# Patient Record
Sex: Male | Born: 1955 | Race: Black or African American | Hispanic: No | Marital: Single | Smoking: Former smoker
Health system: Southern US, Community
[De-identification: ages and names within clinical notes are randomized; demographics above are authoritative.]

## PROBLEM LIST (undated history)

## (undated) DIAGNOSIS — K5900 Constipation, unspecified: Secondary | ICD-10-CM

## (undated) DIAGNOSIS — N35912 Unspecified bulbous urethral stricture, male: Secondary | ICD-10-CM

## (undated) DIAGNOSIS — N189 Chronic kidney disease, unspecified: Secondary | ICD-10-CM

## (undated) DIAGNOSIS — I4891 Unspecified atrial fibrillation: Secondary | ICD-10-CM

## (undated) DIAGNOSIS — E119 Type 2 diabetes mellitus without complications: Secondary | ICD-10-CM

## (undated) DIAGNOSIS — K219 Gastro-esophageal reflux disease without esophagitis: Secondary | ICD-10-CM

## (undated) DIAGNOSIS — M545 Low back pain, unspecified: Secondary | ICD-10-CM

## (undated) DIAGNOSIS — R310 Gross hematuria: Secondary | ICD-10-CM

## (undated) DIAGNOSIS — I10 Essential (primary) hypertension: Secondary | ICD-10-CM

## (undated) DIAGNOSIS — I2510 Atherosclerotic heart disease of native coronary artery without angina pectoris: Secondary | ICD-10-CM

## (undated) DIAGNOSIS — R0600 Dyspnea, unspecified: Secondary | ICD-10-CM

## (undated) DIAGNOSIS — J449 Chronic obstructive pulmonary disease, unspecified: Secondary | ICD-10-CM

## (undated) DIAGNOSIS — D649 Anemia, unspecified: Secondary | ICD-10-CM

## (undated) DIAGNOSIS — R002 Palpitations: Secondary | ICD-10-CM

## (undated) DIAGNOSIS — E785 Hyperlipidemia, unspecified: Secondary | ICD-10-CM

## (undated) DIAGNOSIS — N401 Enlarged prostate with lower urinary tract symptoms: Secondary | ICD-10-CM

## (undated) DIAGNOSIS — F0150 Vascular dementia without behavioral disturbance: Secondary | ICD-10-CM

## (undated) DIAGNOSIS — R42 Dizziness and giddiness: Secondary | ICD-10-CM

## (undated) DIAGNOSIS — J45909 Unspecified asthma, uncomplicated: Secondary | ICD-10-CM

## (undated) DIAGNOSIS — F209 Schizophrenia, unspecified: Secondary | ICD-10-CM

## (undated) HISTORY — PX: OTHER SURGICAL HISTORY: SHX169

## (undated) HISTORY — PX: COLONOSCOPY: SHX174

---

## 2003-11-20 HISTORY — PX: CORONARY ANGIOPLASTY WITH STENT PLACEMENT: SHX49

## 2003-12-01 ENCOUNTER — Ambulatory Visit (HOSPITAL_COMMUNITY): Admission: AD | Admit: 2003-12-01 | Discharge: 2003-12-02 | Payer: Self-pay | Admitting: Cardiovascular Disease

## 2005-11-21 ENCOUNTER — Other Ambulatory Visit: Payer: Self-pay

## 2005-11-21 ENCOUNTER — Inpatient Hospital Stay: Payer: Self-pay | Admitting: Internal Medicine

## 2006-08-15 ENCOUNTER — Ambulatory Visit: Payer: Self-pay | Admitting: Cardiovascular Disease

## 2008-07-29 ENCOUNTER — Inpatient Hospital Stay: Payer: Self-pay | Admitting: Psychiatry

## 2009-04-01 ENCOUNTER — Inpatient Hospital Stay: Payer: Self-pay | Admitting: Psychiatry

## 2009-05-19 ENCOUNTER — Inpatient Hospital Stay: Payer: Self-pay | Admitting: Internal Medicine

## 2010-07-11 ENCOUNTER — Ambulatory Visit: Payer: Self-pay | Admitting: Emergency Medicine

## 2014-01-29 ENCOUNTER — Inpatient Hospital Stay: Payer: Self-pay | Admitting: Internal Medicine

## 2014-01-29 LAB — CBC WITH DIFFERENTIAL/PLATELET
Bands: 5 %
Eosinophil: 1 %
HCT: 18 % — ABNORMAL LOW (ref 40.0–52.0)
HGB: 6 g/dL — ABNORMAL LOW (ref 13.0–18.0)
Lymphocytes: 34 %
MCH: 33.7 pg (ref 26.0–34.0)
MCHC: 33.7 g/dL (ref 32.0–36.0)
MCV: 100 fL (ref 80–100)
Monocytes: 9 %
Platelet: 155 10*3/uL (ref 150–440)
RBC: 1.8 10*6/uL — ABNORMAL LOW (ref 4.40–5.90)
RDW: 16.4 % — ABNORMAL HIGH (ref 11.5–14.5)
Segmented Neutrophils: 51 %
WBC: 8.2 10*3/uL (ref 3.8–10.6)

## 2014-01-29 LAB — COMPREHENSIVE METABOLIC PANEL
Albumin: 2.7 g/dL — ABNORMAL LOW (ref 3.4–5.0)
Alkaline Phosphatase: 41 U/L — ABNORMAL LOW
Anion Gap: 4 — ABNORMAL LOW (ref 7–16)
BUN: 34 mg/dL — ABNORMAL HIGH (ref 7–18)
Bilirubin,Total: 0.3 mg/dL (ref 0.2–1.0)
Calcium, Total: 8.3 mg/dL — ABNORMAL LOW (ref 8.5–10.1)
Chloride: 106 mmol/L (ref 98–107)
Co2: 28 mmol/L (ref 21–32)
Creatinine: 1.51 mg/dL — ABNORMAL HIGH (ref 0.60–1.30)
EGFR (African American): 58 — ABNORMAL LOW
EGFR (Non-African Amer.): 50 — ABNORMAL LOW
Glucose: 156 mg/dL — ABNORMAL HIGH (ref 65–99)
Osmolality: 286 (ref 275–301)
Potassium: 4.9 mmol/L (ref 3.5–5.1)
SGOT(AST): 29 U/L (ref 15–37)
SGPT (ALT): 18 U/L (ref 12–78)
Sodium: 138 mmol/L (ref 136–145)
Total Protein: 5.7 g/dL — ABNORMAL LOW (ref 6.4–8.2)

## 2014-01-29 LAB — URINALYSIS, COMPLETE
Bacteria: NONE SEEN
Bilirubin,UR: NEGATIVE
Blood: NEGATIVE
Glucose,UR: NEGATIVE mg/dL (ref 0–75)
Ketone: NEGATIVE
Leukocyte Esterase: NEGATIVE
Nitrite: NEGATIVE
Ph: 7 (ref 4.5–8.0)
Protein: NEGATIVE
RBC,UR: NONE SEEN /HPF (ref 0–5)
Specific Gravity: 1.017 (ref 1.003–1.030)
Squamous Epithelial: <1
WBC UR: 1 /HPF (ref 0–5)

## 2014-01-29 LAB — APTT: Activated PTT: 63.7 secs — ABNORMAL HIGH (ref 23.6–35.9)

## 2014-01-29 LAB — PROTIME-INR
INR: 3.8
Prothrombin Time: 36.2 secs — ABNORMAL HIGH (ref 11.5–14.7)

## 2014-01-30 LAB — CBC WITH DIFFERENTIAL/PLATELET
Bands: 4 %
Eosinophil: 2 %
HCT: 22.5 % — ABNORMAL LOW (ref 40.0–52.0)
HGB: 7.6 g/dL — ABNORMAL LOW (ref 13.0–18.0)
Lymphocytes: 32 %
MCH: 32.5 pg (ref 26.0–34.0)
MCHC: 33.7 g/dL (ref 32.0–36.0)
MCV: 97 fL (ref 80–100)
Metamyelocyte: 1 %
Monocytes: 12 %
NRBC/100 WBC: 4 /
Platelet: 133 10*3/uL — ABNORMAL LOW (ref 150–440)
RBC: 2.33 10*6/uL — ABNORMAL LOW (ref 4.40–5.90)
RDW: 16.9 % — ABNORMAL HIGH (ref 11.5–14.5)
Segmented Neutrophils: 49 %
WBC: 7.3 10*3/uL (ref 3.8–10.6)

## 2014-01-30 LAB — BASIC METABOLIC PANEL
Anion Gap: 4 — ABNORMAL LOW (ref 7–16)
BUN: 22 mg/dL — ABNORMAL HIGH (ref 7–18)
Calcium, Total: 8.4 mg/dL — ABNORMAL LOW (ref 8.5–10.1)
Chloride: 105 mmol/L (ref 98–107)
Co2: 28 mmol/L (ref 21–32)
Creatinine: 1.09 mg/dL (ref 0.60–1.30)
EGFR (African American): >60
EGFR (Non-African Amer.): >60
Glucose: 94 mg/dL (ref 65–99)
Osmolality: 277 (ref 275–301)
Potassium: 4.7 mmol/L (ref 3.5–5.1)
Sodium: 137 mmol/L (ref 136–145)

## 2014-01-30 LAB — PROTIME-INR
INR: 1.4
Prothrombin Time: 17 secs — ABNORMAL HIGH (ref 11.5–14.7)

## 2014-01-31 LAB — CBC WITH DIFFERENTIAL/PLATELET
Bands: 1 %
Eosinophil: 2 %
HCT: 25.1 % — ABNORMAL LOW (ref 40.0–52.0)
HGB: 8.4 g/dL — ABNORMAL LOW (ref 13.0–18.0)
Lymphocytes: 43 %
MCH: 32.9 pg (ref 26.0–34.0)
MCHC: 33.4 g/dL (ref 32.0–36.0)
MCV: 98 fL (ref 80–100)
Monocytes: 10 %
NRBC/100 WBC: 2 /
Platelet: 160 10*3/uL (ref 150–440)
RBC: 2.56 10*6/uL — ABNORMAL LOW (ref 4.40–5.90)
RDW: 17.4 % — ABNORMAL HIGH (ref 11.5–14.5)
Segmented Neutrophils: 44 %
WBC: 7.6 10*3/uL (ref 3.8–10.6)

## 2014-01-31 LAB — COMPREHENSIVE METABOLIC PANEL
Albumin: 3 g/dL — ABNORMAL LOW (ref 3.4–5.0)
Alkaline Phosphatase: 39 U/L — ABNORMAL LOW
Anion Gap: 3 — ABNORMAL LOW (ref 7–16)
BUN: 11 mg/dL (ref 7–18)
Bilirubin,Total: 0.6 mg/dL (ref 0.2–1.0)
Calcium, Total: 8.6 mg/dL (ref 8.5–10.1)
Chloride: 107 mmol/L (ref 98–107)
Co2: 29 mmol/L (ref 21–32)
Creatinine: 1.09 mg/dL (ref 0.60–1.30)
EGFR (African American): >60
EGFR (Non-African Amer.): >60
Glucose: 88 mg/dL (ref 65–99)
Osmolality: 276 (ref 275–301)
Potassium: 4.8 mmol/L (ref 3.5–5.1)
SGOT(AST): 22 U/L (ref 15–37)
SGPT (ALT): 19 U/L (ref 12–78)
Sodium: 139 mmol/L (ref 136–145)
Total Protein: 6.3 g/dL — ABNORMAL LOW (ref 6.4–8.2)

## 2014-02-01 LAB — COMPREHENSIVE METABOLIC PANEL
Albumin: 2.8 g/dL — ABNORMAL LOW (ref 3.4–5.0)
Alkaline Phosphatase: 40 U/L — ABNORMAL LOW
Anion Gap: 6 — ABNORMAL LOW (ref 7–16)
BUN: 11 mg/dL (ref 7–18)
Bilirubin,Total: 0.5 mg/dL (ref 0.2–1.0)
Calcium, Total: 8.4 mg/dL — ABNORMAL LOW (ref 8.5–10.1)
Chloride: 107 mmol/L (ref 98–107)
Co2: 26 mmol/L (ref 21–32)
Creatinine: 1.32 mg/dL — ABNORMAL HIGH (ref 0.60–1.30)
EGFR (African American): >60
EGFR (Non-African Amer.): 59 — ABNORMAL LOW
Glucose: 126 mg/dL — ABNORMAL HIGH (ref 65–99)
Osmolality: 278 (ref 275–301)
Potassium: 3.9 mmol/L (ref 3.5–5.1)
SGOT(AST): 25 U/L (ref 15–37)
SGPT (ALT): 23 U/L (ref 12–78)
Sodium: 139 mmol/L (ref 136–145)
Total Protein: 6.1 g/dL — ABNORMAL LOW (ref 6.4–8.2)

## 2014-02-01 LAB — PROTIME-INR
INR: 1.1
Prothrombin Time: 14.1 secs (ref 11.5–14.7)

## 2014-02-01 LAB — CBC WITH DIFFERENTIAL/PLATELET
Basophil #: 0 10*3/uL (ref 0.0–0.1)
Basophil %: 0.4 %
Eosinophil #: 0.2 10*3/uL (ref 0.0–0.7)
Eosinophil %: 2.1 %
HCT: 30.3 % — ABNORMAL LOW (ref 40.0–52.0)
HGB: 10 g/dL — ABNORMAL LOW (ref 13.0–18.0)
Lymphocyte #: 2.3 10*3/uL (ref 1.0–3.6)
Lymphocyte %: 31.4 %
MCH: 32.7 pg (ref 26.0–34.0)
MCHC: 33.2 g/dL (ref 32.0–36.0)
MCV: 98 fL (ref 80–100)
Monocyte #: 0.9 x10 3/mm (ref 0.2–1.0)
Monocyte %: 12.4 %
Neutrophil #: 3.9 10*3/uL (ref 1.4–6.5)
Neutrophil %: 53.7 %
Platelet: 184 10*3/uL (ref 150–440)
RBC: 3.07 10*6/uL — ABNORMAL LOW (ref 4.40–5.90)
RDW: 17.4 % — ABNORMAL HIGH (ref 11.5–14.5)
WBC: 7.2 10*3/uL (ref 3.8–10.6)

## 2014-02-02 LAB — BASIC METABOLIC PANEL
Anion Gap: 4 — ABNORMAL LOW (ref 7–16)
BUN: 20 mg/dL — ABNORMAL HIGH (ref 7–18)
Calcium, Total: 8.3 mg/dL — ABNORMAL LOW (ref 8.5–10.1)
Chloride: 105 mmol/L (ref 98–107)
Co2: 28 mmol/L (ref 21–32)
Creatinine: 1.39 mg/dL — ABNORMAL HIGH (ref 0.60–1.30)
EGFR (African American): >60
EGFR (Non-African Amer.): 55 — ABNORMAL LOW
Glucose: 96 mg/dL (ref 65–99)
Osmolality: 276 (ref 275–301)
Potassium: 4.1 mmol/L (ref 3.5–5.1)
Sodium: 137 mmol/L (ref 136–145)

## 2014-02-02 LAB — CBC WITH DIFFERENTIAL/PLATELET
Basophil #: 0 10*3/uL (ref 0.0–0.1)
Basophil %: 0.3 %
Eosinophil #: 0.1 10*3/uL (ref 0.0–0.7)
Eosinophil %: 2 %
HCT: 28.1 % — ABNORMAL LOW (ref 40.0–52.0)
HGB: 9.3 g/dL — ABNORMAL LOW (ref 13.0–18.0)
Lymphocyte #: 2.2 10*3/uL (ref 1.0–3.6)
Lymphocyte %: 31.8 %
MCH: 32.8 pg (ref 26.0–34.0)
MCHC: 33.3 g/dL (ref 32.0–36.0)
MCV: 99 fL (ref 80–100)
Monocyte #: 0.8 x10 3/mm (ref 0.2–1.0)
Monocyte %: 11.8 %
Neutrophil #: 3.7 10*3/uL (ref 1.4–6.5)
Neutrophil %: 54.1 %
Platelet: 176 10*3/uL (ref 150–440)
RBC: 2.85 10*6/uL — ABNORMAL LOW (ref 4.40–5.90)
RDW: 18.1 % — ABNORMAL HIGH (ref 11.5–14.5)
WBC: 6.9 10*3/uL (ref 3.8–10.6)

## 2014-02-02 LAB — PROTIME-INR
INR: 1.1
Prothrombin Time: 14.2 secs (ref 11.5–14.7)

## 2014-02-03 LAB — CBC WITH DIFFERENTIAL/PLATELET
Basophil #: 0 10*3/uL (ref 0.0–0.1)
Basophil %: 0.4 %
Eosinophil #: 0.2 10*3/uL (ref 0.0–0.7)
Eosinophil %: 3.1 %
HCT: 25.7 % — ABNORMAL LOW (ref 40.0–52.0)
HGB: 8.5 g/dL — ABNORMAL LOW (ref 13.0–18.0)
Lymphocyte #: 2.6 10*3/uL (ref 1.0–3.6)
Lymphocyte %: 43.2 %
MCH: 32.4 pg (ref 26.0–34.0)
MCHC: 32.9 g/dL (ref 32.0–36.0)
MCV: 99 fL (ref 80–100)
Monocyte #: 0.8 x10 3/mm (ref 0.2–1.0)
Monocyte %: 13.2 %
Neutrophil #: 2.4 10*3/uL (ref 1.4–6.5)
Neutrophil %: 40.1 %
Platelet: 161 10*3/uL (ref 150–440)
RBC: 2.61 10*6/uL — ABNORMAL LOW (ref 4.40–5.90)
RDW: 18.1 % — ABNORMAL HIGH (ref 11.5–14.5)
WBC: 6 10*3/uL (ref 3.8–10.6)

## 2014-02-03 LAB — PROTIME-INR
INR: 1.3
Prothrombin Time: 16.4 secs — ABNORMAL HIGH (ref 11.5–14.7)

## 2014-02-04 LAB — CBC WITH DIFFERENTIAL/PLATELET
Basophil #: 0 10*3/uL (ref 0.0–0.1)
Basophil %: 0.6 %
Eosinophil #: 0.2 10*3/uL (ref 0.0–0.7)
Eosinophil %: 2.5 %
HCT: 31.6 % — ABNORMAL LOW (ref 40.0–52.0)
HGB: 10.4 g/dL — ABNORMAL LOW (ref 13.0–18.0)
Lymphocyte #: 3.8 10*3/uL — ABNORMAL HIGH (ref 1.0–3.6)
Lymphocyte %: 53.2 %
MCH: 32.4 pg (ref 26.0–34.0)
MCHC: 32.9 g/dL (ref 32.0–36.0)
MCV: 99 fL (ref 80–100)
Monocyte #: 0.8 x10 3/mm (ref 0.2–1.0)
Monocyte %: 10.7 %
Neutrophil #: 2.3 10*3/uL (ref 1.4–6.5)
Neutrophil %: 33 %
Platelet: 192 10*3/uL (ref 150–440)
RBC: 3.21 10*6/uL — ABNORMAL LOW (ref 4.40–5.90)
RDW: 18.2 % — ABNORMAL HIGH (ref 11.5–14.5)
WBC: 7 10*3/uL (ref 3.8–10.6)

## 2014-02-04 LAB — PROTIME-INR
INR: 1.5
Prothrombin Time: 17.4 secs — ABNORMAL HIGH (ref 11.5–14.7)

## 2014-02-05 LAB — CBC WITH DIFFERENTIAL/PLATELET
Basophil #: 0 10*3/uL (ref 0.0–0.1)
Basophil %: 0.5 %
Eosinophil #: 0.1 10*3/uL (ref 0.0–0.7)
Eosinophil %: 2.1 %
HCT: 32 % — ABNORMAL LOW (ref 40.0–52.0)
HGB: 10.5 g/dL — ABNORMAL LOW (ref 13.0–18.0)
Lymphocyte #: 3 10*3/uL (ref 1.0–3.6)
Lymphocyte %: 48.4 %
MCH: 32.6 pg (ref 26.0–34.0)
MCHC: 32.9 g/dL (ref 32.0–36.0)
MCV: 99 fL (ref 80–100)
Monocyte #: 0.7 x10 3/mm (ref 0.2–1.0)
Monocyte %: 11.1 %
Neutrophil #: 2.3 10*3/uL (ref 1.4–6.5)
Neutrophil %: 37.9 %
Platelet: 192 10*3/uL (ref 150–440)
RBC: 3.24 10*6/uL — ABNORMAL LOW (ref 4.40–5.90)
RDW: 18.2 % — ABNORMAL HIGH (ref 11.5–14.5)
WBC: 6.2 10*3/uL (ref 3.8–10.6)

## 2014-02-05 LAB — PROTIME-INR
INR: 1.4
Prothrombin Time: 17 secs — ABNORMAL HIGH (ref 11.5–14.7)

## 2014-02-08 LAB — PATHOLOGY REPORT

## 2015-02-14 ENCOUNTER — Ambulatory Visit: Payer: Self-pay | Admitting: Urology

## 2015-02-14 LAB — CBC WITH DIFFERENTIAL/PLATELET
Basophil #: 0 10*3/uL (ref 0.0–0.1)
Basophil %: 0.5 %
Eosinophil #: 0.1 10*3/uL (ref 0.0–0.7)
Eosinophil %: 2.5 %
HCT: 29.6 % — ABNORMAL LOW (ref 40.0–52.0)
HGB: 9.4 g/dL — ABNORMAL LOW (ref 13.0–18.0)
Lymphocyte #: 2.8 10*3/uL (ref 1.0–3.6)
Lymphocyte %: 48.2 %
MCH: 32.2 pg (ref 26.0–34.0)
MCHC: 31.8 g/dL — ABNORMAL LOW (ref 32.0–36.0)
MCV: 101 fL — ABNORMAL HIGH (ref 80–100)
Monocyte #: 0.5 x10 3/mm (ref 0.2–1.0)
Monocyte %: 9.3 %
Neutrophil #: 2.3 10*3/uL (ref 1.4–6.5)
Neutrophil %: 39.5 %
Platelet: 106 10*3/uL — ABNORMAL LOW (ref 150–440)
RBC: 2.92 10*6/uL — ABNORMAL LOW (ref 4.40–5.90)
RDW: 15.3 % — ABNORMAL HIGH (ref 11.5–14.5)
WBC: 5.9 10*3/uL (ref 3.8–10.6)

## 2015-02-14 LAB — BASIC METABOLIC PANEL
Anion Gap: 4 — ABNORMAL LOW (ref 7–16)
BUN: 33 mg/dL — ABNORMAL HIGH
Calcium, Total: 9 mg/dL
Chloride: 104 mmol/L
Co2: 30 mmol/L
Creatinine: 1.12 mg/dL
EGFR (African American): >60
EGFR (Non-African Amer.): >60
Glucose: 86 mg/dL
Potassium: 4.3 mmol/L
Sodium: 138 mmol/L

## 2015-02-23 ENCOUNTER — Ambulatory Visit
Admit: 2015-02-23 | Disposition: A | Discharge status: Home / Self Care | Payer: Self-pay | Attending: Urology | Admitting: Urology

## 2015-03-12 NOTE — Consult Note (Signed)
Pt seen and examined. Full consult to follow. Admitted with weakness/dizziness. Abd pain with melena and drop in hgb. Coagulapathic from coumadin. Questionable CVA but CT angiography neg. Pt given Vit K yest and INR down to 1.4. Continue to hold coumadin, give PPI daily, and moniter hgb. Since patient will eventually require reinstitution of coumadin, plan EGD on Monday AM. thanks.   Electronic Signatures: Lutricia Feilh, Paul (MD) (Signed on 14-Mar-15 09:53)  Authored   Last Updated: 14-Mar-15 09:56 by Lutricia Feilh, Paul (MD)

## 2015-03-12 NOTE — Discharge Summary (Signed)
PATIENT NAME:  Ricki MillerJONES, Jefferey H MR#:  [id number] DATE OF BIRTH:  Jul 26, 1956  DATE OF ADMISSION:  01/29/2014 DATE OF DISCHARGE:  02/05/2014  PROCEDURES:  1. Colonoscopy, Dr. Bluford Kaufmannh.  2. Upper gastrointestinal endoscopy, Dr. Bluford Kaufmannh.   CONSULTATIONS:  1. Gastroenterology, Dr. Lutricia FeilPaul Oh.  2. Cardiology, Dr. Adrian BlackwaterShaukat Khan.   IMAGING: CT scan of the abdomen, 01/29/2014.  Chest x-ray, 01/31/2014.   HISTORY: This pleasant gentleman was admitted through the Emergency Room where he presented with confusion and generalized weakness. He was found to have severe anemia due to acute blood loss, also complains of melena, abdominal pain. Please see history and physical for full details. His INR was slightly super therapeutic at 3.8. Was admitted to the medical floor and was transfused with 2 units of packed red cells, following which his hemoglobin rose appropriately and remained stable, subsequently. A gastroenterology consultation was placed with Dr. Bluford Kaufmannh, who performed upper and lower GI endoscopies, neither of which identified a source of bleeding. In the case of colonoscopy, the patient had a single polypectomy done. The patient has a history of chronic atrial fibrillation, which remained fairly well controlled during his stay here. Cardiology was consulted, namely Dr. Adrian BlackwaterShaukat Khan without any management changes made. The patient requires long-term anticoagulation for his atrial fibrillation, this was resumed following his colonoscopy, and he is discharged on Lovenox with a Coumadin bridge. The patient's stay was otherwise uncomplicated and is discharged to home in satisfactory condition.   DIET: Low-sodium, ADA diet.   ACTIVITY: As tolerated.   FOLLOWUP: One to 2 weeks with Dr. Ellsworth Lennoxejan-Sie.   DISCHARGE MEDICATIONS: Please refer to medical reconciliation that was completed and reviewed by me. Additional medications are Enoxaparin 110 mg subcutaneously every 12 hours. He is to resume his previous dose of warfarin.    DISCHARGE PROCESS TIME SPENT: 35 minutes.   ____________________________ Silas FloodSheikh A. Ellsworth Lennoxejan-Sie, MD sat:sg D: 02/23/2014 13:27:41 ET T: 02/23/2014 14:17:31 ET JOB#: [id number]  cc: Sheikh A. Ellsworth Lennoxejan-Sie, MD, <Dictator> Charlesetta GaribaldiSHEIKH A TEJAN-SIE MD ELECTRONICALLY SIGNED 03/03/2014 17:53

## 2015-03-12 NOTE — Consult Note (Signed)
PATIENT NAME:  Brian MillerJONES, Aaren H MR#:  [id number] DATE OF BIRTH:  02/27/1956  DATE OF CONSULTATION:  02/04/2014  CONSULTING PHYSICIAN:  Laurier NancyShaukat A. Khan, MD  INDICATION FOR CONSULTATION: Bradycardia.   This is a 59 year old African American male with a history of schizophrenia, coronary artery disease, diabetes, hypertension, who has been on Coumadin because of atrial fibrillation, who presented to the Emergency Room with confusion and weakness and was found to have gastrointestinal bleed. He is right now, denies any chest pain, shortness of breath, PND, orthopnea, or palpitations or fluttering. I was asked to evaluate the patient because the patient had sinus bradycardia.   PAST MEDICAL HISTORY:  1. History of atrial fibrillation.  2. Hypertension. 3. Schizophrenia.  4. History of coronary artery disease, status post PCI and stenting in 2003.  5. He also has a history of acid reflux.  6. Diabetes.   MEDICATIONS: He was on aspirin, amiodarone and Coumadin and metformin and ramipril.   ALLERGIES: None.   SOCIAL HISTORY: Unremarkable.   FAMILY HISTORY: Unremarkable except for diabetes.   PHYSICAL EXAMINATION: GENERAL: He is alert, oriented x 3, in no acute distress right now.  VITAL SIGNS: Stable.  NECK: No JVD.  LUNGS: Clear.  HEART: Regular rate and rhythm. Normal S1, S2. No audible murmur.  ABDOMEN: Soft, nontender, positive bowel sounds.   EXTREMITIES: No pedal edema.   His pulse right now is 73.   EKG shows normal sinus rhythm. No acute changes.   ASSESSMENT AND PLAN: The patient had sinus bradycardia that has resolved with decreasing Toprol or metoprolol from 50 to 25. Advise changing the amiodarone to 200 a day. also. Agree with changes made and may continue just aspirin instead of Coumadin in the future for prophylaxis of atrial fibrillation. We will follow with you. Thank you very much for the referral.   ____________________________ Laurier NancyShaukat A. Khan,  MD sak:sg D: 02/04/2014 09:40:01 ET T: 02/04/2014 10:01:37 ET JOB#: [id number]  cc: Laurier NancyShaukat A. Khan, MD, <Dictator> Laurier NancySHAUKAT A KHAN MD ELECTRONICALLY SIGNED 03/03/2014 13:56

## 2015-03-12 NOTE — Consult Note (Signed)
PATIENT NAME:  Brian Dodson, Brian Dodson MR#:  [id number] DATE OF BIRTH:  1955-12-23  DATE OF CONSULTATION:  01/29/2014  CONSULTING PHYSICIAN:  Ezzard StandingPaul Y. Oh, MD  REASON FOR REFERRAL: Weakness, significant anemia.   DESCRIPTION: The patient is a 59 year old male with a history of schizophrenia, coronary artery disease, diabetes, hypertension, who is on chronic Coumadin, he was brought in with some increasing lethargy and not feeling well and feeling dizzy. Apparently, they saw him with  possible facial droop so he was brought in for possible evaluation for stroke. Upon admission, his hemoglobin was found to be quite low with hemoglobin of only 6.0 and  his INR was elevated at 3.8. He had a CT angiography that ruled out of stroke. I was asked to see the patient because of his anemia.   The patient is a rather poor historian, it is difficult to elicit a good history. However, the patient did admit to feeling lightheaded and dizzy recently. He has been having some vague abdominal pain, although he pointed more to the lower abdomen than upper abdomen. He has noticed melena on and off for the past two weeks or so. The patient was given a blood transfusion overnight which made him feel much better.   REVIEW OF SYSTEMS: There are no fevers or chills, but there is weakness and fatigue. There are no visual or hearing changes. There is no cough or shortness of breath. There is no chest pain or palpitations. There is no nausea, vomiting, but there is abdominal pain. He was heme positive. There is no diarrhea or constipation.  The rest of the review of symptoms is negative.   PAST MEDICAL HISTORY: Notable for schizophrenia, hypertension, coronary artery disease. He had a stent placed in 2003. He has diabetes as well.   MEDICATIONS AT HOME: Coumadin every Tuesday, Thursday and Saturdays with 4 mg and 3 mg Monday, Wednesday, and Friday. He also takes risperidone, Risperdal, Ranitidine 150mg  bid, Ramipril 5 mg daily, metformin  500 mg 2 tablets twice a day, Depakote 4 times a day, hydrochlorothiazide, benztropine 1 mg daily, atorvastatin 10 mg daily, baby aspirin, amiodarone 200 mg daily.   ALLERGIES: He has no known drug allergies.   SOCIAL HISTORY: There is no tobacco or alcohol use.   FAMILY HISTORY: Notable for diabetes.   PAST SURGICAL HISTORY: Notable for cardiac stent.   PHYSICAL EXAMINATION: GENERAL: The patient is in no acute distress right now, he is alert.  HEAD AND NECK: With normal limits.  CARDIOVASCULAR: Somewhat irregular rate. No murmurs.  LUNGS: Clear bilaterally.  ABDOMEN: Shows slight tenderness in the epigastric region. He has active bowel sounds. There is no rebound or guarding. No hepatomegaly.  EXTREMITIES: No edema.  NEUROLOGIC: Examination is nonfocal.  SKIN: Examination is normal.   LABORATORY STUDIES: Again, hemoglobin 6.0, it was 7.6 this morning with blood transfusions. Liver enzymes were normal. Creatinine have been from 1.51 to 1.09 with IV hydration. Glucose is normal today. INR is down to 1.4 after the patient was given vitamin K. Urinalysis was negative.   IMPRESSION/RECOMMENDATIONS: This is a patient with coagulopathy, anemia and heme-positive stool. He has some vague abdominal pain. We have to be concerned about upper GI blood loss. The patient is relatively stable right now. There are no signs of stroke. I have given him some liquid diet today. We will plan on doing an upper endoscopy on Monday morning. Thank you for the referral.   ____________________________ Ezzard StandingPaul Y. Bluford Kaufmannh, MD pyo:sg D: 01/31/2014 08:38:55 ET  T: 01/31/2014 09:25:39 ET JOB#: [id number]  cc: Ezzard Standing. Bluford Kaufmann, MD, <Dictator> Wallace Cullens MD ELECTRONICALLY SIGNED 02/08/2014 8:37

## 2015-03-12 NOTE — H&P (Signed)
PATIENT NAME:  Brian Dodson, Brian Dodson MR#:  [id number] DATE OF BIRTH:  06-Apr-1956  DATE OF ADMISSION:  01/29/2014  PRIMARY CARE PHYSICIAN: Marland Mcalpine A. Tejan-Sie, MD  CHIEF COMPLAINT: Confusion, weakness, possible stroke.   This is a very pleasant 59 year old male with history of schizophrenia, coronary artery disease, diabetes, hypertension, on chronic anticoagulation with Coumadin, who present from [facility] with the above complaint. I actually called that the patient's sister, Rayland Hamed, for more information. Over the past few days, the patient has been feeling lethargic and going to work, but falls asleep and just not feeling well. Apparently, they found him today very weak and had some possible facial droop, so was brought here to rule out a stroke. In fact, the patient's hemoglobin is really low and upon conversing with the patient, he actually says that over the past week or so he has noticed dark-colored stools, the color of tar, and he also has abdominal pain at the midepigastric region.   REVIEW OF SYSTEMS:    CONSTITUTIONAL: No fever. Positive for weakness and fatigue.  EYES: No blurred or double vision, glaucoma or cataracts. EARS, NOSE, THROAT: No ear pain, hearing loss, seasonal allergies.  RESPIRATORY: No cough, wheezing, hemoptysis, COPD.  CARDIOVASCULAR: No chest pain, orthopnea, edema, arrhythmia, dyspnea on exertion, palpitations.  GASTROINTESTINAL: No nausea, vomiting. No diarrhea. Positive abdominal pain. Positive  melena. Positive guaiac-positive stool. GENITOURINARY: No dysuria or hematuria.  ENDOCRINE: No polyuria or polydipsia. HEMATOLOGIC AND LYMPHATIC: No anemia or easy bruising.  SKIN: No rashes or lesions.  MUSCULOSKELETAL: No limited activity.  NEUROLOGIC: No history of CVA, TIA, seizures.  PSYCHIATRIC: Positive schizophrenia.   PAST MEDICAL HISTORY:  1.  Hypertension. 2.  Schizophrenia.  3.  Coronary disease, status post stent in 2003. 4.  GERD.   5.  Diabetes.   MEDICATIONS: 1.  Coumadin 4 mg on Tuesday, Thursday, Saturday, Sunday; 3 mg on Monday, Wednesday, Friday.  2.  Tradjenta 5 mg daily.  3.  Risperidone 4 mg at bedtime.  4.  Risperdal 1 mg daily.  5.  Ranitidine 150 b.i.d.  6.  Ramipril 5 mg daily.  7.  Metformin 500, 2 tablets b.i.d.  8.  HCTZ 25 mg daily.  9.  Depakote 500 mg 4 tablets daily.  10.  Citalopram 20 mg daily.  11.  Benztropine 1 mg daily.  12.  Atorvastatin 10 mg daily.  13.  Aspirin 81 mg daily.  14.  Amiodarone 200 mg daily.   ALLERGIES: No known drug allergies.   SOCIAL HISTORY: No tobacco, alcohol or drug use.    FAMILY HISTORY: Positive for diabetes.    PAST SURGICAL HISTORY: Positive stent.   PHYSICAL EXAMINATION: Temperature 98. Pulse is 97, respirations 16, blood pressure 100/57, 100% on room air.  GENERAL: The patient is alert, oriented. Very pleasant-appearing male, just kind of smiling. HEENT: Head is atraumatic. Pupils are round and reactive. Sclerae anicteric. Mucous membranes are moist. Oropharynx is clear.  NECK: Supple without JVD, carotid bruit, or enlarged thyroid.  CARDIOVASCULAR: Regular rate and rhythm. No murmur, gallops, rubs. PMI nondisplaced. LUNGS: Clear to auscultation without crackles, rales, rhonchi, or wheezing. Normal percussion.  ABDOMEN: He has some very mild tenderness at the epigastric region without any rebound, guarding. Hard to appreciate organomegaly due to body habitus. Bowel sounds are positive in all quadrants.  EXTREMITIES: Very minimal bilateral pedal pitting edema.  NEUROLOGIC: Cranial nerves II through XII are grossly intact. No focal deficits. Cerebellar exam is normal. Finger-to-nose is  intact.  SKIN: Without any rashes or lesions.  MUSCULOSKELETAL: 5 out of 5 strength in all extremities.   LABORATORY, DIAGNOSTIC, AND RADIOLOGICAL DATA: White blood cells 8.2, hemoglobin 6, hematocrit 18; platelets are 155. INR is 3.8. Sodium 131, potassium 4.9,  chloride 106, bicarbonate 28, BUN 34, creatinine 1.51; glucose is 156, alkaline phosphatase 41, bilirubin 0.3, ALT 18. AST 29, total protein 5.7, albumin 2.7.   CT of the head showed no acute intracranial hemorrhage or CVA. Chest x-ray showed no acute cardiopulmonary disease.   EKG is normal sinus rhythm. No ST elevation or depression.   ASSESSMENT AND PLAN: This is a 59 year old male with schizophrenia, coronary artery disease, on Coumadin for unclear reason, diabetes, who presents initially from [facility] with fatigue and possible cerebrovascular accident, who is  actually found to have a hemoglobin of 6 and is complaining of melena.  1.  Gastrointestinal bleed: Likely upper source. The patient is complaining of melena for the past 2 weeks, also with abdominal pain. On physical exam, he is also tender at the midepigastric region. I suspect this is related to a duodenal ulcer or gastric ulcer. The patient is on Coumadin, so we are holding the Coumadin. Vitamin K has been given. GI has been consulted. We will Protonix 40 IV q.12 hours.  2.  Acute blood loss anemia: I have called the patient's sister, who is also power of attorney. Her name is Brian Dodson, phone number [phone]. She has consented for blood transfusion. I have reviewed the side effects, alternatives, benefits, and risks of blood transfusion, and she does want to have blood transfused if necessary. At this time it is necessary, as the patient's hemoglobin is 6 and he is having melenic stools. ER physician did guaiac his stools, which was strongly guaiac-positive.  3.  Supratherapeutic INR: His INR is 3.8. I am not sure why he is on Coumadin. I do not have any records, and the patient and the patient's family members do not know why he is on Coumadin, so vitamin K has been given. Will continue to follow the INR and obviously holding anticoagulation at this time.  4.  Hypertension: The patient's blood pressure is low, so we  will have to hold his blood pressure medications for now.  5.  History of schizophrenia: The patient is a very pleasant male. He is able to provide some history, but I do not think he is able to consent totally, so we shall his sister for any consents, Brian Coffinatsy Homer, but for now we will continue all of his outpatient medications.  6.  Diabetes: We will place the patient on sliding scale insulin.  7.  The patient is a full code status as per the patient's sister.   TIME SPENT: Approximately 40 minutes.    ____________________________ Janyth ContesSital P. Juliene PinaMody, MD spm:jcm D: 01/29/2014 17:32:03 ET T: 01/29/2014 19:42:09 ET JOB#: [id number]  cc: Sital P. Juliene PinaMody, MD, <Dictator> Silas FloodSheikh A. Ellsworth Lennoxejan-Sie, MD  Janyth ContesSITAL P MODY MD ELECTRONICALLY SIGNED 02/04/2014 21:31

## 2015-03-12 NOTE — Consult Note (Signed)
No bleeding with bowel prep. Colonoscopy showed small polyp in sigmoid colon. Polyp removed. No bleeding. Reg diet ordered. Can resume coumadin by tomorrow. Will sign off. If bleeding recurs on coumadin, then consider video capsule study as outpt. Will sign off. Thanks.  Electronic Signatures: Lutricia Feilh, Paul (MD)  (Signed on 20-Mar-15 10:44)  Authored  Last Updated: 20-Mar-15 10:44 by Lutricia Feilh, Paul (MD)

## 2015-03-12 NOTE — Consult Note (Signed)
Pt doing well. No complaints. No further bleeding. EGD was normal. Reg diet ordered. Can resume coumadin.If bleeding recurs, will consider colonoscopy later. Thanks.  Electronic Signatures: Lutricia Feilh, Paul (MD)  (Signed on 16-Mar-15 10:03)  Authored  Last Updated: 16-Mar-15 10:03 by Lutricia Feilh, Paul (MD)

## 2015-03-12 NOTE — Consult Note (Signed)
Chief Complaint:  Subjective/Chief Complaint Feeling better. Less abd pain.   VITAL SIGNS/ANCILLARY NOTES: **Vital Signs.:   15-Mar-15 08:03  Vital Signs Type Q 4hr  Temperature Temperature (F) 98.5  Celsius 36.9  Temperature Source oral  Pulse Pulse 87  Respirations Respirations 20  Systolic BP Systolic BP 109  Diastolic BP (mmHg) Diastolic BP (mmHg) 72  Mean BP 85  Pulse Ox % Pulse Ox % 97  Pulse Ox Activity Level  At rest  Oxygen Delivery Room Air/ 21 %   Brief Assessment:  GEN no acute distress   Cardiac Regular   Respiratory clear BS   Lab Results: Hepatic:  15-Mar-15 03:43   Bilirubin, Total 0.6  Alkaline Phosphatase  39 (45-117 NOTE: New Reference Range 10/09/13)  SGPT (ALT) 19  SGOT (AST) 22  Total Protein, Serum  6.3  Albumin, Serum  3.0  Routine Chem:  15-Mar-15 03:43   Result Comment NRBCS - SLIDE PREVIOUSLY REVIEWED BY PATHOLOGIST  Result(s) reported on 31 Jan 2014 at 07:25AM.  BUN 11  Creatinine (comp) 1.09  Sodium, Serum 139  Potassium, Serum 4.8  Chloride, Serum 107  CO2, Serum 29  Calcium (Total), Serum 8.6  Osmolality (calc) 276  eGFR (African American) >60  eGFR (Non-African American) >60 (eGFR values <26mL/min/1.73 m2 may be an indication of chronic kidney disease (CKD). Calculated eGFR is useful in patients with stable renal function. The eGFR calculation will not be reliable in acutely ill patients when serum creatinine is changing rapidly. It is not useful in  patients on dialysis. The eGFR calculation may not be applicable to patients at the low and high extremes of body sizes, pregnant women, and vegetarians.)  Anion Gap  3  Routine Hem:  15-Mar-15 03:43   WBC (CBC) 7.6  RBC (CBC)  2.56  Hemoglobin (CBC)  8.4  Hematocrit (CBC)  25.1  Platelet Count (CBC) 160  MCV 98  MCH 32.9  MCHC 33.4  RDW  17.4  Bands 1  Segmented Neutrophils 44  Lymphocytes 43  Monocytes 10  Eosinophil 2  NRBC 2  Diff Comment 1 ANISOCYTOSIS   Diff Comment 2 POLYCHROMASIA  Diff Comment 3 PLTS VARIED IN SIZE  Result(s) reported on 31 Jan 2014 at 07:25AM.   Assessment/Plan:  Assessment/Plan:  Assessment Abd pain. Melena. No active bleeding.   Plan EGD in AM.   Electronic Signatures: Verdie Shire (MD)  (Signed 15-Mar-15 09:26)  Authored: Chief Complaint, VITAL SIGNS/ANCILLARY NOTES, Brief Assessment, Lab Results, Assessment/Plan   Last Updated: 15-Mar-15 09:26 by Verdie Shire (MD)

## 2015-03-12 NOTE — Consult Note (Signed)
Chief Complaint:  Subjective/Chief Complaint Asked to schedule colonoscopy since hgb was drifting downwards when patient placed back on coumadin. No active bleeding. No abd pain.   VITAL SIGNS/ANCILLARY NOTES: **Vital Signs.:   19-Mar-15 09:46  Vital Signs Type Recheck  Pulse Pulse 79  Systolic BP Systolic BP 92  Diastolic BP (mmHg) Diastolic BP (mmHg) 55  Mean BP 67    10:22  Telemetry pattern Cardiac Rhythm Atrial fibrillation; pattern reported by Telemetry Clerk; HR 68   Brief Assessment:  GEN no acute distress   Cardiac Regular   Respiratory clear BS   Gastrointestinal Normal   Lab Results: Routine Coag:  18-Mar-15 05:02   Prothrombin  16.4  INR 1.3 (INR reference interval applies to patients on anticoagulant therapy. A single INR therapeutic range for coumarins is not optimal for all indications; however, the suggested range for most indications is 2.0 - 3.0. Exceptions to the INR Reference Range may include: Prosthetic heart valves, acute myocardial infarction, prevention of myocardial infarction, and combinations of aspirin and anticoagulant. The need for a higher or lower target INR must be assessed individually. Reference: The Pharmacology and Management of the Vitamin K  antagonists: the seventh ACCP Conference on Antithrombotic and Thrombolytic Therapy. Chest.2004 Sept:126 (3suppl): [id number]. A HCT value >55% may artifactually increase the PT.  In one study,  the increase was an average of 25%. Reference:  "Effect on Routine and Special Coagulation Testing Values of Citrate Anticoagulant Adjustment in Patients with High HCT Values." American Journal of Clinical Pathology 2006;126:400-405.)  19-Mar-15 04:21   Prothrombin  17.4  INR 1.5 (INR reference interval applies to patients on anticoagulant therapy. A single INR therapeutic range for coumarins is not optimal for all indications; however, the suggested range for most indications is 2.0 -  3.0. Exceptions to the INR Reference Range may include: Prosthetic heart valves, acute myocardial infarction, prevention of myocardial infarction, and combinations of aspirin and anticoagulant. The need for a higher or lower target INR must be assessed individually. Reference: The Pharmacology and Management of the Vitamin K  antagonists: the seventh ACCP Conference on Antithrombotic and Thrombolytic Therapy. Chest.2004 Sept:126 (3suppl): [id number]. A HCT value >55% may artifactually increase the PT.  In one study,  the increase was an average of 25%. Reference:  "Effect on Routine and Special Coagulation Testing Values of Citrate Anticoagulant Adjustment in Patients with High HCT Values." American Journal of Clinical Pathology 2006;126:400-405.)  Routine Hem:  18-Mar-15 05:02   WBC (CBC) 6.0  RBC (CBC)  2.61  Hemoglobin (CBC)  8.5  Hematocrit (CBC)  25.7  Platelet Count (CBC) 161  MCV 99  MCH 32.4  MCHC 32.9  RDW  18.1  Neutrophil % 40.1  Lymphocyte % 43.2  Monocyte % 13.2  Eosinophil % 3.1  Basophil % 0.4  Neutrophil # 2.4  Lymphocyte # 2.6  Monocyte # 0.8  Eosinophil # 0.2  Basophil # 0.0 (Result(s) reported on 03 Feb 2014 at [facility]06:09AM.)  19-Mar-15 04:21   WBC (CBC) 7.0  RBC (CBC)  3.21  Hemoglobin (CBC)  10.4  Hematocrit (CBC)  31.6  Platelet Count (CBC) 192  MCV 99  MCH 32.4  MCHC 32.9  RDW  18.2  Neutrophil % 33.0  Lymphocyte % 53.2  Monocyte % 10.7  Eosinophil % 2.5  Basophil % 0.6  Neutrophil # 2.3  Lymphocyte #  3.8  Monocyte # 0.8  Eosinophil # 0.2  Basophil # 0.0 (Result(s) reported on 04 Feb 2014 at 05:46AM.)   Assessment/Plan:  Assessment/Plan:  Assessment Anemia. Neg EGD.   Plan Agree with scheduling colonoscopy. Coumadin stopped. Hold lovenox in AM. Bowel prep later today. Thanks.   Electronic Signatures: Lutricia Feil (MD)  (Signed 19-Mar-15 10:39)  Authored: Chief Complaint, VITAL SIGNS/ANCILLARY NOTES, Brief Assessment, Lab Results,  Assessment/Plan   Last Updated: 19-Mar-15 10:39 by Lutricia Feil (MD)

## 2015-03-14 LAB — SURGICAL PATHOLOGY

## 2015-03-20 NOTE — Op Note (Signed)
PATIENT NAME:  Brian Dodson, Brian Dodson MR#:  [id number] DATE OF BIRTH:  01-Jan-1956  DATE OF PROCEDURE:  02/23/2015  PREOPERATIVE DIAGNOSIS: Urethral stricture, hematuria.   POSTOPERATIVE DIAGNOSIS: Urethral stricture, hematuria.   PROCEDURE PERFORMED: Direct visual internal urethrotomy, cystoscopy, bilateral retrograde pyelogram, urethral biopsy, placement of Foley catheter.   ATTENDING SURGEON: Claris Gladden, MD   ANESTHESIA: General anesthesia.   ESTIMATED BLOOD LOSS: Minimal.   DRAINS: None.   COMPLICATIONS: None.   SPECIMENS: Urethral biopsy.   INDICATION: This is a 59 year old male who presented for evaluation of gross hematuria and a thickened bladder wall noted on CT scan. He underwent further workup in the office, which revealed a fairly tight bulbar urethral stricture; therefore, he was counseled to undergo dilation of the stricture, cystoscopy, and bilateral retrograde to complete his hematuria workup. Risks and risks of the procedure were explained in detail. The patient agreed to proceed as planned.   PROCEDURE: The patient was correctly identified in the preoperative holding area and informed consent was obtained. He was brought to the operating suite and placed on the table in supine position. At this time universal timeout protocol was performed. All team members were identified. Venodyne boots were placed and he was administered 500 mg of IV Levaquin in the perioperative period. He was then placed under general anesthesia, repositioned lower on the bed in the dorsal lithotomy position, and prepped and draped in standard surgical fashion. At this point in time, a 57 French urethrotome rigid cystoscope using a cold knife and a 0 degrees lens was advanced per urethra until the proximal bulb at which time a fairly tight bulbar urethral stricture was encountered. The cold knife was used to incise at the 12 o'clock position opening this fairly wispy stricture.  I was able to easily pass  beyond this, and at this time, a second more narrow, more dense-appearing stricture was encountered.  This stricture was also incised using a similar technique using the cold knife. Unfortunately, the stricture had very different quality. It was much thicker, and had almost fuzzy, hazy surface. This was somewhat concerning for possible malignant stricture. I then advanced a Sensor wire through this area up into the bladder, confirmed under fluoroscopy. I was eventually able to pass the scope beyond this area of the stricture. Length itself was approximately 1 cm in length The prostatic urethra appeared relatively short and dilated. The scope was then exchanged for normal 22 French rigid cystoscope which was able to be passed beyond the stricture down to the bladder without difficulty. The bladder was then carefully surveilled using both the 30 degree and 70 degree lenses. There was some mild debris noted within the bladder, but no lesions on the mucosal surface.  The trigone was evaluated and bilateral ureteral orifices were able to be identified with clear efflux of urine from both. There was mild trabeculation throughout the bladder.  Attention was then turned to the left ureteral orifice, which was cannulated using a 5 [place] open-ended ureteral catheter. A retrograde pyelogram was then performed revealing a relatively delicate-appearing ureter and normal upper tract collecting system without evidence of filling defects or hydronephrosis. Attention was then turned to the contralateral side on the right. The UO was cannulated using a 5 [place] open-ended ureteral catheter again, just within the UO. Again, the ureter was delicate and there were no filling defects or hydronephrosis. The scope was then backed to the level of the urethral stricture and cold cup biopsy forceps were used to biopsy  2 very small areas of the stricture to ensure that this did not represent a malignant stricture. There was minimal bleeding  noted from these biopsy sites.   The scope was then removed. The specimens were passed off in formalin. An 1618 French [place]-tip catheter was advanced over the wire which had been previously placed into the bladder without difficulty. The wire was removed. The balloon was then filled with 10 mL of sterile saline. The bladder was able to drain through the catheter with no problems. The urine was relatively clear. The patient was then repositioned in supine position, reversed from anesthesia, and taken to the PACU in stable condition. There were no complications in this case.   ____________________________ Claris GladdenAshley J. Brandon, MD ajb:LT D: 02/23/2015 15:36:44 ET T: 02/23/2015 21:00:47 ET JOB#: [id number]  cc: Claris GladdenAshley J. Brandon, MD, <Dictator> Claris GladdenASHLEY J BRANDON MD ELECTRONICALLY SIGNED 03/01/2015 18:16

## 2015-05-09 IMAGING — CT CT ANGIOGRAPHY HEAD
3 of 9 series · 16 of 47 positions shown · IV contrast (agent unspecified)
Comparison: None available.

CLINICAL DATA: Sudden onset of facial droop.

EXAM:
CT ANGIOGRAPHY HEAD
TECHNIQUE: Multidetector CT imaging of the head was performed using the
standard protocol during bolus administration of intravenous
contrast. Multiplanar CT image reconstructions and MIPs were
obtained to evaluate the vascular anatomy.
CONTRAST:  Eighty mellitus view 370

[Series 7: cta head · axial · 0.44mm/px · z∈[-46,+82]mm · 10 of 157 slices shown]
[im 15/157  brain]
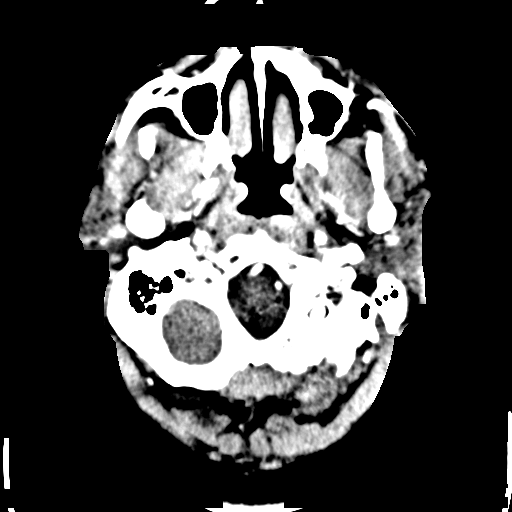
[im 29/157  bone]
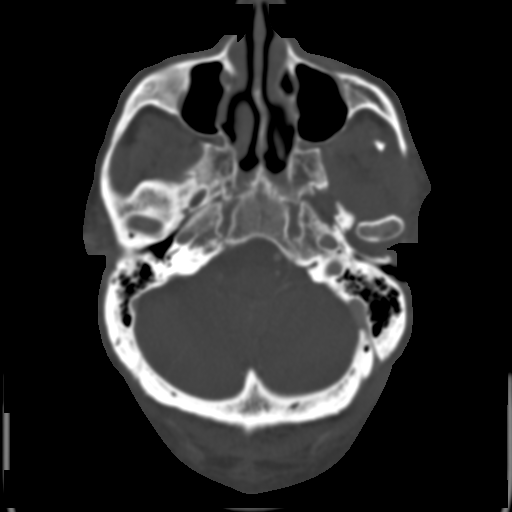
[im 43/157  brain]
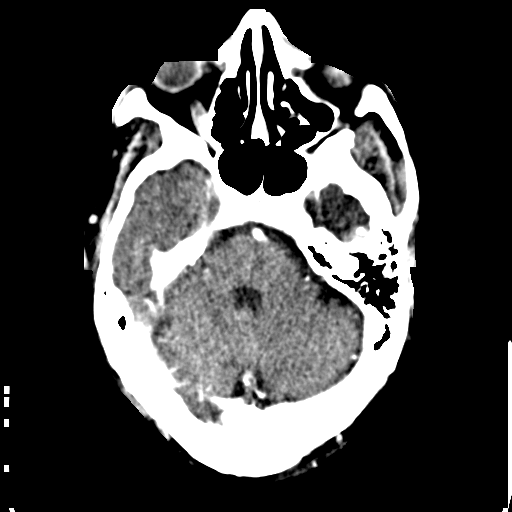
[im 57/157  bone]
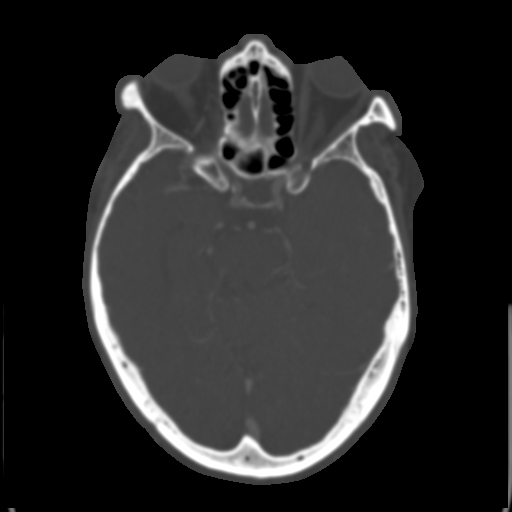
[im 71/157  brain]
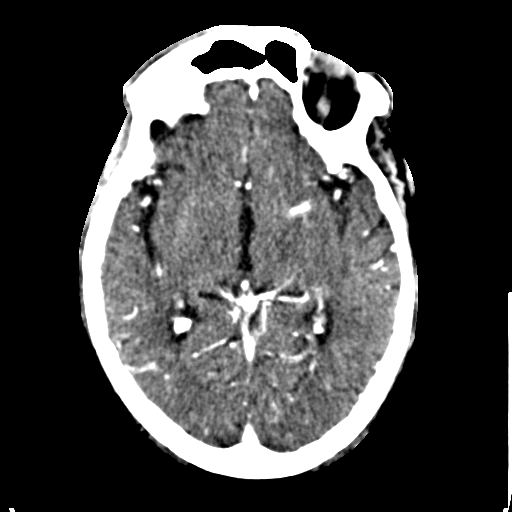
[im 86/157  bone]
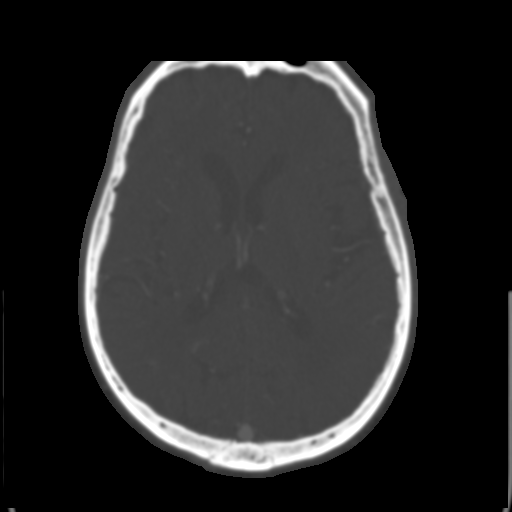
[im 100/157  brain]
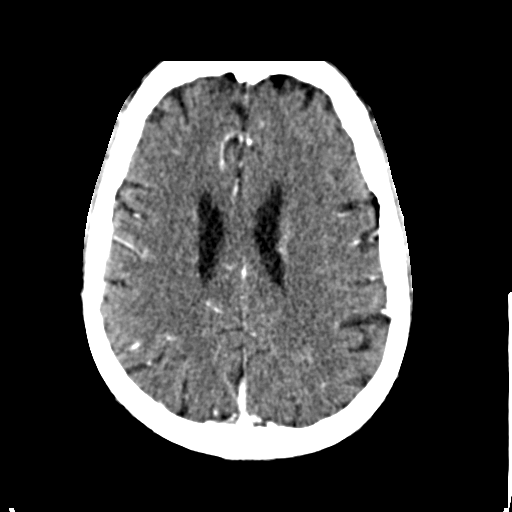
[im 114/157  bone]
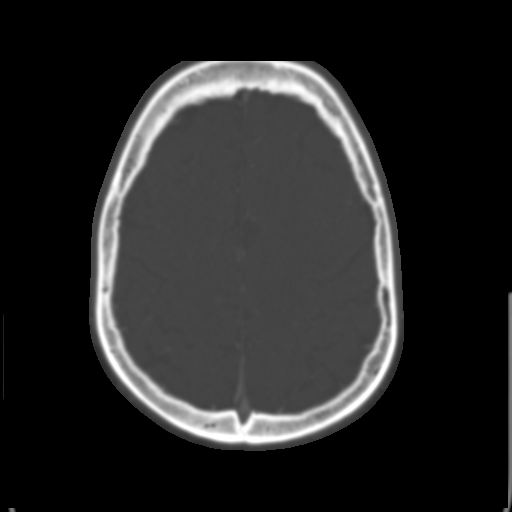
[im 128/157  brain]
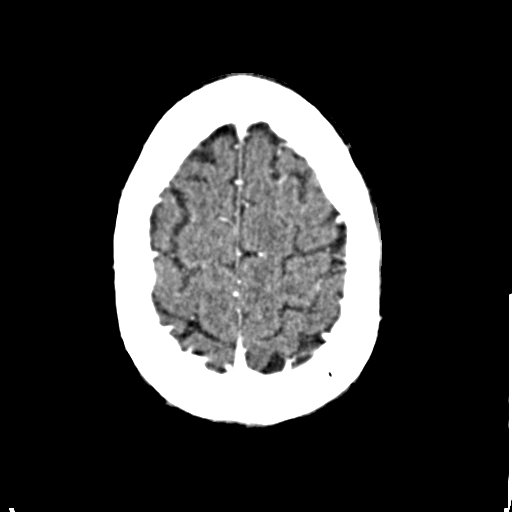
[im 142/157  bone]
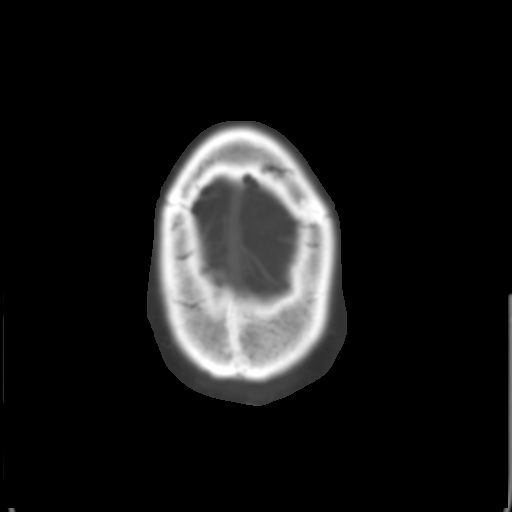

[Series 10: cor thin · coronal · 0.31mm/px · 3 of 219 slices shown]
[im 63/219  brain]
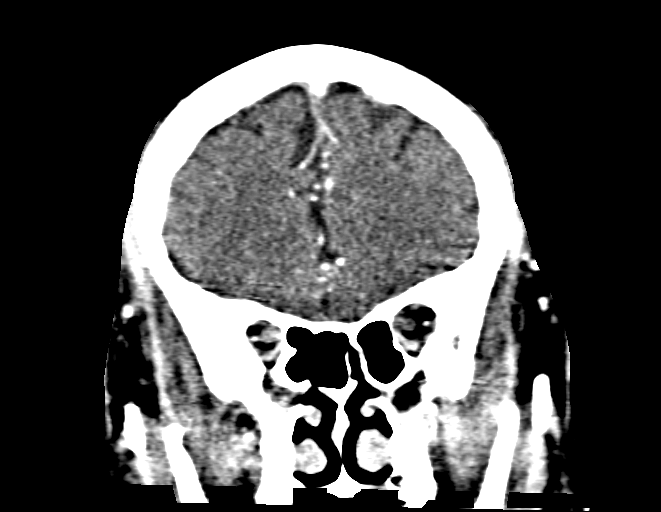
[im 94/219  brain]
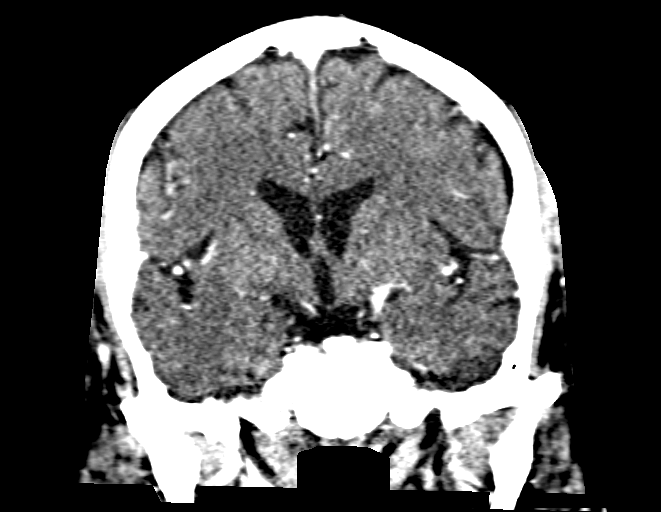
[im 125/219  brain]
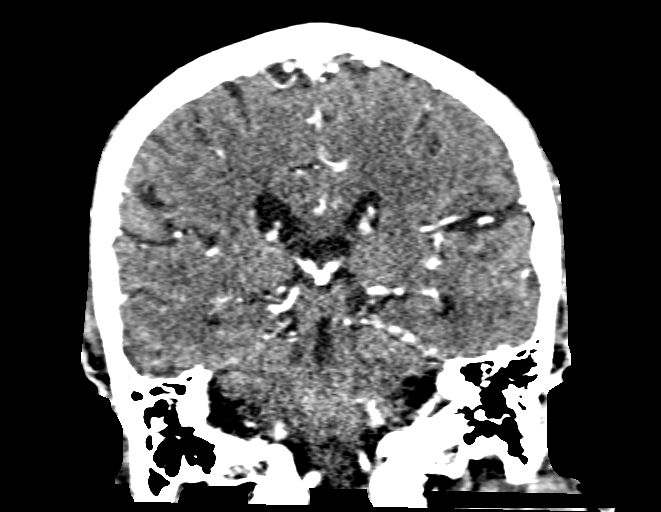

[Series 12: sag thin · sagittal · 0.31mm/px · 3 of 162 slices shown]
[im 33/162  brain]
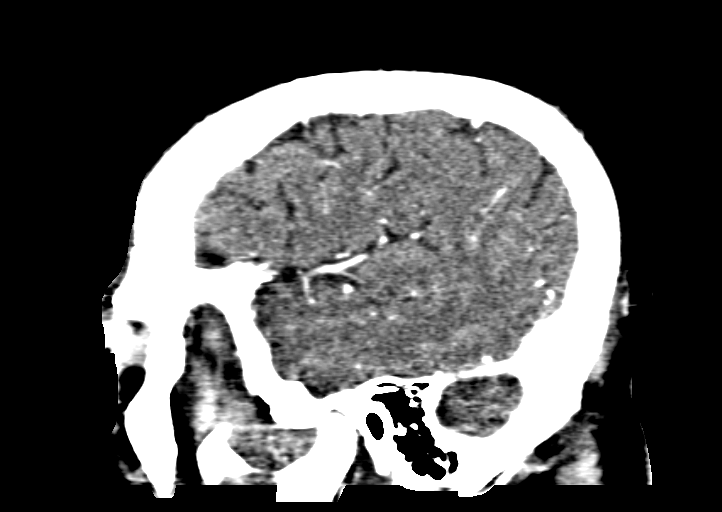
[im 65/162  brain]
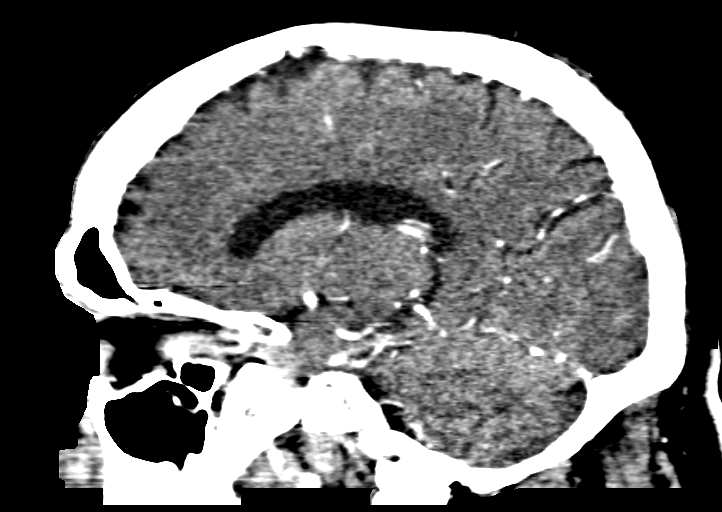
[im 97/162  brain]
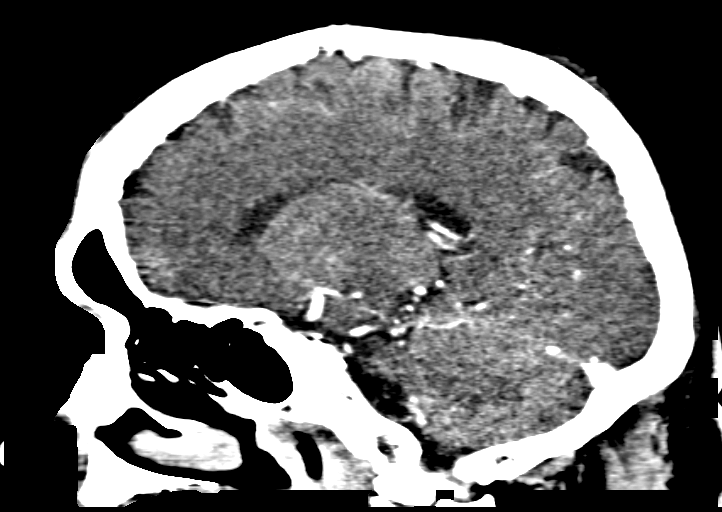

[16 of 47 positions shown; findings below may reference images not displayed]

FINDINGS: No acute cortical infarct, hemorrhage, or mass lesion is present.
The source images demonstrate no focal areas of gray-white
differentiation loss. The basal ganglia are intact.

Mild venous contamination is present. There is some atherosclerotic
disease within the cavernous carotid arteries bilaterally. Mild
smooth narrowing is evident in the supra clinoid segment on the
right, less than 50%. The A1 and M1 segments are normal. The
anterior communicating artery is patent.

The MCA bifurcations are intact. There is some attenuation of MCA
branch vessels bilaterally.

The right vertebral artery is the dominant vessel. Prominent AICA
vessels are evident bilaterally. The basilar artery is within normal
limits. Both posterior cerebral arteries originate from the basilar
tip.

No enhancing mass lesions are present. The dural sinuses are patent.
The left transverse sinus is dominant.

Review of the MIP images confirms the above findings.
IMPRESSION: 1. No evidence for acute or subacute infarction.
2. Mild atherosclerotic changes within the cavernous carotid
arteries bilaterally without significant stenosis.
3. Mild distal small vessel disease.

## 2015-05-23 ENCOUNTER — Encounter: Payer: Self-pay | Admitting: Emergency Medicine

## 2015-05-23 ENCOUNTER — Observation Stay
Admission: EM | Admit: 2015-05-23 | Discharge: 2015-05-24 | Disposition: A | Discharge status: Home / Self Care | Payer: Medicare Other | Attending: Internal Medicine | Admitting: Internal Medicine

## 2015-05-23 ENCOUNTER — Emergency Department: Payer: Medicare Other

## 2015-05-23 DIAGNOSIS — Z7982 Long term (current) use of aspirin: Secondary | ICD-10-CM | POA: Insufficient documentation

## 2015-05-23 DIAGNOSIS — R4182 Altered mental status, unspecified: Secondary | ICD-10-CM | POA: Diagnosis not present

## 2015-05-23 DIAGNOSIS — I4891 Unspecified atrial fibrillation: Principal | ICD-10-CM

## 2015-05-23 DIAGNOSIS — I959 Hypotension, unspecified: Secondary | ICD-10-CM | POA: Insufficient documentation

## 2015-05-23 DIAGNOSIS — F209 Schizophrenia, unspecified: Secondary | ICD-10-CM | POA: Diagnosis not present

## 2015-05-23 DIAGNOSIS — R531 Weakness: Secondary | ICD-10-CM | POA: Diagnosis present

## 2015-05-23 DIAGNOSIS — R55 Syncope and collapse: Secondary | ICD-10-CM | POA: Diagnosis present

## 2015-05-23 DIAGNOSIS — I10 Essential (primary) hypertension: Secondary | ICD-10-CM | POA: Insufficient documentation

## 2015-05-23 DIAGNOSIS — I2510 Atherosclerotic heart disease of native coronary artery without angina pectoris: Secondary | ICD-10-CM | POA: Diagnosis not present

## 2015-05-23 DIAGNOSIS — R42 Dizziness and giddiness: Secondary | ICD-10-CM | POA: Diagnosis not present

## 2015-05-23 DIAGNOSIS — Z87891 Personal history of nicotine dependence: Secondary | ICD-10-CM | POA: Diagnosis not present

## 2015-05-23 DIAGNOSIS — Z79899 Other long term (current) drug therapy: Secondary | ICD-10-CM | POA: Diagnosis not present

## 2015-05-23 DIAGNOSIS — Z794 Long term (current) use of insulin: Secondary | ICD-10-CM | POA: Diagnosis not present

## 2015-05-23 DIAGNOSIS — E119 Type 2 diabetes mellitus without complications: Secondary | ICD-10-CM | POA: Diagnosis not present

## 2015-05-23 DIAGNOSIS — K219 Gastro-esophageal reflux disease without esophagitis: Secondary | ICD-10-CM | POA: Diagnosis not present

## 2015-05-23 DIAGNOSIS — I4892 Unspecified atrial flutter: Secondary | ICD-10-CM | POA: Insufficient documentation

## 2015-05-23 DIAGNOSIS — N179 Acute kidney failure, unspecified: Secondary | ICD-10-CM | POA: Diagnosis not present

## 2015-05-23 DIAGNOSIS — Z955 Presence of coronary angioplasty implant and graft: Secondary | ICD-10-CM | POA: Insufficient documentation

## 2015-05-23 DIAGNOSIS — I480 Paroxysmal atrial fibrillation: Secondary | ICD-10-CM | POA: Diagnosis present

## 2015-05-23 HISTORY — DX: Atherosclerotic heart disease of native coronary artery without angina pectoris: I25.10

## 2015-05-23 HISTORY — DX: Schizophrenia, unspecified: F20.9

## 2015-05-23 HISTORY — DX: Essential (primary) hypertension: I10

## 2015-05-23 HISTORY — DX: Gastro-esophageal reflux disease without esophagitis: K21.9

## 2015-05-23 HISTORY — DX: Type 2 diabetes mellitus without complications: E11.9

## 2015-05-23 LAB — COMPREHENSIVE METABOLIC PANEL
ALT: 13 U/L — ABNORMAL LOW (ref 17–63)
AST: 25 U/L (ref 15–41)
Albumin: 3.2 g/dL — ABNORMAL LOW (ref 3.5–5.0)
Alkaline Phosphatase: 45 U/L (ref 38–126)
Anion gap: 8 (ref 5–15)
BUN: 25 mg/dL — ABNORMAL HIGH (ref 6–20)
CO2: 25 mmol/L (ref 22–32)
Calcium: 8.6 mg/dL — ABNORMAL LOW (ref 8.9–10.3)
Chloride: 107 mmol/L (ref 101–111)
Creatinine, Ser: 1.55 mg/dL — ABNORMAL HIGH (ref 0.61–1.24)
GFR calc Af Amer: 55 mL/min — ABNORMAL LOW (ref >60)
GFR calc non Af Amer: 47 mL/min — ABNORMAL LOW (ref >60)
Glucose, Bld: 164 mg/dL — ABNORMAL HIGH (ref 65–99)
Potassium: 4 mmol/L (ref 3.5–5.1)
Sodium: 140 mmol/L (ref 135–145)
Total Bilirubin: 0.5 mg/dL (ref 0.3–1.2)
Total Protein: 6.1 g/dL — ABNORMAL LOW (ref 6.5–8.1)

## 2015-05-23 LAB — CBC
HCT: 33 % — ABNORMAL LOW (ref 40.0–52.0)
Hemoglobin: 10.8 g/dL — ABNORMAL LOW (ref 13.0–18.0)
MCH: 32.5 pg (ref 26.0–34.0)
MCHC: 32.9 g/dL (ref 32.0–36.0)
MCV: 98.8 fL (ref 80.0–100.0)
Platelets: 129 10*3/uL — ABNORMAL LOW (ref 150–440)
RBC: 3.34 MIL/uL — ABNORMAL LOW (ref 4.40–5.90)
RDW: 14.7 % — ABNORMAL HIGH (ref 11.5–14.5)
WBC: 5 10*3/uL (ref 3.8–10.6)

## 2015-05-23 LAB — GLUCOSE, CAPILLARY: Glucose-Capillary: 139 mg/dL — ABNORMAL HIGH (ref 65–99)

## 2015-05-23 LAB — URINE DRUG SCREEN, QUALITATIVE (ARMC ONLY)
Amphetamines, Ur Screen: POSITIVE — AB
Barbiturates, Ur Screen: NOT DETECTED
Benzodiazepine, Ur Scrn: NOT DETECTED
Cannabinoid 50 Ng, Ur ~~LOC~~: NOT DETECTED
Cocaine Metabolite,Ur ~~LOC~~: NOT DETECTED
MDMA (Ecstasy)Ur Screen: NOT DETECTED
Methadone Scn, Ur: NOT DETECTED
Opiate, Ur Screen: NOT DETECTED
Phencyclidine (PCP) Ur S: NOT DETECTED
Tricyclic, Ur Screen: NOT DETECTED

## 2015-05-23 LAB — TROPONIN I: Troponin I: <0.03 ng/mL (ref <0.031)

## 2015-05-23 LAB — TSH: TSH: 0.933 u[IU]/mL (ref 0.350–4.500)

## 2015-05-23 MED ORDER — ASPIRIN EC 81 MG PO TBEC
81.0000 mg | DELAYED_RELEASE_TABLET | Freq: Every day | ORAL | Status: DC
Start: 2015-05-23 — End: 2015-05-24
  Administered 2015-05-23 – 2015-05-24 (×2): 81 mg via ORAL
  Filled 2015-05-23 (×2): qty 1

## 2015-05-23 MED ORDER — PANTOPRAZOLE SODIUM 40 MG PO TBEC
40.0000 mg | DELAYED_RELEASE_TABLET | Freq: Every day | ORAL | Status: DC
  Administered 2015-05-23 – 2015-05-24 (×2): 40 mg via ORAL
  Filled 2015-05-23 (×2): qty 1

## 2015-05-23 MED ORDER — RISPERIDONE 0.5 MG PO TABS
1.0000 mg | ORAL_TABLET | Freq: Every day | ORAL | Status: DC
  Administered 2015-05-23: 1 mg via ORAL
  Filled 2015-05-23: qty 2

## 2015-05-23 MED ORDER — APIXABAN 5 MG PO TABS
5.0000 mg | ORAL_TABLET | Freq: Two times a day (BID) | ORAL | Status: DC
  Administered 2015-05-23 – 2015-05-24 (×2): 5 mg via ORAL
  Filled 2015-05-23 (×2): qty 1

## 2015-05-23 MED ORDER — DIVALPROEX SODIUM ER 500 MG PO TB24
2000.0000 mg | ORAL_TABLET | Freq: Every day | ORAL | Status: DC
  Administered 2015-05-23: 2000 mg via ORAL
  Filled 2015-05-23 (×2): qty 4

## 2015-05-23 MED ORDER — BENZTROPINE MESYLATE 1 MG PO TABS
1.0000 mg | ORAL_TABLET | Freq: Every day | ORAL | Status: DC
  Administered 2015-05-23: 1 mg via ORAL
  Filled 2015-05-23 (×2): qty 1

## 2015-05-23 MED ORDER — CITALOPRAM HYDROBROMIDE 20 MG PO TABS
20.0000 mg | ORAL_TABLET | Freq: Every day | ORAL | Status: DC
  Administered 2015-05-23: 20 mg via ORAL
  Filled 2015-05-23: qty 1

## 2015-05-23 MED ORDER — ACETAMINOPHEN 650 MG RE SUPP: 650.0000 mg | Freq: Four times a day (QID) | RECTAL | Status: DC | PRN

## 2015-05-23 MED ORDER — AMIODARONE HCL 200 MG PO TABS
200.0000 mg | ORAL_TABLET | Freq: Every day | ORAL | Status: DC
  Administered 2015-05-23 – 2015-05-24 (×2): 200 mg via ORAL
  Filled 2015-05-23 (×2): qty 1

## 2015-05-23 MED ORDER — DILTIAZEM HCL 30 MG PO TABS
ORAL_TABLET | ORAL | Status: AC
  Administered 2015-05-23: 30 mg via ORAL
  Filled 2015-05-23: qty 1

## 2015-05-23 MED ORDER — SODIUM CHLORIDE 0.9 % IJ SOLN
3.0000 mL | Freq: Two times a day (BID) | INTRAMUSCULAR | Status: DC
  Administered 2015-05-23: 3 mL via INTRAVENOUS

## 2015-05-23 MED ORDER — ATORVASTATIN CALCIUM 10 MG PO TABS
10.0000 mg | ORAL_TABLET | Freq: Every day | ORAL | Status: DC
  Administered 2015-05-24: 10 mg via ORAL
  Filled 2015-05-23: qty 1

## 2015-05-23 MED ORDER — METOPROLOL SUCCINATE ER 50 MG PO TB24
50.0000 mg | ORAL_TABLET | Freq: Every day | ORAL | Status: DC
  Administered 2015-05-24: 50 mg via ORAL
  Filled 2015-05-23: qty 1

## 2015-05-23 MED ORDER — INSULIN ASPART 100 UNIT/ML ~~LOC~~ SOLN: 0.0000 [IU] | Freq: Every day | SUBCUTANEOUS | Status: DC

## 2015-05-23 MED ORDER — DILTIAZEM HCL 30 MG PO TABS
30.0000 mg | ORAL_TABLET | Freq: Once | ORAL | Status: AC
  Administered 2015-05-23: 30 mg via ORAL

## 2015-05-23 MED ORDER — ACETAMINOPHEN 325 MG PO TABS: 650.0000 mg | ORAL_TABLET | Freq: Four times a day (QID) | ORAL | Status: DC | PRN

## 2015-05-23 MED ORDER — SODIUM CHLORIDE 0.9 % IV SOLN
INTRAVENOUS | Status: DC
  Administered 2015-05-23: 22:00:00 via INTRAVENOUS

## 2015-05-23 MED ORDER — RISPERIDONE 1 MG PO TABS
4.0000 mg | ORAL_TABLET | Freq: Every day | ORAL | Status: DC
  Administered 2015-05-23: 4 mg via ORAL
  Filled 2015-05-23 (×2): qty 4

## 2015-05-23 MED ORDER — INSULIN ASPART 100 UNIT/ML ~~LOC~~ SOLN: 0.0000 [IU] | Freq: Three times a day (TID) | SUBCUTANEOUS | Status: DC

## 2015-05-23 NOTE — ED Notes (Signed)
Pt comes in via EMS for the original call of uncontrolled diabetes and a fall.  Patient was found on the floor upon arrival.  Patient states he did not fall but that he decided to lay on the floor. There was arguing occuring in the household between the patient and his sister.  Patient has h/o schizophrenia, diabetes, HTN.  CBG 156.  Patient had a-fib on his ekg ranging from 90's-140s.  Patients original BP 80/50.

## 2015-05-23 NOTE — Progress Notes (Signed)
Pt sates he has not eat since 1200 pm. Glucose of 60 at time of arrival to the unit. [place] sandwich and orange juice provided. Will continue to monitor.

## 2015-05-23 NOTE — ED Provider Notes (Signed)
[facility] Emergency Department Provider Note  ____________________________________________  Time seen: 1622  I have reviewed the triage vital signs and the nursing notes.   HISTORY  Chief Complaint Altered Mental Status  syncope, weakness    HPI Brian Dodson is a 59 y.o. male with a history of schizophrenia, diabetes, and hypertension. He has been brought to the emergency department because he has been feeling weak. There was a report that he had fallen. He denies having fallen. Instead he says he eats himself to the ground because he felt weak. Once on the ground he fell asleep. He reports he then woke up and thought he was waking up from a nightmare.  The patient is very pleasant and communicative, but with an onset and some plastic affect. He does have a history of schizophrenia. Some of the history may be questionable. I interpret what he has told me about using himself down to the floor and falling asleep as true syncope.   Apparently the patient was arguing with a family member when EMS arrived. EMS noted that the patient was in A. fib with a heart rate of between the 90s and 140s and a low blood pressure at 80/50. The patient denies being in A. fib previously, although not sure he completely understands that diagnosis.  The patient reports this weakness has been occurring for the past day or so. He denies any chest pain, shortness of breath, or nausea.    Past Medical History  Diagnosis Date  . Schizophrenia   . Diabetes mellitus without complication   . Hypertension     There are no active problems to display for this patient.   Past Surgical History  Procedure Laterality Date  . Colonoscopy      No current outpatient prescriptions on file.  Allergies Review of patient's allergies indicates no known allergies.  No family history on file.  Social History History  Substance Use Topics  . Smoking status: Former Games developer  . Smokeless  tobacco: Not on file  . Alcohol Use: No    Review of Systems  Constitutional: Negative for fever. Positive for general weakness. See history of present illness ENT: Negative for sore throat. Cardiovascular: Negative for chest pain. Positive for syncope. See history of present illness Respiratory: Negative for shortness of breath. Gastrointestinal: Negative for abdominal pain, vomiting and diarrhea. Genitourinary: Negative for dysuria. Musculoskeletal: No myalgias or injuries. Skin: Negative for rash. Neurological: Negative for headaches   10-point ROS otherwise negative.  ____________________________________________   PHYSICAL EXAM:  VITAL SIGNS: ED Triage Vitals  Enc Vitals Group     BP 05/23/15 1612 98/72 mmHg     Pulse Rate 05/23/15 1612 102     Resp 05/23/15 1612 14     Temp 05/23/15 1612 98.9 F (37.2 C)     Temp Source 05/23/15 1612 Oral     SpO2 05/23/15 1612 97 %     Weight 05/23/15 1612 217 lb (98.431 kg)     Height 05/23/15 1612  (1.854 m)     Head Cir --      Peak Flow --      Pain Score --      Pain Loc --      Pain Edu? --      Excl. in GC? --     Constitutional:  Alert and communicative in no acute distress. ENT   Head: Normocephalic and atraumatic.   Nose: No congestion/rhinnorhea. Cardiovascular: Heart rate between 90 and  120 on the monitor during my exam, irregular rhythm, no murmur noted on auscultation. Respiratory:  Normal respiratory effort, no tachypnea.    Breath sounds are clear and equal bilaterally.  Gastrointestinal: Soft and nontender. No distention.  Back: No muscle spasm, no tenderness, no CVA tenderness. Musculoskeletal: No deformity noted. Nontender with normal range of motion in all extremities.  No noted edema. Neurologic:  Normal speech and language. No gross focal neurologic deficits are appreciated.  Skin:  Skin is warm, dry. No rash noted. Psychiatric: Mood and affect are normal. Speech and behavior are normal.   ____________________________________________    LABS (pertinent positives/negatives)  CBC: Hemoglobin of 10.8, platelets 129, white blood cell count of 5000 Metabolic panel: Glucose 164, BUN 25 creatinine 1.5 Troponin pending TSH pending Urine drug screen: Positive for amphetamines  ____________________________________________   EKG  ED ECG REPORT I, KAMINSKI,DAVID W, the attending physician, personally viewed and interpreted this ECG.   Date: 05/23/2015  EKG Time: 1614  Rate: 1:15  Rhythm: Atrial fibrillation with rapid ventricular response  Axis: Normal  Intervals: QTC slightly prolonged at 492  ST&T Change: Nonspecific T wave abnormality  ____________________________________________    RADIOLOGY  Chest x-ray No active cardiopulmonary disease  ____________________________________________   ____________________________________________   INITIAL IMPRESSION / ASSESSMENT AND PLAN / ED COURSE  Pertinent labs & imaging results that were available during my care of the patient were reviewed by me and considered in my medical decision making (see chart for details).  Pleasant patient with a history of schizophrenia and some limitation in cognition. He likely has had syncopal episodes. I think this is due to new onset atrial fibrillation. An EKG from January 2016 showed normal sinus rhythm. There is no comment of him having A. fib before and the patient does not think he's had this either.  His blood pressure improved in the emergency department. To help with rate control I will treat him with diltiazem by mouth, 30 mg. I will speak with internal medicine to admit the patient for further evaluation of this new onset atrial fibrillation.  ----------------------------------------- 6:46 PM on 05/23/2015 -----------------------------------------  Troponin and TSH are still pending. I discussed the case with internal medicine (Dr. Anne HahnWillis) for admission to the hospital for  new onset atrial fibrillation.  ____________________________________________   FINAL CLINICAL IMPRESSION(S) / ED DIAGNOSES  Final diagnoses:  Atrial fibrillation, new onset  Syncope and collapse  Weakness      Darien Ramusavid W Kaminski, MD 05/23/15 [phone]

## 2015-05-23 NOTE — H&P (Signed)
[facility] [place] [facility]   PATIENT NAME: Brian Dodson    MR#:  [id number]  DATE OF BIRTH:  22-Aug-1956  DATE OF ADMISSION:  05/23/2015  PRIMARY CARE PHYSICIAN: Luna Fuse, MD   REQUESTING/REFERRING PHYSICIAN: Carollee Massed  CHIEF COMPLAINT:   Chief Complaint  Patient presents with  . Altered Mental Status    HISTORY OF PRESENT ILLNESS:  Brian Dodson  is a 59 y.o. male who presents with atrial fibrillation with RVR. History is difficult to obtain from patient, as he is unable to provide a number of details. Apparently though, he has been having some syncopal episodes, or some presyncopal episodes recently with fall reported by his sister. Patient also states that sometimes his heart feels funny. He denies any overt chest pain or shortness of breath. He denies any outright palpitations. In the ED he was found to be in atrial fibrillation with RVR. He was given a dose of by mouth Cardizem with good rate control. Hospitalists were called for evaluation for his atrial fibrillation, which is unclear if this is a new diagnosis or not, and his potentially associated syncopal or presyncopal episodes. Patient states that he has a cardiologist, Dr. Welton Flakes from [facility]. He cannot give much more history than this.  PAST MEDICAL HISTORY:   Past Medical History  Diagnosis Date  . Schizophrenia   . Diabetes mellitus without complication   . Hypertension   . GERD (gastroesophageal reflux disease)   . CAD (coronary artery disease)     s/p coronary stent 2003    PAST SURGICAL HISTORY:   Past Surgical History  Procedure Laterality Date  . Colonoscopy      SOCIAL HISTORY:   History  Substance Use Topics  . Smoking status: Former Games developer  . Smokeless tobacco: Not on file  . Alcohol Use: No    FAMILY HISTORY:   Family History  Problem Relation Age of Onset  . Diabetes      DRUG ALLERGIES:  No Known Allergies  MEDICATIONS AT HOME:    Prior to Admission medications   Medication Sig Start Date End Date Taking? Authorizing Provider  amiodarone (PACERONE) 200 MG tablet Take 200 mg by mouth daily. 05/18/15  Yes Historical Provider, MD  aspirin EC 81 MG tablet Take 81 mg by mouth daily.   Yes Historical Provider, MD  atorvastatin (LIPITOR) 10 MG tablet Take 10 mg by mouth daily. 05/18/15  Yes Historical Provider, MD  benztropine (COGENTIN) 1 MG tablet Take 1 mg by mouth at bedtime. 05/18/15  Yes Historical Provider, MD  citalopram (CELEXA) 20 MG tablet Take 20 mg by mouth at bedtime. 05/18/15  Yes Historical Provider, MD  divalproex (DEPAKOTE ER) 500 MG 24 hr tablet Take 2,000 mg by mouth at bedtime. 05/18/15  Yes Historical Provider, MD  ELIQUIS 5 MG TABS tablet Take 5 mg by mouth 2 (two) times daily. 05/18/15  Yes Historical Provider, MD  hydrochlorothiazide (HYDRODIURIL) 25 MG tablet Take 25 mg by mouth daily. 05/18/15  Yes Historical Provider, MD  isosorbide mononitrate (IMDUR) 30 MG 24 hr tablet Take 30 mg by mouth daily. 04/27/15  Yes Historical Provider, MD  metFORMIN (GLUCOPHAGE) 500 MG tablet Take 1,000 mg by mouth 2 (two) times daily. 05/18/15  Yes Historical Provider, MD  metoprolol succinate (TOPROL-XL) 50 MG 24 hr tablet Take 50 mg by mouth daily. 05/18/15  Yes Historical Provider, MD  ramipril (ALTACE) 5 MG capsule Take 5 mg by mouth every morning. 05/18/15  Yes Historical Provider, MD  ranitidine (ZANTAC) 150 MG tablet Take 150 mg by mouth 2 (two) times daily. 04/22/15  Yes Historical Provider, MD  risperiDONE (RISPERDAL) 1 MG tablet Take 1 mg by mouth at bedtime. 05/18/15  Yes Historical Provider, MD  risperidone (RISPERDAL) 4 MG tablet Take 4 mg by mouth at bedtime. 05/18/15  Yes Historical Provider, MD    REVIEW OF SYSTEMS:  Review of Systems  Constitutional: Negative for fever, chills, weight loss and malaise/fatigue.  HENT: Negative for ear pain, hearing loss and tinnitus.   Eyes: Negative for blurred vision, double  vision, pain and redness.  Respiratory: Negative for cough, hemoptysis and shortness of breath.   Cardiovascular: Negative for chest pain, palpitations, orthopnea and leg swelling.       Sometimes "heart feels funny"  Gastrointestinal: Negative for nausea, vomiting, abdominal pain, diarrhea and constipation.  Genitourinary: Negative for dysuria, frequency and hematuria.  Musculoskeletal: Positive for falls. Negative for back pain, joint pain and neck pain.  Skin:       No acne, rash, or lesions  Neurological: Negative for dizziness, tremors, focal weakness and weakness.       Syncope or presyncope with fall  Endo/Heme/Allergies: Negative for polydipsia. Does not bruise/bleed easily.  Psychiatric/Behavioral: Negative for depression. The patient is not nervous/anxious and does not have insomnia.      VITAL SIGNS:   Filed Vitals:   05/23/15 1730 05/23/15 1813 05/23/15 1831 05/23/15 1908  BP: 99/58 101/66 94/60 98/69   Pulse:  83 95 97  Temp:      TempSrc:      Resp: 20 22 18 18   Height:      Weight:      SpO2:  95% 92% 98%   Wt Readings from Last 3 Encounters:  05/23/15 98.431 kg (217 lb)    PHYSICAL EXAMINATION:  Physical Exam  Constitutional: He is oriented to person, place, and time. He appears well-developed and well-nourished. No distress.  HENT:  Head: Normocephalic and atraumatic.  Mouth/Throat: Oropharynx is clear and moist.  Eyes: Conjunctivae and EOM are normal. Pupils are equal, round, and reactive to light. No scleral icterus.  Neck: Normal range of motion. Neck supple. No JVD present. No thyromegaly present.  Cardiovascular: Normal rate and intact distal pulses.  Exam reveals no gallop and no friction rub.   No murmur heard. Initially tachycardic, then rate controlled, irregular rhythm  Respiratory: Effort normal and breath sounds normal. No respiratory distress. He has no wheezes. He has no rales.  GI: Soft. Bowel sounds are normal. He exhibits no distension.  There is no tenderness.  Musculoskeletal: Normal range of motion. He exhibits no edema.  No arthritis, no gout  Lymphadenopathy:    He has no cervical adenopathy.  Neurological: He is alert and oriented to person, place, and time. No cranial nerve deficit.  No dysarthria, no aphasia  Skin: Skin is warm and dry. No rash noted. No erythema.  Psychiatric: He has a normal mood and affect. His behavior is normal. Judgment and thought content normal.    LABORATORY PANEL:   CBC  Recent Labs Lab 05/23/15 1621  WBC 5.0  HGB 10.8*  HCT 33.0*  PLT 129*   ------------------------------------------------------------------------------------------------------------------  Chemistries   Recent Labs Lab 05/23/15 1621  NA 140  K 4.0  CL 107  CO2 25  GLUCOSE 164*  BUN 25*  CREATININE 1.55*  CALCIUM 8.6*  AST 25  ALT 13*  ALKPHOS 45  BILITOT 0.5   ------------------------------------------------------------------------------------------------------------------  Cardiac Enzymes  Recent Labs Lab 05/23/15 1621  TROPONINI <0.03   ------------------------------------------------------------------------------------------------------------------  RADIOLOGY:  Dg Chest 1 View  05/23/2015   CLINICAL DATA:  Dizziness and syncope. History of hypertension and diabetes.  EXAM: CHEST  1 VIEW  COMPARISON:  None.  FINDINGS: The heart size and mediastinal contours are within normal limits. Both lungs are clear. The visualized skeletal structures are unremarkable.  IMPRESSION: No active cardiopulmonary disease.   Electronically Signed   By: Sherian ReinWei-Chen  Lin M.D.   On: 05/23/2015 17:58    EKG:   Orders placed or performed during the hospital encounter of 05/23/15  . ED EKG  . ED EKG    IMPRESSION AND PLAN:  Principal Problem:   Atrial fibrillation with RVR - initially RVR, the rate controlled with by mouth Cardizem in the ED. Read as A. fib on the EKG, though potentially more likely in a  flutter. Unclear if this is truly new diagnosis, patient follows with cardiologist, and reportedly by his pharmacy is also on Eliquis and amiodarone and Toprol-XL. We will continue these medications while he is here, cautiously due to a lowish blood pressure. Cardiology consult Active Problems:   Syncope - or presyncope, patient is not able to give a detailed history on this incident. He did have a fall reported by his sister. Unclear if this is related to his arrhythmia. He has been stating however that sometimes his "heart feels funny", and that he sometimes gets a little lightheaded.   Schizophrenia - continue home medications   Diabetes mellitus, type II - heart healthy/carb modified diet, sliding scale insulin, hold home dose metformin as his creatinine is greater than 1.5.   HTN (hypertension) - borderline low blood pressure, we will hold antihypertensives at this time, give fluid resuscitation.   CAD (coronary artery disease) - continue appropriate home medications   GERD (gastroesophageal reflux disease) - PPI while here.   All the records are reviewed and case discussed with ED provider. Management plans discussed with the patient and/or family.  DVT PROPHYLAXIS: systemic anticoagulation  ADMISSION STATUS: Observation  CODE STATUS: Full  TOTAL TIME TAKING CARE OF THIS PATIENT: 45 minutes.    WILLIS, DAVID FIELDING 05/23/2015, 8:11 PM  [facility]  Office  [phone]  CC: Primary care physician; Luna FuseEJAN-SIE, SHEIKH AHMED, MD

## 2015-05-24 LAB — GLUCOSE, CAPILLARY
Glucose-Capillary: 60 mg/dL — ABNORMAL LOW (ref 65–99)
Glucose-Capillary: 73 mg/dL (ref 65–99)
Glucose-Capillary: 128 mg/dL — ABNORMAL HIGH (ref 65–99)

## 2015-05-24 LAB — BASIC METABOLIC PANEL
Anion gap: 3 — ABNORMAL LOW (ref 5–15)
BUN: 19 mg/dL (ref 6–20)
CO2: 28 mmol/L (ref 22–32)
Calcium: 8.7 mg/dL — ABNORMAL LOW (ref 8.9–10.3)
Chloride: 110 mmol/L (ref 101–111)
Creatinine, Ser: 1.06 mg/dL (ref 0.61–1.24)
GFR calc Af Amer: >60 mL/min (ref >60)
GFR calc non Af Amer: >60 mL/min (ref >60)
Glucose, Bld: 78 mg/dL (ref 65–99)
Potassium: 4 mmol/L (ref 3.5–5.1)
Sodium: 141 mmol/L (ref 135–145)

## 2015-05-24 LAB — CBC
HCT: 35 % — ABNORMAL LOW (ref 40.0–52.0)
Hemoglobin: 11.6 g/dL — ABNORMAL LOW (ref 13.0–18.0)
MCH: 32.9 pg (ref 26.0–34.0)
MCHC: 33.1 g/dL (ref 32.0–36.0)
MCV: 99.6 fL (ref 80.0–100.0)
Platelets: 125 10*3/uL — ABNORMAL LOW (ref 150–440)
RBC: 3.52 MIL/uL — ABNORMAL LOW (ref 4.40–5.90)
RDW: 14.9 % — ABNORMAL HIGH (ref 11.5–14.5)
WBC: 6.8 10*3/uL (ref 3.8–10.6)

## 2015-05-24 NOTE — Consult Note (Signed)
[facility] Cardiology  CARDIOLOGY CONSULT NOTE  Patient ID: Brian Dodson MRN: [id number] DOB/AGE: 1956/07/13 59 y.o.  Admit date: 05/23/2015 Referring Physician Oralia Manis MD Primary Physician [facility] Primary Cardiologist Welton Flakes Reason for Consultation atrial fibrillation/atrial flutter  HPI: The patient is a 59 year old gentleman has history of atrial fibrillation, currently on [facility] request for stroke prevention and amiodarone for rhythm control. Patient has a history of schizophrenia and is not a very good historian. Currently he was in his usual state of health of admission when he was found by his sister laying on the floor jerking movements. She was reluctant to come to the hospital. He was brought to [facility] emergency room he was noted to be in atrial fibrillation with a rapid ventricular rate of 115 bpm. Patient was given a dose of Cardizem. The patient denies chest pain, shortness of breath, palpitations presyncope or syncope. He was admitted to telemetry where his remained in atrial fibrillation/atrial flutter with a controlled ventricular rate. Patient currently is asymptomatic.  Review of systems complete and found to be negative unless listed above     Past Medical History  Diagnosis Date  . Schizophrenia   . Diabetes mellitus without complication   . Hypertension   . GERD (gastroesophageal reflux disease)   . CAD (coronary artery disease)     s/p coronary stent 2003    Past Surgical History  Procedure Laterality Date  . Colonoscopy      Prescriptions prior to admission  Medication Sig Dispense Refill Last Dose  . amiodarone (PACERONE) 200 MG tablet Take 200 mg by mouth daily.   unknown  . aspirin EC 81 MG tablet Take 81 mg by mouth daily.   unknown  . atorvastatin (LIPITOR) 10 MG tablet Take 10 mg by mouth daily.   unknown  . benztropine (COGENTIN) 1 MG tablet Take 1 mg by mouth at bedtime.   unknown  . citalopram (CELEXA) 20 MG tablet Take 20 mg by mouth at bedtime.    unknown  . divalproex (DEPAKOTE ER) 500 MG 24 hr tablet Take 2,000 mg by mouth at bedtime.   unknown  . ELIQUIS 5 MG TABS tablet Take 5 mg by mouth 2 (two) times daily.   unknown  . hydrochlorothiazide (HYDRODIURIL) 25 MG tablet Take 25 mg by mouth daily.   unknown  . isosorbide mononitrate (IMDUR) 30 MG 24 hr tablet Take 30 mg by mouth daily.   unknown  . metFORMIN (GLUCOPHAGE) 500 MG tablet Take 1,000 mg by mouth 2 (two) times daily.   unknown  . metoprolol succinate (TOPROL-XL) 50 MG 24 hr tablet Take 50 mg by mouth daily.   unknown  . ramipril (ALTACE) 5 MG capsule Take 5 mg by mouth every morning.   unknown  . ranitidine (ZANTAC) 150 MG tablet Take 150 mg by mouth 2 (two) times daily.   unknown  . risperiDONE (RISPERDAL) 1 MG tablet Take 1 mg by mouth at bedtime.   unknown  . risperidone (RISPERDAL) 4 MG tablet Take 4 mg by mouth at bedtime.   unknown   History   Social History  . Marital Status: Single    Spouse Name: N/A  . Number of Children: N/A  . Years of Education: N/A   Occupational History  . Not on file.   Social History Main Topics  . Smoking status: Former Games developer  . Smokeless tobacco: Not on file  . Alcohol Use: No  . Drug Use: No  . Sexual Activity: Not on file  Other Topics Concern  . Not on file   Social History Narrative  . No narrative on file    Family History  Problem Relation Age of Onset  . Diabetes        Review of systems complete and found to be negative unless listed above      PHYSICAL EXAM  General: Well developed, well nourished, in no acute distress HEENT:  Normocephalic and atramatic Neck:  No JVD.  Lungs: Clear bilaterally to auscultation and percussion. Heart: HRRR . Normal S1 and S2 without gallops or murmurs.  Abdomen: Bowel sounds are positive, abdomen soft and non-tender  Msk:  Back normal, normal gait. Normal strength and tone for age. Extremities: No clubbing, cyanosis or edema.   Neuro: Alert and oriented X  3. Psych:  Good affect, responds appropriately  Labs:   Lab Results  Component Value Date   WBC 6.8 05/24/2015   HGB 11.6* 05/24/2015   HCT 35.0* 05/24/2015   MCV 99.6 05/24/2015   PLT 125* 05/24/2015    Recent Labs Lab 05/23/15 1621 05/24/15 0440  NA 140 141  K 4.0 4.0  CL 107 110  CO2 25 28  BUN 25* 19  CREATININE 1.55* 1.06  CALCIUM 8.6* 8.7*  PROT 6.1*  --   BILITOT 0.5  --   ALKPHOS 45  --   ALT 13*  --   AST 25  --   GLUCOSE 164* 78   Lab Results  Component Value Date   TROPONINI <0.03 05/23/2015   No results found for: CHOL No results found for: HDL No results found for: LDLCALC No results found for: TRIG No results found for: CHOLHDL No results found for: LDLDIRECT    Radiology: Dg Chest 1 View  05/23/2015   CLINICAL DATA:  Dizziness and syncope. History of hypertension and diabetes.  EXAM: CHEST  1 VIEW  COMPARISON:  None.  FINDINGS: The heart size and mediastinal contours are within normal limits. Both lungs are clear. The visualized skeletal structures are unremarkable.  IMPRESSION: No active cardiopulmonary disease.   Electronically Signed   By: Sherian ReinWei-Chen  Lin M.D.   On: 05/23/2015 17:58    EKG: Atrial flutter  ASSESSMENT AND PLAN:   59 year old gentleman with history of paroxysmal atrial fibrillation/atrial flutter, chads Vasc of 3, currently on Eliquis for stroke prevention and amiodarone for rhythm control. The patient has ruled out for myocardial infarction by negative troponin. Patient currently asymptomatic.  Recommendations  1. Agree with current therapy 2. Continue Eliquis for stroke prevention 3. Continue amiodarone for rhythm control 4. Patient remains clinically stable today, consider discharge and follow-up with Dr. Welton FlakesKhan  Signed: Marcina MillardPARASCHOS,ALEXANDER MD,PhD, [facility] 05/24/2015, 8:28 AM

## 2015-05-24 NOTE — Progress Notes (Signed)
Removed telemetry and removed iv.  No new rx.  VSS.  Family at bedside.  No questions at this time.

## 2015-05-24 NOTE — Progress Notes (Signed)
[facility]         [place]   05/24/2015  Patient: Brian Dodson   Date of Birth:  10/03/1956  Date of admission:  05/23/2015  Date of Discharge  05/24/2015    To Whom it May Concern:   Brian Dodson  may return to work on 05/25/2015 at the [employer] [facility].  PHYSICAL ACTIVITY:  Full. No restrictions. Please be sure he drinks plenty of water.  If you have any questions or concerns, please don't hesitate to call.  Sincerely,   Elby ShowersWALSH, CATHERINE M.D Pager [phone]- [phone] Office : [phone]   .
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
## 2015-05-24 NOTE — Progress Notes (Addendum)
[name] was discharged from [facility] on 05/24/15.  He has no activity restrictions at this time and [facility].     Amy Renaldo FiddlerDiana Dalton, RN 05/24/2015 2:03 PM

## 2015-05-24 NOTE — Discharge Instructions (Signed)
Please note that 2 blood pressure medications hydrochlorothiazide and ramipril have been held. You can discuss these with her primary care provider at your next appointment.   DIET:  Cardiac diet, carbohydrate modified  DISCHARGE CONDITION:  Stable  ACTIVITY:  Activity as tolerated  OXYGEN:  Home Oxygen: No.   Oxygen Delivery: room air  DISCHARGE LOCATION:  home   If you experience worsening of your admission symptoms, develop shortness of breath, life threatening emergency, suicidal or homicidal thoughts you must seek medical attention immediately by calling 911 or calling your MD immediately  if symptoms less severe.  You Must read complete instructions/literature along with all the possible adverse reactions/side effects for all the Medicines you take and that have been prescribed to you. Take any new Medicines after you have completely understood and accpet all the possible adverse reactions/side effects.   Please note  You were cared for by a hospitalist during your hospital stay. If you have any questions about your discharge medications or the care you received while you were in the hospital after you are discharged, you can call the unit and asked to speak with the hospitalist on call if the hospitalist that took care of you is not available. Once you are discharged, your primary care physician will handle any further medical issues. Please note that NO REFILLS for any discharge medications will be authorized once you are discharged, as it is imperative that you return to your primary care physician (or establish a relationship with a primary care physician if you do not have one) for your aftercare needs so that they can reassess your need for medications and monitor your lab values.    [facility]-  FIRST VISIT WILL BE MADE 05/25/15  [phone]

## 2015-05-24 NOTE — Progress Notes (Signed)
Pt has been in A-flutter with HR in between 47-99 and pauses less than 3 seconds since he got to the unit. MD, Hower Aware.

## 2015-05-24 NOTE — Discharge Summary (Signed)
[facility] [place] [facility]  DISCHARGE SUMMARY   PATIENT NAME: Brian Dodson    MR#:  [id number]  DATE OF BIRTH:  [birth year]  DATE OF ADMISSION:  05/23/2015 ADMITTING PHYSICIAN: Oralia Manisavid Willis, MD  DATE OF DISCHARGE: 05/24/2015  PRIMARY CARE PHYSICIAN: Luna FuseEJAN-SIE, SHEIKH AHMED, MD    ADMISSION DIAGNOSIS:  Syncope and collapse [R55] Weakness [R53.1] Atrial fibrillation, new onset [I48.91]  DISCHARGE DIAGNOSIS:  Principal Problem:   Atrial fibrillation with RVR Active Problems:   Syncope   Schizophrenia   Diabetes mellitus, type II   HTN (hypertension)   GERD (gastroesophageal reflux disease)   CAD (coronary artery disease)   SECONDARY DIAGNOSIS:   Past Medical History  Diagnosis Date  . Schizophrenia   . Diabetes mellitus without complication   . Hypertension   . GERD (gastroesophageal reflux disease)   . CAD (coronary artery disease)     s/p coronary stent 2003    HOSPITAL COURSE:   1) Atrial fibrillation with RVR - presented in rapid ventricular rate with rates in the 120s. He received 1 dose of oral Cardizem in the emergency room and rates were much better controlled after this. Also dehydrated on admission which likely contributed to elevated heart rate. He was seen by Dr. Windell NorfolkParraschos while inpatient. He will continue on his home regimen of Eliquis, amiodarone and Toprol. He will continue to follow with his primary cardiologist Dr. Park BreedKahn as an outpatient. Prior to discharge his heart rate is in the 70s.  #2 presyncope: Patient reports palpitations and lightheadedness. His sister describes an episode of weakness and falling to the ground after standing up quickly. This is most likely due to dehydration. On presentation he was hypotensive, his BUN and creatinine were elevated as well as his heart rate. His sister reports that his primary care physician has encouraged him to drink more fluids in the past. I reinforced this again today, should  try to increase by mouth fluid intake. I have also held his hydrochlorothiazide and ramipril until he sees his primary care provider.  #3 acute renal failure: Due to volume depletion. This has improved with IV fluids. Holding hydrochlorothiazide and ramipril. This can be followed by his primary care provider at the time of discharge his renal function is normal area  #4 Schizophrenia - stable. continue home medications   #5 Diabetes mellitus, type II - resume metformin on discharge.  #6 HTN (hypertension) - blood pressures normal at the time of discharge. Holding hydrochlorothiazide and ramipril. This can be reevaluated by his primary care provider. An appointment has been made for this week.  #7 CAD (coronary artery disease) - ruled out for ACS. Followed by cardiology during admission. Heart rate now controlled. No chest pain or shortness of breath   DISCHARGE CONDITIONS:   fair  CONSULTS OBTAINED:  Treatment Team:  Marcina MillardAlexander Paraschos, MD  DRUG ALLERGIES:  No Known Allergies  DISCHARGE MEDICATIONS:   Current Discharge Medication List    CONTINUE these medications which have NOT CHANGED   Details  amiodarone (PACERONE) 200 MG tablet Take 200 mg by mouth daily.    aspirin EC 81 MG tablet Take 81 mg by mouth daily.    atorvastatin (LIPITOR) 10 MG tablet Take 10 mg by mouth daily.    benztropine (COGENTIN) 1 MG tablet Take 1 mg by mouth at bedtime.    citalopram (CELEXA) 20 MG tablet Take 20 mg by mouth at bedtime.    divalproex (DEPAKOTE ER) 500 MG 24 hr tablet  Take 2,000 mg by mouth at bedtime.    ELIQUIS 5 MG TABS tablet Take 5 mg by mouth 2 (two) times daily.    isosorbide mononitrate (IMDUR) 30 MG 24 hr tablet Take 30 mg by mouth daily.    metFORMIN (GLUCOPHAGE) 500 MG tablet Take 1,000 mg by mouth 2 (two) times daily.    metoprolol succinate (TOPROL-XL) 50 MG 24 hr tablet Take 50 mg by mouth daily.    ranitidine (ZANTAC) 150 MG tablet Take 150 mg by mouth 2  (two) times daily.    !! risperiDONE (RISPERDAL) 1 MG tablet Take 1 mg by mouth at bedtime.    !! risperidone (RISPERDAL) 4 MG tablet Take 4 mg by mouth at bedtime.     !! - Potential duplicate medications found. Please discuss with provider.    STOP taking these medications     hydrochlorothiazide (HYDRODIURIL) 25 MG tablet      ramipril (ALTACE) 5 MG capsule          DISCHARGE INSTRUCTIONS:   Please note that 2 blood pressure medications hydrochlorothiazide and ramipril have been held. You can discuss these with her primary care provider at your next appointment.   DIET:  Cardiac diet, carbohydrate modified  DISCHARGE CONDITION:  Stable  ACTIVITY:  Activity as tolerated  OXYGEN:  Home Oxygen: No.   Oxygen Delivery: room air  DISCHARGE LOCATION:  home   If you experience worsening of your admission symptoms, develop shortness of breath, life threatening emergency, suicidal or homicidal thoughts you must seek medical attention immediately by calling 911 or calling your MD immediately  if symptoms less severe.  You Must read complete instructions/literature along with all the possible adverse reactions/side effects for all the Medicines you take and that have been prescribed to you. Take any new Medicines after you have completely understood and accpet all the possible adverse reactions/side effects.   Please note  You were cared for by a hospitalist during your hospital stay. If you have any questions about your discharge medications or the care you received while you were in the hospital after you are discharged, you can call the unit and asked to speak with the hospitalist on call if the hospitalist that took care of you is not available. Once you are discharged, your primary care physician will handle any further medical issues. Please note that NO REFILLS for any discharge medications will be authorized once you are discharged, as it is imperative that you return to  your primary care physician (or establish a relationship with a primary care physician if you do not have one) for your aftercare needs so that they can reassess your need for medications and monitor your lab values.   Today   CHIEF COMPLAINT:   Chief Complaint  Patient presents with  . Altered Mental Status    HISTORY OF PRESENT ILLNESS:  Selden Noteboom is a 59 y.o. male who presents with atrial fibrillation with RVR. History is difficult to obtain from patient, as he is unable to provide a number of details. Apparently though, he has been having some syncopal episodes, or some presyncopal episodes recently with fall reported by his sister. Patient also states that sometimes his heart feels funny. He denies any overt chest pain or shortness of breath. He denies any outright palpitations. In the ED he was found to be in atrial fibrillation with RVR. He was given a dose of by mouth Cardizem with good rate control. Hospitalists were called for evaluation for  his atrial fibrillation, which is unclear if this is a new diagnosis or not, and his potentially associated syncopal or presyncopal episodes. Patient states that he has a cardiologist, Dr. Welton Flakes from [place] [facility]. He cannot give much more history than this.  VITAL SIGNS:  Blood pressure 110/66, pulse 72, temperature 98.2 F (36.8 C), temperature source Oral, resp. rate 18, height  (1.854 m), weight 93.759 kg (206 lb 11.2 oz), SpO2 100 %.  I/O:    Intake/Output Summary (Last 24 hours) at 05/24/15 1156 Last data filed at 05/24/15 1011  Gross per 24 hour  Intake 721.67 ml  Output    950 ml  Net -228.33 ml    PHYSICAL EXAMINATION:  GENERAL:  59 y.o.-year-old patient lying in the bed with no acute distress.  EYES: Pupils equal, round, reactive to light and accommodation. No scleral icterus. Extraocular muscles intact.  HEENT: Head atraumatic, normocephalic. Oropharynx and nasopharynx clear.  NECK:  Supple, no jugular venous  distention. No thyroid enlargement, no tenderness.  LUNGS: Normal breath sounds bilaterally, no wheezing, rales, rhonchi or crepitation. No use of accessory muscles of respiration.  CARDIOVASCULAR: S1, S2 normal. No murmurs, rubs, or gallops. Irregular ABDOMEN: Soft, non-tender, non-distended. Bowel sounds present. No organomegaly or mass.  EXTREMITIES: No pedal edema, cyanosis, or clubbing.  NEUROLOGIC: Cranial nerves II through XII are intact. Muscle strength 5/5 in all extremities. Sensation intact. Gait not checked.  PSYCHIATRIC: The patient is alert and oriented x 3.  SKIN: No obvious rash, lesion, or ulcer.   DATA REVIEW:   CBC  Recent Labs Lab 05/24/15 0440  WBC 6.8  HGB 11.6*  HCT 35.0*  PLT 125*    Chemistries   Recent Labs Lab 05/23/15 1621 05/24/15 0440  NA 140 141  K 4.0 4.0  CL 107 110  CO2 25 28  GLUCOSE 164* 78  BUN 25* 19  CREATININE 1.55* 1.06  CALCIUM 8.6* 8.7*  AST 25  --   ALT 13*  --   ALKPHOS 45  --   BILITOT 0.5  --     Cardiac Enzymes  Recent Labs Lab 05/23/15 1621  TROPONINI <0.03    Microbiology Results  No results found for this or any previous visit.  RADIOLOGY:  Dg Chest 1 View  05/23/2015   CLINICAL DATA:  Dizziness and syncope. History of hypertension and diabetes.  EXAM: CHEST  1 VIEW  COMPARISON:  None.  FINDINGS: The heart size and mediastinal contours are within normal limits. Both lungs are clear. The visualized skeletal structures are unremarkable.  IMPRESSION: No active cardiopulmonary disease.   Electronically Signed   By: Sherian Rein M.D.   On: 05/23/2015 17:58    EKG:   Orders placed or performed during the hospital encounter of 05/23/15  . ED EKG  . ED EKG  . EKG 12-Lead  . EKG 12-Lead      Management plans discussed with the patient, family and they are in agreement.  CODE STATUS:     Code Status Orders        Start     Ordered   05/23/15 2127  Full code   Continuous     05/23/15 2126       TOTAL TIME TAKING CARE OF THIS PATIENT: 45 minutes.    Elby Showers M.D on 05/24/2015 at 11:56 AM  Between 7am to 6pm - Pager - [phone]  After 6pm go to [email or web address] - password Forensic psychologist Hospitalists  Office  [phone]  CC: Primary care physician; Luna Fuse, MD
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
## 2015-06-03 ENCOUNTER — Emergency Department
Admission: EM | Admit: 2015-06-03 | Discharge: 2015-06-03 | Disposition: A | Discharge status: Home / Self Care | Payer: Medicare Other | Attending: Student | Admitting: Student

## 2015-06-03 ENCOUNTER — Other Ambulatory Visit: Payer: Self-pay

## 2015-06-03 ENCOUNTER — Ambulatory Visit: Payer: Self-pay | Admitting: Urology

## 2015-06-03 ENCOUNTER — Encounter: Payer: Self-pay | Admitting: Urology

## 2015-06-03 DIAGNOSIS — Z87891 Personal history of nicotine dependence: Secondary | ICD-10-CM | POA: Diagnosis not present

## 2015-06-03 DIAGNOSIS — Z7982 Long term (current) use of aspirin: Secondary | ICD-10-CM | POA: Diagnosis not present

## 2015-06-03 DIAGNOSIS — Z79899 Other long term (current) drug therapy: Secondary | ICD-10-CM | POA: Insufficient documentation

## 2015-06-03 DIAGNOSIS — R454 Irritability and anger: Secondary | ICD-10-CM | POA: Diagnosis not present

## 2015-06-03 DIAGNOSIS — Z008 Encounter for other general examination: Secondary | ICD-10-CM | POA: Diagnosis present

## 2015-06-03 DIAGNOSIS — Z7902 Long term (current) use of antithrombotics/antiplatelets: Secondary | ICD-10-CM | POA: Insufficient documentation

## 2015-06-03 DIAGNOSIS — I10 Essential (primary) hypertension: Secondary | ICD-10-CM | POA: Diagnosis not present

## 2015-06-03 DIAGNOSIS — E119 Type 2 diabetes mellitus without complications: Secondary | ICD-10-CM | POA: Insufficient documentation

## 2015-06-03 HISTORY — DX: Constipation, unspecified: K59.00

## 2015-06-03 HISTORY — DX: Dizziness and giddiness: R42

## 2015-06-03 HISTORY — DX: Unspecified bulbous urethral stricture, male: N35.912

## 2015-06-03 HISTORY — DX: Hyperlipidemia, unspecified: E78.5

## 2015-06-03 HISTORY — DX: Dyspnea, unspecified: R06.00

## 2015-06-03 HISTORY — DX: Palpitations: R00.2

## 2015-06-03 HISTORY — DX: Unspecified asthma, uncomplicated: J45.909

## 2015-06-03 HISTORY — DX: Low back pain, unspecified: M54.50

## 2015-06-03 HISTORY — DX: Chronic obstructive pulmonary disease, unspecified: J44.9

## 2015-06-03 HISTORY — DX: Benign prostatic hyperplasia with lower urinary tract symptoms: N40.1

## 2015-06-03 HISTORY — DX: Gross hematuria: R31.0

## 2015-06-03 HISTORY — DX: Unspecified atrial fibrillation: I48.91

## 2015-06-03 HISTORY — DX: Low back pain: M54.5

## 2015-06-03 LAB — URINALYSIS COMPLETE WITH MICROSCOPIC (ARMC ONLY)
Bacteria, UA: NONE SEEN
Bilirubin Urine: NEGATIVE
Glucose, UA: NEGATIVE mg/dL
Hgb urine dipstick: NEGATIVE
Ketones, ur: NEGATIVE mg/dL
Leukocytes, UA: NEGATIVE
Nitrite: NEGATIVE
Protein, ur: NEGATIVE mg/dL
Specific Gravity, Urine: 1.03 (ref 1.005–1.030)
pH: 5 (ref 5.0–8.0)

## 2015-06-03 LAB — URINE DRUG SCREEN, QUALITATIVE (ARMC ONLY)
Amphetamines, Ur Screen: NOT DETECTED
Barbiturates, Ur Screen: NOT DETECTED
Benzodiazepine, Ur Scrn: NOT DETECTED
Cannabinoid 50 Ng, Ur ~~LOC~~: NOT DETECTED
Cocaine Metabolite,Ur ~~LOC~~: NOT DETECTED
MDMA (Ecstasy)Ur Screen: NOT DETECTED
Methadone Scn, Ur: NOT DETECTED
Opiate, Ur Screen: NOT DETECTED
Phencyclidine (PCP) Ur S: NOT DETECTED
Tricyclic, Ur Screen: NOT DETECTED

## 2015-06-03 LAB — CBC
HCT: 35.5 % — ABNORMAL LOW (ref 40.0–52.0)
Hemoglobin: 11.8 g/dL — ABNORMAL LOW (ref 13.0–18.0)
MCH: 32.6 pg (ref 26.0–34.0)
MCHC: 33.2 g/dL (ref 32.0–36.0)
MCV: 98.4 fL (ref 80.0–100.0)
Platelets: 142 10*3/uL — ABNORMAL LOW (ref 150–440)
RBC: 3.61 MIL/uL — ABNORMAL LOW (ref 4.40–5.90)
RDW: 14.6 % — ABNORMAL HIGH (ref 11.5–14.5)
WBC: 5.6 10*3/uL (ref 3.8–10.6)

## 2015-06-03 LAB — COMPREHENSIVE METABOLIC PANEL
ALT: 17 U/L (ref 17–63)
AST: 26 U/L (ref 15–41)
Albumin: 4.2 g/dL (ref 3.5–5.0)
Alkaline Phosphatase: 57 U/L (ref 38–126)
Anion gap: 5 (ref 5–15)
BUN: 32 mg/dL — ABNORMAL HIGH (ref 6–20)
CO2: 28 mmol/L (ref 22–32)
Calcium: 9.2 mg/dL (ref 8.9–10.3)
Chloride: 107 mmol/L (ref 101–111)
Creatinine, Ser: 1.21 mg/dL (ref 0.61–1.24)
GFR calc Af Amer: >60 mL/min (ref >60)
GFR calc non Af Amer: >60 mL/min (ref >60)
Glucose, Bld: 165 mg/dL — ABNORMAL HIGH (ref 65–99)
Potassium: 4.6 mmol/L (ref 3.5–5.1)
Sodium: 140 mmol/L (ref 135–145)
Total Bilirubin: 0.6 mg/dL (ref 0.3–1.2)
Total Protein: 7.3 g/dL (ref 6.5–8.1)

## 2015-06-03 LAB — ETHANOL: Alcohol, Ethyl (B): <5 mg/dL (ref <5)

## 2015-06-03 LAB — SALICYLATE LEVEL: Salicylate Lvl: <4 mg/dL (ref 2.8–30.0)

## 2015-06-03 LAB — ACETAMINOPHEN LEVEL: Acetaminophen (Tylenol), Serum: <10 ug/mL — ABNORMAL LOW (ref 10–30)

## 2015-06-03 NOTE — ED Notes (Signed)
In addition to his two MD appointments for today, pt expected to go to work after the appointments.  He works at the [employer]; [employer], cleaning.

## 2015-06-03 NOTE — ED Notes (Signed)
ED BHU PLACEMENT JUSTIFICATION Is the patient under IVC or is there intent for IVC: No. Is the patient medically cleared: No. Is there vacancy in the ED BHU: Yes.   Is the population mix appropriate for patient: Yes.   Is the patient awaiting placement in inpatient or outpatient setting: No. Has the patient had a psychiatric consult: No. Survey of unit performed for contraband, proper placement and condition of furniture, tampering with fixtures in bathroom, shower, and each patient room: Yes.  ; Findings:  APPEARANCE/BEHAVIOR calm, cooperative and adequate rapport can be established NEURO ASSESSMENT Orientation: time, place and person Hallucinations: No.None noted (Hallucinations) Speech: Normal Gait: normal RESPIRATORY ASSESSMENT Normal expansion.  Clear to auscultation.  No rales, rhonchi, or wheezing. CARDIOVASCULAR ASSESSMENT regular rate and rhythm, S1, S2 normal, no murmur, click, rub or gallop GASTROINTESTINAL ASSESSMENT soft, nontender, BS WNL, no r/g EXTREMITIES normal strength, tone, and muscle mass PLAN OF CARE Provide calm/safe environment. Vital signs assessed twice daily. ED BHU Assessment once each 12-hour shift. Collaborate with intake RN daily or as condition indicates. Assure the ED provider has rounded once each shift. Provide and encourage hygiene. Provide redirection as needed. Assess for escalating behavior; address immediately and inform ED provider.  Assess family dynamic and appropriateness for visitation as needed: Yes.  ; If necessary, describe findings:  Educate the patient/family about BHU procedures/visitation: Yes.  ; If necessary, describe findings:

## 2015-06-03 NOTE — ED Notes (Signed)
Patient assigned to appropriate care area. Patient oriented to unit/care area: Informed that, for their safety, care areas are designed for safety and monitored by security cameras at all times; and visiting hours explained to patient. Patient verbalizes understanding, and verbal contract for safety obtained. 

## 2015-06-03 NOTE — ED Notes (Signed)
Pt accepts visit from his sister.

## 2015-06-03 NOTE — ED Notes (Signed)
No IV and no one-on-one sitter required for this pt, per Dr. Inocencio HomesGayle.

## 2015-06-03 NOTE — ED Notes (Signed)
Pt A@O  x 3.  Positive attitude. No complaints.  No pain.  Will be leaving with sister.  Consulted with counseling/social work and MD.

## 2015-06-03 NOTE — ED Notes (Signed)
BEHAVIORAL HEALTH ROUNDING Patient sleeping: No. Patient alert and oriented: yes Behavior appropriate: Yes.  ; If no, describe:  Nutrition and fluids offered: Yes  Toileting and hygiene offered: Yes  Sitter present: not applicable Law enforcement present: Yes  

## 2015-06-03 NOTE — ED Notes (Signed)
Pt speaks normally, but has only one tooth.  Pt does not have dentures.  Pt says "I gum my food."  When asked if pt required a special diet, he said he didn't.  Will put in order for regular diet and, if pt is unable to eat the food, will modify diet.

## 2015-06-03 NOTE — ED Notes (Signed)
Lunch served

## 2015-06-03 NOTE — ED Notes (Signed)
Patient denies pain and is resting comfortably.  

## 2015-06-03 NOTE — ED Notes (Signed)
Pt sitting at end of his bed.  Feet on the floor.  Pt positively interactive.  Laughs appropriately.  Requests Pepsi.  Cola provided.

## 2015-06-03 NOTE — ED Notes (Signed)
Pt continues to sit at the end of his bed; feet on the floor; very interactive and upbeat.  Speaks with passers-by.

## 2015-06-03 NOTE — Discharge Instructions (Signed)
Anger Management Anger is a normal human emotion. However, anger can range from mild irritation to rage. When your anger becomes harmful to yourself or others, it is unhealthy anger.  CAUSES  There are many reasons for unhealthy anger. Many people learn how to express anger from observing how their family expressed anger. In troubled, chaotic, or abusive families, anger can be expressed as rage or even violence. Children can grow up never learning how healthy anger can be expressed. Factors that contribute to unhealthy anger include:   Drug or alcohol abuse.  Post-traumatic stress disorder.  Traumatic brain injury. COMPLICATIONS  People with unhealthy anger tend to overreact and retaliate against a real or imagined threat. The need to retaliate can turn into violence or verbal abuse against another person. Chronic anger can lead to health problems, such as hypertension, high blood pressure, and depression. TREATMENT  Exercising, relaxing, meditating, or writing out your feelings all can be beneficial in managing moderate anger. For unhealthy anger, the following methods may be used:  Cognitive-behavioral counseling (learning skills to change the thoughts that influence your mood).  Relaxation training.  Interpersonal counseling.  Assertive communication skills.  Medication. Document Released: 09/02/2007 Document Revised: 01/28/2012 Document Reviewed: 01/11/2011 ExitCare Patient Information 2015 [employer]. This information is not intended to replace advice given to you by your health care provider. Make sure you discuss any questions you have with your health care provider.  

## 2015-06-03 NOTE — ED Notes (Signed)
Per EMS, pt was in his yard screaming "about everything." Since pt arrived ED 20, he has been calm.  A/O x 3.  When asked to recall this mornings events, pt said he guessed he was "just mean."  He says he has two doctors' appointments today (one at [facility] and the other he can't remember the name of the place).  He was trying to discuss these appointments with his sister and she got mad and then he got mad.  Per pt, this isn't the first time it has happened.  Per EMS, pt stayed overnight here 05/23/2015 and was [facility] at [facility].  Per EMS VS, 110/80; pulse 110; RR 18.  BS 175.  Family told EMS that pt has not been diagnosed with diabetes so no BS taken or medications.
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
## 2015-06-03 NOTE — ED Provider Notes (Signed)
[facility] Emergency Department Provider Note  ____________________________________________  Time seen: Approximately 9:49 AM  I have reviewed the triage vital signs and the nursing notes.   HISTORY  Chief Complaint Psychiatric Evaluation    Brian Dodson is a 59 y.o. male with history of schizophrenia, asthma, coronary artery disease, hypertension who presents for evaluation of an angry outburst which occurred suddenly this morning just prior to arrival. Patient was preparing to go to a doctor's appointment. His sister told him to turn off the light and he "didn't like the way she said it" so he became upset, began to curse, began to yell and walked outside. At no time did he threaten to harm himself or anyone else. He currently has no suicidal or homicidal ideation, no audiovisual hallucinations. There was no physical altercation. Current severity is mild. No other modifying factors. He is otherwise been in his usual state of health including no cough, sneezing, runny nose, congestion, vomiting, diarrhea, fevers, chills, chest pain or difficulty breathing.   Past Medical History  Diagnosis Date  . Schizophrenia   . Diabetes mellitus without complication   . Hypertension   . GERD (gastroesophageal reflux disease)   . CAD (coronary artery disease)     s/p coronary stent 2003  . Dyspnea   . Palpitation   . Asthma   . Atrial fibrillation   . Constipation   . Dizziness   . COPD (chronic obstructive pulmonary disease)   . Lumbago   . Hyperlipemia   . Bulbous urethral stricture   . Gross hematuria   . Benign prostatic hypertrophy with lower urinary tract symptoms (LUTS)     Patient Active Problem List   Diagnosis Date Noted  . Atrial fibrillation with RVR 05/23/2015  . Syncope 05/23/2015  . Schizophrenia 05/23/2015  . Diabetes mellitus, type II 05/23/2015  . HTN (hypertension) 05/23/2015  . GERD (gastroesophageal reflux disease) 05/23/2015  . CAD  (coronary artery disease) 05/23/2015    Past Surgical History  Procedure Laterality Date  . Colonoscopy    . Coronary angioplasty with stent placement  11/2003    Current Outpatient Rx  Name  Route  Sig  Dispense  Refill  . amiodarone (PACERONE) 200 MG tablet   Oral   Take 200 mg by mouth daily.         Marland Kitchen aspirin EC 81 MG tablet   Oral   Take 81 mg by mouth daily.         Marland Kitchen atorvastatin (LIPITOR) 10 MG tablet   Oral   Take 10 mg by mouth daily.         . benztropine (COGENTIN) 1 MG tablet   Oral   Take 1 mg by mouth at bedtime.         . citalopram (CELEXA) 20 MG tablet   Oral   Take 20 mg by mouth at bedtime.         . divalproex (DEPAKOTE ER) 500 MG 24 hr tablet   Oral   Take 2,000 mg by mouth at bedtime.         Marland Kitchen ELIQUIS 5 MG TABS tablet   Oral   Take 5 mg by mouth 2 (two) times daily.           Dispense as written.   . hydrochlorothiazide (HYDRODIURIL) 25 MG tablet               . isosorbide mononitrate (IMDUR) 30 MG 24 hr tablet  Oral   Take 30 mg by mouth daily.         . metFORMIN (GLUCOPHAGE) 500 MG tablet   Oral   Take 1,000 mg by mouth 2 (two) times daily.         . metoprolol succinate (TOPROL-XL) 50 MG 24 hr tablet   Oral   Take 50 mg by mouth daily.         . ramipril (ALTACE) 5 MG capsule               . ranitidine (ZANTAC) 150 MG tablet   Oral   Take 150 mg by mouth 2 (two) times daily.         . risperiDONE (RISPERDAL) 1 MG tablet   Oral   Take 1 mg by mouth at bedtime.         . risperidone (RISPERDAL) 4 MG tablet   Oral   Take 4 mg by mouth at bedtime.           Allergies Review of patient's allergies indicates no known allergies.  Family History  Problem Relation Age of Onset  . Diabetes    . Bladder Cancer Neg Hx   . Kidney cancer Neg Hx   . Prostate cancer Neg Hx     Social History History  Substance Use Topics  . Smoking status: Former Games developermoker  . Smokeless tobacco: Not on file   . Alcohol Use: No    Review of Systems Constitutional: No fever/chills Eyes: No visual changes. ENT: No sore throat. Cardiovascular: Denies chest pain. Respiratory: Denies shortness of breath. Gastrointestinal: No abdominal pain.  No nausea, no vomiting.  No diarrhea.  No constipation. Genitourinary: Negative for dysuria. Musculoskeletal: Negative for back pain. Skin: Negative for rash. Neurological: Negative for headaches, focal weakness or numbness.  10-point ROS otherwise negative.  ____________________________________________   PHYSICAL EXAM:  VITAL SIGNS: ED Triage Vitals  Enc Vitals Group     BP 06/03/15 0937 118/96 mmHg     Pulse Rate 06/03/15 0937 72     Resp 06/03/15 0937 18     Temp 06/03/15 0937 98.6 F (37 C)     Temp Source 06/03/15 0937 Oral     SpO2 06/03/15 0937 91 %     Weight 06/03/15 0937 225 lb (102.059 kg)     Height 06/03/15 0937 6\' 1"  (1.854 m)     Head Cir --      Peak Flow --      Pain Score 06/03/15 0939 0     Pain Loc --      Pain Edu? --      Excl. in GC? --     Constitutional: Alert and oriented. Well appearing and in no acute distress. Eyes: Conjunctivae are normal. PERRL. EOMI. Head: Atraumatic. Nose: No congestion/rhinnorhea. Mouth/Throat: Mucous membranes are moist.  Oropharynx non-erythematous. Neck: No stridor.  Cardiovascular: Normal rate, regular rhythm. Grossly normal heart sounds.  Good peripheral circulation. Respiratory: Normal respiratory effort.  No retractions. Lungs CTAB. Gastrointestinal: Soft and nontender. No distention. No abdominal bruits. No CVA tenderness. Genitourinary:  Musculoskeletal: No lower extremity tenderness nor edema.  No joint effusions. Neurologic:  Normal speech and language. No gross focal neurologic deficits are appreciated. No gait instability. Skin:  Skin is warm, dry and intact. No rash noted. Psychiatric: Mood and affect are normal. Speech and behavior are  normal.  ____________________________________________   LABS (all labs ordered are listed, but only abnormal results are displayed)  Labs Reviewed  COMPREHENSIVE METABOLIC PANEL - Abnormal; Notable for the following:    Glucose, Bld 165 (*)    BUN 32 (*)    All other components within normal limits  ACETAMINOPHEN LEVEL - Abnormal; Notable for the following:    Acetaminophen (Tylenol), Serum <10 (*)    All other components within normal limits  CBC - Abnormal; Notable for the following:    RBC 3.61 (*)    Hemoglobin 11.8 (*)    HCT 35.5 (*)    RDW 14.6 (*)    Platelets 142 (*)    All other components within normal limits  URINALYSIS COMPLETEWITH MICROSCOPIC ([facility] ONLY) - Abnormal; Notable for the following:    Color, Urine YELLOW (*)    APPearance CLEAR (*)    Squamous Epithelial / LPF 0-5 (*)    All other components within normal limits  ETHANOL  SALICYLATE LEVEL  URINE DRUG SCREEN, QUALITATIVE ([facility] ONLY)   ____________________________________________  EKG  none ____________________________________________  RADIOLOGY  None ____________________________________________   PROCEDURES  Procedure(s) performed: None  Critical Care performed: No  ____________________________________________   INITIAL IMPRESSION / ASSESSMENT AND PLAN / ED COURSE  Pertinent labs & imaging results that were available during my care of the patient were reviewed by me and considered in my medical decision making (see chart for details).  Brian Dodson is a 59 y.o. male with history of schizophrenia, asthma, coronary artery disease, hypertension who presents for evaluation of an angry outburst which occurred suddenly this morning just prior to arrival. On exam, he is very well-appearing and in no acute distress. He is alert and oriented 3. He denies any SI, HI, auto visual hallucinations. He has good insight. His exam is benign he has no acute medical complaints. He reports he was just  upset about the way his sister spoke to him this morning and sister confirms he did not threaten to harm her or harming anyone else. Both his story and his sister's story are the same. We'll observe in the Emergency Department.  Marland Kitchen----------------------------------------- 2:29 PM on 06/03/2015 -----------------------------------------  The patient has been observed in the emergency department for nearly 5 hours. He is lucid and pleasant. He is not responding to any internal stimuli. He continues to have no SI, HI, AVH. Vitals are stable. Labs reviewed and are generally unremarkable with the exception of mild anemia which is stable on chart review. I discussed the case again with his sister at this time (he shares a residence with her). We discussed that this sounds like they had a verbal disagreement and the patient is currently very calm, there is no indication to hold him in the emergency department. She reports that she is coming to pick him up; She does not feel he is a threat to her or himself.  ----------------------------------------- 2:49 PM on 06/03/2015 ----------------------------------------- The patient's sister is here to pick him up. We'll discharge with return precautions and PCP follow-up as needed.  ____________________________________________   FINAL CLINICAL IMPRESSION(S) / ED DIAGNOSES  Final diagnoses:  Outbursts of anger      Gayla Doss, MD 06/03/15 [phone]

## 2015-06-20 ENCOUNTER — Telehealth: Payer: Self-pay

## 2015-06-20 NOTE — Telephone Encounter (Signed)
Pt pharmacy sent a refill request for oxybutynin. Pt had DVIU, cysto, bilateral retrograde in April. Foley was removed 1 week later. Pt did not f/u in 40mo and no f/u is on file. Please advise.

## 2015-06-20 NOTE — Telephone Encounter (Signed)
No refill.  Medication was for bladder spasm related to the Foley.    Vanna Scotland, MD

## 2015-06-21 NOTE — Telephone Encounter (Signed)
Spoke with a family member of pt. Pt was not available. Try again tomorrow.

## 2015-06-22 NOTE — Telephone Encounter (Signed)
No answer

## 2015-06-23 NOTE — Telephone Encounter (Signed)
Spoke with pt sister directly with pt at her side. Pt stated denied need for medication.

## 2015-11-11 ENCOUNTER — Emergency Department
Admission: EM | Admit: 2015-11-11 | Discharge: 2015-11-12 | Disposition: A | Discharge status: Other Acute Inpatient Hospital | Payer: Medicare Other | Attending: Emergency Medicine | Admitting: Emergency Medicine

## 2015-11-11 ENCOUNTER — Encounter: Payer: Self-pay | Admitting: Emergency Medicine

## 2015-11-11 DIAGNOSIS — Z87891 Personal history of nicotine dependence: Secondary | ICD-10-CM | POA: Insufficient documentation

## 2015-11-11 DIAGNOSIS — Z7982 Long term (current) use of aspirin: Secondary | ICD-10-CM | POA: Insufficient documentation

## 2015-11-11 DIAGNOSIS — R5383 Other fatigue: Secondary | ICD-10-CM | POA: Diagnosis not present

## 2015-11-11 DIAGNOSIS — Z79899 Other long term (current) drug therapy: Secondary | ICD-10-CM | POA: Insufficient documentation

## 2015-11-11 DIAGNOSIS — I10 Essential (primary) hypertension: Secondary | ICD-10-CM | POA: Insufficient documentation

## 2015-11-11 DIAGNOSIS — R451 Restlessness and agitation: Secondary | ICD-10-CM

## 2015-11-11 DIAGNOSIS — F911 Conduct disorder, childhood-onset type: Secondary | ICD-10-CM | POA: Diagnosis present

## 2015-11-11 DIAGNOSIS — Z7984 Long term (current) use of oral hypoglycemic drugs: Secondary | ICD-10-CM | POA: Insufficient documentation

## 2015-11-11 DIAGNOSIS — E119 Type 2 diabetes mellitus without complications: Secondary | ICD-10-CM | POA: Insufficient documentation

## 2015-11-11 LAB — URINE DRUG SCREEN, QUALITATIVE (ARMC ONLY)
Amphetamines, Ur Screen: NOT DETECTED
Barbiturates, Ur Screen: NOT DETECTED
Benzodiazepine, Ur Scrn: NOT DETECTED
Cannabinoid 50 Ng, Ur ~~LOC~~: NOT DETECTED
Cocaine Metabolite,Ur ~~LOC~~: NOT DETECTED
MDMA (Ecstasy)Ur Screen: NOT DETECTED
Methadone Scn, Ur: NOT DETECTED
Opiate, Ur Screen: NOT DETECTED
Phencyclidine (PCP) Ur S: NOT DETECTED
Tricyclic, Ur Screen: NOT DETECTED

## 2015-11-11 LAB — COMPREHENSIVE METABOLIC PANEL
ALT: 17 U/L (ref 17–63)
AST: 28 U/L (ref 15–41)
Albumin: 3.7 g/dL (ref 3.5–5.0)
Alkaline Phosphatase: 50 U/L (ref 38–126)
Anion gap: 9 (ref 5–15)
BUN: 29 mg/dL — ABNORMAL HIGH (ref 6–20)
CO2: 27 mmol/L (ref 22–32)
Calcium: 8.9 mg/dL (ref 8.9–10.3)
Chloride: 103 mmol/L (ref 101–111)
Creatinine, Ser: 1.35 mg/dL — ABNORMAL HIGH (ref 0.61–1.24)
GFR calc Af Amer: >60 mL/min (ref >60)
GFR calc non Af Amer: 56 mL/min — ABNORMAL LOW (ref >60)
Glucose, Bld: 112 mg/dL — ABNORMAL HIGH (ref 65–99)
Potassium: 4 mmol/L (ref 3.5–5.1)
Sodium: 139 mmol/L (ref 135–145)
Total Bilirubin: 0.6 mg/dL (ref 0.3–1.2)
Total Protein: 6.8 g/dL (ref 6.5–8.1)

## 2015-11-11 LAB — CBC
HCT: 30.3 % — ABNORMAL LOW (ref 40.0–52.0)
Hemoglobin: 10.2 g/dL — ABNORMAL LOW (ref 13.0–18.0)
MCH: 33.1 pg (ref 26.0–34.0)
MCHC: 33.6 g/dL (ref 32.0–36.0)
MCV: 98.6 fL (ref 80.0–100.0)
Platelets: 139 10*3/uL — ABNORMAL LOW (ref 150–440)
RBC: 3.08 MIL/uL — ABNORMAL LOW (ref 4.40–5.90)
RDW: 14.2 % (ref 11.5–14.5)
WBC: 6.8 10*3/uL (ref 3.8–10.6)
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
## 2015-11-12 LAB — ETHANOL: Alcohol, Ethyl (B): <5 mg/dL (ref <5)

## 2015-11-12 LAB — ACETAMINOPHEN LEVEL: Acetaminophen (Tylenol), Serum: <10 ug/mL — ABNORMAL LOW (ref 10–30)

## 2015-11-12 LAB — SALICYLATE LEVEL: Salicylate Lvl: <4 mg/dL (ref 2.8–30.0)

## 2015-11-12 NOTE — ED Notes (Signed)
Pt is noted to be standing in his room in a military-like manner. Pt states that he is doing it to calm his nerves. Pt remains calm and cooperative at this time, pleasant with staff. NAD noted. Respirations even and unlabored. Pt appears to be staring at the TV at this time.

## 2015-11-12 NOTE — ED Notes (Signed)
Family contacted: [phone], Janice CoffinPatsy Bow, ID'd self as pt's guardian, states "I feel safe at home with him [patient]."

## 2015-11-12 NOTE — ED Notes (Signed)
PT visualized in NAD. Pt resting in bed at this time with blanket pulled over his head. Respirations noted to be even and unlabored at this time. Denies needs at this time.

## 2015-11-12 NOTE — ED Notes (Signed)
Pt noted to be sitting in chair beside the bed. NAD Noted at this time. Respirations even and unlabored at this time.

## 2015-11-12 NOTE — ED Provider Notes (Signed)
-----------------------------------------   1:40 PM on 11/12/2015 -----------------------------------------  I've spoken with TTS who has evaluated the patient. Then said the plan was that the patient would be discharged home without psychiatric consultation. He has remained calm. The family has agreed to pick him up. I think this is reasonable.  We will discharge him to continue his usual medications and follow up as an outpatient.  Darien Ramusavid W Kaminski, MD 11/12/15 [phone]

## 2015-11-12 NOTE — ED Notes (Signed)
Pt up to the bathroom at this time. NAD noted. Denies needs at this time. Spoke with patient's ACT team member Pam at [phone] who agreed to pick him up upon discharge.

## 2015-11-12 NOTE — ED Notes (Signed)

## 2015-11-12 NOTE — ED Notes (Signed)
Pt visualized in NAD. Pt requests to get a shower. Denies other needs at this time. Pt resting in bed watching TV. Respirations noted to be even and unlabored at this time.

## 2015-11-12 NOTE — ED Notes (Signed)
NAD noted at this time. Pt is in shower performing ADL's independently. Pt is calm and cooperative at this time. Will continue with 15 minute safety checks at this time.

## 2015-11-12 NOTE — Discharge Instructions (Signed)
Continue your current medications. Follow-up with her primary physician as well as any counselor that he may have. If you do not have a counselor or psychiatrist, [facility] can [facility]. Return to the emergency department if there are any other urgent concerns.

## 2015-11-12 NOTE — ED Notes (Signed)
NAD noted at time of D/C. Pt denies questions or concerns. Pt ambulatory to the lobby at this time.  
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
## 2015-11-12 NOTE — ED Provider Notes (Signed)
-----------------------------------------   7:58 AM on 11/12/2015 -----------------------------------------  BP 133/62 mmHg  Pulse 55  Temp(Src) 98.8 F (37.1 C)  Resp 20  Ht 6\' 1"  (1.854 m)  Wt 225 lb (102.059 kg)  BMI 29.69 kg/m2  SpO2 99%  Report from nurses tell me that the patient has been calm and cooperative through the night. They have had contact with the family who reports they're comfortable with the patient returning home. We are awaiting further evaluation by psychiatry.      Darien Ramusavid W Kaminski, MD 11/12/15 [phone]

## 2015-11-12 NOTE — ED Notes (Signed)
Patient resting quietly in room. No noted distress or abnormal behaviors noted. Will continue 15 minute checks and observation by security camera for safety. 

## 2016-03-14 ENCOUNTER — Ambulatory Visit (INDEPENDENT_AMBULATORY_CARE_PROVIDER_SITE_OTHER): Payer: Medicare Other | Admitting: Urology

## 2016-03-14 VITALS — BP 127/68 | HR 67 | Ht 73.0 in | Wt 217.7 lb

## 2016-03-14 DIAGNOSIS — N39498 Other specified urinary incontinence: Secondary | ICD-10-CM

## 2016-03-14 DIAGNOSIS — N358 Other urethral stricture: Secondary | ICD-10-CM | POA: Diagnosis not present

## 2016-03-14 LAB — URINALYSIS, COMPLETE
Bilirubin, UA: NEGATIVE
Glucose, UA: NEGATIVE
Ketones, UA: NEGATIVE
Nitrite, UA: NEGATIVE
Protein, UA: NEGATIVE
RBC, UA: NEGATIVE
Specific Gravity, UA: 1.025 (ref 1.005–1.030)
Urobilinogen, Ur: 1 mg/dL (ref 0.2–1.0)
pH, UA: 6 (ref 5.0–7.5)

## 2016-03-14 LAB — MICROSCOPIC EXAMINATION

## 2016-03-14 LAB — BLADDER SCAN AMB NON-IMAGING: Scan Result: 0

## 2016-03-14 NOTE — Progress Notes (Signed)
03/14/2016 9:43 AM   Brian Dodson 01/29/1956 [id number]  Referring provider: Sherron Monday, MD [address] [place], [place] [address]  Chief Complaint  Patient presents with  . Follow-up    urinary incontinence , Bulbous urethral stricture, surgery done x 1 yr ago     HPI: The patient is a 60-year-old gentleman presents for evaluation of urinary frequency and incontinence.  He has a history of a DVIU performed in April 2016 for a bulbar urethral stricture. It was approximately 1 cm in length.The patient returns today with a chief complaint of incontinence. He feels that he leaks urine especially after going to the bathroom. His PVR is 0 today. He also has some urgency. He feels that his symptoms are similar to what they were prior to his DVIU. He denies dysuria or urinary tract infections. This out approximately a few weeks. He denies any other changes to his urinary status.   PMH: Past Medical History  Diagnosis Date  . Schizophrenia (HCC)   . Diabetes mellitus without complication (HCC)   . Hypertension   . GERD (gastroesophageal reflux disease)   . CAD (coronary artery disease)     s/p coronary stent 2003  . Dyspnea   . Palpitation   . Asthma   . Atrial fibrillation (HCC)   . Constipation   . Dizziness   . COPD (chronic obstructive pulmonary disease) (HCC)   . Lumbago   . Hyperlipemia   . Bulbous urethral stricture   . Gross hematuria   . Benign prostatic hypertrophy with lower urinary tract symptoms (LUTS)     Surgical History: Past Surgical History  Procedure Laterality Date  . Colonoscopy    . Coronary angioplasty with stent placement  11/2003  . Kidney stent      Home Medications:    Medication List       This list is accurate as of: 03/14/16  9:43 AM.  Always use your most recent med list.               amiodarone 200 MG tablet  Commonly known as:  PACERONE  Take 200 mg by mouth daily.     aspirin EC 81 MG tablet  Take 81 mg by mouth  daily.     atorvastatin 10 MG tablet  Commonly known as:  LIPITOR  Take 10 mg by mouth daily.     benztropine 1 MG tablet  Commonly known as:  COGENTIN  Take 1 mg by mouth at bedtime.     citalopram 20 MG tablet  Commonly known as:  CELEXA  Take 20 mg by mouth at bedtime.     divalproex 500 MG 24 hr tablet  Commonly known as:  DEPAKOTE ER  Take 2,000 mg by mouth at bedtime.     ELIQUIS 5 MG Tabs tablet  Generic drug:  apixaban  Take 5 mg by mouth 2 (two) times daily.     hydrochlorothiazide 25 MG tablet  Commonly known as:  HYDRODIURIL     isosorbide mononitrate 30 MG 24 hr tablet  Commonly known as:  IMDUR  Take 30 mg by mouth daily.     metFORMIN 500 MG tablet  Commonly known as:  GLUCOPHAGE  Take 1,000 mg by mouth 2 (two) times daily.     metoprolol succinate 50 MG 24 hr tablet  Commonly known as:  TOPROL-XL  Take 50 mg by mouth daily.     metoprolol succinate 25 MG 24 hr tablet  Commonly  known as:  TOPROL-XL  Reported on 03/14/2016     ramipril 5 MG capsule  Commonly known as:  ALTACE     ranitidine 150 MG tablet  Commonly known as:  ZANTAC  Take 150 mg by mouth 2 (two) times daily.     risperiDONE 1 MG tablet  Commonly known as:  RISPERDAL  Take 1 mg by mouth at bedtime. Reported on 03/14/2016     risperidone 4 MG tablet  Commonly known as:  RISPERDAL  Take 4 mg by mouth at bedtime.        Allergies: No Known Allergies  Family History: Family History  Problem Relation Age of Onset  . Diabetes    . Bladder Cancer Neg Hx   . Kidney cancer Neg Hx   . Prostate cancer Neg Hx     Social History:  reports that he has quit smoking. He does not have any smokeless tobacco history on file. He reports that he does not drink alcohol or use illicit drugs.  ROS: UROLOGY Frequent Urination?: Yes Hard to postpone urination?: No Burning/pain with urination?: No Get up at night to urinate?: No Leakage of urine?: Yes Urine stream starts and stops?:  No Trouble starting stream?: Yes Do you have to strain to urinate?: Yes Blood in urine?: No Urinary tract infection?: No Sexually transmitted disease?: No Injury to kidneys or bladder?: No Painful intercourse?: No Weak stream?: Yes Erection problems?: No Penile pain?: No  Gastrointestinal Nausea?: No Vomiting?: No Indigestion/heartburn?: No Diarrhea?: No Constipation?: No  Constitutional Fever: No Night sweats?: No Weight loss?: Yes Fatigue?: No  Skin Skin rash/lesions?: Yes Itching?: Yes  Eyes Blurred vision?: Yes Double vision?: Yes  Ears/Nose/Throat Sore throat?: Yes Sinus problems?: Yes  Hematologic/Lymphatic Swollen glands?: No Easy bruising?: No  Cardiovascular Leg swelling?: Yes Chest pain?: No  Respiratory Cough?: Yes Shortness of breath?: Yes  Endocrine Excessive thirst?: No  Musculoskeletal Back pain?: Yes Joint pain?: Yes  Neurological Headaches?: Yes Dizziness?: Yes  Psychologic Depression?: Yes Anxiety?: Yes  Physical Exam: BP 127/68 mmHg  Pulse 67  Ht 6\' 1"  (1.854 m)  Wt 217 lb 11.2 oz (98.748 kg)  BMI 28.73 kg/m2  Constitutional:  Alert and oriented, No acute distress. HEENT: [place] AT, moist mucus membranes.  Trachea midline, no masses. Cardiovascular: No clubbing, cyanosis, or edema. Respiratory: Normal respiratory effort, no increased work of breathing. GI: Abdomen is soft, nontender, nondistended, no abdominal masses GU: No CVA tenderness.  Skin: No rashes, bruises or suspicious lesions. Lymph: No cervical or inguinal adenopathy. Neurologic: Grossly intact, no focal deficits, moving all 4 extremities. Psychiatric: Normal mood and affect.  Laboratory Data: Lab Results  Component Value Date   WBC 6.8 11/11/2015   HGB 10.2* 11/11/2015   HCT 30.3* 11/11/2015   MCV 98.6 11/11/2015   PLT 139* 11/11/2015    Lab Results  Component Value Date   CREATININE 1.35* 11/11/2015    No results found for: PSA  No results  found for: TESTOSTERONE  No results found for: HGBA1C  Urinalysis    Component Value Date/Time   COLORURINE YELLOW* 06/03/2015 1217   COLORURINE Yellow 01/29/2014 1545   APPEARANCEUR CLEAR* 06/03/2015 1217   APPEARANCEUR Clear 01/29/2014 1545   LABSPEC 1.030 06/03/2015 1217   LABSPEC 1.017 01/29/2014 1545   PHURINE 5.0 06/03/2015 1217   PHURINE 7.0 01/29/2014 1545   GLUCOSEU NEGATIVE 06/03/2015 1217   GLUCOSEU Negative 01/29/2014 1545   HGBUR NEGATIVE 06/03/2015 1217   HGBUR Negative 01/29/2014 1545  BILIRUBINUR NEGATIVE 06/03/2015 1217   BILIRUBINUR Negative 01/29/2014 1545   KETONESUR NEGATIVE 06/03/2015 1217   KETONESUR Negative 01/29/2014 1545   PROTEINUR NEGATIVE 06/03/2015 1217   PROTEINUR Negative 01/29/2014 1545   NITRITE NEGATIVE 06/03/2015 1217   NITRITE Negative 01/29/2014 1545   LEUKOCYTESUR NEGATIVE 06/03/2015 1217   LEUKOCYTESUR Negative 01/29/2014 1545    Assessment & Plan:    1. History of urethral stricture 2. Urinary incontinence I'm concerned that the patient's urethral strictures recur due to his new onset incontinence and symptoms similar to his symptoms prior to DVIU. The patient will leave a urine sample today for urinalysis to rule out infection. We'll bring him back in approximately 1 week for cystoscopy as I'm concerned he has recurrence of his urethral stricture.  Return in about 1 week (around 03/21/2016) for cystoscopy.  Hildred Laser, MD  [facility] [address], [address] [place], [place] [address] [phone]

## 2016-04-10 ENCOUNTER — Ambulatory Visit: Payer: Medicare Other | Admitting: Urology

## 2016-04-10 VITALS — BP 113/66 | HR 67 | Ht 74.0 in | Wt 225.0 lb

## 2016-04-10 DIAGNOSIS — N359 Urethral stricture, unspecified: Secondary | ICD-10-CM

## 2016-04-10 MED ORDER — LIDOCAINE HCL 2 % EX GEL
1.0000 "application " | Freq: Once | CUTANEOUS | Status: AC
  Administered 2016-04-10: 1 via URETHRAL

## 2016-04-10 MED ORDER — CIPROFLOXACIN HCL 500 MG PO TABS
500.0000 mg | ORAL_TABLET | Freq: Once | ORAL | Status: AC
  Administered 2016-04-10: 500 mg via ORAL

## 2016-04-10 NOTE — Procedures (Signed)
    Cystoscopy Procedure Note  The patient presented today for cystoscopy because of progressive voiding symptoms and intermittent urinary incontinence. He has a history of DVIU. Patient identification was confirmed, informed consent was obtained, and patient was prepped using Betadine solution.  Lidocaine jelly was administered per urethral meatus.    Preoperative abx where received prior to procedure.     Pre-Procedure: - Inspection reveals a normal caliber ureteral meatus.  Procedure: The flexible cystoscope was introduced without difficulty - There was a proximally 1 cm soft lobar urethral stricture which I was able to advance the scope through with very mild pressure. - Normal prostate   - Normal bladder neck - Bilateral ureteral orifices identified - Bladder mucosa  reveals no ulcers, tumors, or lesions - No bladder stones - No trabeculation  Retroflexion shows  normal bladder neck   Post-Procedure: - Patient tolerated the procedure well   discussion: The patient had a soft recurrent bulbar urethral stricture which I was able to dilate softly with the cystoscope. I suspect the stricture was the true cause of the patient's symptoms. Hopefully having dilated it gently with the scope he will get some resolution of his symptoms. We'll plan the patient return in 3 months for reevaluation.

## 2016-07-10 ENCOUNTER — Ambulatory Visit: Payer: Medicare Other | Admitting: Urology

## 2016-07-10 ENCOUNTER — Encounter: Payer: Self-pay | Admitting: Urology

## 2016-12-24 ENCOUNTER — Other Ambulatory Visit: Payer: Self-pay | Admitting: Internal Medicine

## 2016-12-24 ENCOUNTER — Other Ambulatory Visit: Payer: Self-pay | Admitting: Nephrology

## 2016-12-24 DIAGNOSIS — N183 Chronic kidney disease, stage 3 unspecified: Secondary | ICD-10-CM

## 2017-01-30 ENCOUNTER — Ambulatory Visit
Admission: RE | Admit: 2017-01-30 | Discharge: 2017-01-30 | Disposition: A | Discharge status: Home / Self Care | Payer: Medicare Other | Source: Ambulatory Visit | Attending: Nephrology | Admitting: Nephrology

## 2017-01-30 DIAGNOSIS — N183 Chronic kidney disease, stage 3 unspecified: Secondary | ICD-10-CM

## 2017-05-07 DIAGNOSIS — E538 Deficiency of other specified B group vitamins: Secondary | ICD-10-CM | POA: Insufficient documentation

## 2017-05-07 DIAGNOSIS — D649 Anemia, unspecified: Secondary | ICD-10-CM | POA: Insufficient documentation

## 2017-05-07 DIAGNOSIS — R413 Other amnesia: Secondary | ICD-10-CM | POA: Insufficient documentation

## 2017-05-20 ENCOUNTER — Inpatient Hospital Stay
Admission: EM | Admit: 2017-05-20 | Discharge: 2017-05-23 | DRG: 871 | Disposition: A | Discharge status: Nursing Home (Includes ICF) | Payer: Medicare Other | Attending: Specialist | Admitting: Specialist

## 2017-05-20 ENCOUNTER — Emergency Department: Payer: Medicare Other

## 2017-05-20 ENCOUNTER — Encounter: Payer: Self-pay | Admitting: Emergency Medicine

## 2017-05-20 DIAGNOSIS — Y95 Nosocomial condition: Secondary | ICD-10-CM | POA: Diagnosis present

## 2017-05-20 DIAGNOSIS — I2510 Atherosclerotic heart disease of native coronary artery without angina pectoris: Secondary | ICD-10-CM | POA: Diagnosis present

## 2017-05-20 DIAGNOSIS — A419 Sepsis, unspecified organism: Principal | ICD-10-CM | POA: Diagnosis present

## 2017-05-20 DIAGNOSIS — F209 Schizophrenia, unspecified: Secondary | ICD-10-CM | POA: Diagnosis present

## 2017-05-20 DIAGNOSIS — I4891 Unspecified atrial fibrillation: Secondary | ICD-10-CM

## 2017-05-20 DIAGNOSIS — Z79899 Other long term (current) drug therapy: Secondary | ICD-10-CM | POA: Diagnosis not present

## 2017-05-20 DIAGNOSIS — Z7982 Long term (current) use of aspirin: Secondary | ICD-10-CM

## 2017-05-20 DIAGNOSIS — E785 Hyperlipidemia, unspecified: Secondary | ICD-10-CM | POA: Diagnosis present

## 2017-05-20 DIAGNOSIS — Z7984 Long term (current) use of oral hypoglycemic drugs: Secondary | ICD-10-CM

## 2017-05-20 DIAGNOSIS — E872 Acidosis: Secondary | ICD-10-CM | POA: Diagnosis present

## 2017-05-20 DIAGNOSIS — J440 Chronic obstructive pulmonary disease with acute lower respiratory infection: Secondary | ICD-10-CM | POA: Diagnosis present

## 2017-05-20 DIAGNOSIS — D696 Thrombocytopenia, unspecified: Secondary | ICD-10-CM | POA: Diagnosis present

## 2017-05-20 DIAGNOSIS — Z87891 Personal history of nicotine dependence: Secondary | ICD-10-CM

## 2017-05-20 DIAGNOSIS — J189 Pneumonia, unspecified organism: Secondary | ICD-10-CM | POA: Diagnosis present

## 2017-05-20 DIAGNOSIS — Z955 Presence of coronary angioplasty implant and graft: Secondary | ICD-10-CM

## 2017-05-20 DIAGNOSIS — E119 Type 2 diabetes mellitus without complications: Secondary | ICD-10-CM | POA: Diagnosis present

## 2017-05-20 DIAGNOSIS — K219 Gastro-esophageal reflux disease without esophagitis: Secondary | ICD-10-CM | POA: Diagnosis present

## 2017-05-20 LAB — URINALYSIS, COMPLETE (UACMP) WITH MICROSCOPIC
Bacteria, UA: NONE SEEN
Bilirubin Urine: NEGATIVE
Glucose, UA: NEGATIVE mg/dL
Hgb urine dipstick: NEGATIVE
Ketones, ur: NEGATIVE mg/dL
Leukocytes, UA: NEGATIVE
Nitrite: NEGATIVE
Protein, ur: 30 mg/dL — AB
RBC / HPF: NONE SEEN RBC/hpf (ref 0–5)
Specific Gravity, Urine: 1.03 (ref 1.005–1.030)
pH: 5 (ref 5.0–8.0)

## 2017-05-20 LAB — LACTIC ACID, PLASMA
Lactic Acid, Venous: 2.8 mmol/L (ref 0.5–1.9)
Lactic Acid, Venous: 2 mmol/L (ref 0.5–1.9)
Lactic Acid, Venous: 1.7 mmol/L (ref 0.5–1.9)

## 2017-05-20 LAB — CBC WITH DIFFERENTIAL/PLATELET
Basophils Absolute: 0 10*3/uL (ref 0–0.1)
Basophils Relative: 0 %
Eosinophils Absolute: 0 10*3/uL (ref 0–0.7)
Eosinophils Relative: 0 %
HCT: 34.7 % — ABNORMAL LOW (ref 40.0–52.0)
Hemoglobin: 11.8 g/dL — ABNORMAL LOW (ref 13.0–18.0)
Lymphocytes Relative: 11 %
Lymphs Abs: 1 10*3/uL (ref 1.0–3.6)
MCH: 35.5 pg — ABNORMAL HIGH (ref 26.0–34.0)
MCHC: 33.9 g/dL (ref 32.0–36.0)
MCV: 104.6 fL — ABNORMAL HIGH (ref 80.0–100.0)
Monocytes Absolute: 2 10*3/uL — ABNORMAL HIGH (ref 0.2–1.0)
Monocytes Relative: 23 %
Neutro Abs: 5.9 10*3/uL (ref 1.4–6.5)
Neutrophils Relative %: 66 %
Platelets: 103 10*3/uL — ABNORMAL LOW (ref 150–440)
RBC: 3.31 MIL/uL — ABNORMAL LOW (ref 4.40–5.90)
RDW: 15.1 % — ABNORMAL HIGH (ref 11.5–14.5)
WBC: 8.9 10*3/uL (ref 3.8–10.6)

## 2017-05-20 LAB — COMPREHENSIVE METABOLIC PANEL
ALT: 13 U/L — ABNORMAL LOW (ref 17–63)
AST: 24 U/L (ref 15–41)
Albumin: 3.4 g/dL — ABNORMAL LOW (ref 3.5–5.0)
Alkaline Phosphatase: 39 U/L (ref 38–126)
Anion gap: 9 (ref 5–15)
BUN: 30 mg/dL — ABNORMAL HIGH (ref 6–20)
CO2: 27 mmol/L (ref 22–32)
Calcium: 9.5 mg/dL (ref 8.9–10.3)
Chloride: 103 mmol/L (ref 101–111)
Creatinine, Ser: 1.32 mg/dL — ABNORMAL HIGH (ref 0.61–1.24)
GFR calc Af Amer: >60 mL/min (ref >60)
GFR calc non Af Amer: 57 mL/min — ABNORMAL LOW (ref >60)
Glucose, Bld: 93 mg/dL (ref 65–99)
Potassium: 4.6 mmol/L (ref 3.5–5.1)
Sodium: 139 mmol/L (ref 135–145)
Total Bilirubin: 1.1 mg/dL (ref 0.3–1.2)
Total Protein: 7.3 g/dL (ref 6.5–8.1)

## 2017-05-20 LAB — PROTIME-INR
INR: 1.64
INR: 1.47
Prothrombin Time: 19.6 seconds — ABNORMAL HIGH (ref 11.4–15.2)
Prothrombin Time: 18 seconds — ABNORMAL HIGH (ref 11.4–15.2)

## 2017-05-20 LAB — MRSA PCR SCREENING: MRSA by PCR: NEGATIVE

## 2017-05-20 LAB — CREATININE, SERUM
Creatinine, Ser: 0.98 mg/dL (ref 0.61–1.24)
GFR calc Af Amer: >60 mL/min (ref >60)
GFR calc non Af Amer: >60 mL/min (ref >60)

## 2017-05-20 LAB — APTT: aPTT: 48 seconds — ABNORMAL HIGH (ref 24–36)

## 2017-05-20 LAB — PROCALCITONIN: Procalcitonin: 0.6 ng/mL

## 2017-05-20 MED ORDER — SODIUM CHLORIDE 0.9 % IV SOLN
INTRAVENOUS | Status: DC
  Administered 2017-05-20 – 2017-05-22 (×4): via INTRAVENOUS

## 2017-05-20 MED ORDER — SODIUM CHLORIDE 0.9 % IV BOLUS (SEPSIS)
1000.0000 mL | Freq: Once | INTRAVENOUS | Status: AC
  Administered 2017-05-20: 1000 mL via INTRAVENOUS

## 2017-05-20 MED ORDER — ATORVASTATIN CALCIUM 10 MG PO TABS
10.0000 mg | ORAL_TABLET | Freq: Every day | ORAL | Status: DC
  Administered 2017-05-21 – 2017-05-23 (×3): 10 mg via ORAL
  Filled 2017-05-20 (×3): qty 1

## 2017-05-20 MED ORDER — VANCOMYCIN HCL IN DEXTROSE 1-5 GM/200ML-% IV SOLN
1000.0000 mg | Freq: Once | INTRAVENOUS | Status: AC
  Administered 2017-05-20: 1000 mg via INTRAVENOUS
  Filled 2017-05-20: qty 200

## 2017-05-20 MED ORDER — KETOROLAC TROMETHAMINE 30 MG/ML IJ SOLN: 30.0000 mg | Freq: Four times a day (QID) | INTRAMUSCULAR | Status: DC | PRN

## 2017-05-20 MED ORDER — VANCOMYCIN HCL IN DEXTROSE 1-5 GM/200ML-% IV SOLN
1000.0000 mg | INTRAVENOUS | Status: AC
  Administered 2017-05-20: 1000 mg via INTRAVENOUS
  Filled 2017-05-20: qty 200

## 2017-05-20 MED ORDER — ACETAMINOPHEN 325 MG PO TABS: 650.0000 mg | ORAL_TABLET | Freq: Four times a day (QID) | ORAL | Status: DC | PRN

## 2017-05-20 MED ORDER — DEXTROSE 5 % IV SOLN
2.0000 g | Freq: Two times a day (BID) | INTRAVENOUS | Status: DC
  Administered 2017-05-20 – 2017-05-23 (×6): 2 g via INTRAVENOUS
  Filled 2017-05-20 (×7): qty 2

## 2017-05-20 MED ORDER — VANCOMYCIN HCL IN DEXTROSE 1-5 GM/200ML-% IV SOLN
1000.0000 mg | Freq: Two times a day (BID) | INTRAVENOUS | Status: DC
  Administered 2017-05-21 (×2): 1000 mg via INTRAVENOUS
  Filled 2017-05-20 (×3): qty 200

## 2017-05-20 MED ORDER — RISPERIDONE 1 MG PO TABS
4.0000 mg | ORAL_TABLET | Freq: Every day | ORAL | Status: DC
  Administered 2017-05-21 – 2017-05-22 (×2): 4 mg via ORAL
  Filled 2017-05-20 (×2): qty 4
  Filled 2017-05-20: qty 2
  Filled 2017-05-20: qty 4

## 2017-05-20 MED ORDER — ACETAMINOPHEN 650 MG RE SUPP: 650.0000 mg | Freq: Four times a day (QID) | RECTAL | Status: DC | PRN

## 2017-05-20 MED ORDER — AMIODARONE HCL 200 MG PO TABS
100.0000 mg | ORAL_TABLET | Freq: Every day | ORAL | Status: DC
  Administered 2017-05-20 – 2017-05-21 (×2): 100 mg via ORAL
  Filled 2017-05-20 (×2): qty 1

## 2017-05-20 MED ORDER — PIPERACILLIN-TAZOBACTAM 3.375 G IVPB: 3.3750 g | Freq: Three times a day (TID) | INTRAVENOUS | Status: DC

## 2017-05-20 MED ORDER — METOPROLOL TARTRATE 5 MG/5ML IV SOLN
5.0000 mg | INTRAVENOUS | Status: DC | PRN
  Filled 2017-05-20: qty 5

## 2017-05-20 MED ORDER — DONEPEZIL HCL 5 MG PO TABS
5.0000 mg | ORAL_TABLET | Freq: Every day | ORAL | Status: DC
  Administered 2017-05-21 – 2017-05-22 (×2): 5 mg via ORAL
  Filled 2017-05-20 (×4): qty 1

## 2017-05-20 MED ORDER — SODIUM CHLORIDE 0.9 % IV BOLUS (SEPSIS)
500.0000 mL | Freq: Once | INTRAVENOUS | Status: AC
  Administered 2017-05-20: 500 mL via INTRAVENOUS

## 2017-05-20 MED ORDER — PIPERACILLIN-TAZOBACTAM 3.375 G IVPB
3.3750 g | Freq: Once | INTRAVENOUS | Status: AC
  Administered 2017-05-20: 3.375 g via INTRAVENOUS
  Filled 2017-05-20: qty 50

## 2017-05-20 MED ORDER — SODIUM CHLORIDE 0.9 % IV BOLUS (SEPSIS)
1000.0000 mL | Freq: Once | INTRAVENOUS | Status: AC
Start: 2017-05-20 — End: 2017-05-20
  Administered 2017-05-20: 1000 mL via INTRAVENOUS

## 2017-05-20 MED ORDER — ALBUTEROL SULFATE (2.5 MG/3ML) 0.083% IN NEBU: 2.5000 mg | INHALATION_SOLUTION | RESPIRATORY_TRACT | Status: DC | PRN

## 2017-05-20 MED ORDER — SENNOSIDES-DOCUSATE SODIUM 8.6-50 MG PO TABS: 1.0000 | ORAL_TABLET | Freq: Every evening | ORAL | Status: DC | PRN

## 2017-05-20 MED ORDER — HYDROCODONE-ACETAMINOPHEN 5-325 MG PO TABS: 1.0000 | ORAL_TABLET | ORAL | Status: DC | PRN

## 2017-05-20 MED ORDER — ONDANSETRON HCL 4 MG PO TABS: 4.0000 mg | ORAL_TABLET | Freq: Four times a day (QID) | ORAL | Status: DC | PRN

## 2017-05-20 MED ORDER — METOPROLOL TARTRATE 5 MG/5ML IV SOLN
5.0000 mg | Freq: Once | INTRAVENOUS | Status: AC
  Administered 2017-05-20: 5 mg via INTRAVENOUS

## 2017-05-20 MED ORDER — APIXABAN 5 MG PO TABS
5.0000 mg | ORAL_TABLET | Freq: Two times a day (BID) | ORAL | Status: DC
  Administered 2017-05-21 – 2017-05-23 (×5): 5 mg via ORAL
  Filled 2017-05-20 (×5): qty 1

## 2017-05-20 MED ORDER — METOPROLOL TARTRATE 5 MG/5ML IV SOLN
INTRAVENOUS | Status: AC
  Filled 2017-05-20: qty 5

## 2017-05-20 MED ORDER — BISACODYL 5 MG PO TBEC: 5.0000 mg | DELAYED_RELEASE_TABLET | Freq: Every day | ORAL | Status: DC | PRN

## 2017-05-20 MED ORDER — ONDANSETRON HCL 4 MG/2ML IJ SOLN: 4.0000 mg | Freq: Four times a day (QID) | INTRAMUSCULAR | Status: DC | PRN

## 2017-05-20 MED ORDER — VANCOMYCIN HCL IN DEXTROSE 1-5 GM/200ML-% IV SOLN
1000.0000 mg | INTRAVENOUS | Status: DC
  Filled 2017-05-20: qty 200

## 2017-05-20 MED ORDER — AMIODARONE HCL 200 MG PO TABS: 100.0000 mg | ORAL_TABLET | Freq: Every day | ORAL | Status: DC

## 2017-05-20 NOTE — ED Provider Notes (Signed)
[facility] Emergency Department Provider Note   ____________________________________________    I have reviewed the triage vital signs and the nursing notes.   HISTORY  Chief Complaint Code Sepsis  Patient is unable to provide any significant history   HPI Brian Dodson is a 61 y.o. male with a history of atrial fibrillation, CAD, COPD, diabetes, schizophrenia who presents with altered mental status. Patient is from group home, group home reports that over the last 2 days he has seemed "sick". He has become more confused and acting unusually and then admitted that he had a fever today. No cough reported. They report he has been having incontinence. Patient is unable to provide any history   Past Medical History:  Diagnosis Date  . Asthma   . Atrial fibrillation (HCC)   . Benign prostatic hypertrophy with lower urinary tract symptoms (LUTS)   . Bulbous urethral stricture   . CAD (coronary artery disease)    s/p coronary stent 2003  . Constipation   . COPD (chronic obstructive pulmonary disease) (HCC)   . Diabetes mellitus without complication (HCC)   . Dizziness   . Dyspnea   . GERD (gastroesophageal reflux disease)   . Gross hematuria   . Hyperlipemia   . Hypertension   . Lumbago   . Palpitation   . Schizophrenia [facility])     Patient Active Problem List   Diagnosis Date Noted  . Atrial fibrillation with RVR (HCC) 05/23/2015  . Syncope 05/23/2015  . Schizophrenia (HCC) 05/23/2015  . Diabetes mellitus, type II (HCC) 05/23/2015  . HTN (hypertension) 05/23/2015  . GERD (gastroesophageal reflux disease) 05/23/2015  . CAD (coronary artery disease) 05/23/2015    Past Surgical History:  Procedure Laterality Date  . COLONOSCOPY    . CORONARY ANGIOPLASTY WITH STENT PLACEMENT  11/2003  . kidney stent      Prior to Admission medications   Medication Sig Start Date End Date Taking? Authorizing Provider  amiodarone (PACERONE) 200 MG tablet  Take 200 mg by mouth daily. 05/18/15   [provider]  aspirin EC 81 MG tablet Take 81 mg by mouth daily.    [provider]  atorvastatin (LIPITOR) 10 MG tablet Take 10 mg by mouth daily. 05/18/15   [provider]  benztropine (COGENTIN) 1 MG tablet Take 1 mg by mouth at bedtime. 05/18/15   [provider]  citalopram (CELEXA) 20 MG tablet Take 20 mg by mouth at bedtime. 05/18/15   [provider]  divalproex (DEPAKOTE ER) 500 MG 24 hr tablet Take 2,000 mg by mouth at bedtime. 05/18/15   [provider]  ELIQUIS 5 MG TABS tablet Take 5 mg by mouth 2 (two) times daily. 05/18/15   [provider]  hydrochlorothiazide (HYDRODIURIL) 25 MG tablet  05/18/15   [provider]  isosorbide mononitrate (IMDUR) 30 MG 24 hr tablet Take 30 mg by mouth daily. 04/27/15   [provider]  metFORMIN (GLUCOPHAGE) 500 MG tablet Take 1,000 mg by mouth 2 (two) times daily. 05/18/15   [provider]  metoprolol succinate (TOPROL-XL) 25 MG 24 hr tablet Reported on 03/14/2016 03/06/16   [provider]  metoprolol succinate (TOPROL-XL) 50 MG 24 hr tablet Take 50 mg by mouth daily. 05/18/15   [provider]  ramipril (ALTACE) 5 MG capsule  05/18/15   [provider]  ranitidine (ZANTAC) 150 MG tablet Take 150 mg by mouth 2 (two) times daily. 04/22/15   [provider]  risperiDONE (RISPERDAL) 1 MG tablet Take 1 mg by mouth at bedtime. Reported on 03/14/2016 05/18/15   [provider]  risperidone (RISPERDAL) 4 MG tablet Take 4 mg by mouth at bedtime. 05/18/15   [provider]     Allergies Patient has no known allergies.  Family History  Problem Relation Age of Onset  . Diabetes Unknown   . Bladder Cancer Neg Hx   . Kidney cancer Neg Hx   . Prostate cancer Neg Hx     Social History Social History  Substance Use Topics  . Smoking status: Former Games developermoker  . Smokeless tobacco: Never  Used  . Alcohol use No    Review of Systems  Constitutional: Fever at group home of 101.5 Eyes: No visual changes reported ENT: No reports of sore throat. Cardiovascular: No chest pain reported by staff  Respiratory: No reports of cough Gastrointestinal:  no vomiting.   Genitourinary: Episodes of incontinence over the last day Musculoskeletal: No reports of joint swelling Skin: Negative for rash. Neurological: Negative for headaches   ____________________________________________   PHYSICAL EXAM:  VITAL SIGNS: ED Triage Vitals [05/20/17 1011]  Enc Vitals Group     BP (!) 80/52     Pulse Rate (!) 113     Resp (!) 22     Temp 99.4 F (37.4 C)     Temp Source Oral     SpO2 92 %     Weight 102.1 kg (225 lb)     Height      Head Circumference      Peak Flow      Pain Score      Pain Loc      Pain Edu?      Excl. in GC?     Constitutional: Alert. Lethargic but arousable Eyes: Conjunctivae are normal.  Head: Atraumatic. Nose: No congestion/rhinnorhea. Mouth/Throat: Mucous membranes are dry Neck:  Painless ROM Cardiovascular: Tachycardia, irregularly irregular rhythm. Grossly normal heart sounds.  Good peripheral circulation. Respiratory: Normal respiratory effort.  No retractions. Lungs CTAB. Gastrointestinal: Soft and nontender. No distention.  No CVA tenderness. Genitourinary: deferred Musculoskeletal:   Warm and well perfused Neurologic:  Normal speech and language. No gross focal neurologic deficits are appreciated.  Skin:  Skin is warm, dry and intact. No rash noted. Psychiatric: Mood and affect are normal. Speech and behavior are normal.  ____________________________________________   LABS (all labs ordered are listed, but only abnormal results are displayed)  Labs Reviewed  COMPREHENSIVE METABOLIC PANEL - Abnormal; Notable for the following:       Result Value   BUN 30 (*)    Creatinine, Ser 1.32 (*)    Albumin 3.4 (*)    ALT 13 (*)    GFR calc non  Af Amer 57 (*)    All other components within normal limits  CBC WITH DIFFERENTIAL/PLATELET - Abnormal; Notable for the following:    RBC 3.31 (*)    Hemoglobin 11.8 (*)    HCT 34.7 (*)    MCV 104.6 (*)    MCH 35.5 (*)    RDW 15.1 (*)    Platelets 103 (*)    Monocytes Absolute 2.0 (*)    All other components within normal limits  PROTIME-INR - Abnormal; Notable for the following:    Prothrombin Time 19.6 (*)    All other components within normal limits  URINALYSIS, COMPLETE (UACMP) WITH MICROSCOPIC - Abnormal; Notable for the following:    Color, Urine AMBER (*)  APPearance CLEAR (*)    Protein, ur 30 (*)    Squamous Epithelial / LPF 0-5 (*)    All other components within normal limits  CULTURE, BLOOD (ROUTINE X 2)  CULTURE, BLOOD (ROUTINE X 2)  URINE CULTURE  LACTIC ACID, PLASMA  LACTIC ACID, PLASMA   ____________________________________________  EKG  ED ECG REPORT I, Jene Every, the attending physician, personally viewed and interpreted this ECG.  Date: 05/20/2017 EKG Time: 10:21 AM Rate: 148 Rhythm: Atrial fibrillation QRS Axis: normal Intervals: normal ST/T Wave abnormalities: normal Narrative Interpretation: A. fib with RVR  ____________________________________________  RADIOLOGY  Chest x-ray shows new left lower lobe opacity, aspiration versus pneumonia ____________________________________________   PROCEDURES  Procedure(s) performed: No    Critical Care performed: yes  CRITICAL CARE Performed by: Jene Every   Total critical care time: 30 minutes  Critical care time was exclusive of separately billable procedures and treating other patients.  Critical care was necessary to treat or prevent imminent or life-threatening deterioration.  Critical care was time spent personally by me on the following activities: development of treatment plan with patient and/or surrogate as well as nursing, discussions with consultants, evaluation of  patient's response to treatment, examination of patient, obtaining history from patient or surrogate, ordering and performing treatments and interventions, ordering and review of laboratory studies, ordering and review of radiographic studies, pulse oximetry and re-evaluation of patient's condition.  ____________________________________________   INITIAL IMPRESSION / ASSESSMENT AND PLAN / ED COURSE  Pertinent labs & imaging results that were available during my care of the patient were reviewed by me and considered in my medical decision making (see chart for details).  Presents with altered mental status, he is febrile tachycardic and hypotensive. Code sepsis called. We will give 30 MLS per kilogram and broad-spectrum antibiotics. Lab work test x-ray pending. Patient's heart rate is elevated, he has atrial fibrillation with RVR likely related to sepsis. Will monitor response to fluids carefully  ----------------------------------------- 11:14 AM on 05/20/2017 -----------------------------------------  Heart rate and blood pressure improving with fluids. Chest x-ray demonstrates possible pneumonia versus aspiration. Broad-spectrum antibiotics infusing. Urinalysis unremarkable. We'll discuss with hospitalist for admission    ____________________________________________   FINAL CLINICAL IMPRESSION(S) / ED DIAGNOSES  Final diagnoses:  Sepsis, due to unspecified organism [facility])  Atrial fibrillation with rapid ventricular response (HCC)      NEW MEDICATIONS STARTED DURING THIS VISIT:  New Prescriptions   No medications on file     Note:  This document was prepared using Dragon voice recognition software and may include unintentional dictation errors.    Jene Every, MD 05/20/17 1115

## 2017-05-20 NOTE — ED Notes (Signed)
Pt placed on 2L nasal cannula.  MD aware.

## 2017-05-20 NOTE — ED Notes (Signed)
Code  Sepsis  Called  To  Brian Dodson at  [email or web address]

## 2017-05-20 NOTE — ED Notes (Signed)
Collected CBC, CMP, Lactic Acid R AC, sent to lab.

## 2017-05-20 NOTE — Progress Notes (Signed)
CH. responded to RR. Pt. was stabilized by staff. CH. spoke with Pt. briefly as he is recovering from meds.

## 2017-05-20 NOTE — H&P (Signed)
[facility] [place] at [facility]   PATIENT NAME: Brian Dodson    MR#:  [id number]  DATE OF BIRTH:  09-10-1956  DATE OF ADMISSION:  05/20/2017  PRIMARY CARE PHYSICIAN: Sherron Monday, MD   REQUESTING/REFERRING PHYSICIAN: Jene Every, MD  CHIEF COMPLAINT:   Chief Complaint  Patient presents with  . Code Sepsis   Fever and confusion today. HISTORY OF PRESENT ILLNESS:  Carlous Olivares  is a 61 y.o. male with a known history of A. fib, CAD, COPD, diabetes and schizophrenia. The patient was sent from group home to the ED due to above chief complaints. The patient was reported sick for the past 2 days. He has become more confused and had a fever today. The patient is confused and unable to provide any information. He was found tachycardia and tachypnea. Chest x-ray show left side pneumonia, treated with vancomycin and Zosyn in the ED. PAST MEDICAL HISTORY:   Past Medical History:  Diagnosis Date  . Asthma   . Atrial fibrillation (HCC)   . Benign prostatic hypertrophy with lower urinary tract symptoms (LUTS)   . Bulbous urethral stricture   . CAD (coronary artery disease)    s/p coronary stent 2003  . Constipation   . COPD (chronic obstructive pulmonary disease) (HCC)   . Diabetes mellitus without complication (HCC)   . Dizziness   . Dyspnea   . GERD (gastroesophageal reflux disease)   . Gross hematuria   . Hyperlipemia   . Hypertension   . Lumbago   . Palpitation   . Schizophrenia (HCC)     PAST SURGICAL HISTORY:   Past Surgical History:  Procedure Laterality Date  . COLONOSCOPY    . CORONARY ANGIOPLASTY WITH STENT PLACEMENT  11/2003  . kidney stent      SOCIAL HISTORY:   Social History  Substance Use Topics  . Smoking status: Former Games developer  . Smokeless tobacco: Never Used  . Alcohol use No    FAMILY HISTORY:   Family History  Problem Relation Age of Onset  . Diabetes Unknown   . Bladder Cancer Neg Hx   . Kidney cancer Neg Hx   .  Prostate cancer Neg Hx     DRUG ALLERGIES:  No Known Allergies  REVIEW OF SYSTEMS:   Review of Systems  Unable to perform ROS: Mental status change    MEDICATIONS AT HOME:   Prior to Admission medications   Medication Sig Start Date End Date Taking? Authorizing Provider  amiodarone (PACERONE) 200 MG tablet Take 100 mg by mouth daily.  05/18/15  Yes [provider]  atorvastatin (LIPITOR) 10 MG tablet Take 10 mg by mouth daily. 05/18/15  Yes [provider]  donepezil (ARICEPT) 5 MG tablet Take 5 mg by mouth at bedtime.   Yes [provider]  ELIQUIS 5 MG TABS tablet Take 5 mg by mouth 2 (two) times daily. 05/18/15  Yes [provider]  risperidone (RISPERDAL) 4 MG tablet Take 4 mg by mouth at bedtime. 05/18/15  Yes [provider]      VITAL SIGNS:  Blood pressure 102/74, pulse (!) 113, temperature 99.4 F (37.4 C), temperature source Oral, resp. rate 20, weight 225 lb (102.1 kg), SpO2 99 %.  PHYSICAL EXAMINATION:  Physical Exam  GENERAL:  61 y.o.-year-old patient lying in the bed with no acute distress.  EYES: Pupils equal, round, reactive to light and accommodation. No scleral icterus. Extraocular muscles intact.  HEENT: Head atraumatic, normocephalic. Oropharynx  and nasopharynx clear.  NECK:  Supple, no jugular venous distention. No thyroid enlargement, no tenderness.  LUNGS: Normal breath sounds bilaterally, no wheezing, rales,rhonchi or crepitation. No use of accessory muscles of respiration.  CARDIOVASCULAR: S1, S2 normal. No murmurs, rubs, or gallops.  ABDOMEN: Soft, nontender, nondistended. Bowel sounds present. No organomegaly or mass.  EXTREMITIES: No pedal edema, cyanosis, or clubbing.  NEUROLOGIC: Cranial nerves II through XII are intact. Muscle strength 5/5 in all extremities. Sensation intact. Gait not checked.  PSYCHIATRIC: The patient is Confused and demented.  SKIN: No obvious rash, lesion, or ulcer.   LABORATORY  PANEL:   CBC  Recent Labs Lab 05/20/17 1027  WBC 8.9  HGB 11.8*  HCT 34.7*  PLT 103*   ------------------------------------------------------------------------------------------------------------------  Chemistries   Recent Labs Lab 05/20/17 1027  NA 139  K 4.6  CL 103  CO2 27  GLUCOSE 93  BUN 30*  CREATININE 1.32*  CALCIUM 9.5  AST 24  ALT 13*  ALKPHOS 39  BILITOT 1.1   ------------------------------------------------------------------------------------------------------------------  Cardiac Enzymes No results for input(s): TROPONINI in the last 168 hours. ------------------------------------------------------------------------------------------------------------------  RADIOLOGY:  Dg Chest Port 1 View  Result Date: 05/20/2017 CLINICAL DATA:  Increased fever with altered mental status and hypoxemia. Unresponsive. EXAM: PORTABLE CHEST 1 VIEW COMPARISON:  05/23/2015. FINDINGS: 1037 hour. Lordotic positioning and mild patient rotation to the left. The heart size and mediastinal contours are stable. There is poor definition of the left hemidiaphragm with patchy left basilar pulmonary opacity. The right lung appears clear. There is no pleural effusion or pneumothorax. IMPRESSION: New patchy left basilar opacity, partly obscuring the left hemidiaphragm. This could reflect atelectasis or aspiration. Electronically Signed   By: Carey BullocksWilliam  Veazey M.D.   On: 05/20/2017 10:52      IMPRESSION AND PLAN:   Sepsis with Pneumonia, HAP The patient will be admitted to medical floor. The patient is treated with vancomycin dosing in the ED. Continue vancomycin, cefepime pharmacy to dose. Follow-up CBC and cultures.  A. fib with RVR. Due to sepsis. Continue amiodarone and Eliquis.  Lactic acidosis. Treatment as above, follow-up lactic acid level.  All the records are reviewed and case discussed with ED provider. Management plans discussed with the patient, family and they are in  agreement.  CODE STATUS: Full code  TOTAL TIME TAKING CARE OF THIS PATIENT: 53 minutes.    Shaune Pollackhen, Qing M.D on 05/20/2017 at 11:59 AM  Between 7am to 6pm - Pager - [phone]  After 6pm go to [email or web address] - Social research officer, governmentpassword EPAS [email or web address]  [facility]  Office  [phone]  CC: Primary care physician; Sherron Mondayejan-Sie, S Ahmed, MD   Note: This dictation was prepared with Dragon dictation along with smaller phrase technology. Any transcriptional errors that result from this process are unintentional.

## 2017-05-20 NOTE — ED Triage Notes (Signed)
[facility] who reports pt has had increased fever, altered mental status, low oxygenation. and smells of urine.  Pt responsive to verbal stimuli,  Per care givers pt not oriented and with abnormal behavior.  Per caregiver, pt temp at 8 am was 101, caregiver gave tylenol and now temp 99.4 at triage.

## 2017-05-20 NOTE — Care Management Note (Signed)
Case Management Note  Patient Details  Name: Brian Dodson MRN: [id number] Date of Birth: 05/19/1956  Subjective/Objective:                   RNCM spoke with caregiver Venita SheffieldGladys [facility]. There are 10 residents at this facility and operated by [facility]. Patient's guardian is listed as Lazaro ArmsKay Flack [facility]. Per Venita SheffieldGladys they will need an FL2 at discharge- CSW consult placed.  Venita SheffieldGladys states that patient is independent with mobility. He has no DME in the home. She states that she "took him to church yesterday". She is not familiar with any home health agencies as she states they have never needed those services. Action/Plan: RNCM will continue to follow.   Expected Discharge Date:                  Expected Discharge Plan:     In-House Referral:     Discharge planning Services  CM Consult  Post Acute Care Choice:  [facility] Health [facility]  [facility]  DME Arranged:    DME Agency:     HH Arranged:    HH Agency:     Status of Service:  In process, will continue to follow  If discussed at Long Length of Stay Meetings, dates discussed:    Additional Comments:  Collie Siadngela Johnson, RN 05/20/2017, 2:02 PM

## 2017-05-20 NOTE — Progress Notes (Signed)
Patient's HR rose into the 170's and sustained for several minutes.  He was up to the commode and sitting at the side of the bed.  This is the second instance that his heart rat has climbed with activity

## 2017-05-20 NOTE — ED Notes (Signed)
This RN spoke with pt guardian, Lazaro ArmsKay Flack with DSS [phone].  She gives permission to treat at this time.  Fax number provided; states she will fax guardianship paperwork.  Registration notified.

## 2017-05-20 NOTE — ED Notes (Addendum)
Issue with pyxis, unable to pull antibiotics, pharmacy notified to send dose of Zosyn and Vanc.

## 2017-05-20 NOTE — Plan of Care (Signed)
Problem: Education: Goal: Knowledge of [facility] Education information/materials will improve Outcome: Progressing Needs further reinforcement on how the unit and hospital function.

## 2017-05-20 NOTE — ED Notes (Signed)
Pt found to be trying to get out of bed, pt repositioned back into bed and reoriented to situation and call bell.  Bed alarm placed on pt at this time.

## 2017-05-20 NOTE — Progress Notes (Signed)
Afib RVR with HR in the 180's consistently, with increases to the 190's.  He was up at the side of the bed eating dinner.  Put him back to bed supine.  HR remained in 180's - 190's.  Dr. Imogene Burnhen paged for direction.  Rapid response called for the sustained tachycardia. Metoprolol 5 mg. Slow IVP.  HR reduced to 120's.  BP stable at 102/

## 2017-05-20 NOTE — Progress Notes (Addendum)
Pharmacy Antibiotic Note  Brian Dodson is a 61 y.o. male admitted on 05/20/2017 with sepsis.  Pharmacy has been consulted for Vancomycin and Cefepime dosing.  Plan:  PK parameters (estimated): ke= 0.055; t1/2: 12.6 hours  VD: 58.5 L  Patient received Vancomycin 1 g IV x 1 in ED. Gave an additional 1 g to = 2000 mg (load of Vancomycin). Will start Vancomycin 1 g IV q12 hours starting @ 02:00 on 7/3.  Vancomycin level order to be drawn prior to the 5th dose of Vancomycin regimen.  Patient had previously order of Zosyn in ED. MD Imogene Burnhen  Wants to start Cefepime 2 g IV q8 hours.   Height: 5\' 10"  (177.8 cm) Weight: 184 lb 6.4 oz (83.6 kg) IBW/kg (Calculated) : 73  Temp (24hrs), Avg:99.4 F (37.4 C), Min:99.3 F (37.4 C), Max:99.4 F (37.4 C)   Recent Labs Lab 05/20/17 1013 05/20/17 1027 05/20/17 1443 05/20/17 1743  WBC  --  8.9  --   --   CREATININE  --  1.32* 0.98  --   LATICACIDVEN 2.8*  --   --  2.0*    Estimated Creatinine Clearance: 81.7 mL/min (by C-G formula based on SCr of 0.98 mg/dL).    No Known Allergies  Antimicrobials this admission: VAncomycin 7/2 >>  Zosyn  7/2 >>   Dose adjustments this admission:   Microbiology results:  BCx:   UCx:   Sputum:    MRSA PCR:   Thank you for allowing pharmacy to be a part of this patient's care.  James,Teldrin D 05/20/2017 3:51 PM

## 2017-05-21 LAB — CBC
HCT: 29.9 % — ABNORMAL LOW (ref 40.0–52.0)
Hemoglobin: 10.2 g/dL — ABNORMAL LOW (ref 13.0–18.0)
MCH: 35.3 pg — ABNORMAL HIGH (ref 26.0–34.0)
MCHC: 34 g/dL (ref 32.0–36.0)
MCV: 103.7 fL — ABNORMAL HIGH (ref 80.0–100.0)
Platelets: 93 10*3/uL — ABNORMAL LOW (ref 150–440)
RBC: 2.88 MIL/uL — ABNORMAL LOW (ref 4.40–5.90)
RDW: 15.3 % — ABNORMAL HIGH (ref 11.5–14.5)
WBC: 7.9 10*3/uL (ref 3.8–10.6)

## 2017-05-21 LAB — URINE CULTURE

## 2017-05-21 LAB — BASIC METABOLIC PANEL
Anion gap: 2 — ABNORMAL LOW (ref 5–15)
BUN: 25 mg/dL — ABNORMAL HIGH (ref 6–20)
CO2: 26 mmol/L (ref 22–32)
Calcium: 8 mg/dL — ABNORMAL LOW (ref 8.9–10.3)
Chloride: 108 mmol/L (ref 101–111)
Creatinine, Ser: 0.9 mg/dL (ref 0.61–1.24)
GFR calc Af Amer: >60 mL/min (ref >60)
GFR calc non Af Amer: >60 mL/min (ref >60)
Glucose, Bld: 79 mg/dL (ref 65–99)
Potassium: 4.1 mmol/L (ref 3.5–5.1)
Sodium: 136 mmol/L (ref 135–145)

## 2017-05-21 LAB — HIV ANTIBODY (ROUTINE TESTING W REFLEX): HIV Screen 4th Generation wRfx: NONREACTIVE

## 2017-05-21 MED ORDER — SODIUM CHLORIDE 0.9 % IV BOLUS (SEPSIS)
500.0000 mL | Freq: Once | INTRAVENOUS | Status: AC
  Administered 2017-05-21: 500 mL via INTRAVENOUS

## 2017-05-21 MED ORDER — AMIODARONE HCL 200 MG PO TABS
200.0000 mg | ORAL_TABLET | Freq: Every day | ORAL | Status: DC
  Administered 2017-05-22 – 2017-05-23 (×2): 200 mg via ORAL
  Filled 2017-05-21 (×2): qty 1

## 2017-05-21 NOTE — Progress Notes (Signed)
Spoke with dr. Cherlynn Kaisersainani to make aware bp 88/59 (64) hr 110-120. Patient resting in bed no distress noted. Per md given 500cc bolus will continue to monitor closely

## 2017-05-21 NOTE — Progress Notes (Signed)
Brian Dodson at [place] NAME: Brian Dodson    MR#:  [id number]  DATE OF BIRTH:  05-25-1956  SUBJECTIVE:   Patient here due to suspected sepsis from pneumonia. Patient is a poor historian. No fever today. No other acute events overnight.  REVIEW OF SYSTEMS:    Review of Systems  Unable to perform ROS: Mental acuity    Nutrition: Heart healthy Tolerating Diet: yes Tolerating PT: Await Eval.   DRUG ALLERGIES:  No Known Allergies  VITALS:  Blood pressure 109/71, pulse (!) 101, temperature 98.2 F (36.8 C), resp. rate 20, height '5\' 10"'$  (1.778 m), weight 83.6 kg (184 lb 6.4 oz), SpO2 97 %.  PHYSICAL EXAMINATION:   Physical Exam  GENERAL:  61 y.o.-year-old patient lying in bed in no acute distress.  EYES: Pupils equal, round, reactive to light and accommodation. No scleral icterus. Extraocular muscles intact.  HEENT: Head atraumatic, normocephalic. Oropharynx and nasopharynx clear.  NECK:  Supple, no jugular venous distention. No thyroid enlargement, no tenderness.  LUNGS: Poor Resp. effort, no wheezing, rales, rhonchi. No use of accessory muscles of respiration.  CARDIOVASCULAR: S1, S2 normal. No murmurs, rubs, or gallops.  ABDOMEN: Soft, nontender, nondistended. Bowel sounds present. No organomegaly or mass.  EXTREMITIES: No cyanosis, clubbing or edema b/l.    NEUROLOGIC: Cranial nerves II through XII are intact. No focal Motor or sensory deficits b/l.   PSYCHIATRIC: The patient is alert and oriented x 1.  SKIN: No obvious rash, lesion, or ulcer.    LABORATORY PANEL:   CBC  Recent Labs Lab 05/21/17 0548  WBC 7.9  HGB 10.2*  HCT 29.9*  PLT 93*   ------------------------------------------------------------------------------------------------------------------  Chemistries   Recent Labs Lab 05/20/17 1027  05/21/17 0548  NA 139  --  136  K 4.6  --  4.1  CL 103  --  108  CO2 27  --  26  GLUCOSE 93  --  79  BUN 30*  --   25*  CREATININE 1.32*  < > 0.90  CALCIUM 9.5  --  8.0*  AST 24  --   --   ALT 13*  --   --   ALKPHOS 39  --   --   BILITOT 1.1  --   --   < > = values in this interval not displayed. ------------------------------------------------------------------------------------------------------------------  Cardiac Enzymes No results for input(s): TROPONINI in the last 168 hours. ------------------------------------------------------------------------------------------------------------------  RADIOLOGY:  Dg Chest Port 1 View  Result Date: 05/20/2017 CLINICAL DATA:  Increased fever with altered mental status and hypoxemia. Unresponsive. EXAM: PORTABLE CHEST 1 VIEW COMPARISON:  05/23/2015. FINDINGS: 1037 hour. Lordotic positioning and mild patient rotation to the left. The heart size and mediastinal contours are stable. There is poor definition of the left hemidiaphragm with patchy left basilar pulmonary opacity. The right lung appears clear. There is no pleural effusion or pneumothorax. IMPRESSION: New patchy left basilar opacity, partly obscuring the left hemidiaphragm. This could reflect atelectasis or aspiration. Electronically Signed   By: Richardean Sale M.D.   On: 05/20/2017 10:52     ASSESSMENT AND PLAN:   61 year old male with past medical history of schizophrenia, diabetes, COPD, coronary artery disease, BPH, hypertension, hyperlipidemia, chronic atrial fibrillation who presented to the hospital due to fever, tachycardia and noted to have chest x-ray findings suggestive pneumonia.  1. Sepsis-patient met criteria admission given the patient's tachycardia, fever and chest x-ray findings suggestive of pneumonia. -Initially treated with IV vancomycin, Cefepime.  MRSA PCR is negative therefore continue Zosyn and DC vancomycin. -Currently afebrile and hemodynamically stable.  2. Pneumonia-suspected source of patient's sepsis. -Continue IV Cefepime, DC vancomycin as MRSA PCR is negative. Follow  cultures.  3. Atrial fibrillation with rapid ventricular response-secondary to the pneumonia and sepsis. -Continue amiodarone, rates are somewhat labile and continue as needed IV metoprolol. - consider digoxin as BP on low side. Await Cards input.  - cont. Eliquis.   4. Hyperlipidemia - cont. Atorvastatin  5. Hx of Schizophrenia/Dementia - cont. Risperdal/Aricept.  6. Thrombocytopenia - ?? Related to sepsis and will monitor.  - no acute bleeding.   All the records are reviewed and case discussed with Care Management/Social Worker. Management plans discussed with the patient, family and they are in agreement.  CODE STATUS: Full code  DVT Prophylaxis: Eliquis  TOTAL TIME TAKING CARE OF THIS PATIENT: 30 minutes.   POSSIBLE D/C IN 1-2 DAYS, DEPENDING ON CLINICAL CONDITION.   Henreitta Leber M.D on 05/21/2017 at 3:12 PM  Between 7am to 6pm - Pager - [phone]  After 6pm go to [email or web address] - Proofreader  [facility]  Office  [phone]  CC: Primary care physician; Jodi Marble, MD

## 2017-05-22 LAB — CBC
HCT: 30.1 % — ABNORMAL LOW (ref 40.0–52.0)
Hemoglobin: 10.1 g/dL — ABNORMAL LOW (ref 13.0–18.0)
MCH: 35.2 pg — ABNORMAL HIGH (ref 26.0–34.0)
MCHC: 33.7 g/dL (ref 32.0–36.0)
MCV: 104.3 fL — ABNORMAL HIGH (ref 80.0–100.0)
Platelets: 101 10*3/uL — ABNORMAL LOW (ref 150–440)
RBC: 2.88 MIL/uL — ABNORMAL LOW (ref 4.40–5.90)
RDW: 15.1 % — ABNORMAL HIGH (ref 11.5–14.5)
WBC: 8.2 10*3/uL (ref 3.8–10.6)

## 2017-05-22 MED ORDER — METOPROLOL TARTRATE 25 MG PO TABS
25.0000 mg | ORAL_TABLET | Freq: Two times a day (BID) | ORAL | Status: DC
  Administered 2017-05-22 – 2017-05-23 (×3): 25 mg via ORAL
  Filled 2017-05-22 (×3): qty 1

## 2017-05-22 MED ORDER — SODIUM CHLORIDE 0.9% FLUSH
3.0000 mL | Freq: Two times a day (BID) | INTRAVENOUS | Status: DC
  Administered 2017-05-22: 3 mL via INTRAVENOUS

## 2017-05-22 NOTE — Clinical Social Work Note (Signed)
CSW received consult that [facility] and he has a legal guardian through DSS, her name is Lazaro ArmsKay Flack [phone].  Patient plans to return back to ALF, formal assessment to follow and FL2 to be prepared day of discharge with updated medications.  Ervin KnackEric R. Anterhaus, MSW, Theresia MajorsLCSWA [phone]  05/22/2017 2:16 PM

## 2017-05-22 NOTE — Progress Notes (Signed)
[facility] [place] NAME: Brian Dodson    MR#:  [id number]  DATE OF BIRTH:  May 04, 1956  SUBJECTIVE:   Patient here due to suspected sepsis from pneumonia. HR a bit labile.  Asymptomatic.   REVIEW OF SYSTEMS:    Review of Systems  Unable to perform ROS: Mental acuity    Nutrition: Heart healthy Tolerating Diet: yes Tolerating PT: Await Eval.   DRUG ALLERGIES:  No Known Allergies  VITALS:  Blood pressure 109/77, pulse 89, temperature 98 F (36.7 C), resp. rate 18, height '5\' 10"'$  (1.778 m), weight 83.6 kg (184 lb 6.4 oz), SpO2 98 %.  PHYSICAL EXAMINATION:   Physical Exam  GENERAL:  61 y.o.-year-old patient lying in bed in no acute distress.  EYES: Pupils equal, round, reactive to light and accommodation. No scleral icterus. Extraocular muscles intact.  HEENT: Head atraumatic, normocephalic. Oropharynx and nasopharynx clear.  NECK:  Supple, no jugular venous distention. No thyroid enlargement, no tenderness.  LUNGS: Poor Resp. effort, no wheezing, rales, rhonchi. No use of accessory muscles of respiration.  CARDIOVASCULAR: S1, S2 normal. No murmurs, rubs, or gallops.  ABDOMEN: Soft, nontender, nondistended. Bowel sounds present. No organomegaly or mass.  EXTREMITIES: No cyanosis, clubbing or edema b/l.    NEUROLOGIC: Cranial nerves II through XII are intact. No focal Motor or sensory deficits b/l.   PSYCHIATRIC: The patient is alert and oriented x 1.  SKIN: No obvious rash, lesion, or ulcer.    LABORATORY PANEL:   CBC  Recent Labs Lab 05/22/17 0434  WBC 8.2  HGB 10.1*  HCT 30.1*  PLT 101*   ------------------------------------------------------------------------------------------------------------------  Chemistries   Recent Labs Lab 05/20/17 1027  05/21/17 0548  NA 139  --  136  K 4.6  --  4.1  CL 103  --  108  CO2 27  --  26  GLUCOSE 93  --  79  BUN 30*  --  25*  CREATININE 1.32*  < > 0.90  CALCIUM 9.5  --   8.0*  AST 24  --   --   ALT 13*  --   --   ALKPHOS 39  --   --   BILITOT 1.1  --   --   < > = values in this interval not displayed. ------------------------------------------------------------------------------------------------------------------  Cardiac Enzymes No results for input(s): TROPONINI in the last 168 hours. ------------------------------------------------------------------------------------------------------------------  RADIOLOGY:  No results found.   ASSESSMENT AND PLAN:   61 year old male with past medical history of schizophrenia, diabetes, COPD, coronary artery disease, BPH, hypertension, hyperlipidemia, chronic atrial fibrillation who presented to the hospital due to fever, tachycardia and noted to have chest x-ray findings suggestive pneumonia.  1. Sepsis-patient met criteria admission given the patient's tachycardia, fever and chest x-ray findings suggestive of pneumonia. -Initially treated with IV vancomycin, Cefepime. MRSA PCR is negative. Cont. IV Cefepime for now.  -Currently afebrile and hemodynamically stable. Cultures (-) so far.   2. Pneumonia-suspected source of patient's sepsis. -Continue IV Cefepime, DC vancomycin as MRSA PCR is negative.  - cultures (-) so far.   3. Atrial fibrillation with rapid ventricular response-secondary to the pneumonia and sepsis. -Patient's rates are somewhat labile stable. Continue amiodarone, metoprolol added by cardiology. -If not improving consider it lowering the patient with digoxin as blood pressure on the low side. Appreciate Cards input.  - cont. Eliquis.   4. Hyperlipidemia - cont. Atorvastatin  5. Hx of Schizophrenia/Dementia - cont. Risperdal/Aricept.  6. Thrombocytopenia - ??  Related to sepsis and improving and will monitor.   All the records are reviewed and case discussed with Care Management/Social Worker. Management plans discussed with the patient, family and they are in agreement.  CODE STATUS:  Full code  DVT Prophylaxis: Eliquis  TOTAL TIME TAKING CARE OF THIS PATIENT: 30 minutes.   POSSIBLE D/C IN 1-2 DAYS, DEPENDING ON CLINICAL CONDITION.   Henreitta Leber M.D on 05/22/2017 at 2:33 PM  Between 7am to 6pm - Pager - [phone]  After 6pm go to [email or web address] - Proofreader  [facility]  Office  [phone]  CC: Primary care physician; Jodi Marble, MD

## 2017-05-22 NOTE — Plan of Care (Signed)
Problem: Respiratory: Goal: Ability to maintain adequate ventilation will improve Outcome: Progressing Weaned down to 1L

## 2017-05-22 NOTE — Progress Notes (Signed)
   05/22/17 1417  Clinical Encounter Type  Visited With Patient  Visit Type Initial  Referral From Chaplain   CH rounding on unit. CH knocked on door and someone in the room asked that I come back later as patient was resting. CH will send follow up request to visit at a later time.

## 2017-05-22 NOTE — Clinical Social Work Note (Signed)
Clinical Social Work Assessment  Patient Details  Name: Brian Dodson MRN: [id number] Date of Birth: [birth year]  Date of referral:  05/22/17               Reason for consult:  Facility Placement                Permission sought to share information with:  Facility Medical sales representativeContact Representative, Family Supports Permission granted to share information::  Yes, Verbal Permission Granted  Name::     Lazaro ArmsFlack,Kay   [phone]  legal guardian or Verda CuminsMoore,Ronnie Other [phone] or Brynda RimJones,Patsy G Sister [phone]   Agency[facility]  [facility]  Relationship::     Contact Information:     Housing/Transportation Living arrangements for the past 2 months:  Assisted Living Facility Source of Information:  Patient Patient Interpreter Needed:  None Criminal Activity/Legal Involvement Pertinent to Current Situation/Hospitalization:  No - Comment as needed Significant Relationships:  Merchandiser, retailCommunity Support, Other Family Members Lives with:  Facility Resident Do you feel safe going back to the place where you live?  Yes Need for family participation in patient care:  No (Coment)  Care giving concerns:  Patient did not express any concerns about returning back to ALF   Social Worker assessment / plan:  Patient is a 61 year old male who is alert and oriented x3 and [facility].  Patient [place] a legal [place].  Patient's guardian is Lazaro ArmsKay Flack [phone].  Patient states he is happy where he is living, and he is pleased with the care that has been provided.  Patient states he was in a SNF for awhile to get some therapy, but [facility] now.  Patient expressed that he does not have any family living anymore, however he has roommate who he gets along with well in his ALF.  Patient expressed he has friends where he lives and he is looking forward to returning back to ALF once he is medically ready for discharge.  Patient states he does  not have any other questions or concerns.  Employment status:  Unemployed, Disabled (Comment on whether or not currently receiving Disability) Insurance information:  Medicare PT Recommendations:  Not assessed at this time Information / Referral to community resources:     Patient/Family's Response to care:  Patient is in agreement to returning back to ALF once he is medically ready for discharge.  Patient/Family's Understanding of and Emotional Response to Diagnosis, Current Treatment, and Prognosis:  Patient is looking forward to returning back home to his ALF, patient states he hopes he does not have to be in hospital much longer.  Emotional Assessment Appearance:  Appears stated age Attitude/Demeanor/Rapport:    Affect (typically observed):  Appropriate, Calm, Pleasant, Stable Orientation:  Oriented to Self, Oriented to Place, Oriented to Situation Alcohol / Substance use:  Not Applicable Psych involvement (Current and /or in the community):  No (Comment)  Discharge Needs  Concerns to be addressed:  No discharge needs identified Readmission within the last 30 days:  No Current discharge risk:  None Barriers to Discharge:  Continued Medical Work up   Darleene Cleavernterhaus, Eric R, LCSWA 05/22/2017, 2:06 PM

## 2017-05-22 NOTE — Progress Notes (Signed)
HR under better control this afternoon.  Still afib, but running around 95-110bpm with addition of metoprolol.  Continue to monitor.

## 2017-05-22 NOTE — Consult Note (Signed)
[facility] Cardiology  CARDIOLOGY CONSULT NOTE  Patient ID: Brian Dodson MRN: [id number] DOB/AGE: 1956-10-15 61 y.o.  Admit date: 05/20/2017 Referring Physician Cherlynn Kaiser Primary Physician Tejan-Sie Primary Cardiologist Reason for Consultation Atrial fibrillation  HPI: 61 year old gentleman referred for evaluation of atrial fibrillation with rapid ventricular rate. The patient has known history of atrial fibrillation, currently on amiodarone and Eliquis. The patient has known schizophrenia, resides at a group home, brought to [facility] emergency room with 2 day history of and confusion. The x-ray revealed left basilar opacity consistent with pneumonia. Patient noted to be tachycardiac, with atrial fibrillation with a rapid ventricular rate. The patient denies chest pain or shortness of breath.  Review of systems complete and found to be negative unless listed above     Past Medical History:  Diagnosis Date  . Asthma   . Atrial fibrillation (HCC)   . Benign prostatic hypertrophy with lower urinary tract symptoms (LUTS)   . Bulbous urethral stricture   . CAD (coronary artery disease)    s/p coronary stent 2003  . Constipation   . COPD (chronic obstructive pulmonary disease) (HCC)   . Diabetes mellitus without complication (HCC)   . Dizziness   . Dyspnea   . GERD (gastroesophageal reflux disease)   . Gross hematuria   . Hyperlipemia   . Hypertension   . Lumbago   . Palpitation   . Schizophrenia [facility])     Past Surgical History:  Procedure Laterality Date  . COLONOSCOPY    . CORONARY ANGIOPLASTY WITH STENT PLACEMENT  11/2003  . kidney stent      Prescriptions Prior to Admission  Medication Sig Dispense Refill Last Dose  . amiodarone (PACERONE) 200 MG tablet Take 100 mg by mouth daily.    05/20/2017 at 0800  . atorvastatin (LIPITOR) 10 MG tablet Take 10 mg by mouth daily.   05/20/2017 at 0800  . donepezil (ARICEPT) 5 MG tablet Take 5 mg by mouth at bedtime.   05/19/2017 at 2000  . ELIQUIS 5 MG  TABS tablet Take 5 mg by mouth 2 (two) times daily.   05/20/2017 at Unknown time  . risperidone (RISPERDAL) 4 MG tablet Take 4 mg by mouth at bedtime.   05/19/2017 at 0800   Social History   Social History  . Marital status: Single    Spouse name: N/A  . Number of children: N/A  . Years of education: N/A   Occupational History  . Not on file.   Social History Main Topics  . Smoking status: Former Games developer  . Smokeless tobacco: Never Used  . Alcohol use No  . Drug use: No  . Sexual activity: Not on file   Other Topics Concern  . Not on file   Social History Narrative  . No narrative on file    Family History  Problem Relation Age of Onset  . Diabetes Unknown   . Bladder Cancer Neg Hx   . Kidney cancer Neg Hx   . Prostate cancer Neg Hx       Review of systems complete and found to be negative unless listed above      PHYSICAL EXAM  General: Well developed, well nourished, in no acute distress HEENT:  Normocephalic and atramatic Neck:  No JVD.  Lungs: Clear bilaterally to auscultation and percussion. Heart: HRRR . Normal S1 and S2 without gallops or murmurs.  Abdomen: Bowel sounds are positive, abdomen soft and non-tender  Msk:  Back normal, normal gait. Normal strength and tone for  age. Extremities: No clubbing, cyanosis or edema.   Neuro: Alert and oriented X 3. Psych:  Good affect, responds appropriately  Labs:   Lab Results  Component Value Date   WBC 8.2 05/22/2017   HGB 10.1 (L) 05/22/2017   HCT 30.1 (L) 05/22/2017   MCV 104.3 (H) 05/22/2017   PLT 101 (L) 05/22/2017    Recent Labs Lab 05/20/17 1027  05/21/17 0548  NA 139  --  136  K 4.6  --  4.1  CL 103  --  108  CO2 27  --  26  BUN 30*  --  25*  CREATININE 1.32*  < > 0.90  CALCIUM 9.5  --  8.0*  PROT 7.3  --   --   BILITOT 1.1  --   --   ALKPHOS 39  --   --   ALT 13*  --   --   AST 24  --   --   GLUCOSE 93  --  79  < > = values in this interval not displayed. Lab Results  Component  Value Date   TROPONINI <0.03 05/23/2015   No results found for: CHOL No results found for: HDL No results found for: LDLCALC No results found for: TRIG No results found for: CHOLHDL No results found for: LDLDIRECT    Radiology: Dg Chest Port 1 View  Result Date: 05/20/2017 CLINICAL DATA:  Increased fever with altered mental status and hypoxemia. Unresponsive. EXAM: PORTABLE CHEST 1 VIEW COMPARISON:  05/23/2015. FINDINGS: 1037 hour. Lordotic positioning and mild patient rotation to the left. The heart size and mediastinal contours are stable. There is poor definition of the left hemidiaphragm with patchy left basilar pulmonary opacity. The right lung appears clear. There is no pleural effusion or pneumothorax. IMPRESSION: New patchy left basilar opacity, partly obscuring the left hemidiaphragm. This could reflect atelectasis or aspiration. Electronically Signed   By: Carey BullocksWilliam  Veazey M.D.   On: 05/20/2017 10:52    EKG: Atrial fibrillation with a rapid ventricular rate  ASSESSMENT AND PLAN:   1. Atrial fibrillation, with rapid ventricular rate, likely compensatory for pneumonia, appears asymptomatic 2. Pneumonia/sepsis, on wide spectrum antibiotics  Recommendations  1. Continue Eliquis for stroke prevention 2. Continue amiodarone 3. Add metoprolol titrate 25 mg twice a day for rate control  Signed: Marcina MillardAlexander Emmanuella Mirante MD,PhD, [facility] 05/22/2017, 10:33 AM

## 2017-05-22 NOTE — Progress Notes (Signed)
CH. stopped by to follow up with Pt. who had a rough day the day before. Pt. was resting.

## 2017-05-22 NOTE — Progress Notes (Signed)
HR up to 160-170 with ambulation or any exertion at all.  Returns back to 110s afib with rest.

## 2017-05-23 LAB — CBC
HCT: 28.2 % — ABNORMAL LOW (ref 40.0–52.0)
Hemoglobin: 9.6 g/dL — ABNORMAL LOW (ref 13.0–18.0)
MCH: 34.9 pg — ABNORMAL HIGH (ref 26.0–34.0)
MCHC: 34 g/dL (ref 32.0–36.0)
MCV: 102.5 fL — ABNORMAL HIGH (ref 80.0–100.0)
Platelets: 130 10*3/uL — ABNORMAL LOW (ref 150–440)
RBC: 2.75 MIL/uL — ABNORMAL LOW (ref 4.40–5.90)
RDW: 15.4 % — ABNORMAL HIGH (ref 11.5–14.5)
WBC: 8 10*3/uL (ref 3.8–10.6)

## 2017-05-23 MED ORDER — LEVOFLOXACIN 750 MG PO TABS: 750.0000 mg | ORAL_TABLET | Freq: Every day | ORAL | 0 refills | Status: DC

## 2017-05-23 MED ORDER — AMIODARONE HCL 200 MG PO TABS: 100.0000 mg | ORAL_TABLET | Freq: Every day | ORAL | Status: DC

## 2017-05-23 MED ORDER — METOPROLOL TARTRATE 25 MG PO TABS: 25.0000 mg | ORAL_TABLET | Freq: Two times a day (BID) | ORAL | 1 refills | Status: DC

## 2017-05-23 NOTE — Discharge Summary (Signed)
[place] [facility] [place] NAME: Brian Dodson    MR#:  [id number]  DATE OF BIRTH:  Apr 10, 1956  DATE OF ADMISSION:  05/20/2017 ADMITTING PHYSICIAN: Demetrios Loll, MD  DATE OF DISCHARGE: 05/23/2017  PRIMARY CARE PHYSICIAN: Jodi Marble, MD    ADMISSION DIAGNOSIS:  Atrial fibrillation with rapid ventricular response ([place]) [I48.91] Sepsis, due to unspecified organism ([place]) [A41.9]  DISCHARGE DIAGNOSIS:  Active Problems:   Sepsis ([place])   SECONDARY DIAGNOSIS:   Past Medical History:  Diagnosis Date  . Asthma   . Atrial fibrillation ([place])   . Benign prostatic hypertrophy with lower urinary tract symptoms (LUTS)   . Bulbous urethral stricture   . CAD (coronary artery disease)    s/p coronary stent 2003  . Constipation   . COPD (chronic obstructive pulmonary disease) ([place])   . Diabetes mellitus without complication ([place])   . Dizziness   . Dyspnea   . GERD (gastroesophageal reflux disease)   . Gross hematuria   . Hyperlipemia   . Hypertension   . Lumbago   . Palpitation   . Schizophrenia [facility])     HOSPITAL COURSE:   61 year old male with past medical history of schizophrenia, diabetes, COPD, coronary artery disease, BPH, hypertension, hyperlipidemia, chronic atrial fibrillation who presented to the hospital due to fever, tachycardia and noted to have chest x-ray findings suggestive pneumonia.  1. Sepsis-patient met criteria admission given the patient's tachycardia, fever and chest x-ray findings suggestive of pneumonia. -Patient was admitted to the hospital treated with broad-spectrum IV antibiotics with vancomycin, cefepime. Patient's MRSA PCR was negative and antibiotics were narrowed down to just IV cefepime. Patient's cultures remained negative and he has been afebrile and hemodynamically stable. -he is being empirically discharged on oral Levaquin for the pneumonia. -Initially treated with IV vancomycin, Cefepime. MRSA PCR is  negative. Cont. IV Cefepime for now.  -Currently afebrile and hemodynamically stable. Cultures (-) so far.   2. Pneumonia-suspected source of patient's sepsis. -Initially treated with IV vancomycin, cefepime. Vancomycin discontinued as MRSA PCR was negative. Maintained on IV cefepime and has improved. Denies any cough, productive sputum or shortness of breath. Not being discharged on oral Levaquin for additional 5 days.  3. Atrial fibrillation with rapid ventricular response-secondary to the pneumonia and sepsis. -Patient has a previous history of atrial fibrillation but his rates were rapid due to his sepsis and pneumonia. A cardiology consult was obtained. Patient's amiodarone dose was increased from 100 mg 200 mg. Oral metoprolol was also added for rate control. -His rates have improved now. He's being discharged on the higher dose of amiodarone and metoprolol. He will continue Eliquis for long-term anticoagulation.  4. Hyperlipidemia - he will cont. Atorvastatin  5. Hx of Schizophrenia/Dementia - he will cont. Risperdal/Aricept.  6. Thrombocytopenia - improved and stable now.  He did not require any transfusions while in the hospital.    DISCHARGE CONDITIONS:   Stable.   CONSULTS OBTAINED:  Treatment Team:  [name], MD  DRUG ALLERGIES:  No Known Allergies  DISCHARGE MEDICATIONS:   Allergies as of 05/23/2017   No Known Allergies     Medication List    TAKE these medications   amiodarone 200 MG tablet Commonly known as:  PACERONE Take 0.5 tablets (100 mg total) by mouth daily.   atorvastatin 10 MG tablet Commonly known as:  LIPITOR Take 10 mg by mouth daily.   donepezil 5 MG tablet Commonly known as:  ARICEPT Take 5 mg  by mouth at bedtime.   ELIQUIS 5 MG Tabs tablet Generic drug:  apixaban Take 5 mg by mouth 2 (two) times daily.   levofloxacin 750 MG tablet Commonly known as:  LEVAQUIN Take 1 tablet (750 mg total) by mouth daily.    metoprolol tartrate 25 MG tablet Commonly known as:  LOPRESSOR Take 1 tablet (25 mg total) by mouth 2 (two) times daily.   risperidone 4 MG tablet Commonly known as:  RISPERDAL Take 4 mg by mouth at bedtime.         DISCHARGE INSTRUCTIONS:   DIET:  Cardiac diet  DISCHARGE CONDITION:  Stable  ACTIVITY:  Activity as tolerated  OXYGEN:  Home Oxygen: No.   Oxygen Delivery: room air  DISCHARGE LOCATION:  group home   If you experience worsening of your admission symptoms, develop shortness of breath, life threatening emergency, suicidal or homicidal thoughts you must seek medical attention immediately by calling 911 or calling your MD immediately  if symptoms less severe.  You Must read complete instructions/literature along with all the possible adverse reactions/side effects for all the Medicines you take and that have been prescribed to you. Take any new Medicines after you have completely understood and accpet all the possible adverse reactions/side effects.   Please note  You were cared for by a hospitalist during your hospital stay. If you have any questions about your discharge medications or the care you received while you were in the hospital after you are discharged, you can call the unit and asked to speak with the hospitalist on call if the hospitalist that took care of you is not available. Once you are discharged, your primary care physician will handle any further medical issues. Please note that NO REFILLS for any discharge medications will be authorized once you are discharged, as it is imperative that you return to your primary care physician (or establish a relationship with a primary care physician if you do not have one) for your aftercare needs so that they can reassess your need for medications and monitor your lab values.     Today   No shortness of breath, cough, Chest pain or palpitations. Feels better. Will d/c back to group home. HR stable.    VITAL SIGNS:  Blood pressure 118/72, pulse 90, temperature 98.7 F (37.1 C), temperature source Oral, resp. rate 16, height 5' 10" (1.778 m), weight 83.6 kg (184 lb 6.4 oz), SpO2 91 %.  I/O:   Intake/Output Summary (Last 24 hours) at 05/23/17 1124 Last data filed at 05/23/17 0900  Gross per 24 hour  Intake              600 ml  Output              300 ml  Net              300 ml    PHYSICAL EXAMINATION:  GENERAL:  61 y.o.-year-old patient lying in the bed with no acute distress.  EYES: Pupils equal, round, reactive to light and accommodation. No scleral icterus. Extraocular muscles intact.  HEENT: Head atraumatic, normocephalic. Oropharynx and nasopharynx clear.  NECK:  Supple, no jugular venous distention. No thyroid enlargement, no tenderness.  LUNGS: Normal breath sounds bilaterally, no wheezing, minimal rales at bases, No rhonchi. No use of accessory muscles of respiration.  CARDIOVASCULAR: S1, S2 normal. No murmurs, rubs, or gallops.  ABDOMEN: Soft, non-tender, non-distended. Bowel sounds present. No organomegaly or mass.  EXTREMITIES: No pedal edema, cyanosis, or  clubbing.  NEUROLOGIC: Cranial nerves II through XII are intact. No focal motor or sensory defecits b/l.  PSYCHIATRIC: The patient is alert and oriented x 3.  SKIN: No obvious rash, lesion, or ulcer.   DATA REVIEW:   CBC  Recent Labs Lab 05/23/17 0509  WBC 8.0  HGB 9.6*  HCT 28.2*  PLT 130*    Chemistries   Recent Labs Lab 05/20/17 1027  05/21/17 0548  NA 139  --  136  K 4.6  --  4.1  CL 103  --  108  CO2 27  --  26  GLUCOSE 93  --  79  BUN 30*  --  25*  CREATININE 1.32*  < > 0.90  CALCIUM 9.5  --  8.0*  AST 24  --   --   ALT 13*  --   --   ALKPHOS 39  --   --   BILITOT 1.1  --   --   < > = values in this interval not displayed.  Cardiac Enzymes No results for input(s): TROPONINI in the last 168 hours.  Microbiology Results  Results for orders placed or performed during the hospital  encounter of 05/20/17  Urine culture     Status: Abnormal   Collection Time: 05/20/17 10:27 AM  Result Value Ref Range Status   Specimen Description URINE, RANDOM  Final   Special Requests NONE  Final   Culture MULTIPLE SPECIES PRESENT, SUGGEST RECOLLECTION (A)  Final   Report Status 05/21/2017 FINAL  Final  Culture, blood (Routine x 2)     Status: None (Preliminary result)   Collection Time: 05/20/17 10:28 AM  Result Value Ref Range Status   Specimen Description BLOOD RIGHT ANTECUBITAL  Final   Special Requests   Final    BOTTLES DRAWN AEROBIC AND ANAEROBIC Blood Culture results may not be optimal due to an inadequate volume of blood received in culture bottles   Culture NO GROWTH 3 DAYS  Final   Report Status PENDING  Incomplete  Culture, blood (Routine x 2)     Status: None (Preliminary result)   Collection Time: 05/20/17 10:28 AM  Result Value Ref Range Status   Specimen Description BLOOD LEFT ANTECUBITAL  Final   Special Requests   Final    BOTTLES DRAWN AEROBIC AND ANAEROBIC Blood Culture results may not be optimal due to an inadequate volume of blood received in culture bottles   Culture NO GROWTH 3 DAYS  Final   Report Status PENDING  Incomplete  MRSA PCR Screening     Status: None   Collection Time: 05/20/17  4:20 PM  Result Value Ref Range Status   MRSA by PCR NEGATIVE NEGATIVE Final    Comment:        The GeneXpert MRSA Assay (FDA approved for NASAL specimens only), is one component of a comprehensive MRSA colonization surveillance program. It is not intended to diagnose MRSA infection nor to guide or monitor treatment for MRSA infections.     RADIOLOGY:  No results found.    Management plans discussed with the patient, family and they are in agreement.  CODE STATUS:     Code Status Orders        Start     Ordered   05/20/17 1342  Full code  Continuous     05/20/17 1341    Code Status History    Date Active Date Inactive Code Status Order ID  Comments User Context   05/23/2015  9:26  PM 05/24/2015  5:23 PM Full Code [id number]  Lance Coon, MD Inpatient      TOTAL TIME TAKING CARE OF THIS PATIENT: 40 minutes.    Henreitta Leber M.D on 05/23/2017 at 11:24 AM  Between 7am to 6pm - Pager - [phone]  After 6pm go to [email or web address] - Proofreader  [facility]  Office  [phone]  CC: Primary care physician; Jodi Marble, MD

## 2017-05-23 NOTE — Care Management Important Message (Signed)
Important Message  Patient Details  Name: Brian Dodson MRN: [id number] Date of Birth: 12/17/1955   Medicare Important Message Given:  Yes    Eber HongGreene, Nann R, RN 05/23/2017, 11:51 AM

## 2017-05-23 NOTE — Clinical Social Work Note (Addendum)
CSW contacted patient's legal guardian Brian Dodson at [phone], and also supervisor at group home Brian Dodson [phone] and informed that patient is ready for discharge today.  Patient will be picked up around 2pm for transport back to his ALF by care provider.    [facility] to [facility].  Patient and family agreeable to plans will transport via facility transportation.  Windell MouldingEric Anterhaus, MSW, Theresia MajorsLCSWA [phone]

## 2017-05-23 NOTE — [place] FL2 (Signed)
[place] MEDICAID FL2 LEVEL OF CARE SCREENING TOOL     IDENTIFICATION  Patient Name: CECILIO Dodson Birthdate: 11-29-1955 Sex: male Admission Date (Current Location): 05/20/2017  [place] and [place] Number:  Chiropodist and Address:  [facility], [address] [place], [place] [address]      Provider Number: [id number]  Attending Physician Name and Address:  Houston Siren, MD  Relative Name and Phone Number:  Lazaro Arms   [phone] legal guardian, Verda Cumins [phone] or Acel, Natzke Sister [phone]     Current Level of Care: Hospital Recommended Level of Care: Assisted Living Facility Prior Approval Number:    Date Approved/Denied:   PASRR Number:    Discharge Plan: Other (Comment) [facility])    Current Diagnoses: Patient Active Problem List   Diagnosis Date Noted  . Sepsis (HCC) 05/20/2017  . Atrial fibrillation with RVR (HCC) 05/23/2015  . Syncope 05/23/2015  . Schizophrenia (HCC) 05/23/2015  . Diabetes mellitus, type II (HCC) 05/23/2015  . HTN (hypertension) 05/23/2015  . GERD (gastroesophageal reflux disease) 05/23/2015  . CAD (coronary artery disease) 05/23/2015    Orientation RESPIRATION BLADDER Height & Weight     Self, Situation, Place  Normal Continent Weight: 184 lb 6.4 oz (83.6 kg) Height:  5\' 10"  (177.8 cm)  BEHAVIORAL SYMPTOMS/MOOD NEUROLOGICAL BOWEL NUTRITION STATUS      Continent Diet (Cardiac diet)  AMBULATORY STATUS COMMUNICATION OF NEEDS Skin   Independent Verbally Normal                       Personal Care Assistance Level of Assistance  Feeding, Bathing, Dressing Bathing Assistance: Limited assistance Feeding assistance: Independent Dressing Assistance: Limited assistance     Functional Limitations Info  Sight, Hearing, Speech Sight Info: Adequate Hearing Info: Adequate Speech Info: Adequate    SPECIAL CARE FACTORS FREQUENCY                        Contractures Contractures Info: Not present    Additional Factors Info  Code Status, Allergies, Psychotropic Code Status Info: Full Code Allergies Info: NKA Psychotropic Info: risperiDONE (RISPERDAL) tablet 4 mg         Current Medications (05/23/2017):  This is the current hospital active medication list Current Facility-Administered Medications  Medication Dose Route Frequency Provider Last Rate Last Dose  . 0.9 %  sodium chloride infusion   Intravenous Continuous Shaune Pollack, MD 100 mL/hr at 05/22/17 0530    . acetaminophen (TYLENOL) tablet 650 mg  650 mg Oral Q6H PRN Shaune Pollack, MD       Or  . acetaminophen (TYLENOL) suppository 650 mg  650 mg Rectal Q6H PRN Shaune Pollack, MD      . albuterol (PROVENTIL) (2.5 MG/3ML) 0.083% nebulizer solution 2.5 mg  2.5 mg Nebulization Q2H PRN Shaune Pollack, MD      . amiodarone (PACERONE) tablet 200 mg  200 mg Oral Daily Houston Siren, MD   200 mg at 05/23/17 0947  . apixaban (ELIQUIS) tablet 5 mg  5 mg Oral BID Shaune Pollack, MD   5 mg at 05/23/17 0947  . atorvastatin (LIPITOR) tablet 10 mg  10 mg Oral Daily Shaune Pollack, MD   10 mg at 05/23/17 0947  . bisacodyl (DULCOLAX) EC tablet 5 mg  5 mg Oral Daily PRN Shaune Pollack, MD      . ceFEPIme (MAXIPIME) 2 g in dextrose 5 % 50 mL  IVPB  2 g Intravenous Q12H Demetrius CharityJames, Teldrin D, RPH 100 mL/hr at 05/23/17 0946 2 g at 05/23/17 0946  . donepezil (ARICEPT) tablet 5 mg  5 mg Oral QHS Shaune Pollackhen, Qing, MD   5 mg at 05/22/17 2101  . HYDROcodone-acetaminophen (NORCO/VICODIN) 5-325 MG per tablet 1-2 tablet  1-2 tablet Oral Q4H PRN Shaune Pollackhen, Qing, MD      . ketorolac (TORADOL) 30 MG/ML injection 30 mg  30 mg Intravenous Q6H PRN Shaune Pollackhen, Qing, MD      . metoprolol tartrate (LOPRESSOR) injection 5 mg  5 mg Intravenous Q4H PRN Shaune Pollackhen, Qing, MD      . metoprolol tartrate (LOPRESSOR) tablet 25 mg  25 mg Oral BID Marcina MillardParaschos, Alexander, MD   25 mg at 05/23/17 0947  . ondansetron (ZOFRAN) tablet 4 mg  4 mg Oral Q6H PRN Shaune Pollackhen, Qing, MD       Or   . ondansetron [place](ZOFRAN) injection 4 mg  4 mg Intravenous Q6H PRN Shaune Pollackhen, Qing, MD      . risperiDONE (RISPERDAL) tablet 4 mg  4 mg Oral QHS Shaune Pollackhen, Qing, MD   4 mg at 05/22/17 2101  . senna-docusate (Senokot-S) tablet 1 tablet  1 tablet Oral QHS PRN Shaune Pollackhen, Qing, MD      . sodium chloride flush (NS) 0.9 % injection 3 mL  3 mL Intravenous Q12H Houston SirenSainani, Vivek J, MD   3 mL at 05/22/17 2101     Discharge Medications: Please see discharge summary for a list of discharge medications.  Medication List    TAKE these medications   amiodarone 200 MG tablet Commonly known as:  PACERONE Take 0.5 tablets (100 mg total) by mouth daily.   atorvastatin 10 MG tablet Commonly known as:  LIPITOR Take 10 mg by mouth daily.   donepezil 5 MG tablet Commonly known as:  ARICEPT Take 5 mg by mouth at bedtime.   ELIQUIS 5 MG Tabs tablet Generic drug:  apixaban Take 5 mg by mouth 2 (two) times daily.   levofloxacin 750 MG tablet Commonly known as:  LEVAQUIN Take 1 tablet (750 mg total) by mouth daily.   metoprolol tartrate 25 MG tablet Commonly known as:  LOPRESSOR Take 1 tablet (25 mg total) by mouth 2 (two) times daily.   risperidone 4 MG tablet Commonly known as:  RISPERDAL Take 4 mg by mouth at bedtime.      Relevant Imaging Results:  Relevant Lab Results:   Additional Information SSN [id number]  Darleene Cleavernterhaus, Eric R, [place]WA

## 2017-05-23 NOTE — Progress Notes (Signed)
[facility]  SUBJECTIVE: History difficult to obtain due to decreased mental acuity. However, patient denies chest pain. Admits to some shortness of breath. He has ambulated this morning without difficulty.   Vitals:   05/22/17 1219 05/22/17 1235 05/22/17 2040 05/23/17 0546  BP:  109/77 115/72 (!) 151/119  Pulse: 88 89 95 76  Resp: 18  20 20   Temp: 98 F (36.7 C)  99.1 F (37.3 C) 98.2 F (36.8 C)  TempSrc:   Oral Oral  SpO2: 98%  99% 95%  Weight:      Height:         Intake/Output Summary (Last 24 hours) at 05/23/17 0802 Last data filed at 05/23/17 [id number]  Gross per 24 hour  Intake              480 ml  Output              300 ml  Net              180 ml      PHYSICAL EXAM  General: Well developed, well nourished, in no acute distress HEENT:  Normocephalic and atramatic Neck:  No JVD.  Lungs: Normal effort of breathing.  Heart: Irregularly irregular Abdomen: Bowel sounds are positive, abdomen soft Msk:  Back normal, sitting upright in bed. Muscle tone appears normal for age. Extremities: No clubbing, cyanosis or edema.   Neuro: Alert and oriented Psych: Slow to respond   LABS: Basic Metabolic Panel:  Recent Labs  69/62/9506/01/06 1027 05/20/17 1443 05/21/17 0548  NA 139  --  136  K 4.6  --  4.1  CL 103  --  108  CO2 27  --  26  GLUCOSE 93  --  79  BUN 30*  --  25*  CREATININE 1.32* 0.98 0.90  CALCIUM 9.5  --  8.0*   Liver Function Tests:  Recent Labs  05/20/17 1027  AST 24  ALT 13*  ALKPHOS 39  BILITOT 1.1  PROT 7.3  ALBUMIN 3.4*   No results for input(s): LIPASE, AMYLASE in the last 72 hours. CBC:  Recent Labs  05/20/17 1027  05/22/17 0434 05/23/17 0509  WBC 8.9  < > 8.2 8.0  NEUTROABS 5.9  --   --   --   HGB 11.8*  < > 10.1* 9.6*  HCT 34.7*  < > 30.1* 28.2*  MCV 104.6*  < > 104.3* 102.5*  PLT 103*  < > 101* 130*  < > = values in this interval not displayed. Cardiac Enzymes: No results for input(s): CKTOTAL, CKMB, CKMBINDEX, TROPONINI  in the last 72 hours. BNP: Invalid input(s): POCBNP D-Dimer: No results for input(s): DDIMER in the last 72 hours. Hemoglobin A1C: No results for input(s): HGBA1C in the last 72 hours. Fasting Lipid Panel: No results for input(s): CHOL, HDL, LDLCALC, TRIG, CHOLHDL, LDLDIRECT in the last 72 hours. Thyroid Function Tests: No results for input(s): TSH, T4TOTAL, T3FREE, THYROIDAB in the last 72 hours.  Invalid input(s): FREET3 Anemia Panel: No results for input(s): VITAMINB12, FOLATE, FERRITIN, TIBC, IRON, RETICCTPCT in the last 72 hours.  No results found.   TELEMETRY: Atrial fibrillation, rate 87-108 bpm  ASSESSMENT AND PLAN:  Active Problems:   Sepsis (HCC)    1. Atrial fibrillation with RVR, likely compensatory for pneumonia, appears asymptomatic, rate under better control with the addition of metoprolol. 2. Sepsis due to pneumonia, on antibiotics, clinically improving.  Recommendations:  1. Continue Eliquis for stroke prevention. 2. Continue metoprolol  and amiodarone 3. Recommend follow-up with Dr. Juliann Pares as outpatient in 1 week.   Leanora Ivanoff, PA-C 05/23/2017 8:02 AM

## 2017-05-23 NOTE — Progress Notes (Signed)
Pt to be discharged back to [facility] today. Iv and tele removed. Packet taken by caregiver. Transport by care giver.

## 2017-05-25 LAB — CULTURE, BLOOD (ROUTINE X 2)
Culture: NO GROWTH
Culture: NO GROWTH

## 2017-06-11 ENCOUNTER — Inpatient Hospital Stay: Payer: Medicare Other | Attending: Oncology | Admitting: Oncology

## 2017-06-11 ENCOUNTER — Telehealth: Payer: Self-pay | Admitting: Oncology

## 2017-06-11 ENCOUNTER — Encounter: Payer: Self-pay | Admitting: Oncology

## 2017-06-11 VITALS — BP 107/56 | HR 48 | Temp 98.6°F | Ht 72.0 in | Wt 198.6 lb

## 2017-06-11 DIAGNOSIS — Z8701 Personal history of pneumonia (recurrent): Secondary | ICD-10-CM | POA: Diagnosis not present

## 2017-06-11 DIAGNOSIS — E785 Hyperlipidemia, unspecified: Secondary | ICD-10-CM | POA: Diagnosis not present

## 2017-06-11 DIAGNOSIS — D72819 Decreased white blood cell count, unspecified: Secondary | ICD-10-CM | POA: Diagnosis not present

## 2017-06-11 DIAGNOSIS — R5383 Other fatigue: Secondary | ICD-10-CM | POA: Insufficient documentation

## 2017-06-11 DIAGNOSIS — D696 Thrombocytopenia, unspecified: Secondary | ICD-10-CM | POA: Diagnosis not present

## 2017-06-11 DIAGNOSIS — R600 Localized edema: Secondary | ICD-10-CM | POA: Insufficient documentation

## 2017-06-11 DIAGNOSIS — J449 Chronic obstructive pulmonary disease, unspecified: Secondary | ICD-10-CM | POA: Insufficient documentation

## 2017-06-11 DIAGNOSIS — Z95818 Presence of other cardiac implants and grafts: Secondary | ICD-10-CM | POA: Diagnosis not present

## 2017-06-11 DIAGNOSIS — D539 Nutritional anemia, unspecified: Secondary | ICD-10-CM | POA: Diagnosis not present

## 2017-06-11 DIAGNOSIS — Z79899 Other long term (current) drug therapy: Secondary | ICD-10-CM | POA: Insufficient documentation

## 2017-06-11 DIAGNOSIS — I129 Hypertensive chronic kidney disease with stage 1 through stage 4 chronic kidney disease, or unspecified chronic kidney disease: Secondary | ICD-10-CM

## 2017-06-11 DIAGNOSIS — N400 Enlarged prostate without lower urinary tract symptoms: Secondary | ICD-10-CM | POA: Diagnosis not present

## 2017-06-11 DIAGNOSIS — N189 Chronic kidney disease, unspecified: Secondary | ICD-10-CM

## 2017-06-11 DIAGNOSIS — K219 Gastro-esophageal reflux disease without esophagitis: Secondary | ICD-10-CM | POA: Diagnosis not present

## 2017-06-11 DIAGNOSIS — Z7982 Long term (current) use of aspirin: Secondary | ICD-10-CM

## 2017-06-11 DIAGNOSIS — I2510 Atherosclerotic heart disease of native coronary artery without angina pectoris: Secondary | ICD-10-CM | POA: Insufficient documentation

## 2017-06-11 DIAGNOSIS — F209 Schizophrenia, unspecified: Secondary | ICD-10-CM | POA: Insufficient documentation

## 2017-06-11 DIAGNOSIS — R5381 Other malaise: Secondary | ICD-10-CM

## 2017-06-11 DIAGNOSIS — Z7901 Long term (current) use of anticoagulants: Secondary | ICD-10-CM | POA: Diagnosis not present

## 2017-06-11 DIAGNOSIS — Z87891 Personal history of nicotine dependence: Secondary | ICD-10-CM

## 2017-06-11 DIAGNOSIS — E1122 Type 2 diabetes mellitus with diabetic chronic kidney disease: Secondary | ICD-10-CM | POA: Diagnosis not present

## 2017-06-11 NOTE — Telephone Encounter (Signed)
MD on 06/21/17 @ 11:45 a.m./15 mins, per Cordelia PenSherry. MF

## 2017-06-11 NOTE — Progress Notes (Signed)
Patient here for initial visit. Referred for abnormal lab values.

## 2017-06-12 ENCOUNTER — Inpatient Hospital Stay: Payer: Medicare Other

## 2017-06-12 DIAGNOSIS — D539 Nutritional anemia, unspecified: Secondary | ICD-10-CM

## 2017-06-12 LAB — CBC WITH DIFFERENTIAL/PLATELET
Basophils Absolute: 0 10*3/uL (ref 0–0.1)
Basophils Relative: 0 %
Eosinophils Absolute: 0.1 10*3/uL (ref 0–0.7)
Eosinophils Relative: 4 %
HCT: 29.6 % — ABNORMAL LOW (ref 40.0–52.0)
Hemoglobin: 10 g/dL — ABNORMAL LOW (ref 13.0–18.0)
Lymphocytes Relative: 59 %
Lymphs Abs: 1.9 10*3/uL (ref 1.0–3.6)
MCH: 35.5 pg — ABNORMAL HIGH (ref 26.0–34.0)
MCHC: 33.7 g/dL (ref 32.0–36.0)
MCV: 105.3 fL — ABNORMAL HIGH (ref 80.0–100.0)
Monocytes Absolute: 0.2 10*3/uL (ref 0.2–1.0)
Monocytes Relative: 8 %
Neutro Abs: 0.9 10*3/uL — ABNORMAL LOW (ref 1.4–6.5)
Neutrophils Relative %: 29 %
Platelets: 87 10*3/uL — ABNORMAL LOW (ref 150–440)
RBC: 2.82 MIL/uL — ABNORMAL LOW (ref 4.40–5.90)
RDW: 14.9 % — ABNORMAL HIGH (ref 11.5–14.5)
WBC: 3.1 10*3/uL — ABNORMAL LOW (ref 3.8–10.6)

## 2017-06-12 LAB — COMPREHENSIVE METABOLIC PANEL
ALT: 10 U/L — ABNORMAL LOW (ref 17–63)
AST: 20 U/L (ref 15–41)
Albumin: 2.8 g/dL — ABNORMAL LOW (ref 3.5–5.0)
Alkaline Phosphatase: 41 U/L (ref 38–126)
Anion gap: 3 — ABNORMAL LOW (ref 5–15)
BUN: 18 mg/dL (ref 6–20)
CO2: 29 mmol/L (ref 22–32)
Calcium: 8.5 mg/dL — ABNORMAL LOW (ref 8.9–10.3)
Chloride: 106 mmol/L (ref 101–111)
Creatinine, Ser: 1.09 mg/dL (ref 0.61–1.24)
GFR calc Af Amer: >60 mL/min (ref >60)
GFR calc non Af Amer: >60 mL/min (ref >60)
Glucose, Bld: 75 mg/dL (ref 65–99)
Potassium: 4.6 mmol/L (ref 3.5–5.1)
Sodium: 138 mmol/L (ref 135–145)
Total Bilirubin: 0.5 mg/dL (ref 0.3–1.2)
Total Protein: 5.7 g/dL — ABNORMAL LOW (ref 6.5–8.1)

## 2017-06-12 LAB — LACTATE DEHYDROGENASE: LDH: 154 U/L (ref 98–192)

## 2017-06-12 LAB — FERRITIN: Ferritin: 97 ng/mL (ref 24–336)

## 2017-06-12 LAB — PATHOLOGIST SMEAR REVIEW

## 2017-06-12 LAB — IRON AND TIBC
Iron: 88 ug/dL (ref 45–182)
Saturation Ratios: 32 % (ref 17.9–39.5)
TIBC: 272 ug/dL (ref 250–450)
UIBC: 184 ug/dL

## 2017-06-12 LAB — RETICULOCYTES
RBC.: 2.93 MIL/uL — ABNORMAL LOW (ref 4.40–5.90)
Retic Count, Absolute: 44 10*3/uL (ref 19.0–183.0)
Retic Ct Pct: 1.5 % (ref 0.4–3.1)

## 2017-06-13 ENCOUNTER — Ambulatory Visit: Payer: Medicare Other | Admitting: Oncology

## 2017-06-13 LAB — MULTIPLE MYELOMA PANEL, SERUM
Albumin SerPl Elph-Mcnc: 2.9 g/dL (ref 2.9–4.4)
Albumin/Glob SerPl: 1.1 (ref 0.7–1.7)
Alpha 1: 0.2 g/dL (ref 0.0–0.4)
Alpha2 Glob SerPl Elph-Mcnc: 0.4 g/dL (ref 0.4–1.0)
B-Globulin SerPl Elph-Mcnc: 0.8 g/dL (ref 0.7–1.3)
Gamma Glob SerPl Elph-Mcnc: 1.3 g/dL (ref 0.4–1.8)
Globulin, Total: 2.7 g/dL (ref 2.2–3.9)
IgA: 235 mg/dL (ref 61–437)
IgG (Immunoglobin G), Serum: 1255 mg/dL (ref 700–1600)
IgM, Serum: 65 mg/dL (ref 20–172)
Total Protein ELP: 5.6 g/dL — ABNORMAL LOW (ref 6.0–8.5)

## 2017-06-13 LAB — HAPTOGLOBIN: Haptoglobin: 23 mg/dL — ABNORMAL LOW (ref 34–200)

## 2017-06-17 ENCOUNTER — Encounter: Payer: Self-pay | Admitting: Oncology

## 2017-06-17 NOTE — Progress Notes (Signed)
Hematology/Oncology Consult note [facility] [phone] [phone]  Patient Care Team: [name], MD as PCP - General (Family Medicine) [name], MD as Consulting Physician (Cardiology)   Name of the patient: Brian Dodson  [id number]  02-08-1956    Reason for referral- anemia   Referring physician- Dr. Kary Kos  Date of visit: 06/17/17   History of presenting illness- Patient is a 61 year old African-American male with a prior history of schizophrenia who currently resides in a group home. His past medical history is significant for asthma, COPD, atrial fibrillation, coronary artery disease and diabetes, chronic kidney disease  among other medical problems. He was recently discharged from the hospital in July 2018 following an episode of sepsis and pneumonia along with uncontrolled A. fib. He is currently on amiodarone and eliquis for the same. He has been referred to [place] for anemia. Recent CBC from 05/21/2017 showed white count of 7.9, H&H of 10.2/29.9 with an MCV of 103.7 and a platelet count of 93. HIV testing was negative. BMP was within normal limits. In May and June 2018 patient was noted to have a white count between 3.4 x 3.6. His hemoglobin again at that time was between 9-10 and was macrocytic. Platelet count in June 2018 was 92. Iron studies and folate was within normal limits in May 2018. B12 level was normal at 590. CMP from May 2018 was normal. TSH was normal at 1.9 in May 2018.   Patient currently reports fatigue and bilateral lower extremity swelling. Overall he is a poor historian and unable to elaborate much on history. Patient was noted to be bradycardic on his recent visit to his PCP on 05/27/2017 and his metoprolol was held. He did undergo nuclear stress test which showed borderline abnormal myocardial perfusion with left ventricular enlargement and inferior defect with apical borderline ischemia and ejection  fraction of 53%. Medical therapy was recommended unless symptoms persist or worsen and consider cardiac cath at that point   ECOG PS- 2  Pain scale- 4   Review of systems- Review of Systems  Constitutional: Positive for malaise/fatigue. Negative for chills, fever and weight loss.  HENT: Negative for congestion, ear discharge and nosebleeds.   Eyes: Negative for blurred vision.  Respiratory: Negative for cough, hemoptysis, sputum production, shortness of breath and wheezing.   Cardiovascular: Positive for leg swelling. Negative for chest pain, palpitations, orthopnea and claudication.  Gastrointestinal: Negative for abdominal pain, blood in stool, constipation, diarrhea, heartburn, melena, nausea and vomiting.  Genitourinary: Negative for dysuria, flank pain, frequency, hematuria and urgency.  Musculoskeletal: Negative for back pain, joint pain and myalgias.  Skin: Negative for rash.  Neurological: Negative for dizziness, tingling, focal weakness, seizures, weakness and headaches.  Endo/Heme/Allergies: Does not bruise/bleed easily.  Psychiatric/Behavioral: Negative for depression and suicidal ideas. The patient does not have insomnia.     No Known Allergies  Patient Active Problem List   Diagnosis Date Noted  . Sepsis ([place]) 05/20/2017  . Atrial fibrillation with RVR ([place]) 05/23/2015  . Syncope 05/23/2015  . Schizophrenia ([place]) 05/23/2015  . Diabetes mellitus, type II ([place]) 05/23/2015  . HTN (hypertension) 05/23/2015  . GERD (gastroesophageal reflux disease) 05/23/2015  . CAD (coronary artery disease) 05/23/2015     Past Medical History:  Diagnosis Date  . Asthma   . Atrial fibrillation ([place])   . Benign prostatic hypertrophy with lower urinary tract symptoms (LUTS)   . Bulbous urethral stricture   . CAD (coronary artery disease)    s/p  coronary stent 2003  . Constipation   . COPD (chronic obstructive pulmonary disease) ([place])   . Diabetes mellitus without complication  ([place])   . Dizziness   . Dyspnea   . GERD (gastroesophageal reflux disease)   . Gross hematuria   . Hyperlipemia   . Hypertension   . Lumbago   . Palpitation   . Schizophrenia [facility])      Past Surgical History:  Procedure Laterality Date  . COLONOSCOPY    . CORONARY ANGIOPLASTY WITH STENT PLACEMENT  11/2003  . kidney stent      Social History   Social History  . Marital status: Single    Spouse name: N/A  . Number of children: N/A  . Years of education: N/A   Occupational History  . Not on file.   Social History Main Topics  . Smoking status: Former Research scientist (life sciences)  . Smokeless tobacco: Never Used  . Alcohol use No  . Drug use: No  . Sexual activity: Not on file   Other Topics Concern  . Not on file   Social History Narrative  . No narrative on file     Family History  Problem Relation Age of Onset  . Diabetes Unknown   . Bladder Cancer Neg Hx   . Kidney cancer Neg Hx   . Prostate cancer Neg Hx      Current Outpatient Prescriptions:  .  acetaminophen (TYLENOL) 500 MG tablet, Take 500 mg by mouth every 6 (six) hours as needed., Disp: , Rfl:  .  amiodarone (PACERONE) 200 MG tablet, Take 0.5 tablets (100 mg total) by mouth daily., Disp: , Rfl:  .  aspirin 81 MG chewable tablet, Chew by mouth daily., Disp: , Rfl:  .  atorvastatin (LIPITOR) 10 MG tablet, Take 10 mg by mouth daily., Disp: , Rfl:  .  citalopram (CELEXA) 20 MG tablet, Take 20 mg by mouth daily., Disp: , Rfl:  .  divalproex (DEPAKOTE ER) 500 MG 24 hr tablet, Take by mouth daily., Disp: , Rfl:  .  donepezil (ARICEPT) 5 MG tablet, Take 5 mg by mouth at bedtime., Disp: , Rfl:  .  ELIQUIS 5 MG TABS tablet, Take 5 mg by mouth 2 (two) times daily., Disp: , Rfl:  .  ranitidine (ZANTAC) 150 MG tablet, Take 150 mg by mouth 2 (two) times daily., Disp: , Rfl:  .  benztropine (COGENTIN) 1 MG tablet, Take by mouth., Disp: , Rfl:  .  risperidone (RISPERDAL) 4 MG tablet, Take 4 mg by mouth at bedtime., Disp: , Rfl:     Physical exam:  Vitals:   06/11/17 1434  BP: (!) 107/56  Pulse: (!) 48  Temp: 98.6 F (37 C)  TempSrc: Tympanic  Weight: 198 lb 9.6 oz (90.1 kg)  Height: 6' (1.829 m)   Physical Exam  Constitutional: He is oriented to person, place, and time and well-developed, well-nourished, and in no distress.  HENT:  Head: Normocephalic and atraumatic.  edentulous  Eyes: Pupils are equal, round, and reactive to light. EOM are normal.  Neck: Normal range of motion.  Cardiovascular: Regular rhythm and normal heart sounds.   bradycardic  Pulmonary/Chest: Effort normal and breath sounds normal.  Abdominal: Soft. Bowel sounds are normal.  No palpable splenomegaly  Musculoskeletal: He exhibits edema (b/l +2).  Neurological: He is alert and oriented to person, place, and time.  Skin: Skin is warm and dry.       CMP Latest Ref Rng & Units 06/12/2017  Glucose  65 - 99 mg/dL 75  BUN 6 - 20 mg/dL 18  Creatinine 0.61 - 1.24 mg/dL 1.09  Sodium 135 - 145 mmol/L 138  Potassium 3.5 - 5.1 mmol/L 4.6  Chloride 101 - 111 mmol/L 106  CO2 22 - 32 mmol/L 29  Calcium 8.9 - 10.3 mg/dL 8.5(L)  Total Protein 6.5 - 8.1 g/dL 5.7(L)  Total Bilirubin 0.3 - 1.2 mg/dL 0.5  Alkaline Phos 38 - 126 U/L 41  AST 15 - 41 U/L 20  ALT 17 - 63 U/L 10(L)   CBC Latest Ref Rng & Units 06/12/2017  WBC 3.8 - 10.6 K/uL 3.1(L)  Hemoglobin 13.0 - 18.0 g/dL 10.0(L)  Hematocrit 40.0 - 52.0 % 29.6(L)  Platelets 150 - 440 K/uL 87(L)    No images are attached to the encounter.  Dg Chest Port 1 View  Result Date: 05/20/2017 CLINICAL DATA:  Increased fever with altered mental status and hypoxemia. Unresponsive. EXAM: PORTABLE CHEST 1 VIEW COMPARISON:  05/23/2015. FINDINGS: 1037 hour. Lordotic positioning and mild patient rotation to the left. The heart size and mediastinal contours are stable. There is poor definition of the left hemidiaphragm with patchy left basilar pulmonary opacity. The right lung appears clear. There is  no pleural effusion or pneumothorax. IMPRESSION: New patchy left basilar opacity, partly obscuring the left hemidiaphragm. This could reflect atelectasis or aspiration. Electronically Signed   By: Richardean Sale M.D.   On: 05/20/2017 10:52    Assessment and plan- Patient is a 61 y.o. male referred for macrocytic anemia  Macrocytic anemia- no known chronic liver disease. Patient has moderate macrocytic anemia along with thrombocytopenia. Hi hb has been between 9-10 over last 3 years. MMCV was between 98-99 in 2015 and now gradually increasing. Among his medications- amiodarone, donezepil, depakote and risperidone can cause anemia and/or thrombocytopenia but none of them known to cause macrocytosis. I will start with peripheral blood work up including  CBC, CMP, pathology review of smear, reticulocytes, iron studies, ferritin, multiple myeloma panel, LDH and haptoglobin. I will see the patient back in 2 weeks' time to discuss the results of his blood work. Macrocytic anemia along with intermittent mild leukopenia and thrombocytopenia is concerning for underlying MDS and if the results of peripheral blood work are unremarkable, bone marrow biopsy may be warranted in his case.    Thank you for this kind referral and the opportunity to participate in the care of this patient   Visit Diagnosis 1. Macrocytic anemia     Dr. Randa Evens, MD, MPH [place] [facility] Pager- [phone] 06/17/2017

## 2017-06-21 ENCOUNTER — Inpatient Hospital Stay: Payer: Medicare Other | Attending: Oncology | Admitting: Oncology

## 2017-06-21 VITALS — BP 117/70 | HR 56 | Temp 95.0°F | Resp 18 | Wt 190.0 lb

## 2017-06-21 DIAGNOSIS — G8929 Other chronic pain: Secondary | ICD-10-CM | POA: Insufficient documentation

## 2017-06-21 DIAGNOSIS — E1122 Type 2 diabetes mellitus with diabetic chronic kidney disease: Secondary | ICD-10-CM | POA: Diagnosis not present

## 2017-06-21 DIAGNOSIS — Z79899 Other long term (current) drug therapy: Secondary | ICD-10-CM | POA: Diagnosis not present

## 2017-06-21 DIAGNOSIS — R5383 Other fatigue: Secondary | ICD-10-CM

## 2017-06-21 DIAGNOSIS — M549 Dorsalgia, unspecified: Secondary | ICD-10-CM | POA: Insufficient documentation

## 2017-06-21 DIAGNOSIS — Z8701 Personal history of pneumonia (recurrent): Secondary | ICD-10-CM | POA: Diagnosis not present

## 2017-06-21 DIAGNOSIS — Z7982 Long term (current) use of aspirin: Secondary | ICD-10-CM | POA: Diagnosis not present

## 2017-06-21 DIAGNOSIS — I2510 Atherosclerotic heart disease of native coronary artery without angina pectoris: Secondary | ICD-10-CM | POA: Diagnosis not present

## 2017-06-21 DIAGNOSIS — J449 Chronic obstructive pulmonary disease, unspecified: Secondary | ICD-10-CM | POA: Insufficient documentation

## 2017-06-21 DIAGNOSIS — D61818 Other pancytopenia: Secondary | ICD-10-CM | POA: Diagnosis present

## 2017-06-21 DIAGNOSIS — Z955 Presence of coronary angioplasty implant and graft: Secondary | ICD-10-CM | POA: Diagnosis not present

## 2017-06-21 DIAGNOSIS — D539 Nutritional anemia, unspecified: Secondary | ICD-10-CM

## 2017-06-21 DIAGNOSIS — N189 Chronic kidney disease, unspecified: Secondary | ICD-10-CM | POA: Diagnosis not present

## 2017-06-21 DIAGNOSIS — M79606 Pain in leg, unspecified: Secondary | ICD-10-CM | POA: Diagnosis not present

## 2017-06-21 DIAGNOSIS — Z7984 Long term (current) use of oral hypoglycemic drugs: Secondary | ICD-10-CM | POA: Diagnosis not present

## 2017-06-21 DIAGNOSIS — R5381 Other malaise: Secondary | ICD-10-CM | POA: Diagnosis not present

## 2017-06-21 DIAGNOSIS — R600 Localized edema: Secondary | ICD-10-CM | POA: Insufficient documentation

## 2017-06-21 DIAGNOSIS — Z87891 Personal history of nicotine dependence: Secondary | ICD-10-CM | POA: Insufficient documentation

## 2017-06-21 DIAGNOSIS — E785 Hyperlipidemia, unspecified: Secondary | ICD-10-CM | POA: Diagnosis not present

## 2017-06-21 DIAGNOSIS — I129 Hypertensive chronic kidney disease with stage 1 through stage 4 chronic kidney disease, or unspecified chronic kidney disease: Secondary | ICD-10-CM | POA: Diagnosis not present

## 2017-06-21 DIAGNOSIS — K219 Gastro-esophageal reflux disease without esophagitis: Secondary | ICD-10-CM | POA: Diagnosis not present

## 2017-06-21 DIAGNOSIS — Z7901 Long term (current) use of anticoagulants: Secondary | ICD-10-CM | POA: Diagnosis not present

## 2017-06-21 DIAGNOSIS — I4891 Unspecified atrial fibrillation: Secondary | ICD-10-CM | POA: Diagnosis not present

## 2017-06-21 DIAGNOSIS — F209 Schizophrenia, unspecified: Secondary | ICD-10-CM

## 2017-06-21 NOTE — Progress Notes (Signed)
Patient states he had a fall this past week while in the shower and hit his head.  States later he had problems seeing. Offers no complaints today.  Has been placed on metformin since last visit.

## 2017-06-21 NOTE — Progress Notes (Signed)
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 His past medical history is significant for asthma, COPD, atrial fibrillation, coronary artery disease and diabetes, chronic kidney disease  among other medical problems. He was recently discharged from the hospital in July 2018 following an episode of sepsis and pneumonia along with uncontrolled A. fib. He is currently on amiodarone and eliquis for the same. He has been referred to [place] for anemia. Recent CBC from 05/21/2017 showed white count of 7.9, H&H of 10.2/29.9 with an MCV of 103.7 and a platelet count of 93. HIV testing was negative. BMP was within normal limits. In May and June 2018 patient was noted to have a white count between 3.4 x 3.6. His hemoglobin again at that time was between 9-10 and was macrocytic. Platelet count in June 2018 was 92. Iron studies and folate was within normal limits in May 2018. B12 level was normal at 590. CMP from May 2018 was normal. TSH was normal at 1.9 in May 2018.   Patient currently reports fatigue and bilateral lower extremity swelling. Overall he is a poor historian and unable to elaborate much on history. Patient was noted to be bradycardic on his recent visit to his PCP on 05/27/2017 and his metoprolol was held. He did undergo nuclear stress test which showed borderline abnormal myocardial perfusion with left ventricular enlargement and inferior defect  with apical borderline ischemia and ejection fraction of 53%. Medical therapy was recommended unless symptoms persist or worsen and consider cardiac cath at that point   Results of bloodwork from 06/12/2017 were as follows: CBC showed white count of 3.1, H&H of 10/29.6 with an MCV of 105.3 and a platelet count of 87. CMP was within normal limits except for early oh albumin of 2.8. Iron studies were within normal limits. LDH was normal and haptoglobin was mildly low at 23. Multiple myeloma panel did not reveal any monoclonal protein. Reticulocyte count was low at 1.5% indicating of a hypoproliferative anemia. Peripheral blood smear review showed macrocytic anemia. Neutropenia without hypersegmented neutrophils and immature cells on cytologic atypia.   Interval history- no acute issues since last visit. He has chronic back pain and leg pain  ECOG PS- 2 Pain scale- 4   Review of systems- Review of Systems  Constitutional: Positive for malaise/fatigue. Negative for chills, fever and weight loss.  HENT: Negative for congestion, ear discharge and nosebleeds.   Eyes: Negative for blurred vision.  Respiratory: Negative for cough, hemoptysis, sputum production, shortness of breath and wheezing.   Cardiovascular: Negative for chest pain, palpitations, orthopnea and claudication.  Gastrointestinal: Negative for abdominal pain, blood in stool, constipation, diarrhea, heartburn, melena, nausea and vomiting.  Genitourinary: Negative for dysuria, flank pain, frequency, hematuria and urgency.  Musculoskeletal: Positive for back pain and joint pain. Negative for myalgias.  Skin: Negative for rash.  Neurological: Negative for dizziness, tingling, focal weakness, seizures, weakness and headaches.  Endo/Heme/Allergies: Does not bruise/bleed easily.  Psychiatric/Behavioral: Negative for depression and suicidal ideas. The patient does not have  insomnia.       No Known Allergies   Past Medical History:    Diagnosis Date  . Asthma   . Atrial fibrillation ([place])   . Benign prostatic hypertrophy with lower urinary tract symptoms (LUTS)   . Bulbous urethral stricture   . CAD (coronary artery disease)    s/p coronary stent 2003  . Constipation   . COPD (chronic obstructive pulmonary disease) ([place])   . Diabetes mellitus without complication ([place])   . Dizziness   . Dyspnea   . GERD (gastroesophageal reflux disease)   . Gross hematuria   . Hyperlipemia   . Hypertension   . Lumbago   . Palpitation   . Schizophrenia [facility])      Past Surgical History:  Procedure Laterality Date  . COLONOSCOPY    . CORONARY ANGIOPLASTY WITH STENT PLACEMENT  11/2003  . kidney stent      Social History   Social History  . Marital status: Single    Spouse name: N/A  . Number of children: N/A  . Years of education: N/A   Occupational History  . Not on file.   Social History Main Topics  . Smoking status: Former Research scientist (life sciences)  . Smokeless tobacco: Never Used  . Alcohol use No  . Drug use: No  . Sexual activity: Not on file   Other Topics Concern  . Not on file   Social History Narrative  . No narrative on file    Family History  Problem Relation Age of Onset  . Diabetes Unknown   . Bladder Cancer Neg Hx   . Kidney cancer Neg Hx   . Prostate cancer Neg Hx      Current Outpatient Prescriptions:  .  acetaminophen (TYLENOL) 500 MG tablet, Take 500 mg by mouth every 6 (six) hours as needed., Disp: , Rfl:  .  amiodarone (PACERONE) 200 MG tablet, Take 0.5 tablets (100 mg total) by mouth daily., Disp: , Rfl:  .  aspirin 81 MG chewable tablet, Chew by mouth daily., Disp: , Rfl:  .  atorvastatin (LIPITOR) 10 MG tablet, Take 10 mg by mouth daily., Disp: , Rfl:  .  benztropine (COGENTIN) 1 MG tablet, Take by mouth., Disp: , Rfl:  .  citalopram (CELEXA) 20 MG tablet, Take 20 mg by mouth daily., Disp: , Rfl:  .  divalproex (DEPAKOTE ER) 500 MG 24 hr tablet, Take by mouth daily., Disp: , Rfl:  .   donepezil (ARICEPT) 5 MG tablet, Take 5 mg by mouth at bedtime., Disp: , Rfl:  .  ELIQUIS 5 MG TABS tablet, Take 5 mg by mouth 2 (two) times daily., Disp: , Rfl:  .  metFORMIN (GLUCOPHAGE) 500 MG tablet, Take 500 mg by mouth 2 (two) times daily with a meal., Disp: , Rfl:  .  ranitidine (ZANTAC) 150 MG tablet, Take 150 mg by mouth 2 (two) times daily., Disp: , Rfl:  .  risperidone (RISPERDAL) 4 MG tablet, Take 4 mg by mouth at bedtime., Disp: , Rfl:   Physical exam:  Vitals:   06/21/17 1201  BP: 117/70  Pulse: (!) 56  Resp: 18  Temp: (!) 95 F (35 C)  TempSrc: Tympanic  Weight: 190 lb (86.2 kg)   Physical Exam  Constitutional: He is oriented to person, place, and time and well-developed, well-nourished, and in no distress.  HENT:  Head: Normocephalic and atraumatic.  Eyes: Pupils are equal, round, and reactive to light. EOM are normal.  Neck: Normal range of  motion.  Cardiovascular: Normal rate, regular rhythm and normal heart sounds.   Pulmonary/Chest: Effort normal and breath sounds normal.  Abdominal: Soft. Bowel sounds are normal.  Musculoskeletal: He exhibits edema.  Neurological: He is alert and oriented to person, place, and time.  Skin: Skin is warm and dry.     CMP Latest Ref Rng & Units 06/12/2017  Glucose 65 - 99 mg/dL 75  BUN 6 - 20 mg/dL 18  Creatinine 0.61 - 1.24 mg/dL 1.09  Sodium 135 - 145 mmol/L 138  Potassium 3.5 - 5.1 mmol/L 4.6  Chloride 101 - 111 mmol/L 106  CO2 22 - 32 mmol/L 29  Calcium 8.9 - 10.3 mg/dL 8.5(L)  Total Protein 6.5 - 8.1 g/dL 5.7(L)  Total Bilirubin 0.3 - 1.2 mg/dL 0.5  Alkaline Phos 38 - 126 U/L 41  AST 15 - 41 U/L 20  ALT 17 - 63 U/L 10(L)   CBC Latest Ref Rng & Units 06/12/2017  WBC 3.8 - 10.6 K/uL 3.1(L)  Hemoglobin 13.0 - 18.0 g/dL 10.0(L)  Hematocrit 40.0 - 52.0 % 29.6(L)  Platelets 150 - 440 K/uL 87(L)      Assessment and plan- Patient is a 61 y.o. male with pancytopenia and macrocytic anemia concerning for MDS  Patient  has had long-standing macrocytosis H has worsened since this year. His hemoglobin has been around 10 and since 2016. He is always had a normal white count in the past but recent CBC showed white count of 3.1 with mild neutropenia which is new. Results of peripheral blood workup revealed hypoproliferative anemia. This could be suggestive of MDS. Medications as the patient is on are not known to cause pancytopenia. Patient lives in a group home because of his symptoms for any and also has ongoing cardiovascular issues. At this time I will repeat a CBC in 6 weeks time and I will see him back 3 months time with a repeat CBC with differential. I will also check a repeat B12 folate and flow cytometry in 6 weeks. If he has worsening pancytopenia proceed with a bone marrow biopsy to a certain the etiology   Visit Diagnosis 1. Macrocytic anemia   2. Other pancytopenia ([place])      Dr. Randa Evens, MD, MPH [facility] Pager- [phone] 06/21/2017 12:46 PM

## 2017-08-02 ENCOUNTER — Inpatient Hospital Stay: Payer: Medicare Other

## 2017-08-05 ENCOUNTER — Inpatient Hospital Stay: Payer: Medicare Other

## 2017-08-06 ENCOUNTER — Inpatient Hospital Stay: Payer: Medicare Other | Attending: Oncology

## 2017-08-06 DIAGNOSIS — I129 Hypertensive chronic kidney disease with stage 1 through stage 4 chronic kidney disease, or unspecified chronic kidney disease: Secondary | ICD-10-CM | POA: Diagnosis not present

## 2017-08-06 DIAGNOSIS — E1122 Type 2 diabetes mellitus with diabetic chronic kidney disease: Secondary | ICD-10-CM | POA: Insufficient documentation

## 2017-08-06 DIAGNOSIS — N189 Chronic kidney disease, unspecified: Secondary | ICD-10-CM | POA: Diagnosis not present

## 2017-08-06 DIAGNOSIS — D61818 Other pancytopenia: Secondary | ICD-10-CM | POA: Insufficient documentation

## 2017-08-06 DIAGNOSIS — D539 Nutritional anemia, unspecified: Secondary | ICD-10-CM

## 2017-08-06 LAB — CBC WITH DIFFERENTIAL/PLATELET
Basophils Absolute: 0 10*3/uL (ref 0–0.1)
Basophils Relative: 0 %
Eosinophils Absolute: 0.1 10*3/uL (ref 0–0.7)
Eosinophils Relative: 2 %
HCT: 28.8 % — ABNORMAL LOW (ref 40.0–52.0)
Hemoglobin: 10 g/dL — ABNORMAL LOW (ref 13.0–18.0)
Lymphocytes Relative: 52 %
Lymphs Abs: 2.1 10*3/uL (ref 1.0–3.6)
MCH: 35.3 pg — ABNORMAL HIGH (ref 26.0–34.0)
MCHC: 34.6 g/dL (ref 32.0–36.0)
MCV: 101.8 fL — ABNORMAL HIGH (ref 80.0–100.0)
Monocytes Absolute: 0.3 10*3/uL (ref 0.2–1.0)
Monocytes Relative: 6 %
Neutro Abs: 1.7 10*3/uL (ref 1.4–6.5)
Neutrophils Relative %: 40 %
Platelets: 108 10*3/uL — ABNORMAL LOW (ref 150–440)
RBC: 2.83 MIL/uL — ABNORMAL LOW (ref 4.40–5.90)
RDW: 14 % (ref 11.5–14.5)
WBC: 4.2 10*3/uL (ref 3.8–10.6)

## 2017-08-06 LAB — FOLATE: Folate: 18.9 ng/mL (ref >5.9)

## 2017-08-06 LAB — VITAMIN B12: Vitamin B-12: 725 pg/mL (ref 180–914)

## 2017-08-08 LAB — COMP PANEL: LEUKEMIA/LYMPHOMA: CLINICAL INFO: 18

## 2017-09-16 NOTE — Progress Notes (Deleted)
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 His past medical history is significant for asthma, COPD, atrial fibrillation, coronary artery disease and diabetes,chronic kidney disease among other medical problems. He was recently discharged from the hospital in July 2018 following an episode of sepsis and pneumonia along with uncontrolled A. fib. He is currently on amiodarone and eliquis forthe same. He has been referred to [place] for anemia. Recent CBC from 05/21/2017 showed white count of 7.9, H&H of 10.2/29.9 with an MCV of 103.7 and a platelet count of 93. HIV testing was negative. BMP was within normal limits. In May and June 2018 patient was noted to have a white count between 3.4 x 3.6. His hemoglobin again at that time was between 9-10 and was macrocytic. Platelet count in June 2018 was 92. Iron studies and folate was within normal limits in May 2018. B12 level was normal at 590. CMP from May 2018 was normal. TSH was normal at 1.9 in May 2018.   Patient currently reports fatigue and bilateral lower extremity swelling. Overall he is a poor historian and unable to elaborate much on history. Patient was noted to be bradycardic on his recent visit to his PCP on 05/27/2017 and his metoprolol was held. He did undergo nuclear stress test which showed borderline abnormal myocardial perfusion with left ventricular enlargement and inferior defect  with apical borderline ischemia and ejection fraction of 53%. Medical therapy was recommended unless symptoms persist or worsen and consider cardiac cath at that point   Results of bloodwork from 06/12/2017 were as follows: CBC showed white count of 3.1, H&H of 10/29.6 with an MCV of 105.3 and a platelet count of 87. CMP was within normal limits except for early oh albumin of 2.8. Iron studies were within normal limits. LDH was normal and haptoglobin was mildly low at 23. Multiple myeloma panel did not reveal any monoclonal protein. Reticulocyte count was low at 1.5% indicating of a hypoproliferative anemia. Peripheral blood smear review showed macrocytic anemia. Neutropenia without hypersegmented neutrophils and immature cells on cytologic atypia.   Interval history- ***  ECOG PS- *** Pain scale- *** Opioid associated constipation- ***  Review of systems- Review of Systems  Constitutional: Negative for chills, fever, malaise/fatigue and weight loss.  HENT: Negative for congestion, ear discharge and nosebleeds.   Eyes: Negative for blurred vision.  Respiratory: Negative for cough, hemoptysis, sputum production, shortness of breath and wheezing.   Cardiovascular: Negative for chest pain, palpitations, orthopnea and claudication.  Gastrointestinal: Negative for abdominal pain, blood in stool, constipation, diarrhea, heartburn, melena, nausea and vomiting.  Genitourinary: Negative for dysuria, flank pain, frequency, hematuria and urgency.  Musculoskeletal: Negative for back pain, joint pain and myalgias.  Skin: Negative for rash.  Neurological: Negative for dizziness, tingling, focal weakness, seizures, weakness and headaches.  Endo/Heme/Allergies: Does not bruise/bleed easily.  Psychiatric/Behavioral: Negative for depression and suicidal ideas. The patient does not have insomnia.       No Known Allergies   Past Medical History:  Diagnosis  Date  . Asthma   . Atrial fibrillation ([place])    . Benign prostatic hypertrophy with lower urinary tract symptoms (LUTS)   . Bulbous urethral stricture   . CAD (coronary artery disease)    s/p coronary stent 2003  . Constipation   . COPD (chronic obstructive pulmonary disease) ([place])   . Diabetes mellitus without complication ([place])   . Dizziness   . Dyspnea   . GERD (gastroesophageal reflux disease)   . Gross hematuria   . Hyperlipemia   . Hypertension   . Lumbago   . Palpitation   . Schizophrenia [facility])      Past Surgical History:  Procedure Laterality Date  . COLONOSCOPY    . CORONARY ANGIOPLASTY WITH STENT PLACEMENT  11/2003  . kidney stent      Social History   Social History  . Marital status: Single    Spouse name: N/A  . Number of children: N/A  . Years of education: N/A   Occupational History  . Not on file.   Social History Main Topics  . Smoking status: Former Research scientist (life sciences)  . Smokeless tobacco: Never Used  . Alcohol use No  . Drug use: No  . Sexual activity: Not on file   Other Topics Concern  . Not on file   Social History Narrative  . No narrative on file    Family History  Problem Relation Age of Onset  . Diabetes Unknown   . Bladder Cancer Neg Hx   . Kidney cancer Neg Hx   . Prostate cancer Neg Hx      Current Outpatient Prescriptions:  .  acetaminophen (TYLENOL) 500 MG tablet, Take 500 mg by mouth every 6 (six) hours as needed., Disp: , Rfl:  .  amiodarone (PACERONE) 200 MG tablet, Take 0.5 tablets (100 mg total) by mouth daily., Disp: , Rfl:  .  aspirin 81 MG chewable tablet, Chew by mouth daily., Disp: , Rfl:  .  atorvastatin (LIPITOR) 10 MG tablet, Take 10 mg by mouth daily., Disp: , Rfl:  .  benztropine (COGENTIN) 1 MG tablet, Take by mouth., Disp: , Rfl:  .  citalopram (CELEXA) 20 MG tablet, Take 20 mg by mouth daily., Disp: , Rfl:  .  divalproex (DEPAKOTE ER) 500 MG 24 hr tablet, Take by mouth daily., Disp: , Rfl:  .  donepezil (ARICEPT) 5 MG tablet, Take 5 mg by mouth at bedtime.,  Disp: , Rfl:  .  ELIQUIS 5 MG TABS tablet, Take 5 mg by mouth 2 (two) times daily., Disp: , Rfl:  .  metFORMIN (GLUCOPHAGE) 500 MG tablet, Take 500 mg by mouth 2 (two) times daily with a meal., Disp: , Rfl:  .  ranitidine (ZANTAC) 150 MG tablet, Take 150 mg by mouth 2 (two) times daily., Disp: , Rfl:  .  risperidone (RISPERDAL) 4 MG tablet, Take 4 mg by mouth at bedtime., Disp: , Rfl:   Physical exam: There were no vitals filed for this visit. Physical Exam  Constitutional: He is oriented to person, place, and time and well-developed, well-nourished, and in no distress.  HENT:  Head: Normocephalic and atraumatic.  Eyes: Pupils are equal, round, and reactive to light. EOM are normal.  Neck: Normal range of motion.  Cardiovascular: Normal rate, regular rhythm and normal heart sounds.   Pulmonary/Chest: Effort normal and breath sounds normal.  Abdominal: Soft. Bowel sounds are normal.  Neurological: He is alert and oriented to person, place, and time.  Skin: Skin is warm  and dry.     CMP Latest Ref Rng & Units 06/12/2017  Glucose 65 - 99 mg/dL 75  BUN 6 - 20 mg/dL 18  Creatinine 0.61 - 1.24 mg/dL 1.09  Sodium 135 - 145 mmol/L 138  Potassium 3.5 - 5.1 mmol/L 4.6  Chloride 101 - 111 mmol/L 106  CO2 22 - 32 mmol/L 29  Calcium 8.9 - 10.3 mg/dL 8.5(L)  Total Protein 6.5 - 8.1 g/dL 5.7(L)  Total Bilirubin 0.3 - 1.2 mg/dL 0.5  Alkaline Phos 38 - 126 U/L 41  AST 15 - 41 U/L 20  ALT 17 - 63 U/L 10(L)   CBC Latest Ref Rng & Units 08/06/2017  WBC 3.8 - 10.6 K/uL 4.2  Hemoglobin 13.0 - 18.0 g/dL 10.0(L)  Hematocrit 40.0 - 52.0 % 28.8(L)  Platelets 150 - 440 K/uL 108(L)       Assessment and plan- Patient is a 61 y.o. male ***   Visit Diagnosis No diagnosis found.   Dr. Randa Evens, MD, MPH [place] [facility] Pager- [phone] 09/16/2017 2:39 PM

## 2017-09-17 ENCOUNTER — Inpatient Hospital Stay: Payer: Medicare Other | Admitting: Oncology

## 2017-09-17 ENCOUNTER — Inpatient Hospital Stay: Payer: Medicare Other

## 2017-09-26 ENCOUNTER — Ambulatory Visit
Admission: RE | Admit: 2017-09-26 | Discharge: 2017-09-26 | Disposition: A | Discharge status: Home / Self Care | Payer: Medicare Other | Source: Ambulatory Visit | Attending: Family Medicine | Admitting: Family Medicine

## 2017-09-26 ENCOUNTER — Other Ambulatory Visit: Payer: Self-pay | Admitting: Family Medicine

## 2017-09-26 DIAGNOSIS — M6289 Other specified disorders of muscle: Secondary | ICD-10-CM | POA: Insufficient documentation

## 2017-09-26 DIAGNOSIS — M79605 Pain in left leg: Secondary | ICD-10-CM

## 2017-09-26 DIAGNOSIS — R600 Localized edema: Secondary | ICD-10-CM

## 2017-09-26 DIAGNOSIS — R590 Localized enlarged lymph nodes: Secondary | ICD-10-CM | POA: Insufficient documentation

## 2017-10-01 ENCOUNTER — Other Ambulatory Visit: Payer: Self-pay | Admitting: Family Medicine

## 2017-10-01 DIAGNOSIS — R936 Abnormal findings on diagnostic imaging of limbs: Secondary | ICD-10-CM

## 2017-10-02 ENCOUNTER — Telehealth: Payer: Self-pay | Admitting: *Deleted

## 2017-10-02 NOTE — Telephone Encounter (Signed)
Brian Dodson sent message that pt changing appt

## 2017-10-02 NOTE — Telephone Encounter (Signed)
-----   Message from Riccardo DubinDiana H Scruggs sent at 10/02/2017 10:02 AM EST ----- Regarding: appt lab Pt R/S appt from 09-17-17 to 10-25-17 for lab/md.  Wife mentioned they just drew labs from Dr Burnett ShengHedrick in [place] for kidney functions from a mass around the knee and L leg.  Karl Itoheyre trying to get an MRI approved for this.

## 2017-10-04 ENCOUNTER — Telehealth (INDEPENDENT_AMBULATORY_CARE_PROVIDER_SITE_OTHER): Payer: Self-pay

## 2017-10-04 ENCOUNTER — Ambulatory Visit (INDEPENDENT_AMBULATORY_CARE_PROVIDER_SITE_OTHER): Payer: Medicare Other | Admitting: Vascular Surgery

## 2017-10-04 ENCOUNTER — Encounter (INDEPENDENT_AMBULATORY_CARE_PROVIDER_SITE_OTHER): Payer: Self-pay | Admitting: Vascular Surgery

## 2017-10-04 VITALS — BP 118/69 | HR 83 | Resp 16 | Wt 192.0 lb

## 2017-10-04 DIAGNOSIS — E119 Type 2 diabetes mellitus without complications: Secondary | ICD-10-CM | POA: Diagnosis not present

## 2017-10-04 DIAGNOSIS — M7989 Other specified soft tissue disorders: Secondary | ICD-10-CM | POA: Insufficient documentation

## 2017-10-04 DIAGNOSIS — I10 Essential (primary) hypertension: Secondary | ICD-10-CM

## 2017-10-04 NOTE — Progress Notes (Signed)
Patient ID: Brian Dodson, male   DOB: 1956/09/15, 61 y.o.   MRN: [id number]  Chief Complaint  Patient presents with  . New Patient (Initial Visit)    Left Leg darkness and discoloration    HPI Brian Dodson is a 61 y.o. male.  I am asked to see the patient by Dr. Kary Kos for evaluation of marked left  Leg swelling.  The patient reports no clear inciting events or causative factors that started the swelling, but he is quite a poor historian.  The right lower extremity has some mild swelling, but over the past several weeks his left lower extremity has had marked swelling.  This is quite painful.  The caretaker that accompanies him today says this is actually significantly better than it was a week or so ago.  They have been using warm compresses on his leg.  They have been trying to elevate it some.  He underwent a venous duplex which did not show DVT or superficial thrombophlebitis, but a very large, approximately 20 cm mass that appeared to be a Baker's cyst was present.  He is actually scheduled for an MRI in about 2 weeks.  He denies a previous history of DVT or superficial thrombophlebitis.   Past Medical History:  Diagnosis Date  . Asthma   . Atrial fibrillation ([place])   . Benign prostatic hypertrophy with lower urinary tract symptoms (LUTS)   . Bulbous urethral stricture   . CAD (coronary artery disease)    s/p coronary stent 2003  . Constipation   . COPD (chronic obstructive pulmonary disease) ([place])   . Diabetes mellitus without complication ([place])   . Dizziness   . Dyspnea   . GERD (gastroesophageal reflux disease)   . Gross hematuria   . Hyperlipemia   . Hypertension   . Lumbago   . Palpitation   . Schizophrenia [facility])     Past Surgical History:  Procedure Laterality Date  . COLONOSCOPY    . CORONARY ANGIOPLASTY WITH STENT PLACEMENT  11/2003  . kidney stent      Family History  Problem Relation Age of Onset  . Diabetes Unknown   . Bladder Cancer Neg Hx   .  Kidney cancer Neg Hx   . Prostate cancer Neg Hx   No bleeding disorders or clotting disorders  Social History Social History   Tobacco Use  . Smoking status: Former Research scientist (life sciences)  . Smokeless tobacco: Never Used  Substance Use Topics  . Alcohol use: No  . Drug use: No  lives in a group home  No Known Allergies  Current Outpatient Medications  Medication Sig Dispense Refill  . acetaminophen (TYLENOL) 500 MG tablet Take 500 mg by mouth every 6 (six) hours as needed.    Marland Kitchen amiodarone (PACERONE) 200 MG tablet Take 0.5 tablets (100 mg total) by mouth daily.    Marland Kitchen aspirin 81 MG chewable tablet Chew by mouth daily.    Marland Kitchen atorvastatin (LIPITOR) 10 MG tablet Take 10 mg by mouth daily.    . benztropine (COGENTIN) 1 MG tablet Take by mouth.    . citalopram (CELEXA) 20 MG tablet Take 20 mg by mouth daily.    . divalproex (DEPAKOTE ER) 500 MG 24 hr tablet Take by mouth daily.    Marland Kitchen donepezil (ARICEPT) 5 MG tablet Take 5 mg by mouth at bedtime.    Marland Kitchen ELIQUIS 5 MG TABS tablet Take 5 mg by mouth 2 (two) times daily.    . metFORMIN (  GLUCOPHAGE) 500 MG tablet Take 500 mg by mouth 2 (two) times daily with a meal.    . ranitidine (ZANTAC) 150 MG tablet Take 150 mg by mouth 2 (two) times daily.    . risperidone (RISPERDAL) 4 MG tablet Take 4 mg by mouth at bedtime.    . cephALEXin (KEFLEX) 500 MG capsule Take 500 mg 4 (four) times daily by mouth.    . doxycycline (VIBRAMYCIN) 100 MG capsule Take by mouth.    . isosorbide mononitrate (IMDUR) 30 MG 24 hr tablet Take by mouth.    . ramipril (ALTACE) 5 MG capsule Take by mouth.     No current facility-administered medications for this visit.       REVIEW OF SYSTEMS (Negative unless checked)  Constitutional: '[]'$ Weight loss  '[]'$ Fever  '[]'$ Chills Cardiac: '[]'$ Chest pain   '[]'$ Chest pressure   '[x]'$ Palpitations   '[]'$ Shortness of breath when laying flat   '[]'$ Shortness of breath at rest   '[]'$ Shortness of breath with exertion. Vascular:  '[x]'$ Pain in legs with walking   '[x]'$ Pain  in legs at rest   '[]'$ Pain in legs when laying flat   '[]'$ Claudication   '[]'$ Pain in feet when walking  '[]'$ Pain in feet at rest  '[]'$ Pain in feet when laying flat   '[]'$ History of DVT   '[]'$ Phlebitis   '[x]'$ Swelling in legs   '[]'$ Varicose veins   '[]'$ Non-healing ulcers Pulmonary:   '[]'$ Uses home oxygen   '[]'$ Productive cough   '[]'$ Hemoptysis   '[]'$ Wheeze  '[]'$ COPD   '[]'$ Asthma Neurologic:  '[x]'$ Dizziness  '[]'$ Blackouts   '[]'$ Seizures   '[]'$ History of stroke   '[]'$ History of TIA  '[]'$ Aphasia   '[]'$ Temporary blindness   '[]'$ Dysphagia   '[]'$ Weakness or numbness in arms   '[]'$ Weakness or numbness in legs Musculoskeletal:  '[]'$ Arthritis   '[]'$ Joint swelling   '[]'$ Joint pain   '[]'$ Low back pain Hematologic:  '[]'$ Easy bruising  '[]'$ Easy bleeding   '[]'$ Hypercoagulable state   '[]'$ Anemic  '[]'$ Hepatitis Gastrointestinal:  '[]'$ Blood in stool   '[]'$ Vomiting blood  '[x]'$ Gastroesophageal reflux/heartburn   '[]'$ Abdominal pain Genitourinary:  '[]'$ Chronic kidney disease   '[]'$ Difficult urination  '[]'$ Frequent urination  '[]'$ Burning with urination   '[]'$ Hematuria Skin:  '[]'$ Rashes   '[]'$ Ulcers   '[]'$ Wounds Psychological:  '[x]'$ History of anxiety   '[x]'$  History of major depression.    Physical Exam BP 118/69 (BP Location: Left Arm)   Pulse 83   Resp 16   Wt 192 lb (87.1 kg)   BMI 26.04 kg/m  Gen:  WD/WN, NAD Head: [place]/AT, No temporalis wasting. Poor dentition Ear/Nose/Throat: Hearing grossly intact, nares w/o erythema or drainage, oropharynx w/o Erythema/Exudate Eyes: Conjunctiva clear, sclera non-icteric  Neck: trachea midline.  No JVD.  Pulmonary:  Good air movement, respirations not labored, no use of accessory muscles Cardiac: irregular Vascular:  Vessel Right Left  Radial Palpable Palpable                      Popliteal  not palpable  not palpable  PT  trace palpable  not palpable  DP  1+ palpable  not palpable   Gastrointestinal: soft, non-tender/non-distended. Musculoskeletal: M/S 5/5 throughout.  Extremities without ischemic changes.  No deformity or atrophy.  1-2+ right lower  extremity edema, 3-4+ left lower extremity edema. Neurologic: Sensation grossly intact in extremities.  Symmetrical.  Speech is fluent. Motor exam as listed above. Psychiatric: Judgment and insight are poor, not a good historian Dermatologic: No rashes or ulcers noted.  No cellulitis or open wounds.    Radiology US Venous Img  Lower Unilateral Left  Result Date: 09/26/2017 CLINICAL DATA:  Left leg pain.  Localized edema. EXAM: LEFT LOWER EXTREMITY VENOUS DOPPLER ULTRASOUND TECHNIQUE: Gray-scale sonography with graded compression, as well as color Doppler and duplex ultrasound were performed to evaluate the lower extremity deep venous systems from the level of the common femoral vein and including the common femoral, femoral, profunda femoral, popliteal and calf veins including the posterior tibial, peroneal and gastrocnemius veins when visible. The superficial great saphenous vein was also interrogated. Spectral Doppler was utilized to evaluate flow at rest and with distal augmentation maneuvers in the common femoral, femoral and popliteal veins. COMPARISON:  None. FINDINGS: Contralateral Common Femoral Vein: Respiratory phasicity is normal and symmetric with the symptomatic side. No evidence of thrombus. Normal compressibility. Common Femoral Vein: No evidence of thrombus. Normal compressibility, respiratory phasicity and response to augmentation. Saphenofemoral Junction: No evidence of thrombus. Normal compressibility and flow on color Doppler imaging. Profunda Femoral Vein: No evidence of thrombus. Normal compressibility and flow on color Doppler imaging. Femoral Vein: No evidence of thrombus. Normal compressibility, respiratory phasicity and response to augmentation. Popliteal Vein: No evidence of thrombus. Normal compressibility, respiratory phasicity and response to augmentation. Calf Veins: Limited visibility due to soft tissue swelling. No evidence of thrombosis. Superficial Great Saphenous Vein: No  evidence of thrombus. Normal compressibility. Venous Reflux:  None. Other Findings: Large complex masslike collection within the popliteal fossa, measuring 20 cm craniocaudal extent by 4 cm thickness. Multiple prominent lymph nodes are seen within the left inguinal region, largest measuring 2.8 x 0.9 cm. IMPRESSION: 1. No evidence of deep venous thrombosis, left lower extremity. 2. Large complex masslike collection within the popliteal fossa, extending into the distal calf, measuring 20 cm craniocaudal extent and approximately 4 cm thickness. This may represent a large benign Baker's cyst with internal debris. Infectious collection or hematoma cannot be excluded if any supporting clinical symptoms or recent trauma. Neoplastic process is considered less likely but atypical sarcomatous mass cannot be completely excluded. Consider nonemergent MRI to ensure benignity. 3. Prominent lymph nodes within the left groin region, possibly reactive. If any enlargement is noted by physical exam, would then recommend tissue sampling. Electronically Signed   By: Franki Cabot M.D.   On: 09/26/2017 18:40    Labs Recent Results (from the past 2160 hour(s))  CBC with Differential     Status: Abnormal   Collection Time: 08/06/17  8:27 AM  Result Value Ref Range   WBC 4.2 3.8 - 10.6 K/uL   RBC 2.83 (L) 4.40 - 5.90 MIL/uL   Hemoglobin 10.0 (L) 13.0 - 18.0 g/dL   HCT 28.8 (L) 40.0 - 52.0 %   MCV 101.8 (H) 80.0 - 100.0 fL   MCH 35.3 (H) 26.0 - 34.0 pg   MCHC 34.6 32.0 - 36.0 g/dL   RDW 14.0 11.5 - 14.5 %   Platelets 108 (L) 150 - 440 K/uL   Neutrophils Relative % 40 %   Neutro Abs 1.7 1.4 - 6.5 K/uL   Lymphocytes Relative 52 %   Lymphs Abs 2.1 1.0 - 3.6 K/uL   Monocytes Relative 6 %   Monocytes Absolute 0.3 0.2 - 1.0 K/uL   Eosinophils Relative 2 %   Eosinophils Absolute 0.1 0 - 0.7 K/uL   Basophils Relative 0 %   Basophils Absolute 0.0 0 - 0.1 K/uL  Flow cytometry panel-leukemia/lymphoma work-up     Status: None    Collection Time: 08/06/17  8:27 AM  Result Value Ref Range  PATH INTERP XXX-IMP Comment     Comment: No significant immunophenotypic abnormality detected   CLINICAL INFO 18     Comment: Comment Accompanying CBC dated 23 18 shows: WBC 4.2, Neu 1.7, Lym 2.1, Mon 0.3    Misc Source Comment     Comment: Peripheral blood   ASSESSMENT OF LEUKOCYTES Comment     Comment: (NOTE) No monoclonal B cell population is detected. kappa:lambda ratio 1.3 There is no loss of, or aberrant expression of, the pan T cell antigens to suggest a neoplastic T cell process. CD4:CD8 ratio 4.3 No circulating blasts are detected. There is no immunophenotypic  evidence of abnormal myeloid maturation. Analysis of the leukocyte population shows: granulocytes 50%, monocytes 5%, lymphocytes 45%, blasts <0.5%, B cells 3%, T cells 34%, NK cells 8%.    % Viable Cells Comment     Comment: 91%   ANALYSIS AND GATING STRATEGY Comment     Comment: 8 color analysis with CD45/SSC   IMMUNOPHENOTYPING STUDY Comment     Comment: (NOTE) CD2       Normal         CD3       Normal CD4       Normal         CD5       Normal CD7       Normal         CD8       Normal CD10      Normal         CD11b     Normal CD13      Normal         CD14      Normal CD16      Normal         CD19      Normal CD20      Normal         CD33      Normal CD34      Normal         CD38      Normal CD45      Normal         CD56      Normal CD57      Normal         CD117     Normal HLA-DR    Normal         KAPPA     Normal LAMBDA    Normal         CD64      Normal    PATHOLOGIST NAME Comment     Comment: Gaston Islam, M.D.   COMMENT: Comment     Comment: (NOTE) Each antibody in this assay was utilized to assess for potential abnormalities of studied cell populations or to characterize identified abnormalities. This test was developed and its performance characteristics determined by [facility].  It has not been cleared or approved by the U.S.  Food and Drug Administration. The FDA has determined that such clearance or approval is not necessary. This test is used for clinical purposes.  It should not be regarded as investigational or for research. Performed At: -[facility] [address] [place], [place] [id number] Nechama Guard MD OV:[id number] Performed At: [facility] [address] [place], [place] [id number] Nechama Guard MD HM:[id number]   Vitamin B12     Status: None   Collection Time: 08/06/17  8:27 AM  Result Value Ref Range  Vitamin B-12 725 180 - 914 pg/mL    Comment: (NOTE) This assay is not validated for testing neonatal or myeloproliferative syndrome specimens for Vitamin B12 levels. Performed at [facility], [place] [address], [place], [place] [address]   Folate     Status: None   Collection Time: 08/06/17  8:27 AM  Result Value Ref Range   Folate 18.9 >5.9 ng/mL    Assessment/Plan:  HTN (hypertension) blood pressure control important in reducing the progression of atherosclerotic disease. On appropriate oral medications.   Diabetes mellitus, type II blood glucose control important in reducing the progression of atherosclerotic disease. Also, involved in wound healing. On appropriate medications.   Swelling of limb The patient has marked left lower extremity swelling which appears to be due to a very large Baker's cyst.  I agree with the MRI to confirm this is a benign finding and not a malignancy given its very large size.  I have recommended he use compression stockings daily and have prescribed these today.  I believe he likely has a component of lymphedema given the massive swelling and the bilateral nature of his swelling, and this is likely due to chronic scarring and lymphatic channels.  A lymphedema pump may be a good adjuvant option and we can reassess this after he returns following his MRI study.  I have recommended he elevate his legs as much as possible but  continue to walk.      Leotis Pain 10/04/2017, 2:55 PM   This note was created with Dragon medical transcription system.  Any errors from dictation are unintentional.

## 2017-10-04 NOTE — Assessment & Plan Note (Signed)
The patient has marked left lower extremity swelling which appears to be due to a very large Baker's cyst.  I agree with the MRI to confirm this is a benign finding and not a malignancy given its very large size.  I have recommended he use compression stockings daily and have prescribed these today.  I believe he likely has a component of lymphedema given the massive swelling and the bilateral nature of his swelling, and this is likely due to chronic scarring and lymphatic channels.  A lymphedema pump may be a good adjuvant option and we can reassess this after he returns following his MRI study.  I have recommended he elevate his legs as much as possible but continue to walk.

## 2017-10-04 NOTE — Assessment & Plan Note (Signed)
blood glucose control important in reducing the progression of atherosclerotic disease. Also, involved in wound healing. On appropriate medications.  

## 2017-10-04 NOTE — Telephone Encounter (Signed)
Called Amada JupiterDale back and gave her Dr. Driscilla Grammesew's recommendation. See notes below.

## 2017-10-04 NOTE — Telephone Encounter (Signed)
Brian Dodson called wanting you to know that the patient has not had an MRI, it is scheduled for 10/14/17 he has had a ultrasound she wants to know if with this information will anything change.

## 2017-10-04 NOTE — Assessment & Plan Note (Signed)
blood pressure control important in reducing the progression of atherosclerotic disease. On appropriate oral medications.  

## 2017-10-04 NOTE — Patient Instructions (Signed)

## 2017-10-08 ENCOUNTER — Other Ambulatory Visit: Payer: Self-pay | Admitting: Family Medicine

## 2017-10-08 DIAGNOSIS — R936 Abnormal findings on diagnostic imaging of limbs: Secondary | ICD-10-CM

## 2017-10-09 ENCOUNTER — Encounter (INDEPENDENT_AMBULATORY_CARE_PROVIDER_SITE_OTHER): Payer: Self-pay

## 2017-10-09 ENCOUNTER — Other Ambulatory Visit: Payer: Self-pay | Admitting: Family Medicine

## 2017-10-09 DIAGNOSIS — R936 Abnormal findings on diagnostic imaging of limbs: Secondary | ICD-10-CM

## 2017-10-11 ENCOUNTER — Ambulatory Visit: Admission: RE | Admit: 2017-10-11 | Payer: Medicare Other | Source: Ambulatory Visit

## 2017-10-14 ENCOUNTER — Ambulatory Visit
Admission: RE | Admit: 2017-10-14 | Discharge: 2017-10-14 | Disposition: A | Discharge status: Home / Self Care | Payer: Medicare Other | Source: Ambulatory Visit | Attending: Family Medicine | Admitting: Family Medicine

## 2017-10-14 DIAGNOSIS — S8012XA Contusion of left lower leg, initial encounter: Secondary | ICD-10-CM | POA: Diagnosis not present

## 2017-10-14 DIAGNOSIS — X58XXXA Exposure to other specified factors, initial encounter: Secondary | ICD-10-CM | POA: Diagnosis not present

## 2017-10-14 DIAGNOSIS — R936 Abnormal findings on diagnostic imaging of limbs: Secondary | ICD-10-CM | POA: Diagnosis present

## 2017-10-14 DIAGNOSIS — M7122 Synovial cyst of popliteal space [Baker], left knee: Secondary | ICD-10-CM | POA: Diagnosis not present

## 2017-10-14 MED ORDER — GADOBENATE DIMEGLUMINE 529 MG/ML IV SOLN
18.0000 mL | Freq: Once | INTRAVENOUS | Status: AC | PRN
  Administered 2017-10-14: 18 mL via INTRAVENOUS

## 2017-10-15 ENCOUNTER — Telehealth (INDEPENDENT_AMBULATORY_CARE_PROVIDER_SITE_OTHER): Payer: Self-pay | Admitting: Vascular Surgery

## 2017-10-15 NOTE — Telephone Encounter (Signed)
Called the patient back to let him know to elevate and use compression. Per Dr. Wyn Quakerew, he will not drain the hematoma because this causes ulceration that could lead to profuse bleeding. The patient is already scheduled for a follow up in December.

## 2017-10-15 NOTE — Telephone Encounter (Signed)
New Message  Brian Dodson verbalized pt had MRI yesterday and pcp discovered pt has hematoma and want to know if Dew,MD wants to drain it.

## 2017-10-25 ENCOUNTER — Inpatient Hospital Stay: Payer: Medicare Other | Attending: Oncology

## 2017-10-25 ENCOUNTER — Inpatient Hospital Stay: Payer: Medicare Other | Admitting: Oncology

## 2017-11-01 ENCOUNTER — Ambulatory Visit (INDEPENDENT_AMBULATORY_CARE_PROVIDER_SITE_OTHER): Payer: Medicare Other | Admitting: Vascular Surgery

## 2017-12-08 ENCOUNTER — Emergency Department: Payer: Medicare Other

## 2017-12-08 ENCOUNTER — Other Ambulatory Visit: Payer: Self-pay

## 2017-12-08 ENCOUNTER — Emergency Department
Admission: EM | Admit: 2017-12-08 | Discharge: 2017-12-08 | Disposition: A | Discharge status: Home / Self Care | Payer: Medicare Other | Attending: Emergency Medicine | Admitting: Emergency Medicine

## 2017-12-08 DIAGNOSIS — R0600 Dyspnea, unspecified: Secondary | ICD-10-CM | POA: Insufficient documentation

## 2017-12-08 DIAGNOSIS — I2510 Atherosclerotic heart disease of native coronary artery without angina pectoris: Secondary | ICD-10-CM | POA: Insufficient documentation

## 2017-12-08 DIAGNOSIS — Z79899 Other long term (current) drug therapy: Secondary | ICD-10-CM | POA: Insufficient documentation

## 2017-12-08 DIAGNOSIS — Z7982 Long term (current) use of aspirin: Secondary | ICD-10-CM | POA: Insufficient documentation

## 2017-12-08 DIAGNOSIS — Z87891 Personal history of nicotine dependence: Secondary | ICD-10-CM | POA: Insufficient documentation

## 2017-12-08 DIAGNOSIS — I10 Essential (primary) hypertension: Secondary | ICD-10-CM | POA: Diagnosis not present

## 2017-12-08 DIAGNOSIS — J449 Chronic obstructive pulmonary disease, unspecified: Secondary | ICD-10-CM | POA: Diagnosis not present

## 2017-12-08 DIAGNOSIS — Z955 Presence of coronary angioplasty implant and graft: Secondary | ICD-10-CM | POA: Insufficient documentation

## 2017-12-08 DIAGNOSIS — R55 Syncope and collapse: Secondary | ICD-10-CM

## 2017-12-08 DIAGNOSIS — E119 Type 2 diabetes mellitus without complications: Secondary | ICD-10-CM | POA: Diagnosis not present

## 2017-12-08 LAB — CBC WITH DIFFERENTIAL/PLATELET
Basophils Absolute: 0 10*3/uL (ref 0–0.1)
Basophils Relative: 0 %
Eosinophils Absolute: 0 10*3/uL (ref 0–0.7)
Eosinophils Relative: 0 %
HCT: 32.1 % — ABNORMAL LOW (ref 40.0–52.0)
Hemoglobin: 10.5 g/dL — ABNORMAL LOW (ref 13.0–18.0)
Lymphocytes Relative: 27 %
Lymphs Abs: 1.2 10*3/uL (ref 1.0–3.6)
MCH: 32.9 pg (ref 26.0–34.0)
MCHC: 32.7 g/dL (ref 32.0–36.0)
MCV: 100.6 fL — ABNORMAL HIGH (ref 80.0–100.0)
Monocytes Absolute: 0.3 10*3/uL (ref 0.2–1.0)
Monocytes Relative: 7 %
Neutro Abs: 2.9 10*3/uL (ref 1.4–6.5)
Neutrophils Relative %: 66 %
Platelets: 128 10*3/uL — ABNORMAL LOW (ref 150–440)
RBC: 3.19 MIL/uL — ABNORMAL LOW (ref 4.40–5.90)
RDW: 15.6 % — ABNORMAL HIGH (ref 11.5–14.5)
WBC: 4.5 10*3/uL (ref 3.8–10.6)

## 2017-12-08 LAB — URINALYSIS, COMPLETE (UACMP) WITH MICROSCOPIC
Bilirubin Urine: NEGATIVE
Glucose, UA: NEGATIVE mg/dL
Hgb urine dipstick: NEGATIVE
Ketones, ur: 5 mg/dL — AB
Leukocytes, UA: NEGATIVE
Nitrite: NEGATIVE
Protein, ur: 30 mg/dL — AB
Specific Gravity, Urine: 1.023 (ref 1.005–1.030)
pH: 7 (ref 5.0–8.0)

## 2017-12-08 LAB — COMPREHENSIVE METABOLIC PANEL
ALT: 14 U/L — ABNORMAL LOW (ref 17–63)
AST: 24 U/L (ref 15–41)
Albumin: 3.8 g/dL (ref 3.5–5.0)
Alkaline Phosphatase: 50 U/L (ref 38–126)
Anion gap: 8 (ref 5–15)
BUN: 20 mg/dL (ref 6–20)
CO2: 28 mmol/L (ref 22–32)
Calcium: 9 mg/dL (ref 8.9–10.3)
Chloride: 102 mmol/L (ref 101–111)
Creatinine, Ser: 0.98 mg/dL (ref 0.61–1.24)
GFR calc Af Amer: >60 mL/min (ref >60)
GFR calc non Af Amer: >60 mL/min (ref >60)
Glucose, Bld: 124 mg/dL — ABNORMAL HIGH (ref 65–99)
Potassium: 4.1 mmol/L (ref 3.5–5.1)
Sodium: 138 mmol/L (ref 135–145)
Total Bilirubin: 0.5 mg/dL (ref 0.3–1.2)
Total Protein: 7 g/dL (ref 6.5–8.1)

## 2017-12-08 LAB — TROPONIN I: Troponin I: <0.03 ng/mL (ref <0.03)

## 2017-12-08 LAB — BRAIN NATRIURETIC PEPTIDE: B Natriuretic Peptide: 108 pg/mL — ABNORMAL HIGH (ref 0.0–100.0)

## 2017-12-08 MED ORDER — SODIUM CHLORIDE 0.9 % IV BOLUS (SEPSIS)
500.0000 mL | Freq: Once | INTRAVENOUS | Status: AC
  Administered 2017-12-08: 500 mL via INTRAVENOUS

## 2017-12-08 NOTE — ED Notes (Signed)
Report given to April. Ns 500cc bolus started.

## 2017-12-08 NOTE — ED Notes (Signed)
Pt's sister states she will take pt back to [facility]. [place] with [facility] notified.

## 2017-12-08 NOTE — ED Notes (Signed)
Spoke with brittany at [facility], report given for discharge. [place] to call her administrator to arrange transport back to facility.

## 2017-12-08 NOTE — ED Notes (Signed)
ivf continue to infuse slowly via gravity, approx of ns left in bag. Pt is sleeping, resps unlabored, call bell at right side.

## 2017-12-08 NOTE — ED Provider Notes (Signed)
[facility] Emergency Department Provider Note ____________________________________________   First MD Initiated Contact with Patient 12/08/17 1750     (approximate)  I have reviewed the triage vital signs and the nursing notes.   HISTORY  Chief Complaint Dizziness    HPI Brian Dodson is a 62 y.o. male with past medical history as noted below who presents with dizziness, acute onset this afternoon after he took a shower, described as feeling "swimmy headed" and like he would pass out, not spinning or vertigo.  The patient states that earlier today he was feeling normal.  He reports intermittent palpitations, but denies chest pain, difficulty breathing, vomiting or diarrhea, fevers, or other acute symptoms.  No headache.   Past Medical History:  Diagnosis Date  . Asthma   . Atrial fibrillation (HCC)   . Benign prostatic hypertrophy with lower urinary tract symptoms (LUTS)   . Bulbous urethral stricture   . CAD (coronary artery disease)    s/p coronary stent 2003  . Constipation   . COPD (chronic obstructive pulmonary disease) (HCC)   . Diabetes mellitus without complication (HCC)   . Dizziness   . Dyspnea   . GERD (gastroesophageal reflux disease)   . Gross hematuria   . Hyperlipemia   . Hypertension   . Lumbago   . Palpitation   . Schizophrenia [facility])     Patient Active Problem List   Diagnosis Date Noted  . Swelling of limb 10/04/2017  . Sepsis (HCC) 05/20/2017  . Atrial fibrillation with RVR (HCC) 05/23/2015  . Syncope 05/23/2015  . Schizophrenia (HCC) 05/23/2015  . Diabetes mellitus, type II (HCC) 05/23/2015  . HTN (hypertension) 05/23/2015  . GERD (gastroesophageal reflux disease) 05/23/2015  . CAD (coronary artery disease) 05/23/2015    Past Surgical History:  Procedure Laterality Date  . COLONOSCOPY    . CORONARY ANGIOPLASTY WITH STENT PLACEMENT  11/2003  . kidney stent      Prior to Admission medications   Medication Sig  Start Date End Date Taking? Authorizing Provider  acetaminophen (TYLENOL) 500 MG tablet Take 1,000 mg by mouth every 6 (six) hours as needed.    Yes [provider]  amiodarone (PACERONE) 200 MG tablet Take 0.5 tablets (100 mg total) by mouth daily. 05/23/17  Yes Houston Siren, MD  aspirin 81 MG chewable tablet Chew by mouth daily.   Yes [provider]  atorvastatin (LIPITOR) 10 MG tablet Take 10 mg by mouth daily. 05/18/15  Yes [provider]  benztropine (COGENTIN) 1 MG tablet Take 1 mg by mouth at bedtime.    Yes [provider]  calcium gluconate 500 MG tablet Take 1 tablet by mouth daily.   Yes [provider]  citalopram (CELEXA) 20 MG tablet Take 20 mg by mouth daily.   Yes [provider]  divalproex (DEPAKOTE ER) 500 MG 24 hr tablet Take 2,000 mg by mouth every evening.    Yes [provider]  donepezil (ARICEPT) 10 MG tablet Take 10 mg by mouth at bedtime.    Yes [provider]  ELIQUIS 5 MG TABS tablet Take 5 mg by mouth 2 (two) times daily. 05/18/15  Yes [provider]  latanoprost (XALATAN) 0.005 % ophthalmic solution Place 1 drop into both eyes at bedtime.   Yes [provider]  metFORMIN (GLUCOPHAGE) 500 MG tablet Take 500 mg by mouth 2 (two) times daily with a meal.   Yes [provider]  ranitidine (ZANTAC) 150  MG tablet Take 150 mg by mouth 2 (two) times daily.   Yes [provider]  risperidone (RISPERDAL) 4 MG tablet Take 4 mg by mouth at bedtime. 05/18/15  Yes [provider]    Allergies Patient has no known allergies.  Family History  Problem Relation Age of Onset  . Diabetes Unknown   . Bladder Cancer Neg Hx   . Kidney cancer Neg Hx   . Prostate cancer Neg Hx     Social History Social History   Tobacco Use  . Smoking status: Former Games developermoker  . Smokeless tobacco: Never Used  Substance Use Topics  . Alcohol use: No  . Drug use: No    Review  of Systems  Constitutional: No fever.   Eyes: No visual changes. ENT: No sore throat. Cardiovascular: Denies chest pain.  Positive for palpitations. Respiratory: Denies shortness of breath. Gastrointestinal: No nausea, no vomiting.  No diarrhea.  Genitourinary: Negative for dysuria.  Musculoskeletal: Negative for back pain. Skin: Negative for rash. Neurological: Negative for headaches, focal weakness or numbness.   ____________________________________________   PHYSICAL EXAM:  VITAL SIGNS: ED Triage Vitals  Enc Vitals Group     BP 12/08/17 1747 (!) 132/97     Pulse Rate 12/08/17 1747 81     Resp 12/08/17 1747 18     Temp 12/08/17 1747 97.8 F (36.6 C)     Temp Source 12/08/17 1747 Oral     SpO2 12/08/17 1747 100 %     Weight 12/08/17 1755 197 lb 9 oz (89.6 kg)     Height 12/08/17 1755 5\' 10"  (1.778 m)     Head Circumference --      Peak Flow --      Pain Score --      Pain Loc --      Pain Edu? --      Excl. in GC? --     Constitutional: Alert and oriented.  Comfortable appearing and in no acute distress. Eyes: Conjunctivae are normal.  EOMI. Head: Atraumatic. Nose: No congestion/rhinnorhea. Mouth/Throat: Mucous membranes are slightly dry.   Neck: Normal range of motion.  Cardiovascular: Normal rate, irregular rhythm. Grossly normal heart sounds.  Good peripheral circulation. Respiratory: Normal respiratory effort.  No retractions. Lungs CTAB. Gastrointestinal: Soft and nontender. No distention.  Genitourinary: No flank tenderness. Musculoskeletal: 1+ bilateral lower extremity edema.  Extremities warm and well perfused.  Neurologic:  Normal speech and language. No gross focal neurologic deficits are appreciated.  Motor intact in all extremities.  Normal coordination, with no ataxia. Skin:  Skin is warm and dry. No rash noted. Psychiatric: Mood and affect are normal. Speech and behavior are normal.  ____________________________________________   LABS (all labs  ordered are listed, but only abnormal results are displayed)  Labs Reviewed  COMPREHENSIVE METABOLIC PANEL - Abnormal; Notable for the following components:      Result Value   Glucose, Bld 124 (*)    ALT 14 (*)    All other components within normal limits  CBC WITH DIFFERENTIAL/PLATELET - Abnormal; Notable for the following components:   RBC 3.19 (*)    Hemoglobin 10.5 (*)    HCT 32.1 (*)    MCV 100.6 (*)    RDW 15.6 (*)    Platelets 128 (*)    All other components within normal limits  BRAIN NATRIURETIC PEPTIDE - Abnormal; Notable for the following components:   B Natriuretic Peptide 108.0 (*)    All other components within normal limits  URINALYSIS, COMPLETE (UACMP) WITH MICROSCOPIC - Abnormal; Notable for the following components:   Color, Urine YELLOW (*)    APPearance CLOUDY (*)    Ketones, ur 5 (*)    Protein, ur 30 (*)    Bacteria, UA RARE (*)    Squamous Epithelial / LPF 0-5 (*)    All other components within normal limits  TROPONIN I   ____________________________________________  EKG  ED ECG REPORT I, Dionne Bucy, the attending physician, personally viewed and interpreted this ECG.  Date: 12/08/2017 EKG Time: 1752 Rate: 69 Rhythm: Atrial fibrillation QRS Axis: normal Intervals: normal ST/T Wave abnormalities: Nonspecific lateral T wave abnormality Narrative Interpretation: no evidence of acute ischemia; no significant change when compared to EKG of 05/25/2015  ____________________________________________  RADIOLOGY  CXR: No focal infiltrate, no pulmonary edema.  ____________________________________________   PROCEDURES  Procedure(s) performed: No    Critical Care performed: No ____________________________________________   INITIAL IMPRESSION / ASSESSMENT AND PLAN / ED COURSE  Pertinent labs & imaging results that were available during my care of the patient were reviewed by me and considered in my medical decision making (see chart for  details).  62 year old male with schizophrenia, diabetes, COPD, coronary artery disease, BPH, hypertension, hyperlipidemia, chronic atrial fibrillation and other past medical history as noted above presents with feeling dizzy and lightheaded over the last few hours after shower, with lower extremity edema but no other acute symptoms.  Past medical records reviewed in Epic; the patient was last admitted approximately 6 months ago for sepsis, but no other recent ED visits.  On exam, the patient is relatively well-appearing, the vital signs are normal, and the remainder the exam is as described above with slightly dry mucous membranes and mild bilateral lower extremity edema.  Differential is broad but includes dehydration, other metabolic cause, infection, mild CHF exacerbation, resolved rapid atrial fibrillation, or less likely vertigo.  Plan for lab workup, UA, CXR and reassess.     Clinical Course as of Dec 08 2206  Wynelle Link Dec 08, 2017  2206 MCHC: 32.7 [SS]    Clinical Course User Index [SS] Dionne Bucy, MD   ----------------------------------------- 10:07 PM on 12/08/2017 -----------------------------------------  Patient's workup is unremarkable.  There is no evidence of pulmonary edema, his CBC is stable, and electrolytes are within normal limits.  Cardiac enzymes are negative, and patient is feeling significantly better after several hours in the ED and 500 cc bolus of fluid.  His UA is also negative.  At this time patient feels comfortable to go back to his facility.  2 of his family members including his sister are here now, and I discussed with them and with the patient the results of the workup, and the plan for discharge, and they expressed agreement.  Return precautions given and the patient and family members expressed understanding.  ____________________________________________   FINAL CLINICAL IMPRESSION(S) / ED DIAGNOSES  Final diagnoses:  Near syncope       NEW MEDICATIONS STARTED DURING THIS VISIT:  New Prescriptions   No medications on file     Note:  This document was prepared using Dragon voice recognition software and may include unintentional dictation errors.    Dionne Bucy, MD 12/08/17 2208

## 2017-12-08 NOTE — ED Notes (Signed)
Report from susan, rn.  

## 2017-12-08 NOTE — Discharge Instructions (Signed)
[name] should return to the emergency department for new or worsening weakness or lightheadedness, feeling like he will pass out, difficulty breathing, chest pain, vomiting, or any other new or worsening symptoms that concern him or facility staff.

## 2017-12-08 NOTE — ED Triage Notes (Signed)
States dizzy today after getting out of the shower. bil leg edema. Denies pain. afib for ems

## 2017-12-08 NOTE — ED Notes (Signed)
Pt provided with warm blankets. Pt aware of need for urine sample, pt denies further needs at this time.

## 2018-04-01 ENCOUNTER — Ambulatory Visit (INDEPENDENT_AMBULATORY_CARE_PROVIDER_SITE_OTHER): Payer: Medicare Other | Admitting: Vascular Surgery

## 2018-04-01 ENCOUNTER — Encounter (INDEPENDENT_AMBULATORY_CARE_PROVIDER_SITE_OTHER): Payer: Self-pay | Admitting: Vascular Surgery

## 2018-04-01 VITALS — BP 110/64 | HR 77 | Resp 13 | Ht 73.0 in | Wt 206.0 lb

## 2018-04-01 DIAGNOSIS — I10 Essential (primary) hypertension: Secondary | ICD-10-CM | POA: Diagnosis not present

## 2018-04-01 DIAGNOSIS — M79605 Pain in left leg: Secondary | ICD-10-CM

## 2018-04-01 DIAGNOSIS — R600 Localized edema: Secondary | ICD-10-CM | POA: Diagnosis not present

## 2018-04-01 DIAGNOSIS — M79604 Pain in right leg: Secondary | ICD-10-CM

## 2018-04-01 DIAGNOSIS — E119 Type 2 diabetes mellitus without complications: Secondary | ICD-10-CM

## 2018-04-01 NOTE — Progress Notes (Signed)
Subjective:    Patient ID: Brian Dodson, male    DOB: March 09, 1956, 62 y.o.   MRN: [id number] Chief Complaint  Patient presents with  . Follow-up    Leg issue   Patient last seen in November 2018 for evaluation of bilateral lower extremity edema.  At the time, the patient was encouraged to wear medical grade 1 compression socks and elevate his legs on a daily basis.  The patient had a right calf hematoma which was identified on MRI a few days later.  This has since resolved.  Patient is now residing in a new group home and reestablishing care today.  The patient does note some intermittent claudication to the left thigh with activity.  The patient has recently started wearing medical grade 1 compression socks and elevating his legs.  He has been doing this for approximately 3 months.  The patient has noted an improvement in his bilateral lower extremity swelling.  The patient was seen with a group home aide.  The patient denies any surgery or trauma to the bilateral lower extremity.  The patient denies any rest pain or ulceration to the bilateral lower extremity.  The patient denies any fever, nausea or vomiting.  Review of Systems  Constitutional: Negative.   HENT: Negative.   Eyes: Negative.   Respiratory: Negative.   Cardiovascular: Positive for leg swelling.       Left thigh claudication  Gastrointestinal: Negative.   Endocrine: Negative.   Genitourinary: Negative.   Musculoskeletal: Negative.   Skin: Negative.   Allergic/Immunologic: Negative.   Neurological: Negative.   Hematological: Negative.   Psychiatric/Behavioral: Negative.       Objective:   Physical Exam  Constitutional: He is oriented to person, place, and time. He appears well-developed and well-nourished. No distress.  HENT:  Head: Normocephalic and atraumatic.  Right Ear: External ear normal.  Left Ear: External ear normal.  Eyes: Pupils are equal, round, and reactive to light. Conjunctivae and EOM are normal.    Neck: Normal range of motion.  Cardiovascular: Normal rate, regular rhythm, normal heart sounds and intact distal pulses.  Pulses:      Radial pulses are 2+ on the right side, and 2+ on the left side.       Dorsalis pedis pulses are 1+ on the right side, and 1+ on the left side.       Posterior tibial pulses are 1+ on the right side, and 1+ on the left side.  Pulmonary/Chest: Effort normal and breath sounds normal.  Musculoskeletal: Normal range of motion. He exhibits no edema.  Neurological: He is alert and oriented to person, place, and time.  Skin: Skin is warm and dry. He is not diaphoretic.  Psychiatric: He has a normal mood and affect. His behavior is normal. Judgment and thought content normal.  Vitals reviewed.  BP 110/64 (BP Location: Right Arm, Patient Position: Sitting)   Pulse 77   Resp 13   Ht  (1.854 m)   Wt 206 lb (93.4 kg)   BMI 27.18 kg/m   Past Medical History:  Diagnosis Date  . Asthma   . Atrial fibrillation (HCC)   . Benign prostatic hypertrophy with lower urinary tract symptoms (LUTS)   . Bulbous urethral stricture   . CAD (coronary artery disease)    s/p coronary stent 2003  . Constipation   . COPD (chronic obstructive pulmonary disease) (HCC)   . Diabetes mellitus without complication (HCC)   . Dizziness   .  Dyspnea   . GERD (gastroesophageal reflux disease)   . Gross hematuria   . Hyperlipemia   . Hypertension   . Lumbago   . Palpitation   . Schizophrenia [facility])    Social History   Socioeconomic History  . Marital status: Single    Spouse name: Not on file  . Number of children: Not on file  . Years of education: Not on file  . Highest education level: Not on file  Occupational History  . Not on file  Social Needs  . Financial resource strain: Not on file  . Food insecurity:    Worry: Not on file    Inability: Not on file  . Transportation needs:    Medical: Not on file    Non-medical: Not on file  Tobacco Use  . Smoking  status: Former Games developer  . Smokeless tobacco: Never Used  Substance and Sexual Activity  . Alcohol use: No  . Drug use: No  . Sexual activity: Not on file  Lifestyle  . Physical activity:    Days per week: Not on file    Minutes per session: Not on file  . Stress: Not on file  Relationships  . Social connections:    Talks on phone: Not on file    Gets together: Not on file    Attends religious service: Not on file    Active member of club or organization: Not on file    Attends meetings of clubs or organizations: Not on file    Relationship status: Not on file  . Intimate partner violence:    Fear of current or ex partner: Not on file    Emotionally abused: Not on file    Physically abused: Not on file    Forced sexual activity: Not on file  Other Topics Concern  . Not on file  Social History Narrative  . Not on file   Past Surgical History:  Procedure Laterality Date  . COLONOSCOPY    . CORONARY ANGIOPLASTY WITH STENT PLACEMENT  11/2003  . kidney stent     Family History  Problem Relation Age of Onset  . Diabetes Unknown   . Bladder Cancer Neg Hx   . Kidney cancer Neg Hx   . Prostate cancer Neg Hx    No Known Allergies     Assessment & Plan:  Patient last seen in November 2018 for evaluation of bilateral lower extremity edema.  At the time, the patient was encouraged to wear medical grade 1 compression socks and elevate his legs on a daily basis.  The patient had a right calf hematoma which was identified on MRI a few days later.  This has since resolved.  Patient is now residing in a new group home and reestablishing care today.  The patient does note some intermittent claudication to the left thigh with activity.  The patient has recently started wearing medical grade 1 compression socks and elevating his legs.  He has been doing this for approximately 3 months.  The patient has noted an improvement in his bilateral lower extremity swelling.  The patient was seen with a  group home aide.  The patient denies any surgery or trauma to the bilateral lower extremity.  The patient denies any rest pain or ulceration to the bilateral lower extremity.  The patient denies any fever, nausea or vomiting.  1. Bilateral lower extremity edema - Stable Patient to continue engaging in conservative therapy since he is seeing improvement in his lower  extremity edema we reviewed the appropriate course for conservative therapy The patient was encouraged to wear graduated compression stockings (20-30 mmHg) on a daily basis. The patient was instructed to begin wearing the stockings first thing in the morning and removing them in the evening. The patient was instructed specifically not to sleep in the stockings. Prescription given.  In addition, behavioral modification including elevation during the day will be initiated. Anti-inflammatories for pain. The patient will follow up in three months to asses conservative management.  Information on chronic venous insufficiency and compression stockings was given to the patient. The patient was instructed to call the office in the interim if any worsening edema or ulcerations to the legs, feet or toes occurs. The patient expresses their understanding.  - VAS [place] LOWER EXTREMITY VENOUS REFLUX; Future  2. Lower extremity pain, bilateral - New Patient with multiple risk factors for peripheral artery disease Faint pedal pulses on exam Intermittent left thigh claudication I will bring the patient back at his convenience to undergo a bilateral ABI to rule out any contributing peripheral artery disease I have discussed with the patient at length the risk factors for and pathogenesis of atherosclerotic disease and encouraged a healthy diet, regular exercise regimen and blood pressure / glucose control.  The patient was encouraged to call the office in the interim if he experiences any claudication like symptoms, rest pain or ulcers to his feet /  toes  - VAS [place] ABI WITH/WO TBI; Future  3. Essential hypertension - Stable Encouraged good control as its slows the progression of atherosclerotic disease  4. Type 2 diabetes mellitus without complication, without long-term current use of insulin (HCC) - Stable Encouraged good control as its slows the progression of atherosclerotic disease  Current Outpatient Medications on File Prior to Visit  Medication Sig Dispense Refill  . acetaminophen (TYLENOL) 500 MG tablet Take 1,000 mg by mouth every 6 (six) hours as needed.     Marland Kitchen amiodarone (PACERONE) 200 MG tablet Take 0.5 tablets (100 mg total) by mouth daily.    Marland Kitchen amLODipine-benazepril (LOTREL) 5-10 MG capsule Take 1 capsule by mouth daily.    Marland Kitchen aspirin 81 MG chewable tablet Chew by mouth daily.    Marland Kitchen atenolol (TENORMIN) 25 MG tablet Take by mouth daily.    Marland Kitchen atorvastatin (LIPITOR) 10 MG tablet Take 10 mg by mouth daily.    . benztropine (COGENTIN) 1 MG tablet Take 1 mg by mouth at bedtime.     . calcium gluconate 500 MG tablet Take 1 tablet by mouth daily.    . citalopram (CELEXA) 20 MG tablet Take 20 mg by mouth daily.    . divalproex (DEPAKOTE ER) 500 MG 24 hr tablet Take 2,000 mg by mouth every evening.     . donepezil (ARICEPT) 10 MG tablet Take 10 mg by mouth at bedtime.     Marland Kitchen ELIQUIS 5 MG TABS tablet Take 5 mg by mouth 2 (two) times daily.    Marland Kitchen latanoprost (XALATAN) 0.005 % ophthalmic solution Place 1 drop into both eyes at bedtime.    . metFORMIN (GLUCOPHAGE) 500 MG tablet Take 500 mg by mouth 2 (two) times daily with a meal.    . ranitidine (ZANTAC) 150 MG tablet Take 150 mg by mouth 2 (two) times daily.    . risperidone (RISPERDAL) 4 MG tablet Take 4 mg by mouth at bedtime.     No current facility-administered medications on file prior to visit.    There are no  Patient Instructions on file for this visit. No follow-ups on file.  KIMBERLY A STEGMAYER, PA-C

## 2018-05-06 ENCOUNTER — Encounter (INDEPENDENT_AMBULATORY_CARE_PROVIDER_SITE_OTHER): Payer: Medicare Other

## 2018-05-06 ENCOUNTER — Other Ambulatory Visit (INDEPENDENT_AMBULATORY_CARE_PROVIDER_SITE_OTHER): Payer: Self-pay

## 2018-05-06 ENCOUNTER — Ambulatory Visit (INDEPENDENT_AMBULATORY_CARE_PROVIDER_SITE_OTHER): Payer: Medicare Other | Admitting: Vascular Surgery

## 2018-05-06 DIAGNOSIS — M79604 Pain in right leg: Secondary | ICD-10-CM

## 2018-05-06 DIAGNOSIS — M79605 Pain in left leg: Secondary | ICD-10-CM

## 2018-05-07 ENCOUNTER — Other Ambulatory Visit: Payer: Self-pay

## 2018-05-07 ENCOUNTER — Emergency Department: Payer: Medicare Other

## 2018-05-07 ENCOUNTER — Inpatient Hospital Stay
Admission: EM | Admit: 2018-05-07 | Discharge: 2018-05-08 | DRG: 871 | Disposition: A | Discharge status: Home / Self Care | Payer: Medicare Other | Source: Skilled Nursing Facility | Attending: Specialist | Admitting: Specialist

## 2018-05-07 DIAGNOSIS — E785 Hyperlipidemia, unspecified: Secondary | ICD-10-CM | POA: Diagnosis not present

## 2018-05-07 DIAGNOSIS — Z87891 Personal history of nicotine dependence: Secondary | ICD-10-CM | POA: Diagnosis not present

## 2018-05-07 DIAGNOSIS — G9341 Metabolic encephalopathy: Secondary | ICD-10-CM | POA: Diagnosis present

## 2018-05-07 DIAGNOSIS — N400 Enlarged prostate without lower urinary tract symptoms: Secondary | ICD-10-CM | POA: Diagnosis not present

## 2018-05-07 DIAGNOSIS — N3000 Acute cystitis without hematuria: Secondary | ICD-10-CM | POA: Diagnosis not present

## 2018-05-07 DIAGNOSIS — Z7982 Long term (current) use of aspirin: Secondary | ICD-10-CM

## 2018-05-07 DIAGNOSIS — E119 Type 2 diabetes mellitus without complications: Secondary | ICD-10-CM | POA: Diagnosis not present

## 2018-05-07 DIAGNOSIS — Z955 Presence of coronary angioplasty implant and graft: Secondary | ICD-10-CM

## 2018-05-07 DIAGNOSIS — F209 Schizophrenia, unspecified: Secondary | ICD-10-CM | POA: Diagnosis present

## 2018-05-07 DIAGNOSIS — A419 Sepsis, unspecified organism: Principal | ICD-10-CM | POA: Diagnosis present

## 2018-05-07 DIAGNOSIS — I2510 Atherosclerotic heart disease of native coronary artery without angina pectoris: Secondary | ICD-10-CM | POA: Diagnosis present

## 2018-05-07 DIAGNOSIS — I482 Chronic atrial fibrillation: Secondary | ICD-10-CM | POA: Diagnosis present

## 2018-05-07 DIAGNOSIS — J449 Chronic obstructive pulmonary disease, unspecified: Secondary | ICD-10-CM | POA: Diagnosis not present

## 2018-05-07 DIAGNOSIS — I10 Essential (primary) hypertension: Secondary | ICD-10-CM | POA: Diagnosis present

## 2018-05-07 DIAGNOSIS — F0390 Unspecified dementia without behavioral disturbance: Secondary | ICD-10-CM | POA: Diagnosis not present

## 2018-05-07 DIAGNOSIS — H409 Unspecified glaucoma: Secondary | ICD-10-CM | POA: Diagnosis present

## 2018-05-07 DIAGNOSIS — Z7984 Long term (current) use of oral hypoglycemic drugs: Secondary | ICD-10-CM

## 2018-05-07 DIAGNOSIS — R4182 Altered mental status, unspecified: Secondary | ICD-10-CM

## 2018-05-07 DIAGNOSIS — Z7901 Long term (current) use of anticoagulants: Secondary | ICD-10-CM | POA: Diagnosis not present

## 2018-05-07 LAB — COMPREHENSIVE METABOLIC PANEL
ALT: 15 U/L — ABNORMAL LOW (ref 17–63)
AST: 24 U/L (ref 15–41)
Albumin: 3.9 g/dL (ref 3.5–5.0)
Alkaline Phosphatase: 60 U/L (ref 38–126)
Anion gap: 9 (ref 5–15)
BUN: 18 mg/dL (ref 6–20)
CO2: 26 mmol/L (ref 22–32)
Calcium: 9 mg/dL (ref 8.9–10.3)
Chloride: 103 mmol/L (ref 101–111)
Creatinine, Ser: 0.96 mg/dL (ref 0.61–1.24)
GFR calc Af Amer: >60 mL/min (ref >60)
GFR calc non Af Amer: >60 mL/min (ref >60)
Glucose, Bld: 149 mg/dL — ABNORMAL HIGH (ref 65–99)
Potassium: 4.3 mmol/L (ref 3.5–5.1)
Sodium: 138 mmol/L (ref 135–145)
Total Bilirubin: 0.9 mg/dL (ref 0.3–1.2)
Total Protein: 7.7 g/dL (ref 6.5–8.1)

## 2018-05-07 LAB — LACTIC ACID, PLASMA
Lactic Acid, Venous: 2 mmol/L (ref 0.5–1.9)
Lactic Acid, Venous: 1.2 mmol/L (ref 0.5–1.9)

## 2018-05-07 LAB — CBC WITH DIFFERENTIAL/PLATELET
Basophils Absolute: 0 10*3/uL (ref 0–0.1)
Basophils Relative: 0 %
Eosinophils Absolute: 0 10*3/uL (ref 0–0.7)
Eosinophils Relative: 0 %
HCT: 33 % — ABNORMAL LOW (ref 40.0–52.0)
Hemoglobin: 11 g/dL — ABNORMAL LOW (ref 13.0–18.0)
Lymphocytes Relative: 11 %
Lymphs Abs: 1.2 10*3/uL (ref 1.0–3.6)
MCH: 32.6 pg (ref 26.0–34.0)
MCHC: 33.2 g/dL (ref 32.0–36.0)
MCV: 98 fL (ref 80.0–100.0)
Monocytes Absolute: 1.6 10*3/uL — ABNORMAL HIGH (ref 0.2–1.0)
Monocytes Relative: 14 %
Neutro Abs: 8.4 10*3/uL — ABNORMAL HIGH (ref 1.4–6.5)
Neutrophils Relative %: 75 %
Platelets: 133 10*3/uL — ABNORMAL LOW (ref 150–440)
RBC: 3.37 MIL/uL — ABNORMAL LOW (ref 4.40–5.90)
RDW: 14.9 % — ABNORMAL HIGH (ref 11.5–14.5)
WBC: 11.2 10*3/uL — ABNORMAL HIGH (ref 3.8–10.6)

## 2018-05-07 LAB — PROTIME-INR
INR: 1.18
Prothrombin Time: 14.9 seconds (ref 11.4–15.2)

## 2018-05-07 LAB — URINALYSIS, COMPLETE (UACMP) WITH MICROSCOPIC
Bilirubin Urine: NEGATIVE
Glucose, UA: 50 mg/dL — AB
Hgb urine dipstick: NEGATIVE
Ketones, ur: NEGATIVE mg/dL
Nitrite: POSITIVE — AB
Protein, ur: 30 mg/dL — AB
Specific Gravity, Urine: 1.02 (ref 1.005–1.030)
pH: 8 (ref 5.0–8.0)

## 2018-05-07 LAB — GLUCOSE, CAPILLARY
Glucose-Capillary: 109 mg/dL — ABNORMAL HIGH (ref 65–99)
Glucose-Capillary: 235 mg/dL — ABNORMAL HIGH (ref 65–99)

## 2018-05-07 LAB — TROPONIN I: Troponin I: <0.03 ng/mL (ref <0.03)

## 2018-05-07 MED ORDER — ACETAMINOPHEN 650 MG RE SUPP: 650.0000 mg | Freq: Four times a day (QID) | RECTAL | Status: DC | PRN

## 2018-05-07 MED ORDER — AMIODARONE HCL 200 MG PO TABS
100.0000 mg | ORAL_TABLET | Freq: Every day | ORAL | Status: DC
  Administered 2018-05-08: 08:00:00 100 mg via ORAL
  Filled 2018-05-07: qty 1

## 2018-05-07 MED ORDER — ATORVASTATIN CALCIUM 20 MG PO TABS
10.0000 mg | ORAL_TABLET | Freq: Every day | ORAL | Status: DC
  Administered 2018-05-08: 08:00:00 10 mg via ORAL
  Filled 2018-05-07: qty 1

## 2018-05-07 MED ORDER — ACETAMINOPHEN 500 MG PO TABS
1000.0000 mg | ORAL_TABLET | Freq: Once | ORAL | Status: AC
  Administered 2018-05-07: 1000 mg via ORAL
  Filled 2018-05-07: qty 2

## 2018-05-07 MED ORDER — ATENOLOL 25 MG PO TABS
25.0000 mg | ORAL_TABLET | Freq: Every day | ORAL | Status: DC
  Administered 2018-05-08: 25 mg via ORAL
  Filled 2018-05-07 (×2): qty 1

## 2018-05-07 MED ORDER — ONDANSETRON HCL 4 MG PO TABS: 4.0000 mg | ORAL_TABLET | Freq: Four times a day (QID) | ORAL | Status: DC | PRN

## 2018-05-07 MED ORDER — SODIUM CHLORIDE 0.9 % IV BOLUS
1000.0000 mL | Freq: Once | INTRAVENOUS | Status: AC
  Administered 2018-05-07: 1000 mL via INTRAVENOUS

## 2018-05-07 MED ORDER — APIXABAN 5 MG PO TABS
5.0000 mg | ORAL_TABLET | Freq: Two times a day (BID) | ORAL | Status: DC
  Administered 2018-05-07 – 2018-05-08 (×2): 5 mg via ORAL
  Filled 2018-05-07 (×2): qty 1

## 2018-05-07 MED ORDER — SODIUM CHLORIDE 0.9 % IV SOLN
1.0000 g | Freq: Once | INTRAVENOUS | Status: AC
  Administered 2018-05-07: 1 g via INTRAVENOUS
  Filled 2018-05-07: qty 10

## 2018-05-07 MED ORDER — FAMOTIDINE 20 MG PO TABS
10.0000 mg | ORAL_TABLET | Freq: Every day | ORAL | Status: DC
  Administered 2018-05-08: 10 mg via ORAL
  Filled 2018-05-07: qty 1

## 2018-05-07 MED ORDER — CALCIUM CARBONATE 1250 (500 CA) MG PO TABS
500.0000 mg | ORAL_TABLET | Freq: Every day | ORAL | Status: DC
Start: 2018-05-07 — End: 2018-05-08
  Filled 2018-05-07 (×2): qty 1

## 2018-05-07 MED ORDER — BENZTROPINE MESYLATE 1 MG PO TABS
1.0000 mg | ORAL_TABLET | Freq: Every day | ORAL | Status: DC
  Administered 2018-05-07: 21:00:00 1 mg via ORAL
  Filled 2018-05-07 (×2): qty 1

## 2018-05-07 MED ORDER — METFORMIN HCL 500 MG PO TABS
500.0000 mg | ORAL_TABLET | Freq: Two times a day (BID) | ORAL | Status: DC
  Administered 2018-05-07 – 2018-05-08 (×3): 500 mg via ORAL
  Filled 2018-05-07 (×3): qty 1

## 2018-05-07 MED ORDER — POLYETHYLENE GLYCOL 3350 17 G PO PACK: 17.0000 g | PACK | Freq: Every day | ORAL | Status: DC | PRN

## 2018-05-07 MED ORDER — DIVALPROEX SODIUM ER 500 MG PO TB24
2000.0000 mg | ORAL_TABLET | Freq: Every day | ORAL | Status: DC
  Administered 2018-05-07: 21:00:00 2000 mg via ORAL
  Filled 2018-05-07 (×2): qty 4

## 2018-05-07 MED ORDER — INSULIN ASPART 100 UNIT/ML ~~LOC~~ SOLN: 0.0000 [IU] | Freq: Three times a day (TID) | SUBCUTANEOUS | Status: DC

## 2018-05-07 MED ORDER — SODIUM CHLORIDE 0.9 % IV SOLN
1.0000 g | INTRAVENOUS | Status: DC
  Administered 2018-05-08: 16:00:00 1 g via INTRAVENOUS
  Filled 2018-05-07: qty 10
  Filled 2018-05-07: qty 1

## 2018-05-07 MED ORDER — ACETAMINOPHEN 325 MG PO TABS
650.0000 mg | ORAL_TABLET | Freq: Four times a day (QID) | ORAL | Status: DC | PRN
  Administered 2018-05-07 – 2018-05-08 (×2): 650 mg via ORAL
  Filled 2018-05-07 (×2): qty 2

## 2018-05-07 MED ORDER — CITALOPRAM HYDROBROMIDE 20 MG PO TABS
20.0000 mg | ORAL_TABLET | Freq: Every day | ORAL | Status: DC
  Administered 2018-05-07: 21:00:00 20 mg via ORAL
  Filled 2018-05-07: qty 1

## 2018-05-07 MED ORDER — RISPERIDONE 3 MG PO TABS
4.0000 mg | ORAL_TABLET | Freq: Every day | ORAL | Status: DC
  Administered 2018-05-07: 21:00:00 4 mg via ORAL
  Filled 2018-05-07 (×2): qty 1

## 2018-05-07 MED ORDER — ONDANSETRON HCL 4 MG/2ML IJ SOLN: 4.0000 mg | Freq: Four times a day (QID) | INTRAMUSCULAR | Status: DC | PRN

## 2018-05-07 MED ORDER — ASPIRIN EC 81 MG PO TBEC
81.0000 mg | DELAYED_RELEASE_TABLET | Freq: Every day | ORAL | Status: DC
  Administered 2018-05-08: 08:00:00 81 mg via ORAL
  Filled 2018-05-07: qty 1

## 2018-05-07 MED ORDER — LATANOPROST 0.005 % OP SOLN
1.0000 [drp] | Freq: Every day | OPHTHALMIC | Status: DC
Start: 2018-05-07 — End: 2018-05-08
  Administered 2018-05-07: 1 [drp] via OPHTHALMIC
  Filled 2018-05-07: qty 2.5

## 2018-05-07 MED ORDER — SODIUM CHLORIDE 0.9 % IV SOLN
Freq: Once | INTRAVENOUS | Status: AC
  Administered 2018-05-07: 18:00:00 via INTRAVENOUS

## 2018-05-07 MED ORDER — DONEPEZIL HCL 5 MG PO TABS
10.0000 mg | ORAL_TABLET | Freq: Every day | ORAL | Status: DC
  Administered 2018-05-07: 21:00:00 10 mg via ORAL
  Filled 2018-05-07 (×2): qty 2

## 2018-05-07 NOTE — ED Provider Notes (Signed)
[facility] Emergency Department Provider Note  ____________________________________________  Time seen: Approximately 1:41 PM  I have reviewed the triage vital signs and the nursing notes.   HISTORY  Chief Complaint Fever and Altered Mental Status    HPI Brian Dodson is a 62 y.o. male a history of A. fib on aspirin, COPD, CAD, BPH, presenting for fever and altered mental status.  The patient is unable to give any history due to his altered mental status.  Per report, the patient was at his nursing facility when he acutely became confused.  The patient denies any pain.  He arrives to the emergency department febrile to 39.2 but otherwise hemodynamically stable; he smells of foul-smelling urine.  Past Medical History:  Diagnosis Date  . Asthma   . Atrial fibrillation (HCC)   . Benign prostatic hypertrophy with lower urinary tract symptoms (LUTS)   . Bulbous urethral stricture   . CAD (coronary artery disease)    s/p coronary stent 2003  . Constipation   . COPD (chronic obstructive pulmonary disease) (HCC)   . Diabetes mellitus without complication (HCC)   . Dizziness   . Dyspnea   . GERD (gastroesophageal reflux disease)   . Gross hematuria   . Hyperlipemia   . Hypertension   . Lumbago   . Palpitation   . Schizophrenia [facility])     Patient Active Problem List   Diagnosis Date Noted  . Bilateral lower extremity edema 04/01/2018  . Lower extremity pain, bilateral 04/01/2018  . Swelling of limb 10/04/2017  . Sepsis (HCC) 05/20/2017  . Atrial fibrillation with RVR (HCC) 05/23/2015  . Syncope 05/23/2015  . Schizophrenia (HCC) 05/23/2015  . Diabetes mellitus, type II (HCC) 05/23/2015  . HTN (hypertension) 05/23/2015  . GERD (gastroesophageal reflux disease) 05/23/2015  . CAD (coronary artery disease) 05/23/2015    Past Surgical History:  Procedure Laterality Date  . COLONOSCOPY    . CORONARY ANGIOPLASTY WITH STENT PLACEMENT  11/2003  . kidney  stent      Current Outpatient Rx  . Order #: [id number] Class: Historical Med  . Order #: [id number] Class: No Print  . Order #: [id number] Class: Historical Med  . Order #: [id number] Class: Historical Med  . Order #: [id number] Class: Historical Med  . Order #: [id number] Class: Historical Med  . Order #: [id number] Class: Historical Med  . Order #: [id number] Class: Historical Med  . Order #: [id number] Class: Historical Med  . Order #: [id number] Class: Historical Med  . Order #: [id number] Class: Historical Med  . Order #: [id number] Class: Historical Med  . Order #: [id number] Class: Historical Med  . Order #: [id number] Class: Historical Med  . Order #: [id number] Class: Historical Med  . Order #: [id number] Class: Historical Med    Allergies Patient has no known allergies.  Family History  Problem Relation Age of Onset  . Diabetes Unknown   . Bladder Cancer Neg Hx   . Kidney cancer Neg Hx   . Prostate cancer Neg Hx     Social History Social History   Tobacco Use  . Smoking status: Former Games developer  . Smokeless tobacco: Never Used  Substance Use Topics  . Alcohol use: No  . Drug use: No    Review of Systems Able to obtain due to patient altered mental status or dementia.  ____________________________________________   PHYSICAL EXAM:  VITAL SIGNS: ED Triage Vitals [05/07/18 1325]  Enc Vitals Group     BP 132/62     Pulse Rate 76     Resp 20  Temp (!) 102.6 F (39.2 C)     Temp Source Oral     SpO2 100 %     Weight 200 lb (90.7 kg)     Height 6\' 1"  (1.854 m)     Head Circumference      Peak Flow      Pain Score 0     Pain Loc      Pain Edu?      Excl. in GC?     Constitutional: The patient is alert to person only; he does not know the year or where he is he has no insight into why he is here.  Answers questions appropriately.  CS is 15. Eyes: Conjunctivae are normal.  EOMI. No scleral icterus.  No eye discharge. Head: Atraumatic. Nose: No  congestion/rhinnorhea. Mouth/Throat: Mucous membranes are dry.  Neck: No stridor.  Supple.  No JVD.  No meningismus. Cardiovascular: Normal rate, regular rhythm. No murmurs, rubs or gallops.  Respiratory: Normal respiratory effort.  No accessory muscle use or retractions. Lungs CTAB.  No wheezes, rales or ronchi. Gastrointestinal: Soft, nontender and nondistended.  No guarding or rebound.  No peritoneal signs. Genitourinary: Foul-smelling urine peer Musculoskeletal: Bilateral symmetric lower extremity edema that is pitting to the mid thigh. Neurologic:  A&Ox1.  Speech is clear.  Face and smile are symmetric.  EOMI.  Moves all extremities well. Skin:  Skin is warm, dry and intact. No rash noted. Psychiatric: Mood and affect are normal.   ____________________________________________   LABS (all labs ordered are listed, but only abnormal results are displayed)  Labs Reviewed  CULTURE, BLOOD (ROUTINE X 2)  CULTURE, BLOOD (ROUTINE X 2)  URINE CULTURE  COMPREHENSIVE METABOLIC PANEL  LACTIC ACID, PLASMA  LACTIC ACID, PLASMA  CBC WITH DIFFERENTIAL/PLATELET  PROTIME-INR  URINALYSIS, COMPLETE (UACMP) WITH MICROSCOPIC  BLOOD GAS, VENOUS  TROPONIN I   ____________________________________________  EKG  ED ECG REPORT I, Rockne MenghiniNorman, Anne-Caroline, the attending physician, personally viewed and interpreted this ECG.   Date: 05/07/2018  EKG Time: 1332  Rate: 72  Rhythm: normal sinus rhythm; PAC  Axis: normal  Intervals:none  ST&T Change: NO STEMI  ____________________________________________  RADIOLOGY  No results found.  ____________________________________________   PROCEDURES  Procedure(s) performed: None  Procedures  Critical Care performed: Yes, see critical care note(s) ____________________________________________   INITIAL IMPRESSION / ASSESSMENT AND PLAN / ED COURSE  Pertinent labs & imaging results that were available during my care of the patient were reviewed  by me and considered in my medical decision making (see chart for details).  62 y.o. male with a history of schizophrenia, BPH, and multiple other chronic comorbidities presenting with fever and altered mental status, smelling a foul urine.  Overall, I am concerned that the patient has sepsis from a UTI.  We will initiate intravenous fluids, and antipyretic, and immediate antibiotics for UTI.  We will also get a chest x-ray as the patient is a poor historian and rule out pneumonia.  Blood cultures have been ordered as well.  I do not suspect meningitis today.  The patient will be admitted for further evaluation and treatment.  ----------------------------------------- 3:52 PM on 05/07/2018 -----------------------------------------  The patient has a UTI and has been covered for typical urinary pathogens.  He has been admitted to the hospitalists.  CRITICAL CARE Performed by: Rockne MenghiniNorman, Anne-Caroline   Total critical care time: 35 minutes  Critical care time was exclusive of separately billable procedures and treating other patients.  Critical care was necessary to treat  or prevent imminent or life-threatening deterioration.  Critical care was time spent personally by me on the following activities: development of treatment plan with patient and/or surrogate as well as nursing, discussions with consultants, evaluation of patient's response to treatment, examination of patient, obtaining history from patient or surrogate, ordering and performing treatments and interventions, ordering and review of laboratory studies, ordering and review of radiographic studies, pulse oximetry and re-evaluation of patient's condition.   ____________________________________________  FINAL CLINICAL IMPRESSION(S) / ED DIAGNOSES  Final diagnoses:  None         NEW MEDICATIONS STARTED DURING THIS VISIT:  New Prescriptions   No medications on file      Rockne Menghini, MD 05/07/18 1553

## 2018-05-07 NOTE — ED Triage Notes (Signed)
Sent from [facility] for AMS while at lunch. Per staff pt is usually alert/oriented and able to walk at baseline. Alert, oriented x2. +fever

## 2018-05-07 NOTE — Progress Notes (Signed)
CODE SEPSIS - PHARMACY COMMUNICATION  **Broad Spectrum Antibiotics should be administered within 1 hour of Sepsis diagnosis**  Time Code Sepsis Called/Page Received: 1341  Antibiotics Ordered: CTX  Time of 1st antibiotic administration:  Antibiotics Given (last 72 hours)     Date/Time Action Medication Dose Rate   05/07/18 1410 New Bag/Given   cefTRIAXone (ROCEPHIN) 1 g in sodium chloride 0.9 % 100 mL IVPB 1 g 200 mL/hr        Additional action taken by pharmacy: None   If necessary, Name of Provider/Nurse Contacted: none   Luan PullingGarrett Coffee, PharmD, MBA, BCGP Clinical Pharmacist 05/07/2018  2:44 PM

## 2018-05-07 NOTE — H&P (Signed)
[facility] - [place] at [facility]   PATIENT NAME: Brian Dodson    MR#:  [id number]  DATE OF BIRTH:  [birth year]  DATE OF ADMISSION:  05/07/2018  PRIMARY CARE PHYSICIAN: Jerl Mina, MD   REQUESTING/REFERRING PHYSICIAN: Dr. Sharma Covert  CHIEF COMPLAINT:  fever and altered mental status  HISTORY OF PRESENT ILLNESS:  Brian Dodson  is a 62 y.o. male with a known history of atrial fibrillation on aspirin, COPD, CAD, BPH, history of schizophrenia is a resident at [facility] comes to the emergency room with fever and altered mental status. Patient is a poor historian. He arrived to the emergency room was febrile at 39.2C. Hemodynamically stable has a foul-smelling urine is being admitted with sepsis secondary to UTI Lactic acid was elevated at 2.0  PAST MEDICAL HISTORY:   Past Medical History:  Diagnosis Date  . Asthma   . Atrial fibrillation (HCC)   . Benign prostatic hypertrophy with lower urinary tract symptoms (LUTS)   . Bulbous urethral stricture   . CAD (coronary artery disease)    s/p coronary stent 2003  . Constipation   . COPD (chronic obstructive pulmonary disease) (HCC)   . Diabetes mellitus without complication (HCC)   . Dizziness   . Dyspnea   . GERD (gastroesophageal reflux disease)   . Gross hematuria   . Hyperlipemia   . Hypertension   . Lumbago   . Palpitation   . Schizophrenia (HCC)     PAST SURGICAL HISTOIRY:   Past Surgical History:  Procedure Laterality Date  . COLONOSCOPY    . CORONARY ANGIOPLASTY WITH STENT PLACEMENT  11/2003  . kidney stent      SOCIAL HISTORY:   Social History   Tobacco Use  . Smoking status: Former Games developer  . Smokeless tobacco: Never Used  Substance Use Topics  . Alcohol use: No    FAMILY HISTORY:   Family History  Problem Relation Age of Onset  . Diabetes Unknown   . Bladder Cancer Neg Hx   . Kidney cancer Neg Hx   . Prostate cancer Neg Hx     DRUG ALLERGIES:  No Known  Allergies  REVIEW OF SYSTEMS:  Review of Systems  Unable to perform ROS: Mental acuity     MEDICATIONS AT HOME:   Prior to Admission medications   Medication Sig Start Date End Date Taking? Authorizing Provider  acetaminophen (TYLENOL) 500 MG tablet Take 1,000 mg by mouth every 6 (six) hours as needed.    Yes [provider]  amiodarone (PACERONE) 200 MG tablet Take 0.5 tablets (100 mg total) by mouth daily. 05/23/17  Yes Sainani, Rolly Pancake, MD  amLODipine (NORVASC) 5 MG tablet Take 5 mg by mouth daily.   Yes [provider]  aspirin 81 MG tablet Take 81 mg by mouth daily.    Yes [provider]  atenolol (TENORMIN) 25 MG tablet Take 25 mg by mouth daily.    Yes [provider]  atorvastatin (LIPITOR) 10 MG tablet Take 10 mg by mouth daily. 05/18/15  Yes [provider]  benztropine (COGENTIN) 1 MG tablet Take 1 mg by mouth at bedtime.    Yes [provider]  citalopram (CELEXA) 20 MG tablet Take 20 mg by mouth at bedtime.    Yes [provider]  divalproex (DEPAKOTE ER) 500 MG 24 hr tablet Take 2,000 mg by mouth at bedtime.    Yes [provider]  donepezil (ARICEPT) 10 MG tablet  Take 10 mg by mouth at bedtime.    Yes [provider]  ELIQUIS 5 MG TABS tablet Take 5 mg by mouth 2 (two) times daily. 05/18/15  Yes [provider]  latanoprost (XALATAN) 0.005 % ophthalmic solution Place 1 drop into both eyes at bedtime.   Yes [provider]  metFORMIN (GLUCOPHAGE) 500 MG tablet Take 500 mg by mouth 2 (two) times daily with a meal.   Yes [provider]  Oyster Shell (OYSTER CALCIUM) 500 MG TABS tablet Take 500 mg of elemental calcium by mouth daily.   Yes [provider]  ranitidine (ZANTAC) 150 MG tablet Take 150 mg by mouth 2 (two) times daily.   Yes [provider]  risperidone (RISPERDAL) 4 MG tablet Take 4 mg by mouth at bedtime. 05/18/15  Yes [provider]       VITAL SIGNS:  Blood pressure (!) 144/86, pulse (!) 124, temperature (!) 102.8 F (39.3 C), temperature source Oral, resp. rate 16, height 6\' 1"  (1.854 m), weight 93.9 kg (207 lb), SpO2 95 %.  PHYSICAL EXAMINATION:  GENERAL:  62 y.o.-year-old patient lying in the bed with no acute distress.  EYES: Pupils equal, round, reactive to light and accommodation. No scleral icterus. Extraocular muscles intact.  HEENT: Head atraumatic, normocephalic. Oropharynx and nasopharynx clear.  NECK:  Supple, no jugular venous distention. No thyroid enlargement, no tenderness.  LUNGS: Normal breath sounds bilaterally, no wheezing, rales,rhonchi or crepitation. No use of accessory muscles of respiration.  CARDIOVASCULAR: S1, S2 normal. No murmurs, rubs, or gallops.  ABDOMEN: Soft, nontender, nondistended. Bowel sounds present. No organomegaly or mass.  EXTREMITIES: No pedal edema, cyanosis, or clubbing.  NEUROLOGIC: Cranial nerves II through XII are intact. Muscle strength 5/5 in all extremities. Sensation intact. Gait not checked.  PSYCHIATRIC: The patient is alert and awake.  SKIN: No obvious rash, lesion, or ulcer.   LABORATORY PANEL:   CBC Recent Labs  Lab 05/07/18 1333  WBC 11.2*  HGB 11.0*  HCT 33.0*  PLT 133*   ------------------------------------------------------------------------------------------------------------------  Chemistries  Recent Labs  Lab 05/07/18 1333  NA 138  K 4.3  CL 103  CO2 26  GLUCOSE 149*  BUN 18  CREATININE 0.96  CALCIUM 9.0  AST 24  ALT 15*  ALKPHOS 60  BILITOT 0.9   ------------------------------------------------------------------------------------------------------------------  Cardiac Enzymes Recent Labs  Lab 05/07/18 1333  TROPONINI <0.03   ------------------------------------------------------------------------------------------------------------------  RADIOLOGY:  Dg Chest Port 1 View  Result Date: 05/07/2018 CLINICAL DATA:   Altered mental status at lunch today his assisted living facility. History of COPD, atrial fibrillation, schizophrenia. EXAM: PORTABLE CHEST 1 VIEW COMPARISON:  Chest x-ray of December 08, 2017 FINDINGS: The lungs are adequately inflated. There is no focal infiltrate. There is no pleural effusion. The heart and pulmonary vascularity are normal. The mediastinum is normal in width. There is no acute bony abnormality. IMPRESSION: There is no active cardiopulmonary disease. Electronically Signed   By: David  SwazilandJordan M.D.   On: 05/07/2018 14:00    EKG:    IMPRESSION AND PLAN:   Brian Dodson  is a 62 y.o. male with a known history of atrial fibrillation on eliquis COPD, CAD, BPH, history of schizophrenia is a resident at [facility] comes to the emergency room with fever and altered mental status. Patient is a poor historian. He arrived to the emergency room was febrile at 39.2C. Hemodynamically stable has a foul-smelling urine is being admitted with sepsis secondary to UTI  1. sepsis secondary to UTI -patient presented with fever elevated lactic acid and abnormal UA -IV Rocephin -IV fluids -repeat lactic acid is down to 1.2 -change to oral antibiotics when urine cultures available  2. history of atrial fibrillation -continue rate blocking agents and eliquis  3. Diabetes - continue home meds and sliding scale  4. Hypertension continue home meds  5. Hyperlipidemia on statins  No family members present All the records are reviewed and case discussed with ED provider. Management plans discussed with the patient, family and they are in agreement.  CODE STATUS:full  TOTAL TIME TAKING CARE OF THIS PATIENT: *50* minutes.    Brian Dodson M.D on 05/07/2018 at 9:21 PM  Between 7am to 6pm - Pager - [phone]  After 6pm go to [email or web address] - password [email or web address] [facility]  [facility]  Office  [phone]  CC: Primary care physician; Jerl Mina, MD

## 2018-05-07 NOTE — ED Notes (Signed)
Report called to receiving RN and pt updated with plan of care. VSS. 

## 2018-05-07 NOTE — ED Notes (Signed)
Straight cath for urine performed and specimen sent to lab. Pt cleaned and fresh diaper/linens applied. Pt is alert and able to follow commands. Denies pain currently. VSS. Call bell within reach, will continue to monitor.

## 2018-05-08 DIAGNOSIS — A419 Sepsis, unspecified organism: Secondary | ICD-10-CM | POA: Diagnosis not present

## 2018-05-08 LAB — CBC
HCT: 34 % — ABNORMAL LOW (ref 40.0–52.0)
Hemoglobin: 11.2 g/dL — ABNORMAL LOW (ref 13.0–18.0)
MCH: 32.5 pg (ref 26.0–34.0)
MCHC: 33.1 g/dL (ref 32.0–36.0)
MCV: 98 fL (ref 80.0–100.0)
Platelets: 113 10*3/uL — ABNORMAL LOW (ref 150–440)
RBC: 3.46 MIL/uL — ABNORMAL LOW (ref 4.40–5.90)
RDW: 14.9 % — ABNORMAL HIGH (ref 11.5–14.5)
WBC: 12.4 10*3/uL — ABNORMAL HIGH (ref 3.8–10.6)

## 2018-05-08 LAB — GLUCOSE, CAPILLARY
Glucose-Capillary: 74 mg/dL (ref 65–99)
Glucose-Capillary: 76 mg/dL (ref 65–99)
Glucose-Capillary: 154 mg/dL — ABNORMAL HIGH (ref 65–99)

## 2018-05-08 MED ORDER — CALCIUM CARBONATE ANTACID 500 MG PO CHEW
400.0000 mg | CHEWABLE_TABLET | Freq: Every day | ORAL | Status: DC
  Administered 2018-05-08: 08:00:00 400 mg via ORAL
  Filled 2018-05-08: qty 2

## 2018-05-08 MED ORDER — CEFUROXIME AXETIL 250 MG PO TABS: 250.0000 mg | ORAL_TABLET | Freq: Two times a day (BID) | ORAL | 0 refills | Status: AC

## 2018-05-08 NOTE — Clinical Social Work Note (Signed)
Patient guardian, Myriam ForehandKailee Morrow Jennings arrived on unit today. CSW spoke with her about patient discharging back to [facility] today. Guardian is in agreement with discharge. CSW also [facility] with [facility]. Liborio NixonJanice states that patient can return today and she will pick up patient around 5:30 today.   Ruthe Mannanandace Garrison MSW, [address]CSWA [phone]

## 2018-05-08 NOTE — NC FL2 (Signed)
[place] MEDICAID FL2 LEVEL OF CARE SCREENING TOOL     IDENTIFICATION  Patient Name: Brian Dodson Birthdate: 04-02-1956 Sex: male Admission Date (Current Location): 05/07/2018  [facility] and [place] Number:  Chiropodist and Address:  [facility], [address], [place], [place] [address]      Provider Number: [id number]  Attending Physician Name and Address:  Houston Siren, MD  Relative Name and Phone Number:       Current Level of Care: Hospital Recommended Level of Care: Memory Care Prior Approval Number:    Date Approved/Denied:   PASRR Number:    Discharge Plan: Other (Comment)(Memory Care )    Current Diagnoses:  Primary Diagnosis: Vascular Dementia  Patient Active Problem List   Diagnosis Date Noted  . Bilateral lower extremity edema 04/01/2018  . Lower extremity pain, bilateral 04/01/2018  . Swelling of limb 10/04/2017  . Sepsis (HCC) 05/20/2017  . Atrial fibrillation with RVR (HCC) 05/23/2015  . Syncope 05/23/2015  . Schizophrenia (HCC) 05/23/2015  . Diabetes mellitus, type II (HCC) 05/23/2015  . HTN (hypertension) 05/23/2015  . GERD (gastroesophageal reflux disease) 05/23/2015  . CAD (coronary artery disease) 05/23/2015    Orientation RESPIRATION BLADDER Height & Weight     Self, Place  Normal Continent Weight: 207 lb (93.9 kg) Height:  6\' 1"  (185.4 cm)  BEHAVIORAL SYMPTOMS/MOOD NEUROLOGICAL BOWEL NUTRITION STATUS  (none) (none) Continent Diet(Carb Modified)  AMBULATORY STATUS COMMUNICATION OF NEEDS Skin   Limited Assist Verbally Normal                       Personal Care Assistance Level of Assistance  Bathing, Feeding, Dressing Bathing Assistance: Limited assistance Feeding assistance: Independent Dressing Assistance: Limited assistance     Functional Limitations Info  Sight, Hearing, Speech Sight Info: Adequate Hearing Info: Adequate Speech Info: Adequate    SPECIAL CARE FACTORS FREQUENCY                       Contractures Contractures Info: Not present    Additional Factors Info  Code Status, Allergies Code Status Info: Full Code  Allergies Info: NKA            Discharge Medications: Please see discharge summary for a list of discharge medications. Medication List    TAKE these medications   acetaminophen 500 MG tablet Commonly known as:  TYLENOL Take 1,000 mg by mouth every 6 (six) hours as needed.   amiodarone 200 MG tablet Commonly known as:  PACERONE Take 0.5 tablets (100 mg total) by mouth daily.   amLODipine 5 MG tablet Commonly known as:  NORVASC Take 5 mg by mouth daily.   aspirin 81 MG tablet Take 81 mg by mouth daily.   atenolol 25 MG tablet Commonly known as:  TENORMIN Take 25 mg by mouth daily.   atorvastatin 10 MG tablet Commonly known as:  LIPITOR Take 10 mg by mouth daily.   benztropine 1 MG tablet Commonly known as:  COGENTIN Take 1 mg by mouth at bedtime.   cefUROXime 250 MG tablet Commonly known as:  CEFTIN Take 1 tablet (250 mg total) by mouth 2 (two) times daily with a meal for 5 days.   citalopram 20 MG tablet Commonly known as:  CELEXA Take 20 mg by mouth at bedtime.   divalproex 500 MG 24 hr tablet Commonly known as:  DEPAKOTE ER Take 2,000 mg by mouth at bedtime.  donepezil 10 MG tablet Commonly known as:  ARICEPT Take 10 mg by mouth at bedtime.   ELIQUIS 5 MG Tabs tablet Generic drug:  apixaban Take 5 mg by mouth 2 (two) times daily.   latanoprost 0.005 % ophthalmic solution Commonly known as:  XALATAN Place 1 drop into both eyes at bedtime.   metFORMIN 500 MG tablet Commonly known as:  GLUCOPHAGE Take 500 mg by mouth 2 (two) times daily with a meal.   oyster calcium 500 MG Tabs tablet Take 500 mg of elemental calcium by mouth daily.   ranitidine 150 MG tablet Commonly known as:  ZANTAC Take 150 mg by mouth 2 (two) times daily.   risperidone 4 MG tablet Commonly known  as:  RISPERDAL Take 4 mg by mouth at bedtime.       Relevant Imaging Results:  Relevant Lab Results:   Additional Information    Candace  Rinaldo RatelGarrison, [address]CSWA

## 2018-05-08 NOTE — Clinical Social Work Note (Signed)
Clinical Social Work Assessment  Patient Details  Name: Brian Dodson MRN: [id number] Date of Birth: [birth year]  Date of referral:  05/08/18               Reason for consult:  Facility Placement                Permission sought to share information with:  Case Manager, Magazine features editoracility Contact Representative, Family Supports Permission granted to share information::  Yes, Verbal Permission Granted  Name::        Agency::     Relationship::     Contact Information:     Housing/Transportation Living arrangements for the past 2 months:  Assisted Living Facility Source of Information:  Facility Patient Interpreter Needed:  None Criminal Activity/Legal Involvement Pertinent to Current Situation/Hospitalization:  No - Comment as needed Significant Relationships:  None Lives with:  Facility Resident Do you feel safe going back to the place where you live?  Yes Need for family participation in patient care:  Yes (Comment)  Care giving concerns:  [facility]   Social Worker assessment / plan:  CSW consulted for facility placement. CSW attempted to meet with patient but he is confused and guarded. CSW attempted to contact patient's guardian Myriam ForehandKailee Morrow Jennings [phone] but have not been successful in multiple attempts to reach her. CSW contacted [employer] CorporationJanice Worth from [facility]. Liborio NixonJanice states [facility] [address] [facility] would [facility] to return at discharge. Per Liborio NixonJanice patient has no family. CSW will continue to try and reach guardian.   Employment status:  Disabled (Comment on whether or not currently receiving Disability) Insurance information:  Medicare PT Recommendations:  Not assessed at this time Information / Referral to community resources:     Patient/Family's Response to care:  Patient is confused   Patient/Family's Understanding of and Emotional Response to Diagnosis, Current Treatment, and Prognosis:   Patient is confused and guardian unable to reach   Emotional Assessment Appearance:  Appears stated age Attitude/Demeanor/Rapport:    Affect (typically observed):  Restless, Quiet Orientation:  Oriented to Self Alcohol / Substance use:  Not Applicable Psych involvement (Current and /or in the community):  No (Comment)  Discharge Needs  Concerns to be addressed:  Discharge Planning Concerns Readmission within the last 30 days:  No Current discharge risk:  None Barriers to Discharge:  No Barriers Identified   Essie ChristineCandace  Garrison, LCSWA 05/08/2018, 1:52 PM

## 2018-05-08 NOTE — Discharge Summary (Signed)
[facility] [place] [facility]   PATIENT NAME: Brian Dodson    MR#:  [id number]  DATE OF BIRTH:  10/25/1956  DATE OF ADMISSION:  05/07/2018 ADMITTING PHYSICIAN: Sona Patel, MD  DATE OF DISCHARGE: No discharge date for patient encounter.  PRIMARY CARE PHYSICIAN: Hedrick, James, MD    ADMISSION DIAGNOSIS:  Acute cystitis without hematuria [N30.00] Sepsis, due to unspecified organism (HCC) [A41.9] Altered mental status, unspecified altered mental status type [R41.82]  DISCHARGE DIAGNOSIS:  Active Problems:   Sepsis (HCC)   SECONDARY DIAGNOSIS:   Past Medical History:  Diagnosis Date  . Asthma   . Atrial fibrillation (HCC)   . Benign prostatic hypertrophy with lower urinary tract symptoms (LUTS)   . Bulbous urethral stricture   . CAD (coronary artery disease)    s/p coronary stent 2003  . Constipation   . COPD (chronic obstructive pulmonary disease) (HCC)   . Diabetes mellitus without complication (HCC)   . Dizziness   . Dyspnea   . GERD (gastroesophageal reflux disease)   . Gross hematuria   . Hyperlipemia   . Hypertension   . Lumbago   . Palpitation   . Schizophrenia (HCC)     HOSPITAL COURSE:   61-year-old male with past medical history of schizophrenia, atrial fibrillation, hypertension, hyperlipidemia, COPD, history of coronary artery disease, BPH who presented to the hospital due to altered mental status, fever and noted to have urinary tract infection.  1.  Sepsis-patient met criteria admission given his fever, elevated lactic acid and abnormal urinalysis. -Patient was treated with IV ceftriaxone for the UTI, lactic acid has improved, his mental status is improved, he is been afebrile and hemodynamically stable and not being discharged on oral Ceftin.  2.  Urinary tract infection-source of patient's sepsis- patient was treated with IV ceftriaxone now being discharged on oral Ceftin.  3.  Altered mental status-metabolic encephalopathy  secondary to UTI.  Much improved and resolved with IV antibiotics now.  4.  History of chronic A, fibrillation-rate controlled -Patient will continue his atenolol, Amiodarone. He will cont. Eliquis.  5.  History of glaucoma-patient will continue his latanoprost eyedrops.  6.  Dementia- patient will continue his Aricept.  7.  History of schizophrenia-patient will continue his Risperdal.  8.  History of diabetes-patient's blood sugars remain stable.  He will continue his metformin.  DISCHARGE CONDITIONS:   Stable  CONSULTS OBTAINED:    DRUG ALLERGIES:  No Known Allergies  DISCHARGE MEDICATIONS:   Allergies as of 05/08/2018   No Known Allergies     Medication List    TAKE these medications   acetaminophen 500 MG tablet Commonly known as:  TYLENOL Take 1,000 mg by mouth every 6 (six) hours as needed.   amiodarone 200 MG tablet Commonly known as:  PACERONE Take 0.5 tablets (100 mg total) by mouth daily.   amLODipine 5 MG tablet Commonly known as:  NORVASC Take 5 mg by mouth daily.   aspirin 81 MG tablet Take 81 mg by mouth daily.   atenolol 25 MG tablet Commonly known as:  TENORMIN Take 25 mg by mouth daily.   atorvastatin 10 MG tablet Commonly known as:  LIPITOR Take 10 mg by mouth daily.   benztropine 1 MG tablet Commonly known as:  COGENTIN Take 1 mg by mouth at bedtime.   cefUROXime 250 MG tablet Commonly known as:  CEFTIN Take 1 tablet (250 mg total) by mouth 2 (two) times daily with a meal for 5 days.     citalopram 20 MG tablet Commonly known as:  CELEXA Take 20 mg by mouth at bedtime.   divalproex 500 MG 24 hr tablet Commonly known as:  DEPAKOTE ER Take 2,000 mg by mouth at bedtime.   donepezil 10 MG tablet Commonly known as:  ARICEPT Take 10 mg by mouth at bedtime.   ELIQUIS 5 MG Tabs tablet Generic drug:  apixaban Take 5 mg by mouth 2 (two) times daily.   latanoprost 0.005 % ophthalmic solution Commonly known as:  XALATAN Place 1 drop  into both eyes at bedtime.   metFORMIN 500 MG tablet Commonly known as:  GLUCOPHAGE Take 500 mg by mouth 2 (two) times daily with a meal.   oyster calcium 500 MG Tabs tablet Take 500 mg of elemental calcium by mouth daily.   ranitidine 150 MG tablet Commonly known as:  ZANTAC Take 150 mg by mouth 2 (two) times daily.   risperidone 4 MG tablet Commonly known as:  RISPERDAL Take 4 mg by mouth at bedtime.         DISCHARGE INSTRUCTIONS:   DIET:  Cardiac diet and Diabetic diet  DISCHARGE CONDITION:  Stable  ACTIVITY:  Activity as tolerated  OXYGEN:  Home Oxygen: No.   Oxygen Delivery: room air  DISCHARGE LOCATION:  group home   If you experience worsening of your admission symptoms, develop shortness of breath, life threatening emergency, suicidal or homicidal thoughts you must seek medical attention immediately by calling 911 or calling your MD immediately  if symptoms less severe.  You Must read complete instructions/literature along with all the possible adverse reactions/side effects for all the Medicines you take and that have been prescribed to you. Take any new Medicines after you have completely understood and accpet all the possible adverse reactions/side effects.   Please note  You were cared for by a hospitalist during your hospital stay. If you have any questions about your discharge medications or the care you received while you were in the hospital after you are discharged, you can call the unit and asked to speak with the hospitalist on call if the hospitalist that took care of you is not available. Once you are discharged, your primary care physician will handle any further medical issues. Please note that NO REFILLS for any discharge medications will be authorized once you are discharged, as it is imperative that you return to your primary care physician (or establish a relationship with a primary care physician if you do not have one) for your aftercare  needs so that they can reassess your need for medications and monitor your lab values.     Today   Mental status back to baseline.  No complaints. No fever, No other complaints.    VITAL SIGNS:  Blood pressure 118/77, pulse 71, temperature 98.3 F (36.8 C), temperature source Oral, resp. rate 18, height 6' 1" (1.854 m), weight 93.9 kg (207 lb), SpO2 98 %.  I/O:    Intake/Output Summary (Last 24 hours) at 05/08/2018 1239 Last data filed at 05/08/2018 0322 Gross per 24 hour  Intake -  Output 300 ml  Net -300 ml    PHYSICAL EXAMINATION:   GENERAL:  61 y.o.-year-old patient lying in the bed with no acute distress.  EYES: Pupils equal, round, reactive to light and accommodation. No scleral icterus. Extraocular muscles intact.  HEENT: Head atraumatic, normocephalic. Oropharynx and nasopharynx clear.  NECK:  Supple, no jugular venous distention. No thyroid enlargement, no tenderness.  LUNGS: Normal breath sounds bilaterally,   no wheezing, rales,rhonchi. No use of accessory muscles of respiration.  CARDIOVASCULAR: S1, S2 normal. No murmurs, rubs, or gallops.  ABDOMEN: Soft, non-tender, non-distended. Bowel sounds present. No organomegaly or mass.  EXTREMITIES: No pedal edema, cyanosis, or clubbing.  NEUROLOGIC: Cranial nerves II through XII are intact. No focal motor or sensory defecits b/l.  PSYCHIATRIC: The patient is alert and oriented x 3. SKIN: No obvious rash, lesion, or ulcer.   DATA REVIEW:   CBC Recent Labs  Lab 05/08/18 1019  WBC 12.4*  HGB 11.2*  HCT 34.0*  PLT 113*    Chemistries  Recent Labs  Lab 05/07/18 1333  NA 138  K 4.3  CL 103  CO2 26  GLUCOSE 149*  BUN 18  CREATININE 0.96  CALCIUM 9.0  AST 24  ALT 15*  ALKPHOS 60  BILITOT 0.9    Cardiac Enzymes Recent Labs  Lab 05/07/18 1333  TROPONINI <0.03    Microbiology Results  Results for orders placed or performed during the hospital encounter of 05/07/18  Culture, blood (Routine x 2)      Status: None (Preliminary result)   Collection Time: 05/07/18  1:33 PM  Result Value Ref Range Status   Specimen Description BLOOD BLOOD RIGHT FOREARM  Final   Special Requests   Final    BOTTLES DRAWN AEROBIC AND ANAEROBIC Blood Culture adequate volume   Culture   Final    NO GROWTH < 24 HOURS Performed at [facility], [address] [place], [place] [address]    Report Status PENDING  Incomplete  Culture, blood (Routine x 2)     Status: None (Preliminary result)   Collection Time: 05/07/18  1:34 PM  Result Value Ref Range Status   Specimen Description BLOOD BLOOD LEFT HAND  Final   Special Requests   Final    BOTTLES DRAWN AEROBIC AND ANAEROBIC Blood Culture adequate volume   Culture   Final    NO GROWTH < 24 HOURS Performed at [facility], [address] [place], [place] [address]    Report Status PENDING  Incomplete    RADIOLOGY:  Dg Chest Port 1 View  Result Date: 05/07/2018 CLINICAL DATA:  Altered mental status at lunch today his assisted living facility. History of COPD, atrial fibrillation, schizophrenia. EXAM: PORTABLE CHEST 1 VIEW COMPARISON:  Chest x-ray of December 08, 2017 FINDINGS: The lungs are adequately inflated. There is no focal infiltrate. There is no pleural effusion. The heart and pulmonary vascularity are normal. The mediastinum is normal in width. There is no acute bony abnormality. IMPRESSION: There is no active cardiopulmonary disease. Electronically Signed   By: David  Jordan M.D.   On: 05/07/2018 14:00      Management plans discussed with the patient, family and they are in agreement.  CODE STATUS:     Code Status Orders  (From admission, onward)        Start     Ordered   05/07/18 1726  Full code  Continuous     05/07/18 1725     TOTAL TIME TAKING CARE OF THIS PATIENT: 40 minutes.    SAINANI,VIVEK J M.D on 05/08/2018 at 12:39 PM  Between 7am to 6pm - Pager - [phone]  After 6pm go to [email or web address] - password  [email or web address]  [facility]  Office  [phone]  CC: Primary care physician; Hedrick, James, MD     

## 2018-05-08 NOTE — Progress Notes (Signed)
Pt has been discharged to [facility]. Discharge papers given and explained to Brian LemmingsJanice Worth, Resident care coordinator, verbalized understanding. Meds and f/u appointment reviewed. RX given.

## 2018-05-09 LAB — URINE CULTURE: Culture: 40000 — AB

## 2018-05-12 ENCOUNTER — Other Ambulatory Visit: Payer: Self-pay

## 2018-05-12 LAB — CULTURE, BLOOD (ROUTINE X 2)
Culture: NO GROWTH
Culture: NO GROWTH
Special Requests: ADEQUATE
Special Requests: ADEQUATE

## 2018-05-13 ENCOUNTER — Encounter: Payer: Self-pay | Admitting: Oncology

## 2018-05-13 ENCOUNTER — Other Ambulatory Visit: Payer: Self-pay

## 2018-05-13 ENCOUNTER — Inpatient Hospital Stay: Payer: Medicare Other | Attending: Oncology | Admitting: Oncology

## 2018-05-13 ENCOUNTER — Inpatient Hospital Stay: Payer: Medicare Other

## 2018-05-13 VITALS — BP 101/67 | HR 83 | Temp 98.0°F | Resp 18 | Ht 73.0 in | Wt 208.9 lb

## 2018-05-13 DIAGNOSIS — M7989 Other specified soft tissue disorders: Secondary | ICD-10-CM | POA: Insufficient documentation

## 2018-05-13 DIAGNOSIS — D539 Nutritional anemia, unspecified: Secondary | ICD-10-CM | POA: Diagnosis not present

## 2018-05-13 DIAGNOSIS — Z87891 Personal history of nicotine dependence: Secondary | ICD-10-CM | POA: Diagnosis not present

## 2018-05-13 DIAGNOSIS — D61818 Other pancytopenia: Secondary | ICD-10-CM

## 2018-05-13 DIAGNOSIS — R5383 Other fatigue: Secondary | ICD-10-CM | POA: Diagnosis not present

## 2018-05-13 DIAGNOSIS — R5381 Other malaise: Secondary | ICD-10-CM

## 2018-05-13 LAB — BLOOD GAS, VENOUS
Patient temperature: 37
pCO2, Ven: 53 mmHg (ref 44.0–60.0)
pH, Ven: 7.35 (ref 7.250–7.430)

## 2018-05-13 LAB — CBC WITH DIFFERENTIAL/PLATELET
Basophils Absolute: 0 10*3/uL (ref 0–0.1)
Basophils Relative: 1 %
Eosinophils Absolute: 0.5 10*3/uL (ref 0–0.7)
Eosinophils Relative: 7 %
HCT: 30 % — ABNORMAL LOW (ref 40.0–52.0)
Hemoglobin: 10.1 g/dL — ABNORMAL LOW (ref 13.0–18.0)
Lymphocytes Relative: 39 %
Lymphs Abs: 2.6 10*3/uL (ref 1.0–3.6)
MCH: 32.5 pg (ref 26.0–34.0)
MCHC: 33.6 g/dL (ref 32.0–36.0)
MCV: 96.6 fL (ref 80.0–100.0)
Monocytes Absolute: 0.7 10*3/uL (ref 0.2–1.0)
Monocytes Relative: 11 %
Neutro Abs: 2.8 10*3/uL (ref 1.4–6.5)
Neutrophils Relative %: 42 %
Platelets: 201 10*3/uL (ref 150–440)
RBC: 3.11 MIL/uL — ABNORMAL LOW (ref 4.40–5.90)
RDW: 14.6 % — ABNORMAL HIGH (ref 11.5–14.5)
WBC: 6.7 10*3/uL (ref 3.8–10.6)

## 2018-05-13 NOTE — Progress Notes (Signed)
Hematology/Oncology Consult note [facility]  Telephone:([phone] [phone]  Patient Care Team: [name], MD as PCP - General (Family Medicine) [name], MD as Consulting Physician (Cardiology)   Name of the patient: Brian Dodson  [id number]  Jun 29, 1956   Date of visit: 05/13/18  Diagnosis- leukopenia and thrombocytopenia   Chief complaint/ Reason for visit- routine f/u of leukopenia and thrombocytopenia  Heme/Onc history: Patient is a 62 year old African-American male with a prior history of schizophrenia who currently resides in a group home. His past medical history is significant for asthma, COPD, atrial fibrillation, coronary artery disease and diabetes,chronic kidney disease among other medical problems. He was recently discharged from the hospital in July 2018 following an episode of sepsis and pneumonia along with uncontrolled A. fib. He is currently on amiodarone and eliquis forthe same. He has been referred to [place] for anemia. Recent CBC from 05/21/2017 showed white count of 7.9, H&H of 10.2/29.9 with an MCV of 103.7 and a platelet count of 93. HIV testing was negative. BMP was within normal limits. In May and June 2018 patient was noted to have a white count between 3.4 x 3.6. His hemoglobin again at that time was between 9-10 and was macrocytic. Platelet count in June 2018 was 92. Iron studies and folate was within normal limits in May 2018. B12 level was normal at 590. CMP from May 2018 was normal. TSH was normal at 1.9 in May 2018.   Patient currently reports fatigue and bilateral lower extremity swelling. Overall he is a poor historian and unable to elaborate much on history. Patient was noted to be bradycardic on his recent visit to his PCP on 05/27/2017 and his metoprolol was held. He did undergo nuclear stress test which showed borderline abnormal myocardial perfusion with left ventricular enlargement and inferior  defect with apical borderline ischemia and ejection fraction of 53%. Medical therapy was recommended unless symptoms persist or worsen and consider cardiac cath at that point   Results of bloodwork from 06/12/2017 were as follows: CBC showed white count of 3.1, H&H of 10/29.6 with an MCV of 105.3 and a platelet count of 87. CMP was within normal limits except for early oh albumin of 2.8. Iron studies were within normal limits. LDH was normal and haptoglobin was mildly low at 23. Multiple myeloma panel did not reveal any monoclonal protein. Reticulocyte count was low at 1.5% indicating of a hypoproliferative anemia. Peripheral blood smear review showed macrocytic anemia. Neutropenia without hypersegmented neutrophils and immature cells on cytologic atypia.     Interval history- he is doing well at his group home. He was recently admitted and treated for UTI and is doing better since then. Leg swelling is stable  ECOG PS- 1 Pain scale- 0   Review of systems- Review of Systems  Constitutional: Positive for malaise/fatigue. Negative for chills, fever and weight loss.  HENT: Negative for congestion, ear discharge and nosebleeds.   Eyes: Negative for blurred vision.  Respiratory: Negative for cough, hemoptysis, sputum production, shortness of breath and wheezing.   Cardiovascular: Positive for leg swelling. Negative for chest pain, palpitations, orthopnea and claudication.  Gastrointestinal: Negative for abdominal pain, blood in stool, constipation, diarrhea, heartburn, melena, nausea and vomiting.  Genitourinary: Negative for dysuria, flank pain, frequency, hematuria and urgency.  Musculoskeletal: Negative for back pain, joint pain and myalgias.  Skin: Negative for rash.  Neurological: Negative for dizziness, tingling, focal weakness, seizures, weakness and headaches.  Endo/Heme/Allergies: Does not bruise/bleed easily.  Psychiatric/Behavioral: Negative for depression and suicidal ideas. The  patient does not have insomnia.       Not on File   Past Medical History:  Diagnosis Date  . Asthma   . Atrial fibrillation ([place])   . Benign prostatic hypertrophy with lower urinary tract symptoms (LUTS)   . Bulbous urethral stricture   . CAD (coronary artery disease)    s/p coronary stent 2003  . Constipation   . COPD (chronic obstructive pulmonary disease) ([place])   . Diabetes mellitus without complication ([place])   . Dizziness   . Dyspnea   . GERD (gastroesophageal reflux disease)   . Gross hematuria   . Hyperlipemia   . Hypertension   . Lumbago   . Palpitation   . Schizophrenia [facility])      Past Surgical History:  Procedure Laterality Date  . COLONOSCOPY    . CORONARY ANGIOPLASTY WITH STENT PLACEMENT  11/2003  . kidney stent      Social History   Socioeconomic History  . Marital status: Single    Spouse name: Not on file  . Number of children: Not on file  . Years of education: Not on file  . Highest education level: Not on file  Occupational History  . Not on file  Social Needs  . Financial resource strain: Not on file  . Food insecurity:    Worry: Not on file    Inability: Not on file  . Transportation needs:    Medical: Not on file    Non-medical: Not on file  Tobacco Use  . Smoking status: Former Research scientist (life sciences)  . Smokeless tobacco: Never Used  Substance and Sexual Activity  . Alcohol use: No  . Drug use: No  . Sexual activity: Not on file  Lifestyle  . Physical activity:    Days per week: Not on file    Minutes per session: Not on file  . Stress: Not on file  Relationships  . Social connections:    Talks on phone: Not on file    Gets together: Not on file    Attends religious service: Not on file    Active member of club or organization: Not on file    Attends meetings of clubs or organizations: Not on file    Relationship status: Not on file  . Intimate partner violence:    Fear of current or ex partner: Not on file    Emotionally abused: Not on  file    Physically abused: Not on file    Forced sexual activity: Not on file  Other Topics Concern  . Not on file  Social History Narrative  . Not on file    Family History  Problem Relation Age of Onset  . Diabetes Unknown   . Bladder Cancer Neg Hx   . Kidney cancer Neg Hx   . Prostate cancer Neg Hx      Current Outpatient Medications:  .  amiodarone (PACERONE) 200 MG tablet, Take 0.5 tablets (100 mg total) by mouth daily., Disp: , Rfl:  .  amLODipine (NORVASC) 5 MG tablet, Take 5 mg by mouth daily., Disp: , Rfl:  .  aspirin 81 MG tablet, Take 81 mg by mouth daily. , Disp: , Rfl:  .  atenolol (TENORMIN) 25 MG tablet, Take 25 mg by mouth daily. , Disp: , Rfl:  .  atorvastatin (LIPITOR) 10 MG tablet, Take 10 mg by mouth daily., Disp: , Rfl:  .  benztropine (COGENTIN) 1 MG tablet, Take  1 mg by mouth at bedtime. , Disp: , Rfl:  .  cefUROXime (CEFTIN) 250 MG tablet, Take 1 tablet (250 mg total) by mouth 2 (two) times daily with a meal for 5 days., Disp: 10 tablet, Rfl: 0 .  citalopram (CELEXA) 20 MG tablet, Take 20 mg by mouth at bedtime. , Disp: , Rfl:  .  divalproex (DEPAKOTE ER) 500 MG 24 hr tablet, Take 2,000 mg by mouth at bedtime. , Disp: , Rfl:  .  donepezil (ARICEPT) 10 MG tablet, Take 10 mg by mouth at bedtime. , Disp: , Rfl:  .  ELIQUIS 5 MG TABS tablet, Take 5 mg by mouth 2 (two) times daily., Disp: , Rfl:  .  latanoprost (XALATAN) 0.005 % ophthalmic solution, Place 1 drop into both eyes at bedtime., Disp: , Rfl:  .  metFORMIN (GLUCOPHAGE) 500 MG tablet, Take 500 mg by mouth 2 (two) times daily with a meal., Disp: , Rfl:  .  Oyster Shell (OYSTER CALCIUM) 500 MG TABS tablet, Take 500 mg of elemental calcium by mouth daily., Disp: , Rfl:  .  ranitidine (ZANTAC) 150 MG tablet, Take 150 mg by mouth 2 (two) times daily., Disp: , Rfl:  .  risperidone (RISPERDAL) 4 MG tablet, Take 4 mg by mouth at bedtime., Disp: , Rfl:  .  acetaminophen (TYLENOL) 500 MG tablet, Take 1,000 mg by  mouth every 6 (six) hours as needed. , Disp: , Rfl:   Physical exam:  Vitals:   05/13/18 1031  BP: 101/67  Pulse: 83  Resp: 18  Temp: 98 F (36.7 C)  TempSrc: Tympanic  SpO2: 98%  Weight: 208 lb 14.4 oz (94.8 kg)  Height: '6\' 1"'$  (1.854 m)   Physical Exam  Constitutional: He is oriented to person, place, and time. He appears well-developed and well-nourished.  HENT:  Head: Normocephalic and atraumatic.  Eyes: Pupils are equal, round, and reactive to light. EOM are normal.  Neck: Normal range of motion.  Cardiovascular: Normal rate, regular rhythm and normal heart sounds.  Pulmonary/Chest: Effort normal and breath sounds normal.  Abdominal: Soft. Bowel sounds are normal.  Musculoskeletal: He exhibits edema (trace b/l).  Neurological: He is alert and oriented to person, place, and time.  Skin: Skin is warm and dry.     CMP Latest Ref Rng & Units 05/07/2018  Glucose 65 - 99 mg/dL 149(H)  BUN 6 - 20 mg/dL 18  Creatinine 0.61 - 1.24 mg/dL 0.96  Sodium 135 - 145 mmol/L 138  Potassium 3.5 - 5.1 mmol/L 4.3  Chloride 101 - 111 mmol/L 103  CO2 22 - 32 mmol/L 26  Calcium 8.9 - 10.3 mg/dL 9.0  Total Protein 6.5 - 8.1 g/dL 7.7  Total Bilirubin 0.3 - 1.2 mg/dL 0.9  Alkaline Phos 38 - 126 U/L 60  AST 15 - 41 U/L 24  ALT 17 - 63 U/L 15(L)   CBC Latest Ref Rng & Units 05/13/2018  WBC 3.8 - 10.6 K/uL 6.7  Hemoglobin 13.0 - 18.0 g/dL 10.1(L)  Hematocrit 40.0 - 52.0 % 30.0(L)  Platelets 150 - 440 K/uL 201    No images are attached to the encounter.  Dg Chest Port 1 View  Result Date: 05/07/2018 CLINICAL DATA:  Altered mental status at lunch today his assisted living facility. History of COPD, atrial fibrillation, schizophrenia. EXAM: PORTABLE CHEST 1 VIEW COMPARISON:  Chest x-ray of December 08, 2017 FINDINGS: The lungs are adequately inflated. There is no focal infiltrate. There is no pleural effusion. The  heart and pulmonary vascularity are normal. The mediastinum is normal in width.  There is no acute bony abnormality. IMPRESSION: There is no active cardiopulmonary disease. Electronically Signed   By: David  [place]ique M.D.   On: 05/07/2018 14:00     Assessment and plan- Patient is a 62 y.o. male with following issues:  1. Chronic anemia- hb has remained stable around 10 since last 3 years. He had macrocytosis in the past which has resolved. Continue to monitor  2. Leukopenia- resolved. Continue to monitor. Flow cytometry done in the past was negative  3. Chronic thrombocytopenia- platelet counts are typically in the 100's-110's. Normal today  Repeat cbc with diff in 6 months and 1 year and I will see him back in 1 years time. He will have 6 months labs drwan at his group home and faxed to [place]   Visit Diagnosis 1. Macrocytic anemia   2. Other pancytopenia ([place])      Dr. Randa Evens, MD, MPH [facility] [phone] 05/13/2018 1:28 PM

## 2018-05-13 NOTE — Progress Notes (Signed)
No new changes noted today 

## 2018-05-14 ENCOUNTER — Ambulatory Visit: Payer: Medicare Other

## 2018-05-14 ENCOUNTER — Ambulatory Visit
Admission: RE | Admit: 2018-05-14 | Discharge: 2018-05-14 | Disposition: A | Discharge status: Home / Self Care | Payer: Medicare Other | Source: Ambulatory Visit | Attending: Vascular Surgery | Admitting: Vascular Surgery

## 2018-05-14 DIAGNOSIS — M79604 Pain in right leg: Secondary | ICD-10-CM | POA: Diagnosis not present

## 2018-05-14 DIAGNOSIS — M7989 Other specified soft tissue disorders: Secondary | ICD-10-CM | POA: Insufficient documentation

## 2018-05-14 DIAGNOSIS — M79605 Pain in left leg: Secondary | ICD-10-CM | POA: Diagnosis not present

## 2018-05-19 ENCOUNTER — Ambulatory Visit (INDEPENDENT_AMBULATORY_CARE_PROVIDER_SITE_OTHER): Payer: Medicare Other | Admitting: Vascular Surgery

## 2018-09-01 ENCOUNTER — Encounter (INDEPENDENT_AMBULATORY_CARE_PROVIDER_SITE_OTHER): Payer: Medicare Other

## 2018-09-01 ENCOUNTER — Ambulatory Visit (INDEPENDENT_AMBULATORY_CARE_PROVIDER_SITE_OTHER): Payer: Medicare Other | Admitting: Nurse Practitioner

## 2018-10-03 ENCOUNTER — Encounter (INDEPENDENT_AMBULATORY_CARE_PROVIDER_SITE_OTHER): Payer: Self-pay | Admitting: Vascular Surgery

## 2018-10-03 ENCOUNTER — Encounter (INDEPENDENT_AMBULATORY_CARE_PROVIDER_SITE_OTHER): Payer: Self-pay

## 2018-10-03 ENCOUNTER — Ambulatory Visit (INDEPENDENT_AMBULATORY_CARE_PROVIDER_SITE_OTHER): Payer: Medicare Other | Admitting: Vascular Surgery

## 2018-10-03 ENCOUNTER — Ambulatory Visit (INDEPENDENT_AMBULATORY_CARE_PROVIDER_SITE_OTHER): Payer: Medicare Other

## 2018-10-03 VITALS — BP 119/69 | HR 68 | Resp 17 | Ht 72.0 in | Wt 214.0 lb

## 2018-10-03 DIAGNOSIS — R600 Localized edema: Secondary | ICD-10-CM | POA: Diagnosis not present

## 2018-10-03 DIAGNOSIS — E119 Type 2 diabetes mellitus without complications: Secondary | ICD-10-CM | POA: Diagnosis not present

## 2018-10-03 DIAGNOSIS — I10 Essential (primary) hypertension: Secondary | ICD-10-CM | POA: Diagnosis not present

## 2018-10-03 DIAGNOSIS — M7989 Other specified soft tissue disorders: Secondary | ICD-10-CM

## 2018-10-03 NOTE — Patient Instructions (Signed)
Edema Edema is when you have too much fluid in your body or under your skin. Edema may make your legs, feet, and ankles swell up. Swelling is also common in looser tissues, like around your eyes. This is a common condition. It gets more common as you get older. There are many possible causes of edema. Eating too much salt (sodium) and being on your feet or sitting for a long time can cause edema in your legs, feet, and ankles. Hot weather may make edema worse. Edema is usually painless. Your skin may look swollen or shiny. Follow these instructions at home:  Keep the swollen body part raised (elevated) above the level of your heart when you are sitting or lying down.  Do not sit still or stand for a long time.  Do not wear tight clothes. Do not wear garters on your upper legs.  Exercise your legs. This can help the swelling go down.  Wear elastic bandages or support stockings as told by your doctor.  Eat a low-salt (low-sodium) diet to reduce fluid as told by your doctor.  Depending on the cause of your swelling, you may need to limit how much fluid you drink (fluid restriction).  Take over-the-counter and prescription medicines only as told by your doctor. Contact a doctor if:  Treatment is not working.  You have heart, liver, or kidney disease and have symptoms of edema.  You have sudden and unexplained weight gain. Get help right away if:  You have shortness of breath or chest pain.  You cannot breathe when you lie down.  You have pain, redness, or warmth in the swollen areas.  You have heart, liver, or kidney disease and get edema all of a sudden.  You have a fever and your symptoms get worse all of a sudden. Summary  Edema is when you have too much fluid in your body or under your skin.  Edema may make your legs, feet, and ankles swell up. Swelling is also common in looser tissues, like around your eyes.  Raise (elevate) the swollen body part above the level of your  heart when you are sitting or lying down.  Follow your doctor's instructions about diet and how much fluid you can drink (fluid restriction). This information is not intended to replace advice given to you by your health care provider. Make sure you discuss any questions you have with your health care provider. Document Released: 04/23/2008 Document Revised: 11/23/2016 Document Reviewed: 11/23/2016 Elsevier Interactive Patient Education  2017 [employer]  

## 2018-10-03 NOTE — Progress Notes (Signed)
MRN : [id number]  Brian Dodson is a 62 y.o. (1956-11-03) male who presents with chief complaint of  Chief Complaint  Patient presents with  . Follow-up    ABI and Reflux follow up  .  History of Present Illness: Patient returns today in follow up of leg swelling.  He has been wearing compression stockings which has done a good job of controlling his leg swelling.  He seems to be doing reasonably well otherwise.  He is in the assisted living facility now. The patient's venous reflux study showed no evidence of DVT or superficial thrombophlebitis.  Reflux was seen in the deep venous system but no reflux was seen in the superficial/saphenous vein systems.  Triphasic waveforms were seen in the arterial vessels in the lower leg as well.  Current Outpatient Medications  Medication Sig Dispense Refill  . acetaminophen (TYLENOL) 500 MG tablet Take 1,000 mg by mouth every 6 (six) hours as needed.     Marland Kitchen amiodarone (PACERONE) 200 MG tablet Take 0.5 tablets (100 mg total) by mouth daily. (Patient taking differently: Take 100 mg by mouth daily. )    . amLODipine (NORVASC) 5 MG tablet Take 5 mg by mouth daily.    Marland Kitchen aspirin 81 MG tablet Take 81 mg by mouth daily.     Marland Kitchen atenolol (TENORMIN) 25 MG tablet Take 25 mg by mouth daily.     Marland Kitchen atorvastatin (LIPITOR) 10 MG tablet Take 10 mg by mouth daily.    . benztropine (COGENTIN) 1 MG tablet Take 1 mg by mouth at bedtime.     . citalopram (CELEXA) 20 MG tablet Take 20 mg by mouth at bedtime.     . divalproex (DEPAKOTE ER) 500 MG 24 hr tablet Take 2,000 mg by mouth at bedtime.     . donepezil (ARICEPT) 10 MG tablet Take 10 mg by mouth at bedtime.     Marland Kitchen ELIQUIS 5 MG TABS tablet Take 5 mg by mouth 2 (two) times daily.    Marland Kitchen latanoprost (XALATAN) 0.005 % ophthalmic solution Place 1 drop into both eyes at bedtime.    . metFORMIN (GLUCOPHAGE) 500 MG tablet Take 500 mg by mouth 2 (two) times daily with a meal.    . Oyster Shell (OYSTER CALCIUM) 500 MG TABS tablet  Take 500 mg of elemental calcium by mouth daily.    . ranitidine (ZANTAC) 150 MG tablet Take 150 mg by mouth 2 (two) times daily.    . risperidone (RISPERDAL) 4 MG tablet Take 4 mg by mouth at bedtime.     No current facility-administered medications for this visit.     Past Medical History:  Diagnosis Date  . Asthma   . Atrial fibrillation (HCC)   . Benign prostatic hypertrophy with lower urinary tract symptoms (LUTS)   . Bulbous urethral stricture   . CAD (coronary artery disease)    s/p coronary stent 2003  . Constipation   . COPD (chronic obstructive pulmonary disease) (HCC)   . Diabetes mellitus without complication (HCC)   . Dizziness   . Dyspnea   . GERD (gastroesophageal reflux disease)   . Gross hematuria   . Hyperlipemia   . Hypertension   . Lumbago   . Palpitation   . Schizophrenia [facility])     Past Surgical History:  Procedure Laterality Date  . COLONOSCOPY    . CORONARY ANGIOPLASTY WITH STENT PLACEMENT  11/2003  . kidney stent      Social History Social History  Tobacco Use  . Smoking status: Former Games developer  . Smokeless tobacco: Never Used  Substance Use Topics  . Alcohol use: No  . Drug use: No    Family History Family History  Problem Relation Age of Onset  . Diabetes Unknown   . Bladder Cancer Neg Hx   . Kidney cancer Neg Hx   . Prostate cancer Neg Hx   no bleeding or clotting disorders  No Known Allergies  REVIEW OF SYSTEMS (Negative unless checked)  Constitutional: [] Weight loss  [] Fever  [] Chills Cardiac: [] Chest pain   [] Chest pressure   [x] Palpitations   [] Shortness of breath when laying flat   [] Shortness of breath at rest   [] Shortness of breath with exertion. Vascular:  [x] Pain in legs with walking   [x] Pain in legs at rest   [] Pain in legs when laying flat   [] Claudication   [] Pain in feet when walking  [] Pain in feet at rest  [] Pain in feet when laying flat   [] History of DVT   [] Phlebitis   [x] Swelling in legs   [] Varicose veins    [] Non-healing ulcers Pulmonary:   [] Uses home oxygen   [] Productive cough   [] Hemoptysis   [] Wheeze  [] COPD   [] Asthma Neurologic:  [x] Dizziness  [] Blackouts   [] Seizures   [] History of stroke   [] History of TIA  [] Aphasia   [] Temporary blindness   [] Dysphagia   [] Weakness or numbness in arms   [] Weakness or numbness in legs Musculoskeletal:  [] Arthritis   [] Joint swelling   [] Joint pain   [] Low back pain Hematologic:  [] Easy bruising  [] Easy bleeding   [] Hypercoagulable state   [] Anemic  [] Hepatitis Gastrointestinal:  [] Blood in stool   [] Vomiting blood  [x] Gastroesophageal reflux/heartburn   [] Abdominal pain Genitourinary:  [] Chronic kidney disease   [] Difficult urination  [] Frequent urination  [] Burning with urination   [] Hematuria Skin:  [] Rashes   [] Ulcers   [] Wounds Psychological:  [x] History of anxiety   [x]  History of major depression.   Physical Examination  BP 119/69 (BP Location: Right Arm, Patient Position: Sitting)   Pulse 68   Resp 17   Ht 6' (1.829 m)   Wt 214 lb (97.1 kg)   BMI 29.02 kg/m  Gen:  WD/WN, NAD Head: [place]/AT, No temporalis wasting. Ear/Nose/Throat: Hearing grossly intact, nares w/o erythema or drainage Eyes: Conjunctiva clear. Sclera non-icteric Neck: Supple.  Trachea midline Pulmonary:  Good air movement, no use of accessory muscles.  Cardiac: RRR, no JVD Vascular:  Vessel Right Left  Radial Palpable Palpable                          PT Palpable 1+ Palpable  DP Palpable 1+ Palpable   Gastrointestinal: soft, non-tender/non-distended. No guarding/reflex.  Musculoskeletal: M/S 5/5 throughout.  No deformity or atrophy. No right leg edema, 1+ Left leg edema. Neurologic: Sensation grossly intact in extremities.  Symmetrical.  Speech is fluent.  Psychiatric: Judgment and insight are poor Dermatologic: No rashes or ulcers noted.  No cellulitis or open wounds.       Labs No results found for this or any previous visit (from the past 2160  hour(s)).  Radiology No results found.  Assessment/Plan HTN (hypertension) blood pressure control important in reducing the progression of atherosclerotic disease. On appropriate oral medications.   Diabetes mellitus, type II blood glucose control important in reducing the progression of atherosclerotic disease. Also, involved in wound healing. On appropriate medications.  Swelling of limb The  patient's venous reflux study showed no evidence of DVT or superficial thrombophlebitis.  Reflux was seen in the deep venous system but no reflux was seen in the superficial/saphenous vein systems.  Triphasic waveforms were seen in the arterial vessels in the lower leg as well.  No role for intervention.  Compression stockings are doing a good job of keeping the swelling under control.  He should wear these most every day.  At this point, we will see him back on an as-needed basis.    Festus BarrenJason Dew, MD  10/03/2018 10:38 AM    This note was created with Dragon medical transcription system.  Any errors from dictation are purely unintentional

## 2018-10-03 NOTE — Assessment & Plan Note (Signed)
The patient's venous reflux study showed no evidence of DVT or superficial thrombophlebitis.  Reflux was seen in the deep venous system but no reflux was seen in the superficial/saphenous vein systems.  Triphasic waveforms were seen in the arterial vessels in the lower leg as well.  No role for intervention.  Compression stockings are doing a good job of keeping the swelling under control.  He should wear these most every day.  At this point, we will see him back on an as-needed basis.

## 2018-10-08 ENCOUNTER — Encounter (INDEPENDENT_AMBULATORY_CARE_PROVIDER_SITE_OTHER): Payer: Medicare Other

## 2018-10-08 ENCOUNTER — Ambulatory Visit (INDEPENDENT_AMBULATORY_CARE_PROVIDER_SITE_OTHER): Payer: Medicare Other | Admitting: Vascular Surgery

## 2018-10-08 ENCOUNTER — Encounter

## 2018-11-18 ENCOUNTER — Emergency Department: Payer: Medicare Other

## 2018-11-18 ENCOUNTER — Emergency Department
Admission: EM | Admit: 2018-11-18 | Discharge: 2018-11-18 | Disposition: A | Discharge status: Home / Self Care | Payer: Medicare Other | Attending: Emergency Medicine | Admitting: Emergency Medicine

## 2018-11-18 ENCOUNTER — Other Ambulatory Visit: Payer: Self-pay

## 2018-11-18 ENCOUNTER — Encounter: Payer: Self-pay | Admitting: Emergency Medicine

## 2018-11-18 DIAGNOSIS — E119 Type 2 diabetes mellitus without complications: Secondary | ICD-10-CM | POA: Diagnosis not present

## 2018-11-18 DIAGNOSIS — I10 Essential (primary) hypertension: Secondary | ICD-10-CM | POA: Insufficient documentation

## 2018-11-18 DIAGNOSIS — N50819 Testicular pain, unspecified: Secondary | ICD-10-CM | POA: Diagnosis present

## 2018-11-18 DIAGNOSIS — N433 Hydrocele, unspecified: Secondary | ICD-10-CM

## 2018-11-18 DIAGNOSIS — R001 Bradycardia, unspecified: Secondary | ICD-10-CM | POA: Diagnosis not present

## 2018-11-18 DIAGNOSIS — Z7901 Long term (current) use of anticoagulants: Secondary | ICD-10-CM | POA: Diagnosis not present

## 2018-11-18 DIAGNOSIS — N5089 Other specified disorders of the male genital organs: Secondary | ICD-10-CM | POA: Diagnosis not present

## 2018-11-18 DIAGNOSIS — Z87891 Personal history of nicotine dependence: Secondary | ICD-10-CM | POA: Insufficient documentation

## 2018-11-18 DIAGNOSIS — J449 Chronic obstructive pulmonary disease, unspecified: Secondary | ICD-10-CM | POA: Insufficient documentation

## 2018-11-18 DIAGNOSIS — I2510 Atherosclerotic heart disease of native coronary artery without angina pectoris: Secondary | ICD-10-CM | POA: Insufficient documentation

## 2018-11-18 DIAGNOSIS — Z79899 Other long term (current) drug therapy: Secondary | ICD-10-CM | POA: Diagnosis not present

## 2018-11-18 DIAGNOSIS — R609 Edema, unspecified: Secondary | ICD-10-CM | POA: Diagnosis not present

## 2018-11-18 LAB — URINALYSIS, COMPLETE (UACMP) WITH MICROSCOPIC
Bilirubin Urine: NEGATIVE
Glucose, UA: NEGATIVE mg/dL
Hgb urine dipstick: NEGATIVE
Ketones, ur: NEGATIVE mg/dL
Leukocytes, UA: NEGATIVE
Nitrite: POSITIVE — AB
Protein, ur: NEGATIVE mg/dL
Specific Gravity, Urine: 1.019 (ref 1.005–1.030)
pH: 7 (ref 5.0–8.0)

## 2018-11-18 LAB — COMPREHENSIVE METABOLIC PANEL
ALT: 11 U/L (ref 0–44)
AST: 21 U/L (ref 15–41)
Albumin: 3.1 g/dL — ABNORMAL LOW (ref 3.5–5.0)
Alkaline Phosphatase: 46 U/L (ref 38–126)
Anion gap: 5 (ref 5–15)
BUN: 21 mg/dL (ref 8–23)
CO2: 27 mmol/L (ref 22–32)
Calcium: 8.6 mg/dL — ABNORMAL LOW (ref 8.9–10.3)
Chloride: 107 mmol/L (ref 98–111)
Creatinine, Ser: 1.07 mg/dL (ref 0.61–1.24)
GFR calc Af Amer: >60 mL/min (ref >60)
GFR calc non Af Amer: >60 mL/min (ref >60)
Glucose, Bld: 66 mg/dL — ABNORMAL LOW (ref 70–99)
Potassium: 4.4 mmol/L (ref 3.5–5.1)
Sodium: 139 mmol/L (ref 135–145)
Total Bilirubin: 0.4 mg/dL (ref 0.3–1.2)
Total Protein: 6.4 g/dL — ABNORMAL LOW (ref 6.5–8.1)

## 2018-11-18 LAB — CBC WITH DIFFERENTIAL/PLATELET
Abs Immature Granulocytes: 0.09 10*3/uL — ABNORMAL HIGH (ref 0.00–0.07)
Basophils Absolute: 0 10*3/uL (ref 0.0–0.1)
Basophils Relative: 0 %
Eosinophils Absolute: 0.2 10*3/uL (ref 0.0–0.5)
Eosinophils Relative: 3 %
HCT: 30.4 % — ABNORMAL LOW (ref 39.0–52.0)
Hemoglobin: 9.5 g/dL — ABNORMAL LOW (ref 13.0–17.0)
Immature Granulocytes: 2 %
Lymphocytes Relative: 41 %
Lymphs Abs: 2.4 10*3/uL (ref 0.7–4.0)
MCH: 31.4 pg (ref 26.0–34.0)
MCHC: 31.3 g/dL (ref 30.0–36.0)
MCV: 100.3 fL — ABNORMAL HIGH (ref 80.0–100.0)
Monocytes Absolute: 0.7 10*3/uL (ref 0.1–1.0)
Monocytes Relative: 11 %
Neutro Abs: 2.6 10*3/uL (ref 1.7–7.7)
Neutrophils Relative %: 43 %
Platelets: 165 10*3/uL (ref 150–400)
RBC: 3.03 MIL/uL — ABNORMAL LOW (ref 4.22–5.81)
RDW: 15.3 % (ref 11.5–15.5)
WBC: 5.9 10*3/uL (ref 4.0–10.5)
nRBC: 0 % (ref 0.0–0.2)

## 2018-11-18 LAB — TROPONIN I: Troponin I: <0.03 ng/mL (ref <0.03)

## 2018-11-18 MED ORDER — LOSARTAN POTASSIUM 25 MG PO TABS: 25.0000 mg | ORAL_TABLET | Freq: Every day | ORAL | 11 refills | Status: DC

## 2018-11-18 NOTE — ED Provider Notes (Addendum)
[facility] Emergency Department Provider Note   ____________________________________________   First MD Initiated Contact with Patient 11/18/18 1123     (approximate)  I have reviewed the triage vital signs and the nursing notes.   HISTORY  Chief Complaint Testicle Pain    HPI Brian Dodson is a 62 y.o. male who complains of intermittent testicle pain for the last 3 weeks.  It comes and goes.  Came on today while he was doing jumping jacks and has now gone.  He is not had this before.  He has no other complaints of pain dysuria or anything else.  Pain is in his right testicle.  Past Medical History:  Diagnosis Date  . Asthma   . Atrial fibrillation (HCC)   . Benign prostatic hypertrophy with lower urinary tract symptoms (LUTS)   . Bulbous urethral stricture   . CAD (coronary artery disease)    s/p coronary stent 2003  . Constipation   . COPD (chronic obstructive pulmonary disease) (HCC)   . Diabetes mellitus without complication (HCC)   . Dizziness   . Dyspnea   . GERD (gastroesophageal reflux disease)   . Gross hematuria   . Hyperlipemia   . Hypertension   . Lumbago   . Palpitation   . Schizophrenia [facility](HCC)     Patient Active Problem List   Diagnosis Date Noted  . Bilateral lower extremity edema 04/01/2018  . Lower extremity pain, bilateral 04/01/2018  . Swelling of limb 10/04/2017  . Sepsis (HCC) 05/20/2017  . Atrial fibrillation with RVR (HCC) 05/23/2015  . Syncope 05/23/2015  . Schizophrenia (HCC) 05/23/2015  . Diabetes mellitus, type II (HCC) 05/23/2015  . HTN (hypertension) 05/23/2015  . GERD (gastroesophageal reflux disease) 05/23/2015  . CAD (coronary artery disease) 05/23/2015    Past Surgical History:  Procedure Laterality Date  . COLONOSCOPY    . CORONARY ANGIOPLASTY WITH STENT PLACEMENT  11/2003  . kidney stent      Prior to Admission medications   Medication Sig Start Date End Date Taking? Authorizing Provider    acetaminophen (TYLENOL) 500 MG tablet Take 1,000 mg by mouth every 6 (six) hours as needed.     [provider]  amiodarone (PACERONE) 200 MG tablet Take 0.5 tablets (100 mg total) by mouth daily. Patient taking differently: Take 100 mg by mouth daily.  05/23/17   Houston SirenSainani, Vivek J, MD  amLODipine (NORVASC) 5 MG tablet Take 5 mg by mouth daily.    [provider]  aspirin 81 MG tablet Take 81 mg by mouth daily.     [provider]  atenolol (TENORMIN) 25 MG tablet Take 25 mg by mouth daily.     [provider]  atorvastatin (LIPITOR) 10 MG tablet Take 10 mg by mouth daily. 05/18/15   [provider]  benztropine (COGENTIN) 1 MG tablet Take 1 mg by mouth at bedtime.     [provider]  citalopram (CELEXA) 20 MG tablet Take 20 mg by mouth at bedtime.     [provider]  divalproex (DEPAKOTE ER) 500 MG 24 hr tablet Take 2,000 mg by mouth at bedtime.     [provider]  donepezil (ARICEPT) 10 MG tablet Take 10 mg by mouth at bedtime.     [provider]  ELIQUIS 5 MG TABS tablet Take 5 mg by mouth 2 (two) times daily. 05/18/15   [provider]  latanoprost (XALATAN) 0.005 % ophthalmic solution Place 1 drop into  both eyes at bedtime.    [provider]  metFORMIN (GLUCOPHAGE) 500 MG tablet Take 500 mg by mouth 2 (two) times daily with a meal.    [provider]  [name] (OYSTER CALCIUM) 500 MG TABS tablet Take 500 mg of elemental calcium by mouth daily.    [provider]  ranitidine (ZANTAC) 150 MG tablet Take 150 mg by mouth 2 (two) times daily.    [provider]  risperidone (RISPERDAL) 4 MG tablet Take 4 mg by mouth at bedtime. 05/18/15   [provider]    Allergies Patient has no known allergies.  Family History  Problem Relation Age of Onset  . Diabetes Other   . Bladder Cancer Neg Hx   . Kidney cancer Neg Hx   . Prostate cancer Neg Hx     Social  History Social History   Tobacco Use  . Smoking status: Former Games developer  . Smokeless tobacco: Never Used  Substance Use Topics  . Alcohol use: No  . Drug use: No    Review of Systems  Constitutional: No fever/chills Eyes: No visual changes. ENT: No sore throat. Cardiovascular: Denies chest pain. Respiratory: Denies shortness of breath. Gastrointestinal: No abdominal pain.  No nausea, no vomiting.  No diarrhea.  No constipation. Genitourinary: Negative for dysuria. Musculoskeletal: Negative for back pain. Skin: Negative for rash. Neurological: Negative for headaches, focal weakness  ____________________________________________   PHYSICAL EXAM:  VITAL SIGNS: ED Triage Vitals  Enc Vitals Group     BP 11/18/18 1112 (!) 116/56     Pulse Rate 11/18/18 1112 (!) 45     Resp 11/18/18 1112 18     Temp 11/18/18 1112 98.7 F (37.1 C)     Temp Source 11/18/18 1112 Oral     SpO2 11/18/18 1112 95 %     Weight 11/18/18 1103 210 lb (95.3 kg)     Height 11/18/18 1103 6\' 3"  (1.905 m)     Head Circumference --      Peak Flow --      Pain Score 11/18/18 1102 0     Pain Loc --      Pain Edu? --      Excl. in GC? --     Constitutional: Alert and oriented. Well appearing and in no acute distress. Eyes: Conjunctivae are normal. Head: Atraumatic. Nose: No congestion/rhinnorhea. Mouth/Throat: Mucous membranes are moist.  Oropharynx non-erythematous. Neck: No stridor.  Cardiovascular: Normal rate, regular rhythm. Grossly normal heart sounds.  Good peripheral circulation. Respiratory: Normal respiratory effort.  No retractions. Lungs CTAB. Gastrointestinal: Soft and nontender. No distention. No abdominal bruits. No CVA tenderness. Genitourinary: Normal male penis normal-sized left testicle right testicle is firm and enlarged larger than a golf ball not tender currently Musculoskeletal: No lower extremity tenderness lateral 1-2+ edema.  Neurologic:  Normal speech and language. Skin:  Skin  is warm, dry and intact. No rash noted. Psychiatric: Mood and affect are normal. Speech and behavior are normal.  ____________________________________________   LABS (all labs ordered are listed, but only abnormal results are displayed)  Labs Reviewed  COMPREHENSIVE METABOLIC PANEL - Abnormal; Notable for the following components:      Result Value   Glucose, Bld 66 (*)    Calcium 8.6 (*)    Total Protein 6.4 (*)    Albumin 3.1 (*)    All other components within normal limits  URINALYSIS, COMPLETE (UACMP) WITH MICROSCOPIC - Abnormal; Notable for the following components:   Color,  Urine YELLOW (*)    APPearance CLEAR (*)    Nitrite POSITIVE (*)    Bacteria, UA FEW (*)    All other components within normal limits  CBC WITH DIFFERENTIAL/PLATELET - Abnormal; Notable for the following components:   RBC 3.03 (*)    Hemoglobin 9.5 (*)    HCT 30.4 (*)    MCV 100.3 (*)    Abs Immature Granulocytes 0.09 (*)    All other components within normal limits  TROPONIN I   ____________________________________________  EKG   ____________________________________________  RADIOLOGY  ED MD interpretation: X-ray shows no acute disease per radiology I reviewed the film  Official radiology report(s): Dg Chest 2 View  Result Date: 11/18/2018 CLINICAL DATA:  Right testicular pain, swelling EXAM: CHEST - 2 VIEW COMPARISON:  05/07/2018 FINDINGS: Heart and mediastinal contours are within normal limits. No focal opacities or effusions. No acute bony abnormality. IMPRESSION: No active cardiopulmonary disease. Electronically Signed   By: Charlett NoseKevin  Dover M.D.   On: 11/18/2018 12:26   [place] Scrotum W/doppler  Result Date: 11/18/2018 CLINICAL DATA:  Right testicular pain after increasing exercise last week. EXAM: SCROTAL ULTRASOUND DOPPLER ULTRASOUND OF THE TESTICLES TECHNIQUE: Complete ultrasound examination of the testicles, epididymis, and other scrotal structures was performed. Color and spectral  Doppler ultrasound were also utilized to evaluate blood flow to the testicles. COMPARISON:  None. FINDINGS: Right testicle Measurements: 45 x 29 x 21 mm. Striated appearance that is symmetric to the left. Given symmetry in the setting of unilateral symptoms, and normal vascularity, this is likely from interstitial fibrosis rather than edema. Left testicle Measurements: 45 x 23 x 26 mm. Striated appearance as seen on the right. No mass or abnormal vascularity. Right epididymis:  Normal in size and appearance. Left epididymis:  Normal in size and appearance. Hydrocele: Moderate hydrocele seen on both sides with simple appearance Varicocele:  None visualized. Pulsed Doppler interrogation of both testes demonstrates normal low resistance arterial and venous waveforms bilaterally. IMPRESSION: Bilateral moderate hydrocele. Electronically Signed   By: Marnee SpringJonathon  Watts M.D.   On: 11/18/2018 13:06    ____________________________________________   PROCEDURES  Procedure(s) performed:   Procedures  Critical Care performed:   ____________________________________________   INITIAL IMPRESSION / ASSESSMENT AND PLAN / ED COURSE Discussed patient's bradycardia and edema with Dr. Juliann Paresallwood.  He wants me to skip the atenolol for day then decrease it to 12-1/2 a day given Lasix 20 mg a day for 3 days switch him to losartan 50 mg a day and DC amlodipine patient's blood pressure still fairly low we will start him on losartan 25 and then increase it after several days..  I will also have him follow-up with urologist.  Patient has seen other cardiologist was Dr. Juliann Paresallwood was the last one that I can find to saw him in the chart.          ____________________________________________   FINAL CLINICAL IMPRESSION(S) / ED DIAGNOSES  Final diagnoses:  Testicular swelling, right  Hydrocele in adult  Bradycardia  Edema, unspecified type     ED Discharge Orders    None       Note:  This document was  prepared using Dragon voice recognition software and may include unintentional dictation errors.    Arnaldo NatalMalinda, Paul F, MD 11/18/18 1344    Arnaldo NatalMalinda, Paul F, MD 11/18/18 1346

## 2018-11-18 NOTE — ED Notes (Signed)
Patient back from U/S family at bedside. Awaiting results and disposition.

## 2018-11-18 NOTE — ED Triage Notes (Signed)
Pt presents to ED via [facility] with c/o R testicle pain x 3 weeks after doing jumping jacks. Pt from [facility]   40HR  96% RA 119/68 CBG 72

## 2018-11-18 NOTE — ED Notes (Signed)
Patient off unit to xray, will go to u/s afterwards.

## 2018-11-18 NOTE — Discharge Instructions (Signed)
The ultrasound of your testes looks normal.  There is no sign of any twisting of the testicle or infection or tumor.  One is larger than the other however and there is some fluid around both of them which can be normal.  I want you to follow-up with the urologist.  Please give Dr. Delana MeyerBrandon's office a call today or tomorrow she should be out of see you within a week or so.  Please return here if your testicles begin hurting a lot and do not get better within about 15 minutes.  Your heart rate is low and you have some swelling.  I discussed your medications with Dr.Callwood, 1 of your cardiologist.s  He wants you to skip the atenolol for day and then decrease it to 12-1/2 mg a day.  He wants to give you Lasix 20 mg a day for 3 days and then stop the Lasix he wants you to stop the amlodipine and switch to losartan 25 milligrams a day.  Increase to 50 mg a day if your blood pressure goes above 140/90 .  He wants you to call his office and schedule follow-up later on this week or early next week

## 2018-11-18 NOTE — ED Notes (Addendum)
Dr. Darnelle CatalanMalinda to speak with family member here to pick patient up. Patient discharged to home in the care of his niece.

## 2018-11-18 NOTE — ED Notes (Signed)
Patient is still off unit will assess upon return.

## 2018-11-18 NOTE — ED Notes (Signed)
Patient reports increased exercise last week and began to have right testicular pain then. Reports hardening and pain 7/10. Md to eval. ekg completed. Labs drawn and sent. Awaiting u/s.

## 2018-11-21 ENCOUNTER — Ambulatory Visit (INDEPENDENT_AMBULATORY_CARE_PROVIDER_SITE_OTHER): Payer: Medicare Other | Admitting: Urology

## 2018-11-21 VITALS — BP 127/77 | HR 98 | Wt 234.0 lb

## 2018-11-21 DIAGNOSIS — N432 Other hydrocele: Secondary | ICD-10-CM | POA: Diagnosis not present

## 2018-11-21 NOTE — Progress Notes (Signed)
   11/21/2018 3:04 PM   Brian Dodson 1956/05/29 [id number]  Reason for visit: Hydrocele  HPI: I saw Brian Dodson in clinic for discussion of the bilateral hydrocele and scrotal pain.  He is a 63 year old African-American male that lives in a group home with extensive psychiatric history.  He was previously followed by Dr. Apolinar Junes and Dr. Marlou Porch in our clinic for urethral stricture status post DVIU.  He denies any voiding complaints.  He is here today to discuss a 1 day history of scrotal pain that occurred on 11/18/2018 while doing jumping jacks and exercising.  He had an ultrasound performed at that time that showed bilateral hydroceles but normal testicles.  His pain is since completely resolved and he denies any complaints today.  He denies any urinary leakage or hematuria.  He denies fevers or chills.  There are no aggravating or alleviating factors.  He is also on Eliquis for cardiac history.  ROS: Please see flowsheet from today's date for complete review of systems.  Physical Exam: BP 127/77   Pulse 98   Wt 234 lb (106.1 kg)   BMI 29.25 kg/m    Constitutional:  Alert and oriented, No acute distress. Respiratory: Normal respiratory effort, no increased work of breathing. GI: Abdomen is soft, nontender, nondistended, no abdominal masses GU: No CVA tenderness, phallus without lesions, widely patent meatus, right > left hydrocele, non-tender, no erythema Skin: No rashes, bruises or suspicious lesions. Neurologic: Grossly intact, no focal deficits, moving all 4 extremities. Psychiatric: Delayed affect  Laboratory Data: Reviewed  Pertinent Imaging: I personally reviewed the scrotal ultrasound, bilateral hydroceles right larger than left  Assessment & Plan:   In summary, Brian Dodson is a 63 year old male with extensive psych history who lives in a group home who had an acute episode of scrotal pain secondary to exercise that has since completely resolved.  Ultrasound at that time  showed benign-appearing bilateral hydroceles, which is confirmed with his physical exam today.  We discussed strategies if his pain returns including snug fitting underwear, icing as needed, and anti-inflammatories.  Follow-up as needed  Sondra Come, MD  [facility] [address] [place], [place] [address] [phone]

## 2018-12-17 IMAGING — DX DG CHEST 1V PORT
1 series · 1 of 1 positions shown · non-contrast
Comparison: 05/23/2015.

CLINICAL DATA: Increased fever with altered mental status and
hypoxemia. Unresponsive.

EXAM:
PORTABLE CHEST 1 VIEW

[chest ap]
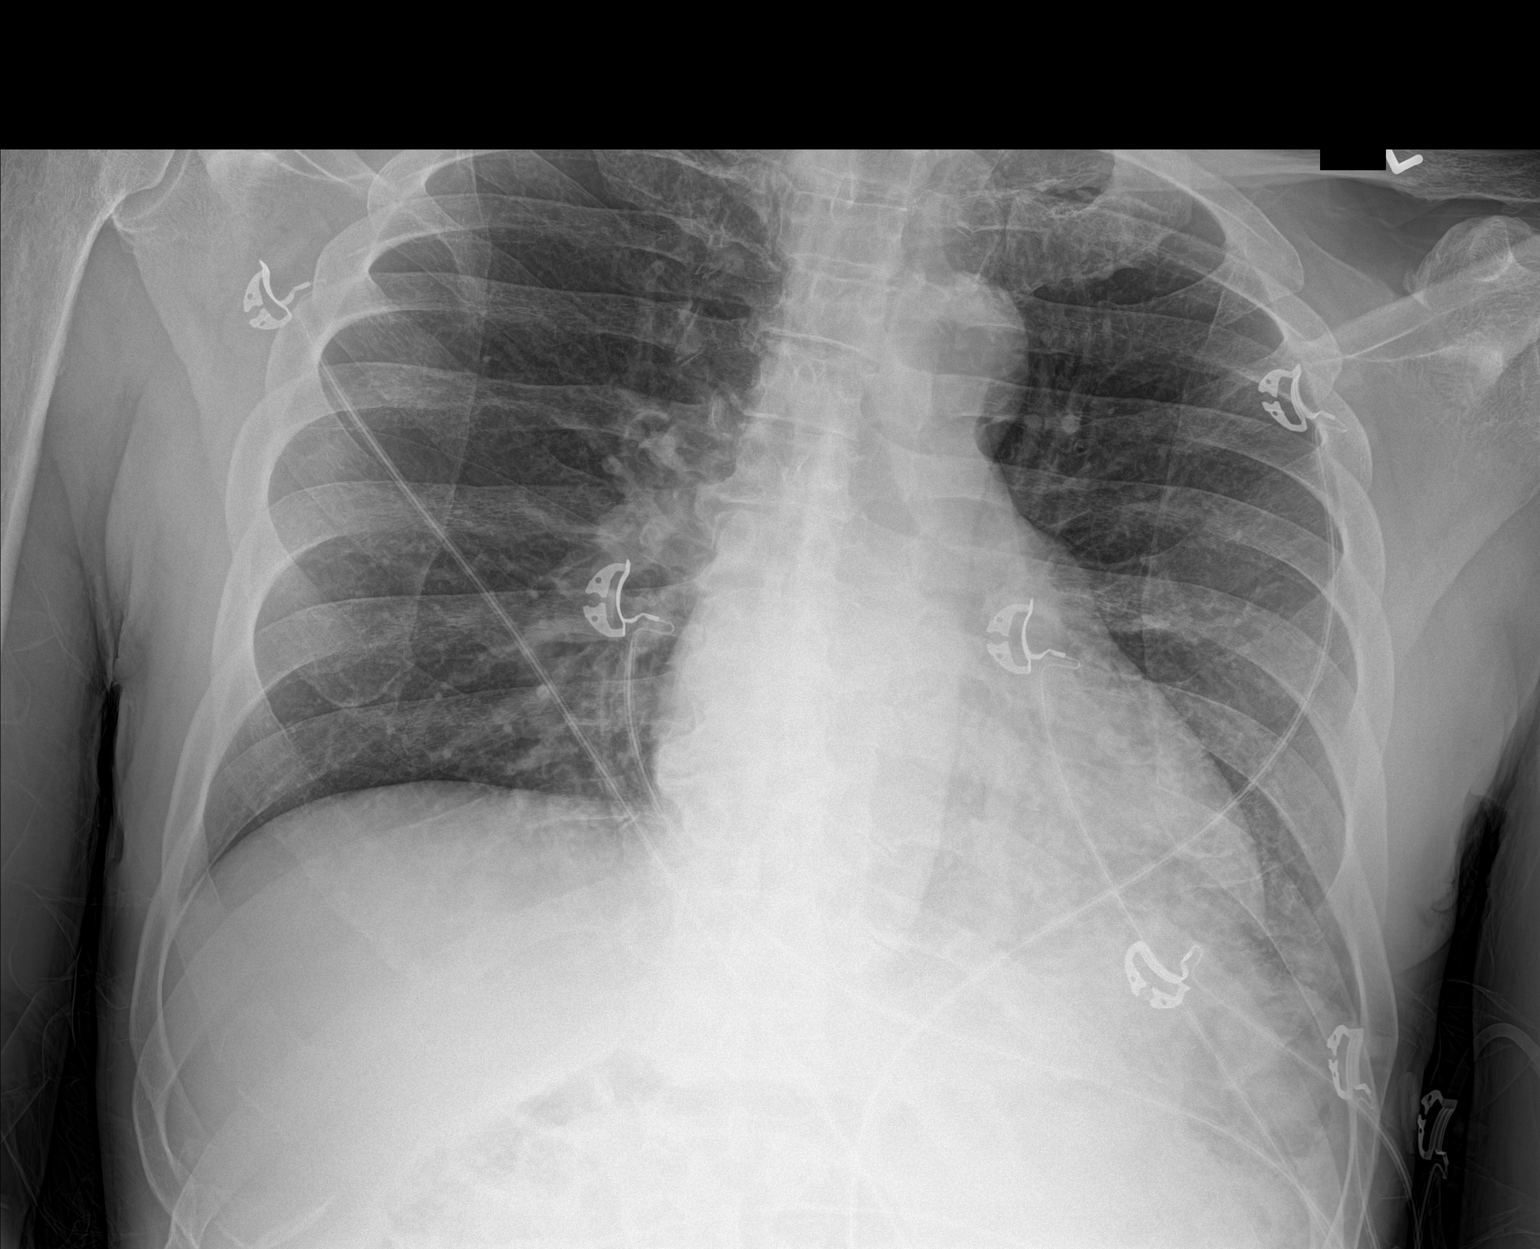

[1 of 1 positions shown; findings below may reference images not displayed]

FINDINGS: 0268 hour. Lordotic positioning and mild patient rotation to the
left. The heart size and mediastinal contours are stable. There is
poor definition of the left hemidiaphragm with patchy left basilar
pulmonary opacity. The right lung appears clear. There is no pleural
effusion or pneumothorax.
IMPRESSION: New patchy left basilar opacity, partly obscuring the left
hemidiaphragm. This could reflect atelectasis or aspiration.

## 2019-05-12 ENCOUNTER — Inpatient Hospital Stay: Payer: Medicare Other

## 2019-05-12 ENCOUNTER — Inpatient Hospital Stay: Payer: Medicare Other | Attending: Oncology | Admitting: Oncology

## 2019-07-15 ENCOUNTER — Telehealth: Payer: Self-pay | Admitting: Oncology

## 2019-07-15 NOTE — Telephone Encounter (Signed)
Pt's assisted living said they are not taking anyone out of the facility and they would call after COVID to R/S

## 2021-03-02 ENCOUNTER — Emergency Department (HOSPITAL_COMMUNITY): Payer: Medicare Other

## 2021-03-02 ENCOUNTER — Other Ambulatory Visit: Payer: Self-pay

## 2021-03-02 ENCOUNTER — Inpatient Hospital Stay (HOSPITAL_COMMUNITY)
Admission: EM | Admit: 2021-03-02 | Discharge: 2021-03-05 | DRG: 871 | Disposition: A | Discharge status: Nursing Home (Includes ICF) | Payer: Medicare Other | Source: Skilled Nursing Facility | Attending: Internal Medicine | Admitting: Internal Medicine

## 2021-03-02 ENCOUNTER — Encounter (HOSPITAL_COMMUNITY): Payer: Self-pay

## 2021-03-02 ENCOUNTER — Observation Stay (HOSPITAL_COMMUNITY): Payer: Medicare Other

## 2021-03-02 DIAGNOSIS — R4781 Slurred speech: Secondary | ICD-10-CM | POA: Diagnosis not present

## 2021-03-02 DIAGNOSIS — K219 Gastro-esophageal reflux disease without esophagitis: Secondary | ICD-10-CM | POA: Diagnosis present

## 2021-03-02 DIAGNOSIS — A419 Sepsis, unspecified organism: Secondary | ICD-10-CM | POA: Diagnosis not present

## 2021-03-02 DIAGNOSIS — R531 Weakness: Secondary | ICD-10-CM

## 2021-03-02 DIAGNOSIS — Z20822 Contact with and (suspected) exposure to covid-19: Secondary | ICD-10-CM | POA: Diagnosis present

## 2021-03-02 DIAGNOSIS — I10 Essential (primary) hypertension: Secondary | ICD-10-CM | POA: Diagnosis present

## 2021-03-02 DIAGNOSIS — Z7982 Long term (current) use of aspirin: Secondary | ICD-10-CM

## 2021-03-02 DIAGNOSIS — Z79899 Other long term (current) drug therapy: Secondary | ICD-10-CM

## 2021-03-02 DIAGNOSIS — F0390 Unspecified dementia without behavioral disturbance: Secondary | ICD-10-CM | POA: Diagnosis present

## 2021-03-02 DIAGNOSIS — R29703 NIHSS score 3: Secondary | ICD-10-CM | POA: Diagnosis present

## 2021-03-02 DIAGNOSIS — G9341 Metabolic encephalopathy: Secondary | ICD-10-CM | POA: Diagnosis present

## 2021-03-02 DIAGNOSIS — D638 Anemia in other chronic diseases classified elsewhere: Secondary | ICD-10-CM | POA: Diagnosis present

## 2021-03-02 DIAGNOSIS — E669 Obesity, unspecified: Secondary | ICD-10-CM | POA: Diagnosis present

## 2021-03-02 DIAGNOSIS — I4891 Unspecified atrial fibrillation: Secondary | ICD-10-CM | POA: Diagnosis present

## 2021-03-02 DIAGNOSIS — Z7984 Long term (current) use of oral hypoglycemic drugs: Secondary | ICD-10-CM

## 2021-03-02 DIAGNOSIS — E119 Type 2 diabetes mellitus without complications: Secondary | ICD-10-CM

## 2021-03-02 DIAGNOSIS — E1151 Type 2 diabetes mellitus with diabetic peripheral angiopathy without gangrene: Secondary | ICD-10-CM | POA: Diagnosis present

## 2021-03-02 DIAGNOSIS — N401 Enlarged prostate with lower urinary tract symptoms: Secondary | ICD-10-CM | POA: Diagnosis present

## 2021-03-02 DIAGNOSIS — N390 Urinary tract infection, site not specified: Secondary | ICD-10-CM | POA: Diagnosis not present

## 2021-03-02 DIAGNOSIS — Z6829 Body mass index (BMI) 29.0-29.9, adult: Secondary | ICD-10-CM

## 2021-03-02 DIAGNOSIS — E785 Hyperlipidemia, unspecified: Secondary | ICD-10-CM | POA: Diagnosis present

## 2021-03-02 DIAGNOSIS — Z7901 Long term (current) use of anticoagulants: Secondary | ICD-10-CM

## 2021-03-02 DIAGNOSIS — I4892 Unspecified atrial flutter: Secondary | ICD-10-CM | POA: Diagnosis present

## 2021-03-02 DIAGNOSIS — I2510 Atherosclerotic heart disease of native coronary artery without angina pectoris: Secondary | ICD-10-CM | POA: Diagnosis present

## 2021-03-02 DIAGNOSIS — G9340 Encephalopathy, unspecified: Secondary | ICD-10-CM | POA: Diagnosis not present

## 2021-03-02 DIAGNOSIS — R4701 Aphasia: Secondary | ICD-10-CM | POA: Diagnosis present

## 2021-03-02 DIAGNOSIS — R509 Fever, unspecified: Secondary | ICD-10-CM | POA: Diagnosis not present

## 2021-03-02 DIAGNOSIS — R413 Other amnesia: Secondary | ICD-10-CM | POA: Diagnosis present

## 2021-03-02 DIAGNOSIS — F209 Schizophrenia, unspecified: Secondary | ICD-10-CM | POA: Diagnosis present

## 2021-03-02 DIAGNOSIS — I480 Paroxysmal atrial fibrillation: Secondary | ICD-10-CM | POA: Diagnosis present

## 2021-03-02 DIAGNOSIS — Z1389 Encounter for screening for other disorder: Secondary | ICD-10-CM

## 2021-03-02 DIAGNOSIS — Z955 Presence of coronary angioplasty implant and graft: Secondary | ICD-10-CM

## 2021-03-02 DIAGNOSIS — J449 Chronic obstructive pulmonary disease, unspecified: Secondary | ICD-10-CM | POA: Diagnosis present

## 2021-03-02 DIAGNOSIS — D649 Anemia, unspecified: Secondary | ICD-10-CM | POA: Diagnosis present

## 2021-03-02 DIAGNOSIS — Z8673 Personal history of transient ischemic attack (TIA), and cerebral infarction without residual deficits: Secondary | ICD-10-CM

## 2021-03-02 DIAGNOSIS — Z87891 Personal history of nicotine dependence: Secondary | ICD-10-CM

## 2021-03-02 LAB — COMPREHENSIVE METABOLIC PANEL
ALT: 12 U/L (ref 0–44)
AST: 22 U/L (ref 15–41)
Albumin: 3.6 g/dL (ref 3.5–5.0)
Alkaline Phosphatase: 53 U/L (ref 38–126)
Anion gap: 9 (ref 5–15)
BUN: 17 mg/dL (ref 8–23)
CO2: 25 mmol/L (ref 22–32)
Calcium: 9.1 mg/dL (ref 8.9–10.3)
Chloride: 101 mmol/L (ref 98–111)
Creatinine, Ser: 1.17 mg/dL (ref 0.61–1.24)
GFR, Estimated: >60 mL/min (ref >60)
Glucose, Bld: 105 mg/dL — ABNORMAL HIGH (ref 70–99)
Potassium: 4.7 mmol/L (ref 3.5–5.1)
Sodium: 135 mmol/L (ref 135–145)
Total Bilirubin: 0.3 mg/dL (ref 0.3–1.2)
Total Protein: 6.7 g/dL (ref 6.5–8.1)

## 2021-03-02 LAB — LIPID PANEL
Cholesterol: 150 mg/dL (ref 0–200)
HDL: 64 mg/dL (ref >40)
LDL Cholesterol: 80 mg/dL (ref 0–99)
Total CHOL/HDL Ratio: 2.3 RATIO
Triglycerides: 28 mg/dL (ref <150)
VLDL: 6 mg/dL (ref 0–40)

## 2021-03-02 LAB — URINALYSIS, ROUTINE W REFLEX MICROSCOPIC
Bilirubin Urine: NEGATIVE
Glucose, UA: NEGATIVE mg/dL
Hgb urine dipstick: NEGATIVE
Ketones, ur: NEGATIVE mg/dL
Nitrite: NEGATIVE
Protein, ur: NEGATIVE mg/dL
Specific Gravity, Urine: 1.016 (ref 1.005–1.030)
pH: 9 — ABNORMAL HIGH (ref 5.0–8.0)

## 2021-03-02 LAB — RESP PANEL BY RT-PCR (FLU A&B, COVID) ARPGX2
Influenza A by PCR: NEGATIVE
Influenza B by PCR: NEGATIVE
SARS Coronavirus 2 by RT PCR: NEGATIVE

## 2021-03-02 LAB — CBC
HCT: 37.2 % — ABNORMAL LOW (ref 39.0–52.0)
Hemoglobin: 12.3 g/dL — ABNORMAL LOW (ref 13.0–17.0)
MCH: 32.5 pg (ref 26.0–34.0)
MCHC: 33.1 g/dL (ref 30.0–36.0)
MCV: 98.4 fL (ref 80.0–100.0)
Platelets: 163 10*3/uL (ref 150–400)
RBC: 3.78 MIL/uL — ABNORMAL LOW (ref 4.22–5.81)
RDW: 13.4 % (ref 11.5–15.5)
WBC: 13 10*3/uL — ABNORMAL HIGH (ref 4.0–10.5)
nRBC: 0 % (ref 0.0–0.2)

## 2021-03-02 LAB — DIFFERENTIAL
Abs Immature Granulocytes: 0.07 10*3/uL (ref 0.00–0.07)
Basophils Absolute: 0 10*3/uL (ref 0.0–0.1)
Basophils Relative: 0 %
Eosinophils Absolute: 0 10*3/uL (ref 0.0–0.5)
Eosinophils Relative: 0 %
Immature Granulocytes: 1 %
Lymphocytes Relative: 7 %
Lymphs Abs: 1 10*3/uL (ref 0.7–4.0)
Monocytes Absolute: 1.4 10*3/uL — ABNORMAL HIGH (ref 0.1–1.0)
Monocytes Relative: 11 %
Neutro Abs: 10.6 10*3/uL — ABNORMAL HIGH (ref 1.7–7.7)
Neutrophils Relative %: 81 %

## 2021-03-02 LAB — I-STAT CHEM 8, ED
BUN: 20 mg/dL (ref 8–23)
Calcium, Ion: 1.17 mmol/L (ref 1.15–1.40)
Chloride: 102 mmol/L (ref 98–111)
Creatinine, Ser: 1.1 mg/dL (ref 0.61–1.24)
Glucose, Bld: 103 mg/dL — ABNORMAL HIGH (ref 70–99)
HCT: 37 % — ABNORMAL LOW (ref 39.0–52.0)
Hemoglobin: 12.6 g/dL — ABNORMAL LOW (ref 13.0–17.0)
Potassium: 4.6 mmol/L (ref 3.5–5.1)
Sodium: 138 mmol/L (ref 135–145)
TCO2: 26 mmol/L (ref 22–32)

## 2021-03-02 LAB — PROTIME-INR
INR: 1.1 (ref 0.8–1.2)
Prothrombin Time: 13.7 seconds (ref 11.4–15.2)

## 2021-03-02 LAB — APTT: aPTT: 30 seconds (ref 24–36)

## 2021-03-02 LAB — HEMOGLOBIN A1C
Hgb A1c MFr Bld: 6.2 % — ABNORMAL HIGH (ref 4.8–5.6)
Mean Plasma Glucose: 131.24 mg/dL

## 2021-03-02 LAB — LACTIC ACID, PLASMA: Lactic Acid, Venous: 1.5 mmol/L (ref 0.5–1.9)

## 2021-03-02 LAB — CBG MONITORING, ED: Glucose-Capillary: 107 mg/dL — ABNORMAL HIGH (ref 70–99)

## 2021-03-02 MED ORDER — ACETAMINOPHEN 325 MG PO TABS: 650.0000 mg | ORAL_TABLET | ORAL | Status: DC | PRN

## 2021-03-02 MED ORDER — ACETAMINOPHEN 650 MG RE SUPP: 650.0000 mg | RECTAL | Status: DC | PRN

## 2021-03-02 MED ORDER — SODIUM CHLORIDE 0.9 % IV SOLN
1.0000 g | INTRAVENOUS | Status: DC
  Administered 2021-03-02 – 2021-03-04 (×3): 1 g via INTRAVENOUS
  Filled 2021-03-02: qty 10
  Filled 2021-03-02 (×2): qty 1
  Filled 2021-03-02: qty 10

## 2021-03-02 MED ORDER — ACETAMINOPHEN 160 MG/5ML PO SOLN: 650.0000 mg | ORAL | Status: DC | PRN

## 2021-03-02 MED ORDER — STROKE: EARLY STAGES OF RECOVERY BOOK
Freq: Once | Status: AC
  Filled 2021-03-02: qty 1

## 2021-03-02 MED ORDER — INSULIN ASPART 100 UNIT/ML ~~LOC~~ SOLN
0.0000 [IU] | Freq: Three times a day (TID) | SUBCUTANEOUS | Status: DC
  Administered 2021-03-03: 1 [IU] via SUBCUTANEOUS
  Administered 2021-03-03: 2 [IU] via SUBCUTANEOUS
  Administered 2021-03-04: 1 [IU] via SUBCUTANEOUS

## 2021-03-02 MED ORDER — SODIUM CHLORIDE 0.9 % IV SOLN: 1.0000 g | Freq: Once | INTRAVENOUS | Status: DC

## 2021-03-02 MED ORDER — SODIUM CHLORIDE 0.9% FLUSH
3.0000 mL | Freq: Once | INTRAVENOUS | Status: AC
Start: 2021-03-02 — End: 2021-03-02
  Administered 2021-03-02: 3 mL via INTRAVENOUS

## 2021-03-02 MED ORDER — ACETAMINOPHEN 500 MG PO TABS
1000.0000 mg | ORAL_TABLET | Freq: Once | ORAL | Status: AC
  Administered 2021-03-02: 1000 mg via ORAL
  Filled 2021-03-02: qty 2

## 2021-03-02 MED ORDER — SODIUM CHLORIDE 0.9 % IV SOLN
INTRAVENOUS | Status: AC
  Administered 2021-03-03: 1000 mL via INTRAVENOUS

## 2021-03-02 NOTE — ED Provider Notes (Addendum)
[facility] EMERGENCY DEPARTMENT Provider Note   CSN: [id number] Arrival date & time: 03/02/21  1440  An emergency department physician performed an initial assessment on this suspected stroke patient at 1441.  History Chief Complaint  Patient presents with  . Fever    Brian Dodson is a 65 y.o. male.  Patient presents as 'code stroke' noted with general weakness and altered speech today. Patient also noted w fever. Symptoms acute onset, moderate, constant, persistent. Pt unsure when fever started. No sore throat or runny nose. No cough. No headache. No neck or back pain. No chest pain or sob. No abd pain or nvd. No dysuria or gu c/o. No rash or skin lesions/sores. No extremity pain or injury. No known ill contacts. Pt denies focal or unilateral numbness/weakness.   The history is provided by the patient and the EMS personnel.       Past Medical History:  Diagnosis Date  . Asthma   . Atrial fibrillation (HCC)   . Benign prostatic hypertrophy with lower urinary tract symptoms (LUTS)   . Bulbous urethral stricture   . CAD (coronary artery disease)    s/p coronary stent 2003  . Constipation   . COPD (chronic obstructive pulmonary disease) (HCC)   . Diabetes mellitus without complication (HCC)   . Dizziness   . Dyspnea   . GERD (gastroesophageal reflux disease)   . Gross hematuria   . Hyperlipemia   . Hypertension   . Lumbago   . Palpitation   . Schizophrenia [facility])     Patient Active Problem List   Diagnosis Date Noted  . Bilateral lower extremity edema 04/01/2018  . Lower extremity pain, bilateral 04/01/2018  . Swelling of limb 10/04/2017  . Sepsis (HCC) 05/20/2017  . Anemia 05/07/2017  . B12 deficiency 05/07/2017  . Loss of memory 05/07/2017  . Atrial fibrillation with RVR (HCC) 05/23/2015  . Syncope 05/23/2015  . Schizophrenia (HCC) 05/23/2015  . Diabetes mellitus, type II (HCC) 05/23/2015  . HTN (hypertension) 05/23/2015  . GERD  (gastroesophageal reflux disease) 05/23/2015  . CAD (coronary artery disease) 05/23/2015    Past Surgical History:  Procedure Laterality Date  . COLONOSCOPY    . CORONARY ANGIOPLASTY WITH STENT PLACEMENT  11/2003  . kidney stent         Family History  Problem Relation Age of Onset  . Diabetes Other   . Bladder Cancer Neg Hx   . Kidney cancer Neg Hx   . Prostate cancer Neg Hx     Social History   Tobacco Use  . Smoking status: Former Games developer  . Smokeless tobacco: Never Used  Substance Use Topics  . Alcohol use: No  . Drug use: No    Home Medications Prior to Admission medications   Medication Sig Start Date End Date Taking? Authorizing Provider  acetaminophen (TYLENOL) 500 MG tablet Take 1,000 mg by mouth every 6 (six) hours as needed.     [provider]  amiodarone (PACERONE) 200 MG tablet Take 0.5 tablets (100 mg total) by mouth daily. Patient taking differently: Take 100 mg by mouth daily.  05/23/17   Houston Siren, MD  amLODipine (NORVASC) 5 MG tablet Take 5 mg by mouth daily.    [provider]  aspirin 81 MG tablet Take 81 mg by mouth daily.     [provider]  atenolol (TENORMIN) 25 MG tablet Take 25 mg by mouth daily.     [provider]  atorvastatin (  LIPITOR) 10 MG tablet Take 10 mg by mouth daily. 05/18/15   [provider]  benztropine (COGENTIN) 1 MG tablet Take 1 mg by mouth at bedtime.     [provider]  citalopram (CELEXA) 20 MG tablet Take 20 mg by mouth at bedtime.     [provider]  divalproex (DEPAKOTE ER) 500 MG 24 hr tablet Take 2,000 mg by mouth at bedtime.     [provider]  donepezil (ARICEPT) 10 MG tablet Take 10 mg by mouth at bedtime.     [provider]  ELIQUIS 5 MG TABS tablet Take 5 mg by mouth 2 (two) times daily. 05/18/15   [provider]  latanoprost (XALATAN) 0.005 % ophthalmic solution Place 1 drop into both eyes at bedtime.    [provider]  losartan (COZAAR) 25 MG tablet Take 1 tablet (25 mg total) by mouth daily. May increase to 2 tablets by mouth daily if blood pressure goes above 140/90 for more than a day  or if the blood pressure goes above 150/100 at all. 11/18/18 11/18/19  Arnaldo Natal, MD  metFORMIN (GLUCOPHAGE) 500 MG tablet Take 500 mg by mouth 2 (two) times daily with a meal.    [provider]  [name] (OYSTER CALCIUM) 500 MG TABS tablet Take 500 mg of elemental calcium by mouth daily.    [provider]  ranitidine (ZANTAC) 150 MG tablet Take 150 mg by mouth 2 (two) times daily.    [provider]  risperidone (RISPERDAL) 4 MG tablet Take 4 mg by mouth at bedtime. 05/18/15   [provider]    Allergies    Patient has no known allergies.  Review of Systems   Review of Systems  Constitutional: Positive for fever.  HENT: Negative for sinus pain and sore throat.   Eyes: Negative for pain, redness and visual disturbance.  Respiratory: Negative for cough and shortness of breath.   Cardiovascular: Negative for chest pain and leg swelling.  Gastrointestinal: Negative for abdominal pain, diarrhea and vomiting.  Genitourinary: Negative for dysuria and flank pain.  Musculoskeletal: Negative for back pain and neck pain.  Skin: Negative for rash.  Neurological: Negative for headaches.  Hematological: Does not bruise/bleed easily.  Psychiatric/Behavioral: Negative for agitation.    Physical Exam Updated Vital Signs BP 115/76   Pulse 91   Temp (!) 102.6 F (39.2 C) (Oral)   Resp (!) 21   Ht 1.829 m (6') Comment: From 2020  Wt 102.8 kg   SpO2 100%   BMI 30.74 kg/m   Physical Exam Vitals and nursing note reviewed.  Constitutional:      Appearance: Normal appearance. He is well-developed.  HENT:     Head: Atraumatic.     Nose: Nose normal.     Mouth/Throat:     Mouth: Mucous membranes are moist.     Pharynx: Oropharynx is clear.  Eyes:     General:  No scleral icterus.    Conjunctiva/sclera: Conjunctivae normal.     Pupils: Pupils are equal, round, and reactive to light.  Neck:     Trachea: No tracheal deviation.     Comments: No stiffness or rigidity.  Cardiovascular:     Rate and Rhythm: Normal rate and regular rhythm.     Pulses: Normal pulses.     Heart sounds: Normal heart sounds. No murmur heard. No friction rub. No gallop.   Pulmonary:     Effort: Pulmonary effort is  normal. No accessory muscle usage or respiratory distress.     Breath sounds: Normal breath sounds.  Abdominal:     General: Bowel sounds are normal. There is no distension.     Palpations: Abdomen is soft.     Tenderness: There is no abdominal tenderness. There is no guarding.  Genitourinary:    Comments: No cva tenderness. Musculoskeletal:        General: No swelling or tenderness.     Cervical back: Normal range of motion and neck supple. No rigidity.  Skin:    General: Skin is warm and dry.     Findings: No rash.  Neurological:     Mental Status: He is alert.     Comments: Alert, speech clear. Motor intact bil, stre 5/5. Sens grossly intact bil.   Psychiatric:        Mood and Affect: Mood normal.     ED Results / Procedures / Treatments   Labs (all labs ordered are listed, but only abnormal results are displayed) Results for orders placed or performed during the hospital encounter of 03/02/21  Protime-INR  Result Value Ref Range   Prothrombin Time 13.7 11.4 - 15.2 seconds   INR 1.1 0.8 - 1.2  APTT  Result Value Ref Range   aPTT 30 24 - 36 seconds  CBC  Result Value Ref Range   WBC 13.0 (H) 4.0 - 10.5 K/uL   RBC 3.78 (L) 4.22 - 5.81 MIL/uL   Hemoglobin 12.3 (L) 13.0 - 17.0 g/dL   HCT 69.6 (L) 78.9 - 38.1 %   MCV 98.4 80.0 - 100.0 fL   MCH 32.5 26.0 - 34.0 pg   MCHC 33.1 30.0 - 36.0 g/dL   RDW 01.7 51.0 - 25.8 %   Platelets 163 150 - 400 K/uL   nRBC 0.0 0.0 - 0.2 %  Differential  Result Value Ref Range   Neutrophils Relative % 81 %    Neutro Abs 10.6 (H) 1.7 - 7.7 K/uL   Lymphocytes Relative 7 %   Lymphs Abs 1.0 0.7 - 4.0 K/uL   Monocytes Relative 11 %   Monocytes Absolute 1.4 (H) 0.1 - 1.0 K/uL   Eosinophils Relative 0 %   Eosinophils Absolute 0.0 0.0 - 0.5 K/uL   Basophils Relative 0 %   Basophils Absolute 0.0 0.0 - 0.1 K/uL   Immature Granulocytes 1 %   Abs Immature Granulocytes 0.07 0.00 - 0.07 K/uL  Comprehensive metabolic panel  Result Value Ref Range   Sodium 135 135 - 145 mmol/L   Potassium 4.7 3.5 - 5.1 mmol/L   Chloride 101 98 - 111 mmol/L   CO2 25 22 - 32 mmol/L   Glucose, Bld 105 (H) 70 - 99 mg/dL   BUN 17 8 - 23 mg/dL   Creatinine, Ser 5.27 0.61 - 1.24 mg/dL   Calcium 9.1 8.9 - 78.2 mg/dL   Total Protein 6.7 6.5 - 8.1 g/dL   Albumin 3.6 3.5 - 5.0 g/dL   AST 22 15 - 41 U/L   ALT 12 0 - 44 U/L   Alkaline Phosphatase 53 38 - 126 U/L   Total Bilirubin 0.3 0.3 - 1.2 mg/dL   GFR, Estimated >42 >35 mL/min   Anion gap 9 5 - 15  Hemoglobin A1c  Result Value Ref Range   Hgb A1c MFr Bld 6.2 (H) 4.8 - 5.6 %   Mean Plasma Glucose 131.24 mg/dL  Lactic acid, plasma  Result Value Ref Range   Lactic  Acid, Venous 1.5 0.5 - 1.9 mmol/L  Urinalysis, Routine w reflex microscopic Urine, Clean Catch  Result Value Ref Range   Color, Urine YELLOW YELLOW   APPearance CLEAR CLEAR   Specific Gravity, Urine 1.016 1.005 - 1.030   pH 9.0 (H) 5.0 - 8.0   Glucose, UA NEGATIVE NEGATIVE mg/dL   Hgb urine dipstick NEGATIVE NEGATIVE   Bilirubin Urine NEGATIVE NEGATIVE   Ketones, ur NEGATIVE NEGATIVE mg/dL   Protein, ur NEGATIVE NEGATIVE mg/dL   Nitrite NEGATIVE NEGATIVE   Leukocytes,Ua TRACE (A) NEGATIVE   RBC / HPF 0-5 0 - 5 RBC/hpf   WBC, UA 11-20 0 - 5 WBC/hpf   Bacteria, UA FEW (A) NONE SEEN   Squamous Epithelial / LPF 0-5 0 - 5   Mucus PRESENT   I-stat chem 8, ED  Result Value Ref Range   Sodium 138 135 - 145 mmol/L   Potassium 4.6 3.5 - 5.1 mmol/L   Chloride 102 98 - 111 mmol/L   BUN 20 8 - 23 mg/dL    Creatinine, Ser 1.611.10 0.61 - 1.24 mg/dL   Glucose, Bld 096103 (H) 70 - 99 mg/dL   Calcium, Ion 0.451.17 4.091.15 - 1.40 mmol/L   TCO2 26 22 - 32 mmol/L   Hemoglobin 12.6 (L) 13.0 - 17.0 g/dL   HCT 81.137.0 (L) 91.439.0 - 78.252.0 %  CBG monitoring, ED  Result Value Ref Range   Glucose-Capillary 107 (H) 70 - 99 mg/dL   DG Chest Port 1 View  Result Date: 03/02/2021 CLINICAL DATA:  Fever. Code stroke. EXAM: PORTABLE CHEST 1 VIEW COMPARISON:  November 18, 2018 FINDINGS: Tortuosity of the thoracic aorta. Prominence of the aortic knob, which may be exaggerated by AP technique. Cardiomediastinal silhouette is normal. Mediastinal contours appear intact. There is no evidence of focal airspace consolidation, pleural effusion or pneumothorax. Osseous structures are without acute abnormality. Soft tissues are grossly normal. IMPRESSION: 1. Tortuosity of the thoracic aorta. 2. Prominence of the aortic knob, which may be exaggerated by AP technique. If any clinical concern for acute aortic syndrome, evaluation with CT of the chest should be considered. Electronically Signed   By: Ted Mcalpineobrinka  Dimitrova M.D.   On: 03/02/2021 15:58   CT HEAD CODE STROKE WO CONTRAST  Result Date: 03/02/2021 CLINICAL DATA:  Code stroke.  Acute neuro deficit.  Abnormal speech EXAM: CT HEAD WITHOUT CONTRAST TECHNIQUE: Contiguous axial images were obtained from the base of the skull through the vertex without intravenous contrast. COMPARISON:  CT head 01/29/2014 FINDINGS: Brain: Mild atrophy. Mild to moderate white matter changes with patchy white matter hypodensity bilaterally. Progression since 2015 Negative for acute infarct, hemorrhage, mass. Vascular: Negative for hyperdense vessel Skull: Negative Sinuses/Orbits: Minimal mucosal edema paranasal sinuses. Negative orbit Other: None ASPECTS (Alberta Stroke Program Early CT Score) - Ganglionic level infarction (caudate, lentiform nuclei, internal capsule, insula, M1-M3 cortex): 7 - Supraganglionic infarction  (M4-M6 cortex): 3 Total score (0-10 with 10 being normal): 10 IMPRESSION: 1. No acute abnormality 2. Mild atrophy and mild to moderate white matter changes most consistent with chronic microvascular ischemia with progression since 2015. 3. ASPECTS is 10 4. Code stroke imaging results were communicated on 03/02/2021 at 2:57 pm to provider Amada JupiterKirkpatrick via text page Electronically Signed   By: Marlan Palauharles  Clark M.D.   On: 03/02/2021 14:58    ED ECG REPORT   Date: 03/02/2021  Rate: 104  Rhythm: atrial fibrillation  QRS Axis: normal  Intervals: normal  ST/T Wave abnormalities: nonspecific ST/T changes  Conduction Disutrbances:none  Narrative Interpretation:   Old EKG Reviewed: changes noted  I have personally reviewed the EKG tracing    Radiology DG Chest Port 1 View  Result Date: 03/02/2021 CLINICAL DATA:  Fever. Code stroke. EXAM: PORTABLE CHEST 1 VIEW COMPARISON:  November 18, 2018 FINDINGS: Tortuosity of the thoracic aorta. Prominence of the aortic knob, which may be exaggerated by AP technique. Cardiomediastinal silhouette is normal. Mediastinal contours appear intact. There is no evidence of focal airspace consolidation, pleural effusion or pneumothorax. Osseous structures are without acute abnormality. Soft tissues are grossly normal. IMPRESSION: 1. Tortuosity of the thoracic aorta. 2. Prominence of the aortic knob, which may be exaggerated by AP technique. If any clinical concern for acute aortic syndrome, evaluation with CT of the chest should be considered. Electronically Signed   By: Ted Mcalpine M.D.   On: 03/02/2021 15:58   CT HEAD CODE STROKE WO CONTRAST  Result Date: 03/02/2021 CLINICAL DATA:  Code stroke.  Acute neuro deficit.  Abnormal speech EXAM: CT HEAD WITHOUT CONTRAST TECHNIQUE: Contiguous axial images were obtained from the base of the skull through the vertex without intravenous contrast. COMPARISON:  CT head 01/29/2014 FINDINGS: Brain: Mild atrophy. Mild to moderate  white matter changes with patchy white matter hypodensity bilaterally. Progression since 2015 Negative for acute infarct, hemorrhage, mass. Vascular: Negative for hyperdense vessel Skull: Negative Sinuses/Orbits: Minimal mucosal edema paranasal sinuses. Negative orbit Other: None ASPECTS (Alberta Stroke Program Early CT Score) - Ganglionic level infarction (caudate, lentiform nuclei, internal capsule, insula, M1-M3 cortex): 7 - Supraganglionic infarction (M4-M6 cortex): 3 Total score (0-10 with 10 being normal): 10 IMPRESSION: 1. No acute abnormality 2. Mild atrophy and mild to moderate white matter changes most consistent with chronic microvascular ischemia with progression since 2015. 3. ASPECTS is 10 4. Code stroke imaging results were communicated on 03/02/2021 at 2:57 pm to provider Amada Jupiter via text page Electronically Signed   By: Marlan Palau M.D.   On: 03/02/2021 14:58    Procedures Procedures   Medications Ordered in ED Medications  cefTRIAXone (ROCEPHIN) 1 g in sodium chloride 0.9 % 100 mL IVPB (has no administration in time range)  sodium chloride flush (NS) 0.9 % injection 3 mL (3 mLs Intravenous Given 03/02/21 1623)    ED Course  I have reviewed the triage vital signs and the nursing notes.  Pertinent labs & imaging results that were available during my care of the patient were reviewed by me and considered in my medical decision making (see chart for details).    MDM Rules/Calculators/A&P                         Iv ns. Continuous pulse ox and cardiac monitoring. Stat labs and imaging. Cultures sent.  Reviewed nursing notes and prior charts for additional history.   Neurology had evaluated as code stroke activation prior to arrival - they cancelled code stroke activation - they are recommending admission for further workup including mri, and possible cta, and recommend additional workup for febrile illness.   CT reviewed/interpreted by me - no hem.  Labs  reviewed/interpreted by me - wbc mildly elevated.  CXR reviewed/interpreted by me - no pna.   Additional labs reviewed/interpreted by me - UA w 11-20 wbc and bacteria ? Uti, will culture and rx. Rocephin iv.   Medicine consulted for admission. MRI remains pending.   Discussed pt with Dr Toniann Fail - will admit.    Final Clinical Impression(s) / ED  Diagnoses Final diagnoses:  None    Rx / DC Orders ED Discharge Orders    None          Cathren Laine, MD 03/02/21 1911

## 2021-03-02 NOTE — ED Triage Notes (Signed)
Pt facility called for new slurred speech

## 2021-03-02 NOTE — Code Documentation (Signed)
Stroke Response Nurse Documentation Code Documentation  Brian Dodson is a 65 y.o. male arriving to [facility] ED via [facility] on 03/02/2021 with past medical hx of Dementia, Diabetes, CAD, Afib on Eliquis, HLP. Code stroke was activated by EMS. Patient from [facility] where he was LKW at 1300 and now complaining of change in speech.   Stroke team at the bedside on patient arrival. Labs drawn and patient cleared for CT by EDP. Patient to CT with team. NIHSS 3, see documentation for details and code stroke times. Patient with disoriented and Expressive aphasia  on exam. The following imaging was completed:  CT. Patient is not a candidate for tPA due to being on Eliquis. Care/Plan: q2 mNIHSS/VS and MRI. Bedside handoff with ED RN Koleen Nimrod.    Lucila Maine  Stroke Response RN

## 2021-03-02 NOTE — ED Notes (Signed)
2nd Attempt at report.

## 2021-03-02 NOTE — ED Notes (Signed)
Attempted report x1. 

## 2021-03-02 NOTE — ED Notes (Signed)
Re-collect for labs drawn and sent

## 2021-03-02 NOTE — Consult Note (Signed)
Neurology Consultation  Reason for Consult: Garbled speech, difficulty with ambulating at assisted living facility Referring Physician: Dr. Denton LankSteinl  CC: Speech difficulties  History is obtained from: Chart review, EMS  HPI: Brian Dodson is a 65 y.o. male with a medical history significant for atrial fibrillation on Eliquis, peripheral vascular disease with intermittent claudication, dementia,  benign prostatic hypertrophy, coronary artery disease with stent placement in 2003, chronic obstructive pulmonary disease, type 2 diabetes mellitus, GERD, hypertension, hyperlipidemia, and schizophrenia who presented today via EMS from assisted living facility with concerns for garbled speech and trouble with ambulation. EMS states that Brian Dodson was having difficulty standing and pivoting to the stretcher and caregivers at the facility stated that this is abnormal for Brian Dodson who usually ambulates independently and even assists the facility with cleaning. EMS noted that Brian Dodson was slightly tachycardic en route with a temperature of 103.1 degrees.   LKW: 13:00 tpa given?: no, on therapeutic anticoagulation- Eliquis IR Thrombectomy? No, presentation with low suspicion for LVO ( VAN negative) Modified Rankin Scale: 3-Moderate disability-requires help but walks WITHOUT assistance  ROS: Unable to obtain due to altered mental status.   Past Medical History:  Diagnosis Date  . Asthma   . Atrial fibrillation (HCC)   . Benign prostatic hypertrophy with lower urinary tract symptoms (LUTS)   . Bulbous urethral stricture   . CAD (coronary artery disease)    s/p coronary stent 2003  . Constipation   . COPD (chronic obstructive pulmonary disease) (HCC)   . Diabetes mellitus without complication (HCC)   . Dizziness   . Dyspnea   . GERD (gastroesophageal reflux disease)   . Gross hematuria   . Hyperlipemia   . Hypertension   . Lumbago   . Palpitation   . Schizophrenia [facility](HCC)    Past Surgical  History:  Procedure Laterality Date  . COLONOSCOPY    . CORONARY ANGIOPLASTY WITH STENT PLACEMENT  11/2003  . kidney stent     Family History  Problem Relation Age of Onset  . Diabetes Other   . Bladder Cancer Neg Hx   . Kidney cancer Neg Hx   . Prostate cancer Neg Hx    Social History:   reports that he has quit smoking. He has never used smokeless tobacco. He reports that he does not drink alcohol and does not use drugs.  Medications  Current Facility-Administered Medications:  .  sodium chloride flush (NS) 0.9 % injection 3 mL, 3 mL, Intravenous, Once, Cathren LaineSteinl, Kevin, MD  Current Outpatient Medications:  .  acetaminophen (TYLENOL) 500 MG tablet, Take 1,000 mg by mouth every 6 (six) hours as needed. , Disp: , Rfl:  .  amiodarone (PACERONE) 200 MG tablet, Take 0.5 tablets (100 mg total) by mouth daily. (Patient taking differently: Take 100 mg by mouth daily. ), Disp: , Rfl:  .  amLODipine (NORVASC) 5 MG tablet, Take 5 mg by mouth daily., Disp: , Rfl:  .  aspirin 81 MG tablet, Take 81 mg by mouth daily. , Disp: , Rfl:  .  atenolol (TENORMIN) 25 MG tablet, Take 25 mg by mouth daily. , Disp: , Rfl:  .  atorvastatin (LIPITOR) 10 MG tablet, Take 10 mg by mouth daily., Disp: , Rfl:  .  benztropine (COGENTIN) 1 MG tablet, Take 1 mg by mouth at bedtime. , Disp: , Rfl:  .  citalopram (CELEXA) 20 MG tablet, Take 20 mg by mouth at bedtime. , Disp: , Rfl:  .  divalproex (DEPAKOTE  ER) 500 MG 24 hr tablet, Take 2,000 mg by mouth at bedtime. , Disp: , Rfl:  .  donepezil (ARICEPT) 10 MG tablet, Take 10 mg by mouth at bedtime. , Disp: , Rfl:  .  ELIQUIS 5 MG TABS tablet, Take 5 mg by mouth 2 (two) times daily., Disp: , Rfl:  .  latanoprost (XALATAN) 0.005 % ophthalmic solution, Place 1 drop into both eyes at bedtime., Disp: , Rfl:  .  losartan (COZAAR) 25 MG tablet, Take 1 tablet (25 mg total) by mouth daily. May increase to 2 tablets by mouth daily if blood pressure goes above 140/90 for more than a  day  or if the blood pressure goes above 150/100 at all., Disp: 30 tablet, Rfl: 11 .  metFORMIN (GLUCOPHAGE) 500 MG tablet, Take 500 mg by mouth 2 (two) times daily with a meal., Disp: , Rfl:  .  Oyster Shell (OYSTER CALCIUM) 500 MG TABS tablet, Take 500 mg of elemental calcium by mouth daily., Disp: , Rfl:  .  ranitidine (ZANTAC) 150 MG tablet, Take 150 mg by mouth 2 (two) times daily., Disp: , Rfl:  .  risperidone (RISPERDAL) 4 MG tablet, Take 4 mg by mouth at bedtime., Disp: , Rfl:   Exam: Current vital signs: BP 121/67   Ht 6' (1.829 m) Comment: From 2020  Wt 102.8 kg   BMI 30.74 kg/m  Vital signs in last 24 hours: BP: (121)/(67) 121/67 (04/14 1500) Weight:  [102.8 kg] 102.8 kg (04/14 1400)  GENERAL: Awake, alert, in no acute distress Psych: Pleasantly confused, cooperative with examination Head: Normocephalic and atraumatic, dry mm EENT: No OP obstruction, normal conjunctivae LUNGS - Normal respiratory effort. Non-labored breathing CV: Tachycardic on cardiac monitor, extremities warm, without edema ABDOMEN: Soft, nontender Ext: warm, well perfused  NEURO:  Mental Status: Awake and alert to self. States that he is 65 years old. Does not answer month with repeat questioning. Has baseline dementia. He is unable to give a clear and coherent history of present illness. His speech is without dysarthria and his language is mildly aphasic with naming 2/3 objects correct. He also answers "yes" consistently to examiner questions but does not answer "no" appropriately.  No neglect noted on examination.  Cranial Nerves:  II: PERRL 3 mm/brisk. Visual fields full, will attend to visual stimulation in all visual fields III, IV, VI: EOMI. Lid elevation symmetric and full.  V: Sensation is intact to light touch to face, patient unreliably states "yes" to symmetrical sensation.  VII: Face is symmetric resting and smiling.  VIII: Hearing is intact to voice IX, X: Phonation normal.  XI: Head  appears midline XII: Tongue protrudes midline without fasciculations.   Motor: Moves all extremities antigravity and without vertical drift. 5/5 strength noted throughout. Tone is normal. Bulk is normal.  Sensation: Intact to light touch in all four extremities, patient unreliably states "yes" to symmetrical sensation.  Coordination: FTN intact bilaterally. HKS assessment limited to patient understanding and trouble following complex commands. No pronator drift. DTRs: 2+ and symmetric patellae and biceps Gait- deferred  NIHSS: 1a Level of Conscious.: 0 1b LOC Questions: 2 1c LOC Commands: 0 2 Best Gaze: 0 3 Visual: 0 4 Facial Palsy: 0 5a Motor Arm - left: 0 5b Motor Arm - Right: 0 6a Motor Leg - Left: 0 6b Motor Leg - Right: 0 7 Limb Ataxia: 0 8 Sensory: 0 9 Best Language: 1 10 Dysarthria: 0 11 Extinct. and Inatten.: 0 TOTAL: 3  Labs I  have reviewed labs in epic and the results pertinent to this consultation are: CBC    Component Value Date/Time   WBC 13.0 (H) 03/02/2021 1444   RBC 3.78 (L) 03/02/2021 1444   HGB 12.6 (L) 03/02/2021 1450   HGB 9.4 (L) 02/14/2015 1427   HCT 37.0 (L) 03/02/2021 1450   HCT 29.6 (L) 02/14/2015 1427   PLT 163 03/02/2021 1444   PLT 106 (L) 02/14/2015 1427   MCV 98.4 03/02/2021 1444   MCV 101 (H) 02/14/2015 1427   MCH 32.5 03/02/2021 1444   MCHC 33.1 03/02/2021 1444   RDW 13.4 03/02/2021 1444   RDW 15.3 (H) 02/14/2015 1427   LYMPHSABS 1.0 03/02/2021 1444   LYMPHSABS 2.8 02/14/2015 1427   MONOABS 1.4 (H) 03/02/2021 1444   MONOABS 0.5 02/14/2015 1427   EOSABS 0.0 03/02/2021 1444   EOSABS 0.1 02/14/2015 1427   BASOSABS 0.0 03/02/2021 1444   BASOSABS 0.0 02/14/2015 1427  CMP     Component Value Date/Time   NA 138 03/02/2021 1450   NA 138 02/14/2015 1427   K 4.6 03/02/2021 1450   K 4.3 02/14/2015 1427   CL 102 03/02/2021 1450   CL 104 02/14/2015 1427   CO2 27 11/18/2018 1154   CO2 30 02/14/2015 1427   GLUCOSE 103 (H) 03/02/2021  1450   GLUCOSE 86 02/14/2015 1427   BUN 20 03/02/2021 1450   BUN 33 (H) 02/14/2015 1427   CREATININE 1.10 03/02/2021 1450   CREATININE 1.12 02/14/2015 1427   CALCIUM 8.6 (L) 11/18/2018 1154   CALCIUM 9.0 02/14/2015 1427   PROT 6.4 (L) 11/18/2018 1154   PROT 6.1 (L) 02/01/2014 0500   ALBUMIN 3.1 (L) 11/18/2018 1154   ALBUMIN 2.8 (L) 02/01/2014 0500   AST 21 11/18/2018 1154   AST 25 02/01/2014 0500   ALT 11 11/18/2018 1154   ALT 23 02/01/2014 0500   ALKPHOS 46 11/18/2018 1154   ALKPHOS 40 (L) 02/01/2014 0500   BILITOT 0.4 11/18/2018 1154   BILITOT 0.5 02/01/2014 0500   GFRNONAA >60 11/18/2018 1154   GFRNONAA >60 02/14/2015 1427   GFRAA >60 11/18/2018 1154   GFRAA >60 02/14/2015 1427   Lipid Panel  No results found for: CHOL, TRIG, HDL, CHOLHDL, VLDL, LDLCALC, LDLDIRECT No results found for: HGBA1C  Imaging I have reviewed the images obtained: CT-scan of the brain: IMPRESSION: 1. No acute abnormality 2. Mild atrophy and mild to moderate white matter changes most consistent with chronic microvascular ischemia with progression since 2015. 3. ASPECTS is 10  Assessment: 65 year old male with history as above who presents as a Code Stroke from assisted living facility for speech difficulties and difficulty ambulating.  - Patient with multiple stroke risk factors: hypertension, hyperlipidemia, age, atrial fibrillation, PVD, diabetes, CAD. Examination reveals patient pleasantly confused (dementia at baseline) with mild aphasia with trouble naming objects and answering some orientation questions (able to name 2/3). NIHSS on arrival of 3.  - CT head with mild atrophy and mild to moderate white matter changes c/w chronic microvascular ischemia with progression since 2015. - Patient febrile with temperature of 103.1 for EMS en route and leukocytosis with a WBC of 13K with a temperature of 102.6 degrees in the ED. DDx includes toxic metabolic encephalopathy due to unidentified infectious  process versus acute ischemia / infarct. Infectious work up and stroke / TIA work up in process.  Recommendations: - MRI brain  Brian Dodson, Brian Dodson [facility] Pager: [phone]  I have seen the patient reviewed  the above note.  He is a 65 year old male with dementia who is presenting with confusion in the setting of high fever.  He has no truly focal findings, just encephalopathy which in a demented patient with fever is not at all unexpected.  I would not pursue any further stroke work-up unless MRI is positive.  He is not an IV TPA candidate due to anticoagulation, and not an IR candidate due to high modified Rankin score and mild symptoms.  No evidence of nuchal rigidity or headache and I have low suspicion for any type of CNS infection.  No need for LP unless no other cause of this elevated temperature was found.  If MRI is negative, neurology will sign off and will be available on an as-needed basis.  Brian Slot, MD [facility] [phone]  If 7pm- 7am, please page neurology on call as listed in AMION.

## 2021-03-02 NOTE — ED Notes (Signed)
Report given to [place].

## 2021-03-02 NOTE — H&P (Signed)
History and Physical    Brian Dodson ZOX:[id number] DOB: 1956-07-31 DOA: 03/02/2021  PCP: Galvin ProfferHague, Imran P, MD  Patient coming from: Assisted living facility.  Chief Complaint: Trouble speaking and confusion.  HPI: Brian Dodson is a 65 y.o. male with known history of dementia, paroxysmal atrial fibrillation, diabetes mellitus, CAD status post stenting and hypertension was found to be increasingly confused with garbled speech and difficulty ambulating and was brought to the ER.  Most of the history was obtained from ER physician as patient appears confused.  Family not at bedside.  ED Course: In the ER patient had a fever 103 F.  UA is concerning for UTI.  Covid test was negative.  CT of the head was unremarkable.  On-call neurology was consulted since there was some concern for possible stroke given the garbled speech.  Neurology recommended MRI brain and if positive for stroke then further stroke work-up.  Patient had blood cultures drawn and started on empiric antibiotics.  Chest x-ray was unremarkable Covid test was negative.  EKG shows atrial flutter rate controlled.  Review of Systems: As per HPI, rest all negative.   Past Medical History:  Diagnosis Date  . Asthma   . Atrial fibrillation (HCC)   . Benign prostatic hypertrophy with lower urinary tract symptoms (LUTS)   . Bulbous urethral stricture   . CAD (coronary artery disease)    s/p coronary stent 2003  . Constipation   . COPD (chronic obstructive pulmonary disease) (HCC)   . Diabetes mellitus without complication (HCC)   . Dizziness   . Dyspnea   . GERD (gastroesophageal reflux disease)   . Gross hematuria   . Hyperlipemia   . Hypertension   . Lumbago   . Palpitation   . Schizophrenia [facility](HCC)     Past Surgical History:  Procedure Laterality Date  . COLONOSCOPY    . CORONARY ANGIOPLASTY WITH STENT PLACEMENT  11/2003  . kidney stent       reports that he has quit smoking. He has never used smokeless tobacco. He  reports that he does not drink alcohol and does not use drugs.  No Known Allergies  Family History  Problem Relation Age of Onset  . Diabetes Other   . Bladder Cancer Neg Hx   . Kidney cancer Neg Hx   . Prostate cancer Neg Hx     Prior to Admission medications   Medication Sig Start Date End Date Taking? Authorizing Provider  acetaminophen (TYLENOL) 500 MG tablet Take 1,000 mg by mouth every 6 (six) hours as needed.     [provider]  amiodarone (PACERONE) 200 MG tablet Take 0.5 tablets (100 mg total) by mouth daily. Patient taking differently: Take 100 mg by mouth daily.  05/23/17   Houston SirenSainani, Vivek J, MD  amLODipine (NORVASC) 5 MG tablet Take 5 mg by mouth daily.    [provider]  aspirin 81 MG tablet Take 81 mg by mouth daily.     [provider]  atenolol (TENORMIN) 25 MG tablet Take 25 mg by mouth daily.     [provider]  atorvastatin (LIPITOR) 10 MG tablet Take 10 mg by mouth daily. 05/18/15   [provider]  benztropine (COGENTIN) 1 MG tablet Take 1 mg by mouth at bedtime.     [provider]  citalopram (CELEXA) 20 MG tablet Take 20 mg by mouth at bedtime.     [provider]  divalproex (DEPAKOTE ER) 500 MG 24  hr tablet Take 2,000 mg by mouth at bedtime.     [provider]  donepezil (ARICEPT) 10 MG tablet Take 10 mg by mouth at bedtime.     [provider]  ELIQUIS 5 MG TABS tablet Take 5 mg by mouth 2 (two) times daily. 05/18/15   [provider]  latanoprost (XALATAN) 0.005 % ophthalmic solution Place 1 drop into both eyes at bedtime.    [provider]  losartan (COZAAR) 25 MG tablet Take 1 tablet (25 mg total) by mouth daily. May increase to 2 tablets by mouth daily if blood pressure goes above 140/90 for more than a day  or if the blood pressure goes above 150/100 at all. 11/18/18 11/18/19  Arnaldo Natal, MD  metFORMIN (GLUCOPHAGE) 500 MG tablet Take 500 mg by mouth 2  (two) times daily with a meal.    [provider]  [name] (OYSTER CALCIUM) 500 MG TABS tablet Take 500 mg of elemental calcium by mouth daily.    [provider]  ranitidine (ZANTAC) 150 MG tablet Take 150 mg by mouth 2 (two) times daily.    [provider]  risperidone (RISPERDAL) 4 MG tablet Take 4 mg by mouth at bedtime. 05/18/15   [provider]    Physical Exam: Constitutional: Moderately built and nourished. Vitals:   03/02/21 1730 03/02/21 1745 03/02/21 1800 03/02/21 1815  BP: 114/75 107/70 105/73 118/78  Pulse: 87 91 96 93  Resp: (!) 22 (!) 23 (!) 23 14  Temp:      TempSrc:      SpO2: 100% 100% 100% 99%  Weight:      Height:       Eyes: Anicteric no pallor. ENMT: No discharge from the ears eyes nose or mouth. Neck: No mass felt.  No neck rigidity. Respiratory: No rhonchi or crepitations. Cardiovascular: S1-S2 heard. Abdomen: Soft nontender bowel sound present. Musculoskeletal: No edema. Skin: No rash. Neurologic: Alert awake oriented with name.  Moving all extremities. Psychiatric: Oriented to his name.   Labs on Admission: I have personally reviewed following labs and imaging studies  CBC: Recent Labs  Lab 03/02/21 1444 03/02/21 1450  WBC 13.0*  --   NEUTROABS 10.6*  --   HGB 12.3* 12.6*  HCT 37.2* 37.0*  MCV 98.4  --   PLT 163  --    Basic Metabolic Panel: Recent Labs  Lab 03/02/21 1444 03/02/21 1450  NA 135 138  K 4.7 4.6  CL 101 102  CO2 25  --   GLUCOSE 105* 103*  BUN 17 20  CREATININE 1.17 1.10  CALCIUM 9.1  --    GFR: Estimated Creatinine Clearance: 83 mL/min (by C-G formula based on SCr of 1.1 mg/dL). Liver Function Tests: Recent Labs  Lab 03/02/21 1444  AST 22  ALT 12  ALKPHOS 53  BILITOT 0.3  PROT 6.7  ALBUMIN 3.6   No results for input(s): LIPASE, AMYLASE in the last 168 hours. No results for input(s): AMMONIA in the last 168 hours. Coagulation Profile: Recent Labs  Lab  03/02/21 1444  INR 1.1   Cardiac Enzymes: No results for input(s): CKTOTAL, CKMB, CKMBINDEX, TROPONINI in the last 168 hours. BNP (last 3 results) No results for input(s): PROBNP in the last 8760 hours. HbA1C: Recent Labs    03/02/21 1533  HGBA1C 6.2*   CBG: Recent Labs  Lab 03/02/21 1443  GLUCAP 107*   Lipid Profile: Recent Labs    03/02/21 1556  CHOL 150  HDL 64  LDLCALC 80  TRIG 28  CHOLHDL 2.3   Thyroid Function Tests: No results for input(s): TSH, T4TOTAL, FREET4, T3FREE, THYROIDAB in the last 72 hours. Anemia Panel: No results for input(s): VITAMINB12, FOLATE, FERRITIN, TIBC, IRON, RETICCTPCT in the last 72 hours. Urine analysis:    Component Value Date/Time   COLORURINE YELLOW 03/02/2021 1537   APPEARANCEUR CLEAR 03/02/2021 1537   APPEARANCEUR Clear 03/14/2016 1027   LABSPEC 1.016 03/02/2021 1537   LABSPEC 1.017 01/29/2014 1545   PHURINE 9.0 (H) 03/02/2021 1537   GLUCOSEU NEGATIVE 03/02/2021 1537   GLUCOSEU Negative 01/29/2014 1545   HGBUR NEGATIVE 03/02/2021 1537   BILIRUBINUR NEGATIVE 03/02/2021 1537   BILIRUBINUR Negative 03/14/2016 1027   BILIRUBINUR Negative 01/29/2014 1545   KETONESUR NEGATIVE 03/02/2021 1537   PROTEINUR NEGATIVE 03/02/2021 1537   NITRITE NEGATIVE 03/02/2021 1537   LEUKOCYTESUR TRACE (A) 03/02/2021 1537   LEUKOCYTESUR Negative 01/29/2014 1545   Sepsis Labs: @LABRCNTIP (procalcitonin:4,lacticidven:4) ) Recent Results (from the past 240 hour(s))  Resp Panel by RT-PCR (Flu A&B, Covid) Urine, Clean Catch     Status: None   Collection Time: 03/02/21  3:38 PM   Specimen: Urine, Clean Catch; Nasopharyngeal(NP) swabs in vial transport medium  Result Value Ref Range Status   SARS Coronavirus 2 by RT PCR NEGATIVE NEGATIVE Final    Comment: (NOTE) SARS-CoV-2 target nucleic acids are NOT DETECTED.  The SARS-CoV-2 RNA is generally detectable in upper respiratory specimens during the acute phase of infection. The lowest concentration  of SARS-CoV-2 viral copies this assay can detect is 138 copies/mL. A negative result does not preclude SARS-Cov-2 infection and should not be used as the sole basis for treatment or other patient management decisions. A negative result may occur with  improper specimen collection/handling, submission of specimen other than nasopharyngeal swab, presence of viral mutation(s) within the areas targeted by this assay, and inadequate number of viral copies(<138 copies/mL). A negative result must be combined with clinical observations, patient history, and epidemiological information. The expected result is Negative.  Fact Sheet for Patients:  03/04/21  Fact Sheet for Healthcare Providers:  BloggerCourse.com  This test is no t yet approved or cleared by the SeriousBroker.it FDA and  has been authorized for detection and/or diagnosis of SARS-CoV-2 by FDA under an Emergency Use Authorization (EUA). This EUA will remain  in effect (meaning this test can be used) for the duration of the COVID-19 declaration under Section 564(b)(1) of the Act, 21 U.S.C.section 360bbb-3(b)(1), unless the authorization is terminated  or revoked sooner.       Influenza A by PCR NEGATIVE NEGATIVE Final   Influenza B by PCR NEGATIVE NEGATIVE Final    Comment: (NOTE) The Xpert Xpress SARS-CoV-2/FLU/RSV plus assay is intended as an aid in the diagnosis of influenza from Nasopharyngeal swab specimens and should not be used as a sole basis for treatment. Nasal washings and aspirates are unacceptable for Xpert Xpress SARS-CoV-2/FLU/RSV testing.  Fact Sheet for Patients: [place]  Fact Sheet for Healthcare Providers: BloggerCourse.com  This test is not yet approved or cleared by the SeriousBroker.it FDA and has been authorized for detection and/or diagnosis of SARS-CoV-2 by FDA under an Emergency Use  Authorization (EUA). This EUA will remain in effect (meaning this test can be used) for the duration of the COVID-19 declaration under Section 564(b)(1) of the Act, 21 U.S.C. section 360bbb-3(b)(1), unless the authorization is terminated or revoked.  Performed at [facility], [address],  [place], [place] [address]      Radiological Exams on Admission: DG Chest Port 1 View  Result Date: 03/02/2021 CLINICAL DATA:  Fever. Code stroke. EXAM: PORTABLE CHEST 1 VIEW COMPARISON:  November 18, 2018 FINDINGS: Tortuosity of the thoracic aorta. Prominence of the aortic knob, which may be exaggerated by AP technique. Cardiomediastinal silhouette is normal. Mediastinal contours appear intact. There is no evidence of focal airspace consolidation, pleural effusion or pneumothorax. Osseous structures are without acute abnormality. Soft tissues are grossly normal. IMPRESSION: 1. Tortuosity of the thoracic aorta. 2. Prominence of the aortic knob, which may be exaggerated by AP technique. If any clinical concern for acute aortic syndrome, evaluation with CT of the chest should be considered. Electronically Signed   By: Ted Mcalpine M.D.   On: 03/02/2021 15:58   CT HEAD CODE STROKE WO CONTRAST  Result Date: 03/02/2021 CLINICAL DATA:  Code stroke.  Acute neuro deficit.  Abnormal speech EXAM: CT HEAD WITHOUT CONTRAST TECHNIQUE: Contiguous axial images were obtained from the base of the skull through the vertex without intravenous contrast. COMPARISON:  CT head 01/29/2014 FINDINGS: Brain: Mild atrophy. Mild to moderate white matter changes with patchy white matter hypodensity bilaterally. Progression since 2015 Negative for acute infarct, hemorrhage, mass. Vascular: Negative for hyperdense vessel Skull: Negative Sinuses/Orbits: Minimal mucosal edema paranasal sinuses. Negative orbit Other: None ASPECTS ([place]berta Stroke Program Early CT Score) - Ganglionic level infarction (caudate, lentiform nuclei,  internal capsule, insula, M1-M3 cortex): 7 - Supraganglionic infarction (M4-M6 cortex): 3 Total score (0-10 with 10 being normal): 10 IMPRESSION: 1. No acute abnormality 2. Mild atrophy and mild to moderate white matter changes most consistent with chronic microvascular ischemia with progression since 2015. 3. ASPECTS is 10 4. Code stroke imaging results were communicated on 03/02/2021 at 2:57 pm to provider Amada Jupiter via text page Electronically Signed   By: Marlan Palau M.D.   On: 03/02/2021 14:58    EKG: Independently reviewed.  Atrial flutter rate controlled.  Assessment/Plan Principal Problem:   Acute encephalopathy Active Problems:   Diabetes mellitus, type II (HCC)   HTN (hypertension)   CAD (coronary artery disease)   Anemia   Acute lower UTI   Unspecified atrial fibrillation (HCC)    1. Acute encephalopathy with fever likely source could be UTI.  On empiric antibiotics for now.  Gently hydrate.  Appreciate neurology consult.  If patient's MRI is positive for stroke will need further stroke work-up.  Patient passed stroke swallow. 2. History of atrial fibrillation flutter presently further rate control.  Eliquis to be restarted if MRI brain is negative for stroke.  On amiodarone for rate control. 3. Hypertension on amlodipine and Cozaar. 4. History of CAD denies any chest pain. 5. Diabetes mellitus type 2 we will keep patient on sliding scale coverage. 6. Chronic anemia follow CBC.   DVT prophylaxis: Eliquis. Code Status: Full code. Family Communication: We will need to discuss with family. Disposition Plan: Back to facility when stable. Consults called: Neurology. Admission status: Observation.   Eduard Clos MD [facility] Pager [phone].  If 7PM-7AM, please contact night-coverage [email or web address] Password [facility]  03/02/2021, 7:32 PM

## 2021-03-02 NOTE — ED Notes (Signed)
3rd attempt at report. 

## 2021-03-02 NOTE — ED Notes (Signed)
Patient transported to CT back from CT

## 2021-03-03 ENCOUNTER — Observation Stay (HOSPITAL_COMMUNITY): Payer: Medicare Other

## 2021-03-03 DIAGNOSIS — Z7984 Long term (current) use of oral hypoglycemic drugs: Secondary | ICD-10-CM | POA: Diagnosis not present

## 2021-03-03 DIAGNOSIS — K219 Gastro-esophageal reflux disease without esophagitis: Secondary | ICD-10-CM | POA: Diagnosis present

## 2021-03-03 DIAGNOSIS — Z955 Presence of coronary angioplasty implant and graft: Secondary | ICD-10-CM | POA: Diagnosis not present

## 2021-03-03 DIAGNOSIS — R4701 Aphasia: Secondary | ICD-10-CM | POA: Diagnosis present

## 2021-03-03 DIAGNOSIS — J449 Chronic obstructive pulmonary disease, unspecified: Secondary | ICD-10-CM | POA: Diagnosis present

## 2021-03-03 DIAGNOSIS — I2510 Atherosclerotic heart disease of native coronary artery without angina pectoris: Secondary | ICD-10-CM

## 2021-03-03 DIAGNOSIS — N401 Enlarged prostate with lower urinary tract symptoms: Secondary | ICD-10-CM | POA: Diagnosis present

## 2021-03-03 DIAGNOSIS — Z7901 Long term (current) use of anticoagulants: Secondary | ICD-10-CM | POA: Diagnosis not present

## 2021-03-03 DIAGNOSIS — D638 Anemia in other chronic diseases classified elsewhere: Secondary | ICD-10-CM | POA: Diagnosis not present

## 2021-03-03 DIAGNOSIS — R29703 NIHSS score 3: Secondary | ICD-10-CM | POA: Diagnosis present

## 2021-03-03 DIAGNOSIS — A419 Sepsis, unspecified organism: Secondary | ICD-10-CM | POA: Diagnosis present

## 2021-03-03 DIAGNOSIS — I4892 Unspecified atrial flutter: Secondary | ICD-10-CM | POA: Diagnosis present

## 2021-03-03 DIAGNOSIS — D649 Anemia, unspecified: Secondary | ICD-10-CM

## 2021-03-03 DIAGNOSIS — F203 Undifferentiated schizophrenia: Secondary | ICD-10-CM

## 2021-03-03 DIAGNOSIS — E1151 Type 2 diabetes mellitus with diabetic peripheral angiopathy without gangrene: Secondary | ICD-10-CM | POA: Diagnosis present

## 2021-03-03 DIAGNOSIS — G9341 Metabolic encephalopathy: Secondary | ICD-10-CM | POA: Diagnosis present

## 2021-03-03 DIAGNOSIS — N390 Urinary tract infection, site not specified: Secondary | ICD-10-CM | POA: Diagnosis present

## 2021-03-03 DIAGNOSIS — Z87891 Personal history of nicotine dependence: Secondary | ICD-10-CM | POA: Diagnosis not present

## 2021-03-03 DIAGNOSIS — F0390 Unspecified dementia without behavioral disturbance: Secondary | ICD-10-CM | POA: Diagnosis present

## 2021-03-03 DIAGNOSIS — I10 Essential (primary) hypertension: Secondary | ICD-10-CM | POA: Diagnosis present

## 2021-03-03 DIAGNOSIS — E669 Obesity, unspecified: Secondary | ICD-10-CM | POA: Diagnosis present

## 2021-03-03 DIAGNOSIS — Z79899 Other long term (current) drug therapy: Secondary | ICD-10-CM | POA: Diagnosis not present

## 2021-03-03 DIAGNOSIS — Z6829 Body mass index (BMI) 29.0-29.9, adult: Secondary | ICD-10-CM | POA: Diagnosis not present

## 2021-03-03 DIAGNOSIS — I480 Paroxysmal atrial fibrillation: Secondary | ICD-10-CM

## 2021-03-03 DIAGNOSIS — Z7982 Long term (current) use of aspirin: Secondary | ICD-10-CM | POA: Diagnosis not present

## 2021-03-03 DIAGNOSIS — Z20822 Contact with and (suspected) exposure to covid-19: Secondary | ICD-10-CM | POA: Diagnosis present

## 2021-03-03 DIAGNOSIS — R4781 Slurred speech: Secondary | ICD-10-CM | POA: Diagnosis present

## 2021-03-03 DIAGNOSIS — Z8673 Personal history of transient ischemic attack (TIA), and cerebral infarction without residual deficits: Secondary | ICD-10-CM | POA: Diagnosis not present

## 2021-03-03 DIAGNOSIS — E119 Type 2 diabetes mellitus without complications: Secondary | ICD-10-CM | POA: Diagnosis not present

## 2021-03-03 DIAGNOSIS — R509 Fever, unspecified: Secondary | ICD-10-CM | POA: Diagnosis not present

## 2021-03-03 DIAGNOSIS — E785 Hyperlipidemia, unspecified: Secondary | ICD-10-CM | POA: Diagnosis present

## 2021-03-03 LAB — GLUCOSE, CAPILLARY
Glucose-Capillary: 81 mg/dL (ref 70–99)
Glucose-Capillary: 159 mg/dL — ABNORMAL HIGH (ref 70–99)
Glucose-Capillary: 130 mg/dL — ABNORMAL HIGH (ref 70–99)
Glucose-Capillary: 156 mg/dL — ABNORMAL HIGH (ref 70–99)

## 2021-03-03 LAB — CBC WITH DIFFERENTIAL/PLATELET
Abs Immature Granulocytes: 0.1 10*3/uL — ABNORMAL HIGH (ref 0.00–0.07)
Basophils Absolute: 0 10*3/uL (ref 0.0–0.1)
Basophils Relative: 0 %
Eosinophils Absolute: 0.1 10*3/uL (ref 0.0–0.5)
Eosinophils Relative: 0 %
HCT: 35 % — ABNORMAL LOW (ref 39.0–52.0)
Hemoglobin: 11.5 g/dL — ABNORMAL LOW (ref 13.0–17.0)
Immature Granulocytes: 1 %
Lymphocytes Relative: 16 %
Lymphs Abs: 2.6 10*3/uL (ref 0.7–4.0)
MCH: 32.4 pg (ref 26.0–34.0)
MCHC: 32.9 g/dL (ref 30.0–36.0)
MCV: 98.6 fL (ref 80.0–100.0)
Monocytes Absolute: 1.1 10*3/uL — ABNORMAL HIGH (ref 0.1–1.0)
Monocytes Relative: 7 %
Neutro Abs: 11.9 10*3/uL — ABNORMAL HIGH (ref 1.7–7.7)
Neutrophils Relative %: 76 %
Platelets: 144 10*3/uL — ABNORMAL LOW (ref 150–400)
RBC: 3.55 MIL/uL — ABNORMAL LOW (ref 4.22–5.81)
RDW: 13.5 % (ref 11.5–15.5)
WBC: 15.8 10*3/uL — ABNORMAL HIGH (ref 4.0–10.5)
nRBC: 0 % (ref 0.0–0.2)

## 2021-03-03 LAB — ECHOCARDIOGRAM COMPLETE
AR max vel: 3.82 cm2
AV Area VTI: 3.7 cm2
AV Area mean vel: 3.66 cm2
AV Mean grad: 2.7 mmHg
AV Peak grad: 4.8 mmHg
Ao pk vel: 1.09 m/s
Height: 72 in
S' Lateral: 3.6 cm
Weight: 3477.98 oz

## 2021-03-03 LAB — COMPREHENSIVE METABOLIC PANEL
ALT: 12 U/L (ref 0–44)
AST: 21 U/L (ref 15–41)
Albumin: 2.8 g/dL — ABNORMAL LOW (ref 3.5–5.0)
Alkaline Phosphatase: 48 U/L (ref 38–126)
Anion gap: 6 (ref 5–15)
BUN: 16 mg/dL (ref 8–23)
CO2: 25 mmol/L (ref 22–32)
Calcium: 8.7 mg/dL — ABNORMAL LOW (ref 8.9–10.3)
Chloride: 104 mmol/L (ref 98–111)
Creatinine, Ser: 1.04 mg/dL (ref 0.61–1.24)
GFR, Estimated: >60 mL/min (ref >60)
Glucose, Bld: 124 mg/dL — ABNORMAL HIGH (ref 70–99)
Potassium: 4 mmol/L (ref 3.5–5.1)
Sodium: 135 mmol/L (ref 135–145)
Total Bilirubin: 0.5 mg/dL (ref 0.3–1.2)
Total Protein: 6 g/dL — ABNORMAL LOW (ref 6.5–8.1)

## 2021-03-03 LAB — LACTIC ACID, PLASMA
Lactic Acid, Venous: 1.4 mmol/L (ref 0.5–1.9)
Lactic Acid, Venous: 1.4 mmol/L (ref 0.5–1.9)

## 2021-03-03 LAB — HIV ANTIBODY (ROUTINE TESTING W REFLEX): HIV Screen 4th Generation wRfx: NONREACTIVE

## 2021-03-03 LAB — PROCALCITONIN: Procalcitonin: 0.3 ng/mL

## 2021-03-03 LAB — PHOSPHORUS: Phosphorus: 2.4 mg/dL — ABNORMAL LOW (ref 2.5–4.6)

## 2021-03-03 LAB — MAGNESIUM: Magnesium: 1.9 mg/dL (ref 1.7–2.4)

## 2021-03-03 MED ORDER — CITALOPRAM HYDROBROMIDE 20 MG PO TABS
20.0000 mg | ORAL_TABLET | Freq: Every day | ORAL | Status: DC
  Administered 2021-03-03 – 2021-03-04 (×2): 20 mg via ORAL
  Filled 2021-03-03 (×2): qty 1

## 2021-03-03 MED ORDER — CALCIUM CARBONATE 1250 (500 CA) MG PO TABS
500.0000 mg | ORAL_TABLET | Freq: Two times a day (BID) | ORAL | Status: DC
  Administered 2021-03-03 – 2021-03-05 (×5): 500 mg via ORAL
  Filled 2021-03-03 (×5): qty 1

## 2021-03-03 MED ORDER — DIVALPROEX SODIUM ER 500 MG PO TB24
2000.0000 mg | ORAL_TABLET | Freq: Every day | ORAL | Status: DC
  Administered 2021-03-03 – 2021-03-04 (×2): 2000 mg via ORAL
  Filled 2021-03-03 (×3): qty 4

## 2021-03-03 MED ORDER — MELATONIN 5 MG PO TABS
5.0000 mg | ORAL_TABLET | Freq: Every day | ORAL | Status: DC
  Administered 2021-03-03 – 2021-03-04 (×2): 5 mg via ORAL
  Filled 2021-03-03 (×2): qty 1

## 2021-03-03 MED ORDER — AMIODARONE HCL 100 MG PO TABS
100.0000 mg | ORAL_TABLET | Freq: Every day | ORAL | Status: DC
  Administered 2021-03-03 – 2021-03-05 (×3): 100 mg via ORAL
  Filled 2021-03-03 (×3): qty 1

## 2021-03-03 MED ORDER — BENZTROPINE MESYLATE 1 MG PO TABS
1.0000 mg | ORAL_TABLET | Freq: Every day | ORAL | Status: DC
  Administered 2021-03-03 – 2021-03-04 (×2): 1 mg via ORAL
  Filled 2021-03-03 (×4): qty 1

## 2021-03-03 MED ORDER — BRIMONIDINE TARTRATE-TIMOLOL 0.2-0.5 % OP SOLN: 1.0000 [drp] | Freq: Two times a day (BID) | OPHTHALMIC | Status: DC

## 2021-03-03 MED ORDER — APIXABAN 5 MG PO TABS
5.0000 mg | ORAL_TABLET | Freq: Two times a day (BID) | ORAL | Status: DC
  Administered 2021-03-03 – 2021-03-05 (×5): 5 mg via ORAL
  Filled 2021-03-03: qty 1
  Filled 2021-03-03: qty 2
  Filled 2021-03-03 (×3): qty 1

## 2021-03-03 MED ORDER — DONEPEZIL HCL 10 MG PO TABS
10.0000 mg | ORAL_TABLET | Freq: Every day | ORAL | Status: DC
  Administered 2021-03-03 – 2021-03-04 (×2): 10 mg via ORAL
  Filled 2021-03-03 (×2): qty 1

## 2021-03-03 MED ORDER — FERROUS SULFATE 325 (65 FE) MG PO TABS
325.0000 mg | ORAL_TABLET | Freq: Every day | ORAL | Status: DC
  Administered 2021-03-03 – 2021-03-05 (×3): 325 mg via ORAL
  Filled 2021-03-03 (×3): qty 1

## 2021-03-03 MED ORDER — RISPERIDONE 2 MG PO TABS
4.0000 mg | ORAL_TABLET | Freq: Every day | ORAL | Status: DC
  Administered 2021-03-03 – 2021-03-04 (×2): 4 mg via ORAL
  Filled 2021-03-03 (×4): qty 2

## 2021-03-03 MED ORDER — TIMOLOL MALEATE 0.5 % OP SOLN
1.0000 [drp] | Freq: Two times a day (BID) | OPHTHALMIC | Status: DC
  Administered 2021-03-03 – 2021-03-05 (×5): 1 [drp] via OPHTHALMIC
  Filled 2021-03-03: qty 5

## 2021-03-03 MED ORDER — VITAMIN D 25 MCG (1000 UNIT) PO TABS
50.0000 ug | ORAL_TABLET | Freq: Every day | ORAL | Status: DC
  Administered 2021-03-03 – 2021-03-05 (×3): 50 ug via ORAL
  Filled 2021-03-03: qty 2
  Filled 2021-03-03 (×2): qty 1

## 2021-03-03 MED ORDER — LOSARTAN POTASSIUM 25 MG PO TABS
25.0000 mg | ORAL_TABLET | Freq: Every day | ORAL | Status: DC
  Administered 2021-03-03 – 2021-03-05 (×3): 25 mg via ORAL
  Filled 2021-03-03 (×3): qty 1

## 2021-03-03 MED ORDER — LATANOPROST 0.005 % OP SOLN
1.0000 [drp] | Freq: Every day | OPHTHALMIC | Status: DC
  Administered 2021-03-03 – 2021-03-04 (×2): 1 [drp] via OPHTHALMIC
  Filled 2021-03-03: qty 2.5

## 2021-03-03 MED ORDER — ATORVASTATIN CALCIUM 10 MG PO TABS
10.0000 mg | ORAL_TABLET | Freq: Every day | ORAL | Status: DC
  Administered 2021-03-03 – 2021-03-05 (×3): 10 mg via ORAL
  Filled 2021-03-03 (×3): qty 1

## 2021-03-03 MED ORDER — AMLODIPINE BESYLATE 5 MG PO TABS
5.0000 mg | ORAL_TABLET | Freq: Every day | ORAL | Status: DC
  Administered 2021-03-03 – 2021-03-05 (×3): 5 mg via ORAL
  Filled 2021-03-03 (×3): qty 1

## 2021-03-03 MED ORDER — PANTOPRAZOLE SODIUM 40 MG PO TBEC
40.0000 mg | DELAYED_RELEASE_TABLET | Freq: Every day | ORAL | Status: DC
  Administered 2021-03-03 – 2021-03-05 (×3): 40 mg via ORAL
  Filled 2021-03-03 (×3): qty 1

## 2021-03-03 MED ORDER — BRIMONIDINE TARTRATE 0.2 % OP SOLN
1.0000 [drp] | Freq: Two times a day (BID) | OPHTHALMIC | Status: DC
  Administered 2021-03-03 – 2021-03-05 (×5): 1 [drp] via OPHTHALMIC
  Filled 2021-03-03: qty 5

## 2021-03-03 NOTE — Progress Notes (Signed)
  Echocardiogram 2D Echocardiogram has been performed.  Gerda Diss 03/03/2021, 4:05 PM

## 2021-03-03 NOTE — Evaluation (Addendum)
Occupational Therapy Evaluation Patient Details Name: Brian Dodson MRN: [id number] DOB: November 02, 1956 Today's Date: 03/03/2021    History of Present Illness Pt is a 65 year old man admitted from [facility] with garbled speech and difficulty ambulating. 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 PMH: dementia, PAF, DM, CAD s/p stenting, HTN, COPD, asthma, schizophrenia, BPH, chronic anemia.   Clinical Impression   Pt reports walking independently and being able to stand up from the ground (he likes to look for 4 leaf clover). He is independent in self care, sits to shower. Pt presents with impaired cognition, likely baseline, and is very pleasant and cooperative. He is overall functioning at a set up to min guard level, ambulating pushing his IV pole. Will follow acutely. Recommend return to his ALF.     Follow Up Recommendations  No OT follow up;Supervision/Assistance - 24 hour (return to ALF)    Equipment Recommendations  None recommended by OT    Recommendations for Other Services       Precautions / Restrictions Precautions Precautions: Fall      Mobility Bed Mobility Overal bed mobility: Modified Independent             General bed mobility comments: slow    Transfers Overall transfer level: Needs assistance Equipment used: None Transfers: Sit to/from Stand Sit to Stand: Supervision         General transfer comment: slow to rise, no LOB    Balance                                           ADL either performed or assessed with clinical judgement   ADL Overall ADL's : Needs assistance/impaired Eating/Feeding: Independent   Grooming: Wash/dry hands;Standing;Supervision/safety   Upper Body Bathing: Set up;Sitting   Lower Body Bathing: Supervison/ safety;Sit to/from stand   Upper Body Dressing : Set up;Sitting   Lower Body Dressing: Supervision/safety;Sit to/from stand   Toilet Transfer:  Supervision/safety;Ambulation           Functional mobility during ADLs: Supervision/safety (pushed IV pole)       Vision Baseline Vision/History: No visual deficits Patient Visual Report: No change from baseline       Perception     Praxis      Pertinent Vitals/Pain Pain Assessment: No/denies pain     Hand Dominance Right   Extremity/Trunk Assessment Upper Extremity Assessment Upper Extremity Assessment: Overall WFL for tasks assessed   Lower Extremity Assessment Lower Extremity Assessment: Defer to PT evaluation   Cervical / Trunk Assessment Cervical / Trunk Assessment: Normal   Communication Communication Communication: No difficulties   Cognition Arousal/Alertness: Awake/alert Behavior During Therapy: WFL for tasks assessed/performed Overall Cognitive Status: History of cognitive impairments - at baseline                                     General Comments       Exercises     Shoulder Instructions      Home Living Family/patient expects to be discharged to:: Assisted living                                        Prior  Functioning/Environment Level of Independence: Needs assistance  Gait / Transfers Assistance Needed: walks without a device ADL's / Homemaking Assistance Needed: showers in sitting, dresses and feeds himself, facility manages IADL            OT Problem List: Impaired balance (sitting and/or standing)      OT Treatment/Interventions: Self-care/ADL training    OT Goals(Current goals can be found in the care plan section) Acute Rehab OT Goals Patient Stated Goal: go to heaven to see his mother OT Goal Formulation: With patient Time For Goal Achievement: 03/17/21 Potential to Achieve Goals: Good ADL Goals Pt Will Perform Grooming: standing;Independently Pt Will Perform Lower Body Bathing: sit to/from stand;Independently Pt Will Perform Lower Body Dressing: sit to/from stand;Independently Pt  Will Transfer to Toilet: ambulating;Independently Pt Will Perform Toileting - Clothing Manipulation and hygiene: Independently;sit to/from stand Pt Will Perform Tub/Shower Transfer: Shower transfer;with supervision;ambulating;shower seat Additional ADL Goal #1: Pt will gather items for ADL around his room independently.  OT Frequency: Min 2X/week   Barriers to D/C:            Co-evaluation              AM-PAC OT "6 Clicks" Daily Activity     Outcome Measure Help from another person eating meals?: None Help from another person taking care of personal grooming?: A Little Help from another person toileting, which includes using toliet, bedpan, or urinal?: A Little Help from another person bathing (including washing, rinsing, drying)?: A Little Help from another person to put on and taking off regular upper body clothing?: None Help from another person to put on and taking off regular lower body clothing?: A Little 6 Click Score: 20   End of Session Nurse Communication: Mobility status  Activity Tolerance: Patient tolerated treatment well Patient left: in chair;with call bell/phone within reach;with chair alarm set  OT Visit Diagnosis: Unsteadiness on feet (R26.81)                Time: 0981-1914 OT Time Calculation (min): 33 min Charges:  OT General Charges $OT Visit: 1 Visit OT Evaluation $OT Eval Low Complexity: 1 Low OT Treatments $Self Care/Home Management : 8-22 mins  Martie Round, OTR/L Acute Rehabilitation Services Pager: [phone] Office: [phone]  Evern Bio 03/03/2021, 10:36 AM

## 2021-03-03 NOTE — Plan of Care (Signed)

## 2021-03-03 NOTE — Evaluation (Signed)
Physical Therapy Evaluation Patient Details Name: Brian Dodson MRN: [id number] DOB: 16-Feb-1956 Today's Date: 03/03/2021   History of Present Illness  Pt is a 65 year old man admitted from [facility] with garbled speech and difficulty ambulating. 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 PMH: dementia, PAF, DM, CAD s/p stenting, HTN, COPD, asthma, schizophrenia, BPH, chronic anemia.    Clinical Impression  Pt admitted with above diagnosis and subsequent problems. Pleasantly confused and able to follow commands with increased time. Generally supervision to min guard for mobility. Steady without an AD, but shuffle-like gait pattern with narrow BOS. Cueing to take larger steps. Pt would benefit from PT to address gait and higher level balance activities, decrease risk for falls, and increase independency. Recommend return to ALF due to cognition. Will continue to follow acutely.    Follow Up Recommendations Other (comment);Supervision for mobility/OOB;Home health PT (Return to ALF with available therapies)    Equipment Recommendations  None recommended by PT    Recommendations for Other Services       Precautions / Restrictions Precautions Precautions: Fall Restrictions Weight Bearing Restrictions: No      Mobility  Bed Mobility Overal bed mobility: Modified Independent             General bed mobility comments: Received sitting in recliner    Transfers Overall transfer level: Needs assistance Equipment used: None Transfers: Sit to/from Stand Sit to Stand: Supervision         General transfer comment: slow to rise, no LOB  Ambulation/Gait Ambulation/Gait assistance: Supervision Gait Distance (Feet): 110 Feet Assistive device: None;IV Pole Gait Pattern/deviations: Step-through pattern;Shuffle;Decreased step length - left;Decreased stance time - right;Narrow base of support Gait velocity: decreased   General Gait  Details: Supervision for safety. No overt LOB.  Stairs            Wheelchair Mobility    Modified Rankin (Stroke Patients Only)       Balance Overall balance assessment: Needs assistance   Sitting balance-Leahy Scale: Good       Standing balance-Leahy Scale: Good                               Pertinent Vitals/Pain Pain Assessment: No/denies pain    Home Living Family/patient expects to be discharged to:: Assisted living                      Prior Function Level of Independence: Needs assistance   Gait / Transfers Assistance Needed: walks without a device  ADL's / Homemaking Assistance Needed: showers in sitting, dresses and feeds himself, facility manages IADL        Hand Dominance   Dominant Hand: Right    Extremity/Trunk Assessment   Upper Extremity Assessment Upper Extremity Assessment: Defer to OT evaluation    Lower Extremity Assessment Lower Extremity Assessment: Overall WFL for tasks assessed    Cervical / Trunk Assessment Cervical / Trunk Assessment: Normal  Communication   Communication: No difficulties  Cognition Arousal/Alertness: Awake/alert Behavior During Therapy: WFL for tasks assessed/performed Overall Cognitive Status: History of cognitive impairments - at baseline                                 General Comments: Pleasantly confused. A&Ox1. Disoriented to place (stated [place] or [place]), situation, and time (2002). Stated  his birthday [birth year], but it is [birth year]. Unable to remember or clearly state where he was living prior to admission.      General Comments      Exercises     Assessment/Plan    PT Assessment Patient needs continued PT services  PT Problem List Decreased strength;Decreased activity tolerance;Decreased safety awareness;Decreased balance;Decreased mobility       PT Treatment Interventions DME instruction;Balance training;Gait training;Neuromuscular  re-education;Functional mobility training;Patient/family education;Therapeutic activities;Therapeutic exercise;Stair training    PT Goals (Current goals can be found in the Care Plan section)  Acute Rehab PT Goals Patient Stated Goal: go to heaven to see his mother PT Goal Formulation: With patient    Frequency Min 3X/week   Barriers to discharge        Co-evaluation               AM-PAC PT "6 Clicks" Mobility  Outcome Measure Help needed turning from your back to your side while in a flat bed without using bedrails?: None Help needed moving from lying on your back to sitting on the side of a flat bed without using bedrails?: None Help needed moving to and from a bed to a chair (including a wheelchair)?: A Little Help needed standing up from a chair using your arms (e.g., wheelchair or bedside chair)?: A Little Help needed to walk in hospital room?: A Little Help needed climbing 3-5 steps with a railing? : A Little 6 Click Score: 20    End of Session Equipment Utilized During Treatment: Gait belt Activity Tolerance: Patient tolerated treatment well Patient left: in chair;with call bell/phone within reach;with chair alarm set Nurse Communication: Mobility status PT Visit Diagnosis: Unsteadiness on feet (R26.81)    Time:  -      Charges:             Conley Rolls, SPT

## 2021-03-03 NOTE — Progress Notes (Signed)
PROGRESS NOTE    Brian Dodson  OQH:[id number] DOB: [birth year] DOA: 03/02/2021 PCP: [name], MD     Brief Narrative:  Brian Dodson is a 65 y.o. BM PMHx Dementia, schizophrenia, loss of memory, paroxysmal atrial fibrillation, CAD s/p stent, essential HTN DM type II  Found to be increasingly confused with garbled speech and difficulty ambulating and was brought to the ER.  Most of the history was obtained from ER physician as patient appears confused.  Family not at bedside.  ED Course: In the ER patient had a fever 103 F.  UA is concerning for UTI.  Covid test was negative.  CT of the head was unremarkable.  On-call neurology was consulted since there was some concern for possible stroke given the garbled speech.  Neurology recommended MRI brain and if positive for stroke then further stroke work-up.  Patient had blood cultures drawn and started on empiric antibiotics.  Chest x-ray was unremarkable Covid test was negative.  EKG shows atrial flutter rate controlled.   Subjective: T-max overnight 39.2 C, A/O x2 (does not know where, why) patient does know that he is in [place].  Very pleasant follows all commands.  Unsure of patient's baseline but he appears to have some baseline cognitive deficit.   Assessment & Plan: Covid vaccination; patient unsure   Principal Problem:   Acute encephalopathy Active Problems:   Schizophrenia ([place])   Diabetes mellitus, type II ([place])   HTN (hypertension)   CAD (coronary artery disease)   Anemia   Loss of memory   Acute lower UTI   Unspecified atrial fibrillation ([place])    Sepsis - On admission patient met criteria for sepsis WBC> 12, temp> 38 C, HR> 90, RR> 20.  Unknown source.  UTI? -Will trend lactic acid and procalcitonin - Normal saline 30ml/hr  UTI? - Empirically treating for UTI - Urine culture pending  Acute encephalopathy vs baseline dementia/schizophrenia/memory loss. - Patient currently follows all commands,  pleasant but appears to have an underlying cognitive deficit. - We will need to contact family members to determine patient's baseline. -Brain MRI negative for acute infarct see results below - Benztropine 1 mg daily - Celexa 20 mg daily - Depakote 2000 mg daily - Donepezil 10 mg daily - Risperidone 4 mg daily   Hx CVA - See MRI results below  Atrial fibrillation/flutter - MRI negative for acute infarct restart Eliquis 5 mg BID - Currently NSR - Echocardiogram pending - Amiodarone 100 mg daily - Amlodipine 5 mg daily - Losartan 25 mg daily  Essential HTN - See A. Fib  CAD - See A. fib - Lipid panel pending  DM type II controlled without complication - 5/46 hemoglobin A1c= 6.2 - Sensitive SSI  Chronic anemia -Anemia panel pending - Occult blood pending - Currently no obvious sites of blood loss   Obese (BMI 29.48 kg/m)     DVT prophylaxis: Apixaban Code Status: Full Family Communication:  Status is: Inpatient    Dispo: The patient is from: Home              Anticipated d/c is to: Home              Anticipated d/c date is: 4/18              Patient currently unstable      Consultants:    Procedures/Significant Events:  4/15 MRI brain W0 contrast:No acute intracranial abnormality. -Old left basal ganglia small vessel infarct and findings of  chronic ischemic microangiopathy.  I have personally reviewed and interpreted all radiology studies and my findings are as above.  VENTILATOR SETTINGS: Room air 4/15 SPO2 96%   Cultures 4/14 SARS coronavirus negative 4/14 influenza A/B negative 4/14 blood pending 4/14 urine pending   Antimicrobials: Anti-infectives (From admission, onward)   Start     Ordered Stop   03/02/21 2000  cefTRIAXone (ROCEPHIN) 1 g in sodium chloride 0.9 % 100 mL IVPB        03/02/21 1932     03/02/21 1800  cefTRIAXone (ROCEPHIN) 1 g in sodium chloride 0.9 % 100 mL IVPB  Status:  Discontinued        03/02/21 1745 03/02/21  1941       Devices    LINES / TUBES:      Continuous Infusions: . sodium chloride 1,000 mL (03/03/21 1207)  . cefTRIAXone (ROCEPHIN)  IV Stopped (03/02/21 2059)     Objective: Vitals:   03/03/21 0352 03/03/21 0553 03/03/21 0805 03/03/21 1236  BP: 108/80 126/82 104/76 102/90  Pulse: 63 64 71 98  Resp: $Remo'14 18 16 16  'ZfIJz$ Temp: 98.5 F (36.9 C) 98.6 F (37 C) 98.6 F (37 C) 98.1 F (36.7 C)  TempSrc: Oral Oral Oral Oral  SpO2: 100% 100% 96% 98%  Weight:      Height:        Intake/Output Summary (Last 24 hours) at 03/03/2021 1712 Last data filed at 03/03/2021 1511 Gross per 24 hour  Intake 1281.46 ml  Output --  Net 1281.46 ml   Filed Weights   03/02/21 1400 03/02/21 2208  Weight: 102.8 kg 98.6 kg    Examination:  General: A/O x2 (does not know where, why) does know that he is in [place].  Follows all commands., No acute respiratory distress Eyes: negative scleral hemorrhage, negative anisocoria, negative icterus ENT: Negative Runny nose, negative gingival bleeding, Neck:  Negative scars, masses, torticollis, lymphadenopathy, JVD Lungs: Clear to auscultation bilaterally without wheezes or crackles Cardiovascular: Regular rate and rhythm without murmur gallop or rub normal S1 and S2 Abdomen: negative abdominal pain, nondistended, positive soft, bowel sounds, no rebound, no ascites, no appreciable mass Extremities: No significant cyanosis, clubbing, or edema bilateral lower extremities Skin: Negative rashes, lesions, ulcers Psychiatric:  Negative depression, negative anxiety, negative fatigue, negative mania  Central nervous system:  Cranial nerves II through XII intact, tongue/uvula midline, all extremities muscle strength 5/5, sensation intact throughout, negative dysarthria, negative expressive aphasia, negative receptive aphasia.  .     Data Reviewed: Care during the described time interval was provided by me .  I have reviewed this patient's available data,  including medical history, events of note, physical examination, and all test results as part of my evaluation.  CBC: Recent Labs  Lab 03/02/21 1444 03/02/21 1450 03/03/21 0904  WBC 13.0*  --  15.8*  NEUTROABS 10.6*  --  11.9*  HGB 12.3* 12.6* 11.5*  HCT 37.2* 37.0* 35.0*  MCV 98.4  --  98.6  PLT 163  --  415*   Basic Metabolic Panel: Recent Labs  Lab 03/02/21 1444 03/02/21 1450 03/03/21 0904  NA 135 138 135  K 4.7 4.6 4.0  CL 101 102 104  CO2 25  --  25  GLUCOSE 105* 103* 124*  BUN $Re'17 20 16  'iPP$ CREATININE 1.17 1.10 1.04  CALCIUM 9.1  --  8.7*  MG  --   --  1.9  PHOS  --   --  2.4*   GFR:  Estimated Creatinine Clearance: 86.1 mL/min (by C-G formula based on SCr of 1.04 mg/dL). Liver Function Tests: Recent Labs  Lab 03/02/21 1444 03/03/21 0904  AST 22 21  ALT 12 12  ALKPHOS 53 48  BILITOT 0.3 0.5  PROT 6.7 6.0*  ALBUMIN 3.6 2.8*   No results for input(s): LIPASE, AMYLASE in the last 168 hours. No results for input(s): AMMONIA in the last 168 hours. Coagulation Profile: Recent Labs  Lab 03/02/21 1444  INR 1.1   Cardiac Enzymes: No results for input(s): CKTOTAL, CKMB, CKMBINDEX, TROPONINI in the last 168 hours. BNP (last 3 results) No results for input(s): PROBNP in the last 8760 hours. HbA1C: Recent Labs    03/02/21 1533  HGBA1C 6.2*   CBG: Recent Labs  Lab 03/02/21 1443 03/03/21 0559 03/03/21 1156 03/03/21 1613  GLUCAP 107* 81 159* 130*   Lipid Profile: Recent Labs    03/02/21 1556  CHOL 150  HDL 64  LDLCALC 80  TRIG 28  CHOLHDL 2.3   Thyroid Function Tests: No results for input(s): TSH, T4TOTAL, FREET4, T3FREE, THYROIDAB in the last 72 hours. Anemia Panel: No results for input(s): VITAMINB12, FOLATE, FERRITIN, TIBC, IRON, RETICCTPCT in the last 72 hours. Sepsis Labs: Recent Labs  Lab 03/02/21 1537 03/03/21 0904 03/03/21 1125  PROCALCITON  --  0.30  --   LATICACIDVEN 1.5 1.4 1.4    Recent Results (from the past 240 hour(s))   Resp Panel by RT-PCR (Flu A&B, Covid) Urine, Clean Catch     Status: None   Collection Time: 03/02/21  3:38 PM   Specimen: Urine, Clean Catch; Nasopharyngeal(NP) swabs in vial transport medium  Result Value Ref Range Status   SARS Coronavirus 2 by RT PCR NEGATIVE NEGATIVE Final    Comment: (NOTE) SARS-CoV-2 target nucleic acids are NOT DETECTED.  The SARS-CoV-2 RNA is generally detectable in upper respiratory specimens during the acute phase of infection. The lowest concentration of SARS-CoV-2 viral copies this assay can detect is 138 copies/mL. A negative result does not preclude SARS-Cov-2 infection and should not be used as the sole basis for treatment or other patient management decisions. A negative result may occur with  improper specimen collection/handling, submission of specimen other than nasopharyngeal swab, presence of viral mutation(s) within the areas targeted by this assay, and inadequate number of viral copies(<138 copies/mL). A negative result must be combined with clinical observations, patient history, and epidemiological information. The expected result is Negative.  Fact Sheet for Patients:  EntrepreneurPulse.com.au  Fact Sheet for Healthcare Providers:  IncredibleEmployment.be  This test is no t yet approved or cleared by the [place] FDA and  has been authorized for detection and/or diagnosis of SARS-CoV-2 by FDA under an Emergency Use Authorization (EUA). This EUA will remain  in effect (meaning this test can be used) for the duration of the COVID-19 declaration under Section 564(b)(1) of the Act, 21 U.S.C.section 360bbb-3(b)(1), unless the authorization is terminated  or revoked sooner.       Influenza A by PCR NEGATIVE NEGATIVE Final   Influenza B by PCR NEGATIVE NEGATIVE Final    Comment: (NOTE) The Xpert Xpress SARS-CoV-2/FLU/RSV plus assay is intended as an aid in the diagnosis of influenza from  Nasopharyngeal swab specimens and should not be used as a sole basis for treatment. Nasal washings and aspirates are unacceptable for Xpert Xpress SARS-CoV-2/FLU/RSV testing.  Fact Sheet for Patients: EntrepreneurPulse.com.au  Fact Sheet for Healthcare Providers: IncredibleEmployment.be  This test is not yet approved or cleared by  the [employer] and has been authorized for detection and/or diagnosis of SARS-CoV-2 by FDA under an Emergency Use Authorization (EUA). This EUA will remain in effect (meaning this test can be used) for the duration of the COVID-19 declaration under Section 564(b)(1) of the Act, 21 U.S.C. section 360bbb-3(b)(1), unless the authorization is terminated or revoked.  Performed at [facility], [place] [address], [place], [place] [address]   Blood culture (routine x 2)     Status: None (Preliminary result)   Collection Time: 03/02/21  3:55 PM   Specimen: BLOOD  Result Value Ref Range Status   Specimen Description BLOOD SITE NOT SPECIFIED  Final   Special Requests   Final    BOTTLES DRAWN AEROBIC AND ANAEROBIC Blood Culture results may not be optimal due to an inadequate volume of blood received in culture bottles   Culture   Final    NO GROWTH < 24 HOURS Performed at [facility], [place] [address] [place], [place] [address]    Report Status PENDING  Incomplete  Blood culture (routine x 2)     Status: None (Preliminary result)   Collection Time: 03/02/21  3:56 PM   Specimen: BLOOD  Result Value Ref Range Status   Specimen Description BLOOD SITE NOT SPECIFIED  Final   Special Requests   Final    BOTTLES DRAWN AEROBIC AND ANAEROBIC Blood Culture adequate volume   Culture   Final    NO GROWTH < 24 HOURS Performed at [facility], [place] [address] [place], [place] [address]    Report Status PENDING  Incomplete         Radiology Studies: DG Abd 1 View  Result Date: 03/02/2021 CLINICAL DATA:   KUB to rule out foreign body, code stroke EXAM: ABDOMEN - 1 VIEW COMPARISON:  CT renal colic 62/01/5596 FINDINGS: No radiodense metallic foreign body seen within the imaged field of the abdomen. Diffusely air-filled though nonobstructive appearing loops of bowel throughout the abdomen are nonspecific. No suspicious abdominal calcifications. No other acute abdominal process. Degenerative changes in the spine, hips and pelvis is imaged. IMPRESSION: No radiodense metallic foreign body seen. Electronically Signed   By: Lovena Le M.D.   On: 03/02/2021 19:42   MR BRAIN WO CONTRAST  Result Date: 03/03/2021 CLINICAL DATA:  Generalized weakness with altered speech EXAM: MRI HEAD WITHOUT CONTRAST TECHNIQUE: Multiplanar, multiecho pulse sequences of the brain and surrounding structures were obtained without intravenous contrast. COMPARISON:  None. FINDINGS: Brain: No acute infarct, mass effect or extra-axial collection. Remote hemorrhage in the right cerebellum. There is multifocal hyperintense T2-weighted signal within the white matter. Parenchymal volume and CSF spaces are normal. Old left basal ganglia small vessel infarct. The midline structures are normal. Vascular: Major flow voids are preserved. Skull and upper cervical spine: Normal calvarium and skull base. Visualized upper cervical spine and soft tissues are normal. Sinuses/Orbits:No paranasal sinus fluid levels or advanced mucosal thickening. No mastoid or middle ear effusion. Normal orbits. IMPRESSION: 1. No acute intracranial abnormality. 2. Old left basal ganglia small vessel infarct and findings of chronic ischemic microangiopathy. Electronically Signed   By: Ulyses Jarred M.D.   On: 03/03/2021 03:27   DG Pelvis Portable  Result Date: 03/03/2021 CLINICAL DATA:  Pelvic pain EXAM: PORTABLE PELVIS 1-2 VIEWS COMPARISON:  None. FINDINGS: Normal alignment. Transitional lumbar anatomy with partial sacralization of L5. No acute fracture or dislocation.  Sacroiliac and hip joint spaces are preserved. Vascular calcifications are seen  within the pelvis and medial right thigh. IMPRESSION: No acute fracture or dislocation. Electronically Signed   By: Fidela Salisbury MD   On: 03/03/2021 01:34   DG Chest Port 1 View  Result Date: 03/02/2021 CLINICAL DATA:  Fever. Code stroke. EXAM: PORTABLE CHEST 1 VIEW COMPARISON:  November 18, 2018 FINDINGS: Tortuosity of the thoracic aorta. Prominence of the aortic knob, which may be exaggerated by AP technique. Cardiomediastinal silhouette is normal. Mediastinal contours appear intact. There is no evidence of focal airspace consolidation, pleural effusion or pneumothorax. Osseous structures are without acute abnormality. Soft tissues are grossly normal. IMPRESSION: 1. Tortuosity of the thoracic aorta. 2. Prominence of the aortic knob, which may be exaggerated by AP technique. If any clinical concern for acute aortic syndrome, evaluation with CT of the chest should be considered. Electronically Signed   By: Fidela Salisbury M.D.   On: 03/02/2021 15:58   CT HEAD CODE STROKE WO CONTRAST  Result Date: 03/02/2021 CLINICAL DATA:  Code stroke.  Acute neuro deficit.  Abnormal speech EXAM: CT HEAD WITHOUT CONTRAST TECHNIQUE: Contiguous axial images were obtained from the base of the skull through the vertex without intravenous contrast. COMPARISON:  CT head 01/29/2014 FINDINGS: Brain: Mild atrophy. Mild to moderate white matter changes with patchy white matter hypodensity bilaterally. Progression since 2015 Negative for acute infarct, hemorrhage, mass. Vascular: Negative for hyperdense vessel Skull: Negative Sinuses/Orbits: Minimal mucosal edema paranasal sinuses. Negative orbit Other: None ASPECTS ([place] Stroke Program Early CT Score) - Ganglionic level infarction (caudate, lentiform nuclei, internal capsule, insula, M1-M3 cortex): 7 - Supraganglionic infarction (M4-M6 cortex): 3 Total score (0-10 with 10 being normal): 10  IMPRESSION: 1. No acute abnormality 2. Mild atrophy and mild to moderate white matter changes most consistent with chronic microvascular ischemia with progression since 2015. 3. ASPECTS is 10 4. Code stroke imaging results were communicated on 03/02/2021 at 2:57 pm to provider Leonel Ramsay via text page Electronically Signed   By: Franchot Gallo M.D.   On: 03/02/2021 14:58        Scheduled Meds: . amiodarone  100 mg Oral Daily  . amLODipine  5 mg Oral Daily  . apixaban  5 mg Oral BID  . atorvastatin  10 mg Oral Daily  . benztropine  1 mg Oral QHS  . brimonidine  1 drop Both Eyes BID   And  . timolol  1 drop Both Eyes BID  . calcium carbonate  500 mg of elemental calcium Oral BID WC  . cholecalciferol  50 mcg Oral Daily  . citalopram  20 mg Oral QHS  . divalproex  2,000 mg Oral QHS  . donepezil  10 mg Oral QHS  . ferrous sulfate  325 mg Oral Q breakfast  . insulin aspart  0-9 Units Subcutaneous TID WC  . latanoprost  1 drop Both Eyes QHS  . losartan  25 mg Oral Daily  . melatonin  5 mg Oral QHS  . pantoprazole  40 mg Oral Daily  . risperidone  4 mg Oral QHS   Continuous Infusions: . sodium chloride 1,000 mL (03/03/21 1207)  . cefTRIAXone (ROCEPHIN)  IV Stopped (03/02/21 2059)     LOS: 0 days    Time spent:40 min    Antone Summons, Geraldo Docker, MD [facility]   If 7PM-7AM, please contact night-coverage 03/03/2021, 5:12 PM

## 2021-03-04 DIAGNOSIS — D638 Anemia in other chronic diseases classified elsewhere: Secondary | ICD-10-CM

## 2021-03-04 DIAGNOSIS — R531 Weakness: Secondary | ICD-10-CM

## 2021-03-04 LAB — RETICULOCYTES
Immature Retic Fract: 16.1 % — ABNORMAL HIGH (ref 2.3–15.9)
RBC.: 3.18 MIL/uL — ABNORMAL LOW (ref 4.22–5.81)
Retic Count, Absolute: 41 10*3/uL (ref 19.0–186.0)
Retic Ct Pct: 1.3 % (ref 0.4–3.1)

## 2021-03-04 LAB — LIPID PANEL
Cholesterol: 129 mg/dL (ref 0–200)
HDL: 59 mg/dL (ref >40)
LDL Cholesterol: 64 mg/dL (ref 0–99)
Total CHOL/HDL Ratio: 2.2 RATIO
Triglycerides: 31 mg/dL (ref <150)
VLDL: 6 mg/dL (ref 0–40)

## 2021-03-04 LAB — CBC WITH DIFFERENTIAL/PLATELET
Abs Immature Granulocytes: 0.08 10*3/uL — ABNORMAL HIGH (ref 0.00–0.07)
Basophils Absolute: 0 10*3/uL (ref 0.0–0.1)
Basophils Relative: 0 %
Eosinophils Absolute: 0.2 10*3/uL (ref 0.0–0.5)
Eosinophils Relative: 1 %
HCT: 32.5 % — ABNORMAL LOW (ref 39.0–52.0)
Hemoglobin: 10.7 g/dL — ABNORMAL LOW (ref 13.0–17.0)
Immature Granulocytes: 1 %
Lymphocytes Relative: 35 %
Lymphs Abs: 4.4 10*3/uL — ABNORMAL HIGH (ref 0.7–4.0)
MCH: 32.3 pg (ref 26.0–34.0)
MCHC: 32.9 g/dL (ref 30.0–36.0)
MCV: 98.2 fL (ref 80.0–100.0)
Monocytes Absolute: 1 10*3/uL (ref 0.1–1.0)
Monocytes Relative: 7 %
Neutro Abs: 7.2 10*3/uL (ref 1.7–7.7)
Neutrophils Relative %: 56 %
Platelets: 138 10*3/uL — ABNORMAL LOW (ref 150–400)
RBC: 3.31 MIL/uL — ABNORMAL LOW (ref 4.22–5.81)
RDW: 13.6 % (ref 11.5–15.5)
WBC: 12.8 10*3/uL — ABNORMAL HIGH (ref 4.0–10.5)
nRBC: 0 % (ref 0.0–0.2)

## 2021-03-04 LAB — GLUCOSE, CAPILLARY
Glucose-Capillary: 101 mg/dL — ABNORMAL HIGH (ref 70–99)
Glucose-Capillary: 112 mg/dL — ABNORMAL HIGH (ref 70–99)
Glucose-Capillary: 134 mg/dL — ABNORMAL HIGH (ref 70–99)
Glucose-Capillary: 148 mg/dL — ABNORMAL HIGH (ref 70–99)

## 2021-03-04 LAB — COMPREHENSIVE METABOLIC PANEL
ALT: 12 U/L (ref 0–44)
AST: 19 U/L (ref 15–41)
Albumin: 2.7 g/dL — ABNORMAL LOW (ref 3.5–5.0)
Alkaline Phosphatase: 55 U/L (ref 38–126)
Anion gap: 7 (ref 5–15)
BUN: 17 mg/dL (ref 8–23)
CO2: 26 mmol/L (ref 22–32)
Calcium: 8.8 mg/dL — ABNORMAL LOW (ref 8.9–10.3)
Chloride: 103 mmol/L (ref 98–111)
Creatinine, Ser: 1.08 mg/dL (ref 0.61–1.24)
GFR, Estimated: >60 mL/min (ref >60)
Glucose, Bld: 167 mg/dL — ABNORMAL HIGH (ref 70–99)
Potassium: 4.4 mmol/L (ref 3.5–5.1)
Sodium: 136 mmol/L (ref 135–145)
Total Bilirubin: <0.1 mg/dL — ABNORMAL LOW (ref 0.3–1.2)
Total Protein: 5.5 g/dL — ABNORMAL LOW (ref 6.5–8.1)

## 2021-03-04 LAB — IRON AND TIBC
Iron: 37 ug/dL — ABNORMAL LOW (ref 45–182)
Saturation Ratios: 13 % — ABNORMAL LOW (ref 17.9–39.5)
TIBC: 291 ug/dL (ref 250–450)
UIBC: 254 ug/dL

## 2021-03-04 LAB — PHOSPHORUS: Phosphorus: 2.8 mg/dL (ref 2.5–4.6)

## 2021-03-04 LAB — VITAMIN B12: Vitamin B-12: 372 pg/mL (ref 180–914)

## 2021-03-04 LAB — PROCALCITONIN: Procalcitonin: 0.23 ng/mL

## 2021-03-04 LAB — MAGNESIUM: Magnesium: 2 mg/dL (ref 1.7–2.4)

## 2021-03-04 LAB — FOLATE: Folate: 20 ng/mL (ref >5.9)

## 2021-03-04 LAB — FERRITIN: Ferritin: 85 ng/mL (ref 24–336)

## 2021-03-04 MED ORDER — SODIUM CHLORIDE 0.9 % IV SOLN: INTRAVENOUS | Status: DC

## 2021-03-04 NOTE — Discharge Instructions (Signed)

## 2021-03-04 NOTE — TOC Initial Note (Signed)
Transition of Care [facility]) - Initial/Assessment Note    Patient Details  Name: Brian Dodson MRN: [id number] Date of Birth: 1955/11/25  [facility] CM/SW Contact:    Annalee Genta, LCSW Phone Number: 03/04/2021, 4:40 PM  Clinical Narrative:  CSW contacted by MD patient may be medically ready to return to his group home. CSW reviewed and noted patient has been a long-term resident at [facility]. CSW reached the RN there at [phone] and confirmed patient would be able to return tomorrow when cleared and would just need to be notified when he was ready to be transport. CSW will continue to follow.                 Expected Discharge Plan: Assisted Living Barriers to Discharge: Continued Medical Work up   Patient Goals and CMS Choice        Expected Discharge Plan and Services Expected Discharge Plan: Assisted Living                                              Prior Living Arrangements/Services                       Activities of Daily Living Home Assistive Devices/Equipment: Dentures (specify type) ADL Screening (condition at time of admission) Patient's cognitive ability adequate to safely complete daily activities?: No Is the patient deaf or have difficulty hearing?: No Does the patient have difficulty seeing, even when wearing glasses/contacts?: No Does the patient have difficulty concentrating, remembering, or making decisions?: Yes Patient able to express need for assistance with ADLs?: Yes Does the patient have difficulty dressing or bathing?: Yes Independently performs ADLs?: Yes (appropriate for developmental age) Does the patient have difficulty walking or climbing stairs?: Yes Weakness of Legs: Both Weakness of Arms/Hands: None  Permission Sought/Granted                  Emotional Assessment              Admission diagnosis:  Slurred speech [R47.81] Acute febrile illness [R50.9] Acute  encephalopathy [G93.40] Generalized weakness [R53.1] Patient Active Problem List   Diagnosis Date Noted  . Acute encephalopathy 03/02/2021  . Acute lower UTI 03/02/2021  . Unspecified atrial fibrillation (HCC) 03/02/2021  . Acute febrile illness   . Bilateral lower extremity edema 04/01/2018  . Lower extremity pain, bilateral 04/01/2018  . Swelling of limb 10/04/2017  . Sepsis (HCC) 05/20/2017  . Anemia 05/07/2017  . B12 deficiency 05/07/2017  . Loss of memory 05/07/2017  . Atrial fibrillation with RVR (HCC) 05/23/2015  . Syncope 05/23/2015  . Schizophrenia (HCC) 05/23/2015  . Diabetes mellitus, type II (HCC) 05/23/2015  . HTN (hypertension) 05/23/2015  . GERD (gastroesophageal reflux disease) 05/23/2015  . CAD (coronary artery disease) 05/23/2015   PCP:  Galvin Proffer, MD Pharmacy:   [facility] [address] [place], [place] - [address] [place] [address] Phone: [phone] Fax: [phone]  [facility] - [place], [place] - [address] [place] [address] Phone: [phone] Fax: [phone]     Social Determinants of Health (SDOH) Interventions    Readmission Risk Interventions No flowsheet data found.

## 2021-03-04 NOTE — Progress Notes (Signed)
PROGRESS NOTE    Brian Dodson  SWN:[id number] DOB: 1956/06/22 DOA: 03/02/2021 PCP: [name], MD     Brief Narrative:  Brian Dodson is a 65 y.o. BM PMHx Dementia, schizophrenia, loss of memory, paroxysmal atrial fibrillation, CAD s/p stent, essential HTN DM type II  Found to be increasingly confused with garbled speech and difficulty ambulating and was brought to the ER.  Most of the history was obtained from ER physician as patient appears confused.  Family not at bedside.  ED Course: In the ER patient had a fever 103 F.  UA is concerning for UTI.  Covid test was negative.  CT of the head was unremarkable.  On-call neurology was consulted since there was some concern for possible stroke given the garbled speech.  Neurology recommended MRI brain and if positive for stroke then further stroke work-up.  Patient had blood cultures drawn and started on empiric antibiotics.  Chest x-ray was unremarkable Covid test was negative.  EKG shows atrial flutter rate controlled.   Subjective: Afebrile overnight A/O x2 (does not know where, why).  Patient does know that he is in [place].  Very pleasant follows all commands.  Per his niece he is at baseline.   Assessment & Plan: Covid vaccination; patient unsure   Principal Problem:   Acute encephalopathy Active Problems:   Schizophrenia ([place])   Diabetes mellitus, type II ([place])   HTN (hypertension)   CAD (coronary artery disease)   Anemia   Loss of memory   Acute lower UTI   Unspecified atrial fibrillation (HCC)   Anemia of chronic disease    Sepsis - On admission patient met criteria for sepsis WBC> 12, temp> 38 C, HR> 90, RR> 20.  Unknown source.  UTI? -Will trend lactic acid and procalcitonin Results for SIE, FORMISANO (MRN [id number]) as of 03/04/2021 16:02  Ref. Range 03/03/2021 09:04 03/04/2021 01:26  Procalcitonin Latest Units: ng/mL 0.30 0.23  - Normal saline 39ml/hr  UTI? - Empirically treating for UTI - Urine  culture pending - WBC trending down.  If normalized in the a.m. will DC on PO antibiotics, to finish 5-day course  Acute encephalopathy vs baseline dementia/schizophrenia/memory loss. - Patient currently follows all commands, pleasant but appears to have an underlying cognitive deficit. - We will need to contact family members to determine patient's baseline. -Brain MRI negative for acute infarct see results below - Benztropine 1 mg daily - Celexa 20 mg daily - Depakote 2000 mg daily - Donepezil 10 mg daily - Risperidone 4 mg daily   Hx CVA - See MRI results below  Atrial fibrillation/flutter - MRI negative for acute infarct restart Eliquis 5 mg BID - Currently NSR - Echocardiogram pending - Amiodarone 100 mg daily - Amlodipine 5 mg daily - Losartan 25 mg daily  Essential HTN - See A. Fib  CAD - See A. fib - Lipid panel pending  DM type II controlled without complication - 7/16 hemoglobin A1c= 6.2 - Sensitive SSI  Anemia of chronic disease -Anemia panel most consistent with anemia of chronic disease - Occult blood pending - Currently no obvious sites of blood loss   Obese (BMI 29.48 kg/m)     DVT prophylaxis: Apixaban Code Status: Full Family Communication: 4/16 niece at bedside discussed plan of care answered all questions. . Status is: Inpatient    Dispo: The patient is from: Group home              Anticipated d/c is to: Group  home              Anticipated d/c date is: 4/18              Patient currently unstable      Consultants:    Procedures/Significant Events:  4/15 MRI brain W0 contrast:No acute intracranial abnormality. -Old left basal ganglia small vessel infarct and findings of chronic ischemic microangiopathy. 4/15 echocardiogram:Left Ventricle: LVEF=50 to 55%.  -Left ventricular diastolic function could not be evaluated due to atrial fibrillation.  Left Atrium:  moderately dilated.     I have personally reviewed and interpreted  all radiology studies and my findings are as above.  VENTILATOR SETTINGS: Room air 4/16 SPO2 99%   Cultures 4/14 SARS coronavirus negative 4/14 influenza A/B negative 4/14 blood pending 4/14 urine pending   Antimicrobials: Anti-infectives (From admission, onward)   Start     Ordered Stop   03/02/21 2000  cefTRIAXone (ROCEPHIN) 1 g in sodium chloride 0.9 % 100 mL IVPB        03/02/21 1932     03/02/21 1800  cefTRIAXone (ROCEPHIN) 1 g in sodium chloride 0.9 % 100 mL IVPB  Status:  Discontinued        03/02/21 1745 03/02/21 1941       Devices    LINES / TUBES:      Continuous Infusions: . sodium chloride    . cefTRIAXone (ROCEPHIN)  IV 1 g (03/03/21 2030)     Objective: Vitals:   03/04/21 0433 03/04/21 0959 03/04/21 1217 03/04/21 1825  BP: 109/71 111/70 94/65 109/65  Pulse: 63 65 89 65  Resp: $Remo'18  16 16  'tOAyy$ Temp: 97.6 F (36.4 C)  97.8 F (36.6 C) 97.6 F (36.4 C)  TempSrc: Oral  Oral Oral  SpO2: 97%  100% 99%  Weight:      Height:        Intake/Output Summary (Last 24 hours) at 03/04/2021 1930 Last data filed at 03/04/2021 1500 Gross per 24 hour  Intake 1300 ml  Output 1320 ml  Net -20 ml   Filed Weights   03/02/21 1400 03/02/21 2208  Weight: 102.8 kg 98.6 kg    Examination:  General: A/O x2 (does not know where, why) does know that he is in [place].  Follows all commands., No acute respiratory distress Eyes: negative scleral hemorrhage, negative anisocoria, negative icterus ENT: Negative Runny nose, negative gingival bleeding, Neck:  Negative scars, masses, torticollis, lymphadenopathy, JVD Lungs: Clear to auscultation bilaterally without wheezes or crackles Cardiovascular: Regular rate and rhythm without murmur gallop or rub normal S1 and S2 Abdomen: negative abdominal pain, nondistended, positive soft, bowel sounds, no rebound, no ascites, no appreciable mass Extremities: No significant cyanosis, clubbing, or edema bilateral lower  extremities Skin: Negative rashes, lesions, ulcers Psychiatric:  Negative depression, negative anxiety, negative fatigue, negative mania  Central nervous system:  Cranial nerves II through XII intact, tongue/uvula midline, all extremities muscle strength 5/5, sensation intact throughout, negative dysarthria, negative expressive aphasia, negative receptive aphasia.  .     Data Reviewed: Care during the described time interval was provided by me .  I have reviewed this patient's available data, including medical history, events of note, physical examination, and all test results as part of my evaluation.  CBC: Recent Labs  Lab 03/02/21 1444 03/02/21 1450 03/03/21 0904 03/04/21 0126  WBC 13.0*  --  15.8* 12.8*  NEUTROABS 10.6*  --  11.9* 7.2  HGB 12.3* 12.6* 11.5* 10.7*  HCT 37.2* 37.0*  35.0* 32.5*  MCV 98.4  --  98.6 98.2  PLT 163  --  144* 694*   Basic Metabolic Panel: Recent Labs  Lab 03/02/21 1444 03/02/21 1450 03/03/21 0904 03/04/21 0126  NA 135 138 135 136  K 4.7 4.6 4.0 4.4  CL 101 102 104 103  CO2 25  --  25 26  GLUCOSE 105* 103* 124* 167*  BUN $Re'17 20 16 17  'iGo$ CREATININE 1.17 1.10 1.04 1.08  CALCIUM 9.1  --  8.7* 8.8*  MG  --   --  1.9 2.0  PHOS  --   --  2.4* 2.8   GFR: Estimated Creatinine Clearance: 82.9 mL/min (by C-G formula based on SCr of 1.08 mg/dL). Liver Function Tests: Recent Labs  Lab 03/02/21 1444 03/03/21 0904 03/04/21 0126  AST $Re'22 21 19  'FZy$ ALT $R'12 12 12  'NR$ ALKPHOS 53 48 55  BILITOT 0.3 0.5 <0.1*  PROT 6.7 6.0* 5.5*  ALBUMIN 3.6 2.8* 2.7*   No results for input(s): LIPASE, AMYLASE in the last 168 hours. No results for input(s): AMMONIA in the last 168 hours. Coagulation Profile: Recent Labs  Lab 03/02/21 1444  INR 1.1   Cardiac Enzymes: No results for input(s): CKTOTAL, CKMB, CKMBINDEX, TROPONINI in the last 168 hours. BNP (last 3 results) No results for input(s): PROBNP in the last 8760 hours. HbA1C: Recent Labs    03/02/21 1533   HGBA1C 6.2*   CBG: Recent Labs  Lab 03/03/21 1613 03/03/21 2041 03/04/21 0612 03/04/21 1116 03/04/21 1616  GLUCAP 130* 156* 101* 112* 134*   Lipid Profile: Recent Labs    03/02/21 1556 03/04/21 0126  CHOL 150 129  HDL 64 59  LDLCALC 80 64  TRIG 28 31  CHOLHDL 2.3 2.2   Thyroid Function Tests: No results for input(s): TSH, T4TOTAL, FREET4, T3FREE, THYROIDAB in the last 72 hours. Anemia Panel: Recent Labs    03/04/21 0126  VITAMINB12 372  FOLATE 20.0  FERRITIN 85  TIBC 291  IRON 37*  RETICCTPCT 1.3   Sepsis Labs: Recent Labs  Lab 03/02/21 1537 03/03/21 0904 03/03/21 1125 03/04/21 0126  PROCALCITON  --  0.30  --  0.23  LATICACIDVEN 1.5 1.4 1.4  --     Recent Results (from the past 240 hour(s))  Resp Panel by RT-PCR (Flu A&B, Covid) Urine, Clean Catch     Status: None   Collection Time: 03/02/21  3:38 PM   Specimen: Urine, Clean Catch; Nasopharyngeal(NP) swabs in vial transport medium  Result Value Ref Range Status   SARS Coronavirus 2 by RT PCR NEGATIVE NEGATIVE Final    Comment: (NOTE) SARS-CoV-2 target nucleic acids are NOT DETECTED.  The SARS-CoV-2 RNA is generally detectable in upper respiratory specimens during the acute phase of infection. The lowest concentration of SARS-CoV-2 viral copies this assay can detect is 138 copies/mL. A negative result does not preclude SARS-Cov-2 infection and should not be used as the sole basis for treatment or other patient management decisions. A negative result may occur with  improper specimen collection/handling, submission of specimen other than nasopharyngeal swab, presence of viral mutation(s) within the areas targeted by this assay, and inadequate number of viral copies(<138 copies/mL). A negative result must be combined with clinical observations, patient history, and epidemiological information. The expected result is Negative.  Fact Sheet for Patients:   EntrepreneurPulse.com.au  Fact Sheet for Healthcare Providers:  IncredibleEmployment.be  This test is no t yet approved or cleared by the [place] FDA and  has  been authorized for detection and/or diagnosis of SARS-CoV-2 by FDA under an Emergency Use Authorization (EUA). This EUA will remain  in effect (meaning this test can be used) for the duration of the COVID-19 declaration under Section 564(b)(1) of the Act, 21 U.S.C.section 360bbb-3(b)(1), unless the authorization is terminated  or revoked sooner.       Influenza A by PCR NEGATIVE NEGATIVE Final   Influenza B by PCR NEGATIVE NEGATIVE Final    Comment: (NOTE) The Xpert Xpress SARS-CoV-2/FLU/RSV plus assay is intended as an aid in the diagnosis of influenza from Nasopharyngeal swab specimens and should not be used as a sole basis for treatment. Nasal washings and aspirates are unacceptable for Xpert Xpress SARS-CoV-2/FLU/RSV testing.  Fact Sheet for Patients: EntrepreneurPulse.com.au  Fact Sheet for Healthcare Providers: IncredibleEmployment.be  This test is not yet approved or cleared by the [place] FDA and has been authorized for detection and/or diagnosis of SARS-CoV-2 by FDA under an Emergency Use Authorization (EUA). This EUA will remain in effect (meaning this test can be used) for the duration of the COVID-19 declaration under Section 564(b)(1) of the Act, 21 U.S.C. section 360bbb-3(b)(1), unless the authorization is terminated or revoked.  Performed at [facility], [place] [address], [place], [place] [address]   Blood culture (routine x 2)     Status: None (Preliminary result)   Collection Time: 03/02/21  3:55 PM   Specimen: BLOOD  Result Value Ref Range Status   Specimen Description BLOOD SITE NOT SPECIFIED  Final   Special Requests   Final    BOTTLES DRAWN AEROBIC AND ANAEROBIC Blood Culture results may not be optimal  due to an inadequate volume of blood received in culture bottles   Culture   Final    NO GROWTH 2 DAYS Performed at [facility], [place] [address], [place], [place] [address]    Report Status PENDING  Incomplete  Blood culture (routine x 2)     Status: None (Preliminary result)   Collection Time: 03/02/21  3:56 PM   Specimen: BLOOD  Result Value Ref Range Status   Specimen Description BLOOD SITE NOT SPECIFIED  Final   Special Requests   Final    BOTTLES DRAWN AEROBIC AND ANAEROBIC Blood Culture adequate volume   Culture   Final    NO GROWTH 2 DAYS Performed at [facility], [address], [place], [place] [address]    Report Status PENDING  Incomplete         Radiology Studies: DG Abd 1 View  Result Date: 03/02/2021 CLINICAL DATA:  KUB to rule out foreign body, code stroke EXAM: ABDOMEN - 1 VIEW COMPARISON:  CT renal colic 15/40/0867 FINDINGS: No radiodense metallic foreign body seen within the imaged field of the abdomen. Diffusely air-filled though nonobstructive appearing loops of bowel throughout the abdomen are nonspecific. No suspicious abdominal calcifications. No other acute abdominal process. Degenerative changes in the spine, hips and pelvis is imaged. IMPRESSION: No radiodense metallic foreign body seen. Electronically Signed   By: Lovena Le M.D.   On: 03/02/2021 19:42   MR BRAIN WO CONTRAST  Result Date: 03/03/2021 CLINICAL DATA:  Generalized weakness with altered speech EXAM: MRI HEAD WITHOUT CONTRAST TECHNIQUE: Multiplanar, multiecho pulse sequences of the brain and surrounding structures were obtained without intravenous contrast. COMPARISON:  None. FINDINGS: Brain: No acute infarct, mass effect or extra-axial collection. Remote hemorrhage in the right cerebellum. There is multifocal hyperintense T2-weighted signal within the white matter. Parenchymal  volume and CSF spaces are normal. Old left basal ganglia small vessel infarct. The midline structures are  normal. Vascular: Major flow voids are preserved. Skull and upper cervical spine: Normal calvarium and skull base. Visualized upper cervical spine and soft tissues are normal. Sinuses/Orbits:No paranasal sinus fluid levels or advanced mucosal thickening. No mastoid or middle ear effusion. Normal orbits. IMPRESSION: 1. No acute intracranial abnormality. 2. Old left basal ganglia small vessel infarct and findings of chronic ischemic microangiopathy. Electronically Signed   By: Ulyses Jarred M.D.   On: 03/03/2021 03:27   DG Pelvis Portable  Result Date: 03/03/2021 CLINICAL DATA:  Pelvic pain EXAM: PORTABLE PELVIS 1-2 VIEWS COMPARISON:  None. FINDINGS: Normal alignment. Transitional lumbar anatomy with partial sacralization of L5. No acute fracture or dislocation. Sacroiliac and hip joint spaces are preserved. Vascular calcifications are seen within the pelvis and medial right thigh. IMPRESSION: No acute fracture or dislocation. Electronically Signed   By: Fidela Salisbury MD   On: 03/03/2021 01:34   ECHOCARDIOGRAM COMPLETE  Result Date: 03/03/2021    ECHOCARDIOGRAM REPORT   Patient Name:   CANDY ZIEGLER Date of Exam: 03/03/2021 Medical Rec #:  [id number]     Height:       72.0 in Accession #:    [id number]    Weight:       217.4 lb Date of Birth:  05/29/1956    BSA:          2.207 m Patient Age:    24 years      BP:           102/90 mmHg Patient Gender: M             HR:           98 bpm. Exam Location:  Inpatient Procedure: 2D Echo, Cardiac Doppler and Color Doppler Indications:    Stroke  History:        Patient has no prior history of Echocardiogram examinations.                 CAD, Arrythmias:Atrial Fibrillation; Risk Factors:Diabetes and                 Hypertension. S/P PCI.  Sonographer:    Clayton Lefort RDCS (AE) Referring Phys: [place]  1. Left ventricular ejection fraction, by estimation, is 50 to 55%. The left ventricle has low normal function. The left ventricle has no  regional wall motion abnormalities. There is mild concentric left ventricular hypertrophy. Left ventricular diastolic function could not be evaluated.  2. Right ventricular systolic function is normal. The right ventricular size is normal. There is normal pulmonary artery systolic pressure.  3. Left atrial size was moderately dilated.  4. Right atrial size was mildly dilated.  5. The mitral valve is grossly normal. Trivial mitral valve regurgitation. No evidence of mitral stenosis.  6. The aortic valve is grossly normal. Aortic valve regurgitation is not visualized. No aortic stenosis is present.  7. The inferior vena cava is normal in size with greater than 50% respiratory variability, suggesting right atrial pressure of 3 mmHg. Comparison(s): No prior Echocardiogram. Conclusion(s)/Recommendation(s): Otherwise normal echocardiogram, with minor abnormalities described in the report. No intracardiac source of embolism detected on this transthoracic study. A transesophageal echocardiogram is recommended to exclude cardiac source of embolism if clinically indicated. FINDINGS  Left Ventricle: Left ventricular ejection fraction, by estimation, is 50 to 55%. The left ventricle has low normal function. The left ventricle  has no regional wall motion abnormalities. The left ventricular internal cavity size was normal in size. There is mild concentric left ventricular hypertrophy. Left ventricular diastolic function could not be evaluated due to atrial fibrillation. Left ventricular diastolic function could not be evaluated. Right Ventricle: The right ventricular size is normal. No increase in right ventricular wall thickness. Right ventricular systolic function is normal. There is normal pulmonary artery systolic pressure. The tricuspid regurgitant velocity is 1.70 m/s, and  with an assumed right atrial pressure of 3 mmHg, the estimated right ventricular systolic pressure is 95.1 mmHg. Left Atrium: Left atrial size was  moderately dilated. Right Atrium: Right atrial size was mildly dilated. Pericardium: There is no evidence of pericardial effusion. Mitral Valve: The mitral valve is grossly normal. There is mild thickening of the mitral valve leaflet(s). Trivial mitral valve regurgitation. No evidence of mitral valve stenosis. Tricuspid Valve: The tricuspid valve is normal in structure. Tricuspid valve regurgitation is trivial. Aortic Valve: The aortic valve is grossly normal. Aortic valve regurgitation is not visualized. No aortic stenosis is present. Aortic valve mean gradient measures 2.7 mmHg. Aortic valve peak gradient measures 4.8 mmHg. Aortic valve area, by VTI measures 3.70 cm. Pulmonic Valve: The pulmonic valve was not well visualized. Pulmonic valve regurgitation is not visualized. Aorta: The aortic root and ascending aorta are structurally normal, with no evidence of dilitation. Venous: The inferior vena cava is normal in size with greater than 50% respiratory variability, suggesting right atrial pressure of 3 mmHg. IAS/Shunts: The atrial septum is grossly normal.  LEFT VENTRICLE PLAX 2D LVIDd:         4.90 cm LVIDs:         3.60 cm LV PW:         1.30 cm LV IVS:        1.20 cm LVOT diam:     2.70 cm LV SV:         76 LV SV Index:   34 LVOT Area:     5.73 cm  RIGHT VENTRICLE             IVC RV Basal diam:  3.30 cm     IVC diam: 1.80 cm RV S prime:     14.20 cm/s TAPSE (M-mode): 2.5 cm LEFT ATRIUM             Index       RIGHT ATRIUM           Index LA diam:        3.50 cm 1.59 cm/m  RA Area:     16.30 cm LA Vol (A2C):   73.1 ml 33.12 ml/m RA Volume:   37.10 ml  16.81 ml/m LA Vol (A4C):   62.1 ml 28.13 ml/m LA Biplane Vol: 68.0 ml 30.81 ml/m  AORTIC VALVE AV Area (Vmax):    3.82 cm AV Area (Vmean):   3.66 cm AV Area (VTI):     3.70 cm AV Vmax:           109.33 cm/s AV Vmean:          75.233 cm/s AV VTI:            0.205 m AV Peak Grad:      4.8 mmHg AV Mean Grad:      2.7 mmHg LVOT Vmax:         72.87 cm/s LVOT  Vmean:        48.033 cm/s LVOT VTI:  0.133 m LVOT/AV VTI ratio: 0.65  AORTA Ao Root diam: 3.60 cm Ao Asc diam:  3.50 cm TRICUSPID VALVE TR Peak grad:   11.6 mmHg TR Vmax:        170.00 cm/s  SHUNTS Systemic VTI:  0.13 m Systemic Diam: 2.70 cm Buford Dresser MD Electronically signed by Buford Dresser MD Signature Date/Time: 03/03/2021/7:09:56 PM    Final         Scheduled Meds: . amiodarone  100 mg Oral Daily  . amLODipine  5 mg Oral Daily  . apixaban  5 mg Oral BID  . atorvastatin  10 mg Oral Daily  . benztropine  1 mg Oral QHS  . brimonidine  1 drop Both Eyes BID   And  . timolol  1 drop Both Eyes BID  . calcium carbonate  500 mg of elemental calcium Oral BID WC  . cholecalciferol  50 mcg Oral Daily  . citalopram  20 mg Oral QHS  . divalproex  2,000 mg Oral QHS  . donepezil  10 mg Oral QHS  . ferrous sulfate  325 mg Oral Q breakfast  . insulin aspart  0-9 Units Subcutaneous TID WC  . latanoprost  1 drop Both Eyes QHS  . losartan  25 mg Oral Daily  . melatonin  5 mg Oral QHS  . pantoprazole  40 mg Oral Daily  . risperidone  4 mg Oral QHS   Continuous Infusions: . sodium chloride    . cefTRIAXone (ROCEPHIN)  IV 1 g (03/03/21 2030)     LOS: 1 day    Time spent:40 min    Nimah Uphoff, Geraldo Docker, MD [facility]   If 7PM-7AM, please contact night-coverage 03/04/2021, 7:30 PM

## 2021-03-05 DIAGNOSIS — G9341 Metabolic encephalopathy: Secondary | ICD-10-CM | POA: Diagnosis present

## 2021-03-05 DIAGNOSIS — N390 Urinary tract infection, site not specified: Secondary | ICD-10-CM | POA: Diagnosis present

## 2021-03-05 DIAGNOSIS — A419 Sepsis, unspecified organism: Secondary | ICD-10-CM | POA: Diagnosis present

## 2021-03-05 DIAGNOSIS — I483 Typical atrial flutter: Secondary | ICD-10-CM

## 2021-03-05 DIAGNOSIS — I4892 Unspecified atrial flutter: Secondary | ICD-10-CM | POA: Diagnosis present

## 2021-03-05 DIAGNOSIS — E119 Type 2 diabetes mellitus without complications: Secondary | ICD-10-CM

## 2021-03-05 DIAGNOSIS — I480 Paroxysmal atrial fibrillation: Secondary | ICD-10-CM | POA: Diagnosis present

## 2021-03-05 DIAGNOSIS — F0390 Unspecified dementia without behavioral disturbance: Secondary | ICD-10-CM | POA: Diagnosis present

## 2021-03-05 DIAGNOSIS — E669 Obesity, unspecified: Secondary | ICD-10-CM | POA: Diagnosis present

## 2021-03-05 DIAGNOSIS — R413 Other amnesia: Secondary | ICD-10-CM | POA: Diagnosis present

## 2021-03-05 DIAGNOSIS — F0150 Vascular dementia without behavioral disturbance: Secondary | ICD-10-CM

## 2021-03-05 LAB — GLUCOSE, CAPILLARY
Glucose-Capillary: 82 mg/dL (ref 70–99)
Glucose-Capillary: 120 mg/dL — ABNORMAL HIGH (ref 70–99)

## 2021-03-05 LAB — COMPREHENSIVE METABOLIC PANEL
ALT: 13 U/L (ref 0–44)
AST: 17 U/L (ref 15–41)
Albumin: 2.6 g/dL — ABNORMAL LOW (ref 3.5–5.0)
Alkaline Phosphatase: 42 U/L (ref 38–126)
Anion gap: 5 (ref 5–15)
BUN: 15 mg/dL (ref 8–23)
CO2: 27 mmol/L (ref 22–32)
Calcium: 8.7 mg/dL — ABNORMAL LOW (ref 8.9–10.3)
Chloride: 108 mmol/L (ref 98–111)
Creatinine, Ser: 1 mg/dL (ref 0.61–1.24)
GFR, Estimated: >60 mL/min (ref >60)
Glucose, Bld: 83 mg/dL (ref 70–99)
Potassium: 4.8 mmol/L (ref 3.5–5.1)
Sodium: 140 mmol/L (ref 135–145)
Total Bilirubin: 0.4 mg/dL (ref 0.3–1.2)
Total Protein: 5.5 g/dL — ABNORMAL LOW (ref 6.5–8.1)

## 2021-03-05 LAB — CBC WITH DIFFERENTIAL/PLATELET
Abs Immature Granulocytes: 0.08 10*3/uL — ABNORMAL HIGH (ref 0.00–0.07)
Basophils Absolute: 0 10*3/uL (ref 0.0–0.1)
Basophils Relative: 0 %
Eosinophils Absolute: 0.2 10*3/uL (ref 0.0–0.5)
Eosinophils Relative: 3 %
HCT: 32.2 % — ABNORMAL LOW (ref 39.0–52.0)
Hemoglobin: 10.8 g/dL — ABNORMAL LOW (ref 13.0–17.0)
Immature Granulocytes: 1 %
Lymphocytes Relative: 45 %
Lymphs Abs: 3.9 10*3/uL (ref 0.7–4.0)
MCH: 33.2 pg (ref 26.0–34.0)
MCHC: 33.5 g/dL (ref 30.0–36.0)
MCV: 99.1 fL (ref 80.0–100.0)
Monocytes Absolute: 0.8 10*3/uL (ref 0.1–1.0)
Monocytes Relative: 9 %
Neutro Abs: 3.7 10*3/uL (ref 1.7–7.7)
Neutrophils Relative %: 42 %
Platelets: 142 10*3/uL — ABNORMAL LOW (ref 150–400)
RBC: 3.25 MIL/uL — ABNORMAL LOW (ref 4.22–5.81)
RDW: 13.4 % (ref 11.5–15.5)
WBC: 8.7 10*3/uL (ref 4.0–10.5)
nRBC: 0 % (ref 0.0–0.2)

## 2021-03-05 LAB — URINE CULTURE: Culture: >=100000 — AB

## 2021-03-05 LAB — PHOSPHORUS: Phosphorus: 3.2 mg/dL (ref 2.5–4.6)

## 2021-03-05 LAB — MAGNESIUM: Magnesium: 1.9 mg/dL (ref 1.7–2.4)

## 2021-03-05 LAB — PROCALCITONIN: Procalcitonin: 0.14 ng/mL

## 2021-03-05 MED ORDER — POLYETHYLENE GLYCOL 3350 17 G PO PACK: 17.0000 g | PACK | Freq: Every day | ORAL | Status: DC | PRN

## 2021-03-05 MED ORDER — SULFAMETHOXAZOLE-TRIMETHOPRIM 800-160 MG PO TABS: 1.0000 | ORAL_TABLET | Freq: Two times a day (BID) | ORAL | 0 refills | Status: DC

## 2021-03-05 MED ORDER — METFORMIN HCL 500 MG PO TABS: 500.0000 mg | ORAL_TABLET | Freq: Two times a day (BID) | ORAL | Status: DC

## 2021-03-05 MED ORDER — VITAMIN C 250 MG PO TABS: 250.0000 mg | ORAL_TABLET | Freq: Every day | ORAL | 0 refills | Status: DC

## 2021-03-05 MED ORDER — SULFAMETHOXAZOLE-TRIMETHOPRIM 800-160 MG PO TABS
1.0000 | ORAL_TABLET | Freq: Two times a day (BID) | ORAL | Status: DC
  Administered 2021-03-05: 1 via ORAL
  Filled 2021-03-05: qty 1

## 2021-03-05 NOTE — Discharge Summary (Signed)
Physician Discharge Summary  Brian Dodson RFF:[id number] DOB: Apr 13, 1956 DOA: 03/02/2021  PCP: Brian Nasuti, MD  Admit date: 03/02/2021 Discharge date: 03/05/2021  Time spent: 35 minutes  Recommendations for Outpatient Follow-up:   Sepsis - On admission patient met criteria for sepsis WBC> 12, temp> 38 C, HR> 90, RR> 20.  Source.  UTI? -Will trend lactic acid and procalcitonin Results for Brian Dodson (MRN [id number]) as of 03/05/2021 10:20  Ref. Range 03/03/2021 09:04 03/04/2021 01:26 03/05/2021 04:10  Procalcitonin Latest Units: ng/mL 0.30 0.23 0.14  -Sepsis physiology has resolved  UTI positive GNR - Empirically treating for UTI - WBC WNL, negative left shift, negative bands negative fever.  - Patient will complete 5-day course antibiotics p.o. at home  Acute encephalopathy vs baseline dementia/schizophrenia/memory loss. - Patient currently follows all commands, pleasant but has an underlying cognitive deficit. -Per [facility] at baseline -Brain MRI negative for acute infarct see results below - Benztropine 1 mg daily - Celexa 20 mg daily - Depakote 2000 mg daily - Donepezil 10 mg daily - Risperidone 4 mg daily   Hx CVA - See MRI results below  Atrial fibrillation/flutter - MRI negative for acute infarct restart Eliquis 5 mg BID - Currently NSR - Echocardiogram; EF mildly decreased see results below - Amiodarone 100 mg daily - Amlodipine 5 mg daily - Losartan 25 mg daily  Essential HTN - See A. Fib  CAD - See A. fib -  4/16 LDL = 64: At goal  DM type II controlled without complication - 9/03 hemoglobin A1c= 6.2 - Metformin 500 mg BID  Anemia of chronic disease -Anemia panel most consistent with anemia of chronic disease - Currently no obvious sites of blood loss Lab Results  Component Value Date   HGB 10.8 (L) 03/05/2021   HGB 10.7 (L) 03/04/2021   HGB 11.5 (L) 03/03/2021   HGB 12.6 (L) 03/02/2021   HGB 12.3 (L) 03/02/2021  -Stable  Obese  (BMI 29.48 kg/m)    Discharge Diagnoses:  Principal Problem:   Acute encephalopathy Active Problems:   Schizophrenia ([place])   Diabetes mellitus, type II (HCC)   HTN (hypertension)   CAD (coronary artery disease)   Anemia   Loss of memory   Acute lower UTI   Unspecified atrial fibrillation (HCC)   Anemia of chronic disease   Sepsis secondary to UTI ([place])   Acute metabolic encephalopathy   Dementia ([place])   Memory loss due to medical condition   AF (paroxysmal atrial fibrillation) ([place])   Atrial flutter ([place])   Diabetes mellitus type 2, controlled, without complications ([place])   Obese   Discharge Condition: Stable for discharge  Diet recommendation: Heart healthy/carb modified  Filed Weights   03/02/21 1400 03/02/21 2208  Weight: 102.8 kg 98.6 kg    History of present illness:  Brian Mckinnon Jonesis a 65 y.o.BM PMHx Dementia, schizophrenia, loss of memory, paroxysmal atrial fibrillation, CAD s/p stent, essential HTN DM type II  Found to be increasingly confused with garbled speech and difficulty ambulating and was brought to the ER. Most of the history was obtained from ER physician as patient appears confused. Family not at bedside.  ED Course:In the ER patient had a fever 103 F. UA is concerning for UTI. Covid test was negative. CT of the head was unremarkable. On-call neurology was consulted since there was some concern for possible stroke given the garbled speech. Neurology recommended MRI brain and if positive for stroke then further stroke work-up. Patient had blood  cultures drawn and started on empiric antibiotics. Chest x-ray was unremarkable Covid test was negative. EKG shows atrial flutter rate controlled.   Hospital Course:  See above  Procedures: 4/15 MRI brain W0 contrast:No acute intracranial abnormality. -Old left basal ganglia small vessel infarct and findings of chronic ischemic microangiopathy. 4/15 echocardiogram:Left Ventricle: LVEF=50 to  55%.  -Left ventricular diastolic function could not be evaluated due to atrial fibrillation.  Left Atrium:  moderately dilated.   Consultations: Neurology   Cultures   4/14 blood x 2 NGTD   4/14 urine positive GNR  Antibiotics Anti-infectives (From admission, onward)   Start     Ordered Stop   03/05/21 1115  sulfamethoxazole-trimethoprim (BACTRIM DS) 800-160 MG per tablet 1 tablet        03/05/21 1024 03/08/21 0959   03/02/21 2000  cefTRIAXone (ROCEPHIN) 1 g in sodium chloride 0.9 % 100 mL IVPB  Status:  Discontinued        03/02/21 1932 03/05/21 1024   03/02/21 1800  cefTRIAXone (ROCEPHIN) 1 g in sodium chloride 0.9 % 100 mL IVPB  Status:  Discontinued        03/02/21 1745 03/02/21 1941       Discharge Exam: Vitals:   03/04/21 1825 03/04/21 2007 03/04/21 2345 03/05/21 0419  BP: 109/65 121/81 119/77 124/76  Pulse: 65 66 64 65  Resp: _0 Temp: 97.6 F (36.4 C) 98.8 F (37.1 C) 98 F (36.7 C) 97.9 F (36.6 C)  TempSrc: Oral Oral Oral Oral  SpO2: 99% 95% 98% 99%  Weight:      Height:       General: A/O x2 (does not know where, why) does know that he is in [place].  Follows all commands., No acute respiratory distress Eyes: negative scleral hemorrhage, negative anisocoria, negative icterus ENT: Negative Runny nose, negative gingival bleeding, Neck:  Negative scars, masses, torticollis, lymphadenopathy, JVD Lungs: Clear to auscultation bilaterally without wheezes or crackles Cardiovascular: Regular rate and rhythm without murmur gallop or rub normal S1 and S2   Discharge Instructions   Allergies as of 03/05/2021   No Known Allergies     Medication List    TAKE these medications   acetaminophen 500 MG tablet Commonly known as: TYLENOL Take 1,000 mg by mouth every 6 (six) hours as needed for mild pain.   amiodarone 200 MG tablet Commonly known as: PACERONE Take 0.5 tablets (100 mg total) by mouth daily.   amLODipine 5 MG tablet Commonly known  as: NORVASC Take 5 mg by mouth daily.   aspirin 81 MG tablet Take 81 mg by mouth daily.   atorvastatin 10 MG tablet Commonly known as: LIPITOR Take 10 mg by mouth daily.   benztropine 1 MG tablet Commonly known as: COGENTIN Take 1 mg by mouth at bedtime.   citalopram 20 MG tablet Commonly known as: CELEXA Take 20 mg by mouth at bedtime.   Combigan 0.2-0.5 % ophthalmic solution Generic drug: brimonidine-timolol Place 1 drop into both eyes every 12 (twelve) hours.   diclofenac Sodium 1 % Gel Commonly known as: VOLTAREN Apply 2 g topically 4 (four) times daily. Apply to neck   divalproex 500 MG 24 hr tablet Commonly known as: DEPAKOTE ER Take 2,000 mg by mouth at bedtime.   donepezil 10 MG tablet Commonly known as: ARICEPT Take 10 mg by mouth at bedtime.   Eliquis 5 MG Tabs tablet Generic drug: apixaban Take 5 mg by mouth 2 (two) times daily.  EQL Vitamin D3 50 MCG (2000 UT) Caps Generic drug: Cholecalciferol Take 1 capsule by mouth daily.   ferrous sulfate 325 (65 FE) MG tablet Take 325 mg by mouth daily with breakfast.   latanoprost 0.005 % ophthalmic solution Commonly known as: XALATAN Place 1 drop into both eyes at bedtime.   losartan 25 MG tablet Commonly known as: Cozaar Take 1 tablet (25 mg total) by mouth daily. May increase to 2 tablets by mouth daily if blood pressure goes above 140/90 for more than a day  or if the blood pressure goes above 150/100 at all.   melatonin 5 MG Tabs Take 5 mg by mouth at bedtime.   metFORMIN 500 MG tablet Commonly known as: GLUCOPHAGE Take 500 mg by mouth 2 (two) times daily with a meal.   omeprazole 20 MG capsule Commonly known as: PRILOSEC Take 20 mg by mouth 2 (two) times daily before a meal.   oyster calcium 500 MG Tabs tablet Take 500 mg of elemental calcium by mouth 2 (two) times daily.   risperidone 4 MG tablet Commonly known as: RISPERDAL Take 4 mg by mouth at bedtime.   sulfamethoxazole-trimethoprim  800-160 MG tablet Commonly known as: BACTRIM DS Take 1 tablet by mouth every 12 (twelve) hours.   vitamin C 250 MG tablet Commonly known as: ASCORBIC ACID Take 1 tablet (250 mg total) by mouth daily.      No Known Allergies    The results of significant diagnostics from this hospitalization (including imaging, microbiology, ancillary and laboratory) are listed below for reference.    Significant Diagnostic Studies: DG Abd 1 View  Result Date: 03/02/2021 CLINICAL DATA:  KUB to rule out foreign body, code stroke EXAM: ABDOMEN - 1 VIEW COMPARISON:  CT renal colic [id number] FINDINGS: No radiodense metallic foreign body seen within the imaged field of the abdomen. Diffusely air-filled though nonobstructive appearing loops of bowel throughout the abdomen are nonspecific. No suspicious abdominal calcifications. No other acute abdominal process. Degenerative changes in the spine, hips and pelvis is imaged. IMPRESSION: No radiodense metallic foreign body seen. Electronically Signed   By: Lovena Le M.D.   On: 03/02/2021 19:42   MR BRAIN WO CONTRAST  Result Date: 03/03/2021 CLINICAL DATA:  Generalized weakness with altered speech EXAM: MRI HEAD WITHOUT CONTRAST TECHNIQUE: Multiplanar, multiecho pulse sequences of the brain and surrounding structures were obtained without intravenous contrast. COMPARISON:  None. FINDINGS: Brain: No acute infarct, mass effect or extra-axial collection. Remote hemorrhage in the right cerebellum. There is multifocal hyperintense T2-weighted signal within the white matter. Parenchymal volume and CSF spaces are normal. Old left basal ganglia small vessel infarct. The midline structures are normal. Vascular: Major flow voids are preserved. Skull and upper cervical spine: Normal calvarium and skull base. Visualized upper cervical spine and soft tissues are normal. Sinuses/Orbits:No paranasal sinus fluid levels or advanced mucosal thickening. No mastoid or middle ear  effusion. Normal orbits. IMPRESSION: 1. No acute intracranial abnormality. 2. Old left basal ganglia small vessel infarct and findings of chronic ischemic microangiopathy. Electronically Signed   By: Ulyses Jarred M.D.   On: 03/03/2021 03:27   DG Pelvis Portable  Result Date: 03/03/2021 CLINICAL DATA:  Pelvic pain EXAM: PORTABLE PELVIS 1-2 VIEWS COMPARISON:  None. FINDINGS: Normal alignment. Transitional lumbar anatomy with partial sacralization of L5. No acute fracture or dislocation. Sacroiliac and hip joint spaces are preserved. Vascular calcifications are seen within the pelvis and medial right thigh. IMPRESSION: No acute fracture or dislocation. Electronically Signed  By: Fidela Salisbury MD   On: 03/03/2021 01:34   DG Chest Port 1 View  Result Date: 03/02/2021 CLINICAL DATA:  Fever. Code stroke. EXAM: PORTABLE CHEST 1 VIEW COMPARISON:  November 18, 2018 FINDINGS: Tortuosity of the thoracic aorta. Prominence of the aortic knob, which may be exaggerated by AP technique. Cardiomediastinal silhouette is normal. Mediastinal contours appear intact. There is no evidence of focal airspace consolidation, pleural effusion or pneumothorax. Osseous structures are without acute abnormality. Soft tissues are grossly normal. IMPRESSION: 1. Tortuosity of the thoracic aorta. 2. Prominence of the aortic knob, which may be exaggerated by AP technique. If any clinical concern for acute aortic syndrome, evaluation with CT of the chest should be considered. Electronically Signed   By: Fidela Salisbury M.D.   On: 03/02/2021 15:58   ECHOCARDIOGRAM COMPLETE  Result Date: 03/03/2021    ECHOCARDIOGRAM REPORT   Patient Name:   Brian Dodson Date of Exam: 03/03/2021 Medical Rec #:  [id number]     Height:       72.0 in Accession #:    [id number]    Weight:       217.4 lb Date of Birth:  29-Nov-1955    BSA:          2.207 m Patient Age:    30 years      BP:           102/90 mmHg Patient Gender: M             HR:           98  bpm. Exam Location:  Inpatient Procedure: 2D Echo, Cardiac Doppler and Color Doppler Indications:    Stroke  History:        Patient has no prior history of Echocardiogram examinations.                 CAD, Arrythmias:Atrial Fibrillation; Risk Factors:Diabetes and                 Hypertension. S/P PCI.  Sonographer:    Clayton Lefort RDCS (AE) Referring Phys: [place]  1. Left ventricular ejection fraction, by estimation, is 50 to 55%. The left ventricle has low normal function. The left ventricle has no regional wall motion abnormalities. There is mild concentric left ventricular hypertrophy. Left ventricular diastolic function could not be evaluated.  2. Right ventricular systolic function is normal. The right ventricular size is normal. There is normal pulmonary artery systolic pressure.  3. Left atrial size was moderately dilated.  4. Right atrial size was mildly dilated.  5. The mitral valve is grossly normal. Trivial mitral valve regurgitation. No evidence of mitral stenosis.  6. The aortic valve is grossly normal. Aortic valve regurgitation is not visualized. No aortic stenosis is present.  7. The inferior vena cava is normal in size with greater than 50% respiratory variability, suggesting right atrial pressure of 3 mmHg. Comparison(s): No prior Echocardiogram. Conclusion(s)/Recommendation(s): Otherwise normal echocardiogram, with minor abnormalities described in the report. No intracardiac source of embolism detected on this transthoracic study. A transesophageal echocardiogram is recommended to exclude cardiac source of embolism if clinically indicated. FINDINGS  Left Ventricle: Left ventricular ejection fraction, by estimation, is 50 to 55%. The left ventricle has low normal function. The left ventricle has no regional wall motion abnormalities. The left ventricular internal cavity size was normal in size. There is mild concentric left ventricular hypertrophy. Left ventricular  diastolic function could not be evaluated due to  atrial fibrillation. Left ventricular diastolic function could not be evaluated. Right Ventricle: The right ventricular size is normal. No increase in right ventricular wall thickness. Right ventricular systolic function is normal. There is normal pulmonary artery systolic pressure. The tricuspid regurgitant velocity is 1.70 m/s, and  with an assumed right atrial pressure of 3 mmHg, the estimated right ventricular systolic pressure is 10.9 mmHg. Left Atrium: Left atrial size was moderately dilated. Right Atrium: Right atrial size was mildly dilated. Pericardium: There is no evidence of pericardial effusion. Mitral Valve: The mitral valve is grossly normal. There is mild thickening of the mitral valve leaflet(s). Trivial mitral valve regurgitation. No evidence of mitral valve stenosis. Tricuspid Valve: The tricuspid valve is normal in structure. Tricuspid valve regurgitation is trivial. Aortic Valve: The aortic valve is grossly normal. Aortic valve regurgitation is not visualized. No aortic stenosis is present. Aortic valve mean gradient measures 2.7 mmHg. Aortic valve peak gradient measures 4.8 mmHg. Aortic valve area, by VTI measures 3.70 cm. Pulmonic Valve: The pulmonic valve was not well visualized. Pulmonic valve regurgitation is not visualized. Aorta: The aortic root and ascending aorta are structurally normal, with no evidence of dilitation. Venous: The inferior vena cava is normal in size with greater than 50% respiratory variability, suggesting right atrial pressure of 3 mmHg. IAS/Shunts: The atrial septum is grossly normal.  LEFT VENTRICLE PLAX 2D LVIDd:         4.90 cm LVIDs:         3.60 cm LV PW:         1.30 cm LV IVS:        1.20 cm LVOT diam:     2.70 cm LV SV:         76 LV SV Index:   34 LVOT Area:     5.73 cm  RIGHT VENTRICLE             IVC RV Basal diam:  3.30 cm     IVC diam: 1.80 cm RV S prime:     14.20 cm/s TAPSE (M-mode): 2.5 cm LEFT ATRIUM              Index       RIGHT ATRIUM           Index LA diam:        3.50 cm 1.59 cm/m  RA Area:     16.30 cm LA Vol (A2C):   73.1 ml 33.12 ml/m RA Volume:   37.10 ml  16.81 ml/m LA Vol (A4C):   62.1 ml 28.13 ml/m LA Biplane Vol: 68.0 ml 30.81 ml/m  AORTIC VALVE AV Area (Vmax):    3.82 cm AV Area (Vmean):   3.66 cm AV Area (VTI):     3.70 cm AV Vmax:           109.33 cm/s AV Vmean:          75.233 cm/s AV VTI:            0.205 m AV Peak Grad:      4.8 mmHg AV Mean Grad:      2.7 mmHg LVOT Vmax:         72.87 cm/s LVOT Vmean:        48.033 cm/s LVOT VTI:          0.133 m LVOT/AV VTI ratio: 0.65  AORTA Ao Root diam: 3.60 cm Ao Asc diam:  3.50 cm TRICUSPID VALVE TR Peak grad:   11.6 mmHg TR Vmax:  170.00 cm/s  SHUNTS Systemic VTI:  0.13 m Systemic Diam: 2.70 cm Buford Dresser MD Electronically signed by Buford Dresser MD Signature Date/Time: 03/03/2021/7:09:56 PM    Final    CT HEAD CODE STROKE WO CONTRAST  Result Date: 03/02/2021 CLINICAL DATA:  Code stroke.  Acute neuro deficit.  Abnormal speech EXAM: CT HEAD WITHOUT CONTRAST TECHNIQUE: Contiguous axial images were obtained from the base of the skull through the vertex without intravenous contrast. COMPARISON:  CT head 01/29/2014 FINDINGS: Brain: Mild atrophy. Mild to moderate white matter changes with patchy white matter hypodensity bilaterally. Progression since 2015 Negative for acute infarct, hemorrhage, mass. Vascular: Negative for hyperdense vessel Skull: Negative Sinuses/Orbits: Minimal mucosal edema paranasal sinuses. Negative orbit Other: None ASPECTS ([place] Stroke Program Early CT Score) - Ganglionic level infarction (caudate, lentiform nuclei, internal capsule, insula, M1-M3 cortex): 7 - Supraganglionic infarction (M4-M6 cortex): 3 Total score (0-10 with 10 being normal): 10 IMPRESSION: 1. No acute abnormality 2. Mild atrophy and mild to moderate white matter changes most consistent with chronic microvascular ischemia with  progression since 2015. 3. ASPECTS is 10 4. Code stroke imaging results were communicated on 03/02/2021 at 2:57 pm to provider Leonel Ramsay via text page Electronically Signed   By: Franchot Gallo M.D.   On: 03/02/2021 14:58    Microbiology: Recent Results (from the past 240 hour(s))  Resp Panel by RT-PCR (Flu A&B, Covid) Urine, Clean Catch     Status: None   Collection Time: 03/02/21  3:38 PM   Specimen: Urine, Clean Catch; Nasopharyngeal(NP) swabs in vial transport medium  Result Value Ref Range Status   SARS Coronavirus 2 by RT PCR NEGATIVE NEGATIVE Final    Comment: (NOTE) SARS-CoV-2 target nucleic acids are NOT DETECTED.  The SARS-CoV-2 RNA is generally detectable in upper respiratory specimens during the acute phase of infection. The lowest concentration of SARS-CoV-2 viral copies this assay can detect is 138 copies/mL. A negative result does not preclude SARS-Cov-2 infection and should not be used as the sole basis for treatment or other patient management decisions. A negative result may occur with  improper specimen collection/handling, submission of specimen other than nasopharyngeal swab, presence of viral mutation(s) within the areas targeted by this assay, and inadequate number of viral copies(<138 copies/mL). A negative result must be combined with clinical observations, patient history, and epidemiological information. The expected result is Negative.  Fact Sheet for Patients:  EntrepreneurPulse.com.au  Fact Sheet for Healthcare Providers:  IncredibleEmployment.be  This test is no t yet approved or cleared by the [place] FDA and  has been authorized for detection and/or diagnosis of SARS-CoV-2 by FDA under an Emergency Use Authorization (EUA). This EUA will remain  in effect (meaning this test can be used) for the duration of the COVID-19 declaration under Section 564(b)(1) of the Act, 21 U.S.C.section 360bbb-3(b)(1), unless  the authorization is terminated  or revoked sooner.       Influenza A by PCR NEGATIVE NEGATIVE Final   Influenza B by PCR NEGATIVE NEGATIVE Final    Comment: (NOTE) The Xpert Xpress SARS-CoV-2/FLU/RSV plus assay is intended as an aid in the diagnosis of influenza from Nasopharyngeal swab specimens and should not be used as a sole basis for treatment. Nasal washings and aspirates are unacceptable for Xpert Xpress SARS-CoV-2/FLU/RSV testing.  Fact Sheet for Patients: EntrepreneurPulse.com.au  Fact Sheet for Healthcare Providers: IncredibleEmployment.be  This test is not yet approved or cleared by the [place] FDA and has been authorized for detection and/or diagnosis of  SARS-CoV-2 by FDA under an Emergency Use Authorization (EUA). This EUA will remain in effect (meaning this test can be used) for the duration of the COVID-19 declaration under Section 564(b)(1) of the Act, 21 U.S.C. section 360bbb-3(b)(1), unless the authorization is terminated or revoked.  Performed at [facility], [place] [address], [place], [place] [address]   Blood culture (routine x 2)     Status: None (Preliminary result)   Collection Time: 03/02/21  3:55 PM   Specimen: BLOOD  Result Value Ref Range Status   Specimen Description BLOOD SITE NOT SPECIFIED  Final   Special Requests   Final    BOTTLES DRAWN AEROBIC AND ANAEROBIC Blood Culture results may not be optimal due to an inadequate volume of blood received in culture bottles   Culture   Final    NO GROWTH 2 DAYS Performed at [facility], [place] [address], [place], [place] [address]    Report Status PENDING  Incomplete  Blood culture (routine x 2)     Status: None (Preliminary result)   Collection Time: 03/02/21  3:56 PM   Specimen: BLOOD  Result Value Ref Range Status   Specimen Description BLOOD SITE NOT SPECIFIED  Final   Special Requests   Final    BOTTLES DRAWN AEROBIC AND ANAEROBIC Blood  Culture adequate volume   Culture   Final    NO GROWTH 2 DAYS Performed at [facility], [address], [place], [place] [address]    Report Status PENDING  Incomplete  Urine Culture     Status: Abnormal   Collection Time: 03/02/21  5:46 PM   Specimen: Urine, Random  Result Value Ref Range Status   Specimen Description URINE, RANDOM  Final   Special Requests   Final    NONE Performed at [facility], [place] [address] [place], [place] [address]    Culture (A)  Final    >=100,000 COLONIES/mL MULTIPLE SPECIES PRESENT, SUGGEST RECOLLECTION   Report Status 03/05/2021 FINAL  Final     Labs: Basic Metabolic Panel: Recent Labs  Lab 03/02/21 1444 03/02/21 1450 03/03/21 0904 03/04/21 0126 03/05/21 0410  NA 135 138 135 136 140  K 4.7 4.6 4.0 4.4 4.8  CL 101 102 104 103 108  CO2 25  --  _0 GLUCOSE 105* 103* 124* 167* 83  BUN _1 CREATININE 1.17 1.10 1.04 1.08 1.00  CALCIUM 9.1  --  8.7* 8.8* 8.7*  MG  --   --  1.9 2.0 1.9  PHOS  --   --  2.4* 2.8 3.2   Liver Function Tests: Recent Labs  Lab 03/02/21 1444 03/03/21 0904 03/04/21 0126 03/05/21 0410  AST _2 ALT _3 ALKPHOS 53 48 55 42  BILITOT 0.3 0.5 <0.1* 0.4  PROT 6.7 6.0* 5.5* 5.5*  ALBUMIN 3.6 2.8* 2.7* 2.6*   No results for input(s): LIPASE, AMYLASE in the last 168 hours. No results for input(s): AMMONIA in the last 168 hours. CBC: Recent Labs  Lab 03/02/21 1444 03/02/21 1450 03/03/21 0904 03/04/21 0126 03/05/21 0410  WBC 13.0*  --  15.8* 12.8* 8.7  NEUTROABS 10.6*  --  11.9* 7.2 3.7  HGB 12.3* 12.6* 11.5* 10.7* 10.8*  HCT 37.2* 37.0* 35.0* 32.5* 32.2*  MCV 98.4  --  98.6 98.2 99.1  PLT 163  --  144* 138* 142*   Cardiac Enzymes: No  results for input(s): CKTOTAL, CKMB, CKMBINDEX, TROPONINI in the last 168 hours. BNP: BNP (last 3 results) No results for input(s): BNP in the last 8760 hours.  ProBNP (last 3 results) No results for input(s): PROBNP in  the last 8760 hours.  CBG: Recent Labs  Lab 03/04/21 1116 03/04/21 1616 03/04/21 2101 03/05/21 0615 03/05/21 1112  GLUCAP 112* 134* 148* 82 120*       Signed:  Dia Crawford, MD [facility]

## 2021-03-05 NOTE — Progress Notes (Signed)
RN called [facility] and gave report to RN and let them know that [facility] had arrived to pick up pt. IV has been removed and pt is dressed.

## 2021-03-05 NOTE — TOC Transition Note (Signed)
Transition of Care [facility]) - CM/SW Discharge Note   Patient Details  Name: Brian Dodson MRN: [id number] Date of Birth: December 23, 1955  Transition of Care [facility]) CM/SW Contact:  Annalee Genta, LCSW Phone Number: 03/05/2021, 11:36 AM   Clinical Narrative:    CSW informed by attending patient is medically cleared. CSW confirmed with RN at [facility]. CSW completed PTAR paperwork and left on unit. CSW contacted [facility] and requested transportation. [facility] request staff on unit to call them when PTAR arrives so they will be able to estimate patient's arrival.      Barriers to Discharge: Barriers Resolved   Patient Goals and CMS Choice        Discharge Placement                       Discharge Plan and Services                                     Social Determinants of Health (SDOH) Interventions     Readmission Risk Interventions No flowsheet data found.

## 2021-03-06 NOTE — NC FL2 (Addendum)
[place] MEDICAID FL2 LEVEL OF CARE SCREENING TOOL     IDENTIFICATION  Patient Name: Brian Dodson Birthdate: [birth year] Sex: male Admission Date (Current Location): 03/02/2021  [facility] and [place] Number:  Producer, television/film/video and Address:  [facility] [place], [address] [place], [place] [address]      Provider Number: [id number]  Attending Physician Name and Address:  No att. providers found  Relative Name and Phone Number:       Current Level of Care: Hospital Recommended Level of Care: Assisted Living Facility Prior Approval Number:    Date Approved/Denied:   PASRR Number:    Discharge Plan: Other (Comment) (ALF)    Current Diagnoses: Patient Active Problem List   Diagnosis Date Noted  . Sepsis secondary to UTI (HCC) 03/05/2021  . Acute metabolic encephalopathy 03/05/2021  . Dementia (HCC) 03/05/2021  . Memory loss due to medical condition 03/05/2021  . AF (paroxysmal atrial fibrillation) (HCC) 03/05/2021  . Atrial flutter (HCC) 03/05/2021  . Diabetes mellitus type 2, controlled, without complications (HCC) 03/05/2021  . Obese 03/05/2021  . Anemia of chronic disease 03/04/2021  . Acute encephalopathy 03/02/2021  . Acute lower UTI 03/02/2021  . Unspecified atrial fibrillation (HCC) 03/02/2021  . Acute febrile illness   . Bilateral lower extremity edema 04/01/2018  . Lower extremity pain, bilateral 04/01/2018  . Swelling of limb 10/04/2017  . Sepsis (HCC) 05/20/2017  . Anemia 05/07/2017  . B12 deficiency 05/07/2017  . Loss of memory 05/07/2017  . Atrial fibrillation with RVR (HCC) 05/23/2015  . Syncope 05/23/2015  . Schizophrenia (HCC) 05/23/2015  . Diabetes mellitus, type II (HCC) 05/23/2015  . HTN (hypertension) 05/23/2015  . GERD (gastroesophageal reflux disease) 05/23/2015  . CAD (coronary artery disease) 05/23/2015    Orientation RESPIRATION BLADDER Height & Weight     Self,Time,Place  Normal Continent Weight: 217 lb 6 oz  (98.6 kg) Height:  6' (182.9 cm)  BEHAVIORAL SYMPTOMS/MOOD NEUROLOGICAL BOWEL NUTRITION STATUS      Continent Diet (HEart Healthy)  AMBULATORY STATUS COMMUNICATION OF NEEDS Skin   Supervision Verbally Normal                       Personal Care Assistance Level of Assistance  Bathing,Feeding,Dressing Bathing Assistance: Independent Feeding assistance: Independent Dressing Assistance: Independent     Functional Limitations Info  Sight,Hearing,Speech Sight Info: Adequate Hearing Info: Adequate Speech Info: Adequate    SPECIAL CARE FACTORS FREQUENCY                       Contractures Contractures Info: Not present    Additional Factors Info  Code Status,Allergies Code Status Info: Full Allergies Info: NKA           Current Medications (03/06/2021):  This is the current hospital active medication list No current facility-administered medications for this encounter.   Current Outpatient Medications  Medication Sig Dispense Refill  . acetaminophen (TYLENOL) 500 MG tablet Take 1,000 mg by mouth every 6 (six) hours as needed for mild pain.    Marland Kitchen amiodarone (PACERONE) 200 MG tablet Take 0.5 tablets (100 mg total) by mouth daily. (Patient taking differently: Take 100 mg by mouth daily.)    . amLODipine (NORVASC) 5 MG tablet Take 5 mg by mouth daily.    Marland Kitchen aspirin 81 MG tablet Take 81 mg by mouth daily.     Marland Kitchen atorvastatin (LIPITOR) 10 MG tablet Take 10 mg by mouth  daily.    . benztropine (COGENTIN) 1 MG tablet Take 1 mg by mouth at bedtime.     . brimonidine-timolol (COMBIGAN) 0.2-0.5 % ophthalmic solution Place 1 drop into both eyes every 12 (twelve) hours.    . Cholecalciferol (EQL VITAMIN D3) 50 MCG (2000 UT) CAPS Take 1 capsule by mouth daily.    . citalopram (CELEXA) 20 MG tablet Take 20 mg by mouth at bedtime.     . diclofenac Sodium (VOLTAREN) 1 % GEL Apply 2 g topically 4 (four) times daily. Apply to neck    . divalproex (DEPAKOTE ER) 500 MG 24 hr tablet Take  2,000 mg by mouth at bedtime.     . donepezil (ARICEPT) 10 MG tablet Take 10 mg by mouth at bedtime.     Marland Kitchen ELIQUIS 5 MG TABS tablet Take 5 mg by mouth 2 (two) times daily.    . ferrous sulfate 325 (65 FE) MG tablet Take 325 mg by mouth daily with breakfast.    . latanoprost (XALATAN) 0.005 % ophthalmic solution Place 1 drop into both eyes at bedtime.    Marland Kitchen losartan (COZAAR) 25 MG tablet Take 1 tablet (25 mg total) by mouth daily. May increase to 2 tablets by mouth daily if blood pressure goes above 140/90 for more than a day  or if the blood pressure goes above 150/100 at all. 30 tablet 11  . melatonin 5 MG TABS Take 5 mg by mouth at bedtime.    . metFORMIN (GLUCOPHAGE) 500 MG tablet Take 500 mg by mouth 2 (two) times daily with a meal.    . omeprazole (PRILOSEC) 20 MG capsule Take 20 mg by mouth 2 (two) times daily before a meal.    . Oyster Shell (OYSTER CALCIUM) 500 MG TABS tablet Take 500 mg of elemental calcium by mouth 2 (two) times daily.    . risperidone (RISPERDAL) 4 MG tablet Take 4 mg by mouth at bedtime.    . vitamin C (ASCORBIC ACID) 250 MG tablet Take 1 tablet (250 mg total) by mouth daily. 30 tablet 0  . sulfamethoxazole-trimethoprim (BACTRIM DS) 800-160 MG tablet Take 1 tablet by mouth every 12 (twelve) hours. 6 tablet 0     Discharge Medications: acetaminophen 500 MG tablet Commonly known as: TYLENOL Take 1,000 mg by mouth every 6 (six) hours as needed for mild pain.   amiodarone 200 MG tablet Commonly known as: PACERONE Take 0.5 tablets (100 mg total) by mouth daily.   amLODipine 5 MG tablet Commonly known as: NORVASC Take 5 mg by mouth daily.   aspirin 81 MG tablet Take 81 mg by mouth daily.   atorvastatin 10 MG tablet Commonly known as: LIPITOR Take 10 mg by mouth daily.   benztropine 1 MG tablet Commonly known as: COGENTIN Take 1 mg by mouth at bedtime.   citalopram 20 MG tablet Commonly known as: CELEXA Take 20 mg by mouth at bedtime.   Combigan  0.2-0.5 % ophthalmic solution Generic drug: brimonidine-timolol Place 1 drop into both eyes every 12 (twelve) hours.   diclofenac Sodium 1 % Gel Commonly known as: VOLTAREN Apply 2 g topically 4 (four) times daily. Apply to neck   divalproex 500 MG 24 hr tablet Commonly known as: DEPAKOTE ER Take 2,000 mg by mouth at bedtime.   donepezil 10 MG tablet Commonly known as: ARICEPT Take 10 mg by mouth at bedtime.   Eliquis 5 MG Tabs tablet Generic drug: apixaban Take 5 mg by mouth 2 (two) times  daily.   EQL Vitamin D3 50 MCG (2000 UT) Caps Generic drug: Cholecalciferol Take 1 capsule by mouth daily.   ferrous sulfate 325 (65 FE) MG tablet Take 325 mg by mouth daily with breakfast.   latanoprost 0.005 % ophthalmic solution Commonly known as: XALATAN Place 1 drop into both eyes at bedtime.   losartan 25 MG tablet Commonly known as: Cozaar Take 1 tablet (25 mg total) by mouth daily. May increase to 2 tablets by mouth daily if blood pressure goes above 140/90 for more than a day  or if the blood pressure goes above 150/100 at all.   melatonin 5 MG Tabs Take 5 mg by mouth at bedtime.   metFORMIN 500 MG tablet Commonly known as: GLUCOPHAGE Take 500 mg by mouth 2 (two) times daily with a meal.   omeprazole 20 MG capsule Commonly known as: PRILOSEC Take 20 mg by mouth 2 (two) times daily before a meal.   oyster calcium 500 MG Tabs tablet Take 500 mg of elemental calcium by mouth 2 (two) times daily.   risperidone 4 MG tablet Commonly known as: RISPERDAL Take 4 mg by mouth at bedtime.   sulfamethoxazole-trimethoprim 800-160 MG tablet Commonly known as: BACTRIM DS Take 1 tablet by mouth every 12 (twelve) hours.   vitamin C 250 MG tablet Commonly known as: ASCORBIC ACID Take 1 tablet (250 mg total) by mouth daily.     Relevant Imaging Results:  Relevant Lab Results:   Additional Information SSN [id number]  Jimmy Picket, [place]

## 2021-03-07 LAB — CULTURE, BLOOD (ROUTINE X 2)
Culture: NO GROWTH
Culture: NO GROWTH
Special Requests: ADEQUATE

## 2021-07-22 ENCOUNTER — Emergency Department: Payer: Medicare Other

## 2021-07-22 ENCOUNTER — Other Ambulatory Visit: Payer: Self-pay

## 2021-07-22 ENCOUNTER — Inpatient Hospital Stay: Payer: Medicare Other

## 2021-07-22 ENCOUNTER — Inpatient Hospital Stay
Admission: EM | Admit: 2021-07-22 | Discharge: 2021-07-26 | DRG: 871 | Disposition: A | Discharge status: Nursing Home (Includes ICF) | Payer: Medicare Other | Source: Skilled Nursing Facility | Attending: Internal Medicine | Admitting: Internal Medicine

## 2021-07-22 DIAGNOSIS — R410 Disorientation, unspecified: Secondary | ICD-10-CM | POA: Diagnosis not present

## 2021-07-22 DIAGNOSIS — K219 Gastro-esophageal reflux disease without esophagitis: Secondary | ICD-10-CM | POA: Diagnosis present

## 2021-07-22 DIAGNOSIS — E119 Type 2 diabetes mellitus without complications: Secondary | ICD-10-CM

## 2021-07-22 DIAGNOSIS — F05 Delirium due to known physiological condition: Secondary | ICD-10-CM | POA: Diagnosis present

## 2021-07-22 DIAGNOSIS — G9341 Metabolic encephalopathy: Secondary | ICD-10-CM | POA: Diagnosis present

## 2021-07-22 DIAGNOSIS — N35912 Unspecified bulbous urethral stricture, male: Secondary | ICD-10-CM | POA: Diagnosis present

## 2021-07-22 DIAGNOSIS — F203 Undifferentiated schizophrenia: Secondary | ICD-10-CM

## 2021-07-22 DIAGNOSIS — Z833 Family history of diabetes mellitus: Secondary | ICD-10-CM | POA: Diagnosis not present

## 2021-07-22 DIAGNOSIS — F0390 Unspecified dementia without behavioral disturbance: Secondary | ICD-10-CM | POA: Diagnosis present

## 2021-07-22 DIAGNOSIS — I2510 Atherosclerotic heart disease of native coronary artery without angina pectoris: Secondary | ICD-10-CM | POA: Diagnosis present

## 2021-07-22 DIAGNOSIS — I480 Paroxysmal atrial fibrillation: Secondary | ICD-10-CM | POA: Diagnosis present

## 2021-07-22 DIAGNOSIS — D649 Anemia, unspecified: Secondary | ICD-10-CM | POA: Diagnosis present

## 2021-07-22 DIAGNOSIS — F209 Schizophrenia, unspecified: Secondary | ICD-10-CM | POA: Diagnosis present

## 2021-07-22 DIAGNOSIS — D696 Thrombocytopenia, unspecified: Secondary | ICD-10-CM

## 2021-07-22 DIAGNOSIS — R413 Other amnesia: Secondary | ICD-10-CM | POA: Diagnosis present

## 2021-07-22 DIAGNOSIS — J449 Chronic obstructive pulmonary disease, unspecified: Secondary | ICD-10-CM | POA: Diagnosis present

## 2021-07-22 DIAGNOSIS — H409 Unspecified glaucoma: Secondary | ICD-10-CM | POA: Diagnosis present

## 2021-07-22 DIAGNOSIS — N401 Enlarged prostate with lower urinary tract symptoms: Secondary | ICD-10-CM | POA: Diagnosis present

## 2021-07-22 DIAGNOSIS — A408 Other streptococcal sepsis: Secondary | ICD-10-CM | POA: Diagnosis present

## 2021-07-22 DIAGNOSIS — F0150 Vascular dementia without behavioral disturbance: Secondary | ICD-10-CM | POA: Diagnosis not present

## 2021-07-22 DIAGNOSIS — J9811 Atelectasis: Secondary | ICD-10-CM | POA: Diagnosis present

## 2021-07-22 DIAGNOSIS — R6520 Severe sepsis without septic shock: Secondary | ICD-10-CM | POA: Diagnosis present

## 2021-07-22 DIAGNOSIS — R9349 Abnormal radiologic findings on diagnostic imaging of other urinary organs: Secondary | ICD-10-CM

## 2021-07-22 DIAGNOSIS — F32A Depression, unspecified: Secondary | ICD-10-CM | POA: Diagnosis present

## 2021-07-22 DIAGNOSIS — L03115 Cellulitis of right lower limb: Secondary | ICD-10-CM | POA: Diagnosis present

## 2021-07-22 DIAGNOSIS — Z7901 Long term (current) use of anticoagulants: Secondary | ICD-10-CM

## 2021-07-22 DIAGNOSIS — A419 Sepsis, unspecified organism: Secondary | ICD-10-CM | POA: Diagnosis present

## 2021-07-22 DIAGNOSIS — R4182 Altered mental status, unspecified: Secondary | ICD-10-CM | POA: Diagnosis present

## 2021-07-22 DIAGNOSIS — Z7982 Long term (current) use of aspirin: Secondary | ICD-10-CM

## 2021-07-22 DIAGNOSIS — Z8673 Personal history of transient ischemic attack (TIA), and cerebral infarction without residual deficits: Secondary | ICD-10-CM | POA: Diagnosis not present

## 2021-07-22 DIAGNOSIS — Z888 Allergy status to other drugs, medicaments and biological substances status: Secondary | ICD-10-CM

## 2021-07-22 DIAGNOSIS — I4891 Unspecified atrial fibrillation: Secondary | ICD-10-CM | POA: Diagnosis not present

## 2021-07-22 DIAGNOSIS — I10 Essential (primary) hypertension: Secondary | ICD-10-CM | POA: Diagnosis present

## 2021-07-22 DIAGNOSIS — E785 Hyperlipidemia, unspecified: Secondary | ICD-10-CM | POA: Diagnosis present

## 2021-07-22 DIAGNOSIS — Z20822 Contact with and (suspected) exposure to covid-19: Secondary | ICD-10-CM | POA: Diagnosis present

## 2021-07-22 DIAGNOSIS — Z79899 Other long term (current) drug therapy: Secondary | ICD-10-CM

## 2021-07-22 DIAGNOSIS — Z7984 Long term (current) use of oral hypoglycemic drugs: Secondary | ICD-10-CM

## 2021-07-22 DIAGNOSIS — R7881 Bacteremia: Secondary | ICD-10-CM | POA: Diagnosis not present

## 2021-07-22 DIAGNOSIS — Z955 Presence of coronary angioplasty implant and graft: Secondary | ICD-10-CM

## 2021-07-22 DIAGNOSIS — B955 Unspecified streptococcus as the cause of diseases classified elsewhere: Secondary | ICD-10-CM | POA: Diagnosis not present

## 2021-07-22 DIAGNOSIS — Z87891 Personal history of nicotine dependence: Secondary | ICD-10-CM

## 2021-07-22 LAB — BLOOD CULTURE ID PANEL (REFLEXED) - BCID2

## 2021-07-22 LAB — COMPREHENSIVE METABOLIC PANEL
ALT: 14 U/L (ref 0–44)
AST: 23 U/L (ref 15–41)
Albumin: 3.5 g/dL (ref 3.5–5.0)
Alkaline Phosphatase: 54 U/L (ref 38–126)
Anion gap: 5 (ref 5–15)
BUN: 17 mg/dL (ref 8–23)
CO2: 31 mmol/L (ref 22–32)
Calcium: 8.7 mg/dL — ABNORMAL LOW (ref 8.9–10.3)
Chloride: 103 mmol/L (ref 98–111)
Creatinine, Ser: 1.1 mg/dL (ref 0.61–1.24)
GFR, Estimated: >60 mL/min (ref >60)
Glucose, Bld: 96 mg/dL (ref 70–99)
Potassium: 4.5 mmol/L (ref 3.5–5.1)
Sodium: 139 mmol/L (ref 135–145)
Total Bilirubin: 0.6 mg/dL (ref 0.3–1.2)
Total Protein: 6.9 g/dL (ref 6.5–8.1)

## 2021-07-22 LAB — CBC WITH DIFFERENTIAL/PLATELET
Abs Immature Granulocytes: 0.06 10*3/uL (ref 0.00–0.07)
Basophils Absolute: 0 10*3/uL (ref 0.0–0.1)
Basophils Relative: 0 %
Eosinophils Absolute: 0 10*3/uL (ref 0.0–0.5)
Eosinophils Relative: 0 %
HCT: 34.6 % — ABNORMAL LOW (ref 39.0–52.0)
Hemoglobin: 11.4 g/dL — ABNORMAL LOW (ref 13.0–17.0)
Immature Granulocytes: 1 %
Lymphocytes Relative: 12 %
Lymphs Abs: 0.8 10*3/uL (ref 0.7–4.0)
MCH: 34 pg (ref 26.0–34.0)
MCHC: 32.9 g/dL (ref 30.0–36.0)
MCV: 103.3 fL — ABNORMAL HIGH (ref 80.0–100.0)
Monocytes Absolute: 0.7 10*3/uL (ref 0.1–1.0)
Monocytes Relative: 10 %
Neutro Abs: 5 10*3/uL (ref 1.7–7.7)
Neutrophils Relative %: 77 %
Platelets: 93 10*3/uL — ABNORMAL LOW (ref 150–400)
RBC: 3.35 MIL/uL — ABNORMAL LOW (ref 4.22–5.81)
RDW: 14.3 % (ref 11.5–15.5)
WBC: 6.5 10*3/uL (ref 4.0–10.5)
nRBC: 0.3 % — ABNORMAL HIGH (ref 0.0–0.2)

## 2021-07-22 LAB — VALPROIC ACID LEVEL: Valproic Acid Lvl: 96 ug/mL (ref 50.0–100.0)

## 2021-07-22 LAB — RESP PANEL BY RT-PCR (FLU A&B, COVID) ARPGX2
Influenza A by PCR: NEGATIVE
Influenza B by PCR: NEGATIVE
SARS Coronavirus 2 by RT PCR: NEGATIVE

## 2021-07-22 LAB — GLUCOSE, CAPILLARY: Glucose-Capillary: 80 mg/dL (ref 70–99)

## 2021-07-22 LAB — PROCALCITONIN: Procalcitonin: <0.1 ng/mL

## 2021-07-22 LAB — URINALYSIS, COMPLETE (UACMP) WITH MICROSCOPIC
Bacteria, UA: NONE SEEN
Bilirubin Urine: NEGATIVE
Glucose, UA: NEGATIVE mg/dL
Ketones, ur: NEGATIVE mg/dL
Leukocytes,Ua: NEGATIVE
Nitrite: NEGATIVE
Protein, ur: 30 mg/dL — AB
Specific Gravity, Urine: 1.015 (ref 1.005–1.030)
pH: >9 — ABNORMAL HIGH (ref 5.0–8.0)

## 2021-07-22 LAB — TROPONIN I (HIGH SENSITIVITY)
Troponin I (High Sensitivity): 11 ng/L (ref <18)
Troponin I (High Sensitivity): 11 ng/L (ref <18)

## 2021-07-22 LAB — LACTIC ACID, PLASMA
Lactic Acid, Venous: 2 mmol/L (ref 0.5–1.9)
Lactic Acid, Venous: 1.3 mmol/L (ref 0.5–1.9)

## 2021-07-22 LAB — PROTIME-INR
INR: 1 (ref 0.8–1.2)
Prothrombin Time: 13.6 seconds (ref 11.4–15.2)

## 2021-07-22 LAB — APTT: aPTT: 30 seconds (ref 24–36)

## 2021-07-22 LAB — SAVE SMEAR(SSMR), FOR PROVIDER SLIDE REVIEW

## 2021-07-22 LAB — MRSA NEXT GEN BY PCR, NASAL: MRSA by PCR Next Gen: NOT DETECTED

## 2021-07-22 MED ORDER — ONDANSETRON HCL 4 MG PO TABS: 4.0000 mg | ORAL_TABLET | Freq: Four times a day (QID) | ORAL | Status: DC | PRN

## 2021-07-22 MED ORDER — RISPERIDONE 3 MG PO TABS
4.0000 mg | ORAL_TABLET | Freq: Every day | ORAL | Status: DC
  Administered 2021-07-22 – 2021-07-25 (×4): 4 mg via ORAL
  Filled 2021-07-22 (×5): qty 1

## 2021-07-22 MED ORDER — ACETAMINOPHEN 650 MG RE SUPP
650.0000 mg | Freq: Once | RECTAL | Status: AC
  Administered 2021-07-22: 650 mg via RECTAL
  Filled 2021-07-22: qty 1

## 2021-07-22 MED ORDER — CITALOPRAM HYDROBROMIDE 20 MG PO TABS
20.0000 mg | ORAL_TABLET | Freq: Every day | ORAL | Status: DC
  Administered 2021-07-22 – 2021-07-26 (×5): 20 mg via ORAL
  Filled 2021-07-22 (×5): qty 1

## 2021-07-22 MED ORDER — LATANOPROST 0.005 % OP SOLN
1.0000 [drp] | Freq: Every day | OPHTHALMIC | Status: DC
  Administered 2021-07-22 – 2021-07-25 (×4): 1 [drp] via OPHTHALMIC
  Filled 2021-07-22: qty 2.5

## 2021-07-22 MED ORDER — PANTOPRAZOLE SODIUM 40 MG PO TBEC
40.0000 mg | DELAYED_RELEASE_TABLET | Freq: Every day | ORAL | Status: DC
  Administered 2021-07-22 – 2021-07-26 (×5): 40 mg via ORAL
  Filled 2021-07-22 (×5): qty 1

## 2021-07-22 MED ORDER — BRIMONIDINE TARTRATE-TIMOLOL 0.2-0.5 % OP SOLN
1.0000 [drp] | Freq: Two times a day (BID) | OPHTHALMIC | Status: DC
  Filled 2021-07-22: qty 5

## 2021-07-22 MED ORDER — LOSARTAN POTASSIUM 25 MG PO TABS
25.0000 mg | ORAL_TABLET | Freq: Every day | ORAL | Status: DC
  Administered 2021-07-22 – 2021-07-26 (×5): 25 mg via ORAL
  Filled 2021-07-22 (×5): qty 1

## 2021-07-22 MED ORDER — ATORVASTATIN CALCIUM 20 MG PO TABS
10.0000 mg | ORAL_TABLET | Freq: Every day | ORAL | Status: DC
  Administered 2021-07-22 – 2021-07-26 (×5): 10 mg via ORAL
  Filled 2021-07-22 (×4): qty 1
  Filled 2021-07-22: qty 0.5
  Filled 2021-07-22 (×3): qty 1

## 2021-07-22 MED ORDER — KETOROLAC TROMETHAMINE 15 MG/ML IJ SOLN
15.0000 mg | Freq: Four times a day (QID) | INTRAMUSCULAR | Status: DC | PRN
  Filled 2021-07-22: qty 1

## 2021-07-22 MED ORDER — BRIMONIDINE TARTRATE 0.2 % OP SOLN
1.0000 [drp] | Freq: Two times a day (BID) | OPHTHALMIC | Status: DC
  Administered 2021-07-22 – 2021-07-26 (×8): 1 [drp] via OPHTHALMIC
  Filled 2021-07-22: qty 5

## 2021-07-22 MED ORDER — LACTATED RINGERS IV BOLUS (SEPSIS)
1000.0000 mL | Freq: Once | INTRAVENOUS | Status: AC
  Administered 2021-07-22: 1000 mL via INTRAVENOUS

## 2021-07-22 MED ORDER — VANCOMYCIN HCL 2000 MG/400ML IV SOLN
2000.0000 mg | Freq: Once | INTRAVENOUS | Status: AC
  Administered 2021-07-22: 2000 mg via INTRAVENOUS
  Filled 2021-07-22: qty 400

## 2021-07-22 MED ORDER — ACETAMINOPHEN 650 MG RE SUPP: 650.0000 mg | Freq: Four times a day (QID) | RECTAL | Status: DC | PRN

## 2021-07-22 MED ORDER — METRONIDAZOLE 500 MG/100ML IV SOLN
500.0000 mg | Freq: Two times a day (BID) | INTRAVENOUS | Status: DC
Start: 2021-07-22 — End: 2021-07-22

## 2021-07-22 MED ORDER — DONEPEZIL HCL 5 MG PO TABS
10.0000 mg | ORAL_TABLET | Freq: Every day | ORAL | Status: DC
  Administered 2021-07-22 – 2021-07-25 (×4): 10 mg via ORAL
  Filled 2021-07-22 (×4): qty 2

## 2021-07-22 MED ORDER — CARVEDILOL 6.25 MG PO TABS
6.2500 mg | ORAL_TABLET | Freq: Two times a day (BID) | ORAL | Status: DC
  Administered 2021-07-23 – 2021-07-26 (×7): 6.25 mg via ORAL
  Filled 2021-07-22 (×7): qty 1

## 2021-07-22 MED ORDER — ACETAMINOPHEN 325 MG PO TABS: 650.0000 mg | ORAL_TABLET | Freq: Four times a day (QID) | ORAL | Status: DC | PRN

## 2021-07-22 MED ORDER — METFORMIN HCL 500 MG PO TABS: 500.0000 mg | ORAL_TABLET | Freq: Two times a day (BID) | ORAL | Status: DC

## 2021-07-22 MED ORDER — APIXABAN 5 MG PO TABS
5.0000 mg | ORAL_TABLET | Freq: Two times a day (BID) | ORAL | Status: DC
  Administered 2021-07-22 – 2021-07-26 (×8): 5 mg via ORAL
  Filled 2021-07-22 (×8): qty 1

## 2021-07-22 MED ORDER — SODIUM CHLORIDE 0.9 % IV SOLN
2.0000 g | Freq: Once | INTRAVENOUS | Status: AC
  Administered 2021-07-22: 2 g via INTRAVENOUS
  Filled 2021-07-22: qty 2

## 2021-07-22 MED ORDER — SODIUM CHLORIDE 0.9 % IV SOLN
2.0000 g | Freq: Three times a day (TID) | INTRAVENOUS | Status: DC
  Administered 2021-07-22: 2 g via INTRAVENOUS
  Filled 2021-07-22 (×3): qty 2

## 2021-07-22 MED ORDER — POLYETHYLENE GLYCOL 3350 17 G PO PACK: 17.0000 g | PACK | Freq: Every day | ORAL | Status: DC | PRN

## 2021-07-22 MED ORDER — METRONIDAZOLE 500 MG/100ML IV SOLN
500.0000 mg | Freq: Once | INTRAVENOUS | Status: AC
  Administered 2021-07-22: 500 mg via INTRAVENOUS
  Filled 2021-07-22: qty 100

## 2021-07-22 MED ORDER — TRAZODONE HCL 50 MG PO TABS
25.0000 mg | ORAL_TABLET | Freq: Every evening | ORAL | Status: DC | PRN
  Administered 2021-07-23: 22:00:00 25 mg via ORAL
  Filled 2021-07-22: qty 1

## 2021-07-22 MED ORDER — METRONIDAZOLE 500 MG/100ML IV SOLN: 500.0000 mg | Freq: Two times a day (BID) | INTRAVENOUS | Status: DC

## 2021-07-22 MED ORDER — INSULIN ASPART 100 UNIT/ML IJ SOLN
0.0000 [IU] | Freq: Three times a day (TID) | INTRAMUSCULAR | Status: DC
  Administered 2021-07-23: 1 [IU] via SUBCUTANEOUS
  Filled 2021-07-22: qty 1

## 2021-07-22 MED ORDER — TIMOLOL MALEATE 0.5 % OP SOLN
1.0000 [drp] | Freq: Two times a day (BID) | OPHTHALMIC | Status: DC
  Administered 2021-07-22 – 2021-07-26 (×8): 1 [drp] via OPHTHALMIC
  Filled 2021-07-22: qty 5

## 2021-07-22 MED ORDER — LACTATED RINGERS IV SOLN: INTRAVENOUS | Status: DC

## 2021-07-22 MED ORDER — DIVALPROEX SODIUM ER 500 MG PO TB24
2000.0000 mg | ORAL_TABLET | Freq: Every day | ORAL | Status: DC
  Administered 2021-07-22 – 2021-07-25 (×4): 2000 mg via ORAL
  Filled 2021-07-22 (×5): qty 4

## 2021-07-22 MED ORDER — IOHEXOL 350 MG/ML SOLN
75.0000 mL | Freq: Once | INTRAVENOUS | Status: AC | PRN
  Administered 2021-07-22: 75 mL via INTRAVENOUS

## 2021-07-22 MED ORDER — ASPIRIN EC 81 MG PO TBEC
81.0000 mg | DELAYED_RELEASE_TABLET | Freq: Every day | ORAL | Status: DC
  Administered 2021-07-23 – 2021-07-26 (×4): 81 mg via ORAL
  Filled 2021-07-22 (×4): qty 1

## 2021-07-22 MED ORDER — IBUPROFEN 400 MG PO TABS
600.0000 mg | ORAL_TABLET | Freq: Four times a day (QID) | ORAL | Status: DC | PRN
  Administered 2021-07-22: 600 mg via ORAL
  Filled 2021-07-22: qty 1

## 2021-07-22 MED ORDER — BENZTROPINE MESYLATE 1 MG PO TABS
1.0000 mg | ORAL_TABLET | Freq: Every day | ORAL | Status: DC
  Administered 2021-07-22 – 2021-07-25 (×4): 1 mg via ORAL
  Filled 2021-07-22 (×5): qty 1

## 2021-07-22 MED ORDER — VANCOMYCIN HCL IN DEXTROSE 1-5 GM/200ML-% IV SOLN: 1000.0000 mg | Freq: Once | INTRAVENOUS | Status: DC

## 2021-07-22 MED ORDER — SODIUM CHLORIDE 0.9% FLUSH
3.0000 mL | Freq: Two times a day (BID) | INTRAVENOUS | Status: DC
  Administered 2021-07-22 – 2021-07-25 (×8): 3 mL via INTRAVENOUS

## 2021-07-22 MED ORDER — ONDANSETRON HCL 4 MG/2ML IJ SOLN: 4.0000 mg | Freq: Four times a day (QID) | INTRAMUSCULAR | Status: DC | PRN

## 2021-07-22 MED ORDER — VANCOMYCIN HCL 750 MG/150ML IV SOLN
750.0000 mg | Freq: Two times a day (BID) | INTRAVENOUS | Status: DC
  Administered 2021-07-22: 750 mg via INTRAVENOUS
  Filled 2021-07-22 (×2): qty 150

## 2021-07-22 MED ORDER — METRONIDAZOLE 500 MG/100ML IV SOLN
500.0000 mg | Freq: Two times a day (BID) | INTRAVENOUS | Status: DC
Start: 2021-07-22 — End: 2021-07-23
  Administered 2021-07-22: 22:00:00 500 mg via INTRAVENOUS
  Filled 2021-07-22 (×2): qty 100

## 2021-07-22 NOTE — ED Notes (Signed)
EMS IV access unable to draw blood, this RN and Associate Professor attempted IV insertion x3 without success, EDP notified, IV team consult placed.

## 2021-07-22 NOTE — ED Notes (Signed)
Pt Dr Larinda Buttery, d/c one bag of IVF. One bag of IVF LR stopped.

## 2021-07-22 NOTE — H&P (Signed)
[facility] History and Physical  Brian Dodson [id number] DOB: 21-Mar-1956 DOA: 07/22/2021  Referring physician: Dr. Larinda ButteryJessup PCP: Galvin ProfferHague, Imran P, MD   Chief Complaint: Altered mental status  HPI: Brian Dodson is a 65 y.o. male with history of schizophrenia living long-term in a nursing home, CAD, A. fib, hypertension, type 2 diabetes, dementia, who presents with fever from his nursing home.  Patient seen and examined at bedside with his niece present, she reports she is the only family member who regularly visited him.  She reports he has seemed off for about 2 weeks.  Today staff at his nursing home found him shaking with chills and seeming to be less responsive and not quite like himself.  EMS was called and found to have a temperature of 102.3 axillary as well as tachycardia, he was transported to the [facility] ED.  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  He loves to sing gospel music and go outside to look for for [employer].  She reports that he is definitely not himself.  On interview patient endorses occasional back pain but denies cough, chest pain, shortness of breath, abdominal pain, burning or pain with urination, or neck pain.  In the ED initial vital signs notable for temperature to 103.2 Fahrenheit oral, tachycardia to the low 100s, tachypnea in the low 20s, and normal blood pressures.  Lab work-up showed a CBC with normal white count, mild anemia at his baseline, and thrombocytopenia down to 93, his baseline is usually in the 130s to 140s.  Lactic acid borderline elevated at 2.0 on arrival, down trended to 1.3; CMP, troponin, procalcitonin were all normal.  INR and PTT were normal.  Depakote level was normal.  Test for COVID and influenza were negative.  UA notable only for a very high pH greater than 9, otherwise normal.  Chest x-ray did not show any evidence of infiltrates.  CT head without contrast  showed no acute findings.  CT abdomen and pelvis showed small amount of bilateral perinephric fluid but no other acute findings.  EKG showed A. fib, no significant change from priors.  Blood and urine cultures were collected, he was given Tylenol, 3 L LR bolus, and started on cefepime, Flagyl, and vancomycin.  Review of Systems:  Pertinent positives and negative per HPI, all others reviewed and negative  Past Medical History:  Diagnosis Date   Asthma    Atrial fibrillation (HCC)    Benign prostatic hypertrophy with lower urinary tract symptoms (LUTS)    Bulbous urethral stricture    CAD (coronary artery disease)    s/p coronary stent 2003   Constipation    COPD (chronic obstructive pulmonary disease) (HCC)    Diabetes mellitus without complication (HCC)    Dizziness    Dyspnea    GERD (gastroesophageal reflux disease)    Gross hematuria    Hyperlipemia    Hypertension    Lumbago    Palpitation    Schizophrenia (HCC)    Past Surgical History:  Procedure Laterality Date   COLONOSCOPY     CORONARY ANGIOPLASTY WITH STENT PLACEMENT  11/2003   kidney stent     Social History:  reports that he has quit smoking. He has never used smokeless tobacco. He reports that he does not drink alcohol and does not use drugs.  Allergies  Allergen Reactions   Xarelto [Rivaroxaban]     Family History  Problem  Relation Age of Onset   Diabetes Other    Bladder Cancer Neg Hx    Kidney cancer Neg Hx    Prostate cancer Neg Hx      Prior to Admission medications   Medication Sig Start Date End Date Taking? Authorizing Provider  amiodarone (PACERONE) 200 MG tablet Take 0.5 tablets (100 mg total) by mouth daily. Patient taking differently: Take 200 mg by mouth daily. 05/23/17  Yes Houston Siren, MD  aspirin 81 MG tablet Take 81 mg by mouth daily.    Yes [provider]  atorvastatin (LIPITOR) 10 MG tablet Take 10 mg by mouth daily. 05/18/15  Yes [provider]  benztropine  (COGENTIN) 1 MG tablet Take 1 mg by mouth at bedtime.    Yes [provider]  brimonidine-timolol (COMBIGAN) 0.2-0.5 % ophthalmic solution Place 1 drop into both eyes every 12 (twelve) hours. Wait 3 to 5 minutes between drops   Yes [provider]  carvedilol (COREG) 6.25 MG tablet Take 6.25 mg by mouth 2 (two) times daily with a meal. 07/05/21  Yes [provider]  Cholecalciferol (EQL VITAMIN D3) 50 MCG (2000 UT) CAPS Take 1 capsule by mouth daily.   Yes [provider]  citalopram (CELEXA) 20 MG tablet Take 20 mg by mouth daily.   Yes [provider]  diclofenac Sodium (VOLTAREN) 1 % GEL Apply 2 g topically 4 (four) times daily. Apply to neck and shoulders   Yes [provider]  divalproex (DEPAKOTE ER) 500 MG 24 hr tablet Take 2,000 mg by mouth at bedtime.    Yes [provider]  donepezil (ARICEPT) 10 MG tablet Take 10 mg by mouth at bedtime.    Yes [provider]  ELIQUIS 5 MG TABS tablet Take 5 mg by mouth 2 (two) times daily. 05/18/15  Yes [provider]  ferrous sulfate 325 (65 FE) MG tablet Take 325 mg by mouth daily with breakfast.   Yes [provider]  furosemide (LASIX) 20 MG tablet Take 20 mg by mouth daily as needed. For edema (swelling) 06/22/21  Yes [provider]  lanolin/mineral oil (KERI/THERA-DERM) LOTN Apply 1 application topically as needed for dry skin. Apply to face after washing face daily   Yes [provider]  losartan (COZAAR) 25 MG tablet Take 1 tablet (25 mg total) by mouth daily. May increase to 2 tablets by mouth daily if blood pressure goes above 140/90 for more than a day  or if the blood pressure goes above 150/100 at all. Patient taking differently: Take 25 mg by mouth daily. 11/18/18 05/03/22 Yes Arnaldo Natal, MD  LUMIGAN 0.01 % SOLN Place 1 drop into both eyes at bedtime. 07/06/21  Yes [provider]  melatonin 5 MG TABS Take 5 mg by mouth at  bedtime.   Yes [provider]  Menthol, Topical Analgesic, (BIOFREEZE) 4 % GEL Apply 1 application topically 3 (three) times daily. To lower back area as needed for pain   Yes [provider]  metFORMIN (GLUCOPHAGE) 500 MG tablet Take 500 mg by mouth 2 (two) times daily with a meal.   Yes [provider]  omeprazole (PRILOSEC) 20 MG capsule Take 20 mg by mouth 2 (two) times daily before a meal. May open and sprinkle in applesauce.   Yes [provider]  [name] (OYSTER CALCIUM) 500 MG TABS tablet Take 500 mg of elemental calcium by mouth 2 (two) times daily.   Yes  [provider]  risperidone (RISPERDAL) 4 MG tablet Take 4 mg by mouth at bedtime. 05/18/15  Yes [provider]  vitamin C (ASCORBIC ACID) 250 MG tablet Take 1 tablet (250 mg total) by mouth daily. 03/05/21  Yes Drema Dallas, MD  acetaminophen (TYLENOL) 500 MG tablet Take 1,000 mg by mouth every 6 (six) hours as needed for mild pain. Patient not taking: Reported on 07/22/2021    [provider]  amLODipine (NORVASC) 5 MG tablet Take 5 mg by mouth daily. Patient not taking: Reported on 07/22/2021    [provider]  latanoprost (XALATAN) 0.005 % ophthalmic solution Place 1 drop into both eyes at bedtime. Patient not taking: Reported on 07/22/2021    [provider]  sulfamethoxazole-trimethoprim (BACTRIM DS) 800-160 MG tablet Take 1 tablet by mouth every 12 (twelve) hours. Patient not taking: Reported on 07/22/2021 03/05/21   Drema Dallas, MD   Physical Exam: Vitals:   07/22/21 1159 07/22/21 1200 07/22/21 1230 07/22/21 1237  BP:  107/60 138/77 138/77  Pulse: 91 90 86 87  Resp: (!) 26 (!) 24 (!) 21 (!) 24  Temp:    (!) 103 F (39.4 C)  TempSrc:    Oral  SpO2: 100% 100% 100% 100%  Weight:      Height:        Wt Readings from Last 3 Encounters:  07/22/21 99.8 kg  03/02/21 98.6 kg  11/21/18 106.1 kg     General:  Appears calm and comfortable,  NAD. Responding yes or no to questions. Eyes: PERRL, normal lids, irises & conjunctiva ENT: grossly normal hearing, lips & tongue Neck: no masses, normal range of motion Cardiovascular: RRR, no m/r/g.  Mild lower extremity edema.  Respiratory: CTA bilaterally, no w/r/r. Normal respiratory effort. Abdomen: soft, ntnd. No masses. No CVA tenderness bilaterally. Skin: no rash or induration seen on limited exam Musculoskeletal: grossly normal tone BUE/BLE Psychiatric: calm affect. Speech garbled. Neurologic: grossly non-focal.          Labs on Admission:  Basic Metabolic Panel: Recent Labs  Lab 07/22/21 0849  NA 139  K 4.5  CL 103  CO2 31  GLUCOSE 96  BUN 17  CREATININE 1.10  CALCIUM 8.7*   Liver Function Tests: Recent Labs  Lab 07/22/21 0849  AST 23  ALT 14  ALKPHOS 54  BILITOT 0.6  PROT 6.9  ALBUMIN 3.5   No results for input(s): LIPASE, AMYLASE in the last 168 hours. No results for input(s): AMMONIA in the last 168 hours. CBC: Recent Labs  Lab 07/22/21 0849  WBC 6.5  NEUTROABS 5.0  HGB 11.4*  HCT 34.6*  MCV 103.3*  PLT 93*   Cardiac Enzymes: No results for input(s): CKTOTAL, CKMB, CKMBINDEX, TROPONINI in the last 168 hours.  BNP (last 3 results) No results for input(s): BNP in the last 8760 hours.  ProBNP (last 3 results) No results for input(s): PROBNP in the last 8760 hours.  CBG: No results for input(s): GLUCAP in the last 168 hours.  Radiological Exams on Admission: CT Head Wo Contrast  Result Date: 07/22/2021 CLINICAL DATA:  Mental status change, atrial fibrillation, dementia EXAM: CT HEAD WITHOUT CONTRAST TECHNIQUE: Contiguous axial images were obtained from the base of the skull through the vertex without intravenous contrast. COMPARISON:  03/02/2021 FINDINGS: Brain: Atrophy and white matter microvascular ischemic changes throughout both cerebral hemispheres. No acute intracranial hemorrhage, new mass lesion, definite infarction, midline shift,  herniation, hydrocephalus, or extra-axial fluid collection. No  focal mass effect or edema. Cisterns are patent. Cerebellar atrophy as well. Vascular: No hyperdense vessel or unexpected calcification. Skull: Normal. Negative for fracture or focal lesion. Sinuses/Orbits: No acute finding. Other: None. IMPRESSION: Chronic atrophy and white matter microvascular ischemic changes. No acute intracranial abnormality by noncontrast CT. Electronically Signed   By: Judie Petit.  Shick M.D.   On: 07/22/2021 12:09   CT Abdomen Pelvis W Contrast  Result Date: 07/22/2021 CLINICAL DATA:  Evaluate for abdominal abscess or infection. EXAM: CT ABDOMEN AND PELVIS WITH CONTRAST TECHNIQUE: Multidetector CT imaging of the abdomen and pelvis was performed using the standard protocol following bolus administration of intravenous contrast. CONTRAST:  37mL OMNIPAQUE IOHEXOL 350 MG/ML SOLN COMPARISON:  CT abdomen pelvis 11/21/2005. FINDINGS: Lower chest: Normal heart size. Dependent atelectasis within the bilateral lower lobes. Hepatobiliary: Streak artifact limits evaluation. Liver is normal in size and contour. Gallbladder is unremarkable. Pancreas: Unremarkable Spleen: Unremarkable Adrenals/Urinary Tract: Normal adrenal glands. Kidneys enhance symmetrically with contrast. Small amount of perinephric fluid bilaterally. No hydronephrosis. Urinary bladder is unremarkable. Stomach/Bowel: Stool throughout the colon. No abnormal bowel wall thickening or evidence for bowel obstruction. No free fluid or free intraperitoneal air. Normal appendix. Normal morphology of the stomach. Vascular/Lymphatic: Normal caliber abdominal aorta. No retroperitoneal lymphadenopathy. Reproductive: Heterogeneous prostate. Other: None. Musculoskeletal: Lower thoracic and lumbar spine degenerative changes. No aggressive or acute appearing osseous lesions. IMPRESSION: Limited exam due to streak artifact. Small amount of perinephric fluid bilaterally. Recommend correlation with  urinalysis to exclude urinary tract infection. Otherwise no acute process within the abdomen or pelvis. Electronically Signed   By: Annia Belt M.D.   On: 07/22/2021 12:13   DG Chest Port 1 View  Result Date: 07/22/2021 CLINICAL DATA:  Altered mental status, unresponsive, fever, sepsis evaluation EXAM: PORTABLE CHEST 1 VIEW COMPARISON:  03/02/2021 FINDINGS: Low lung volumes with vascular prominence and basilar atelectasis. Heart is enlarged. No focal pneumonia, acute airspace process, effusion, or edema. No pneumothorax. Trachea midline. Degenerative changes of the spine. IMPRESSION: Low volume exam with cardiomegaly and basilar atelectasis. Electronically Signed   By: Judie Petit.  Shick M.D.   On: 07/22/2021 10:35    EKG: Independently reviewed.  A. fib, tachycardic, no acute ischemic changes, compared to prior no significant changes.  Assessment/Plan Principal Problem:   Sepsis (HCC) Active Problems:   Atrial fibrillation with RVR (HCC)   Schizophrenia (HCC)   Diabetes mellitus, type II (HCC)   HTN (hypertension)   CAD (coronary artery disease)   Loss of memory   Dementia (HCC)   AMARIE TARTE is a 65 y.o. male with history of schizophrenia living long-term in a nursing home, CAD, A. fib, hypertension, type 2 diabetes, dementia, who presents with fever and altered mental status from his nursing home with findings consistent with sepsis of unknown origin.  #Sepsis Unclear etiology at present.  No signs on exam to suggest meningitis.  Chest x-ray without any opacities patient is not endorsing any URI symptoms.  Denies any abdominal symptoms and CT abdomen pelvis is unremarkable with exception of some mild perinephric fluid, UA not very suggestive of infection though pH is unusual, will obtain renal ultrasound.  No report of new skin breakdown or other skin infection.  At this point suspect viral illness, will send extended respiratory viral panel.  Low threshold to send stool studies if he should  develop diarrhea. - Continue vancomycin, cefepime, Flagyl - Follow-up urine and blood cultures - Renal ultrasound ordered - Follow-up expanded respiratory viral panel - LR at  100 cc an hour - Follow-up repeat procalcitonin  #Delirium Secondary to sepsis, expect improvement as patient is treated for the above.  #Thrombocytopenia Has had platelets below 100 occasionally in the past on chart review.  Suspect secondary to his current acute illness, will send a smear.  Also has a recent prescription for Bactrim on file which is a possible cause.  Trend CBC.  #Chronic medical problems A. fib-continue apixaban CAD-continue aspirin, atorvastatin, carvedilol Depression-continue citalopram Schizophrenia-continue benztropine, risperidone Glaucoma-continue Combigan, latanoprost Hypertension-continue losartan DM2-continue metformin, sensitive insulin sliding scale as well Dementia-continue Depakote, donepezil GERD-continue PPI  Code Status: Full code DVT Prophylaxis: On full dose anticoagulation Family Communication: Niece updated at bedside Disposition Plan: Inpatient, MedSurg  Time spent: 39 min  Venora Maples MD/[facility]  Note:  This document was prepared using Conservation officer, historic buildings and may include unintentional dictation errors.

## 2021-07-22 NOTE — ED Triage Notes (Signed)
Pt from [facility], pt with hx of Afib and dementia. Pt with c/o fever. Pt sleepy and mumbling on arrival. Pt with c/o pain with urination. EDP in room.

## 2021-07-22 NOTE — ED Notes (Signed)
EDP and MD notified of pt's repeat temp

## 2021-07-22 NOTE — ED Notes (Signed)
Left arm placed on arm board, able to infuse fluids through EMS IV access.

## 2021-07-22 NOTE — ED Notes (Signed)
Pt trying to get OOB, pt states he needs to use the bathroom, pt helped with urinal but pt had already urinated.Pt cleaned, bedding changed, brief applied. Pt to CT

## 2021-07-22 NOTE — ED Provider Notes (Signed)
[facility] Emergency Department Provider Note   ____________________________________________   Event Date/Time   First MD Initiated Contact with Patient 07/22/21 [phone]     (approximate)  I have reviewed the triage vital signs and the nursing notes.   HISTORY  Chief Complaint Fever    HPI Brian Dodson is a 65 y.o. male with past medical history of hypertension, hyperlipidemia, diabetes, CAD, atrial fibrillation on Eliquis, stroke, schizophrenia, and dementia who presents to the ED for altered mental status.  History is limited due to history of dementia and patient currently with garbled speech pattern.  Per EMS, patient noted by staff at his nursing facility to be shaking and shivering when they went to check on him this morning.  He also reportedly seemed to less responsive than usual.  EMS found patient to have a temperature of 102.3 axillary, also noted tachycardia with atrial fibrillation.  Staff at his facility had not noticed any recent fevers.  Patient unable to provide any additional history at this time.        Past Medical History:  Diagnosis Date   Asthma    Atrial fibrillation (HCC)    Benign prostatic hypertrophy with lower urinary tract symptoms (LUTS)    Bulbous urethral stricture    CAD (coronary artery disease)    s/p coronary stent 2003   Constipation    COPD (chronic obstructive pulmonary disease) (HCC)    Diabetes mellitus without complication (HCC)    Dizziness    Dyspnea    GERD (gastroesophageal reflux disease)    Gross hematuria    Hyperlipemia    Hypertension    Lumbago    Palpitation    Schizophrenia (HCC)     Patient Active Problem List   Diagnosis Date Noted   Sepsis secondary to UTI (HCC) 03/05/2021   Acute metabolic encephalopathy 03/05/2021   Dementia (HCC) 03/05/2021   Memory loss due to medical condition 03/05/2021   AF (paroxysmal atrial fibrillation) (HCC) 03/05/2021   Atrial flutter (HCC) 03/05/2021    Diabetes mellitus type 2, controlled, without complications (HCC) 03/05/2021   Obese 03/05/2021   Anemia of chronic disease 03/04/2021   Acute encephalopathy 03/02/2021   Acute lower UTI 03/02/2021   Unspecified atrial fibrillation (HCC) 03/02/2021   Acute febrile illness    Bilateral lower extremity edema 04/01/2018   Lower extremity pain, bilateral 04/01/2018   Swelling of limb 10/04/2017   Sepsis (HCC) 05/20/2017   Anemia 05/07/2017   B12 deficiency 05/07/2017   Loss of memory 05/07/2017   Atrial fibrillation with RVR (HCC) 05/23/2015   Syncope 05/23/2015   Schizophrenia (HCC) 05/23/2015   Diabetes mellitus, type II (HCC) 05/23/2015   HTN (hypertension) 05/23/2015   GERD (gastroesophageal reflux disease) 05/23/2015   CAD (coronary artery disease) 05/23/2015    Past Surgical History:  Procedure Laterality Date   COLONOSCOPY     CORONARY ANGIOPLASTY WITH STENT PLACEMENT  11/2003   kidney stent      Prior to Admission medications   Medication Sig Start Date End Date Taking? Authorizing Provider  amiodarone (PACERONE) 200 MG tablet Take 0.5 tablets (100 mg total) by mouth daily. Patient taking differently: Take 200 mg by mouth daily. 05/23/17  Yes Houston Siren, MD  aspirin 81 MG tablet Take 81 mg by mouth daily.    Yes [provider]  atorvastatin (LIPITOR) 10 MG tablet Take 10 mg by mouth daily. 05/18/15  Yes [provider]  benztropine (COGENTIN) 1 MG tablet  Take 1 mg by mouth at bedtime.    Yes [provider]  brimonidine-timolol (COMBIGAN) 0.2-0.5 % ophthalmic solution Place 1 drop into both eyes every 12 (twelve) hours. Wait 3 to 5 minutes between drops   Yes [provider]  carvedilol (COREG) 6.25 MG tablet Take 6.25 mg by mouth 2 (two) times daily with a meal. 07/05/21  Yes [provider]  Cholecalciferol (EQL VITAMIN D3) 50 MCG (2000 UT) CAPS Take 1 capsule by mouth daily.   Yes [provider]  citalopram  (CELEXA) 20 MG tablet Take 20 mg by mouth daily.   Yes [provider]  diclofenac Sodium (VOLTAREN) 1 % GEL Apply 2 g topically 4 (four) times daily. Apply to neck and shoulders   Yes [provider]  divalproex (DEPAKOTE ER) 500 MG 24 hr tablet Take 2,000 mg by mouth at bedtime.    Yes [provider]  donepezil (ARICEPT) 10 MG tablet Take 10 mg by mouth at bedtime.    Yes [provider]  ELIQUIS 5 MG TABS tablet Take 5 mg by mouth 2 (two) times daily. 05/18/15  Yes [provider]  ferrous sulfate 325 (65 FE) MG tablet Take 325 mg by mouth daily with breakfast.   Yes [provider]  furosemide (LASIX) 20 MG tablet Take 20 mg by mouth daily as needed. For edema (swelling) 06/22/21  Yes [provider]  lanolin/mineral oil (KERI/THERA-DERM) LOTN Apply 1 application topically as needed for dry skin. Apply to face after washing face daily   Yes [provider]  losartan (COZAAR) 25 MG tablet Take 1 tablet (25 mg total) by mouth daily. May increase to 2 tablets by mouth daily if blood pressure goes above 140/90 for more than a day  or if the blood pressure goes above 150/100 at all. Patient taking differently: Take 25 mg by mouth daily. 11/18/18 05/03/22 Yes Arnaldo NatalMalinda, Paul F, MD  LUMIGAN 0.01 % SOLN Place 1 drop into both eyes at bedtime. 07/06/21  Yes [provider]  melatonin 5 MG TABS Take 5 mg by mouth at bedtime.   Yes [provider]  Menthol, Topical Analgesic, (BIOFREEZE) 4 % GEL Apply 1 application topically 3 (three) times daily. To lower back area as needed for pain   Yes [provider]  metFORMIN (GLUCOPHAGE) 500 MG tablet Take 500 mg by mouth 2 (two) times daily with a meal.   Yes [provider]  omeprazole (PRILOSEC) 20 MG capsule Take 20 mg by mouth 2 (two) times daily before a meal. May open and sprinkle in applesauce.   Yes [provider]  Ethelda Chickyster Shell (OYSTER CALCIUM)  500 MG TABS tablet Take 500 mg of elemental calcium by mouth 2 (two) times daily.   Yes [provider]  risperidone (RISPERDAL) 4 MG tablet Take 4 mg by mouth at bedtime. 05/18/15  Yes [provider]  vitamin C (ASCORBIC ACID) 250 MG tablet Take 1 tablet (250 mg total) by mouth daily. 03/05/21  Yes Drema DallasWoods, Curtis J, MD  acetaminophen (TYLENOL) 500 MG tablet Take 1,000 mg by mouth every 6 (six) hours as needed for mild pain. Patient not taking: Reported on 07/22/2021    [provider]  amLODipine (NORVASC) 5 MG tablet Take 5 mg by mouth daily. Patient not taking: Reported on 07/22/2021    [provider]  latanoprost (XALATAN) 0.005 % ophthalmic solution Place 1 drop into both eyes at bedtime. Patient not taking: Reported on  07/22/2021    [provider]  sulfamethoxazole-trimethoprim (BACTRIM DS) 800-160 MG tablet Take 1 tablet by mouth every 12 (twelve) hours. Patient not taking: Reported on 07/22/2021 03/05/21   Drema Dallas, MD    Allergies Xarelto [rivaroxaban]  Family History  Problem Relation Age of Onset   Diabetes Other    Bladder Cancer Neg Hx    Kidney cancer Neg Hx    Prostate cancer Neg Hx     Social History Social History   Tobacco Use   Smoking status: Former   Smokeless tobacco: Never  Substance Use Topics   Alcohol use: No   Drug use: No    Review of Systems Unable to obtain secondary to dementia and altered mental status  ____________________________________________   PHYSICAL EXAM:  VITAL SIGNS: ED Triage Vitals  Enc Vitals Group     BP 07/22/21 0843 127/88     Pulse Rate 07/22/21 0843 (!) 103     Resp 07/22/21 0843 (!) 22     Temp 07/22/21 0843 (!) 103.2 F (39.6 C)     Temp Source 07/22/21 0843 Oral     SpO2 07/22/21 0843 99 %     Weight 07/22/21 0844 220 lb (99.8 kg)     Height 07/22/21 0844 6' (1.829 m)     Head Circumference --      Peak Flow --      Pain Score --      Pain Loc --      Pain Edu?  --      Excl. in GC? --     Constitutional: Awake and alert. Eyes: Conjunctivae are normal.  Pupils equal, round, and reactive to light bilaterally. Head: Atraumatic. Nose: No congestion/rhinnorhea. Mouth/Throat: Mucous membranes are moist. Neck: Normal ROM Cardiovascular: Tachycardic, irregularly irregular rhythm. Grossly normal heart sounds. Respiratory: Normal respiratory effort.  No retractions. Lungs CTAB. Gastrointestinal: Soft and nontender. No distention. Genitourinary: deferred Musculoskeletal: 1+ pitting edema to knees bilaterally, no lower extremity tenderness to palpation. Neurologic: Slurred speech pattern. No gross focal neurologic deficits are appreciated. Skin:  Skin is warm, dry and intact. No rash noted. Psychiatric: Unable to assess.  ____________________________________________   LABS (all labs ordered are listed, but only abnormal results are displayed)  Labs Reviewed  LACTIC ACID, PLASMA - Abnormal; Notable for the following components:      Result Value   Lactic Acid, Venous 2.0 (*)    All other components within normal limits  COMPREHENSIVE METABOLIC PANEL - Abnormal; Notable for the following components:   Calcium 8.7 (*)    All other components within normal limits  CBC WITH DIFFERENTIAL/PLATELET - Abnormal; Notable for the following components:   RBC 3.35 (*)    Hemoglobin 11.4 (*)    HCT 34.6 (*)    MCV 103.3 (*)    Platelets 93 (*)    nRBC 0.3 (*)    All other components within normal limits  URINALYSIS, COMPLETE (UACMP) WITH MICROSCOPIC - Abnormal; Notable for the following components:   pH >9.0 (*)    Hgb urine dipstick TRACE (*)    Protein, ur 30 (*)    All other components within normal limits  RESP PANEL BY RT-PCR (FLU A&B, COVID) ARPGX2  CULTURE, BLOOD (ROUTINE X 2)  CULTURE, BLOOD (ROUTINE X 2)  URINE CULTURE  LACTIC ACID, PLASMA  PROTIME-INR  APTT  PROCALCITONIN  VALPROIC ACID LEVEL  TROPONIN I (HIGH SENSITIVITY)  TROPONIN I  (HIGH SENSITIVITY)   ____________________________________________  EKG  ED ECG REPORT I, Chesley Noon, the attending physician, personally viewed and interpreted this ECG.   Date: 07/22/2021  EKG Time: 8:44  Rate: 100  Rhythm: atrial fibrillation  Axis: Normal  Intervals:none  ST&T Change: None   PROCEDURES  Procedure(s) performed (including Critical Care):  .Critical Care  Date/Time: 07/22/2021 8:51 AM Performed by: Chesley Noon, MD Authorized by: Chesley Noon, MD   Critical care provider statement:    Critical care time (minutes):  45   Critical care time was exclusive of:  Separately billable procedures and treating other patients and teaching time   Critical care was necessary to treat or prevent imminent or life-threatening deterioration of the following conditions:  Sepsis   Critical care was time spent personally by me on the following activities:  Discussions with consultants, evaluation of patient's response to treatment, examination of patient, ordering and performing treatments and interventions, ordering and review of laboratory studies, ordering and review of radiographic studies, pulse oximetry, re-evaluation of patient's condition, obtaining history from patient or surrogate and review of old charts   I assumed direction of critical care for this patient from another provider in my specialty: no     Care discussed with: admitting provider     ____________________________________________   INITIAL IMPRESSION / ASSESSMENT AND PLAN / ED COURSE      65 year old male with past medical history of hypertension, hyperlipidemia, diabetes, CAD, atrial fibrillation on Eliquis, stroke, schizophrenia, dementia who presents to the ED after noted to be shivering and less responsive than usual by staff at his nursing facility.  Patient is awake and alert, but with slurred unintelligible speech and is unable to provide any additional history.  Patient noted to be  febrile, tachycardic, and mildly tachypneic which is consistent with sepsis.  We will start IV fluid resuscitation along with broad-spectrum antibiotics, no obvious source at this time.  He is hemodynamically stable and not in any respiratory distress, protecting his airway at this time.  No focal neurologic deficits noted and I have lower suspicion for stroke.  Labs are unremarkable, chest x-ray reviewed by me and shows no infiltrate, edema, or effusion.  UA does not appear consistent with infection.  Given lack of obvious source of infectious process, CT head and abdomen/pelvis were performed.  These were also negative for acute process and source of sepsis remains unclear.  Testing for COVID-19 and influenza is negative.  No meningismus noted on exam and I have low suspicion for meningitis, especially given his negative procalcitonin.  Case discussed with hospitalist for admission.      ____________________________________________   FINAL CLINICAL IMPRESSION(S) / ED DIAGNOSES  Final diagnoses:  Sepsis without acute organ dysfunction, due to unspecified organism [facility])     ED Discharge Orders     None        Note:  This document was prepared using Dragon voice recognition software and may include unintentional dictation errors.    Chesley Noon, MD 07/22/21 1252

## 2021-07-22 NOTE — ED Notes (Signed)
Informed RN bed assigned 

## 2021-07-22 NOTE — ED Notes (Signed)
IV team in room  

## 2021-07-22 NOTE — Progress Notes (Signed)
A STAT consult was placed to the IV Therapist for more iv access; pt has one IV that was placed by EMS;  both arms assessed w ultrasound but no suitable veins noted;  pt holds his arms tightly;  attempted x 2 in right hand with help of lab tech holding;  very poor access.  RN was at bedside during attempts to place 2nd site.

## 2021-07-22 NOTE — Progress Notes (Signed)
Pharmacy Antibiotic Note  Brian Dodson is a 66 y.o. male admitted on 07/22/2021 with sepsis.  Pharmacy has been consulted for vancomycin and cefepime dosing.  Plan:  1) start cefepime 2 grams IV every 8 hours  2) start vancomycin 750 mg IV Q 12 hrs Goal AUC 400-550 Expected AUC: 459.7 SCr used: 1.1 mg/dL Ke: 2.992 h-1, E2/6: 83.4 h Daily renal function assessment while on IV vancomycin   Height: 6' (182.9 cm) Weight: 99.8 kg (220 lb) IBW/kg (Calculated) : 77.6  Temp (24hrs), Avg:103.1 F (39.5 C), Min:103 F (39.4 C), Max:103.2 F (39.6 C)  Recent Labs  Lab 07/22/21 0849 07/22/21 1201  WBC 6.5  --   CREATININE 1.10  --   LATICACIDVEN 2.0* 1.3    Estimated Creatinine Clearance: 81.9 mL/min (by C-G formula based on SCr of 1.1 mg/dL).    Allergies  Allergen Reactions   Xarelto [Rivaroxaban]     Antimicrobials this admission: vancomycin 09/03 >>  cefepime 09/03 >> metronidazole 09/03 >>   Microbiology results: 09/03 BCx: pending 09/03 UCx: pending 09/03 SARS CoV-2: negative 09/03 influenza A/B: negative   Thank you for allowing pharmacy to be a part of this patient's care.  Lowella Bandy 07/22/2021 1:17 PM

## 2021-07-22 NOTE — ED Notes (Signed)
Per Dr Larinda Buttery, wait to draw 2nd lactic due to delay of IV access, draw after IV fluid bolus complete.

## 2021-07-22 NOTE — Progress Notes (Signed)
CODE SEPSIS - PHARMACY COMMUNICATION  **Broad Spectrum Antibiotics should be administered within 1 hour of Sepsis diagnosis**  Time Code Sepsis Called/Page Received: 0233  Antibiotics Ordered: cefepime and vancomycin  Time of 1st antibiotic administration: 1008  Additional action taken by pharmacy: none, patient required IV team intervention  If necessary, Name of Provider/Nurse Contacted: N/A    Lowella Bandy ,PharmD Clinical Pharmacist  07/22/2021  8:52 AM

## 2021-07-22 NOTE — Progress Notes (Signed)
PHARMACY -  BRIEF ANTIBIOTIC NOTE   Pharmacy has received consult(s) for vancomycin and cefepime from an ED provider.  The patient's profile has been reviewed for ht/wt/allergies/indication/available labs.    One time order(s) placed for:  1) cefepime 2 grams IV x 1  2) vancomycin 2000 mg IV x 1  Further antibiotics/pharmacy consults should be ordered by admitting physician if indicated.                       Thank you, Lowella Bandy 07/22/2021  8:49 AM

## 2021-07-23 ENCOUNTER — Inpatient Hospital Stay
Admit: 2021-07-23 | Discharge: 2021-07-23 | Disposition: A | Discharge status: Home / Self Care | Payer: Medicare Other | Attending: Family Medicine | Admitting: Family Medicine

## 2021-07-23 LAB — CBC
HCT: 38.2 % — ABNORMAL LOW (ref 39.0–52.0)
Hemoglobin: 12.5 g/dL — ABNORMAL LOW (ref 13.0–17.0)
MCH: 33.2 pg (ref 26.0–34.0)
MCHC: 32.7 g/dL (ref 30.0–36.0)
MCV: 101.6 fL — ABNORMAL HIGH (ref 80.0–100.0)
Platelets: 87 10*3/uL — ABNORMAL LOW (ref 150–400)
RBC: 3.76 MIL/uL — ABNORMAL LOW (ref 4.22–5.81)
RDW: 14.6 % (ref 11.5–15.5)
WBC: 5.6 10*3/uL (ref 4.0–10.5)
nRBC: 0 % (ref 0.0–0.2)

## 2021-07-23 LAB — URINE CULTURE: Culture: NO GROWTH

## 2021-07-23 LAB — BASIC METABOLIC PANEL
Anion gap: 5 (ref 5–15)
BUN: 18 mg/dL (ref 8–23)
CO2: 27 mmol/L (ref 22–32)
Calcium: 8.9 mg/dL (ref 8.9–10.3)
Chloride: 105 mmol/L (ref 98–111)
Creatinine, Ser: 1.06 mg/dL (ref 0.61–1.24)
GFR, Estimated: >60 mL/min (ref >60)
Glucose, Bld: 89 mg/dL (ref 70–99)
Potassium: 4.4 mmol/L (ref 3.5–5.1)
Sodium: 137 mmol/L (ref 135–145)

## 2021-07-23 LAB — PROTIME-INR
INR: 1.2 (ref 0.8–1.2)
Prothrombin Time: 14.7 seconds (ref 11.4–15.2)

## 2021-07-23 LAB — CORTISOL-AM, BLOOD: Cortisol - AM: 14.7 ug/dL (ref 6.7–22.6)

## 2021-07-23 LAB — PROCALCITONIN: Procalcitonin: <0.1 ng/mL

## 2021-07-23 LAB — GLUCOSE, CAPILLARY
Glucose-Capillary: 90 mg/dL (ref 70–99)
Glucose-Capillary: 136 mg/dL — ABNORMAL HIGH (ref 70–99)
Glucose-Capillary: 91 mg/dL (ref 70–99)
Glucose-Capillary: 114 mg/dL — ABNORMAL HIGH (ref 70–99)

## 2021-07-23 MED ORDER — SODIUM CHLORIDE 0.9 % IV SOLN
2.0000 g | INTRAVENOUS | Status: DC
  Administered 2021-07-23 – 2021-07-26 (×4): 2 g via INTRAVENOUS
  Filled 2021-07-23: qty 20
  Filled 2021-07-23: qty 2
  Filled 2021-07-23 (×2): qty 20

## 2021-07-23 NOTE — Progress Notes (Signed)
PHARMACY - PHYSICIAN COMMUNICATION CRITICAL VALUE ALERT - BLOOD CULTURE IDENTIFICATION (BCID)  Brian Dodson is an 65 y.o. male who presented to [facility] on 07/22/2021 with a chief complaint of Sepsis   Assessment:  Strep species in 3 of 4 bottles, no resistance (include suspected source if known)  Name of physician (or Provider) Contacted: T Opyd   Current antibiotics:   Vanc, cefepime , metronidazole   Changes to prescribed antibiotics recommended:  Ceftriaxone 2 gm IV Q24H ordered,  will d/c vanc, cefepime, metronidazole.   Results for orders placed or performed during the hospital encounter of 07/22/21  Blood Culture ID Panel (Reflexed) (Collected: 07/22/2021 10:03 AM)  Result Value Ref Range   Enterococcus faecalis NOT DETECTED NOT DETECTED   Enterococcus Faecium NOT DETECTED NOT DETECTED   Listeria monocytogenes NOT DETECTED NOT DETECTED   Staphylococcus species NOT DETECTED NOT DETECTED   Staphylococcus aureus (BCID) NOT DETECTED NOT DETECTED   Staphylococcus epidermidis NOT DETECTED NOT DETECTED   Staphylococcus lugdunensis NOT DETECTED NOT DETECTED   Streptococcus species DETECTED (A) NOT DETECTED   Streptococcus agalactiae NOT DETECTED NOT DETECTED   Streptococcus pneumoniae NOT DETECTED NOT DETECTED   Streptococcus pyogenes NOT DETECTED NOT DETECTED   A.calcoaceticus-baumannii NOT DETECTED NOT DETECTED   Bacteroides fragilis NOT DETECTED NOT DETECTED   Enterobacterales NOT DETECTED NOT DETECTED   Enterobacter cloacae complex NOT DETECTED NOT DETECTED   Escherichia coli NOT DETECTED NOT DETECTED   Klebsiella aerogenes NOT DETECTED NOT DETECTED   Klebsiella oxytoca NOT DETECTED NOT DETECTED   Klebsiella pneumoniae NOT DETECTED NOT DETECTED   Proteus species NOT DETECTED NOT DETECTED   Salmonella species NOT DETECTED NOT DETECTED   Serratia marcescens NOT DETECTED NOT DETECTED   Haemophilus influenzae NOT DETECTED NOT DETECTED   Neisseria meningitidis NOT DETECTED NOT  DETECTED   Pseudomonas aeruginosa NOT DETECTED NOT DETECTED   Stenotrophomonas maltophilia NOT DETECTED NOT DETECTED   Candida albicans NOT DETECTED NOT DETECTED   Candida auris NOT DETECTED NOT DETECTED   Candida glabrata NOT DETECTED NOT DETECTED   Candida krusei NOT DETECTED NOT DETECTED   Candida parapsilosis NOT DETECTED NOT DETECTED   Candida tropicalis NOT DETECTED NOT DETECTED   Cryptococcus neoformans/gattii NOT DETECTED NOT DETECTED    Zyliah Schier D 07/23/2021  12:17 AM

## 2021-07-23 NOTE — Plan of Care (Signed)
Patient ID: Brian Dodson, male   DOB: 05-07-1956, 65 y.o.   MRN: [id number]  Problem: Education: Goal: Knowledge of General Education information will improve Description: Including pain rating scale, medication(s)/side effects and non-pharmacologic comfort measures Outcome: Progressing   Problem: Health Behavior/Discharge Planning: Goal: Ability to manage health-related needs will improve Outcome: Progressing   Problem: Clinical Measurements: Goal: Ability to maintain clinical measurements within normal limits will improve Outcome: Progressing Goal: Will remain free from infection Outcome: Progressing Goal: Diagnostic test results will improve Outcome: Progressing Goal: Respiratory complications will improve Outcome: Progressing Goal: Cardiovascular complication will be avoided Outcome: Progressing   Problem: Activity: Goal: Risk for activity intolerance will decrease Outcome: Progressing   Problem: Nutrition: Goal: Adequate nutrition will be maintained Outcome: Progressing   Problem: Coping: Goal: Level of anxiety will decrease Outcome: Progressing   Problem: Elimination: Goal: Will not experience complications related to bowel motility Outcome: Progressing Goal: Will not experience complications related to urinary retention Outcome: Progressing   Problem: Pain Managment: Goal: General experience of comfort will improve Outcome: Progressing   Problem: Safety: Goal: Ability to remain free from injury will improve Outcome: Progressing   Problem: Skin Integrity: Goal: Risk for impaired skin integrity will decrease Outcome: Progressing  Lidia Collum, RN

## 2021-07-23 NOTE — Progress Notes (Signed)
*  PRELIMINARY RESULTS* Echocardiogram 2D Echocardiogram has been performed.  Brian Dodson 07/23/2021, 11:47 AM

## 2021-07-23 NOTE — Progress Notes (Signed)
During incontinence care, this RN noted redness and mild swelling to patient's right groin. Slightly tender to palpation. Unable to appreciate any open skin in areas and patient unable to provide any details about the site. Will pass to day RN to mention to MD since infectious source unknown at this time.

## 2021-07-23 NOTE — Progress Notes (Signed)
ID Full ID consult to follow Pt with streptococcus bacteremia Pending final ID Source unclear . UA no wbc, but CT abdomen shows small amount of perinephric fluid b/l Will need 2 d echo ( may need TEE) Repeat blood culture Continue ceftriaxone

## 2021-07-23 NOTE — Progress Notes (Signed)
[facility] PROGRESS NOTE    Brian Dodson  ZOX:[id number] DOB: [birth year] DOA: 07/22/2021 PCP: Galvin Proffer, MD      Brief Narrative:  Mr. Ontiveros is a 65 y.o. M with schizophrenia, living long-term in a nursing home, CAD, A. fib, hypertension, type 2 diabetes, dementia, who presents with fever from his nursing home.  Staff at [facility] found patient with shaking chills, and less responsive than baseline.  In the ER, CXR clear, UA unremarkable.  Started on empiric antibioics and cultures drawn.  Overnight blood cultures growing staph lugdunensis in 3/4 bottles           Assessment & Plan:  Severe sepsis due to staph lugdunensis bacteremia -Continue ROcephin -Continue IV fluids -Obtain echo - Consult ID   Acute metabolic encephalopathy   Thrombcytopenia Mild stable, due to sepsis.  A. Fib -continue apixaban  CAD -continue aspirin, atorvastatin, carvedilol  Depression Schizophrenia -continue benztropine, risperidone -continue citalopram  Hypertension -continue losartan  DM2 - sensitive insulin sliding scale as well  Dementia -continue Depakote, donepezil          Disposition: Status is: Inpatient  Remains inpatient appropriate because:Ongoing diagnostic testing needed not appropriate for outpatient work up  Dispo: The patient is from: Home              Anticipated d/c is to: Home              Patient currently is not medically stable to d/c.   Difficult to place patient No       Level of care: Med-Surg       MDM: The below labs and imaging reports were reviewed and summarized above.  Medication management as above.    DVT prophylaxis:  apixaban (ELIQUIS) tablet 5 mg  Code Status: FULL Family Communication:            Subjective: No further fever.  No headache, chest pain, joint pains, confusion  Objective: Vitals:   07/22/21 2334 07/23/21 0535 07/23/21 0744 07/23/21 1100  BP: 117/79 128/84 (!)  138/96 126/87  Pulse: (!) 55 (!) 52 61 (!) 58  Resp: Temp: 97.6 F (36.4 C)  98.4 F (36.9 C) 98.4 F (36.9 C)  TempSrc: Oral   Oral  SpO2: 100% 97% 100% 100%  Weight:      Height:        Intake/Output Summary (Last 24 hours) at 07/23/2021 1157 Last data filed at 07/23/2021 1038 Gross per 24 hour  Intake 4695.28 ml  Output 1250 ml  Net 3445.28 ml   Filed Weights   07/22/21 0844  Weight: 99.8 kg    Examination: General appearance:  adult male, alert and in no distress.   HEENT: Anicteric, conjunctiva pink, lids and lashes normal. No nasal deformity, discharge, epistaxis.  Lips moist, edentulous, oropharynx moist, no oral lesions, hearing normal.   Skin: Warm and dry.  no jaundice.  No suspicious rashes or lesions. Cardiac: RRR, nl S1-S2, no murmurs appreciated.  Capillary refill is brisk.   Respiratory: Normal respiratory rate and rhythm.  CTAB without rales or wheezes. Abdomen: Abdomen soft.  No TTP or guarding. No ascites, distension, hepatosplenomegaly.   MSK: No deformities or effusions. Neuro: Awake and alert.  EOMI, moves all extremities. Speech fluent.    Psych: Sensorium intact and responding to questions, attention normal. Affect pleasant.  Judgment and insight appear severely impaired.    Data Reviewed: I have personally reviewed following labs and  imaging studies:  CBC: Recent Labs  Lab 07/22/21 0849 07/23/21 0550  WBC 6.5 5.6  NEUTROABS 5.0  --   HGB 11.4* 12.5*  HCT 34.6* 38.2*  MCV 103.3* 101.6*  PLT 93* 87*   Basic Metabolic Panel: Recent Labs  Lab 07/22/21 0849 07/23/21 0550  NA 139 137  K 4.5 4.4  CL 103 105  CO2 31 27  GLUCOSE 96 89  BUN 17 18  CREATININE 1.10 1.06  CALCIUM 8.7* 8.9   GFR: Estimated Creatinine Clearance: 85 mL/min (by C-G formula based on SCr of 1.06 mg/dL). Liver Function Tests: Recent Labs  Lab 07/22/21 0849  AST 23  ALT 14  ALKPHOS 54  BILITOT 0.6  PROT 6.9  ALBUMIN 3.5   No results for  input(s): LIPASE, AMYLASE in the last 168 hours. No results for input(s): AMMONIA in the last 168 hours. Coagulation Profile: Recent Labs  Lab 07/22/21 0849 07/23/21 0550  INR 1.0 1.2   Cardiac Enzymes: No results for input(s): CKTOTAL, CKMB, CKMBINDEX, TROPONINI in the last 168 hours. BNP (last 3 results) No results for input(s): PROBNP in the last 8760 hours. HbA1C: No results for input(s): HGBA1C in the last 72 hours. CBG: Recent Labs  Lab 07/22/21 1917 07/23/21 0800  GLUCAP 80 90   Lipid Profile: No results for input(s): CHOL, HDL, LDLCALC, TRIG, CHOLHDL, LDLDIRECT in the last 72 hours. Thyroid Function Tests: No results for input(s): TSH, T4TOTAL, FREET4, T3FREE, THYROIDAB in the last 72 hours. Anemia Panel: No results for input(s): VITAMINB12, FOLATE, FERRITIN, TIBC, IRON, RETICCTPCT in the last 72 hours. Urine analysis:    Component Value Date/Time   COLORURINE YELLOW 07/22/2021 0930   APPEARANCEUR CLEAR 07/22/2021 0930   APPEARANCEUR Clear 03/14/2016 1027   LABSPEC 1.015 07/22/2021 0930   LABSPEC 1.017 01/29/2014 1545   PHURINE >9.0 (H) 07/22/2021 0930   GLUCOSEU NEGATIVE 07/22/2021 0930   GLUCOSEU Negative 01/29/2014 1545   HGBUR TRACE (A) 07/22/2021 0930   BILIRUBINUR NEGATIVE 07/22/2021 0930   BILIRUBINUR Negative 03/14/2016 1027   BILIRUBINUR Negative 01/29/2014 1545   KETONESUR NEGATIVE 07/22/2021 0930   PROTEINUR 30 (A) 07/22/2021 0930   NITRITE NEGATIVE 07/22/2021 0930   LEUKOCYTESUR NEGATIVE 07/22/2021 0930   LEUKOCYTESUR Negative 01/29/2014 1545   Sepsis Labs: (procalcitonin:4,lacticacidven:4)  ) Recent Results (from the past 240 hour(s))  Resp Panel by RT-PCR (Flu A&B, Covid) Nasopharyngeal Swab     Status: None   Collection Time: 07/22/21  8:47 AM   Specimen: Nasopharyngeal Swab; Nasopharyngeal(NP) swabs in vial transport medium  Result Value Ref Range Status   SARS Coronavirus 2 by RT PCR NEGATIVE NEGATIVE Final    Comment:  (NOTE) SARS-CoV-2 target nucleic acids are NOT DETECTED.  The SARS-CoV-2 RNA is generally detectable in upper respiratory specimens during the acute phase of infection. The lowest concentration of SARS-CoV-2 viral copies this assay can detect is 138 copies/mL. A negative result does not preclude SARS-Cov-2 infection and should not be used as the sole basis for treatment or other patient management decisions. A negative result may occur with  improper specimen collection/handling, submission of specimen other than nasopharyngeal swab, presence of viral mutation(s) within the areas targeted by this assay, and inadequate number of viral copies(<138 copies/mL). A negative result must be combined with clinical observations, patient history, and epidemiological information. The expected result is Negative.  Fact Sheet for Patients:  BloggerCourse.com  Fact Sheet for Healthcare Providers:  SeriousBroker.it  This test is no t yet approved or cleared by  the [employer] and  has been authorized for detection and/or diagnosis of SARS-CoV-2 by FDA under an Emergency Use Authorization (EUA). This EUA will remain  in effect (meaning this test can be used) for the duration of the COVID-19 declaration under Section 564(b)(1) of the Act, 21 U.S.C.section 360bbb-3(b)(1), unless the authorization is terminated  or revoked sooner.       Influenza A by PCR NEGATIVE NEGATIVE Final   Influenza B by PCR NEGATIVE NEGATIVE Final    Comment: (NOTE) The Xpert Xpress SARS-CoV-2/FLU/RSV plus assay is intended as an aid in the diagnosis of influenza from Nasopharyngeal swab specimens and should not be used as a sole basis for treatment. Nasal washings and aspirates are unacceptable for Xpert Xpress SARS-CoV-2/FLU/RSV testing.  Fact Sheet for Patients: BloggerCourse.com  Fact Sheet for Healthcare  Providers: SeriousBroker.it  This test is not yet approved or cleared by the [place] FDA and has been authorized for detection and/or diagnosis of SARS-CoV-2 by FDA under an Emergency Use Authorization (EUA). This EUA will remain in effect (meaning this test can be used) for the duration of the COVID-19 declaration under Section 564(b)(1) of the Act, 21 U.S.C. section 360bbb-3(b)(1), unless the authorization is terminated or revoked.  Performed at [facility], [address] [place], [place] [address]   Urine Culture     Status: None   Collection Time: 07/22/21  9:30 AM   Specimen: Urine, Random  Result Value Ref Range Status   Specimen Description   Final    URINE, RANDOM Performed at [facility], [address], [place], [place] [address]    Special Requests   Final    NONE Performed at [facility], [address], [place], [place] [address]    Culture   Final    NO GROWTH Performed at [facility], [address] [place], [place] [address]    Report Status 07/23/2021 FINAL  Final  Blood Culture (routine x 2)     Status: None (Preliminary result)   Collection Time: 07/22/21 10:03 AM   Specimen: BLOOD  Result Value Ref Range Status   Specimen Description   Final    BLOOD BLOOD LEFT HAND Performed at [facility], [address] [place], [place] [address]    Special Requests   Final    BOTTLES DRAWN AEROBIC AND ANAEROBIC Blood Culture adequate volume Performed at [facility], [address], [place], [place] [address]    Culture  Setup Time   Final    GRAM POSITIVE COCCI IN BOTH AEROBIC AND ANAEROBIC BOTTLES CRITICAL RESULT CALLED TO, READ BACK BY AND VERIFIED WITH: JASON ROBBINS @ 2300 ON 07/22/2021 BY CAF    Culture GRAM POSITIVE COCCI  Final   Report Status PENDING  Incomplete  Blood Culture (routine x 2)     Status: None (Preliminary result)   Collection Time: 07/22/21  10:03 AM   Specimen: BLOOD  Result Value Ref Range Status   Specimen Description BLOOD RIGHT ANTECUBITAL  Final   Special Requests   Final    BOTTLES DRAWN AEROBIC ONLY Blood Culture adequate volume   Culture  Setup Time   Final    GRAM POSITIVE COCCI AEROBIC BOTTLE ONLY Organism ID to follow CRITICAL RESULT CALLED TO, READ BACK BY AND VERIFIED WITH: JASON ROBBINS @ 2300 ON 07/22/2021 BY CAF Performed at [facility], [address] [place], [place] [address]    Culture GRAM POSITIVE COCCI  Final   Report Status PENDING  Incomplete  Blood Culture ID Panel (Reflexed)     Status: Abnormal   Collection Time: 07/22/21 10:03 AM  Result Value Ref Range Status   Enterococcus faecalis NOT DETECTED NOT DETECTED Final   Enterococcus Faecium NOT DETECTED NOT DETECTED Final   Listeria monocytogenes NOT DETECTED NOT DETECTED Final   Staphylococcus species NOT DETECTED NOT DETECTED Final   Staphylococcus aureus (BCID) NOT DETECTED NOT DETECTED Final   Staphylococcus epidermidis NOT DETECTED NOT DETECTED Final   Staphylococcus lugdunensis NOT DETECTED NOT DETECTED Final   Streptococcus species DETECTED (A) NOT DETECTED Final    Comment: Not Enterococcus species, Streptococcus agalactiae, Streptococcus pyogenes, or Streptococcus pneumoniae. CRITICAL RESULT CALLED TO, READ BACK BY AND VERIFIED WITH: JASON ROBBINS @ 2300 ON 07/22/2021 BY CAF    Streptococcus agalactiae NOT DETECTED NOT DETECTED Final   Streptococcus pneumoniae NOT DETECTED NOT DETECTED Final   Streptococcus pyogenes NOT DETECTED NOT DETECTED Final   A.calcoaceticus-baumannii NOT DETECTED NOT DETECTED Final   Bacteroides fragilis NOT DETECTED NOT DETECTED Final   Enterobacterales NOT DETECTED NOT DETECTED Final   Enterobacter cloacae complex NOT DETECTED NOT DETECTED Final   Escherichia coli NOT DETECTED NOT DETECTED Final   Klebsiella aerogenes NOT DETECTED NOT DETECTED Final   Klebsiella oxytoca NOT DETECTED NOT  DETECTED Final   Klebsiella pneumoniae NOT DETECTED NOT DETECTED Final   Proteus species NOT DETECTED NOT DETECTED Final   Salmonella species NOT DETECTED NOT DETECTED Final   Serratia marcescens NOT DETECTED NOT DETECTED Final   Haemophilus influenzae NOT DETECTED NOT DETECTED Final   Neisseria meningitidis NOT DETECTED NOT DETECTED Final   Pseudomonas aeruginosa NOT DETECTED NOT DETECTED Final   Stenotrophomonas maltophilia NOT DETECTED NOT DETECTED Final   Candida albicans NOT DETECTED NOT DETECTED Final   Candida auris NOT DETECTED NOT DETECTED Final   Candida glabrata NOT DETECTED NOT DETECTED Final   Candida krusei NOT DETECTED NOT DETECTED Final   Candida parapsilosis NOT DETECTED NOT DETECTED Final   Candida tropicalis NOT DETECTED NOT DETECTED Final   Cryptococcus neoformans/gattii NOT DETECTED NOT DETECTED Final    Comment: Performed at [facility], [address] [place], [place] [address]  MRSA Next Gen by PCR, Nasal     Status: None   Collection Time: 07/22/21  7:25 PM   Specimen: Nasal Mucosa; Nasal Swab  Result Value Ref Range Status   MRSA by PCR Next Gen NOT DETECTED NOT DETECTED Final    Comment: (NOTE) The GeneXpert MRSA Assay (FDA approved for NASAL specimens only), is one component of a comprehensive MRSA colonization surveillance program. It is not intended to diagnose MRSA infection nor to guide or monitor treatment for MRSA infections. Test performance is not FDA approved in patients less than 66 years old. Performed at [facility], [address] [place], [place] [address]          Radiology Studies: CT Head Wo Contrast  Result Date: 07/22/2021 CLINICAL DATA:  Mental status change, atrial fibrillation, dementia EXAM: CT HEAD WITHOUT CONTRAST TECHNIQUE: Contiguous axial images were obtained from the base of the skull through the vertex without intravenous contrast. COMPARISON:  03/02/2021 FINDINGS: Brain: Atrophy and white matter  microvascular ischemic changes throughout both cerebral hemispheres. No acute intracranial hemorrhage, new mass lesion, definite infarction, midline shift, herniation, hydrocephalus, or extra-axial fluid collection. No focal mass effect or edema. Cisterns are patent. Cerebellar atrophy as well. Vascular: No hyperdense vessel or unexpected calcification. Skull: Normal.  Negative for fracture or focal lesion. Sinuses/Orbits: No acute finding. Other: None. IMPRESSION: Chronic atrophy and white matter microvascular ischemic changes. No acute intracranial abnormality by noncontrast CT. Electronically Signed   By: Judie Petit.  Shick M.D.   On: 07/22/2021 12:09   CT Abdomen Pelvis W Contrast  Result Date: 07/22/2021 CLINICAL DATA:  Evaluate for abdominal abscess or infection. EXAM: CT ABDOMEN AND PELVIS WITH CONTRAST TECHNIQUE: Multidetector CT imaging of the abdomen and pelvis was performed using the standard protocol following bolus administration of intravenous contrast. CONTRAST:  36mL OMNIPAQUE IOHEXOL 350 MG/ML SOLN COMPARISON:  CT abdomen pelvis 11/21/2005. FINDINGS: Lower chest: Normal heart size. Dependent atelectasis within the bilateral lower lobes. Hepatobiliary: Streak artifact limits evaluation. Liver is normal in size and contour. Gallbladder is unremarkable. Pancreas: Unremarkable Spleen: Unremarkable Adrenals/Urinary Tract: Normal adrenal glands. Kidneys enhance symmetrically with contrast. Small amount of perinephric fluid bilaterally. No hydronephrosis. Urinary bladder is unremarkable. Stomach/Bowel: Stool throughout the colon. No abnormal bowel wall thickening or evidence for bowel obstruction. No free fluid or free intraperitoneal air. Normal appendix. Normal morphology of the stomach. Vascular/Lymphatic: Normal caliber abdominal aorta. No retroperitoneal lymphadenopathy. Reproductive: Heterogeneous prostate. Other: None. Musculoskeletal: Lower thoracic and lumbar spine degenerative changes. No aggressive or  acute appearing osseous lesions. IMPRESSION: Limited exam due to streak artifact. Small amount of perinephric fluid bilaterally. Recommend correlation with urinalysis to exclude urinary tract infection. Otherwise no acute process within the abdomen or pelvis. Electronically Signed   By: Annia Belt M.D.   On: 07/22/2021 12:13   US RENAL  Result Date: 07/22/2021 CLINICAL DATA:  Abnormal urologic system. EXAM: RENAL / URINARY TRACT ULTRASOUND COMPLETE COMPARISON:  CT abdomen pelvis dated 07/22/2021 and renal ultrasound dated 01/30/2017. FINDINGS: Right Kidney: Renal measurements: 11.6 x 6.1 x 5.4 cm = volume: 200 mL. Echogenicity is increased. No mass or hydronephrosis visualized. Left Kidney: Renal measurements: 10.1 x 6.1 x 5.8 cm = volume: 187 mL. Echogenicity is increased. No mass or hydronephrosis visualized. Bladder: Appears normal for degree of bladder distention. Other: The exam is limited due to patient body habitus. IMPRESSION: No hydronephrosis. Increased renal cortical echogenicity is consistent with chronic medical renal disease. Electronically Signed   By: Romona Curls M.D.   On: 07/22/2021 15:23   DG Chest Port 1 View  Result Date: 07/22/2021 CLINICAL DATA:  Altered mental status, unresponsive, fever, sepsis evaluation EXAM: PORTABLE CHEST 1 VIEW COMPARISON:  03/02/2021 FINDINGS: Low lung volumes with vascular prominence and basilar atelectasis. Heart is enlarged. No focal pneumonia, acute airspace process, effusion, or edema. No pneumothorax. Trachea midline. Degenerative changes of the spine. IMPRESSION: Low volume exam with cardiomegaly and basilar atelectasis. Electronically Signed   By: Judie Petit.  Shick M.D.   On: 07/22/2021 10:35        Scheduled Meds:  apixaban  5 mg Oral BID   aspirin EC  81 mg Oral Daily   atorvastatin  10 mg Oral Daily   benztropine  1 mg Oral QHS   brimonidine  1 drop Both Eyes BID   And   timolol  1 drop Both Eyes BID   carvedilol  6.25 mg Oral BID WC    citalopram  20 mg Oral Daily   divalproex  2,000 mg Oral QHS   donepezil  10 mg Oral QHS   insulin aspart  0-9 Units Subcutaneous TID WC   latanoprost  1 drop Both Eyes QHS   losartan  25 mg Oral Daily   pantoprazole  40 mg Oral Daily  risperidone  4 mg Oral QHS   sodium chloride flush  3 mL Intravenous Q12H   Continuous Infusions:  cefTRIAXone (ROCEPHIN)  IV Stopped (07/23/21 0117)   lactated ringers 100 mL/hr at 07/23/21 1104     LOS: 1 day    Time spent: 25 minuets    Alberteen Sam, MD [facility] 07/23/2021, 11:57 AM     Please page though AMION or Epic secure chat:  For [employer], Higher education careers adviser

## 2021-07-24 DIAGNOSIS — A419 Sepsis, unspecified organism: Secondary | ICD-10-CM

## 2021-07-24 DIAGNOSIS — R7881 Bacteremia: Secondary | ICD-10-CM | POA: Diagnosis not present

## 2021-07-24 DIAGNOSIS — B955 Unspecified streptococcus as the cause of diseases classified elsewhere: Secondary | ICD-10-CM

## 2021-07-24 LAB — GLUCOSE, CAPILLARY
Glucose-Capillary: 86 mg/dL (ref 70–99)
Glucose-Capillary: 105 mg/dL — ABNORMAL HIGH (ref 70–99)
Glucose-Capillary: 83 mg/dL (ref 70–99)
Glucose-Capillary: 137 mg/dL — ABNORMAL HIGH (ref 70–99)

## 2021-07-24 LAB — ECHOCARDIOGRAM COMPLETE
AR max vel: 3.7 cm2
AV Peak grad: 3.5 mmHg
Ao pk vel: 0.94 m/s
Area-P 1/2: 2.76 cm2
Height: 72 in
S' Lateral: 3.1 cm
Weight: 3520 oz

## 2021-07-24 LAB — BASIC METABOLIC PANEL
Anion gap: 5 (ref 5–15)
BUN: 18 mg/dL (ref 8–23)
CO2: 30 mmol/L (ref 22–32)
Calcium: 8.8 mg/dL — ABNORMAL LOW (ref 8.9–10.3)
Chloride: 103 mmol/L (ref 98–111)
Creatinine, Ser: 0.98 mg/dL (ref 0.61–1.24)
GFR, Estimated: >60 mL/min (ref >60)
Glucose, Bld: 91 mg/dL (ref 70–99)
Potassium: 4.5 mmol/L (ref 3.5–5.1)
Sodium: 138 mmol/L (ref 135–145)

## 2021-07-24 LAB — CBC
HCT: 33.5 % — ABNORMAL LOW (ref 39.0–52.0)
Hemoglobin: 10.9 g/dL — ABNORMAL LOW (ref 13.0–17.0)
MCH: 33.1 pg (ref 26.0–34.0)
MCHC: 32.5 g/dL (ref 30.0–36.0)
MCV: 101.8 fL — ABNORMAL HIGH (ref 80.0–100.0)
Platelets: 83 10*3/uL — ABNORMAL LOW (ref 150–400)
RBC: 3.29 MIL/uL — ABNORMAL LOW (ref 4.22–5.81)
RDW: 14.4 % (ref 11.5–15.5)
WBC: 6.4 10*3/uL (ref 4.0–10.5)
nRBC: 0 % (ref 0.0–0.2)

## 2021-07-24 LAB — PROCALCITONIN: Procalcitonin: <0.1 ng/mL

## 2021-07-24 NOTE — Progress Notes (Signed)
[facility] PROGRESS NOTE    Brian Dodson  AVW:[id number] DOB: [birth year] DOA: 07/22/2021 PCP: Galvin Proffer, MD      Brief Narrative:  Brian Dodson is a 65 y.o. M with schizophrenia, dementia living long-term in a nursing home, CAD, A. fib, hypertension, type 2 diabetes who presented with fever from his nursing home.  Staff at [facility] found patient with shaking chills, and less responsive than baseline.  In the ER, CXR clear, UA unremarkable.  Started on empiric antibioics and cultures drawn.  Overnight blood cultures growing staph lugdunensis in 3/4 bottles           Assessment & Plan:  Severe sepsis due to streptococcal bacteremia Patient appeared to have severe sepsis (febrile, heart rate 100s, thrombocytopenia, decreased mentation) at admission, started on Vanco, cefepime, and Flagyl.    Cultures grew Streptococcus species (not Enterococcus, pyogenes, or group B strep), transition to Rocephin, echo normal, ID consulted.  Feels asymptomatic, no fever. -Continue Rocephin - Follow repeat cultures, obtain 9/4 - Plan for TEE - Consult ID, appreciate recommendations    Acute metabolic encephalopathy superimposed on dementia  Patient is "chipper" and active.  On admission he was somnolent and confused.  He is now back to his baseline. - Continue donepezil  Thrombcytopenia Mild, stable, due to sepsis  Paroxysmal atrial fibrillation Rate controlled - Continue apixaban, carvedilol  Coronary disease, secondary prevention Hypertension Blood pressure normal - Continue aspirin, atorvastatin, carvedilol, losartan  Depression Schizophrenia -Continue citalopram, benztropine, risperidone, Depakote   Diabetes glucose controlled - Continue sliding scale corrections           Disposition: Status is: Inpatient  Remains inpatient appropriate because:Ongoing diagnostic testing needed not appropriate for outpatient work up  Dispo: The patient is  from: Home              Anticipated d/c is to: Home              Patient currently is not medically stable to d/c.   Difficult to place patient No       Level of care: Med-Surg       MDM: The below labs and imaging reports were reviewed and summarized above.  Medication management as above.    DVT prophylaxis:  apixaban (ELIQUIS) tablet 5 mg  Code Status: FULL Family Communication:            Subjective: Patient has no headache, chest pain, dyspnea, abdominal pain, fever, confusion.  Objective: Vitals:   07/24/21 0100 07/24/21 0538 07/24/21 0539 07/24/21 0801  BP: (!) 148/82 136/88  (!) 140/97  Pulse: 62 (!) 48 60 72  Resp: Temp: 98.6 F (37 C) 98.2 F (36.8 C)  98.7 F (37.1 C)  TempSrc: Oral     SpO2: 97% 100%  95%  Weight:      Height:        Intake/Output Summary (Last 24 hours) at 07/24/2021 0919 Last data filed at 07/24/2021 0801 Gross per 24 hour  Intake 2847.77 ml  Output 2900 ml  Net -52.23 ml   Filed Weights   07/22/21 0844  Weight: 99.8 kg    Examination: General appearance: Appears well, sitting up in bed, conversational     HEENT: Anicteric, conjunctive are pink, lids and lashes normal.  No nasal deformity, discharge, or epistaxis Skin: Oropharynx moist, no oral lesions, hearing normal Cardiac: Irregularly irregular, normal rate, no murmurs, no lower extremity edema Respiratory: Normal respiratory rate and  rhythm, lungs clear without rales or wheezes Abdomen: Abdomen soft without tenderness palpation or guarding, no ascites or distention MSK: No deformities or joint pains, full range of motion all joints. Neuro: Awake and alert, extraocular movements intact, moves upper extremities with generalized weakness, but Symmetric strength, speech fluent Psych: Attention normal, affect pleasant, judgment and insight appear impaired     Data Reviewed: I have personally reviewed following labs and imaging studies:  CBC: Recent  Labs  Lab 07/28/2021 0849 07/23/21 0550 07/24/21 0537  WBC 6.5 5.6 6.4  NEUTROABS 5.0  --   --   HGB 11.4* 12.5* 10.9*  HCT 34.6* 38.2* 33.5*  MCV 103.3* 101.6* 101.8*  PLT 93* 87* 83*   Basic Metabolic Panel: Recent Labs  Lab 07/28/21 0849 07/23/21 0550 07/24/21 0537  NA 139 137 138  K 4.5 4.4 4.5  CL 103 105 103  CO2 GLUCOSE 96 89 91  BUN CREATININE 1.10 1.06 0.98  CALCIUM 8.7* 8.9 8.8*   GFR: Estimated Creatinine Clearance: 91.9 mL/min (by C-G formula based on SCr of 0.98 mg/dL). Liver Function Tests: Recent Labs  Lab Jul 28, 2021 0849  AST 23  ALT 14  ALKPHOS 54  BILITOT 0.6  PROT 6.9  ALBUMIN 3.5   No results for input(s): LIPASE, AMYLASE in the last 168 hours. No results for input(s): AMMONIA in the last 168 hours. Coagulation Profile: Recent Labs  Lab 2021/07/28 0849 07/23/21 0550  INR 1.0 1.2   Cardiac Enzymes: No results for input(s): CKTOTAL, CKMB, CKMBINDEX, TROPONINI in the last 168 hours. BNP (last 3 results) No results for input(s): PROBNP in the last 8760 hours. HbA1C: No results for input(s): HGBA1C in the last 72 hours. CBG: Recent Labs  Lab 07/23/21 0800 07/23/21 1207 07/23/21 1605 07/23/21 2117 07/24/21 0751  GLUCAP 90 136* 91 114* 86   Lipid Profile: No results for input(s): CHOL, HDL, LDLCALC, TRIG, CHOLHDL, LDLDIRECT in the last 72 hours. Thyroid Function Tests: No results for input(s): TSH, T4TOTAL, FREET4, T3FREE, THYROIDAB in the last 72 hours. Anemia Panel: No results for input(s): VITAMINB12, FOLATE, FERRITIN, TIBC, IRON, RETICCTPCT in the last 72 hours. Urine analysis:    Component Value Date/Time   COLORURINE YELLOW 07/28/21 0930   APPEARANCEUR CLEAR 07/28/21 0930   APPEARANCEUR Clear 03/14/2016 1027   LABSPEC 1.015 07/28/2021 0930   LABSPEC 1.017 01/29/2014 1545   PHURINE >9.0 (H) 07/28/21 0930   GLUCOSEU NEGATIVE 07/28/2021 0930   GLUCOSEU Negative 01/29/2014 1545   HGBUR TRACE (A)  07/28/21 0930   BILIRUBINUR NEGATIVE July 28, 2021 0930   BILIRUBINUR Negative 03/14/2016 1027   BILIRUBINUR Negative 01/29/2014 1545   KETONESUR NEGATIVE 07-28-2021 0930   PROTEINUR 30 (A) July 28, 2021 0930   NITRITE NEGATIVE 07-28-21 0930   LEUKOCYTESUR NEGATIVE 07-28-2021 0930   LEUKOCYTESUR Negative 01/29/2014 1545   Sepsis Labs: (procalcitonin:4,lacticacidven:4)  ) Recent Results (from the past 240 hour(s))  Resp Panel by RT-PCR (Flu A&B, Covid) Nasopharyngeal Swab     Status: None   Collection Time: 2021/07/28  8:47 AM   Specimen: Nasopharyngeal Swab; Nasopharyngeal(NP) swabs in vial transport medium  Result Value Ref Range Status   SARS Coronavirus 2 by RT PCR NEGATIVE NEGATIVE Final    Comment: (NOTE) SARS-CoV-2 target nucleic acids are NOT DETECTED.  The SARS-CoV-2 RNA is generally detectable in upper respiratory specimens during the acute phase of infection. The lowest concentration of SARS-CoV-2 viral copies this assay can detect is 138 copies/mL. A negative result  does not preclude SARS-Cov-2 infection and should not be used as the sole basis for treatment or other patient management decisions. A negative result may occur with  improper specimen collection/handling, submission of specimen other than nasopharyngeal swab, presence of viral mutation(s) within the areas targeted by this assay, and inadequate number of viral copies(<138 copies/mL). A negative result must be combined with clinical observations, patient history, and epidemiological information. The expected result is Negative.  Fact Sheet for Patients:  BloggerCourse.com  Fact Sheet for Healthcare Providers:  SeriousBroker.it  This test is no t yet approved or cleared by the [place] FDA and  has been authorized for detection and/or diagnosis of SARS-CoV-2 by FDA under an Emergency Use Authorization (EUA). This EUA will remain  in effect  (meaning this test can be used) for the duration of the COVID-19 declaration under Section 564(b)(1) of the Act, 21 U.S.C.section 360bbb-3(b)(1), unless the authorization is terminated  or revoked sooner.       Influenza A by PCR NEGATIVE NEGATIVE Final   Influenza B by PCR NEGATIVE NEGATIVE Final    Comment: (NOTE) The Xpert Xpress SARS-CoV-2/FLU/RSV plus assay is intended as an aid in the diagnosis of influenza from Nasopharyngeal swab specimens and should not be used as a sole basis for treatment. Nasal washings and aspirates are unacceptable for Xpert Xpress SARS-CoV-2/FLU/RSV testing.  Fact Sheet for Patients: BloggerCourse.com  Fact Sheet for Healthcare Providers: SeriousBroker.it  This test is not yet approved or cleared by the [place] FDA and has been authorized for detection and/or diagnosis of SARS-CoV-2 by FDA under an Emergency Use Authorization (EUA). This EUA will remain in effect (meaning this test can be used) for the duration of the COVID-19 declaration under Section 564(b)(1) of the Act, 21 U.S.C. section 360bbb-3(b)(1), unless the authorization is terminated or revoked.  Performed at [facility], [address] [place], [place] [address]   Urine Culture     Status: None   Collection Time: 07/22/21  9:30 AM   Specimen: Urine, Random  Result Value Ref Range Status   Specimen Description   Final    URINE, RANDOM Performed at [facility], [address], [place], [place] [address]    Special Requests   Final    NONE Performed at [facility], [address] [place], [place] [address]    Culture   Final    NO GROWTH Performed at [facility], [address] [place], [place] [address]    Report Status 07/23/2021 FINAL  Final  Blood Culture (routine x 2)     Status: None (Preliminary result)   Collection Time: 07/22/21 10:03 AM   Specimen: BLOOD  Result  Value Ref Range Status   Specimen Description   Final    BLOOD BLOOD LEFT HAND Performed at [facility], [address], [place], [place] [address]    Special Requests   Final    BOTTLES DRAWN AEROBIC AND ANAEROBIC Blood Culture adequate volume Performed at [facility], [address] [place], [place] [address]    Culture  Setup Time   Final    GRAM POSITIVE COCCI IN BOTH AEROBIC AND ANAEROBIC BOTTLES CRITICAL RESULT CALLED TO, READ BACK BY AND VERIFIED WITH: JASON ROBBINS @ 2300 ON 07/22/2021 BY CAF    Culture GRAM POSITIVE COCCI  Final   Report Status PENDING  Incomplete  Blood Culture (routine x 2)     Status: None (Preliminary result)   Collection  Time: 07/22/21 10:03 AM   Specimen: BLOOD  Result Value Ref Range Status   Specimen Description   Final    BLOOD RIGHT ANTECUBITAL Performed at [facility], [address] [place], [place] [address]    Special Requests   Final    BOTTLES DRAWN AEROBIC ONLY Blood Culture adequate volume Performed at [facility], [address] [place], [place] [address]    Culture  Setup Time   Final    GRAM POSITIVE COCCI AEROBIC BOTTLE ONLY CRITICAL RESULT CALLED TO, READ BACK BY AND VERIFIED WITH: JASON ROBBINS @ 2300 ON 07/22/2021 BY CAF Performed at [facility], [address] [place], [place] [address]    Culture GRAM POSITIVE COCCI  Final   Report Status PENDING  Incomplete  Blood Culture ID Panel (Reflexed)     Status: Abnormal   Collection Time: 07/22/21 10:03 AM  Result Value Ref Range Status   Enterococcus faecalis NOT DETECTED NOT DETECTED Final   Enterococcus Faecium NOT DETECTED NOT DETECTED Final   Listeria monocytogenes NOT DETECTED NOT DETECTED Final   Staphylococcus species NOT DETECTED NOT DETECTED Final   Staphylococcus aureus (BCID) NOT DETECTED NOT DETECTED Final   Staphylococcus epidermidis NOT DETECTED NOT DETECTED Final   Staphylococcus lugdunensis NOT DETECTED NOT  DETECTED Final   Streptococcus species DETECTED (A) NOT DETECTED Final    Comment: Not Enterococcus species, Streptococcus agalactiae, Streptococcus pyogenes, or Streptococcus pneumoniae. CRITICAL RESULT CALLED TO, READ BACK BY AND VERIFIED WITH: JASON ROBBINS @ 2300 ON 07/22/2021 BY CAF    Streptococcus agalactiae NOT DETECTED NOT DETECTED Final   Streptococcus pneumoniae NOT DETECTED NOT DETECTED Final   Streptococcus pyogenes NOT DETECTED NOT DETECTED Final   A.calcoaceticus-baumannii NOT DETECTED NOT DETECTED Final   Bacteroides fragilis NOT DETECTED NOT DETECTED Final   Enterobacterales NOT DETECTED NOT DETECTED Final   Enterobacter cloacae complex NOT DETECTED NOT DETECTED Final   Escherichia coli NOT DETECTED NOT DETECTED Final   Klebsiella aerogenes NOT DETECTED NOT DETECTED Final   Klebsiella oxytoca NOT DETECTED NOT DETECTED Final   Klebsiella pneumoniae NOT DETECTED NOT DETECTED Final   Proteus species NOT DETECTED NOT DETECTED Final   Salmonella species NOT DETECTED NOT DETECTED Final   Serratia marcescens NOT DETECTED NOT DETECTED Final   Haemophilus influenzae NOT DETECTED NOT DETECTED Final   Neisseria meningitidis NOT DETECTED NOT DETECTED Final   Pseudomonas aeruginosa NOT DETECTED NOT DETECTED Final   Stenotrophomonas maltophilia NOT DETECTED NOT DETECTED Final   Candida albicans NOT DETECTED NOT DETECTED Final   Candida auris NOT DETECTED NOT DETECTED Final   Candida glabrata NOT DETECTED NOT DETECTED Final   Candida krusei NOT DETECTED NOT DETECTED Final   Candida parapsilosis NOT DETECTED NOT DETECTED Final   Candida tropicalis NOT DETECTED NOT DETECTED Final   Cryptococcus neoformans/gattii NOT DETECTED NOT DETECTED Final    Comment: Performed at [facility], [address] [place], [place] [address]  MRSA Next Gen by PCR, Nasal     Status: None   Collection Time: 07/22/21  7:25 PM   Specimen: Nasal Mucosa; Nasal Swab  Result Value Ref Range Status    MRSA by PCR Next Gen NOT DETECTED NOT DETECTED Final    Comment: (NOTE) The GeneXpert MRSA Assay (FDA approved for NASAL specimens only), is one component of a comprehensive MRSA colonization surveillance program. It is not intended to diagnose MRSA infection nor to guide or monitor treatment for MRSA infections. Test performance is  not FDA approved in patients less than 44 years old. Performed at [facility], [address], [place], [place] [address]          Radiology Studies: CT Head Wo Contrast  Result Date: 07/22/2021 CLINICAL DATA:  Mental status change, atrial fibrillation, dementia EXAM: CT HEAD WITHOUT CONTRAST TECHNIQUE: Contiguous axial images were obtained from the base of the skull through the vertex without intravenous contrast. COMPARISON:  03/02/2021 FINDINGS: Brain: Atrophy and white matter microvascular ischemic changes throughout both cerebral hemispheres. No acute intracranial hemorrhage, new mass lesion, definite infarction, midline shift, herniation, hydrocephalus, or extra-axial fluid collection. No focal mass effect or edema. Cisterns are patent. Cerebellar atrophy as well. Vascular: No hyperdense vessel or unexpected calcification. Skull: Normal. Negative for fracture or focal lesion. Sinuses/Orbits: No acute finding. Other: None. IMPRESSION: Chronic atrophy and white matter microvascular ischemic changes. No acute intracranial abnormality by noncontrast CT. Electronically Signed   By: Judie Petit.  Shick M.D.   On: 07/22/2021 12:09   CT Abdomen Pelvis W Contrast  Result Date: 07/22/2021 CLINICAL DATA:  Evaluate for abdominal abscess or infection. EXAM: CT ABDOMEN AND PELVIS WITH CONTRAST TECHNIQUE: Multidetector CT imaging of the abdomen and pelvis was performed using the standard protocol following bolus administration of intravenous contrast. CONTRAST:  75mL OMNIPAQUE IOHEXOL 350 MG/ML SOLN COMPARISON:  CT abdomen pelvis 11/21/2005. FINDINGS: Lower chest: Normal  heart size. Dependent atelectasis within the bilateral lower lobes. Hepatobiliary: Streak artifact limits evaluation. Liver is normal in size and contour. Gallbladder is unremarkable. Pancreas: Unremarkable Spleen: Unremarkable Adrenals/Urinary Tract: Normal adrenal glands. Kidneys enhance symmetrically with contrast. Small amount of perinephric fluid bilaterally. No hydronephrosis. Urinary bladder is unremarkable. Stomach/Bowel: Stool throughout the colon. No abnormal bowel wall thickening or evidence for bowel obstruction. No free fluid or free intraperitoneal air. Normal appendix. Normal morphology of the stomach. Vascular/Lymphatic: Normal caliber abdominal aorta. No retroperitoneal lymphadenopathy. Reproductive: Heterogeneous prostate. Other: None. Musculoskeletal: Lower thoracic and lumbar spine degenerative changes. No aggressive or acute appearing osseous lesions. IMPRESSION: Limited exam due to streak artifact. Small amount of perinephric fluid bilaterally. Recommend correlation with urinalysis to exclude urinary tract infection. Otherwise no acute process within the abdomen or pelvis. Electronically Signed   By: Annia Belt M.D.   On: 07/22/2021 12:13   US RENAL  Result Date: 07/22/2021 CLINICAL DATA:  Abnormal urologic system. EXAM: RENAL / URINARY TRACT ULTRASOUND COMPLETE COMPARISON:  CT abdomen pelvis dated 07/22/2021 and renal ultrasound dated 01/30/2017. FINDINGS: Right Kidney: Renal measurements: 11.6 x 6.1 x 5.4 cm = volume: 200 mL. Echogenicity is increased. No mass or hydronephrosis visualized. Left Kidney: Renal measurements: 10.1 x 6.1 x 5.8 cm = volume: 187 mL. Echogenicity is increased. No mass or hydronephrosis visualized. Bladder: Appears normal for degree of bladder distention. Other: The exam is limited due to patient body habitus. IMPRESSION: No hydronephrosis. Increased renal cortical echogenicity is consistent with chronic medical renal disease. Electronically Signed   By: Romona Curls M.D.   On: 07/22/2021 15:23   DG Chest Port 1 View  Result Date: 07/22/2021 CLINICAL DATA:  Altered mental status, unresponsive, fever, sepsis evaluation EXAM: PORTABLE CHEST 1 VIEW COMPARISON:  03/02/2021 FINDINGS: Low lung volumes with vascular prominence and basilar atelectasis. Heart is enlarged. No focal pneumonia, acute airspace process, effusion, or edema. No pneumothorax. Trachea midline. Degenerative changes of the spine. IMPRESSION: Low volume exam with cardiomegaly and basilar atelectasis. Electronically Signed   By: Judie Petit.  Shick M.D.   On: 07/22/2021 10:35   ECHOCARDIOGRAM  COMPLETE  Result Date: 07/24/2021    ECHOCARDIOGRAM REPORT   Patient Name:   Brian Dodson Date of Exam: 07/23/2021 Medical Rec #:  [id number]     Height:       72.0 in Accession #:    [id number]    Weight:       220.0 lb Date of Birth:  [birth year]    BSA:          2.219 m Patient Age:    64 years      BP:           138/96 mmHg Patient Gender: M             HR:           47 bpm. Exam Location:  [facility] Procedure: 2D Echo Indications:     Bactermia R78.81  History:         Patient has prior history of Echocardiogram examinations, most                  recent 03/03/2021.  Sonographer:     Overton Mam RDCS Referring Phys:  [id number] Earl Lites Anatalia Kronk Diagnosing Phys: Arnoldo Hooker MD IMPRESSIONS  1. Left ventricular ejection fraction, by estimation, is 60 to 65%. The left ventricle has normal function. The left ventricle has no regional wall motion abnormalities. Left ventricular diastolic parameters were normal.  2. Right ventricular systolic function is normal. The right ventricular size is normal.  3. The mitral valve is normal in structure. Trivial mitral valve regurgitation.  4. The aortic valve is normal in structure. Aortic valve regurgitation is trivial. FINDINGS  Left Ventricle: Left ventricular ejection fraction, by estimation, is 60 to 65%. The left ventricle has normal function. The left ventricle has no regional  wall motion abnormalities. The left ventricular internal cavity size was small. There is no left ventricular hypertrophy. Left ventricular diastolic parameters were normal. Right Ventricle: The right ventricular size is normal. No increase in right ventricular wall thickness. Right ventricular systolic function is normal. Left Atrium: Left atrial size was normal in size. Right Atrium: Right atrial size was normal in size. Pericardium: There is no evidence of pericardial effusion. Mitral Valve: The mitral valve is normal in structure. Trivial mitral valve regurgitation. Tricuspid Valve: The tricuspid valve is normal in structure. Tricuspid valve regurgitation is trivial. Aortic Valve: The aortic valve is normal in structure. Aortic valve regurgitation is trivial. Aortic valve peak gradient measures 3.5 mmHg. Pulmonic Valve: The pulmonic valve was normal in structure. Pulmonic valve regurgitation is not visualized. Aorta: The aortic root and ascending aorta are structurally normal, with no evidence of dilitation. IAS/Shunts: No atrial level shunt detected by color flow Doppler.  LEFT VENTRICLE PLAX 2D LVIDd:         4.55 cm  Diastology LVIDs:         3.10 cm  LV e' medial:    9.14 cm/s LV PW:         1.10 cm  LV E/e' medial:  8.5 LV IVS:        1.35 cm  LV e' lateral:   15.90 cm/s LVOT diam:     2.60 cm  LV E/e' lateral: 4.9 LV SV:         65 LV SV Index:   29 LVOT Area:     5.31 cm  RIGHT VENTRICLE RV Basal diam:  4.00 cm RV S prime:     14.90 cm/s TAPSE (M-mode): 2.7 cm LEFT  ATRIUM             Index       RIGHT ATRIUM           Index LA diam:        4.20 cm 1.89 cm/m  RA Area:     24.80 cm LA Vol (A2C):   53.2 ml 23.98 ml/m RA Volume:   97.60 ml  43.99 ml/m LA Vol (A4C):   32.1 ml 14.47 ml/m LA Biplane Vol: 42.4 ml 19.11 ml/m  AORTIC VALVE                PULMONIC VALVE AV Area (Vmax): 3.70 cm    PV Vmax:       0.99 m/s AV Vmax:        93.70 cm/s  PV Peak grad:  3.9 mmHg AV Peak Grad:   3.5 mmHg LVOT Vmax:       65.30 cm/s LVOT Vmean:     46.700 cm/s LVOT VTI:       0.122 m  AORTA Ao Root diam: 3.50 cm Ao Asc diam:  3.20 cm MITRAL VALVE MV Area (PHT): 2.76 cm    SHUNTS MV Decel Time: 275 msec    Systemic VTI:  0.12 m MV E velocity: 78.00 cm/s  Systemic Diam: 2.60 cm Arnoldo HookerBruce Kowalski MD Electronically signed by Arnoldo HookerBruce Kowalski MD Signature Date/Time: 07/24/2021/6:34:31 AM    Final         Scheduled Meds:  apixaban  5 mg Oral BID   aspirin EC  81 mg Oral Daily   atorvastatin  10 mg Oral Daily   benztropine  1 mg Oral QHS   brimonidine  1 drop Both Eyes BID   And   timolol  1 drop Both Eyes BID   carvedilol  6.25 mg Oral BID WC   citalopram  20 mg Oral Daily   divalproex  2,000 mg Oral QHS   donepezil  10 mg Oral QHS   insulin aspart  0-9 Units Subcutaneous TID WC   latanoprost  1 drop Both Eyes QHS   losartan  25 mg Oral Daily   pantoprazole  40 mg Oral Daily   risperidone  4 mg Oral QHS   sodium chloride flush  3 mL Intravenous Q12H   Continuous Infusions:  cefTRIAXone (ROCEPHIN)  IV 2 g (07/24/21 0110)   lactated ringers 100 mL/hr at 07/24/21 0024     LOS: 2 days    Time spent: 25 minutes    Alberteen Samhristopher P Senia Even, MD [facility] 07/24/2021, 9:19 AM     Please page though AMION or Epic secure chat:  For [employer] Corporation[employer] password, Higher education careers advisercontact charge nurse

## 2021-07-24 NOTE — Consult Note (Signed)
NAME: Brian Dodson  DOB: 02-03-1956  MRN: [id number]  Date/Time: 07/24/2021 1:40 PM  REQUESTING PROVIDER: Dr.Danford Subjective:  REASON FOR CONSULT: bacteremia ?Pt isa poor historian, chart reviewed Brian Dodson is a 65 y.o. with a history of schizophrenia, dementia, HTN, DM, afib on eliquis presented from Group home with altered mental status , fever and chills Temp was 103 on admission, BP 117/79, HR 33, sats 100% Wbc 6.5, Hb 11.4, cr 1.10, platelet 93. Blood culture sent Started on vanco/cefepime and flagyl. CT head Chronic atrophy and white matter microvascular ischemic changes. CT abdomen and pelvis Small amount of perinephric fluid bilaterally. UA had no wbc, neg nitrite. Cxr Low volume exam with cardiomegaly and basilar atelectasis. Blood culture came back positive for strep species ad I am consulted for same Pt does not know why he is in th hospital He denies any pain, fever, chills, cough, sob  Past Medical History:  Diagnosis Date   Asthma    Atrial fibrillation (HCC)    Benign prostatic hypertrophy with lower urinary tract symptoms (LUTS)    Bulbous urethral stricture    CAD (coronary artery disease)    s/p coronary stent 2003   Constipation    COPD (chronic obstructive pulmonary disease) (HCC)    Diabetes mellitus without complication (HCC)    Dizziness    Dyspnea    GERD (gastroesophageal reflux disease)    Gross hematuria    Hyperlipemia    Hypertension    Lumbago    Palpitation    Schizophrenia (HCC)     Past Surgical History:  Procedure Laterality Date   COLONOSCOPY     CORONARY ANGIOPLASTY WITH STENT PLACEMENT  11/2003   kidney stent      Social History   Socioeconomic History   Marital status: Single    Spouse name: Not on file   Number of children: Not on file   Years of education: Not on file   Highest education level: Not on file  Occupational History   Not on file  Tobacco Use   Smoking status: Former   Smokeless tobacco: Never   Substance and Sexual Activity   Alcohol use: No   Drug use: No   Sexual activity: Not on file  Other Topics Concern   Not on file  Social History Narrative   Not on file   Social Determinants of Health   Financial Resource Strain: Not on file  Food Insecurity: Not on file  Transportation Needs: Not on file  Physical Activity: Not on file  Stress: Not on file  Social Connections: Not on file  Intimate Partner Violence: Not on file    Family History  Problem Relation Age of Onset   Diabetes Other    Bladder Cancer Neg Hx    Kidney cancer Neg Hx    Prostate cancer Neg Hx    Allergies  Allergen Reactions   Xarelto [Rivaroxaban]    I? Current Facility-Administered Medications  Medication Dose Route Frequency Provider Last Rate Last Admin   acetaminophen (TYLENOL) tablet 650 mg  650 mg Oral Q6H PRN Venora MaplesEckstat, Matthew M, MD       Or   acetaminophen (TYLENOL) suppository 650 mg  650 mg Rectal Q6H PRN Venora MaplesEckstat, Matthew M, MD       apixaban Everlene Balls(ELIQUIS) tablet 5 mg  5 mg Oral BID Venora MaplesEckstat, Matthew M, MD   5 mg at 07/24/21 [id number]   aspirin EC tablet 81 mg  81 mg Oral Daily Venora MaplesEckstat, Matthew M,  MD   81 mg at 07/24/21 0927   atorvastatin (LIPITOR) tablet 10 mg  10 mg Oral Daily Venora Maples, MD   10 mg at 07/24/21 1025   benztropine (COGENTIN) tablet 1 mg  1 mg Oral QHS Venora Maples, MD   1 mg at 07/23/21 2150   brimonidine (ALPHAGAN) 0.2 % ophthalmic solution 1 drop  1 drop Both Eyes BID Venora Maples, MD   1 drop at 07/24/21 8527   And   timolol (TIMOPTIC) 0.5 % ophthalmic solution 1 drop  1 drop Both Eyes BID Venora Maples, MD   1 drop at 07/24/21 0931   carvedilol (COREG) tablet 6.25 mg  6.25 mg Oral BID WC Venora Maples, MD   6.25 mg at 07/24/21 7824   cefTRIAXone (ROCEPHIN) 2 g in sodium chloride 0.9 % 100 mL IVPB  2 g Intravenous Q24H Opyd, Lavone Neri, MD   Stopped at 07/24/21 0140   citalopram (CELEXA) tablet 20 mg  20 mg Oral Daily Venora Maples, MD    20 mg at 07/24/21 2353   divalproex (DEPAKOTE ER) 24 hr tablet 2,000 mg  2,000 mg Oral QHS Venora Maples, MD   2,000 mg at 07/23/21 2149   donepezil (ARICEPT) tablet 10 mg  10 mg Oral QHS Venora Maples, MD   10 mg at 07/23/21 2148   ibuprofen (ADVIL) tablet 600 mg  600 mg Oral Q6H PRN Venora Maples, MD   600 mg at 07/22/21 1403   insulin aspart (novoLOG) injection 0-9 Units  0-9 Units Subcutaneous TID WC Venora Maples, MD   1 Units at 07/23/21 1315   ketorolac (TORADOL) 15 MG/ML injection 15 mg  15 mg Intravenous Q6H PRN Venora Maples, MD       lactated ringers infusion   Intravenous Continuous Venora Maples, MD 100 mL/hr at 07/24/21 1054 New Bag at 07/24/21 1054   latanoprost (XALATAN) 0.005 % ophthalmic solution 1 drop  1 drop Both Eyes QHS Venora Maples, MD   1 drop at 07/23/21 2158   losartan (COZAAR) tablet 25 mg  25 mg Oral Daily Venora Maples, MD   25 mg at 07/24/21 0927   ondansetron (ZOFRAN) tablet 4 mg  4 mg Oral Q6H PRN Venora Maples, MD       Or   ondansetron [facility]) injection 4 mg  4 mg Intravenous Q6H PRN Venora Maples, MD       pantoprazole (PROTONIX) EC tablet 40 mg  40 mg Oral Daily Venora Maples, MD   40 mg at 07/24/21 6144   polyethylene glycol (MIRALAX / GLYCOLAX) packet 17 g  17 g Oral Daily PRN Venora Maples, MD       risperiDONE (RISPERDAL) tablet 4 mg  4 mg Oral QHS Venora Maples, MD   4 mg at 07/23/21 2151   sodium chloride flush (NS) 0.9 % injection 3 mL  3 mL Intravenous Q12H Venora Maples, MD   3 mL at 07/24/21 3154   traZODone (DESYREL) tablet 25 mg  25 mg Oral QHS PRN Venora Maples, MD   25 mg at 07/23/21 2148     Abtx:  Anti-infectives (From admission, onward)    Start     Dose/Rate Route Frequency Ordered Stop   07/23/21 0115  cefTRIAXone (ROCEPHIN) 2 g in sodium chloride 0.9 % 100 mL IVPB        2 g 200 mL/hr  over 30 Minutes Intravenous Every 24 hours 07/23/21 0016     07/23/21 0000   vancomycin (VANCOREADY) IVPB 750 mg/150 mL  Status:  Discontinued        750 mg 150 mL/hr over 60 Minutes Intravenous Every 12 hours 07/22/21 1326 07/23/21 0014   07/22/21 2200  metroNIDAZOLE (FLAGYL) IVPB 500 mg  Status:  Discontinued        500 mg 100 mL/hr over 60 Minutes Intravenous Every 12 hours 07/22/21 1326 07/22/21 1402   07/22/21 2200  ceFEPIme (MAXIPIME) 2 g in sodium chloride 0.9 % 100 mL IVPB  Status:  Discontinued        2 g 200 mL/hr over 30 Minutes Intravenous Every 8 hours 07/22/21 1326 07/23/21 0015   07/22/21 2200  metroNIDAZOLE (FLAGYL) IVPB 500 mg  Status:  Discontinued        500 mg 100 mL/hr over 60 Minutes Intravenous Every 12 hours 07/22/21 1402 07/23/21 0015   07/22/21 1400  metroNIDAZOLE (FLAGYL) IVPB 500 mg  Status:  Discontinued        500 mg 100 mL/hr over 60 Minutes Intravenous Every 12 hours 07/22/21 1355 07/22/21 1402   07/22/21 0900  ceFEPIme (MAXIPIME) 2 g in sodium chloride 0.9 % 100 mL IVPB        2 g 200 mL/hr over 30 Minutes Intravenous  Once 07/22/21 0847 07/22/21 1046   07/22/21 0900  metroNIDAZOLE (FLAGYL) IVPB 500 mg        500 mg 100 mL/hr over 60 Minutes Intravenous  Once 07/22/21 0847 07/22/21 1237   07/22/21 0900  vancomycin (VANCOCIN) IVPB 1000 mg/200 mL premix  Status:  Discontinued        1,000 mg 200 mL/hr over 60 Minutes Intravenous  Once 07/22/21 0847 07/22/21 0851   07/22/21 0900  vancomycin (VANCOREADY) IVPB 2000 mg/400 mL        2,000 mg 200 mL/hr over 120 Minutes Intravenous  Once 07/22/21 0851 07/22/21 1402       REVIEW OF SYSTEMS:  Const:  fever, negative chills, negative weight loss Eyes: negative diplopia or visual changes, negative eye pain ENT: negative coryza, negative sore throat Resp: negative cough, hemoptysis, dyspnea Cards: negative for chest pain, palpitations, lower extremity edema GU: negative for frequency, dysuria and hematuria GI: Negative for abdominal pain, diarrhea, bleeding, constipation Skin:  negative for rash and pruritus Heme: negative for easy bruising and gum/nose bleeding MS: negative for myalgias, arthralgias, back pain and muscle weakness Neurolo:negative for headaches, dizziness, vertigo, memory problems  Psych: negative for feelings of anxiety, depression  Endocrine: negative for thyroid, diabetes Allergy/Immunology- negative for any medication or food allergies ?Pertinent Positives include : Objective:  VITALS:  BP 124/85 (BP Location: Left Arm)   Pulse 74   Temp 98.9 F (37.2 C)   Resp 16   Ht 6' (1.829 m)   Wt 99.8 kg   SpO2 98%   BMI 29.84 kg/m  PHYSICAL EXAM:  General: Alert, pleasantly confused cooperative, no distress, appears stated age.  Head: Normocephalic, without obvious abnormality, atraumatic. Eyes: Conjunctivae clear, anicteric sclerae. Pupils are equal ENT Nares normal. No drainage or sinus tenderness. Lips, mucosa, and tongue normal. No Thrush Neck: Supple, symmetrical, no adenopathy, thyroid: non tender no carotid bruit and no JVD. Back: No CVA tenderness. Lungs: Clear to auscultation bilaterally. No Wheezing or Rhonchi. No rales. Heart: Regular rate and rhythm, no murmur, rub or gallop. Abdomen: Soft, non-tender,not distended. Bowel sounds normal. No masses Extremities: rt inguinal area upper  thigh there is an area of erythema- no tenderness Mild swelling   atraumatic, no cyanosis. No edema. No clubbing Perianal skin- white, atrophic Skin: No rashes or lesions. Or bruising Lymph: Cervical, supraclavicular normal. Neurologic: Grossly non-focal Pertinent Labs Lab Results CBC    Component Value Date/Time   WBC 6.4 07/24/2021 0537   RBC 3.29 (L) 07/24/2021 0537   HGB 10.9 (L) 07/24/2021 0537   HGB 9.4 (L) 02/14/2015 1427   HCT 33.5 (L) 07/24/2021 0537   HCT 29.6 (L) 02/14/2015 1427   PLT 83 (L) 07/24/2021 0537   PLT 106 (L) 02/14/2015 1427   MCV 101.8 (H) 07/24/2021 0537   MCV 101 (H) 02/14/2015 1427   MCH 33.1 07/24/2021  0537   MCHC 32.5 07/24/2021 0537   RDW 14.4 07/24/2021 0537   RDW 15.3 (H) 02/14/2015 1427   LYMPHSABS 0.8 07/22/2021 0849   LYMPHSABS 2.8 02/14/2015 1427   MONOABS 0.7 07/22/2021 0849   MONOABS 0.5 02/14/2015 1427   EOSABS 0.0 07/22/2021 0849   EOSABS 0.1 02/14/2015 1427   BASOSABS 0.0 07/22/2021 0849   BASOSABS 0.0 02/14/2015 1427    CMP Latest Ref Rng & Units 07/24/2021 07/23/2021 07/22/2021  Glucose 70 - 99 mg/dL 91 89 96  BUN 8 - 23 mg/dL 18 18 17   Creatinine 0.61 - 1.24 mg/dL 8.67 6.72  Sodium 135 - 145 mmol/L 138 137 139  Potassium 3.5 - 5.1 mmol/L 4.5 4.4 4.5  Chloride 98 - 111 mmol/L 103 105 103  CO2 22 - 32 mmol/L 30 27 31   Calcium 8.9 - 10.3 mg/dL 0.94) 8.9 )  Total Protein 6.5 - 8.1 g/dL - - 6.9  Total Bilirubin 0.3 - 1.2 mg/dL - - 0.6  Alkaline Phos 38 - 126 U/L - - 54  AST 15 - 41 U/L - - 23  ALT 0 - 44 U/L - - 14      Microbiology: Recent Results (from the past 240 hour(s))  Resp Panel by RT-PCR (Flu A&B, Covid) Nasopharyngeal Swab     Status: None   Collection Time: 07/22/21  8:47 AM   Specimen: Nasopharyngeal Swab; Nasopharyngeal(NP) swabs in vial transport medium  Result Value Ref Range Status   SARS Coronavirus 2 by RT PCR NEGATIVE NEGATIVE Final    Comment: (NOTE) SARS-CoV-2 target nucleic acids are NOT DETECTED.  The SARS-CoV-2 RNA is generally detectable in upper respiratory specimens during the acute phase of infection. The lowest concentration of SARS-CoV-2 viral copies this assay can detect is 138 copies/mL. A negative result does not preclude SARS-Cov-2 infection and should not be used as the sole basis for treatment or other patient management decisions. A negative result may occur with  improper specimen collection/handling, submission of specimen other than nasopharyngeal swab, presence of viral mutation(s) within the areas targeted by this assay, and inadequate number of viral copies(<138 copies/mL). A negative result must be  combined with clinical observations, patient history, and epidemiological information. The expected result is Negative.  Fact Sheet for Patients:  6.2(E  Fact Sheet for Healthcare Providers:  09/21/21  This test is no t yet approved or cleared by the [employer] FDA and  has been authorized for detection and/or diagnosis of SARS-CoV-2 by FDA under an Emergency Use Authorization (EUA). This EUA will remain  in effect (meaning this test can be used) for the duration of the COVID-19 declaration under Section 564(b)(1) of the Act, 21 U.S.C.section 360bbb-3(b)(1), unless the authorization is terminated  or revoked sooner.  Influenza A by PCR NEGATIVE NEGATIVE Final   Influenza B by PCR NEGATIVE NEGATIVE Final    Comment: (NOTE) The Xpert Xpress SARS-CoV-2/FLU/RSV plus assay is intended as an aid in the diagnosis of influenza from Nasopharyngeal swab specimens and should not be used as a sole basis for treatment. Nasal washings and aspirates are unacceptable for Xpert Xpress SARS-CoV-2/FLU/RSV testing.  Fact Sheet for Patients: [employer]  Fact Sheet for Healthcare Providers: [employer]  This test is not yet approved or cleared by the [place] FDA and has been authorized for detection and/or diagnosis of SARS-CoV-2 by FDA under an Emergency Use Authorization (EUA). This EUA will remain in effect (meaning this test can be used) for the duration of the COVID-19 declaration under Section 564(b)(1) of the Act, 21 U.S.C. section 360bbb-3(b)(1), unless the authorization is terminated or revoked.  Performed at [facility], [address] [place], [place] [address]   Urine Culture     Status: None   Collection Time: 07/22/21  9:30 AM   Specimen: Urine, Random  Result Value Ref Range Status   Specimen Description   Final     URINE, RANDOM Performed at [facility], [address] [place], [place] [address]    Special Requests   Final    NONE Performed at [facility], [address], [place], [place] [address]    Culture   Final    NO GROWTH Performed at [facility], [address] [place], [place] [address]    Report Status 07/23/2021 FINAL  Final  Blood Culture (routine x 2)     Status: Abnormal (Preliminary result)   Collection Time: 07/22/21 10:03 AM   Specimen: BLOOD  Result Value Ref Range Status   Specimen Description   Final    BLOOD BLOOD LEFT HAND Performed at [facility], [address], [place], [place] [address]    Special Requests   Final    BOTTLES DRAWN AEROBIC AND ANAEROBIC Blood Culture adequate volume Performed at [facility], [address] [place], [place] [address]    Culture  Setup Time   Final    GRAM POSITIVE COCCI IN BOTH AEROBIC AND ANAEROBIC BOTTLES CRITICAL RESULT CALLED TO, READ BACK BY AND VERIFIED WITH: JASON ROBBINS @ 2300 ON 07/22/2021 BY CAF    Culture STREPTOCOCCUS GROUP G (A)  Final   Report Status PENDING  Incomplete  Blood Culture (routine x 2)     Status: Abnormal (Preliminary result)   Collection Time: 07/22/21 10:03 AM   Specimen: BLOOD  Result Value Ref Range Status   Specimen Description   Final    BLOOD RIGHT ANTECUBITAL Performed at [facility], [address] [place], [place] [address]    Special Requests   Final    BOTTLES DRAWN AEROBIC ONLY Blood Culture adequate volume Performed at [facility], [address] [place], [place] [address]    Culture  Setup Time   Final    GRAM POSITIVE COCCI AEROBIC BOTTLE ONLY CRITICAL RESULT CALLED TO, READ BACK BY AND VERIFIED WITH: JASON ROBBINS @ 2300 ON 07/22/2021 BY CAF    Culture (A)  Final    STREPTOCOCCUS GROUP G SUSCEPTIBILITIES TO FOLLOW Performed at [facility], [address] [place], [place] [address]     Report Status PENDING  Incomplete  Blood Culture ID Panel (Reflexed)     Status: Abnormal   Collection Time: 07/22/21 10:03 AM  Result Value Ref  Range Status   Enterococcus faecalis NOT DETECTED NOT DETECTED Final   Enterococcus Faecium NOT DETECTED NOT DETECTED Final   Listeria monocytogenes NOT DETECTED NOT DETECTED Final   Staphylococcus species NOT DETECTED NOT DETECTED Final   Staphylococcus aureus (BCID) NOT DETECTED NOT DETECTED Final   Staphylococcus epidermidis NOT DETECTED NOT DETECTED Final   Staphylococcus lugdunensis NOT DETECTED NOT DETECTED Final   Streptococcus species DETECTED (A) NOT DETECTED Final    Comment: Not Enterococcus species, Streptococcus agalactiae, Streptococcus pyogenes, or Streptococcus pneumoniae. CRITICAL RESULT CALLED TO, READ BACK BY AND VERIFIED WITH: JASON ROBBINS @ 2300 ON 07/22/2021 BY CAF    Streptococcus agalactiae NOT DETECTED NOT DETECTED Final   Streptococcus pneumoniae NOT DETECTED NOT DETECTED Final   Streptococcus pyogenes NOT DETECTED NOT DETECTED Final   A.calcoaceticus-baumannii NOT DETECTED NOT DETECTED Final   Bacteroides fragilis NOT DETECTED NOT DETECTED Final   Enterobacterales NOT DETECTED NOT DETECTED Final   Enterobacter cloacae complex NOT DETECTED NOT DETECTED Final   Escherichia coli NOT DETECTED NOT DETECTED Final   Klebsiella aerogenes NOT DETECTED NOT DETECTED Final   Klebsiella oxytoca NOT DETECTED NOT DETECTED Final   Klebsiella pneumoniae NOT DETECTED NOT DETECTED Final   Proteus species NOT DETECTED NOT DETECTED Final   Salmonella species NOT DETECTED NOT DETECTED Final   Serratia marcescens NOT DETECTED NOT DETECTED Final   Haemophilus influenzae NOT DETECTED NOT DETECTED Final   Neisseria meningitidis NOT DETECTED NOT DETECTED Final   Pseudomonas aeruginosa NOT DETECTED NOT DETECTED Final   Stenotrophomonas maltophilia NOT DETECTED NOT DETECTED Final   Candida albicans NOT DETECTED NOT DETECTED Final   Candida  auris NOT DETECTED NOT DETECTED Final   Candida glabrata NOT DETECTED NOT DETECTED Final   Candida krusei NOT DETECTED NOT DETECTED Final   Candida parapsilosis NOT DETECTED NOT DETECTED Final   Candida tropicalis NOT DETECTED NOT DETECTED Final   Cryptococcus neoformans/gattii NOT DETECTED NOT DETECTED Final    Comment: Performed at [facility], [address] [place], [place] [address]  MRSA Next Gen by PCR, Nasal     Status: None   Collection Time: 07/22/21  7:25 PM   Specimen: Nasal Mucosa; Nasal Swab  Result Value Ref Range Status   MRSA by PCR Next Gen NOT DETECTED NOT DETECTED Final    Comment: (NOTE) The GeneXpert MRSA Assay (FDA approved for NASAL specimens only), is one component of a comprehensive MRSA colonization surveillance program. It is not intended to diagnose MRSA infection nor to guide or monitor treatment for MRSA infections. Test performance is not FDA approved in patients less than 47 years old. Performed at [facility], [address] [place], [place] [address]   CULTURE, BLOOD (ROUTINE X 2) w Reflex to ID Panel     Status: None (Preliminary result)   Collection Time: 07/23/21 10:41 PM   Specimen: BLOOD LEFT HAND  Result Value Ref Range Status   Specimen Description BLOOD LEFT HAND  Final   Special Requests   Final    BOTTLES DRAWN AEROBIC AND ANAEROBIC Blood Culture adequate volume   Culture   Final    NO GROWTH < 12 HOURS Performed at [facility], [address] [place], [place] [address]    Report Status PENDING  Incomplete  CULTURE, BLOOD (ROUTINE X 2) w Reflex to ID Panel     Status: None (Preliminary result)   Collection Time: 07/23/21 10:41 PM   Specimen: BLOOD RIGHT HAND  Result Value Ref Range  Status   Specimen Description BLOOD RIGHT HAND  Final   Special Requests   Final    BOTTLES DRAWN AEROBIC AND ANAEROBIC Blood Culture adequate volume   Culture   Final    NO GROWTH < 12 HOURS Performed at [facility], [address] [place], [place] [address]    Report Status PENDING  Incomplete    IMAGING RESULTS:  I have personally reviewed the films Normal? Impression/Recommendation ? ?Group G streptococcus bacteremia- possible source skin- there is an area of cellulitis rt upper thigh- monior colsely No evidence of GI source Continue Ceftriaxone- await repeat culture to decide on duration and switch to PO 2 d echo no vegetation Low suspicion for endocarditis so TEE is not needed  ?DM- on insulin sliding scale Well controlled  Schizophrenia  Dementia  ___________________________________________________ Discussed with his nurse Note:  This document was prepared using Dragon voice recognition software and may include unintentional dictation errors.

## 2021-07-25 ENCOUNTER — Encounter
Admission: EM | Disposition: A | Discharge status: Nursing Home (Includes ICF) | Payer: Self-pay | Source: Skilled Nursing Facility | Attending: Family Medicine

## 2021-07-25 LAB — PATHOLOGIST SMEAR REVIEW

## 2021-07-25 LAB — CULTURE, BLOOD (ROUTINE X 2)
Special Requests: ADEQUATE
Special Requests: ADEQUATE

## 2021-07-25 LAB — CBC
HCT: 31 % — ABNORMAL LOW (ref 39.0–52.0)
Hemoglobin: 10.2 g/dL — ABNORMAL LOW (ref 13.0–17.0)
MCH: 32.7 pg (ref 26.0–34.0)
MCHC: 32.9 g/dL (ref 30.0–36.0)
MCV: 99.4 fL (ref 80.0–100.0)
Platelets: 88 10*3/uL — ABNORMAL LOW (ref 150–400)
RBC: 3.12 MIL/uL — ABNORMAL LOW (ref 4.22–5.81)
RDW: 14.2 % (ref 11.5–15.5)
WBC: 5.5 10*3/uL (ref 4.0–10.5)
nRBC: 0 % (ref 0.0–0.2)

## 2021-07-25 LAB — GLUCOSE, CAPILLARY
Glucose-Capillary: 80 mg/dL (ref 70–99)
Glucose-Capillary: 106 mg/dL — ABNORMAL HIGH (ref 70–99)
Glucose-Capillary: 79 mg/dL (ref 70–99)
Glucose-Capillary: 191 mg/dL — ABNORMAL HIGH (ref 70–99)

## 2021-07-25 LAB — BASIC METABOLIC PANEL
Anion gap: 6 (ref 5–15)
BUN: 16 mg/dL (ref 8–23)
CO2: 29 mmol/L (ref 22–32)
Calcium: 8.7 mg/dL — ABNORMAL LOW (ref 8.9–10.3)
Chloride: 102 mmol/L (ref 98–111)
Creatinine, Ser: 1.02 mg/dL (ref 0.61–1.24)
GFR, Estimated: >60 mL/min (ref >60)
Glucose, Bld: 113 mg/dL — ABNORMAL HIGH (ref 70–99)
Potassium: 4.3 mmol/L (ref 3.5–5.1)
Sodium: 137 mmol/L (ref 135–145)

## 2021-07-25 SURGERY — ECHOCARDIOGRAM, TRANSESOPHAGEAL
Anesthesia: Moderate Sedation

## 2021-07-25 MED ORDER — SODIUM CHLORIDE 0.9 % IV SOLN: INTRAVENOUS | Status: DC

## 2021-07-25 NOTE — Progress Notes (Signed)
IV team notified that Dr Maryfrances Bunnell wants the midline placed on 9/7 early morning

## 2021-07-25 NOTE — Progress Notes (Signed)
[facility] PROGRESS NOTE    Brian Dodson  ZOX:[id number] DOB: [birth year] DOA: 07/22/2021 PCP: Galvin Proffer, MD      Brief Narrative:  [name] is a 65 y.o. M with schizophrenia, dementia living long-term in a nursing home, CAD, A. fib, hypertension, type 2 diabetes who presented with fever from his nursing home.  Staff at [facility] found patient with shaking chills, and less responsive than baseline.  In the ER, CXR clear, UA unremarkable.  Started on empiric antibioics and cultures drawn.  Overnight blood cultures growing staph lugdunensis in 3/4 bottles           Assessment & Plan:  Severe sepsis due to group G strep bacteremia Patient appeared to have severe sepsis (febrile, heart rate 100s, thrombocytopenia, decreased mentation) at admission, started on Vanco, cefepime, and Flagyl.    Cultures grew group G Streptococcus species.  He was transitioned to Rocephin.  ID consulted, echo normal.  Doubt endocarditis, TEE deferred.  Since Rocephin, he is feeling well.  Repeat cultures will be 48 hours old at 10pm tonight.  After maturity, if repeat cultures negative, discussed with ID either: - One final dose Rocephin on 9/7, then discharge to [facility] (home) with Amoxicillin 1g TID for 7 days  OR - Place midline and discharge to SNF       Acute metabolic encephalopathy superimposed on dementia  At baseline, patient is "chipper" and active.  On admission he was somnolent and confused.  He is now back to his baseline. - Continue donepezil  Thrombcytopenia Mild, stable, due to sepsis  Paroxysmal Atrial fibrillation Rate controlled - Continue apixaban, carvedilol  Coronary disease, secondary prevention Hypertension Blood pressure controlled - Continue aspirin, atorvastatin, carvedilol, losartan  Depression Schizophrenia - Continue citalopram, benztropine, risperidone, Depakote   Diabetes glucose normal - Continue sliding scale corrections            Disposition: Status is: Inpatient  Remains inpatient appropriate because:Ongoing diagnostic testing needed not appropriate for outpatient work up  Dispo: The patient is from: Home              Anticipated d/c is to: Home              Patient currently is not medically stable to d/c.   Difficult to place patient No   Level of care: Med-Surg    Patient admitted with severe sepsis, found to have strep bactermia.  ID consultd and they recommend completing treatment either oral or IV, if cultures negative tonight at 48 hours        MDM: The below labs and imaging reports were reviewed and summarized above.  Medication management as above.    DVT prophylaxis:  apixaban (ELIQUIS) tablet 5 mg  Code Status: FULL Family Communication: called to legal guardian no ansewr           Subjective: No complaints.  No chest pain, headache, abdominal pain, dyspnea, fever, confusion.     Objective: Vitals:   07/25/21 0850 07/25/21 1143 07/25/21 1144 07/25/21 1546  BP: 116/80 122/86  124/87  Pulse: 64 61  (!) 45  Resp: Temp: 98 F (36.7 C) 98.4 F (36.9 C)  98 F (36.7 C)  TempSrc:    Oral  SpO2: 99% (!) 73% 99% 100%  Weight:      Height:        Intake/Output Summary (Last 24 hours) at 07/25/2021 1636 Last data filed at 07/25/2021 1408  Gross per 24 hour  Intake 1372.38 ml  Output 125 ml  Net 1247.38 ml   Filed Weights   07/22/21 0844  Weight: 99.8 kg    Examination: General appearance: Appears well, sitting up in bed, conversational     HEENT: Anicteric, conjunctive are pink, lids and lashes normal.  No nasal deformity, discharge, or epistaxis Skin: Oropharynx moist, no oral lesions, hearing normal Cardiac: Irregularly irregular, normal rate, no murmurs, no lower extremity edema Respiratory: Normal respiratory rate and rhythm, lungs clear without rales or wheezes Abdomen: Abdomen soft without tenderness palpation or guarding, no ascites  or distention MSK: No deformities or joint pains, full range of motion all joints. Neuro: Awake and alert, extraocular movements intact, moves upper extremities with generalized weakness, but Symmetric strength, speech fluent Psych: Attention normal, affect pleasant, judgment and insight appear impaired     Data Reviewed: I have personally reviewed following labs and imaging studies:  CBC: Recent Labs  Lab 07/22/21 0849 07/23/21 0550 07/24/21 0537 07/25/21 0526  WBC 6.5 5.6 6.4 5.5  NEUTROABS 5.0  --   --   --   HGB 11.4* 12.5* 10.9* 10.2*  HCT 34.6* 38.2* 33.5* 31.0*  MCV 103.3* 101.6* 101.8* 99.4  PLT 93* 87* 83* 88*   Basic Metabolic Panel: Recent Labs  Lab 07/22/21 0849 07/23/21 0550 07/24/21 0537 07/25/21 0526  NA 139 137 138 137  K 4.5 4.4 4.5 4.3  CL 103 105 103 102  CO2 31 27 30 29   GLUCOSE 96 89 91 113*  BUN 17 18 18 16   CREATININE 1.10 1.06 0.98 1.02  CALCIUM 8.7* 8.9 8.8* 8.7*   GFR: Estimated Creatinine Clearance: 88.3 mL/min (by C-G formula based on SCr of 1.02 mg/dL). Liver Function Tests: Recent Labs  Lab 07/22/21 0849  AST 23  ALT 14  ALKPHOS 54  BILITOT 0.6  PROT 6.9  ALBUMIN 3.5   No results for input(s): LIPASE, AMYLASE in the last 168 hours. No results for input(s): AMMONIA in the last 168 hours. Coagulation Profile: Recent Labs  Lab 07/22/21 0849 07/23/21 0550  INR 1.0 1.2   Cardiac Enzymes: No results for input(s): CKTOTAL, CKMB, CKMBINDEX, TROPONINI in the last 168 hours. BNP (last 3 results) No results for input(s): PROBNP in the last 8760 hours. HbA1C: No results for input(s): HGBA1C in the last 72 hours. CBG: Recent Labs  Lab 07/24/21 1627 07/24/21 2137 07/25/21 0851 07/25/21 1144 07/25/21 1550  GLUCAP 83 137* 80 106* 79   Lipid Profile: No results for input(s): CHOL, HDL, LDLCALC, TRIG, CHOLHDL, LDLDIRECT in the last 72 hours. Thyroid Function Tests: No results for input(s): TSH, T4TOTAL, FREET4, T3FREE,  THYROIDAB in the last 72 hours. Anemia Panel: No results for input(s): VITAMINB12, FOLATE, FERRITIN, TIBC, IRON, RETICCTPCT in the last 72 hours. Urine analysis:    Component Value Date/Time   COLORURINE YELLOW 07/22/2021 0930   APPEARANCEUR CLEAR 07/22/2021 0930   APPEARANCEUR Clear 03/14/2016 1027   LABSPEC 1.015 07/22/2021 0930   LABSPEC 1.017 01/29/2014 1545   PHURINE >9.0 (H) 07/22/2021 0930   GLUCOSEU NEGATIVE 07/22/2021 0930   GLUCOSEU Negative 01/29/2014 1545   HGBUR TRACE (A) 07/22/2021 0930   BILIRUBINUR NEGATIVE 07/22/2021 0930   BILIRUBINUR Negative 03/14/2016 1027   BILIRUBINUR Negative 01/29/2014 1545   KETONESUR NEGATIVE 07/22/2021 0930   PROTEINUR 30 (A) 07/22/2021 0930   NITRITE NEGATIVE 07/22/2021 0930   LEUKOCYTESUR NEGATIVE 07/22/2021 0930   LEUKOCYTESUR Negative 01/29/2014 1545   Sepsis Labs: @LABRCNTIP (procalcitonin:4,lacticacidven:4)  )  Recent Results (from the past 240 hour(s))  Resp Panel by RT-PCR (Flu A&B, Covid) Nasopharyngeal Swab     Status: None   Collection Time: 07/22/21  8:47 AM   Specimen: Nasopharyngeal Swab; Nasopharyngeal(NP) swabs in vial transport medium  Result Value Ref Range Status   SARS Coronavirus 2 by RT PCR NEGATIVE NEGATIVE Final    Comment: (NOTE) SARS-CoV-2 target nucleic acids are NOT DETECTED.  The SARS-CoV-2 RNA is generally detectable in upper respiratory specimens during the acute phase of infection. The lowest concentration of SARS-CoV-2 viral copies this assay can detect is 138 copies/mL. A negative result does not preclude SARS-Cov-2 infection and should not be used as the sole basis for treatment or other patient management decisions. A negative result may occur with  improper specimen collection/handling, submission of specimen other than nasopharyngeal swab, presence of viral mutation(s) within the areas targeted by this assay, and inadequate number of viral copies(<138 copies/mL). A negative result must be  combined with clinical observations, patient history, and epidemiological information. The expected result is Negative.  Fact Sheet for Patients:  BloggerCourse.com  Fact Sheet for Healthcare Providers:  SeriousBroker.it  This test is no t yet approved or cleared by the [place] FDA and  has been authorized for detection and/or diagnosis of SARS-CoV-2 by FDA under an Emergency Use Authorization (EUA). This EUA will remain  in effect (meaning this test can be used) for the duration of the COVID-19 declaration under Section 564(b)(1) of the Act, 21 U.S.C.section 360bbb-3(b)(1), unless the authorization is terminated  or revoked sooner.       Influenza A by PCR NEGATIVE NEGATIVE Final   Influenza B by PCR NEGATIVE NEGATIVE Final    Comment: (NOTE) The Xpert Xpress SARS-CoV-2/FLU/RSV plus assay is intended as an aid in the diagnosis of influenza from Nasopharyngeal swab specimens and should not be used as a sole basis for treatment. Nasal washings and aspirates are unacceptable for Xpert Xpress SARS-CoV-2/FLU/RSV testing.  Fact Sheet for Patients: BloggerCourse.com  Fact Sheet for Healthcare Providers: SeriousBroker.it  This test is not yet approved or cleared by the [place] FDA and has been authorized for detection and/or diagnosis of SARS-CoV-2 by FDA under an Emergency Use Authorization (EUA). This EUA will remain in effect (meaning this test can be used) for the duration of the COVID-19 declaration under Section 564(b)(1) of the Act, 21 U.S.C. section 360bbb-3(b)(1), unless the authorization is terminated or revoked.  Performed at [facility], [address], [place], [place] [address]   Urine Culture     Status: None   Collection Time: 07/22/21  9:30 AM   Specimen: Urine, Random  Result Value Ref Range Status   Specimen Description   Final     URINE, RANDOM Performed at [facility], [address], [place], [place] [address]    Special Requests   Final    NONE Performed at [facility], [address] [place], [place] [address]    Culture   Final    NO GROWTH Performed at [facility], [address] [place], [place] [address]    Report Status 07/23/2021 FINAL  Final  Blood Culture (routine x 2)     Status: Abnormal   Collection Time: 07/22/21 10:03 AM   Specimen: BLOOD  Result Value Ref Range Status   Specimen Description   Final    BLOOD BLOOD LEFT HAND Performed at [facility], [address] [place], [place] [address]  Special Requests   Final    BOTTLES DRAWN AEROBIC AND ANAEROBIC Blood Culture adequate volume Performed at [facility], [address] [place], [place] [address]    Culture  Setup Time   Final    GRAM POSITIVE COCCI IN BOTH AEROBIC AND ANAEROBIC BOTTLES CRITICAL RESULT CALLED TO, READ BACK BY AND VERIFIED WITH: JASON ROBBINS @ 2300 ON 07/22/2021 BY CAF    Culture (A)  Final    STREPTOCOCCUS GROUP G SUSCEPTIBILITIES PERFORMED ON PREVIOUS CULTURE WITHIN THE LAST 5 DAYS. Performed at [facility], [address], [place], [place] [address]    Report Status 07/25/2021 FINAL  Final  Blood Culture (routine x 2)     Status: Abnormal   Collection Time: 07/22/21 10:03 AM   Specimen: BLOOD  Result Value Ref Range Status   Specimen Description   Final    BLOOD RIGHT ANTECUBITAL Performed at [facility], [address] [place], [place] [address]    Special Requests   Final    BOTTLES DRAWN AEROBIC ONLY Blood Culture adequate volume Performed at [facility], [address] [place], [place] [address]    Culture  Setup Time   Final    GRAM POSITIVE COCCI AEROBIC BOTTLE ONLY CRITICAL RESULT CALLED TO, READ BACK BY AND VERIFIED WITH: JASON ROBBINS @ 2300 ON 07/22/2021 BY CAF Performed at [facility], [address], [place], [place] [address]    Culture STREPTOCOCCUS GROUP G (A)  Final   Report Status 07/25/2021 FINAL  Final   Organism ID, Bacteria STREPTOCOCCUS GROUP G  Final      Susceptibility   Streptococcus group g - MIC*    CLINDAMYCIN RESISTANT Resistant     AMPICILLIN <=0.25 SENSITIVE Sensitive     ERYTHROMYCIN >=8 RESISTANT Resistant     VANCOMYCIN 0.5 SENSITIVE Sensitive     CEFTRIAXONE <=0.12 SENSITIVE Sensitive     LEVOFLOXACIN 0.5 SENSITIVE Sensitive     * STREPTOCOCCUS GROUP G  Blood Culture ID Panel (Reflexed)     Status: Abnormal   Collection Time: 07/22/21 10:03 AM  Result Value Ref Range Status   Enterococcus faecalis NOT DETECTED NOT DETECTED Final   Enterococcus Faecium NOT DETECTED NOT DETECTED Final   Listeria monocytogenes NOT DETECTED NOT DETECTED Final   Staphylococcus species NOT DETECTED NOT DETECTED Final   Staphylococcus aureus (BCID) NOT DETECTED NOT DETECTED Final   Staphylococcus epidermidis NOT DETECTED NOT DETECTED Final   Staphylococcus lugdunensis NOT DETECTED NOT DETECTED Final   Streptococcus species DETECTED (A) NOT DETECTED Final    Comment: Not Enterococcus species, Streptococcus agalactiae, Streptococcus pyogenes, or Streptococcus pneumoniae. CRITICAL RESULT CALLED TO, READ BACK BY AND VERIFIED WITH: JASON ROBBINS @ 2300 ON 07/22/2021 BY CAF    Streptococcus agalactiae NOT DETECTED NOT DETECTED Final   Streptococcus pneumoniae NOT DETECTED NOT DETECTED Final   Streptococcus pyogenes NOT DETECTED NOT DETECTED Final   A.calcoaceticus-baumannii NOT DETECTED NOT DETECTED Final   Bacteroides fragilis NOT DETECTED NOT DETECTED Final   Enterobacterales NOT DETECTED NOT DETECTED Final   Enterobacter cloacae complex NOT DETECTED NOT DETECTED Final   Escherichia coli NOT DETECTED NOT DETECTED Final   Klebsiella aerogenes NOT DETECTED NOT DETECTED Final   Klebsiella oxytoca NOT DETECTED NOT DETECTED Final   Klebsiella pneumoniae NOT DETECTED NOT DETECTED  Final   Proteus species NOT DETECTED NOT DETECTED Final   Salmonella species NOT DETECTED NOT DETECTED Final   Serratia marcescens NOT DETECTED NOT  DETECTED Final   Haemophilus influenzae NOT DETECTED NOT DETECTED Final   Neisseria meningitidis NOT DETECTED NOT DETECTED Final   Pseudomonas aeruginosa NOT DETECTED NOT DETECTED Final   Stenotrophomonas maltophilia NOT DETECTED NOT DETECTED Final   Candida albicans NOT DETECTED NOT DETECTED Final   Candida auris NOT DETECTED NOT DETECTED Final   Candida glabrata NOT DETECTED NOT DETECTED Final   Candida krusei NOT DETECTED NOT DETECTED Final   Candida parapsilosis NOT DETECTED NOT DETECTED Final   Candida tropicalis NOT DETECTED NOT DETECTED Final   Cryptococcus neoformans/gattii NOT DETECTED NOT DETECTED Final    Comment: Performed at [facility], [address] [place], [place] [address]  MRSA Next Gen by PCR, Nasal     Status: None   Collection Time: 07/22/21  7:25 PM   Specimen: Nasal Mucosa; Nasal Swab  Result Value Ref Range Status   MRSA by PCR Next Gen NOT DETECTED NOT DETECTED Final    Comment: (NOTE) The GeneXpert MRSA Assay (FDA approved for NASAL specimens only), is one component of a comprehensive MRSA colonization surveillance program. It is not intended to diagnose MRSA infection nor to guide or monitor treatment for MRSA infections. Test performance is not FDA approved in patients less than 79 years old. Performed at [facility], [address] [place], [place] [address]   CULTURE, BLOOD (ROUTINE X 2) w Reflex to ID Panel     Status: None (Preliminary result)   Collection Time: 07/23/21 10:41 PM   Specimen: BLOOD LEFT HAND  Result Value Ref Range Status   Specimen Description BLOOD LEFT HAND  Final   Special Requests   Final    BOTTLES DRAWN AEROBIC AND ANAEROBIC Blood Culture adequate volume   Culture   Final    NO GROWTH 2 DAYS Performed at [facility], [address],  [place], [place] [address]    Report Status PENDING  Incomplete  CULTURE, BLOOD (ROUTINE X 2) w Reflex to ID Panel     Status: None (Preliminary result)   Collection Time: 07/23/21 10:41 PM   Specimen: BLOOD RIGHT HAND  Result Value Ref Range Status   Specimen Description BLOOD RIGHT HAND  Final   Special Requests   Final    BOTTLES DRAWN AEROBIC AND ANAEROBIC Blood Culture adequate volume   Culture   Final    NO GROWTH 2 DAYS Performed at [facility], [address], [place], [place] [address]    Report Status PENDING  Incomplete         Radiology Studies: No results found.      Scheduled Meds:  apixaban  5 mg Oral BID   aspirin EC  81 mg Oral Daily   atorvastatin  10 mg Oral Daily   benztropine  1 mg Oral QHS   brimonidine  1 drop Both Eyes BID   And   timolol  1 drop Both Eyes BID   carvedilol  6.25 mg Oral BID WC   citalopram  20 mg Oral Daily   divalproex  2,000 mg Oral QHS   donepezil  10 mg Oral QHS   insulin aspart  0-9 Units Subcutaneous TID WC   latanoprost  1 drop Both Eyes QHS   losartan  25 mg Oral Daily   pantoprazole  40 mg Oral Daily   risperidone  4 mg Oral QHS   sodium chloride flush  3 mL Intravenous Q12H   Continuous Infusions:  cefTRIAXone (ROCEPHIN)  IV Stopped (07/25/21  0235)     LOS: 3 days    Time spent: 25 minutes    Alberteen Sam, MD [facility] 07/25/2021, 4:36 PM     Please page though AMION or Epic secure chat:  For [employer], Higher education careers adviser

## 2021-07-25 NOTE — TOC Progression Note (Signed)
Transition of Care [facility]) - Progression Note    Patient Details  Name: Brian Dodson MRN: [id number] Date of Birth: [birth year]  Transition [facility] CM/SW Contact  Caryn Section, RN Phone Number: 07/25/2021, 3:41 PM  Clinical Narrative:   Patient lives at [facility].  Has IV antibiotics orders at this time.  Tammy from [facility] states they cannot accept patient to their facility with IV antibiotics.           Expected Discharge Plan and Services                                                 Social Determinants of Health (SDOH) Interventions    Readmission Risk Interventions No flowsheet data found.

## 2021-07-25 NOTE — Progress Notes (Addendum)
PHARMACY CONSULT NOTE FOR:  OUTPATIENT  PARENTERAL ANTIBIOTIC THERAPY (OPAT)  ADDENDUM: Patient's group home cannot accept IV antibiotics so plan for below has been cancelled.   Juliette Alcide, PharmD, BCPS.   Work Cell: [phone] 07/25/2021 4:11 PM    Indication: Group G streptococcus bacteremia and cellulitis Regimen: ceftriaxone 2gm IV q24h End date: 08/04/2021  IV antibiotic discharge orders are pended. To discharging provider:  please sign these orders via discharge navigator,  Select New Orders & click on the button choice - Manage This Unsigned Work.     Thank you for allowing pharmacy to be a part of this patient's care.  Juliette Alcide, PharmD, BCPS.   Work Cell: [phone] 07/25/2021 1:11 PM

## 2021-07-26 DIAGNOSIS — F0150 Vascular dementia without behavioral disturbance: Secondary | ICD-10-CM

## 2021-07-26 DIAGNOSIS — I4891 Unspecified atrial fibrillation: Secondary | ICD-10-CM

## 2021-07-26 LAB — GLUCOSE, CAPILLARY
Glucose-Capillary: 113 mg/dL — ABNORMAL HIGH (ref 70–99)
Glucose-Capillary: 115 mg/dL — ABNORMAL HIGH (ref 70–99)

## 2021-07-26 MED ORDER — AMOXICILLIN 500 MG PO CAPS: 1000.0000 mg | ORAL_CAPSULE | Freq: Three times a day (TID) | ORAL | 0 refills | Status: AC

## 2021-07-26 MED ORDER — AMIODARONE HCL 200 MG PO TABS: 200.0000 mg | ORAL_TABLET | Freq: Every day | ORAL | Status: DC

## 2021-07-26 NOTE — Progress Notes (Signed)
Patient discharged in stable condition. Discharge packet given to Tammy from ALF at pick up. Verbalized understanding of all d/c instructions including abx

## 2021-07-26 NOTE — Discharge Summary (Signed)
Physician Discharge Summary  DAMIANO STAMPER BEE:[id number] DOB: 12-Mar-1956 DOA: 07/22/2021  PCP: [name], MD  Admit date: 07/22/2021 Discharge date: 07/26/2021  Admitted From: home  Disposition: home   Recommendations for Outpatient Follow-up:  Follow up with PCP in 1-2 weeks   Home Health:  Equipment/Devices:  Discharge Condition: stasble  CODE STATUS: full  Diet recommendation: Heart Healthy / Carb Modified  Brief/Interim Summary: HPI was taken from Dr. Dione Plover: Lowanda Foster is a 65 y.o. male with history of schizophrenia living long-term in a nursing home, CAD, A. fib, hypertension, type 2 diabetes, dementia, who presents with fever from his nursing home.   Patient seen and examined at bedside with his niece present, she reports she is the only family member who regularly visited him.  She reports he has seemed off for about 2 weeks.  Today staff at his nursing home found him shaking with chills and seeming to be less responsive and not quite like himself.  EMS was called and found to have a temperature of 102.3 axillary as well as tachycardia, he was transported to the [facility] ED.   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  He loves to sing gospel music and go outside to look for for [employer].  She reports that he is definitely not himself.  On interview patient endorses occasional back pain but denies cough, chest pain, shortness of breath, abdominal pain, burning or pain with urination, or neck pain.   In the ED initial vital signs notable for temperature to 103.2 Fahrenheit oral, tachycardia to the low 100s, tachypnea in the low 20s, and normal blood pressures.  Lab work-up showed a CBC with normal white count, mild anemia at his baseline, and thrombocytopenia down to 93, his baseline is usually in the 130s to 140s.  Lactic acid borderline elevated at 2.0 on arrival, down trended to 1.3; CMP, troponin,  procalcitonin were all normal.  INR and PTT were normal.  Depakote level was normal.  Test for COVID and influenza were negative.  UA notable only for a very high pH greater than 9, otherwise normal.  Chest x-ray did not show any evidence of infiltrates.  CT head without contrast showed no acute findings.  CT abdomen and pelvis showed small amount of bilateral perinephric fluid but no other acute findings.  EKG showed A. fib, no significant change from priors.  Blood and urine cultures were collected, he was given Tylenol, 3 L LR bolus, and started on cefepime, Flagyl, and vancomycin.   As per Dr. Loleta Books: Mr. Caradine is a 65 y.o. M with schizophrenia, dementia living long-term in a nursing home, CAD, A. fib, hypertension, type 2 diabetes who presented with fever from his nursing home.   Staff at [facility] found patient with shaking chills, and less responsive than baseline.   In the ER, CXR clear, UA unremarkable.  Started on empiric antibioics and cultures drawn.  Overnight blood cultures growing staph lugdunensis in 3/4 bottles    Hospital course from Dr. Jimmye Norman 07/26/21: Repeat blood cxs show NGTD. Pt was switched to po amoxicillin 1027m TID x 7 days as per ID. Pt was d/c back to his home facility  Discharge Diagnoses:  Principal Problem:   Sepsis ([place] Active Problems:   Atrial fibrillation with RVR ([place]   Schizophrenia ([place]   Diabetes mellitus, type II ([place]   HTN (hypertension)   CAD (coronary artery disease)  Loss of memory   Dementia ([place]) Severe sepsis: secondary to group G strep bacteremia. Met criteria w/ fever, tachycardia, & bacteremia. Switched to amoxicillin at d/c. Echo did not show any endocarditis, TEE deferred.   Bacteremia: w/ group G strep species. Switched to po amoxicillin at d/c as per ID   Acute metabolic encephalopathy: superimposed on dementia. Continue on home dose of donepezil   Thrombocytopenia: etiology unclear, possibly secondary to sepsis    PAF: continue  on home dose of carvedilol, amiodarone, eliquis   CAD: continue on home dose of carvedilol, losartan, statin & aspirin   HTN: continue on losartan, carevdilol   Schizophrenia: unknown type or severity. Continue on home dose of benztropine, risperidone, depakote  Depression: unknown severity. Continue on home dose of citalopram    DM2: likely poorly controlled. Continue on SSI w/ accuchecks    Discharge Instructions  Discharge Instructions     Diet - low sodium heart healthy   Complete by: As directed    Diet Carb Modified   Complete by: As directed    Discharge instructions   Complete by: As directed    F/u w/ PCP in 1-2 weeks.   Increase activity slowly   Complete by: As directed       Allergies as of 07/26/2021       Reactions   Xarelto [rivaroxaban]         Medication List     TAKE these medications    acetaminophen 500 MG tablet Commonly known as: TYLENOL Take 1,000 mg by mouth every 6 (six) hours as needed for mild pain.   amiodarone 200 MG tablet Commonly known as: PACERONE Take 0.5 tablets (100 mg total) by mouth daily. What changed: how much to take   amLODipine 5 MG tablet Commonly known as: NORVASC Take 5 mg by mouth daily.   amoxicillin 500 MG capsule Commonly known as: AMOXIL Take 2 capsules (1,000 mg total) by mouth 3 (three) times daily for 7 days.   aspirin 81 MG tablet Take 81 mg by mouth daily.   atorvastatin 10 MG tablet Commonly known as: LIPITOR Take 10 mg by mouth daily.   benztropine 1 MG tablet Commonly known as: COGENTIN Take 1 mg by mouth at bedtime.   Biofreeze 4 % Gel Generic drug: Menthol (Topical Analgesic) Apply 1 application topically 3 (three) times daily. To lower back area as needed for pain   brimonidine-timolol 0.2-0.5 % ophthalmic solution Commonly known as: COMBIGAN Place 1 drop into both eyes every 12 (twelve) hours. Wait 3 to 5 minutes between drops   carvedilol 6.25 MG tablet Commonly known as:  COREG Take 6.25 mg by mouth 2 (two) times daily with a meal.   citalopram 20 MG tablet Commonly known as: CELEXA Take 20 mg by mouth daily.   diclofenac Sodium 1 % Gel Commonly known as: VOLTAREN Apply 2 g topically 4 (four) times daily. Apply to neck and shoulders   divalproex 500 MG 24 hr tablet Commonly known as: DEPAKOTE ER Take 2,000 mg by mouth at bedtime.   donepezil 10 MG tablet Commonly known as: ARICEPT Take 10 mg by mouth at bedtime.   Eliquis 5 MG Tabs tablet Generic drug: apixaban Take 5 mg by mouth 2 (two) times daily.   EQL Vitamin D3 50 MCG (2000 UT) Caps Generic drug: Cholecalciferol Take 1 capsule by mouth daily.   ferrous sulfate 325 (65 FE) MG tablet Take 325 mg by mouth daily with breakfast.   furosemide 20  MG tablet Commonly known as: LASIX Take 20 mg by mouth daily as needed. For edema (swelling)   lanolin/mineral oil Lotn Apply 1 application topically as needed for dry skin. Apply to face after washing face daily   latanoprost 0.005 % ophthalmic solution Commonly known as: XALATAN Place 1 drop into both eyes at bedtime.   losartan 25 MG tablet Commonly known as: Cozaar Take 1 tablet (25 mg total) by mouth daily. May increase to 2 tablets by mouth daily if blood pressure goes above 140/90 for more than a day  or if the blood pressure goes above 150/100 at all. What changed: additional instructions   Lumigan 0.01 % Soln Generic drug: bimatoprost Place 1 drop into both eyes at bedtime.   melatonin 5 MG Tabs Take 5 mg by mouth at bedtime.   metFORMIN 500 MG tablet Commonly known as: GLUCOPHAGE Take 500 mg by mouth 2 (two) times daily with a meal.   omeprazole 20 MG capsule Commonly known as: PRILOSEC Take 20 mg by mouth 2 (two) times daily before a meal. May open and sprinkle in applesauce.   oyster calcium 500 MG Tabs tablet Take 500 mg of elemental calcium by mouth 2 (two) times daily.   risperidone 4 MG tablet Commonly known as:  RISPERDAL Take 4 mg by mouth at bedtime.   vitamin C 250 MG tablet Commonly known as: ASCORBIC ACID Take 1 tablet (250 mg total) by mouth daily.        Allergies  Allergen Reactions   Xarelto [Rivaroxaban]     Consultations:    Procedures/Studies: CT Head Wo Contrast  Result Date: 07/22/2021 CLINICAL DATA:  Mental status change, atrial fibrillation, dementia EXAM: CT HEAD WITHOUT CONTRAST TECHNIQUE: Contiguous axial images were obtained from the base of the skull through the vertex without intravenous contrast. COMPARISON:  03/02/2021 FINDINGS: Brain: Atrophy and white matter microvascular ischemic changes throughout both cerebral hemispheres. No acute intracranial hemorrhage, new mass lesion, definite infarction, midline shift, herniation, hydrocephalus, or extra-axial fluid collection. No focal mass effect or edema. Cisterns are patent. Cerebellar atrophy as well. Vascular: No hyperdense vessel or unexpected calcification. Skull: Normal. Negative for fracture or focal lesion. Sinuses/Orbits: No acute finding. Other: None. IMPRESSION: Chronic atrophy and white matter microvascular ischemic changes. No acute intracranial abnormality by noncontrast CT. Electronically Signed   By: Jerilynn Mages.  Shick M.D.   On: 07/22/2021 12:09   CT Abdomen Pelvis W Contrast  Result Date: 07/22/2021 CLINICAL DATA:  Evaluate for abdominal abscess or infection. EXAM: CT ABDOMEN AND PELVIS WITH CONTRAST TECHNIQUE: Multidetector CT imaging of the abdomen and pelvis was performed using the standard protocol following bolus administration of intravenous contrast. CONTRAST:  74m OMNIPAQUE IOHEXOL 350 MG/ML SOLN COMPARISON:  CT abdomen pelvis 11/21/2005. FINDINGS: Lower chest: Normal heart size. Dependent atelectasis within the bilateral lower lobes. Hepatobiliary: Streak artifact limits evaluation. Liver is normal in size and contour. Gallbladder is unremarkable. Pancreas: Unremarkable Spleen: Unremarkable Adrenals/Urinary  Tract: Normal adrenal glands. Kidneys enhance symmetrically with contrast. Small amount of perinephric fluid bilaterally. No hydronephrosis. Urinary bladder is unremarkable. Stomach/Bowel: Stool throughout the colon. No abnormal bowel wall thickening or evidence for bowel obstruction. No free fluid or free intraperitoneal air. Normal appendix. Normal morphology of the stomach. Vascular/Lymphatic: Normal caliber abdominal aorta. No retroperitoneal lymphadenopathy. Reproductive: Heterogeneous prostate. Other: None. Musculoskeletal: Lower thoracic and lumbar spine degenerative changes. No aggressive or acute appearing osseous lesions. IMPRESSION: Limited exam due to streak artifact. Small amount of perinephric fluid bilaterally. Recommend correlation with  urinalysis to exclude urinary tract infection. Otherwise no acute process within the abdomen or pelvis. Electronically Signed   By: Lovey Newcomer M.D.   On: 07/22/2021 12:13   US RENAL  Result Date: 07/22/2021 CLINICAL DATA:  Abnormal urologic system. EXAM: RENAL / URINARY TRACT ULTRASOUND COMPLETE COMPARISON:  CT abdomen pelvis dated 07/22/2021 and renal ultrasound dated 01/30/2017. FINDINGS: Right Kidney: Renal measurements: 11.6 x 6.1 x 5.4 cm = volume: 200 mL. Echogenicity is increased. No mass or hydronephrosis visualized. Left Kidney: Renal measurements: 10.1 x 6.1 x 5.8 cm = volume: 187 mL. Echogenicity is increased. No mass or hydronephrosis visualized. Bladder: Appears normal for degree of bladder distention. Other: The exam is limited due to patient body habitus. IMPRESSION: No hydronephrosis. Increased renal cortical echogenicity is consistent with chronic medical renal disease. Electronically Signed   By: Zerita Boers M.D.   On: 07/22/2021 15:23   DG Chest Port 1 View  Result Date: 07/22/2021 CLINICAL DATA:  Altered mental status, unresponsive, fever, sepsis evaluation EXAM: PORTABLE CHEST 1 VIEW COMPARISON:  03/02/2021 FINDINGS: Low lung volumes with  vascular prominence and basilar atelectasis. Heart is enlarged. No focal pneumonia, acute airspace process, effusion, or edema. No pneumothorax. Trachea midline. Degenerative changes of the spine. IMPRESSION: Low volume exam with cardiomegaly and basilar atelectasis. Electronically Signed   By: Jerilynn Mages.  Shick M.D.   On: 07/22/2021 10:35   ECHOCARDIOGRAM COMPLETE  Result Date: 07/24/2021    ECHOCARDIOGRAM REPORT   Patient Name:   ESCHOL AUXIER Date of Exam: 07/23/2021 Medical Rec #:  [id number]     Height:       72.0 in Accession #:    [id number]    Weight:       220.0 lb Date of Birth:  Aug 29, 1956    BSA:          2.219 m Patient Age:    24 years      BP:           138/96 mmHg Patient Gender: M             HR:           47 bpm. Exam Location:  [facility] Procedure: 2D Echo Indications:     Bactermia R78.81  History:         Patient has prior history of Echocardiogram examinations, most                  recent 03/03/2021.  Sonographer:     Kathlen Brunswick RDCS Referring Phys:  [id number] Suann Larry DANFORD Diagnosing Phys: Serafina Royals MD IMPRESSIONS  1. Left ventricular ejection fraction, by estimation, is 60 to 65%. The left ventricle has normal function. The left ventricle has no regional wall motion abnormalities. Left ventricular diastolic parameters were normal.  2. Right ventricular systolic function is normal. The right ventricular size is normal.  3. The mitral valve is normal in structure. Trivial mitral valve regurgitation.  4. The aortic valve is normal in structure. Aortic valve regurgitation is trivial. FINDINGS  Left Ventricle: Left ventricular ejection fraction, by estimation, is 60 to 65%. The left ventricle has normal function. The left ventricle has no regional wall motion abnormalities. The left ventricular internal cavity size was small. There is no left ventricular hypertrophy. Left ventricular diastolic parameters were normal. Right Ventricle: The right ventricular size is normal. No increase in right  ventricular wall thickness. Right ventricular systolic function is normal. Left Atrium: Left atrial size was normal in size. Right  Atrium: Right atrial size was normal in size. Pericardium: There is no evidence of pericardial effusion. Mitral Valve: The mitral valve is normal in structure. Trivial mitral valve regurgitation. Tricuspid Valve: The tricuspid valve is normal in structure. Tricuspid valve regurgitation is trivial. Aortic Valve: The aortic valve is normal in structure. Aortic valve regurgitation is trivial. Aortic valve peak gradient measures 3.5 mmHg. Pulmonic Valve: The pulmonic valve was normal in structure. Pulmonic valve regurgitation is not visualized. Aorta: The aortic root and ascending aorta are structurally normal, with no evidence of dilitation. IAS/Shunts: No atrial level shunt detected by color flow Doppler.  LEFT VENTRICLE PLAX 2D LVIDd:         4.55 cm  Diastology LVIDs:         3.10 cm  LV e' medial:    9.14 cm/s LV PW:         1.10 cm  LV E/e' medial:  8.5 LV IVS:        1.35 cm  LV e' lateral:   15.90 cm/s LVOT diam:     2.60 cm  LV E/e' lateral: 4.9 LV SV:         65 LV SV Index:   29 LVOT Area:     5.31 cm  RIGHT VENTRICLE RV Basal diam:  4.00 cm RV S prime:     14.90 cm/s TAPSE (M-mode): 2.7 cm LEFT ATRIUM             Index       RIGHT ATRIUM           Index LA diam:        4.20 cm 1.89 cm/m  RA Area:     24.80 cm LA Vol (A2C):   53.2 ml 23.98 ml/m RA Volume:   97.60 ml  43.99 ml/m LA Vol (A4C):   32.1 ml 14.47 ml/m LA Biplane Vol: 42.4 ml 19.11 ml/m  AORTIC VALVE                PULMONIC VALVE AV Area (Vmax): 3.70 cm    PV Vmax:       0.99 m/s AV Vmax:        93.70 cm/s  PV Peak grad:  3.9 mmHg AV Peak Grad:   3.5 mmHg LVOT Vmax:      65.30 cm/s LVOT Vmean:     46.700 cm/s LVOT VTI:       0.122 m  AORTA Ao Root diam: 3.50 cm Ao Asc diam:  3.20 cm MITRAL VALVE MV Area (PHT): 2.76 cm    SHUNTS MV Decel Time: 275 msec    Systemic VTI:  0.12 m MV E velocity: 78.00 cm/s  Systemic  Diam: 2.60 cm Serafina Royals MD Electronically signed by Serafina Royals MD Signature Date/Time: 07/24/2021/6:34:31 AM    Final    (Echo, Carotid, EGD, Colonoscopy, ERCP)    Subjective: Pt denies any complaints    Discharge Exam: Vitals:   07/26/21 0558 07/26/21 0846  BP: 109/62 110/70  Pulse: (!) 51 60  Resp: 18 18  Temp: 97.8 F (36.6 C) 98 F (36.7 C)  SpO2: 99% 99%   Vitals:   07/25/21 2027 07/25/21 2351 07/26/21 0558 07/26/21 0846  BP: (!) 101/53 107/84 109/62 110/70  Pulse: (!) 50 (!) 54 (!) 51 60  Resp: _0 Temp: 97.6 F (36.4 C) 98.8 F (37.1 C) 97.8 F (36.6 C) 98 F (36.7 C)  TempSrc:    Oral  SpO2:  97% 98% 99% 99%  Weight:      Height:        General: Pt is alert, awake, not in acute distress Cardiovascular: S1/S2 +, no rubs, no gallops Respiratory: CTA bilaterally, no wheezing, no rhonchi Abdominal: Soft, NT, ND, bowel sounds + Extremities: no cyanosis    The results of significant diagnostics from this hospitalization (including imaging, microbiology, ancillary and laboratory) are listed below for reference.     Microbiology: Recent Results (from the past 240 hour(s))  Resp Panel by RT-PCR (Flu A&B, Covid) Nasopharyngeal Swab     Status: None   Collection Time: 07/22/21  8:47 AM   Specimen: Nasopharyngeal Swab; Nasopharyngeal(NP) swabs in vial transport medium  Result Value Ref Range Status   SARS Coronavirus 2 by RT PCR NEGATIVE NEGATIVE Final    Comment: (NOTE) SARS-CoV-2 target nucleic acids are NOT DETECTED.  The SARS-CoV-2 RNA is generally detectable in upper respiratory specimens during the acute phase of infection. The lowest concentration of SARS-CoV-2 viral copies this assay can detect is 138 copies/mL. A negative result does not preclude SARS-Cov-2 infection and should not be used as the sole basis for treatment or other patient management decisions. A negative result may occur with  improper specimen collection/handling,  submission of specimen other than nasopharyngeal swab, presence of viral mutation(s) within the areas targeted by this assay, and inadequate number of viral copies(<138 copies/mL). A negative result must be combined with clinical observations, patient history, and epidemiological information. The expected result is Negative.  Fact Sheet for Patients:  EntrepreneurPulse.com.au  Fact Sheet for Healthcare Providers:  IncredibleEmployment.be  This test is no t yet approved or cleared by the [place] FDA and  has been authorized for detection and/or diagnosis of SARS-CoV-2 by FDA under an Emergency Use Authorization (EUA). This EUA will remain  in effect (meaning this test can be used) for the duration of the COVID-19 declaration under Section 564(b)(1) of the Act, 21 U.S.C.section 360bbb-3(b)(1), unless the authorization is terminated  or revoked sooner.       Influenza A by PCR NEGATIVE NEGATIVE Final   Influenza B by PCR NEGATIVE NEGATIVE Final    Comment: (NOTE) The Xpert Xpress SARS-CoV-2/FLU/RSV plus assay is intended as an aid in the diagnosis of influenza from Nasopharyngeal swab specimens and should not be used as a sole basis for treatment. Nasal washings and aspirates are unacceptable for Xpert Xpress SARS-CoV-2/FLU/RSV testing.  Fact Sheet for Patients: EntrepreneurPulse.com.au  Fact Sheet for Healthcare Providers: IncredibleEmployment.be  This test is not yet approved or cleared by the [place] FDA and has been authorized for detection and/or diagnosis of SARS-CoV-2 by FDA under an Emergency Use Authorization (EUA). This EUA will remain in effect (meaning this test can be used) for the duration of the COVID-19 declaration under Section 564(b)(1) of the Act, 21 U.S.C. section 360bbb-3(b)(1), unless the authorization is terminated or revoked.  Performed at [facility], [address], [place], [place] [address]   Urine Culture     Status: None   Collection Time: 07/22/21  9:30 AM   Specimen: Urine, Random  Result Value Ref Range Status   Specimen Description   Final    URINE, RANDOM Performed at [facility], [address] [place], [place] [address]    Special Requests   Final    NONE Performed at [facility], [address], [place], [place] [address]    Culture   Final    NO GROWTH  Performed at [facility], [place] [address], [place], [place] [address]    Report Status 07/23/2021 FINAL  Final  Blood Culture (routine x 2)     Status: Abnormal   Collection Time: 07/22/21 10:03 AM   Specimen: BLOOD  Result Value Ref Range Status   Specimen Description   Final    BLOOD BLOOD LEFT HAND Performed at [facility], [address], [place], [place] [address]    Special Requests   Final    BOTTLES DRAWN AEROBIC AND ANAEROBIC Blood Culture adequate volume Performed at [facility], [address], [place], [place] [address]    Culture  Setup Time   Final    GRAM POSITIVE COCCI IN BOTH AEROBIC AND ANAEROBIC BOTTLES CRITICAL RESULT CALLED TO, READ BACK BY AND VERIFIED WITH: JASON ROBBINS @ 2300 ON 07/22/2021 BY CAF    Culture (A)  Final    STREPTOCOCCUS GROUP G SUSCEPTIBILITIES PERFORMED ON PREVIOUS CULTURE WITHIN THE LAST 5 DAYS. Performed at [facility], [place] [address], [place], [place] [address]    Report Status 07/25/2021 FINAL  Final  Blood Culture (routine x 2)     Status: Abnormal   Collection Time: 07/22/21 10:03 AM   Specimen: BLOOD  Result Value Ref Range Status   Specimen Description   Final    BLOOD RIGHT ANTECUBITAL Performed at [facility], [address], [place], [place] [address]    Special Requests   Final    BOTTLES DRAWN AEROBIC ONLY Blood Culture adequate volume Performed at [place], [place] [address]    Culture  Setup  Time   Final    GRAM POSITIVE COCCI AEROBIC BOTTLE ONLY CRITICAL RESULT CALLED TO, READ BACK BY AND VERIFIED WITH: JASON ROBBINS @ 2300 ON 07/22/2021 BY CAF Performed at [facility], [place] [address], [place], [place] [address]    Culture STREPTOCOCCUS GROUP G (A)  Final   Report Status 07/25/2021 FINAL  Final   Organism ID, Bacteria STREPTOCOCCUS GROUP G  Final      Susceptibility   Streptococcus group g - MIC*    CLINDAMYCIN RESISTANT Resistant     AMPICILLIN <=0.25 SENSITIVE Sensitive     ERYTHROMYCIN >=8 RESISTANT Resistant     VANCOMYCIN 0.5 SENSITIVE Sensitive     CEFTRIAXONE <=0.12 SENSITIVE Sensitive     LEVOFLOXACIN 0.5 SENSITIVE Sensitive     * STREPTOCOCCUS GROUP G  Blood Culture ID Panel (Reflexed)     Status: Abnormal   Collection Time: 07/22/21 10:03 AM  Result Value Ref Range Status   Enterococcus faecalis NOT DETECTED NOT DETECTED Final   Enterococcus Faecium NOT DETECTED NOT DETECTED Final   Listeria monocytogenes NOT DETECTED NOT DETECTED Final   Staphylococcus species NOT DETECTED NOT DETECTED Final   Staphylococcus aureus (BCID) NOT DETECTED NOT DETECTED Final   Staphylococcus epidermidis NOT DETECTED NOT DETECTED Final   Staphylococcus lugdunensis NOT DETECTED NOT DETECTED Final   Streptococcus species DETECTED (A) NOT DETECTED Final    Comment: Not Enterococcus species, Streptococcus agalactiae, Streptococcus pyogenes, or Streptococcus pneumoniae. CRITICAL RESULT CALLED TO, READ BACK BY AND VERIFIED WITH: JASON ROBBINS @ 2300 ON 07/22/2021 BY CAF    Streptococcus agalactiae NOT DETECTED NOT DETECTED Final   Streptococcus pneumoniae NOT DETECTED NOT DETECTED Final   Streptococcus pyogenes NOT DETECTED NOT DETECTED Final   A.calcoaceticus-baumannii NOT DETECTED NOT DETECTED Final   Bacteroides fragilis NOT DETECTED NOT DETECTED Final  Enterobacterales NOT DETECTED NOT DETECTED Final   Enterobacter cloacae complex NOT DETECTED NOT DETECTED Final    Escherichia coli NOT DETECTED NOT DETECTED Final   Klebsiella aerogenes NOT DETECTED NOT DETECTED Final   Klebsiella oxytoca NOT DETECTED NOT DETECTED Final   Klebsiella pneumoniae NOT DETECTED NOT DETECTED Final   Proteus species NOT DETECTED NOT DETECTED Final   Salmonella species NOT DETECTED NOT DETECTED Final   Serratia marcescens NOT DETECTED NOT DETECTED Final   Haemophilus influenzae NOT DETECTED NOT DETECTED Final   Neisseria meningitidis NOT DETECTED NOT DETECTED Final   Pseudomonas aeruginosa NOT DETECTED NOT DETECTED Final   Stenotrophomonas maltophilia NOT DETECTED NOT DETECTED Final   Candida albicans NOT DETECTED NOT DETECTED Final   Candida auris NOT DETECTED NOT DETECTED Final   Candida glabrata NOT DETECTED NOT DETECTED Final   Candida krusei NOT DETECTED NOT DETECTED Final   Candida parapsilosis NOT DETECTED NOT DETECTED Final   Candida tropicalis NOT DETECTED NOT DETECTED Final   Cryptococcus neoformans/gattii NOT DETECTED NOT DETECTED Final    Comment: Performed at [facility], [address] [place], [place] [address]  MRSA Next Gen by PCR, Nasal     Status: None   Collection Time: 07/22/21  7:25 PM   Specimen: Nasal Mucosa; Nasal Swab  Result Value Ref Range Status   MRSA by PCR Next Gen NOT DETECTED NOT DETECTED Final    Comment: (NOTE) The GeneXpert MRSA Assay (FDA approved for NASAL specimens only), is one component of a comprehensive MRSA colonization surveillance program. It is not intended to diagnose MRSA infection nor to guide or monitor treatment for MRSA infections. Test performance is not FDA approved in patients less than 3 years old. Performed at [facility], [address] [place], [place] [address]   CULTURE, BLOOD (ROUTINE X 2) w Reflex to ID Panel     Status: None (Preliminary result)   Collection Time: 07/23/21 10:41 PM   Specimen: BLOOD LEFT HAND  Result Value Ref Range Status   Specimen Description BLOOD LEFT  HAND  Final   Special Requests   Final    BOTTLES DRAWN AEROBIC AND ANAEROBIC Blood Culture adequate volume   Culture   Final    NO GROWTH 3 DAYS Performed at [facility], [address], [place], [place] [address]    Report Status PENDING  Incomplete  CULTURE, BLOOD (ROUTINE X 2) w Reflex to ID Panel     Status: None (Preliminary result)   Collection Time: 07/23/21 10:41 PM   Specimen: BLOOD RIGHT HAND  Result Value Ref Range Status   Specimen Description BLOOD RIGHT HAND  Final   Special Requests   Final    BOTTLES DRAWN AEROBIC AND ANAEROBIC Blood Culture adequate volume   Culture   Final    NO GROWTH 3 DAYS Performed at [facility], [address], [place], [place] [address]    Report Status PENDING  Incomplete     Labs: BNP (last 3 results) No results for input(s): BNP in the last 8760 hours. Basic Metabolic Panel: Recent Labs  Lab 07/22/21 0849 07/23/21 0550 07/24/21 0537 07/25/21 0526  NA 139 137 138 137  K 4.5 4.4 4.5 4.3  CL 103 105 103 102  CO2 _0 GLUCOSE 96 89 91 113*  BUN _1 CREATININE 1.10 1.06 0.98 1.02  CALCIUM 8.7* 8.9 8.8* 8.7*   Liver Function Tests: Recent Labs  Lab 07/22/21  0849  AST 23  ALT 14  ALKPHOS 54  BILITOT 0.6  PROT 6.9  ALBUMIN 3.5   No results for input(s): LIPASE, AMYLASE in the last 168 hours. No results for input(s): AMMONIA in the last 168 hours. CBC: Recent Labs  Lab 07/22/21 0849 07/23/21 0550 07/24/21 0537 07/25/21 0526  WBC 6.5 5.6 6.4 5.5  NEUTROABS 5.0  --   --   --   HGB 11.4* 12.5* 10.9* 10.2*  HCT 34.6* 38.2* 33.5* 31.0*  MCV 103.3* 101.6* 101.8* 99.4  PLT 93* 87* 83* 88*   Cardiac Enzymes: No results for input(s): CKTOTAL, CKMB, CKMBINDEX, TROPONINI in the last 168 hours. BNP: Invalid input(s): POCBNP CBG: Recent Labs  Lab 07/25/21 1144 07/25/21 1550 07/25/21 2029 07/26/21 0600 07/26/21 0827  GLUCAP 106* 79 191* 113* 115*   D-Dimer No results for  input(s): DDIMER in the last 72 hours. Hgb A1c No results for input(s): HGBA1C in the last 72 hours. Lipid Profile No results for input(s): CHOL, HDL, LDLCALC, TRIG, CHOLHDL, LDLDIRECT in the last 72 hours. Thyroid function studies No results for input(s): TSH, T4TOTAL, T3FREE, THYROIDAB in the last 72 hours.  Invalid input(s): FREET3 Anemia work up No results for input(s): VITAMINB12, FOLATE, FERRITIN, TIBC, IRON, RETICCTPCT in the last 72 hours. Urinalysis    Component Value Date/Time   COLORURINE YELLOW 07/22/2021 0930   APPEARANCEUR CLEAR 07/22/2021 0930   APPEARANCEUR Clear 03/14/2016 1027   LABSPEC 1.015 07/22/2021 0930   LABSPEC 1.017 01/29/2014 1545   PHURINE >9.0 (H) 07/22/2021 0930   GLUCOSEU NEGATIVE 07/22/2021 0930   GLUCOSEU Negative 01/29/2014 1545   HGBUR TRACE (A) 07/22/2021 0930   BILIRUBINUR NEGATIVE 07/22/2021 0930   BILIRUBINUR Negative 03/14/2016 1027   BILIRUBINUR Negative 01/29/2014 1545   KETONESUR NEGATIVE 07/22/2021 0930   PROTEINUR 30 (A) 07/22/2021 0930   NITRITE NEGATIVE 07/22/2021 0930   LEUKOCYTESUR NEGATIVE 07/22/2021 0930   LEUKOCYTESUR Negative 01/29/2014 1545   Sepsis Labs Invalid input(s): PROCALCITONIN,  WBC,  LACTICIDVEN Microbiology Recent Results (from the past 240 hour(s))  Resp Panel by RT-PCR (Flu A&B, Covid) Nasopharyngeal Swab     Status: None   Collection Time: 07/22/21  8:47 AM   Specimen: Nasopharyngeal Swab; Nasopharyngeal(NP) swabs in vial transport medium  Result Value Ref Range Status   SARS Coronavirus 2 by RT PCR NEGATIVE NEGATIVE Final    Comment: (NOTE) SARS-CoV-2 target nucleic acids are NOT DETECTED.  The SARS-CoV-2 RNA is generally detectable in upper respiratory specimens during the acute phase of infection. The lowest concentration of SARS-CoV-2 viral copies this assay can detect is 138 copies/mL. A negative result does not preclude SARS-Cov-2 infection and should not be used as the sole basis for treatment  or other patient management decisions. A negative result may occur with  improper specimen collection/handling, submission of specimen other than nasopharyngeal swab, presence of viral mutation(s) within the areas targeted by this assay, and inadequate number of viral copies(<138 copies/mL). A negative result must be combined with clinical observations, patient history, and epidemiological information. The expected result is Negative.  Fact Sheet for Patients:  EntrepreneurPulse.com.au  Fact Sheet for Healthcare Providers:  IncredibleEmployment.be  This test is no t yet approved or cleared by the [place] FDA and  has been authorized for detection and/or diagnosis of SARS-CoV-2 by FDA under an Emergency Use Authorization (EUA). This EUA will remain  in effect (meaning this test can be used) for the duration of the COVID-19 declaration under Section 564(b)(1) of the  Act, 21 U.S.C.section 360bbb-3(b)(1), unless the authorization is terminated  or revoked sooner.       Influenza A by PCR NEGATIVE NEGATIVE Final   Influenza B by PCR NEGATIVE NEGATIVE Final    Comment: (NOTE) The Xpert Xpress SARS-CoV-2/FLU/RSV plus assay is intended as an aid in the diagnosis of influenza from Nasopharyngeal swab specimens and should not be used as a sole basis for treatment. Nasal washings and aspirates are unacceptable for Xpert Xpress SARS-CoV-2/FLU/RSV testing.  Fact Sheet for Patients: EntrepreneurPulse.com.au  Fact Sheet for Healthcare Providers: IncredibleEmployment.be  This test is not yet approved or cleared by the [place] FDA and has been authorized for detection and/or diagnosis of SARS-CoV-2 by FDA under an Emergency Use Authorization (EUA). This EUA will remain in effect (meaning this test can be used) for the duration of the COVID-19 declaration under Section 564(b)(1) of the Act, 21 U.S.C. section  360bbb-3(b)(1), unless the authorization is terminated or revoked.  Performed at [facility], [address], [place], [place] [address]   Urine Culture     Status: None   Collection Time: 07/22/21  9:30 AM   Specimen: Urine, Random  Result Value Ref Range Status   Specimen Description   Final    URINE, RANDOM Performed at [facility], [address] [place], [place] [address]    Special Requests   Final    NONE Performed at [facility], [address], [place], [place] [address]    Culture   Final    NO GROWTH Performed at [facility], [place] [address], [place], [place] [address]    Report Status 07/23/2021 FINAL  Final  Blood Culture (routine x 2)     Status: Abnormal   Collection Time: 07/22/21 10:03 AM   Specimen: BLOOD  Result Value Ref Range Status   Specimen Description   Final    BLOOD BLOOD LEFT HAND Performed at [facility], [address] [place], [place] [address]    Special Requests   Final    BOTTLES DRAWN AEROBIC AND ANAEROBIC Blood Culture adequate volume Performed at [facility], [address] [place], [place] [address]    Culture  Setup Time   Final    GRAM POSITIVE COCCI IN BOTH AEROBIC AND ANAEROBIC BOTTLES CRITICAL RESULT CALLED TO, READ BACK BY AND VERIFIED WITH: JASON ROBBINS @ 2300 ON 07/22/2021 BY CAF    Culture (A)  Final    STREPTOCOCCUS GROUP G SUSCEPTIBILITIES PERFORMED ON PREVIOUS CULTURE WITHIN THE LAST 5 DAYS. Performed at [facility], [place] [address] [place], [place] [address]    Report Status 07/25/2021 FINAL  Final  Blood Culture (routine x 2)     Status: Abnormal   Collection Time: 07/22/21 10:03 AM   Specimen: BLOOD  Result Value Ref Range Status   Specimen Description   Final    BLOOD RIGHT ANTECUBITAL Performed at [facility], [address] [place], [place] [address]    Special Requests   Final    BOTTLES DRAWN AEROBIC ONLY Blood Culture  adequate volume Performed at [place], [place] [address]    Culture  Setup Time   Final    GRAM POSITIVE COCCI AEROBIC BOTTLE ONLY CRITICAL RESULT CALLED TO, READ BACK BY AND VERIFIED WITH: JASON ROBBINS @ 2300 ON 07/22/2021 BY CAF Performed at [facility], [place] [address] [place],  [address]    Culture  STREPTOCOCCUS GROUP G (A)  Final   Report Status 07/25/2021 FINAL  Final   Organism ID, Bacteria STREPTOCOCCUS GROUP G  Final      Susceptibility   Streptococcus group g - MIC*    CLINDAMYCIN RESISTANT Resistant     AMPICILLIN <=0.25 SENSITIVE Sensitive     ERYTHROMYCIN >=8 RESISTANT Resistant     VANCOMYCIN 0.5 SENSITIVE Sensitive     CEFTRIAXONE <=0.12 SENSITIVE Sensitive     LEVOFLOXACIN 0.5 SENSITIVE Sensitive     * STREPTOCOCCUS GROUP G  Blood Culture ID Panel (Reflexed)     Status: Abnormal   Collection Time: 07/22/21 10:03 AM  Result Value Ref Range Status   Enterococcus faecalis NOT DETECTED NOT DETECTED Final   Enterococcus Faecium NOT DETECTED NOT DETECTED Final   Listeria monocytogenes NOT DETECTED NOT DETECTED Final   Staphylococcus species NOT DETECTED NOT DETECTED Final   Staphylococcus aureus (BCID) NOT DETECTED NOT DETECTED Final   Staphylococcus epidermidis NOT DETECTED NOT DETECTED Final   Staphylococcus lugdunensis NOT DETECTED NOT DETECTED Final   Streptococcus species DETECTED (A) NOT DETECTED Final    Comment: Not Enterococcus species, Streptococcus agalactiae, Streptococcus pyogenes, or Streptococcus pneumoniae. CRITICAL RESULT CALLED TO, READ BACK BY AND VERIFIED WITH: JASON ROBBINS @ 2300 ON 07/22/2021 BY CAF    Streptococcus agalactiae NOT DETECTED NOT DETECTED Final   Streptococcus pneumoniae NOT DETECTED NOT DETECTED Final   Streptococcus pyogenes NOT DETECTED NOT DETECTED Final   A.calcoaceticus-baumannii NOT DETECTED NOT DETECTED Final   Bacteroides fragilis NOT DETECTED NOT DETECTED Final    Enterobacterales NOT DETECTED NOT DETECTED Final   Enterobacter cloacae complex NOT DETECTED NOT DETECTED Final   Escherichia coli NOT DETECTED NOT DETECTED Final   Klebsiella aerogenes NOT DETECTED NOT DETECTED Final   Klebsiella oxytoca NOT DETECTED NOT DETECTED Final   Klebsiella pneumoniae NOT DETECTED NOT DETECTED Final   Proteus species NOT DETECTED NOT DETECTED Final   Salmonella species NOT DETECTED NOT DETECTED Final   Serratia marcescens NOT DETECTED NOT DETECTED Final   Haemophilus influenzae NOT DETECTED NOT DETECTED Final   Neisseria meningitidis NOT DETECTED NOT DETECTED Final   Pseudomonas aeruginosa NOT DETECTED NOT DETECTED Final   Stenotrophomonas maltophilia NOT DETECTED NOT DETECTED Final   Candida albicans NOT DETECTED NOT DETECTED Final   Candida auris NOT DETECTED NOT DETECTED Final   Candida glabrata NOT DETECTED NOT DETECTED Final   Candida krusei NOT DETECTED NOT DETECTED Final   Candida parapsilosis NOT DETECTED NOT DETECTED Final   Candida tropicalis NOT DETECTED NOT DETECTED Final   Cryptococcus neoformans/gattii NOT DETECTED NOT DETECTED Final    Comment: Performed at [facility], [address] [place], [place] [address]  MRSA Next Gen by PCR, Nasal     Status: None   Collection Time: 07/22/21  7:25 PM   Specimen: Nasal Mucosa; Nasal Swab  Result Value Ref Range Status   MRSA by PCR Next Gen NOT DETECTED NOT DETECTED Final    Comment: (NOTE) The GeneXpert MRSA Assay (FDA approved for NASAL specimens only), is one component of a comprehensive MRSA colonization surveillance program. It is not intended to diagnose MRSA infection nor to guide or monitor treatment for MRSA infections. Test performance is not FDA approved in patients less than 60 years old. Performed at [facility], [place], [place] [address]   CULTURE, BLOOD (ROUTINE X 2) w Reflex to ID Panel     Status: None (Preliminary result)   Collection Time:  07/23/21 10:41 PM   Specimen: BLOOD LEFT HAND  Result Value Ref Range Status   Specimen Description BLOOD LEFT HAND  Final   Special Requests   Final    BOTTLES DRAWN AEROBIC AND ANAEROBIC Blood Culture adequate volume   Culture   Final    NO GROWTH 3 DAYS Performed at [facility], [address] [place], [place] [address]    Report Status PENDING  Incomplete  CULTURE, BLOOD (ROUTINE X 2) w Reflex to ID Panel     Status: None (Preliminary result)   Collection Time: 07/23/21 10:41 PM   Specimen: BLOOD RIGHT HAND  Result Value Ref Range Status   Specimen Description BLOOD RIGHT HAND  Final   Special Requests   Final    BOTTLES DRAWN AEROBIC AND ANAEROBIC Blood Culture adequate volume   Culture   Final    NO GROWTH 3 DAYS Performed at [facility], [address] [place], [place] [address]    Report Status PENDING  Incomplete     Time coordinating discharge: Over 30 minutes  SIGNED:   Wyvonnia Dusky, MD  [facility] 07/26/2021, 10:34 AM Pager   If 7PM-7AM, please contact night-coverage [email or web address]

## 2021-07-26 NOTE — TOC Progression Note (Signed)
Transition of Care [facility]) - Progression Note    Patient Details  Name: Brian Dodson MRN: [id number] Date of Birth: 05-24-1956  [facility] CM/SW Contact  Caryn Section, RN Phone Number: 07/26/2021, 9:49 AM  Clinical Narrative:  Patient can return to facility today per care team.  Tammy from [facility] aware, will pick patient up at 11am.  Left message with caseworker, Kylie from DSS regarding patient's return.            Expected Discharge Plan and Services                                                 Social Determinants of Health (SDOH) Interventions    Readmission Risk Interventions No flowsheet data found.

## 2021-07-26 NOTE — [place] FL2 (Signed)
[place] MEDICAID FL2 LEVEL OF CARE SCREENING TOOL     IDENTIFICATION  Patient Name: Brian Dodson Birthdate: November 17, 1956 Sex: male Admission Date (Current Location): 07/22/2021  [facility] [place] Number:  Chiropodist and Address:  [facility], [address] [place], [place] [address]      Provider Number: [id number]  Attending Physician Name and Address:  Charise Killian, MD  Relative Name and Phone Number:  Scott,Sharon (Other)   [phone] [facility])   DSS caseworker Jenel Lucks [phone]    Current Level of Care: Hospital Recommended Level of Care: Assisted Living Facility Prior Approval Number:    Date Approved/Denied:   PASRR Number: [id number]  Discharge Plan: Other (Comment) ([facility])    Current Diagnoses: Patient Active Problem List   Diagnosis Date Noted   Sepsis secondary to UTI (HCC) 03/05/2021   Acute metabolic encephalopathy 03/05/2021   Dementia (HCC) 03/05/2021   Memory loss due to medical condition 03/05/2021   AF (paroxysmal atrial fibrillation) (HCC) 03/05/2021   Atrial flutter (HCC) 03/05/2021   Diabetes mellitus type 2, controlled, without complications (HCC) 03/05/2021   Obese 03/05/2021   Anemia of chronic disease 03/04/2021   Acute encephalopathy 03/02/2021   Acute lower UTI 03/02/2021   Unspecified atrial fibrillation (HCC) 03/02/2021   Acute febrile illness    Bilateral lower extremity edema 04/01/2018   Lower extremity pain, bilateral 04/01/2018   Swelling of limb 10/04/2017   Sepsis (HCC) 05/20/2017   Anemia 05/07/2017   B12 deficiency 05/07/2017   Loss of memory 05/07/2017   Atrial fibrillation with RVR (HCC) 05/23/2015   Syncope 05/23/2015   Schizophrenia (HCC) 05/23/2015   Diabetes mellitus, type II (HCC) 05/23/2015   HTN (hypertension) 05/23/2015   GERD (gastroesophageal reflux disease) 05/23/2015   CAD (coronary artery disease) 05/23/2015    Orientation RESPIRATION  BLADDER Height & Weight     Self, Time, Situation, Place  Normal (97% on room air) Continent Weight: 99.8 kg Height:  6' (182.9 cm)  BEHAVIORAL SYMPTOMS/MOOD NEUROLOGICAL BOWEL NUTRITION STATUS      Continent Diet (Carb modified 1600-2000)  AMBULATORY STATUS COMMUNICATION OF NEEDS Skin   Independent Verbally Normal                       Personal Care Assistance Level of Assistance    Bathing Assistance: Independent Feeding assistance: Independent Dressing Assistance: Independent     Functional Limitations Info  Sight, Hearing, Speech Sight Info: Impaired Hearing Info: Adequate Speech Info: Adequate (Dentures)    SPECIAL CARE FACTORS FREQUENCY   (CBG monitoring)                    Contractures Contractures Info: Not present    Additional Factors Info  Code Status, Allergies Code Status Info: FULL CODE Allergies Info: Xarelto           Current Medications (07/26/2021):  This is the current hospital active medication list Current Facility-Administered Medications  Medication Dose Route Frequency Provider Last Rate Last Admin   acetaminophen (TYLENOL) tablet 650 mg  650 mg Oral Q6H PRN Venora Maples, MD       Or   acetaminophen (TYLENOL) suppository 650 mg  650 mg Rectal Q6H PRN Venora Maples, MD       apixaban Everlene Balls) tablet 5 mg  5 mg Oral BID Venora Maples, MD   5 mg at 07/26/21 8144   aspirin EC tablet 81 mg  81 mg Oral Daily Venora Maples, MD   81 mg at 07/26/21 0820   atorvastatin (LIPITOR) tablet 10 mg  10 mg Oral Daily Venora Maples, MD   10 mg at 07/26/21 1287   benztropine (COGENTIN) tablet 1 mg  1 mg Oral QHS Venora Maples, MD   1 mg at 07/25/21 2104   brimonidine (ALPHAGAN) 0.2 % ophthalmic solution 1 drop  1 drop Both Eyes BID Venora Maples, MD   1 drop at 07/26/21 8676   And   timolol (TIMOPTIC) 0.5 % ophthalmic solution 1 drop  1 drop Both Eyes BID Venora Maples, MD   1 drop at 07/26/21 7209    carvedilol (COREG) tablet 6.25 mg  6.25 mg Oral BID WC Venora Maples, MD   6.25 mg at 07/26/21 0820   cefTRIAXone (ROCEPHIN) 2 g in sodium chloride 0.9 % 100 mL IVPB  2 g Intravenous Q24H Opyd, Lavone Neri, MD 200 mL/hr at 07/26/21 0822 2 g at 07/26/21 4709   citalopram (CELEXA) tablet 20 mg  20 mg Oral Daily Venora Maples, MD   20 mg at 07/26/21 0820   divalproex (DEPAKOTE ER) 24 hr tablet 2,000 mg  2,000 mg Oral QHS Venora Maples, MD   2,000 mg at 07/25/21 2105   donepezil (ARICEPT) tablet 10 mg  10 mg Oral QHS Venora Maples, MD   10 mg at 07/25/21 2105   ibuprofen (ADVIL) tablet 600 mg  600 mg Oral Q6H PRN Venora Maples, MD   600 mg at 07/22/21 1403   insulin aspart (novoLOG) injection 0-9 Units  0-9 Units Subcutaneous TID WC Venora Maples, MD   1 Units at 07/23/21 1315   ketorolac (TORADOL) 15 MG/ML injection 15 mg  15 mg Intravenous Q6H PRN Venora Maples, MD       latanoprost (XALATAN) 0.005 % ophthalmic solution 1 drop  1 drop Both Eyes QHS Venora Maples, MD   1 drop at 07/25/21 2104   losartan (COZAAR) tablet 25 mg  25 mg Oral Daily Venora Maples, MD   25 mg at 07/26/21 0821   ondansetron (ZOFRAN) tablet 4 mg  4 mg Oral Q6H PRN Venora Maples, MD       Or   ondansetron [facility]) injection 4 mg  4 mg Intravenous Q6H PRN Venora Maples, MD       pantoprazole (PROTONIX) EC tablet 40 mg  40 mg Oral Daily Venora Maples, MD   40 mg at 07/26/21 0820   polyethylene glycol (MIRALAX / GLYCOLAX) packet 17 g  17 g Oral Daily PRN Venora Maples, MD       risperiDONE (RISPERDAL) tablet 4 mg  4 mg Oral QHS Venora Maples, MD   4 mg at 07/25/21 2105   sodium chloride flush (NS) 0.9 % injection 3 mL  3 mL Intravenous Q12H Venora Maples, MD   3 mL at 07/25/21 2114   traZODone (DESYREL) tablet 25 mg  25 mg Oral QHS PRN Venora Maples, MD   25 mg at 07/23/21 2148     Discharge Medications: Please see discharge summary for a list of  discharge medications.  Relevant Imaging Results:  Relevant Lab Results:   Additional Information SSN [id number]  Caryn Section, RN

## 2021-07-26 NOTE — Progress Notes (Signed)
ID Pt has strep Group G bacteremia- likely soirce cellulitis rt thigh Much improved- received 6 days of IV ceftriaxone- repeat blood culture neg- Will go on Amoxicillin 1 gram PO TID for 7 more days. Discussed with care team

## 2021-07-28 LAB — CULTURE, BLOOD (ROUTINE X 2)
Culture: NO GROWTH
Culture: NO GROWTH
Special Requests: ADEQUATE
Special Requests: ADEQUATE

## 2021-08-15 ENCOUNTER — Inpatient Hospital Stay
Admission: EM | Admit: 2021-08-15 | Discharge: 2021-08-18 | DRG: 603 | Disposition: A | Discharge status: Skilled Nursing Facility | Payer: Medicare Other | Attending: Internal Medicine | Admitting: Internal Medicine

## 2021-08-15 ENCOUNTER — Emergency Department: Payer: Medicare Other

## 2021-08-15 ENCOUNTER — Observation Stay: Payer: Medicare Other

## 2021-08-15 ENCOUNTER — Other Ambulatory Visit: Payer: Self-pay

## 2021-08-15 DIAGNOSIS — F32A Depression, unspecified: Secondary | ICD-10-CM | POA: Diagnosis present

## 2021-08-15 DIAGNOSIS — R001 Bradycardia, unspecified: Secondary | ICD-10-CM | POA: Diagnosis present

## 2021-08-15 DIAGNOSIS — Z833 Family history of diabetes mellitus: Secondary | ICD-10-CM

## 2021-08-15 DIAGNOSIS — Z7984 Long term (current) use of oral hypoglycemic drugs: Secondary | ICD-10-CM

## 2021-08-15 DIAGNOSIS — L0390 Cellulitis, unspecified: Secondary | ICD-10-CM | POA: Diagnosis present

## 2021-08-15 DIAGNOSIS — I480 Paroxysmal atrial fibrillation: Secondary | ICD-10-CM | POA: Diagnosis present

## 2021-08-15 DIAGNOSIS — F419 Anxiety disorder, unspecified: Secondary | ICD-10-CM | POA: Diagnosis present

## 2021-08-15 DIAGNOSIS — R413 Other amnesia: Secondary | ICD-10-CM

## 2021-08-15 DIAGNOSIS — N400 Enlarged prostate without lower urinary tract symptoms: Secondary | ICD-10-CM | POA: Diagnosis present

## 2021-08-15 DIAGNOSIS — F203 Undifferentiated schizophrenia: Secondary | ICD-10-CM

## 2021-08-15 DIAGNOSIS — Z882 Allergy status to sulfonamides status: Secondary | ICD-10-CM

## 2021-08-15 DIAGNOSIS — D638 Anemia in other chronic diseases classified elsewhere: Secondary | ICD-10-CM | POA: Diagnosis not present

## 2021-08-15 DIAGNOSIS — I2510 Atherosclerotic heart disease of native coronary artery without angina pectoris: Secondary | ICD-10-CM | POA: Diagnosis not present

## 2021-08-15 DIAGNOSIS — I10 Essential (primary) hypertension: Secondary | ICD-10-CM | POA: Diagnosis present

## 2021-08-15 DIAGNOSIS — R0789 Other chest pain: Secondary | ICD-10-CM | POA: Diagnosis present

## 2021-08-15 DIAGNOSIS — K219 Gastro-esophageal reflux disease without esophagitis: Secondary | ICD-10-CM | POA: Diagnosis present

## 2021-08-15 DIAGNOSIS — Z955 Presence of coronary angioplasty implant and graft: Secondary | ICD-10-CM

## 2021-08-15 DIAGNOSIS — Z7901 Long term (current) use of anticoagulants: Secondary | ICD-10-CM

## 2021-08-15 DIAGNOSIS — F0150 Vascular dementia without behavioral disturbance: Secondary | ICD-10-CM | POA: Diagnosis present

## 2021-08-15 DIAGNOSIS — Z7982 Long term (current) use of aspirin: Secondary | ICD-10-CM

## 2021-08-15 DIAGNOSIS — L03116 Cellulitis of left lower limb: Principal | ICD-10-CM | POA: Diagnosis present

## 2021-08-15 DIAGNOSIS — E119 Type 2 diabetes mellitus without complications: Secondary | ICD-10-CM | POA: Diagnosis present

## 2021-08-15 DIAGNOSIS — Z20822 Contact with and (suspected) exposure to covid-19: Secondary | ICD-10-CM | POA: Diagnosis present

## 2021-08-15 DIAGNOSIS — F0390 Unspecified dementia without behavioral disturbance: Secondary | ICD-10-CM | POA: Diagnosis present

## 2021-08-15 DIAGNOSIS — E785 Hyperlipidemia, unspecified: Secondary | ICD-10-CM | POA: Diagnosis present

## 2021-08-15 DIAGNOSIS — F819 Developmental disorder of scholastic skills, unspecified: Secondary | ICD-10-CM | POA: Diagnosis present

## 2021-08-15 DIAGNOSIS — Z79899 Other long term (current) drug therapy: Secondary | ICD-10-CM

## 2021-08-15 DIAGNOSIS — Z888 Allergy status to other drugs, medicaments and biological substances status: Secondary | ICD-10-CM

## 2021-08-15 DIAGNOSIS — Z87891 Personal history of nicotine dependence: Secondary | ICD-10-CM

## 2021-08-15 DIAGNOSIS — J449 Chronic obstructive pulmonary disease, unspecified: Secondary | ICD-10-CM | POA: Diagnosis present

## 2021-08-15 DIAGNOSIS — R4189 Other symptoms and signs involving cognitive functions and awareness: Secondary | ICD-10-CM | POA: Diagnosis present

## 2021-08-15 DIAGNOSIS — F209 Schizophrenia, unspecified: Secondary | ICD-10-CM | POA: Diagnosis present

## 2021-08-15 LAB — COMPREHENSIVE METABOLIC PANEL
ALT: 11 U/L (ref 0–44)
AST: 18 U/L (ref 15–41)
Albumin: 3.4 g/dL — ABNORMAL LOW (ref 3.5–5.0)
Alkaline Phosphatase: 55 U/L (ref 38–126)
Anion gap: 9 (ref 5–15)
BUN: 33 mg/dL — ABNORMAL HIGH (ref 8–23)
CO2: 29 mmol/L (ref 22–32)
Calcium: 9.2 mg/dL (ref 8.9–10.3)
Chloride: 103 mmol/L (ref 98–111)
Creatinine, Ser: 1.02 mg/dL (ref 0.61–1.24)
GFR, Estimated: >60 mL/min (ref >60)
Glucose, Bld: 76 mg/dL (ref 70–99)
Potassium: 4.2 mmol/L (ref 3.5–5.1)
Sodium: 141 mmol/L (ref 135–145)
Total Bilirubin: 0.5 mg/dL (ref 0.3–1.2)
Total Protein: 7.1 g/dL (ref 6.5–8.1)

## 2021-08-15 LAB — CBC WITH DIFFERENTIAL/PLATELET
Abs Immature Granulocytes: 0.08 10*3/uL — ABNORMAL HIGH (ref 0.00–0.07)
Basophils Absolute: 0 10*3/uL (ref 0.0–0.1)
Basophils Relative: 0 %
Eosinophils Absolute: 0.2 10*3/uL (ref 0.0–0.5)
Eosinophils Relative: 3 %
HCT: 30.2 % — ABNORMAL LOW (ref 39.0–52.0)
Hemoglobin: 10.1 g/dL — ABNORMAL LOW (ref 13.0–17.0)
Immature Granulocytes: 2 %
Lymphocytes Relative: 45 %
Lymphs Abs: 2.5 10*3/uL (ref 0.7–4.0)
MCH: 34.5 pg — ABNORMAL HIGH (ref 26.0–34.0)
MCHC: 33.4 g/dL (ref 30.0–36.0)
MCV: 103.1 fL — ABNORMAL HIGH (ref 80.0–100.0)
Monocytes Absolute: 0.5 10*3/uL (ref 0.1–1.0)
Monocytes Relative: 9 %
Neutro Abs: 2.3 10*3/uL (ref 1.7–7.7)
Neutrophils Relative %: 41 %
Platelets: 181 10*3/uL (ref 150–400)
RBC: 2.93 MIL/uL — ABNORMAL LOW (ref 4.22–5.81)
RDW: 14 % (ref 11.5–15.5)
WBC: 5.5 10*3/uL (ref 4.0–10.5)
nRBC: 0 % (ref 0.0–0.2)

## 2021-08-15 LAB — LACTIC ACID, PLASMA
Lactic Acid, Venous: 1.6 mmol/L (ref 0.5–1.9)
Lactic Acid, Venous: 1.5 mmol/L (ref 0.5–1.9)

## 2021-08-15 LAB — URINALYSIS, COMPLETE (UACMP) WITH MICROSCOPIC
Bacteria, UA: NONE SEEN
Bilirubin Urine: NEGATIVE
Glucose, UA: NEGATIVE mg/dL
Hgb urine dipstick: NEGATIVE
Ketones, ur: 5 mg/dL — AB
Leukocytes,Ua: NEGATIVE
Nitrite: NEGATIVE
Protein, ur: NEGATIVE mg/dL
Specific Gravity, Urine: 1.033 — ABNORMAL HIGH (ref 1.005–1.030)
WBC, UA: NONE SEEN WBC/hpf (ref 0–5)
pH: 5 (ref 5.0–8.0)

## 2021-08-15 LAB — TROPONIN I (HIGH SENSITIVITY)
Troponin I (High Sensitivity): 4 ng/L (ref <18)
Troponin I (High Sensitivity): 5 ng/L (ref <18)

## 2021-08-15 LAB — PROTIME-INR
INR: 1 (ref 0.8–1.2)
Prothrombin Time: 13.6 seconds (ref 11.4–15.2)

## 2021-08-15 LAB — PROCALCITONIN: Procalcitonin: <0.1 ng/mL

## 2021-08-15 MED ORDER — BENZTROPINE MESYLATE 1 MG PO TABS
1.0000 mg | ORAL_TABLET | Freq: Every day | ORAL | Status: DC
  Administered 2021-08-15 – 2021-08-17 (×3): 1 mg via ORAL
  Filled 2021-08-15 (×4): qty 1

## 2021-08-15 MED ORDER — METFORMIN HCL 500 MG PO TABS
500.0000 mg | ORAL_TABLET | Freq: Two times a day (BID) | ORAL | Status: DC
  Administered 2021-08-15 – 2021-08-18 (×6): 500 mg via ORAL
  Filled 2021-08-15 (×7): qty 1

## 2021-08-15 MED ORDER — ATORVASTATIN CALCIUM 10 MG PO TABS
10.0000 mg | ORAL_TABLET | Freq: Every day | ORAL | Status: DC
  Administered 2021-08-15 – 2021-08-17 (×3): 10 mg via ORAL
  Filled 2021-08-15 (×3): qty 1

## 2021-08-15 MED ORDER — SODIUM CHLORIDE 0.9 % IV SOLN
2.0000 g | INTRAVENOUS | Status: DC
  Administered 2021-08-16 – 2021-08-18 (×3): 2 g via INTRAVENOUS
  Filled 2021-08-15: qty 20
  Filled 2021-08-15: qty 2
  Filled 2021-08-15: qty 20

## 2021-08-15 MED ORDER — DIVALPROEX SODIUM ER 500 MG PO TB24
2000.0000 mg | ORAL_TABLET | Freq: Every day | ORAL | Status: DC
  Administered 2021-08-15 – 2021-08-17 (×3): 2000 mg via ORAL
  Filled 2021-08-15: qty 4
  Filled 2021-08-15: qty 8
  Filled 2021-08-15 (×2): qty 4

## 2021-08-15 MED ORDER — AMIODARONE HCL 200 MG PO TABS: 200.0000 mg | ORAL_TABLET | Freq: Every day | ORAL | Status: DC

## 2021-08-15 MED ORDER — APIXABAN 5 MG PO TABS
5.0000 mg | ORAL_TABLET | Freq: Two times a day (BID) | ORAL | Status: DC
  Administered 2021-08-15 – 2021-08-18 (×6): 5 mg via ORAL
  Filled 2021-08-15 (×6): qty 1

## 2021-08-15 MED ORDER — CITALOPRAM HYDROBROMIDE 20 MG PO TABS
20.0000 mg | ORAL_TABLET | Freq: Every day | ORAL | Status: DC
  Administered 2021-08-16 – 2021-08-18 (×3): 20 mg via ORAL
  Filled 2021-08-15 (×3): qty 1

## 2021-08-15 MED ORDER — BRIMONIDINE TARTRATE-TIMOLOL 0.2-0.5 % OP SOLN
1.0000 [drp] | Freq: Two times a day (BID) | OPHTHALMIC | Status: DC
  Filled 2021-08-15: qty 5

## 2021-08-15 MED ORDER — ASPIRIN EC 81 MG PO TBEC
81.0000 mg | DELAYED_RELEASE_TABLET | Freq: Every day | ORAL | Status: DC
  Administered 2021-08-16 – 2021-08-18 (×3): 81 mg via ORAL
  Filled 2021-08-15 (×3): qty 1

## 2021-08-15 MED ORDER — RISPERIDONE 3 MG PO TABS
4.0000 mg | ORAL_TABLET | Freq: Every day | ORAL | Status: DC
  Administered 2021-08-15 – 2021-08-17 (×3): 4 mg via ORAL
  Filled 2021-08-15 (×3): qty 1
  Filled 2021-08-15: qty 4

## 2021-08-15 MED ORDER — AMIODARONE HCL 200 MG PO TABS
100.0000 mg | ORAL_TABLET | Freq: Every day | ORAL | Status: DC
  Administered 2021-08-15 – 2021-08-18 (×4): 100 mg via ORAL
  Filled 2021-08-15 (×5): qty 1

## 2021-08-15 MED ORDER — AMLODIPINE BESYLATE 5 MG PO TABS: 5.0000 mg | ORAL_TABLET | Freq: Every day | ORAL | Status: DC

## 2021-08-15 MED ORDER — BRIMONIDINE TARTRATE 0.2 % OP SOLN
1.0000 [drp] | Freq: Two times a day (BID) | OPHTHALMIC | Status: DC
  Administered 2021-08-15 – 2021-08-18 (×6): 1 [drp] via OPHTHALMIC
  Filled 2021-08-15 (×2): qty 5

## 2021-08-15 MED ORDER — HYDRALAZINE HCL 20 MG/ML IJ SOLN: 5.0000 mg | Freq: Four times a day (QID) | INTRAMUSCULAR | Status: AC | PRN

## 2021-08-15 MED ORDER — DONEPEZIL HCL 5 MG PO TABS
10.0000 mg | ORAL_TABLET | Freq: Every day | ORAL | Status: DC
  Administered 2021-08-15 – 2021-08-17 (×3): 10 mg via ORAL
  Filled 2021-08-15 (×3): qty 2

## 2021-08-15 MED ORDER — TIMOLOL MALEATE 0.5 % OP SOLN
1.0000 [drp] | Freq: Two times a day (BID) | OPHTHALMIC | Status: DC
  Administered 2021-08-15 – 2021-08-18 (×6): 1 [drp] via OPHTHALMIC
  Filled 2021-08-15 (×2): qty 5

## 2021-08-15 MED ORDER — SODIUM CHLORIDE 0.9 % IV SOLN
2.0000 g | Freq: Once | INTRAVENOUS | Status: AC
  Administered 2021-08-15: 2 g via INTRAVENOUS
  Filled 2021-08-15: qty 20

## 2021-08-15 MED ORDER — MELATONIN 5 MG PO TABS
5.0000 mg | ORAL_TABLET | Freq: Every day | ORAL | Status: DC
  Administered 2021-08-15 – 2021-08-17 (×3): 5 mg via ORAL
  Filled 2021-08-15 (×3): qty 1

## 2021-08-15 MED ORDER — LOSARTAN POTASSIUM 25 MG PO TABS
25.0000 mg | ORAL_TABLET | Freq: Every day | ORAL | Status: DC
  Administered 2021-08-15 – 2021-08-18 (×4): 25 mg via ORAL
  Filled 2021-08-15 (×4): qty 1

## 2021-08-15 NOTE — ED Provider Notes (Signed)
EKG obtained as patient noted to have occasional bradycardia.  Reviewed by me at 1330 Heart rate 43 QRS 110 QTc 430 Sinus bradycardia, slight artifact.  No evidence of acute ischemia denoted.   Sharyn Creamer, MD 08/15/21 [phone]

## 2021-08-15 NOTE — ED Provider Notes (Signed)
[facility] Emergency Department Provider Note ____________________________________________   Event Date/Time   First MD Initiated Contact with Patient 08/15/21 1311     (approximate)  I have reviewed the triage vital signs and the nursing notes.   HISTORY  Chief Complaint Wound Infection  Level 5 caveat: History of present illness limited due to schizophrenia and dementia  HPI Brian Dodson is a 65 y.o. male with PMH as noted below who presents with swelling and redness to his left leg.  Per EMS, the patient has been treated with an oral antibiotic which from the nursing home paperwork appears to be amoxicillin.  The patient reports a small wound to the knee before the redness started.  He denies any pain elsewhere, or other acute symptoms.  Past Medical History:  Diagnosis Date   Asthma    Atrial fibrillation (HCC)    Benign prostatic hypertrophy with lower urinary tract symptoms (LUTS)    Bulbous urethral stricture    CAD (coronary artery disease)    s/p coronary stent 2003   Constipation    COPD (chronic obstructive pulmonary disease) (HCC)    Diabetes mellitus without complication (HCC)    Dizziness    Dyspnea    GERD (gastroesophageal reflux disease)    Gross hematuria    Hyperlipemia    Hypertension    Lumbago    Palpitation    Schizophrenia (HCC)     Patient Active Problem List   Diagnosis Date Noted   Cellulitis 08/15/2021   Sepsis secondary to UTI (HCC) 03/05/2021   Acute metabolic encephalopathy 03/05/2021   Dementia (HCC) 03/05/2021   Memory loss due to medical condition 03/05/2021   AF (paroxysmal atrial fibrillation) (HCC) 03/05/2021   Atrial flutter (HCC) 03/05/2021   Diabetes mellitus type 2, controlled, without complications (HCC) 03/05/2021   Obese 03/05/2021   Anemia of chronic disease 03/04/2021   Acute encephalopathy 03/02/2021   Acute lower UTI 03/02/2021   Unspecified atrial fibrillation (HCC) 03/02/2021   Acute  febrile illness    Bilateral lower extremity edema 04/01/2018   Lower extremity pain, bilateral 04/01/2018   Swelling of limb 10/04/2017   Sepsis (HCC) 05/20/2017   Anemia 05/07/2017   B12 deficiency 05/07/2017   Loss of memory 05/07/2017   Atrial fibrillation with RVR (HCC) 05/23/2015   Syncope 05/23/2015   Schizophrenia (HCC) 05/23/2015   Diabetes mellitus, type II (HCC) 05/23/2015   HTN (hypertension) 05/23/2015   GERD (gastroesophageal reflux disease) 05/23/2015   CAD (coronary artery disease) 05/23/2015    Past Surgical History:  Procedure Laterality Date   COLONOSCOPY     CORONARY ANGIOPLASTY WITH STENT PLACEMENT  11/2003   kidney stent      Prior to Admission medications   Medication Sig Start Date End Date Taking? Authorizing Provider  amiodarone (PACERONE) 200 MG tablet Take 1 tablet (200 mg total) by mouth daily. 07/26/21  Yes Charise Killian, MD  aspirin 81 MG tablet Take 81 mg by mouth daily.    Yes [provider]  atorvastatin (LIPITOR) 10 MG tablet Take 10 mg by mouth daily. 05/18/15  Yes [provider]  benztropine (COGENTIN) 1 MG tablet Take 1 mg by mouth at bedtime.    Yes [provider]  brimonidine-timolol (COMBIGAN) 0.2-0.5 % ophthalmic solution Place 1 drop into both eyes every 12 (twelve) hours. Wait 3 to 5 minutes between drops   Yes [provider]  carvedilol (COREG) 6.25 MG tablet Take 6.25 mg by mouth  2 (two) times daily with a meal. 07/05/21  Yes [provider]  Cholecalciferol (EQL VITAMIN D3) 50 MCG (2000 UT) CAPS Take 1 capsule by mouth daily.   Yes [provider]  citalopram (CELEXA) 20 MG tablet Take 20 mg by mouth daily.   Yes [provider]  clindamycin (CLEOCIN) 300 MG capsule Take 300 mg by mouth 3 (three) times daily. 08/11/21  Yes [provider]  diclofenac Sodium (VOLTAREN) 1 % GEL Apply 2 g topically 4 (four) times daily. Apply to neck and shoulders   Yes [provider]  divalproex (DEPAKOTE ER) 500 MG 24 hr tablet Take 2,000 mg by mouth at bedtime.    Yes [provider]  donepezil (ARICEPT) 10 MG tablet Take 10 mg by mouth at bedtime.    Yes [provider]  ELIQUIS 5 MG TABS tablet Take 5 mg by mouth 2 (two) times daily. 05/18/15  Yes [provider]  ferrous sulfate 325 (65 FE) MG tablet Take 325 mg by mouth daily with breakfast.   Yes [provider]  lanolin/mineral oil (KERI/THERA-DERM) LOTN Apply 1 application topically as needed for dry skin. Apply to face after washing face daily   Yes [provider]  losartan (COZAAR) 25 MG tablet Take 1 tablet (25 mg total) by mouth daily. May increase to 2 tablets by mouth daily if blood pressure goes above 140/90 for more than a day  or if the blood pressure goes above 150/100 at all. Patient taking differently: Take 25 mg by mouth daily. 11/18/18 05/03/22 Yes Arnaldo Natal, MD  LUMIGAN 0.01 % SOLN Place 1 drop into both eyes at bedtime. 07/06/21  Yes [provider]  melatonin 5 MG TABS Take 5 mg by mouth at bedtime.   Yes [provider]  metFORMIN (GLUCOPHAGE) 500 MG tablet Take 500 mg by mouth 2 (two) times daily with a meal.   Yes [provider]  omeprazole (PRILOSEC) 20 MG capsule Take 20 mg by mouth 2 (two) times daily before a meal. May open and sprinkle in applesauce.   Yes [provider]  [name] (OYSTER CALCIUM) 500 MG TABS tablet Take 500 mg of elemental calcium by mouth 2 (two) times daily.   Yes [provider]  risperidone (RISPERDAL) 4 MG tablet Take 4 mg by mouth at bedtime. 05/18/15  Yes [provider]  vitamin C (ASCORBIC ACID) 250 MG tablet Take 1 tablet (250 mg total) by mouth daily. 03/05/21  Yes Drema Dallas, MD  acetaminophen (TYLENOL) 500 MG tablet Take 1,000 mg by mouth every 6 (six) hours as needed for mild pain. Patient not taking: Reported on 07/22/2021    [provider]  amLODipine (NORVASC) 5 MG tablet Take 5 mg by mouth daily. Patient not taking: Reported on 07/22/2021    [provider]  divalproex (DEPAKOTE) 500 MG DR tablet Take 1,000 mg by mouth 2 (two) times daily. Patient not taking: Reported on 08/15/2021 08/03/21   [provider]  furosemide (LASIX) 20 MG tablet Take 20 mg by mouth daily as needed. For edema (swelling) 06/22/21   [provider]  latanoprost (XALATAN) 0.005 % ophthalmic solution Place 1 drop into both eyes at bedtime. Patient not taking: Reported on 07/22/2021    [provider]  Menthol, Topical Analgesic, (BIOFREEZE) 4 % GEL Apply 1 application topically 3 (three) times daily. To lower back area as needed for pain    [provider]    Allergies Sulfa antibiotics and Xarelto [rivaroxaban]  Family History  Problem Relation Age of Onset   Diabetes Other    Bladder Cancer Neg Hx    Kidney cancer Neg Hx    Prostate cancer Neg Hx     Social History Social History   Tobacco Use   Smoking status: Former   Smokeless tobacco: Never  Substance Use Topics   Alcohol use: No   Drug use: No    Review of Systems Level 5 caveat: Unable to obtain review of systems due to dementia and schizophrenia    ____________________________________________   PHYSICAL EXAM:  VITAL SIGNS: ED Triage Vitals  Enc Vitals Group     BP 08/15/21 1110 (!) 257/229     Pulse Rate 08/15/21 1223 79     Resp 08/15/21 1110 17     Temp 08/15/21 1110 98 F (36.7 C)     Temp Source 08/15/21 1110 Oral     SpO2 08/15/21 1110 100 %     Weight --      Height --      Head Circumference --      Peak Flow --      Pain Score 08/15/21 1300 0     Pain Loc --      Pain Edu? --      Excl. in GC? --     Constitutional: Alert and oriented. Well appearing and in no acute distress. Eyes: Conjunctivae are normal.  Head: Atraumatic. Nose: No congestion/rhinnorhea. Mouth/Throat: Mucous membranes are  moist.   Neck: Normal range of motion.  Cardiovascular: Normal rate, regular rhythm.  Good peripheral circulation. Respiratory: Normal respiratory effort.  No retractions.  Gastrointestinal: No distention.  Musculoskeletal: 2+ left lower extremity edema below the knee, with erythema, induration, and warmth extending from the proximal anterior knee medial.  Extremities warm and well perfused.  Neurologic:  Normal speech and language. No gross focal neurologic deficits are appreciated.  Skin:  Skin is warm and dry. No rash noted. Psychiatric: Mood and affect are normal. Speech and behavior are normal.  ____________________________________________   LABS (all labs ordered are listed, but only abnormal results are displayed)  Labs Reviewed  COMPREHENSIVE METABOLIC PANEL - Abnormal; Notable for the following components:      Result Value   BUN 33 (*)    Albumin 3.4 (*)    All other components within normal limits  CBC WITH DIFFERENTIAL/PLATELET - Abnormal; Notable for the following components:   RBC 2.93 (*)    Hemoglobin 10.1 (*)    HCT 30.2 (*)    MCV 103.1 (*)    MCH 34.5 (*)    Abs Immature Granulocytes 0.08 (*)    All other components within normal limits  CULTURE, BLOOD (ROUTINE X 2)  CULTURE, BLOOD (ROUTINE X 2)  LACTIC ACID, PLASMA  PROTIME-INR  LACTIC ACID, PLASMA  URINALYSIS, COMPLETE (UACMP) WITH MICROSCOPIC   ____________________________________________  EKG  ED ECG REPORT I, Dionne Bucy, the attending physician, personally viewed and interpreted this ECG.  Date: 08/15/2021 EKG Time: 1330 Rate: 43 Rhythm: normal sinus rhythm QRS Axis: normal Intervals: normal ST/T Wave abnormalities: Nonspecific ST abnormalities Narrative Interpretation: Nonspecific abnormalities with no evidence of acute ischemia  ____________________________________________  RADIOLOGY  US venous LLE: No acute  DVT  ____________________________________________   PROCEDURES  Procedure(s) performed: No  Procedures  Critical Care performed: No ____________________________________________   INITIAL IMPRESSION / ASSESSMENT AND PLAN / ED COURSE  Pertinent labs & imaging  results that were available during my care of the patient were reviewed by me and considered in my medical decision making (see chart for details).   65 year old male with PMH as noted above presents from his facility with left lower extremity redness and swelling after a small wound on his R knee.  He has apparently been on amoxicillin for cellulitis with no improvement.  He has no other complaints.    On exam the patient is well appearing and his vital signs are normal.  He has a large area of erythema, induration and warmth to the medial aspect of the left knee and lower leg, and the lower leg is also significantly swollen.  Exam is otherwise unremarkable.  Presentation is overall consistent with cellulitis although I am somewhat concerned for DVT given the degree of swelling to the left leg.  The extremity is neuro/vascular intact.  We will start antibiotics, obtain an ultrasound to rule out DVT, and plan for admission for IV antibiotics.  ----------------------------------------- 4:00 PM on 08/15/2021 -----------------------------------------  DVT study is negative.  Antibiotics have been initiated.  I consulted Dr. Sedalia Muta from the hospitalist service for admission.   ____________________________________________   FINAL CLINICAL IMPRESSION(S) / ED DIAGNOSES  Final diagnoses:  Cellulitis of left lower extremity      NEW MEDICATIONS STARTED DURING THIS VISIT:  New Prescriptions   No medications on file     Note:  This document was prepared using Dragon voice recognition software and may include unintentional dictation errors.    Dionne Bucy, MD 08/15/21 1601

## 2021-08-15 NOTE — Consult Note (Signed)
[facility] ([facility]) Cardiology  CARDIOLOGY CONSULT NOTE  Patient ID: Brian Dodson MRN: [id number] DOB/AGE: [birth year] 65 y.o.  Admit date: 08/15/2021 Referring Physician [facility] Primary Cardiologist [facility] Reason for Consultation chest pain, palpitations, bradycardia  HPI: 65 year old gentleman referred for evaluation of chest pain, palpitations, and bradycardia.  Patient presents from nursing home with swelling and redness of left leg which was treated as an outpatient with amoxicillin.  Ultrasound negative for DVT.  On questioning, it is unclear whether patient actually is experiencing chest pain.  The patient has a history of prior CVA and vascular dementia and is a poor historian.  ECG reveals sinus bradycardia at 43 BPM without ischemic ST-T wave changes.  Troponin pending.  Telemetry reveals sinus rhythm at 63 bpm.  Patient appears clinically and hemodynamically stable.  Patient has history of coronary artery disease, status post stent in 2003.  Also has a history of paroxysmal atrial fibrillation, on Eliquis for stroke prevention, and amiodarone for rate and rhythm control.  Recent 2D echocardiogram 66/02/2021 reveals normal left ventricular function, with LVEF 60 to 65%.  Review of systems complete and found to be negative unless listed above     Past Medical History:  Diagnosis Date   Asthma    Atrial fibrillation (HCC)    Benign prostatic hypertrophy with lower urinary tract symptoms (LUTS)    Bulbous urethral stricture    CAD (coronary artery disease)    s/p coronary stent 2003   Constipation    COPD (chronic obstructive pulmonary disease) (HCC)    Diabetes mellitus without complication (HCC)    Dizziness    Dyspnea    GERD (gastroesophageal reflux disease)    Gross hematuria    Hyperlipemia    Hypertension    Lumbago    Palpitation    Schizophrenia (HCC)     Past Surgical History:  Procedure Laterality Date   COLONOSCOPY     CORONARY ANGIOPLASTY WITH STENT  PLACEMENT  11/2003   kidney stent      (Not in a hospital admission)  Social History   Socioeconomic History   Marital status: Single    Spouse name: Not on file   Number of children: Not on file   Years of education: Not on file   Highest education level: Not on file  Occupational History   Not on file  Tobacco Use   Smoking status: Former   Smokeless tobacco: Never  Substance and Sexual Activity   Alcohol use: No   Drug use: No   Sexual activity: Not on file  Other Topics Concern   Not on file  Social History Narrative   Not on file   Social Determinants of Health   Financial Resource Strain: Not on file  Food Insecurity: Not on file  Transportation Needs: Not on file  Physical Activity: Not on file  Stress: Not on file  Social Connections: Not on file  Intimate Partner Violence: Not on file    Family History  Problem Relation Age of Onset   Diabetes Other    Bladder Cancer Neg Hx    Kidney cancer Neg Hx    Prostate cancer Neg Hx       Review of systems complete and found to be negative unless listed above      PHYSICAL EXAM  General: Well developed, well nourished, in no acute distress HEENT:  Normocephalic and atramatic Neck:  No JVD.  Lungs: Clear bilaterally to auscultation and percussion. Heart: HRRR . Normal S1  and S2 without gallops or murmurs.  Abdomen: Bowel sounds are positive, abdomen soft and non-tender  Msk:  Back normal, normal gait. Normal strength and tone for age. Extremities: No clubbing, cyanosis or edema.   Neuro: Alert and oriented X 3. Psych:  Good affect, responds appropriately  Labs:   Lab Results  Component Value Date   WBC 5.5 08/15/2021   HGB 10.1 (L) 08/15/2021   HCT 30.2 (L) 08/15/2021   MCV 103.1 (H) 08/15/2021   PLT 181 08/15/2021    Recent Labs  Lab 08/15/21 1139  NA 141  K 4.2  CL 103  CO2 29  BUN 33*  CREATININE 1.02  CALCIUM 9.2  PROT 7.1  BILITOT 0.5  ALKPHOS 55  ALT 11  AST 18  GLUCOSE 76    Lab Results  Component Value Date   TROPONINI <0.03 11/18/2018    Lab Results  Component Value Date   CHOL 129 03/04/2021   CHOL 150 03/02/2021   Lab Results  Component Value Date   HDL 59 03/04/2021   HDL 64 03/02/2021   Lab Results  Component Value Date   LDLCALC 64 03/04/2021   LDLCALC 80 03/02/2021   Lab Results  Component Value Date   TRIG 31 03/04/2021   TRIG 28 03/02/2021   Lab Results  Component Value Date   CHOLHDL 2.2 03/04/2021   CHOLHDL 2.3 03/02/2021   No results found for: LDLDIRECT    Radiology: CT Head Wo Contrast  Result Date: 07/22/2021 CLINICAL DATA:  Mental status change, atrial fibrillation, dementia EXAM: CT HEAD WITHOUT CONTRAST TECHNIQUE: Contiguous axial images were obtained from the base of the skull through the vertex without intravenous contrast. COMPARISON:  03/02/2021 FINDINGS: Brain: Atrophy and white matter microvascular ischemic changes throughout both cerebral hemispheres. No acute intracranial hemorrhage, new mass lesion, definite infarction, midline shift, herniation, hydrocephalus, or extra-axial fluid collection. No focal mass effect or edema. Cisterns are patent. Cerebellar atrophy as well. Vascular: No hyperdense vessel or unexpected calcification. Skull: Normal. Negative for fracture or focal lesion. Sinuses/Orbits: No acute finding. Other: None. IMPRESSION: Chronic atrophy and white matter microvascular ischemic changes. No acute intracranial abnormality by noncontrast CT. Electronically Signed   By: Judie Petit.  Shick M.D.   On: 07/22/2021 12:09   CT Abdomen Pelvis W Contrast  Result Date: 07/22/2021 CLINICAL DATA:  Evaluate for abdominal abscess or infection. EXAM: CT ABDOMEN AND PELVIS WITH CONTRAST TECHNIQUE: Multidetector CT imaging of the abdomen and pelvis was performed using the standard protocol following bolus administration of intravenous contrast. CONTRAST:  67mL OMNIPAQUE IOHEXOL 350 MG/ML SOLN COMPARISON:  CT abdomen pelvis  11/21/2005. FINDINGS: Lower chest: Normal heart size. Dependent atelectasis within the bilateral lower lobes. Hepatobiliary: Streak artifact limits evaluation. Liver is normal in size and contour. Gallbladder is unremarkable. Pancreas: Unremarkable Spleen: Unremarkable Adrenals/Urinary Tract: Normal adrenal glands. Kidneys enhance symmetrically with contrast. Small amount of perinephric fluid bilaterally. No hydronephrosis. Urinary bladder is unremarkable. Stomach/Bowel: Stool throughout the colon. No abnormal bowel wall thickening or evidence for bowel obstruction. No free fluid or free intraperitoneal air. Normal appendix. Normal morphology of the stomach. Vascular/Lymphatic: Normal caliber abdominal aorta. No retroperitoneal lymphadenopathy. Reproductive: Heterogeneous prostate. Other: None. Musculoskeletal: Lower thoracic and lumbar spine degenerative changes. No aggressive or acute appearing osseous lesions. IMPRESSION: Limited exam due to streak artifact. Small amount of perinephric fluid bilaterally. Recommend correlation with urinalysis to exclude urinary tract infection. Otherwise no acute process within the abdomen or pelvis. Electronically Signed   By: Kenard Gower  Earlene Plater M.D.   On: 07/22/2021 12:13   US RENAL  Result Date: 07/22/2021 CLINICAL DATA:  Abnormal urologic system. EXAM: RENAL / URINARY TRACT ULTRASOUND COMPLETE COMPARISON:  CT abdomen pelvis dated 07/22/2021 and renal ultrasound dated 01/30/2017. FINDINGS: Right Kidney: Renal measurements: 11.6 x 6.1 x 5.4 cm = volume: 200 mL. Echogenicity is increased. No mass or hydronephrosis visualized. Left Kidney: Renal measurements: 10.1 x 6.1 x 5.8 cm = volume: 187 mL. Echogenicity is increased. No mass or hydronephrosis visualized. Bladder: Appears normal for degree of bladder distention. Other: The exam is limited due to patient body habitus. IMPRESSION: No hydronephrosis. Increased renal cortical echogenicity is consistent with chronic medical renal  disease. Electronically Signed   By: Romona Curls M.D.   On: 07/22/2021 15:23   US Venous Img Lower Unilateral Left  Result Date: 08/15/2021 CLINICAL DATA:  65 year old male with leg swelling EXAM: LEFT LOWER EXTREMITY VENOUS DOPPLER ULTRASOUND TECHNIQUE: Gray-scale sonography with graded compression, as well as color Doppler and duplex ultrasound were performed to evaluate the lower extremity deep venous systems from the level of the common femoral vein and including the common femoral, femoral, profunda femoral, popliteal and calf veins including the posterior tibial, peroneal and gastrocnemius veins when visible. The superficial great saphenous vein was also interrogated. Spectral Doppler was utilized to evaluate flow at rest and with distal augmentation maneuvers in the common femoral, femoral and popliteal veins. COMPARISON:  None. FINDINGS: Contralateral Common Femoral Vein: Respiratory phasicity is normal and symmetric with the symptomatic side. No evidence of thrombus. Normal compressibility. Common Femoral Vein: No evidence of thrombus. Normal compressibility, respiratory phasicity and response to augmentation. Saphenofemoral Junction: No evidence of thrombus. Normal compressibility and flow on color Doppler imaging. Profunda Femoral Vein: No evidence of thrombus. Normal compressibility and flow on color Doppler imaging. Femoral Vein: No evidence of thrombus. Normal compressibility, respiratory phasicity and response to augmentation. Popliteal Vein: No evidence of thrombus. Normal compressibility, respiratory phasicity and response to augmentation. Calf Veins: No evidence of thrombus. Normal compressibility and flow on color Doppler imaging. Superficial Great Saphenous Vein: No evidence of thrombus. Normal compressibility and flow on color Doppler imaging. Other Findings:  Edema IMPRESSION: Sonographic survey of the left lower extremity negative for DVT Electronically Signed   By: Gilmer Mor D.O.    On: 08/15/2021 15:53   DG Chest Port 1 View  Result Date: 07/22/2021 CLINICAL DATA:  Altered mental status, unresponsive, fever, sepsis evaluation EXAM: PORTABLE CHEST 1 VIEW COMPARISON:  03/02/2021 FINDINGS: Low lung volumes with vascular prominence and basilar atelectasis. Heart is enlarged. No focal pneumonia, acute airspace process, effusion, or edema. No pneumothorax. Trachea midline. Degenerative changes of the spine. IMPRESSION: Low volume exam with cardiomegaly and basilar atelectasis. Electronically Signed   By: Judie Petit.  Shick M.D.   On: 07/22/2021 10:35   ECHOCARDIOGRAM COMPLETE  Result Date: 07/24/2021    ECHOCARDIOGRAM REPORT   Patient Name:   Brian Dodson Date of Exam: 07/23/2021 Medical Rec #:  [id number]     Height:       72.0 in Accession #:    [id number]    Weight:       220.0 lb Date of Birth:  1956/09/06    BSA:          2.219 m Patient Age:    64 years      BP:           138/96 mmHg Patient Gender: M  HR:           47 bpm. Exam Location:  [facility] Procedure: 2D Echo Indications:     Bactermia R78.81  History:         Patient has prior history of Echocardiogram examinations, most                  recent 03/03/2021.  Sonographer:     Overton Mam RDCS Referring Phys:  [id number] Earl Lites DANFORD Diagnosing Phys: Arnoldo Hooker MD IMPRESSIONS  1. Left ventricular ejection fraction, by estimation, is 60 to 65%. The left ventricle has normal function. The left ventricle has no regional wall motion abnormalities. Left ventricular diastolic parameters were normal.  2. Right ventricular systolic function is normal. The right ventricular size is normal.  3. The mitral valve is normal in structure. Trivial mitral valve regurgitation.  4. The aortic valve is normal in structure. Aortic valve regurgitation is trivial. FINDINGS  Left Ventricle: Left ventricular ejection fraction, by estimation, is 60 to 65%. The left ventricle has normal function. The left ventricle has no regional wall motion  abnormalities. The left ventricular internal cavity size was small. There is no left ventricular hypertrophy. Left ventricular diastolic parameters were normal. Right Ventricle: The right ventricular size is normal. No increase in right ventricular wall thickness. Right ventricular systolic function is normal. Left Atrium: Left atrial size was normal in size. Right Atrium: Right atrial size was normal in size. Pericardium: There is no evidence of pericardial effusion. Mitral Valve: The mitral valve is normal in structure. Trivial mitral valve regurgitation. Tricuspid Valve: The tricuspid valve is normal in structure. Tricuspid valve regurgitation is trivial. Aortic Valve: The aortic valve is normal in structure. Aortic valve regurgitation is trivial. Aortic valve peak gradient measures 3.5 mmHg. Pulmonic Valve: The pulmonic valve was normal in structure. Pulmonic valve regurgitation is not visualized. Aorta: The aortic root and ascending aorta are structurally normal, with no evidence of dilitation. IAS/Shunts: No atrial level shunt detected by color flow Doppler.  LEFT VENTRICLE PLAX 2D LVIDd:         4.55 cm  Diastology LVIDs:         3.10 cm  LV e' medial:    9.14 cm/s LV PW:         1.10 cm  LV E/e' medial:  8.5 LV IVS:        1.35 cm  LV e' lateral:   15.90 cm/s LVOT diam:     2.60 cm  LV E/e' lateral: 4.9 LV SV:         65 LV SV Index:   29 LVOT Area:     5.31 cm  RIGHT VENTRICLE RV Basal diam:  4.00 cm RV S prime:     14.90 cm/s TAPSE (M-mode): 2.7 cm LEFT ATRIUM             Index       RIGHT ATRIUM           Index LA diam:        4.20 cm 1.89 cm/m  RA Area:     24.80 cm LA Vol (A2C):   53.2 ml 23.98 ml/m RA Volume:   97.60 ml  43.99 ml/m LA Vol (A4C):   32.1 ml 14.47 ml/m LA Biplane Vol: 42.4 ml 19.11 ml/m  AORTIC VALVE                PULMONIC VALVE AV Area (Vmax): 3.70 cm    PV Vmax:  0.99 m/s AV Vmax:        93.70 cm/s  PV Peak grad:  3.9 mmHg AV Peak Grad:   3.5 mmHg LVOT Vmax:      65.30  cm/s LVOT Vmean:     46.700 cm/s LVOT VTI:       0.122 m  AORTA Ao Root diam: 3.50 cm Ao Asc diam:  3.20 cm MITRAL VALVE MV Area (PHT): 2.76 cm    SHUNTS MV Decel Time: 275 msec    Systemic VTI:  0.12 m MV E velocity: 78.00 cm/s  Systemic Diam: 2.60 cm Arnoldo Hooker MD Electronically signed by Arnoldo Hooker MD Signature Date/Time: 07/24/2021/6:34:31 AM    Final     EKG: Sinus bradycardia 43 bpm  ASSESSMENT AND PLAN:   1.  Chest pain, with atypical features, in patient who has prior history of CVA and vascular dementia, a poor historian, with nondiagnostic ECG, troponin pending 2.  Coronary artery disease, status post stent 2003, nondiagnostic ECG, troponin pending 3.  Sinus bradycardia, ECG reveals heart rate 43 bpm, telemetry reveals heart rate 63 bpm, currently clinically hemodynamically stable, on carvedilol and amiodarone 4.  Paroxysmal atrial fibrillation, on Eliquis for stroke prevention, and amiodarone for rate and rhythm control 5.  Left lower extremity cellulitis, on Rocephin  Recommendations  1.  Agree with overall current therapy 2.  Would defer heparin 3.  Continue Eliquis for stroke prevention 4.  Decrease amiodarone 200 mg daily 5.  No indication for temporary or permanent pacemaker at this time, especially in the setting of cellulitis 6.  Defer further cardiac diagnostics at this time  Signed: Marcina Millard MD,PhD, [facility] 08/15/2021, 5:07 PM

## 2021-08-15 NOTE — ED Triage Notes (Signed)
Pt comes into the ED via EMS from [facility] with c/o infection of the LLE, reported pt has failed with PO antibiotics   HR52 100%RA 113/69 [id number]

## 2021-08-15 NOTE — ED Notes (Signed)
Assisted pt with urinal

## 2021-08-15 NOTE — ED Provider Notes (Signed)
Emergency Medicine Provider Triage Evaluation Note  Brian Dodson , a 65 y.o. male  was evaluated in triage.  Pt complains of failed PO abx. He presents from the SNF with LLE infection.   Review of Systems  Positive: cellulitis Negative: NVD  Physical Exam  There were no vitals taken for this visit. Gen:   Awake, no distress  NAD Resp:  Normal effort CTA MSK:   Moves extremities without difficulty. BLE edema Other:  CVS: RRR  Medical Decision Making  Medically screening exam initiated at 11:15 AM.  Appropriate orders placed.  Brian Dodson was informed that the remainder of the evaluation will be completed by another provider, this initial triage assessment does not replace that evaluation, and the importance of remaining in the ED until their evaluation is complete.  Patient from SNF with c/o LLE cellulitis on PO Abx.    Lissa Hoard, PA-C 08/15/21 1120    [name], MD 08/15/21 [phone]

## 2021-08-15 NOTE — H&P (Addendum)
History and Physical   Brian Dodson LHT:[id number] DOB: September 07, 1956 DOA: 08/15/2021  PCP: Galvin Proffer, MD  Outpatient Specialists: Dr. Juliann Pares Patient coming from: Facility  I have personally briefly reviewed patient's old medical records in [facility] EMR.  Chief Concern: Left lower extremity wound  HPI: Brian Dodson is a 65 y.o. male with medical history significant for atrial fibrillation, hypertension, non-insulin-dependent diabetes mellitus, early dementia/memory deficits, history of schizophrenia, is at a long-term nursing home, presents emergency department for chief concerns of left lower extremity wound.  At bedside patient was only able to tell me his name is Brian Dodson.  He is not able to tell me his last name, current age, current calendar year, or his current location.  He asked me if I was his girlfriend.  He smiles and giggles.  This appears to be his baseline mental status.  He denies having any pain, acute distress, nausea, vomiting.  Upon further evaluation, as patient developed bradycardia at bedside, he then tells me that he is having aching in his right chest.  He points to his right pectoral muscle region.  He states that sometimes he feels like his heart skips.    He denies any associated shortness of breath.  Though my history with him is very limited given that patient has baseline early dementia/learning disability.  Social history: He lives at long-term facility.  Vaccination history: Unknown  ROS: Unable to complete due to memory deficits/early dementia  ED Course: Discussed with EDP, patient requiring hospitalization for chief concerns of cellulitis failing outpatient therapy.  Vitals in the emergency department was remarkable for temperature 98, respiration rate of 17, heart rate of 92, initial blood pressure 257/229, and improved to 127/70, SPO2 of 100% on room air.  Blood cultures x2 were obtained.  Labs in the emergency department was remarkable for  sodium 141, potassium to 4.2, chloride 103, bicarb 29, BUN 33, serum creatinine of 1.02, nonfasting blood glucose 76, GFR greater than 60.  Lactic acid 1.6. WBC 5.5, hemoglobin 10.1, platelets 181.  In the emergency department patient received ceftriaxone 2 g IV.  Assessment/Plan  Principal Problem:   Cellulitis Active Problems:   Schizophrenia (HCC)   Diabetes mellitus, type II (HCC)   HTN (hypertension)   GERD (gastroesophageal reflux disease)   CAD (coronary artery disease)   Loss of memory   Anemia of chronic disease   Dementia (HCC)   AF (paroxysmal atrial fibrillation) (HCC)   Diabetes mellitus type 2, controlled, without complications (HCC)   # Left lower extremity cellulitis-failing outpatient therapy - Ceftriaxone 2 g IV initiated - Image in media - Blood cultures x2 ordered - Wound care consulted  # Atrial fibrillation-resumed home amiodarone 100 mg daily - Eliquis 5 mg p.o. twice daily resumed  # Bradycardia-symptomatic bradycardia - Etiology unclear at this time however I suspect this may be secondary to cellulitis - Check procalcitonin - Cardiology was consulted - Sick sinus/t tachycardia bradycardia cannot be excluded however given acute left lower extremity infection, patient is not a candidate for left heart cath at this time and or a candidate for ICD placement given increased risk of infection and or endocarditis per cardiology and I agree - Hydralazine 5 mg IV every 6 hours as needed for SBP greater than 170, 2 days ordered - MedSurg, observation, telemetry ordered for 24 hours  # Hypertension-losartan 25 mg daily resumed  # Learning disability/early dementia-appears to be at baseline at this time - Donepezil 10 mg nightly, risperidone  4 mg nightly resumed - Benztropine 1 mg nightly resumed  # Depression/anxiety-citalopram 20 mg daily resumed  # Psychiatric and balance-Depakote 2000 mg nightly resumed  Chart reviewed.   07/23/2021: Echo was read as  60 to 65%, left ventricle has normal function.  Left ventricular diastolic parameters were normal.  Right ventricular size is normal.  DVT prophylaxis: Eliquis 5 mg p.o. twice daily Code Status: Full code Diet: Heart healthy Family Communication: No Disposition Plan: Pending clinical course Consults called: Cardiology Admission status: MedSurg, observation, telemetry  Past Medical History:  Diagnosis Date   Asthma    Atrial fibrillation (HCC)    Benign prostatic hypertrophy with lower urinary tract symptoms (LUTS)    Bulbous urethral stricture    CAD (coronary artery disease)    s/p coronary stent 2003   Constipation    COPD (chronic obstructive pulmonary disease) (HCC)    Diabetes mellitus without complication (HCC)    Dizziness    Dyspnea    GERD (gastroesophageal reflux disease)    Gross hematuria    Hyperlipemia    Hypertension    Lumbago    Palpitation    Schizophrenia (HCC)    Past Surgical History:  Procedure Laterality Date   COLONOSCOPY     CORONARY ANGIOPLASTY WITH STENT PLACEMENT  11/2003   kidney stent     Social History:  reports that he has quit smoking. He has never used smokeless tobacco. He reports that he does not drink alcohol and does not use drugs.  Allergies  Allergen Reactions   Sulfa Antibiotics Other (See Comments)    Unknown reaction   Xarelto [Rivaroxaban]    Family History  Problem Relation Age of Onset   Diabetes Other    Bladder Cancer Neg Hx    Kidney cancer Neg Hx    Prostate cancer Neg Hx    Family history: Family history reviewed and not pertinent  Prior to Admission medications   Medication Sig Start Date End Date Taking? Authorizing Provider  amiodarone (PACERONE) 200 MG tablet Take 1 tablet (200 mg total) by mouth daily. 07/26/21  Yes Charise Killian, MD  aspirin 81 MG tablet Take 81 mg by mouth daily.    Yes [provider]  atorvastatin (LIPITOR) 10 MG tablet Take 10 mg by mouth daily. 05/18/15  Yes [provider]  benztropine (COGENTIN) 1 MG tablet Take 1 mg by mouth at bedtime.    Yes [provider]  brimonidine-timolol (COMBIGAN) 0.2-0.5 % ophthalmic solution Place 1 drop into both eyes every 12 (twelve) hours. Wait 3 to 5 minutes between drops   Yes [provider]  carvedilol (COREG) 6.25 MG tablet Take 6.25 mg by mouth 2 (two) times daily with a meal. 07/05/21  Yes [provider]  Cholecalciferol (EQL VITAMIN D3) 50 MCG (2000 UT) CAPS Take 1 capsule by mouth daily.   Yes [provider]  citalopram (CELEXA) 20 MG tablet Take 20 mg by mouth daily.   Yes [provider]  clindamycin (CLEOCIN) 300 MG capsule Take 300 mg by mouth 3 (three) times daily. 08/11/21  Yes [provider]  diclofenac Sodium (VOLTAREN) 1 % GEL Apply 2 g topically 4 (four) times daily. Apply to neck and shoulders   Yes [provider]  divalproex (DEPAKOTE ER) 500 MG 24 hr tablet Take 2,000 mg by mouth at bedtime.    Yes [provider]  donepezil (ARICEPT) 10 MG tablet Take 10 mg by mouth at bedtime.  Yes [provider]  ELIQUIS 5 MG TABS tablet Take 5 mg by mouth 2 (two) times daily. 05/18/15  Yes [provider]  ferrous sulfate 325 (65 FE) MG tablet Take 325 mg by mouth daily with breakfast.   Yes [provider]  lanolin/mineral oil (KERI/THERA-DERM) LOTN Apply 1 application topically as needed for dry skin. Apply to face after washing face daily   Yes [provider]  losartan (COZAAR) 25 MG tablet Take 1 tablet (25 mg total) by mouth daily. May increase to 2 tablets by mouth daily if blood pressure goes above 140/90 for more than a day  or if the blood pressure goes above 150/100 at all. Patient taking differently: Take 25 mg by mouth daily. 11/18/18 05/03/22 Yes Arnaldo Natal, MD  LUMIGAN 0.01 % SOLN Place 1 drop into both eyes at bedtime. 07/06/21  Yes [provider]  melatonin 5 MG TABS Take  5 mg by mouth at bedtime.   Yes [provider]  metFORMIN (GLUCOPHAGE) 500 MG tablet Take 500 mg by mouth 2 (two) times daily with a meal.   Yes [provider]  omeprazole (PRILOSEC) 20 MG capsule Take 20 mg by mouth 2 (two) times daily before a meal. May open and sprinkle in applesauce.   Yes [provider]  [name] (OYSTER CALCIUM) 500 MG TABS tablet Take 500 mg of elemental calcium by mouth 2 (two) times daily.   Yes [provider]  risperidone (RISPERDAL) 4 MG tablet Take 4 mg by mouth at bedtime. 05/18/15  Yes [provider]  vitamin C (ASCORBIC ACID) 250 MG tablet Take 1 tablet (250 mg total) by mouth daily. 03/05/21  Yes Drema Dallas, MD  acetaminophen (TYLENOL) 500 MG tablet Take 1,000 mg by mouth every 6 (six) hours as needed for mild pain. Patient not taking: Reported on 07/22/2021    [provider]  amLODipine (NORVASC) 5 MG tablet Take 5 mg by mouth daily. Patient not taking: Reported on 07/22/2021    [provider]  divalproex (DEPAKOTE) 500 MG DR tablet Take 1,000 mg by mouth 2 (two) times daily. Patient not taking: Reported on 08/15/2021 08/03/21   [provider]  furosemide (LASIX) 20 MG tablet Take 20 mg by mouth daily as needed. For edema (swelling) 06/22/21   [provider]  latanoprost (XALATAN) 0.005 % ophthalmic solution Place 1 drop into both eyes at bedtime. Patient not taking: Reported on 07/22/2021    [provider]  Menthol, Topical Analgesic, (BIOFREEZE) 4 % GEL Apply 1 application topically 3 (three) times daily. To lower back area as needed for pain    [provider]   Physical Exam: Vitals:   08/15/21 1344 08/15/21 1431 08/15/21 1517 08/15/21 1618  BP: 115/75 134/85 127/70 127/85  Pulse: (!) 57 (!) 58 (!) 50 (!) 37  Resp: 11 13 17 11   Temp:      TempSrc:      SpO2: 99% 100% 93% 99%   Constitutional: appears age-appropriate, NAD, calm, comfortable Eyes:  PERRL, lids and conjunctivae normal ENMT: Mucous membranes are moist. Posterior pharynx clear of any exudate or lesions. Age-appropriate dentition. Hearing appropriate Neck: normal, supple, no masses, no thyromegaly Respiratory: clear to auscultation bilaterally, no wheezing, no crackles. Normal respiratory effort. No accessory muscle use.  Cardiovascular: Regular rate and rhythm, no murmurs / rubs / gallops. No extremity edema. 2+ pedal pulses. No carotid bruits.  Abdomen: no tenderness, no masses palpated, no  hepatosplenomegaly. Bowel sounds positive.  Musculoskeletal: no clubbing / cyanosis. No joint deformity upper and lower extremities. Good ROM, no contractures, no atrophy. Normal muscle tone.  Skin: + lesion/wound, + edema, + erythema   Neurologic: Sensation intact. Strength 5/5 in all 4.  Psychiatric: Lacks judgment and insight. Alert and oriented x self. Normal mood.   EKG: independently reviewed, showing sinus bradycardia with rate of 43, QTc 430  Chest x-ray on Admission: I personally reviewed and there is no x-ray evidence of cardio pulmonary acute pathology.   US Venous Img Lower Unilateral Left  Result Date: 08/15/2021 CLINICAL DATA:  65 year old male with leg swelling EXAM: LEFT LOWER EXTREMITY VENOUS DOPPLER ULTRASOUND TECHNIQUE: Gray-scale sonography with graded compression, as well as color Doppler and duplex ultrasound were performed to evaluate the lower extremity deep venous systems from the level of the common femoral vein and including the common femoral, femoral, profunda femoral, popliteal and calf veins including the posterior tibial, peroneal and gastrocnemius veins when visible. The superficial great saphenous vein was also interrogated. Spectral Doppler was utilized to evaluate flow at rest and with distal augmentation maneuvers in the common femoral, femoral and popliteal veins. COMPARISON:  None. FINDINGS: Contralateral Common Femoral Vein: Respiratory phasicity is  normal and symmetric with the symptomatic side. No evidence of thrombus. Normal compressibility. Common Femoral Vein: No evidence of thrombus. Normal compressibility, respiratory phasicity and response to augmentation. Saphenofemoral Junction: No evidence of thrombus. Normal compressibility and flow on color Doppler imaging. Profunda Femoral Vein: No evidence of thrombus. Normal compressibility and flow on color Doppler imaging. Femoral Vein: No evidence of thrombus. Normal compressibility, respiratory phasicity and response to augmentation. Popliteal Vein: No evidence of thrombus. Normal compressibility, respiratory phasicity and response to augmentation. Calf Veins: No evidence of thrombus. Normal compressibility and flow on color Doppler imaging. Superficial Great Saphenous Vein: No evidence of thrombus. Normal compressibility and flow on color Doppler imaging. Other Findings:  Edema IMPRESSION: Sonographic survey of the left lower extremity negative for DVT Electronically Signed   By: Gilmer Mor D.O.   On: 08/15/2021 15:53   DG Chest Port 1 View  Result Date: 08/15/2021 CLINICAL DATA:  chest pain, palpitations, and bradycardia. Patient presents from nursing home with swelling and redness of left leg which was treated as an outpatient with amoxicillin EXAM: PORTABLE CHEST 1 VIEW COMPARISON:  Chest x-ray 07/22/2021 FINDINGS: The heart and mediastinal contours are unchanged. No focal consolidation. No pulmonary edema. No pleural effusion. No pneumothorax. No acute osseous abnormality. IMPRESSION: No active disease. Electronically Signed   By: Tish Frederickson M.D.   On: 08/15/2021 19:00    Labs on Admission: I have personally reviewed following labs CBC: Recent Labs  Lab 08/15/21 1139  WBC 5.5  NEUTROABS 2.3  HGB 10.1*  HCT 30.2*  MCV 103.1*  PLT 181   Basic Metabolic Panel: Recent Labs  Lab 08/15/21 1139  NA 141  K 4.2  CL 103  CO2 29  GLUCOSE 76  BUN 33*  CREATININE 1.02  CALCIUM  9.2   GFR: CrCl cannot be calculated (Unknown ideal weight.).  Liver Function Tests: Recent Labs  Lab 08/15/21 1139  AST 18  ALT 11  ALKPHOS 55  BILITOT 0.5  PROT 7.1  ALBUMIN 3.4*   Coagulation Profile: Recent Labs  Lab 08/15/21 1139  INR 1.0   Urine analysis:    Component Value Date/Time   COLORURINE YELLOW (A) 08/15/2021 1748   APPEARANCEUR CLEAR (A) 08/15/2021 1748   APPEARANCEUR Clear 03/14/2016  1027   LABSPEC 1.033 (H) 08/15/2021 1748   LABSPEC 1.017 01/29/2014 1545   PHURINE 5.0 08/15/2021 1748   GLUCOSEU NEGATIVE 08/15/2021 1748   GLUCOSEU Negative 01/29/2014 1545   HGBUR NEGATIVE 08/15/2021 1748   BILIRUBINUR NEGATIVE 08/15/2021 1748   BILIRUBINUR Negative 03/14/2016 1027   BILIRUBINUR Negative 01/29/2014 1545   KETONESUR 5 (A) 08/15/2021 1748   PROTEINUR NEGATIVE 08/15/2021 1748   NITRITE NEGATIVE 08/15/2021 1748   LEUKOCYTESUR NEGATIVE 08/15/2021 1748   LEUKOCYTESUR Negative 01/29/2014 1545   CRITICAL CARE Performed by: Nadyne Coombes Denyse Fillion  Total critical care time: 35 minutes  Critical care time was exclusive of separately billable procedures and treating other patients.  Critical care was necessary to treat or prevent imminent or life-threatening deterioration.  Circulatory failure  Critical care was time spent personally by me on the following activities: development of treatment plan with patient and/or surrogate as well as nursing, discussions with consultants, evaluation of patient's response to treatment, examination of patient, obtaining history from patient or surrogate, ordering and performing treatments and interventions, ordering and review of laboratory studies, ordering and review of radiographic studies, pulse oximetry and re-evaluation of patient's condition.  Dr. Sedalia Muta [facility]  If 7PM-7AM, please contact overnight-coverage provider If 7AM-7PM, please contact day coverage provider [email or web address]  08/15/2021, 7:19 PM

## 2021-08-16 ENCOUNTER — Encounter: Payer: Self-pay | Admitting: Internal Medicine

## 2021-08-16 DIAGNOSIS — F209 Schizophrenia, unspecified: Secondary | ICD-10-CM | POA: Diagnosis present

## 2021-08-16 DIAGNOSIS — E785 Hyperlipidemia, unspecified: Secondary | ICD-10-CM | POA: Diagnosis present

## 2021-08-16 DIAGNOSIS — I10 Essential (primary) hypertension: Secondary | ICD-10-CM | POA: Diagnosis present

## 2021-08-16 DIAGNOSIS — F419 Anxiety disorder, unspecified: Secondary | ICD-10-CM | POA: Diagnosis present

## 2021-08-16 DIAGNOSIS — R001 Bradycardia, unspecified: Secondary | ICD-10-CM | POA: Diagnosis present

## 2021-08-16 DIAGNOSIS — D638 Anemia in other chronic diseases classified elsewhere: Secondary | ICD-10-CM | POA: Diagnosis present

## 2021-08-16 DIAGNOSIS — F0150 Vascular dementia without behavioral disturbance: Secondary | ICD-10-CM | POA: Diagnosis present

## 2021-08-16 DIAGNOSIS — Z833 Family history of diabetes mellitus: Secondary | ICD-10-CM | POA: Diagnosis not present

## 2021-08-16 DIAGNOSIS — L03116 Cellulitis of left lower limb: Secondary | ICD-10-CM | POA: Diagnosis present

## 2021-08-16 DIAGNOSIS — Z888 Allergy status to other drugs, medicaments and biological substances status: Secondary | ICD-10-CM | POA: Diagnosis not present

## 2021-08-16 DIAGNOSIS — Z955 Presence of coronary angioplasty implant and graft: Secondary | ICD-10-CM | POA: Diagnosis not present

## 2021-08-16 DIAGNOSIS — J449 Chronic obstructive pulmonary disease, unspecified: Secondary | ICD-10-CM | POA: Diagnosis present

## 2021-08-16 DIAGNOSIS — F819 Developmental disorder of scholastic skills, unspecified: Secondary | ICD-10-CM | POA: Diagnosis present

## 2021-08-16 DIAGNOSIS — Z79899 Other long term (current) drug therapy: Secondary | ICD-10-CM | POA: Diagnosis not present

## 2021-08-16 DIAGNOSIS — K219 Gastro-esophageal reflux disease without esophagitis: Secondary | ICD-10-CM | POA: Diagnosis present

## 2021-08-16 DIAGNOSIS — I2510 Atherosclerotic heart disease of native coronary artery without angina pectoris: Secondary | ICD-10-CM | POA: Diagnosis present

## 2021-08-16 DIAGNOSIS — I480 Paroxysmal atrial fibrillation: Secondary | ICD-10-CM | POA: Diagnosis present

## 2021-08-16 DIAGNOSIS — Z87891 Personal history of nicotine dependence: Secondary | ICD-10-CM | POA: Diagnosis not present

## 2021-08-16 DIAGNOSIS — N400 Enlarged prostate without lower urinary tract symptoms: Secondary | ICD-10-CM | POA: Diagnosis present

## 2021-08-16 DIAGNOSIS — E119 Type 2 diabetes mellitus without complications: Secondary | ICD-10-CM | POA: Diagnosis present

## 2021-08-16 DIAGNOSIS — F32A Depression, unspecified: Secondary | ICD-10-CM | POA: Diagnosis present

## 2021-08-16 DIAGNOSIS — Z882 Allergy status to sulfonamides status: Secondary | ICD-10-CM | POA: Diagnosis not present

## 2021-08-16 DIAGNOSIS — L0390 Cellulitis, unspecified: Secondary | ICD-10-CM | POA: Diagnosis present

## 2021-08-16 DIAGNOSIS — Z20822 Contact with and (suspected) exposure to covid-19: Secondary | ICD-10-CM | POA: Diagnosis present

## 2021-08-16 DIAGNOSIS — R4189 Other symptoms and signs involving cognitive functions and awareness: Secondary | ICD-10-CM | POA: Diagnosis present

## 2021-08-16 LAB — CBC
HCT: 34 % — ABNORMAL LOW (ref 39.0–52.0)
Hemoglobin: 11.1 g/dL — ABNORMAL LOW (ref 13.0–17.0)
MCH: 33.7 pg (ref 26.0–34.0)
MCHC: 32.6 g/dL (ref 30.0–36.0)
MCV: 103.3 fL — ABNORMAL HIGH (ref 80.0–100.0)
Platelets: 188 10*3/uL (ref 150–400)
RBC: 3.29 MIL/uL — ABNORMAL LOW (ref 4.22–5.81)
RDW: 13.9 % (ref 11.5–15.5)
WBC: 6.7 10*3/uL (ref 4.0–10.5)
nRBC: 0 % (ref 0.0–0.2)

## 2021-08-16 LAB — BASIC METABOLIC PANEL
Anion gap: 8 (ref 5–15)
BUN: 27 mg/dL — ABNORMAL HIGH (ref 8–23)
CO2: 29 mmol/L (ref 22–32)
Calcium: 9 mg/dL (ref 8.9–10.3)
Chloride: 103 mmol/L (ref 98–111)
Creatinine, Ser: 1.03 mg/dL (ref 0.61–1.24)
GFR, Estimated: >60 mL/min (ref >60)
Glucose, Bld: 97 mg/dL (ref 70–99)
Potassium: 4.4 mmol/L (ref 3.5–5.1)
Sodium: 140 mmol/L (ref 135–145)

## 2021-08-16 LAB — SARS CORONAVIRUS 2 (TAT 6-24 HRS): SARS Coronavirus 2: NEGATIVE

## 2021-08-16 MED ORDER — CARVEDILOL 3.125 MG PO TABS
3.1250 mg | ORAL_TABLET | Freq: Two times a day (BID) | ORAL | Status: DC
  Administered 2021-08-17 – 2021-08-18 (×3): 3.125 mg via ORAL
  Filled 2021-08-16 (×4): qty 1

## 2021-08-16 NOTE — ED Notes (Signed)
Pt ambulated to BR with steady gait. Tolerated well. Pt linen changed, pt given sponge bath with wipes. Fresh gown placed on pt. Pt denies further needs at this time.

## 2021-08-16 NOTE — TOC Initial Note (Addendum)
Transition of Care [facility]) - Initial/Assessment Note    Patient Details  Name: Brian Dodson MRN: [id number] Date of Birth: [birth year]  [facility] CM/SW Contact:    Marina Goodell Phone Number: [phone] 08/16/2021, 9:25 AM  Clinical Narrative:                  Patient presents to [facility] due to infection. CSW received a call from [facility], who stated the RNs refused to give her information on the patient status.  CSW stated I would contact patient's guardian Anselmo Pickler [facility] DSS [phone] and request permission.  CSW left voicemail for Ms. Gobble, request a return call.  Patient was discharge from [facility] on 07/26/2021.  CSW requested TOC consult.  Expected Discharge Plan: Skilled Nursing Facility Barriers to Discharge: Continued Medical Work up, SNF Pending bed offer   Patient Goals and CMS Choice   CMS Medicare.gov Compare Post Acute Care list provided to:: Legal Guardian Astronomer (Legal Guardian)   [phone] (Work Phone))    Expected Discharge Plan and Services Expected Discharge Plan: Skilled Nursing Facility       Living arrangements for the past 2 months: Assisted Living Facility ([facility])                                      Prior Living Arrangements/Services Living arrangements for the past 2 months: Assisted Living Facility ([facility]) Lives with:: Facility Resident Patient language and need for interpreter reviewed:: Yes Do you feel safe going back to the place where you live?: Yes      Need for Family Participation in Patient Care: Yes (Comment)        Activities of Daily Living      Permission Sought/Granted Permission sought to share information with : Guardian    Share Information with NAME: Gobble,Candice Doctor, hospital Guardian)   [phone] (Work Phone)           Emotional Assessment Appearance:: Appears stated  age Attitude/Demeanor/Rapport: Unable to Assess Affect (typically observed): Unable to Assess   Alcohol / Substance Use: Not Applicable Psych Involvement: No (comment)  Admission diagnosis:  Cellulitis [L03.90] Patient Active Problem List   Diagnosis Date Noted   Cellulitis 08/15/2021   Sepsis secondary to UTI (HCC) 03/05/2021   Acute metabolic encephalopathy 03/05/2021   Dementia (HCC) 03/05/2021   Memory loss due to medical condition 03/05/2021   AF (paroxysmal atrial fibrillation) (HCC) 03/05/2021   Atrial flutter (HCC) 03/05/2021   Diabetes mellitus type 2, controlled, without complications (HCC) 03/05/2021   Obese 03/05/2021   Anemia of chronic disease 03/04/2021   Acute encephalopathy 03/02/2021   Acute lower UTI 03/02/2021   Unspecified atrial fibrillation (HCC) 03/02/2021   Acute febrile illness    Bilateral lower extremity edema 04/01/2018   Lower extremity pain, bilateral 04/01/2018   Swelling of limb 10/04/2017   Sepsis (HCC) 05/20/2017   Anemia 05/07/2017   B12 deficiency 05/07/2017   Loss of memory 05/07/2017   Atrial fibrillation with RVR (HCC) 05/23/2015   Syncope 05/23/2015   Schizophrenia (HCC) 05/23/2015   Diabetes mellitus, type II (HCC) 05/23/2015   HTN (hypertension) 05/23/2015   GERD (gastroesophageal reflux disease) 05/23/2015   CAD (coronary artery disease) 05/23/2015   PCP:  Galvin Proffer, MD Pharmacy:   [facility] - [place], [place] - [address]  [address] [place] [address] Phone: [phone] Fax: [phone]  [facility] - [place], [place] - [address] [place] [address] Phone: [phone] Fax: [phone]     Social Determinants of Health (SDOH) Interventions    Readmission Risk Interventions No flowsheet data found.

## 2021-08-16 NOTE — ED Notes (Signed)
Pt provided orange sorbet and water.

## 2021-08-16 NOTE — ED Notes (Signed)
Pt given meal at this time.

## 2021-08-16 NOTE — ED Notes (Signed)
Pt brief changed and chux changed by Colin Mulders RN

## 2021-08-16 NOTE — Consult Note (Signed)
WOC Nurse wound consult note Consultation was completed by review of records, images and assistance from the bedside nurse/clinical staff.   Reason for Consult: Left leg wound Followed outpatient per notes, not able to access any documentation from outside facility Wound type: full thickness lesion  Pressure Injury POA: NA Measurement:see nursing flow sheet once admitted to the floor Wound bed:100% pink, clean, pale Drainage (amount, consistency, odor) not able to assess, no dressing in images  Periwound: intact  Dressing procedure/placement/frequency:  Silver hydrofiber to wound bed, possible bioburden that has stalled wound healing Top with foam dressing Change every other day.    Re consult if needed, will not follow at this time. Thanks  Ebony Yorio [employer], RN,CWOCN, CNS, CWON-AP [phone])

## 2021-08-16 NOTE — ED Notes (Signed)
Pt was up walking around trying to clean himself. Pt reoriented to use call bell for help. Pt ripped off vs equipment attempting to get up. Pt brief changed, new gown, new chucks and cleaned room. Pt now resting comfortably, and call light within reach.

## 2021-08-16 NOTE — Progress Notes (Signed)
[facility] Cardiology  SUBJECTIVE: Patient laying in bed, denies chest pain   Vitals:   08/16/21 0430 08/16/21 0530 08/16/21 0600 08/16/21 0805  BP: 102/83 133/84 98/77 104/76  Pulse: 90 72 64 80  Resp: 16 16 14 18   Temp:    97.8 F (36.6 C)  TempSrc:    Oral  SpO2: 97% 96% 99% 100%     Intake/Output Summary (Last 24 hours) at 08/16/2021 0854 Last data filed at 08/16/2021 08/18/2021 Gross per 24 hour  Intake --  Output 975 ml  Net -975 ml      PHYSICAL EXAM  General: Well developed, well nourished, in no acute distress HEENT:  Normocephalic and atramatic Neck:  No JVD.  Lungs: Clear bilaterally to auscultation and percussion. Heart: HRRR . Normal S1 and S2 without gallops or murmurs.  Abdomen: Bowel sounds are positive, abdomen soft and non-tender  Msk:  Back normal, normal gait. Normal strength and tone for age. Extremities: No clubbing, cyanosis or edema.   Neuro: Alert and oriented X 3. Psych:  Good affect, responds appropriately   LABS: Basic Metabolic Panel: Recent Labs    08/15/21 1139 08/16/21 0517  NA 141 140  K 4.2 4.4  CL 103 103  CO2 29 29  GLUCOSE 76 97  BUN 33* 27*  CREATININE 1.02 1.03  CALCIUM 9.2 9.0   Liver Function Tests: Recent Labs    08/15/21 1139  AST 18  ALT 11  ALKPHOS 55  BILITOT 0.5  PROT 7.1  ALBUMIN 3.4*   No results for input(s): LIPASE, AMYLASE in the last 72 hours. CBC: Recent Labs    08/15/21 1139 08/16/21 0517  WBC 5.5 6.7  NEUTROABS 2.3  --   HGB 10.1* 11.1*  HCT 30.2* 34.0*  MCV 103.1* 103.3*  PLT 181 188   Cardiac Enzymes: No results for input(s): CKTOTAL, CKMB, CKMBINDEX, TROPONINI in the last 72 hours. BNP: Invalid input(s): POCBNP D-Dimer: No results for input(s): DDIMER in the last 72 hours. Hemoglobin A1C: No results for input(s): HGBA1C in the last 72 hours. Fasting Lipid Panel: No results for input(s): CHOL, HDL, LDLCALC, TRIG, CHOLHDL, LDLDIRECT in the last 72 hours. Thyroid Function Tests: No  results for input(s): TSH, T4TOTAL, T3FREE, THYROIDAB in the last 72 hours.  Invalid input(s): FREET3 Anemia Panel: No results for input(s): VITAMINB12, FOLATE, FERRITIN, TIBC, IRON, RETICCTPCT in the last 72 hours.  08/18/21 Venous Img Lower Unilateral Left  Result Date: 08/15/2021 CLINICAL DATA:  65 year old male with leg swelling EXAM: LEFT LOWER EXTREMITY VENOUS DOPPLER ULTRASOUND TECHNIQUE: Gray-scale sonography with graded compression, as well as color Doppler and duplex ultrasound were performed to evaluate the lower extremity deep venous systems from the level of the common femoral vein and including the common femoral, femoral, profunda femoral, popliteal and calf veins including the posterior tibial, peroneal and gastrocnemius veins when visible. The superficial great saphenous vein was also interrogated. Spectral Doppler was utilized to evaluate flow at rest and with distal augmentation maneuvers in the common femoral, femoral and popliteal veins. COMPARISON:  None. FINDINGS: Contralateral Common Femoral Vein: Respiratory phasicity is normal and symmetric with the symptomatic side. No evidence of thrombus. Normal compressibility. Common Femoral Vein: No evidence of thrombus. Normal compressibility, respiratory phasicity and response to augmentation. Saphenofemoral Junction: No evidence of thrombus. Normal compressibility and flow on color Doppler imaging. Profunda Femoral Vein: No evidence of thrombus. Normal compressibility and flow on color Doppler imaging. Femoral Vein: No evidence of thrombus. Normal compressibility, respiratory phasicity and response  to augmentation. Popliteal Vein: No evidence of thrombus. Normal compressibility, respiratory phasicity and response to augmentation. Calf Veins: No evidence of thrombus. Normal compressibility and flow on color Doppler imaging. Superficial Great Saphenous Vein: No evidence of thrombus. Normal compressibility and flow on color Doppler imaging. Other  Findings:  Edema IMPRESSION: Sonographic survey of the left lower extremity negative for DVT Electronically Signed   By: Gilmer Mor D.O.   On: 08/15/2021 15:53   DG Chest Port 1 View  Result Date: 08/15/2021 CLINICAL DATA:  chest pain, palpitations, and bradycardia. Patient presents from nursing home with swelling and redness of left leg which was treated as an outpatient with amoxicillin EXAM: PORTABLE CHEST 1 VIEW COMPARISON:  Chest x-ray 07/22/2021 FINDINGS: The heart and mediastinal contours are unchanged. No focal consolidation. No pulmonary edema. No pleural effusion. No pneumothorax. No acute osseous abnormality. IMPRESSION: No active disease. Electronically Signed   By: Tish Frederickson M.D.   On: 08/15/2021 19:00     Echo LVEF 60 to 65% x 2 D echocardiogram 07/23/2021  TELEMETRY: Atrial fibrillation at 80 bpm:  ASSESSMENT AND PLAN:  Principal Problem:   Cellulitis Active Problems:   Schizophrenia (HCC)   Diabetes mellitus, type II (HCC)   HTN (hypertension)   GERD (gastroesophageal reflux disease)   CAD (coronary artery disease)   Loss of memory   Anemia of chronic disease   Dementia (HCC)   AF (paroxysmal atrial fibrillation) (HCC)   Diabetes mellitus type 2, controlled, without complications (HCC)    1.  Chest pain, with atypical features, in patient who has prior history of CVA and vascular dementia, a poor historian, with nondiagnostic ECG, high-sensitivity troponin normal (4, 5) 2.  Coronary artery disease, status post stent 2003, nondiagnostic ECG, atypical chest pain, high-sensitivity troponin 3.  Sinus bradycardia, initial ECG revealed heart rate 43 bpm, telemetry reveals heart rate 63 bpm, currently clinically hemodynamically stable, amiodarone reduced to 100 mg daily 4.  Paroxysmal atrial fibrillation, on Eliquis for stroke prevention, and amiodarone for rate and rhythm control 5.  Left lower extremity cellulitis, on Rocephin   Recommendations   1.  Agree with  overall current therapy 2.  Would defer heparin 3.  Continue Eliquis for stroke prevention 4.  Continue amiodarone 100 mg daily 5.  Resume carvedilol 3.125 mg p.o. twice daily 6.  No indication for already at catheterization, or permanent pacemaker at this time, especially in the setting of cellulitis 7.  Defer further cardiac diagnostics at this time  Sign off for now, please call if any questions   Marcina Millard, MD, PhD, [facility] 08/16/2021 8:54 AM

## 2021-08-16 NOTE — NC FL2 (Signed)
[place] MEDICAID FL2 LEVEL OF CARE SCREENING TOOL     IDENTIFICATION  Patient Name: Brian Dodson Birthdate: [birth year] Sex: male Admission Date (Current Location): 08/15/2021  [place] and [place] Number:  [address] [id number] [facility] Facility and Address:  [facility], [address], [place], [place] [address]      Provider Number: [id number]  Attending Physician Name and Address:  Lurene Shadow, MD  Relative Name and Phone Number:  Lenn Sink (Legal Guardian)   [phone] (Work Phone)    Current Level of Care: Hospital Recommended Level of Care: Skilled Nursing Facility Prior Approval Number:    Date Approved/Denied:   PASRR Number: [id number]  Discharge Plan: SNF    Current Diagnoses: Patient Active Problem List   Diagnosis Date Noted   Cellulitis 08/15/2021   Sepsis secondary to UTI (HCC) 03/05/2021   Acute metabolic encephalopathy 03/05/2021   Dementia (HCC) 03/05/2021   Memory loss due to medical condition 03/05/2021   AF (paroxysmal atrial fibrillation) (HCC) 03/05/2021   Atrial flutter (HCC) 03/05/2021   Diabetes mellitus type 2, controlled, without complications (HCC) 03/05/2021   Obese 03/05/2021   Anemia of chronic disease 03/04/2021   Acute encephalopathy 03/02/2021   Acute lower UTI 03/02/2021   Unspecified atrial fibrillation (HCC) 03/02/2021   Acute febrile illness    Bilateral lower extremity edema 04/01/2018   Lower extremity pain, bilateral 04/01/2018   Swelling of limb 10/04/2017   Sepsis (HCC) 05/20/2017   Anemia 05/07/2017   B12 deficiency 05/07/2017   Loss of memory 05/07/2017   Atrial fibrillation with RVR (HCC) 05/23/2015   Syncope 05/23/2015   Schizophrenia (HCC) 05/23/2015   Diabetes mellitus, type II (HCC) 05/23/2015   HTN (hypertension) 05/23/2015   GERD (gastroesophageal reflux disease) 05/23/2015   CAD (coronary artery disease) 05/23/2015    Orientation RESPIRATION BLADDER Height & Weight      Self, Time, Situation, Place  Normal Continent Weight:   Height:     BEHAVIORAL SYMPTOMS/MOOD NEUROLOGICAL BOWEL NUTRITION STATUS      Continent Diet  AMBULATORY STATUS COMMUNICATION OF NEEDS Skin   Limited Assist Verbally Normal                       Personal Care Assistance Level of Assistance  Bathing, Feeding, Dressing, Total care Bathing Assistance: Limited assistance Feeding assistance: Limited assistance Dressing Assistance: Limited assistance Total Care Assistance: Limited assistance   Functional Limitations Info  Sight, Hearing, Speech   Hearing Info: Adequate Speech Info: Adequate    SPECIAL CARE FACTORS FREQUENCY  PT (By licensed PT), OT (By licensed OT)     PT Frequency: 5X per week OT Frequency: 5X per week            Contractures Contractures Info: Not present    Additional Factors Info                  Current Medications (08/16/2021):  This is the current hospital active medication list Current Facility-Administered Medications  Medication Dose Route Frequency Provider Last Rate Last Admin   amiodarone (PACERONE) tablet 100 mg  100 mg Oral Daily Paraschos, Alexander, MD   100 mg at 08/16/21 6283   apixaban (ELIQUIS) tablet 5 mg  5 mg Oral BID Cox, Amy N, DO   5 mg at 08/16/21 1517   aspirin EC tablet 81 mg  81 mg Oral Daily Cox, Amy N, DO   81 mg at 08/16/21 0916   atorvastatin (LIPITOR) tablet 10 mg  10 mg Oral QHS Cox, Amy N, DO   10 mg at 08/15/21 2134   benztropine (COGENTIN) tablet 1 mg  1 mg Oral QHS Cox, Amy N, DO   1 mg at 08/15/21 2135   brimonidine (ALPHAGAN) 0.2 % ophthalmic solution 1 drop  1 drop Both Eyes Q12H Cox, Amy N, DO   1 drop at 08/16/21 0917   carvedilol (COREG) tablet 3.125 mg  3.125 mg Oral BID WC Paraschos, Alexander, MD       cefTRIAXone (ROCEPHIN) 2 g in sodium chloride 0.9 % 100 mL IVPB  2 g Intravenous Q24H Cox, Amy N, DO   Stopped at 08/16/21 0950   citalopram (CELEXA) tablet 20 mg  20 mg Oral Daily Cox, Amy  N, DO   20 mg at 08/16/21 9563   divalproex (DEPAKOTE ER) 24 hr tablet 2,000 mg  2,000 mg Oral QHS Cox, Amy N, DO   2,000 mg at 08/15/21 2234   donepezil (ARICEPT) tablet 10 mg  10 mg Oral QHS Cox, Amy N, DO   10 mg at 08/15/21 2134   hydrALAZINE (APRESOLINE) injection 5 mg  5 mg Intravenous Q6H PRN Cox, Amy N, DO       losartan (COZAAR) tablet 25 mg  25 mg Oral Daily Cox, Amy N, DO   25 mg at 08/16/21 0916   melatonin tablet 5 mg  5 mg Oral QHS Cox, Amy N, DO   5 mg at 08/15/21 2135   metFORMIN (GLUCOPHAGE) tablet 500 mg  500 mg Oral BID WC Cox, Amy N, DO   500 mg at 08/16/21 0806   risperiDONE (RISPERDAL) tablet 4 mg  4 mg Oral QHS Cox, Amy N, DO   4 mg at 08/15/21 2233   timolol (TIMOPTIC) 0.5 % ophthalmic solution 1 drop  1 drop Both Eyes BID Cox, Amy N, DO   1 drop at 08/16/21 0917   Current Outpatient Medications  Medication Sig Dispense Refill   amiodarone (PACERONE) 200 MG tablet Take 1 tablet (200 mg total) by mouth daily.     aspirin 81 MG tablet Take 81 mg by mouth daily.      atorvastatin (LIPITOR) 10 MG tablet Take 10 mg by mouth daily.     benztropine (COGENTIN) 1 MG tablet Take 1 mg by mouth at bedtime.      brimonidine-timolol (COMBIGAN) 0.2-0.5 % ophthalmic solution Place 1 drop into both eyes every 12 (twelve) hours. Wait 3 to 5 minutes between drops     carvedilol (COREG) 6.25 MG tablet Take 6.25 mg by mouth 2 (two) times daily with a meal.     Cholecalciferol (EQL VITAMIN D3) 50 MCG (2000 UT) CAPS Take 1 capsule by mouth daily.     citalopram (CELEXA) 20 MG tablet Take 20 mg by mouth daily.     clindamycin (CLEOCIN) 300 MG capsule Take 300 mg by mouth 3 (three) times daily.     diclofenac Sodium (VOLTAREN) 1 % GEL Apply 2 g topically 4 (four) times daily. Apply to neck and shoulders     divalproex (DEPAKOTE ER) 500 MG 24 hr tablet Take 2,000 mg by mouth at bedtime.      donepezil (ARICEPT) 10 MG tablet Take 10 mg by mouth at bedtime.      ELIQUIS 5 MG TABS tablet Take 5 mg  by mouth 2 (two) times daily.     ferrous sulfate 325 (65 FE) MG tablet Take 325 mg by mouth daily with breakfast.  lanolin/mineral oil (KERI/THERA-DERM) LOTN Apply 1 application topically as needed for dry skin. Apply to face after washing face daily     losartan (COZAAR) 25 MG tablet Take 1 tablet (25 mg total) by mouth daily. May increase to 2 tablets by mouth daily if blood pressure goes above 140/90 for more than a day  or if the blood pressure goes above 150/100 at all. (Patient taking differently: Take 25 mg by mouth daily.) 30 tablet 11   LUMIGAN 0.01 % SOLN Place 1 drop into both eyes at bedtime.     melatonin 5 MG TABS Take 5 mg by mouth at bedtime.     metFORMIN (GLUCOPHAGE) 500 MG tablet Take 500 mg by mouth 2 (two) times daily with a meal.     omeprazole (PRILOSEC) 20 MG capsule Take 20 mg by mouth 2 (two) times daily before a meal. May open and sprinkle in applesauce.     Oyster Shell (OYSTER CALCIUM) 500 MG TABS tablet Take 500 mg of elemental calcium by mouth 2 (two) times daily.     risperidone (RISPERDAL) 4 MG tablet Take 4 mg by mouth at bedtime.     vitamin C (ASCORBIC ACID) 250 MG tablet Take 1 tablet (250 mg total) by mouth daily. 30 tablet 0   acetaminophen (TYLENOL) 500 MG tablet Take 1,000 mg by mouth every 6 (six) hours as needed for mild pain. (Patient not taking: Reported on 07/22/2021)     amLODipine (NORVASC) 5 MG tablet Take 5 mg by mouth daily. (Patient not taking: Reported on 07/22/2021)     divalproex (DEPAKOTE) 500 MG DR tablet Take 1,000 mg by mouth 2 (two) times daily. (Patient not taking: Reported on 08/15/2021)     furosemide (LASIX) 20 MG tablet Take 20 mg by mouth daily as needed. For edema (swelling)     latanoprost (XALATAN) 0.005 % ophthalmic solution Place 1 drop into both eyes at bedtime. (Patient not taking: Reported on 07/22/2021)     Menthol, Topical Analgesic, (BIOFREEZE) 4 % GEL Apply 1 application topically 3 (three) times daily. To lower back area as  needed for pain       Discharge Medications: Please see discharge summary for a list of discharge medications.  Relevant Imaging Results:  Relevant Lab Results:   Additional Information SS# [id number]  Joseph Art, LCSWA

## 2021-08-16 NOTE — ED Notes (Signed)
Pt resting quietly.  Pt alert .  Pt waiting on admission bed.

## 2021-08-16 NOTE — Progress Notes (Signed)
Progress Note    Brian Dodson  NWG:[id number] DOB: 02/23/1956  DOA: 08/15/2021 PCP: Galvin Proffer, MD      Brief Narrative:    Medical records reviewed and are as summarized below:  Brian Dodson is a 65 y.o. male with medical history significant for atrial fibrillation, hypertension, non-insulin-dependent diabetes mellitus, early dementia/cognitive impairment, schizophrenia, who presented from an assisted living facility to the emergency room because of left leg wound.  Prior to admission, he had been on oral antibiotics (clindamycin) for left leg cellulitis but he was not getting any better.  He was admitted to the hospital for left leg cellulitis after failed outpatient antibiotics.     Assessment/Plan:   Principal Problem:   Cellulitis Active Problems:   Schizophrenia (HCC)   Diabetes mellitus, type II (HCC)   HTN (hypertension)   GERD (gastroesophageal reflux disease)   CAD (coronary artery disease)   Loss of memory   Anemia of chronic disease   Dementia (HCC)   AF (paroxysmal atrial fibrillation) (HCC)   Diabetes mellitus type 2, controlled, without complications (HCC)    Left leg cellulitis, left leg wound: Failed outpatient clindamycin.  Continue IV Rocephin.  Blood cultures pending.  No evidence of DVT in left lower extremity  Atypical chest pain, asymptomatic sinus bradycardia, CAD s/p coronary stent (? 2005): Patient evaluated by the cardiologist.  No plans for invasive work-up.  Paroxysmal atrial fibrillation: Continue Eliquis, amiodarone and carvedilol  Dementia, schizophrenia: Continue psychotropics  Other comorbidities include depression, anxiety, hypertension,  Diet Order             Diet Heart Room service appropriate? Yes; Fluid consistency: Thin  Diet effective now                      Consultants: Cardiologist  Procedures: None    Medications:    amiodarone  100 mg Oral Daily   apixaban  5 mg Oral BID   aspirin EC   81 mg Oral Daily   atorvastatin  10 mg Oral QHS   benztropine  1 mg Oral QHS   brimonidine  1 drop Both Eyes Q12H   carvedilol  3.125 mg Oral BID WC   citalopram  20 mg Oral Daily   divalproex  2,000 mg Oral QHS   donepezil  10 mg Oral QHS   losartan  25 mg Oral Daily   melatonin  5 mg Oral QHS   metFORMIN  500 mg Oral BID WC   risperidone  4 mg Oral QHS   timolol  1 drop Both Eyes BID   Continuous Infusions:  cefTRIAXone (ROCEPHIN)  IV 2 g (08/16/21 0917)     Anti-infectives (From admission, onward)    Start     Dose/Rate Route Frequency Ordered Stop   08/16/21 1000  cefTRIAXone (ROCEPHIN) 2 g in sodium chloride 0.9 % 100 mL IVPB        2 g 200 mL/hr over 30 Minutes Intravenous Every 24 hours 08/15/21 1535 08/22/21 0959   08/15/21 1415  cefTRIAXone (ROCEPHIN) 2 g in sodium chloride 0.9 % 100 mL IVPB        2 g 200 mL/hr over 30 Minutes Intravenous  Once 08/15/21 1400 08/15/21 1507              Family Communication/Anticipated D/C date and plan/Code Status   DVT prophylaxis: Place TED hose Start: 08/15/21 1535 apixaban (ELIQUIS) tablet 5 mg  Code Status: Full Code  Family Communication: None Disposition Plan:    Status is: Inpatient  Remains inpatient appropriate because:IV treatments appropriate due to intensity of illness or inability to take PO and Inpatient level of care appropriate due to severity of illness  Dispo: The patient is from: ALF              Anticipated d/c is to: ALF              Patient currently is not medically stable to d/c.   Difficult to place patient No                Subjective:   Interval events noted.  No pain in the lower extremities but he reports swelling in the left leg  Objective:    Vitals:   08/16/21 0430 08/16/21 0530 08/16/21 0600 08/16/21 0805  BP: 102/83 133/84 98/77 104/76  Pulse: 90 72 64 80  Resp: 16 16 14 18   Temp:    97.8 F (36.6 C)  TempSrc:    Oral  SpO2: 97% 96% 99% 100%   No  data found.   Intake/Output Summary (Last 24 hours) at 08/16/2021 1057 Last data filed at 08/16/2021 0811 Gross per 24 hour  Intake --  Output 975 ml  Net -975 ml   There were no vitals filed for this visit.  Exam:  GEN: NAD SKIN: Superficial wound on the left knee EYES: EOMI ENT: MMM CV: RRR PULM: CTA B ABD: soft, ND, NT, +BS CNS: AAO x 2 (person and place) , non focal EXT: Left leg edema without erythema or tenderness       Data Reviewed:   I have personally reviewed following labs and imaging studies:  Labs: Labs show the following:   Basic Metabolic Panel: Recent Labs  Lab 08/15/21 1139 08/16/21 0517  NA 141 140  K 4.2 4.4  CL 103 103  CO2 29 29  GLUCOSE 76 97  BUN 33* 27*  CREATININE 1.02 1.03  CALCIUM 9.2 9.0   GFR CrCl cannot be calculated (Unknown ideal weight.). Liver Function Tests: Recent Labs  Lab 08/15/21 1139  AST 18  ALT 11  ALKPHOS 55  BILITOT 0.5  PROT 7.1  ALBUMIN 3.4*   No results for input(s): LIPASE, AMYLASE in the last 168 hours. No results for input(s): AMMONIA in the last 168 hours. Coagulation profile Recent Labs  Lab 08/15/21 1139  INR 1.0    CBC: Recent Labs  Lab 08/15/21 1139 08/16/21 0517  WBC 5.5 6.7  NEUTROABS 2.3  --   HGB 10.1* 11.1*  HCT 30.2* 34.0*  MCV 103.1* 103.3*  PLT 181 188   Cardiac Enzymes: No results for input(s): CKTOTAL, CKMB, CKMBINDEX, TROPONINI in the last 168 hours. BNP (last 3 results) No results for input(s): PROBNP in the last 8760 hours. CBG: No results for input(s): GLUCAP in the last 168 hours. D-Dimer: No results for input(s): DDIMER in the last 72 hours. Hgb A1c: No results for input(s): HGBA1C in the last 72 hours. Lipid Profile: No results for input(s): CHOL, HDL, LDLCALC, TRIG, CHOLHDL, LDLDIRECT in the last 72 hours. Thyroid function studies: No results for input(s): TSH, T4TOTAL, T3FREE, THYROIDAB in the last 72 hours.  Invalid input(s): FREET3 Anemia work  up: No results for input(s): VITAMINB12, FOLATE, FERRITIN, TIBC, IRON, RETICCTPCT in the last 72 hours. Sepsis Labs: Recent Labs  Lab 08/15/21 1139 08/15/21 1922 08/16/21 0517  PROCALCITON  --  <0.10  --  WBC 5.5  --  6.7  LATICACIDVEN 1.6 1.5  --     Microbiology Recent Results (from the past 240 hour(s))  Culture, blood (routine x 2)     Status: None (Preliminary result)   Collection Time: 08/15/21 11:39 AM   Specimen: BLOOD  Result Value Ref Range Status   Specimen Description BLOOD LEFT AC  Final   Special Requests   Final    BOTTLES DRAWN AEROBIC AND ANAEROBIC Blood Culture results may not be optimal due to an inadequate volume of blood received in culture bottles   Culture   Final    NO GROWTH < 24 HOURS Performed at [facility], [address] [place], [place] [address]    Report Status PENDING  Incomplete  Culture, blood (routine x 2)     Status: None (Preliminary result)   Collection Time: 08/15/21 11:49 AM   Specimen: BLOOD  Result Value Ref Range Status   Specimen Description BLOOD  RIGHT WRIST  Final   Special Requests   Final    BOTTLES DRAWN AEROBIC ONLY Blood Culture results may not be optimal due to an inadequate volume of blood received in culture bottles   Culture   Final    NO GROWTH < 24 HOURS Performed at [facility], [address] [place], [place] [address]    Report Status PENDING  Incomplete    Procedures and diagnostic studies:  US Venous Img Lower Unilateral Left  Result Date: 08/15/2021 CLINICAL DATA:  65 year old male with leg swelling EXAM: LEFT LOWER EXTREMITY VENOUS DOPPLER ULTRASOUND TECHNIQUE: Gray-scale sonography with graded compression, as well as color Doppler and duplex ultrasound were performed to evaluate the lower extremity deep venous systems from the level of the common femoral vein and including the common femoral, femoral, profunda femoral, popliteal and calf veins including the posterior tibial,  peroneal and gastrocnemius veins when visible. The superficial great saphenous vein was also interrogated. Spectral Doppler was utilized to evaluate flow at rest and with distal augmentation maneuvers in the common femoral, femoral and popliteal veins. COMPARISON:  None. FINDINGS: Contralateral Common Femoral Vein: Respiratory phasicity is normal and symmetric with the symptomatic side. No evidence of thrombus. Normal compressibility. Common Femoral Vein: No evidence of thrombus. Normal compressibility, respiratory phasicity and response to augmentation. Saphenofemoral Junction: No evidence of thrombus. Normal compressibility and flow on color Doppler imaging. Profunda Femoral Vein: No evidence of thrombus. Normal compressibility and flow on color Doppler imaging. Femoral Vein: No evidence of thrombus. Normal compressibility, respiratory phasicity and response to augmentation. Popliteal Vein: No evidence of thrombus. Normal compressibility, respiratory phasicity and response to augmentation. Calf Veins: No evidence of thrombus. Normal compressibility and flow on color Doppler imaging. Superficial Great Saphenous Vein: No evidence of thrombus. Normal compressibility and flow on color Doppler imaging. Other Findings:  Edema IMPRESSION: Sonographic survey of the left lower extremity negative for DVT Electronically Signed   By: Gilmer Mor D.O.   On: 08/15/2021 15:53   DG Chest Port 1 View  Result Date: 08/15/2021 CLINICAL DATA:  chest pain, palpitations, and bradycardia. Patient presents from nursing home with swelling and redness of left leg which was treated as an outpatient with amoxicillin EXAM: PORTABLE CHEST 1 VIEW COMPARISON:  Chest x-ray 07/22/2021 FINDINGS: The heart and mediastinal contours are unchanged. No focal consolidation. No pulmonary edema. No pleural effusion. No pneumothorax. No acute osseous abnormality. IMPRESSION: No active disease. Electronically Signed   By: Tish Frederickson M.D.   On:  08/15/2021 19:00               LOS: 0 days   Neal Trulson  [facility]   Pager on [email or web address]. If 7PM-7AM, please contact night-coverage at [email or web address]     08/16/2021, 10:57 AM

## 2021-08-16 NOTE — ED Notes (Signed)
Upon hourly rounding on pt, pt found sitting on bench with family in room. Pt assisted back to bed and hooked up to monitor. Placed on bed alarm. Call bell in reach. Stretcher locked in lowest position

## 2021-08-16 NOTE — ED Notes (Signed)
Report messaged to wilacynt rn

## 2021-08-17 DIAGNOSIS — L03116 Cellulitis of left lower limb: Secondary | ICD-10-CM | POA: Diagnosis not present

## 2021-08-17 NOTE — Progress Notes (Signed)
Progress Note    Brian Dodson  OBS:[id number] DOB: August 18, 1956  DOA: 08/15/2021 PCP: Galvin Proffer, MD      Brief Narrative:    Medical records reviewed and are as summarized below:  Brian Dodson is a 65 y.o. male with medical history significant for atrial fibrillation, hypertension, non-insulin-dependent diabetes mellitus, early dementia/cognitive impairment, schizophrenia, who presented from an assisted living facility to the emergency room because of left leg wound.  Prior to admission, he had been on oral antibiotics (clindamycin) for left leg cellulitis but he was not getting any better.  He was admitted to the hospital for left leg cellulitis after failed outpatient antibiotics.     Assessment/Plan:   Principal Problem:   Cellulitis Active Problems:   Schizophrenia (HCC)   Diabetes mellitus, type II (HCC)   HTN (hypertension)   GERD (gastroesophageal reflux disease)   CAD (coronary artery disease)   Loss of memory   Anemia of chronic disease   Dementia (HCC)   AF (paroxysmal atrial fibrillation) (HCC)   Diabetes mellitus type 2, controlled, without complications (HCC)    Left leg cellulitis, left leg wound: Failed outpatient clindamycin.  Continue IV Rocephin.  No growth on blood cultures thus far.  No evidence of DVT in left lower extremity  Atypical chest pain, asymptomatic sinus bradycardia, CAD s/p coronary stent (? 2005): Patient evaluated by the cardiologist.  No plans for invasive work-up.  Paroxysmal atrial fibrillation: Continue Eliquis, amiodarone and carvedilol  Dementia, schizophrenia: Continue psychotropics  Other comorbidities include depression, anxiety, hypertension,  Possible discharge to assisted living facility tomorrow.  Diet Order             Diet Heart Room service appropriate? Yes; Fluid consistency: Thin  Diet effective now                      Consultants: Cardiologist  Procedures: None    Medications:     amiodarone  100 mg Oral Daily   apixaban  5 mg Oral BID   aspirin EC  81 mg Oral Daily   atorvastatin  10 mg Oral QHS   benztropine  1 mg Oral QHS   brimonidine  1 drop Both Eyes Q12H   carvedilol  3.125 mg Oral BID WC   citalopram  20 mg Oral Daily   divalproex  2,000 mg Oral QHS   donepezil  10 mg Oral QHS   losartan  25 mg Oral Daily   melatonin  5 mg Oral QHS   metFORMIN  500 mg Oral BID WC   risperidone  4 mg Oral QHS   timolol  1 drop Both Eyes BID   Continuous Infusions:  cefTRIAXone (ROCEPHIN)  IV 2 g (08/17/21 1114)     Anti-infectives (From admission, onward)    Start     Dose/Rate Route Frequency Ordered Stop   08/16/21 1000  cefTRIAXone (ROCEPHIN) 2 g in sodium chloride 0.9 % 100 mL IVPB        2 g 200 mL/hr over 30 Minutes Intravenous Every 24 hours 08/15/21 1535 08/22/21 0959   08/15/21 1415  cefTRIAXone (ROCEPHIN) 2 g in sodium chloride 0.9 % 100 mL IVPB        2 g 200 mL/hr over 30 Minutes Intravenous  Once 08/15/21 1400 08/15/21 1507              Family Communication/Anticipated D/C date and plan/Code Status   DVT prophylaxis: Place TED hose  Start: 08/15/21 1535 apixaban (ELIQUIS) tablet 5 mg     Code Status: Full Code  Family Communication: None Disposition Plan:    Status is: Inpatient  Remains inpatient appropriate because:IV treatments appropriate due to intensity of illness or inability to take PO and Inpatient level of care appropriate due to severity of illness  Dispo: The patient is from: ALF              Anticipated d/c is to: ALF              Patient currently is not medically stable to d/c.   Difficult to place patient No                Subjective:   Interval events noted.  No new complaints.  Still has swelling in the left leg.  No pain.  Objective:    Vitals:   08/17/21 0443 08/17/21 0700 08/17/21 0746 08/17/21 1524  BP: (!) 137/95  (!) 120/92 115/81  Pulse: 62  82 (!) 50  Resp: 18  16 16   Temp:  98 F (36.7 C)  97.6 F (36.4 C) 98.2 F (36.8 C)  TempSrc:   Oral Oral  SpO2: 99%  98% 99%  Weight:  102.2 kg    Height:  6\' 1"  (1.854 m)     No data found.   Intake/Output Summary (Last 24 hours) at 08/17/2021 1539 Last data filed at 08/17/2021 1034 Gross per 24 hour  Intake --  Output 1100 ml  Net -1100 ml   Filed Weights   08/17/21 0700  Weight: 102.2 kg    Exam:  GEN: NAD SKIN: Warm and dry.  Superficial wound on her left knee EYES: No pallor or icterus ENT: MMM CV: RRR PULM: CTA B ABD: soft, ND, NT, +BS CNS: AAO x 3, non focal EXT: Left leg edema.  No erythema or tenderness     Data Reviewed:   I have personally reviewed following labs and imaging studies:  Labs: Labs show the following:   Basic Metabolic Panel: Recent Labs  Lab 08/15/21 1139 08/16/21 0517  NA 141 140  K 4.2 4.4  CL 103 103  CO2 29 29  GLUCOSE 76 97  BUN 33* 27*  CREATININE 1.02 1.03  CALCIUM 9.2 9.0   GFR Estimated Creatinine Clearance: 89.8 mL/min (by C-G formula based on SCr of 1.03 mg/dL). Liver Function Tests: Recent Labs  Lab 08/15/21 1139  AST 18  ALT 11  ALKPHOS 55  BILITOT 0.5  PROT 7.1  ALBUMIN 3.4*   No results for input(s): LIPASE, AMYLASE in the last 168 hours. No results for input(s): AMMONIA in the last 168 hours. Coagulation profile Recent Labs  Lab 08/15/21 1139  INR 1.0    CBC: Recent Labs  Lab 08/15/21 1139 08/16/21 0517  WBC 5.5 6.7  NEUTROABS 2.3  --   HGB 10.1* 11.1*  HCT 30.2* 34.0*  MCV 103.1* 103.3*  PLT 181 188   Cardiac Enzymes: No results for input(s): CKTOTAL, CKMB, CKMBINDEX, TROPONINI in the last 168 hours. BNP (last 3 results) No results for input(s): PROBNP in the last 8760 hours. CBG: No results for input(s): GLUCAP in the last 168 hours. D-Dimer: No results for input(s): DDIMER in the last 72 hours. Hgb A1c: No results for input(s): HGBA1C in the last 72 hours. Lipid Profile: No results for input(s): CHOL,  HDL, LDLCALC, TRIG, CHOLHDL, LDLDIRECT in the last 72 hours. Thyroid function studies: No results for input(s): TSH, T4TOTAL,  T3FREE, THYROIDAB in the last 72 hours.  Invalid input(s): FREET3 Anemia work up: No results for input(s): VITAMINB12, FOLATE, FERRITIN, TIBC, IRON, RETICCTPCT in the last 72 hours. Sepsis Labs: Recent Labs  Lab 08/15/21 1139 08/15/21 1922 08/16/21 0517  PROCALCITON  --  <0.10  --   WBC 5.5  --  6.7  LATICACIDVEN 1.6 1.5  --     Microbiology Recent Results (from the past 240 hour(s))  Culture, blood (routine x 2)     Status: None (Preliminary result)   Collection Time: 08/15/21 11:39 AM   Specimen: BLOOD  Result Value Ref Range Status   Specimen Description BLOOD LEFT AC  Final   Special Requests   Final    BOTTLES DRAWN AEROBIC AND ANAEROBIC Blood Culture results may not be optimal due to an inadequate volume of blood received in culture bottles   Culture   Final    NO GROWTH 2 DAYS Performed at [facility], [address] [place], [place] [address]    Report Status PENDING  Incomplete  Culture, blood (routine x 2)     Status: None (Preliminary result)   Collection Time: 08/15/21 11:49 AM   Specimen: BLOOD  Result Value Ref Range Status   Specimen Description BLOOD  RIGHT WRIST  Final   Special Requests   Final    BOTTLES DRAWN AEROBIC ONLY Blood Culture results may not be optimal due to an inadequate volume of blood received in culture bottles   Culture   Final    NO GROWTH 2 DAYS Performed at [facility], [address], [place], [place] [address]    Report Status PENDING  Incomplete  SARS CORONAVIRUS 2 (TAT 6-24 HRS) Nasopharyngeal Nasopharyngeal Swab     Status: None   Collection Time: 08/15/21  9:40 PM   Specimen: Nasopharyngeal Swab  Result Value Ref Range Status   SARS Coronavirus 2 NEGATIVE NEGATIVE Final    Comment: (NOTE) SARS-CoV-2 target nucleic acids are NOT DETECTED.  The SARS-CoV-2 RNA is generally  detectable in upper and lower respiratory specimens during the acute phase of infection. Negative results do not preclude SARS-CoV-2 infection, do not rule out co-infections with other pathogens, and should not be used as the sole basis for treatment or other patient management decisions. Negative results must be combined with clinical observations, patient history, and epidemiological information. The expected result is Negative.  Fact Sheet for Patients: HairSlick.no  Fact Sheet for Healthcare Providers: quierodirigir.com  This test is not yet approved or cleared by the [place] FDA and  has been authorized for detection and/or diagnosis of SARS-CoV-2 by FDA under an Emergency Use Authorization (EUA). This EUA will remain  in effect (meaning this test can be used) for the duration of the COVID-19 declaration under Se ction 564(b)(1) of the Act, 21 U.S.C. section 360bbb-3(b)(1), unless the authorization is terminated or revoked sooner.  Performed at [facility], [address] [place], [place] [address]     Procedures and diagnostic studies:  US Venous Img Lower Unilateral Left  Result Date: 08/15/2021 CLINICAL DATA:  65 year old male with leg swelling EXAM: LEFT LOWER EXTREMITY VENOUS DOPPLER ULTRASOUND TECHNIQUE: Gray-scale sonography with graded compression, as well as color Doppler and duplex ultrasound were performed to evaluate the lower extremity deep venous systems from the level of the common femoral vein and including the common femoral, femoral, profunda femoral, popliteal and calf veins including the posterior tibial, peroneal and gastrocnemius veins when visible. The superficial  great saphenous vein was also interrogated. Spectral Doppler was utilized to evaluate flow at rest and with distal augmentation maneuvers in the common femoral, femoral and popliteal veins. COMPARISON:  None. FINDINGS:  Contralateral Common Femoral Vein: Respiratory phasicity is normal and symmetric with the symptomatic side. No evidence of thrombus. Normal compressibility. Common Femoral Vein: No evidence of thrombus. Normal compressibility, respiratory phasicity and response to augmentation. Saphenofemoral Junction: No evidence of thrombus. Normal compressibility and flow on color Doppler imaging. Profunda Femoral Vein: No evidence of thrombus. Normal compressibility and flow on color Doppler imaging. Femoral Vein: No evidence of thrombus. Normal compressibility, respiratory phasicity and response to augmentation. Popliteal Vein: No evidence of thrombus. Normal compressibility, respiratory phasicity and response to augmentation. Calf Veins: No evidence of thrombus. Normal compressibility and flow on color Doppler imaging. Superficial Great Saphenous Vein: No evidence of thrombus. Normal compressibility and flow on color Doppler imaging. Other Findings:  Edema IMPRESSION: Sonographic survey of the left lower extremity negative for DVT Electronically Signed   By: Gilmer Mor D.O.   On: 08/15/2021 15:53   DG Chest Port 1 View  Result Date: 08/15/2021 CLINICAL DATA:  chest pain, palpitations, and bradycardia. Patient presents from nursing home with swelling and redness of left leg which was treated as an outpatient with amoxicillin EXAM: PORTABLE CHEST 1 VIEW COMPARISON:  Chest x-ray 07/22/2021 FINDINGS: The heart and mediastinal contours are unchanged. No focal consolidation. No pulmonary edema. No pleural effusion. No pneumothorax. No acute osseous abnormality. IMPRESSION: No active disease. Electronically Signed   By: Tish Frederickson M.D.   On: 08/15/2021 19:00               LOS: 1 day   Dorean Hiebert  [facility]   Pager on [email or web address]. If 7PM-7AM, please contact night-coverage at [email or web address]     08/17/2021, 3:39 PM

## 2021-08-17 NOTE — Evaluation (Signed)
Physical Therapy Evaluation Patient Details Name: Brian Dodson MRN: [id number] DOB: Nov 27, 1955 Today's Date: 08/17/2021  History of Present Illness  Brian Dodson is a 65 y.o. male with medical history significant for atrial fibrillation, hypertension, non-insulin-dependent diabetes mellitus, early dementia/memory deficits, history of schizophrenia, is at a long-term nursing home, presents emergency department for chief concerns of left lower extremity wound.   Clinical Impression  Patient alert, pleasant, bed alarm going off when entered room. Patient is agreeable to PT assessment. He is independent with bed mobility, transfers and ambulation of 175 feet without AD. Patient is at baseline level of mobility. No further PT needs at this time.       Recommendations for follow up therapy are one component of a multi-disciplinary discharge planning process, led by the attending physician.  Recommendations may be updated based on patient status, additional functional criteria and insurance authorization.  Follow Up Recommendations No PT follow up    Equipment Recommendations  None recommended by PT    Recommendations for Other Services       Precautions / Restrictions Precautions Precautions: None Restrictions Weight Bearing Restrictions: No      Mobility  Bed Mobility Overal bed mobility: Independent                  Transfers Overall transfer level: Independent                  Ambulation/Gait Ambulation/Gait assistance: Independent Gait Distance (Feet): 175 Feet Assistive device: None Gait Pattern/deviations: Step-through pattern Gait velocity: WNL   General Gait Details: no assist other than directional cues. No LOB. Patient appears to be at baseline. No pain reported in L knee with ambulation  Stairs            Wheelchair Mobility    Modified Rankin (Stroke Patients Only)       Balance Overall balance assessment: No apparent balance  deficits (not formally assessed)                                           Pertinent Vitals/Pain Pain Assessment: No/denies pain    Home Living Family/patient expects to be discharged to:: Assisted living               Home Equipment: None      Prior Function Level of Independence: Independent               Hand Dominance   Dominant Hand: Right    Extremity/Trunk Assessment   Upper Extremity Assessment Upper Extremity Assessment: Overall WFL for tasks assessed    Lower Extremity Assessment Lower Extremity Assessment: Overall WFL for tasks assessed    Cervical / Trunk Assessment Cervical / Trunk Assessment: Normal  Communication   Communication: No difficulties  Cognition Arousal/Alertness: Awake/alert Behavior During Therapy: WFL for tasks assessed/performed Overall Cognitive Status: History of cognitive impairments - at baseline                                        General Comments      Exercises     Assessment/Plan    PT Assessment Patent does not need any further PT services  PT Problem List         PT Treatment Interventions  PT Goals (Current goals can be found in the Care Plan section)  Acute Rehab PT Goals Patient Stated Goal: none stated PT Goal Formulation: Patient unable to participate in goal setting Time For Goal Achievement: 08/19/21 Potential to Achieve Goals: Good    Frequency     Barriers to discharge        Co-evaluation               AM-PAC PT "6 Clicks" Mobility  Outcome Measure Help needed turning from your back to your side while in a flat bed without using bedrails?: None Help needed moving from lying on your back to sitting on the side of a flat bed without using bedrails?: None Help needed moving to and from a bed to a chair (including a wheelchair)?: None Help needed standing up from a chair using your arms (e.g., wheelchair or bedside chair)?: None Help  needed to walk in hospital room?: None Help needed climbing 3-5 steps with a railing? : None 6 Click Score: 24    End of Session Equipment Utilized During Treatment: Gait belt Activity Tolerance: Patient tolerated treatment well Patient left: in bed;with call bell/phone within reach;with bed alarm set Nurse Communication: Mobility status      Time: 5638-9373 PT Time Calculation (min) (ACUTE ONLY): 11 min   Charges:   PT Evaluation $PT Eval Low Complexity: 1 Low          Lorayne Getchell, PT, GCS 08/17/21,9:13 AM

## 2021-08-17 NOTE — TOC Progression Note (Signed)
Transition of Care [facility] - Progression Note    Patient Details  Name: Brian Dodson MRN: [id number] Date of Birth: [birth year]  Transition of Care [facility]) CM/SW Contact  Barrie Dunker, RN Phone Number: 08/17/2021, 12:56 PM  Clinical Narrative:    The patient resides at [facility], I spoke with his Niece Tish and Left a secure VM for the legal guardian Lenn Sink, the plan will be to return to [facility], An updated FL2 with DC meds will need to be faxed to [phone] at [place], Tammy at [facility] would like to be alerted when plan to DC back, will need to verify transportation with her [phone]   Expected Discharge Plan: Skilled Nursing Facility Barriers to Discharge: Continued Medical Work up, SNF Pending bed offer  Expected Discharge Plan and Services Expected Discharge Plan: Skilled Nursing Facility In-house Referral: Clinical Social Work   Post Acute Care Choice: Skilled Nursing Facility Living arrangements for the past 2 months: Assisted Living Facility ([facility])                                       Social Determinants of Health (SDOH) Interventions    Readmission Risk Interventions No flowsheet data found.

## 2021-08-18 DIAGNOSIS — L03116 Cellulitis of left lower limb: Secondary | ICD-10-CM | POA: Diagnosis not present

## 2021-08-18 MED ORDER — CEFADROXIL 500 MG PO CAPS: 500.0000 mg | ORAL_CAPSULE | Freq: Two times a day (BID) | ORAL | 0 refills | Status: AC

## 2021-08-18 MED ORDER — DICLOFENAC SODIUM 1 % EX GEL: 2.0000 g | Freq: Four times a day (QID) | CUTANEOUS | Status: DC | PRN

## 2021-08-18 NOTE — TOC Progression Note (Signed)
Transition of Care [facility]) - Progression Note    Patient Details  Name: Brian Dodson MRN: [id number] Date of Birth: 1956/01/13  Transition of Care [facility]) CM/SW Contact  Hetty Ely, RN Phone Number: 08/18/2021, 2:20 PM  Clinical Narrative:  To discharge back to [facility], [facility] and Discharge summary faxed to [phone] as requested, attention Tammy. [facility] call to transport to facility per Tammy request.    Expected Discharge Plan: Skilled Nursing Facility Barriers to Discharge: Barriers Resolved  Expected Discharge Plan and Services Expected Discharge Plan: Skilled Nursing Facility In-house Referral: Clinical Social Work   Post Acute Care Choice: Skilled Nursing Facility Living arrangements for the past 2 months: Assisted Living Facility ([facility]) Expected Discharge Date: 08/18/21                                     Social Determinants of Health (SDOH) Interventions    Readmission Risk Interventions No flowsheet data found.

## 2021-08-18 NOTE — Discharge Summary (Signed)
Physician Discharge Summary  Brian Dodson RKY:[id number] DOB: [birth year] DOA: 08/15/2021  PCP: Galvin Proffer, MD  Admit date: 08/15/2021 Discharge date: 08/18/2021  Discharge disposition: Assisted living facility   Recommendations for Outpatient Follow-Up:   Follow-up with PCP in 1 week    Discharge Diagnosis:   Principal Problem:   Cellulitis Active Problems:   Schizophrenia (HCC)   Diabetes mellitus, type II (HCC)   HTN (hypertension)   GERD (gastroesophageal reflux disease)   CAD (coronary artery disease)   Loss of memory   Anemia of chronic disease   Dementia (HCC)   AF (paroxysmal atrial fibrillation) (HCC)   Diabetes mellitus type 2, controlled, without complications (HCC)    Discharge Condition: Stable.  Diet recommendation:  Diet Order             Diet - low sodium heart healthy           Diet Carb Modified           Diet Heart Room service appropriate? Yes; Fluid consistency: Thin  Diet effective now                     Code Status: Full Code     Hospital Course:   Brian Dodson is a 65 y.o. male with medical history significant for atrial fibrillation, hypertension, non-insulin-dependent diabetes mellitus, early dementia/cognitive impairment, schizophrenia, who presented from an assisted living facility to the emergency room because of left leg wound.  Prior to admission, he had been on oral antibiotics (clindamycin) for left leg cellulitis but he was not getting any better.  He was admitted to the hospital for left leg cellulitis after failed outpatient antibiotics.  He also has a wound on the left leg near the lower part of the left knee.  He was treated with empiric IV Rocephin.  He also complained of chest pain and palpitations.  He was noted to be bradycardic with heart rate in the 40s.  Cardiologist was consulted for this.  Conservative management was recommended.   His condition has improved and is deemed stable for discharge to the  assisted living facility today.    Medical Consultants:   Cardiologist: Dr. Darrold Junker   Discharge Exam:    Vitals:   08/17/21 1524 08/17/21 1941 08/18/21 0624 08/18/21 0754  BP: 115/81 102/69 107/73 106/80  Pulse: (!) 50 61 (!) 50 (!) 55  Resp: 16 19 18 16   Temp: 98.2 F (36.8 C) 98.8 F (37.1 C) 97.9 F (36.6 C) 98.1 F (36.7 C)  TempSrc: Oral  Oral   SpO2: 99% 99% 98% 99%  Weight:      Height:         GEN: NAD SKIN: Warm and dry.  Wound on the left leg just below the left knee. EYES: No pallor or icterus ENT: MMM CV: RRR PULM: CTA B ABD: soft, ND, NT, +BS CNS: AAO x 2 (person and place), speech is slow, non focal EXT: No edema or tenderness PSYCH: Calm and cooperative but he has poor insight.   The results of significant diagnostics from this hospitalization (including imaging, microbiology, ancillary and laboratory) are listed below for reference.     Procedures and Diagnostic Studies:   Venous Img Lower Unilateral Left  Result Date: 08/15/2021 CLINICAL DATA:  64 year old male with leg swelling EXAM: LEFT LOWER EXTREMITY VENOUS DOPPLER ULTRASOUND TECHNIQUE: Gray-scale sonography with graded compression, as well as color Doppler and duplex ultrasound were performed to evaluate  the lower extremity deep venous systems from the level of the common femoral vein and including the common femoral, femoral, profunda femoral, popliteal and calf veins including the posterior tibial, peroneal and gastrocnemius veins when visible. The superficial great saphenous vein was also interrogated. Spectral Doppler was utilized to evaluate flow at rest and with distal augmentation maneuvers in the common femoral, femoral and popliteal veins. COMPARISON:  None. FINDINGS: Contralateral Common Femoral Vein: Respiratory phasicity is normal and symmetric with the symptomatic side. No evidence of thrombus. Normal compressibility. Common Femoral Vein: No evidence of thrombus. Normal  compressibility, respiratory phasicity and response to augmentation. Saphenofemoral Junction: No evidence of thrombus. Normal compressibility and flow on color Doppler imaging. Profunda Femoral Vein: No evidence of thrombus. Normal compressibility and flow on color Doppler imaging. Femoral Vein: No evidence of thrombus. Normal compressibility, respiratory phasicity and response to augmentation. Popliteal Vein: No evidence of thrombus. Normal compressibility, respiratory phasicity and response to augmentation. Calf Veins: No evidence of thrombus. Normal compressibility and flow on color Doppler imaging. Superficial Great Saphenous Vein: No evidence of thrombus. Normal compressibility and flow on color Doppler imaging. Other Findings:  Edema IMPRESSION: Sonographic survey of the left lower extremity negative for DVT Electronically Signed   By: Gilmer Mor D.O.   On: 08/15/2021 15:53   DG Chest Port 1 View  Result Date: 08/15/2021 CLINICAL DATA:  chest pain, palpitations, and bradycardia. Patient presents from nursing home with swelling and redness of left leg which was treated as an outpatient with amoxicillin EXAM: PORTABLE CHEST 1 VIEW COMPARISON:  Chest x-ray 07/22/2021 FINDINGS: The heart and mediastinal contours are unchanged. No focal consolidation. No pulmonary edema. No pleural effusion. No pneumothorax. No acute osseous abnormality. IMPRESSION: No active disease. Electronically Signed   By: Tish Frederickson M.D.   On: 08/15/2021 19:00     Labs:   Basic Metabolic Panel: Recent Labs  Lab 08/15/21 1139 08/16/21 0517  NA 141 140  K 4.2 4.4  CL 103 103  CO2 29 29  GLUCOSE 76 97  BUN 33* 27*  CREATININE 1.02 1.03  CALCIUM 9.2 9.0   GFR Estimated Creatinine Clearance: 89.8 mL/min (by C-G formula based on SCr of 1.03 mg/dL). Liver Function Tests: Recent Labs  Lab 08/15/21 1139  AST 18  ALT 11  ALKPHOS 55  BILITOT 0.5  PROT 7.1  ALBUMIN 3.4*   No results for input(s): LIPASE,  AMYLASE in the last 168 hours. No results for input(s): AMMONIA in the last 168 hours. Coagulation profile Recent Labs  Lab 08/15/21 1139  INR 1.0    CBC: Recent Labs  Lab 08/15/21 1139 08/16/21 0517  WBC 5.5 6.7  NEUTROABS 2.3  --   HGB 10.1* 11.1*  HCT 30.2* 34.0*  MCV 103.1* 103.3*  PLT 181 188   Cardiac Enzymes: No results for input(s): CKTOTAL, CKMB, CKMBINDEX, TROPONINI in the last 168 hours. BNP: Invalid input(s): POCBNP CBG: No results for input(s): GLUCAP in the last 168 hours. D-Dimer No results for input(s): DDIMER in the last 72 hours. Hgb A1c No results for input(s): HGBA1C in the last 72 hours. Lipid Profile No results for input(s): CHOL, HDL, LDLCALC, TRIG, CHOLHDL, LDLDIRECT in the last 72 hours. Thyroid function studies No results for input(s): TSH, T4TOTAL, T3FREE, THYROIDAB in the last 72 hours.  Invalid input(s): FREET3 Anemia work up No results for input(s): VITAMINB12, FOLATE, FERRITIN, TIBC, IRON, RETICCTPCT in the last 72 hours. Microbiology Recent Results (from the past 240 hour(s))  Culture, blood (  routine x 2)     Status: None (Preliminary result)   Collection Time: 08/15/21 11:39 AM   Specimen: BLOOD  Result Value Ref Range Status   Specimen Description BLOOD LEFT AC  Final   Special Requests   Final    BOTTLES DRAWN AEROBIC AND ANAEROBIC Blood Culture results may not be optimal due to an inadequate volume of blood received in culture bottles   Culture   Final    NO GROWTH 3 DAYS Performed at [facility], [address], [place], [place] [address]    Report Status PENDING  Incomplete  Culture, blood (routine x 2)     Status: None (Preliminary result)   Collection Time: 08/15/21 11:49 AM   Specimen: BLOOD  Result Value Ref Range Status   Specimen Description BLOOD  RIGHT WRIST  Final   Special Requests   Final    BOTTLES DRAWN AEROBIC ONLY Blood Culture results may not be optimal due to an inadequate volume of blood  received in culture bottles   Culture   Final    NO GROWTH 3 DAYS Performed at [facility], [address] [place], [place] [address]    Report Status PENDING  Incomplete  SARS CORONAVIRUS 2 (TAT 6-24 HRS) Nasopharyngeal Nasopharyngeal Swab     Status: None   Collection Time: 08/15/21  9:40 PM   Specimen: Nasopharyngeal Swab  Result Value Ref Range Status   SARS Coronavirus 2 NEGATIVE NEGATIVE Final    Comment: (NOTE) SARS-CoV-2 target nucleic acids are NOT DETECTED.  The SARS-CoV-2 RNA is generally detectable in upper and lower respiratory specimens during the acute phase of infection. Negative results do not preclude SARS-CoV-2 infection, do not rule out co-infections with other pathogens, and should not be used as the sole basis for treatment or other patient management decisions. Negative results must be combined with clinical observations, patient history, and epidemiological information. The expected result is Negative.  Fact Sheet for Patients: HairSlick.no  Fact Sheet for Healthcare Providers: quierodirigir.com  This test is not yet approved or cleared by the [place] FDA and  has been authorized for detection and/or diagnosis of SARS-CoV-2 by FDA under an Emergency Use Authorization (EUA). This EUA will remain  in effect (meaning this test can be used) for the duration of the COVID-19 declaration under Se ction 564(b)(1) of the Act, 21 U.S.C. section 360bbb-3(b)(1), unless the authorization is terminated or revoked sooner.  Performed at [facility], [address], [place], [place] [address]      Discharge Instructions:   Discharge Instructions     Diet - low sodium heart healthy   Complete by: As directed    Diet Carb Modified   Complete by: As directed    Discharge wound care:   Complete by: As directed    Clean left leg wound with saline, pat dry Cut to fit silver hydrofiber  [place] # [phone]) and place on wound bed Top with foam dressing Change every other day   Increase activity slowly   Complete by: As directed       Allergies as of 08/18/2021       Reactions   Sulfa Antibiotics Other (See Comments)   Unknown reaction   Xarelto [rivaroxaban]         Medication List     STOP taking these medications    amLODipine 5 MG tablet Commonly known as: NORVASC   clindamycin 300 MG capsule Commonly known as: CLEOCIN  latanoprost 0.005 % ophthalmic solution Commonly known as: XALATAN       TAKE these medications    acetaminophen 500 MG tablet Commonly known as: TYLENOL Take 1,000 mg by mouth every 6 (six) hours as needed for mild pain.   amiodarone 200 MG tablet Commonly known as: PACERONE Take 1 tablet (200 mg total) by mouth daily.   aspirin 81 MG tablet Take 81 mg by mouth daily.   atorvastatin 10 MG tablet Commonly known as: LIPITOR Take 10 mg by mouth daily.   benztropine 1 MG tablet Commonly known as: COGENTIN Take 1 mg by mouth at bedtime.   Biofreeze 4 % Gel Generic drug: Menthol (Topical Analgesic) Apply 1 application topically 3 (three) times daily. To lower back area as needed for pain   brimonidine-timolol 0.2-0.5 % ophthalmic solution Commonly known as: COMBIGAN Place 1 drop into both eyes every 12 (twelve) hours. Wait 3 to 5 minutes between drops   carvedilol 6.25 MG tablet Commonly known as: COREG Take 6.25 mg by mouth 2 (two) times daily with a meal.   cefadroxil 500 MG capsule Commonly known as: DURICEF Take 1 capsule (500 mg total) by mouth 2 (two) times daily for 3 days.   citalopram 20 MG tablet Commonly known as: CELEXA Take 20 mg by mouth daily.   diclofenac Sodium 1 % Gel Commonly known as: VOLTAREN Apply 2 g topically 4 (four) times daily as needed. Apply to neck and shoulders What changed:  when to take this reasons to take this   divalproex 500 MG 24 hr tablet Commonly known as: DEPAKOTE  ER Take 2,000 mg by mouth at bedtime. What changed: Another medication with the same name was removed. Continue taking this medication, and follow the directions you see here.   donepezil 10 MG tablet Commonly known as: ARICEPT Take 10 mg by mouth at bedtime.   Eliquis 5 MG Tabs tablet Generic drug: apixaban Take 5 mg by mouth 2 (two) times daily.   EQL Vitamin D3 50 MCG (2000 UT) Caps Generic drug: Cholecalciferol Take 1 capsule by mouth daily.   ferrous sulfate 325 (65 FE) MG tablet Take 325 mg by mouth daily with breakfast.   furosemide 20 MG tablet Commonly known as: LASIX Take 20 mg by mouth daily as needed. For edema (swelling)   lanolin/mineral oil Lotn Apply 1 application topically as needed for dry skin. Apply to face after washing face daily   losartan 25 MG tablet Commonly known as: Cozaar Take 1 tablet (25 mg total) by mouth daily. May increase to 2 tablets by mouth daily if blood pressure goes above 140/90 for more than a day  or if the blood pressure goes above 150/100 at all. What changed: additional instructions   Lumigan 0.01 % Soln Generic drug: bimatoprost Place 1 drop into both eyes at bedtime.   melatonin 5 MG Tabs Take 5 mg by mouth at bedtime.   metFORMIN 500 MG tablet Commonly known as: GLUCOPHAGE Take 500 mg by mouth 2 (two) times daily with a meal.   omeprazole 20 MG capsule Commonly known as: PRILOSEC Take 20 mg by mouth 2 (two) times daily before a meal. May open and sprinkle in applesauce.   oyster calcium 500 MG Tabs tablet Take 500 mg of elemental calcium by mouth 2 (two) times daily.   risperidone 4 MG tablet Commonly known as: RISPERDAL Take 4 mg by mouth at bedtime.   vitamin C 250 MG tablet Commonly known as: ASCORBIC ACID  Take 1 tablet (250 mg total) by mouth daily.               Discharge Care Instructions  (From admission, onward)           Start     Ordered   08/18/21 0000  Discharge wound care:        Comments: Clean left leg wound with saline, pat dry Cut to fit silver hydrofiber [place] # [phone]) and place on wound bed Top with foam dressing Change every other day   08/18/21 1052               If you experience worsening of your admission symptoms, develop shortness of breath, life threatening emergency, suicidal or homicidal thoughts you must seek medical attention immediately by calling 911 or calling your MD immediately  if symptoms less severe.   You must read complete instructions/literature along with all the possible adverse reactions/side effects for all the medicines you take and that have been prescribed to you. Take any new medicines after you have completely understood and accept all the possible adverse reactions/side effects.    Please note   You were cared for by a hospitalist during your hospital stay. If you have any questions about your discharge medications or the care you received while you were in the hospital after you are discharged, you can call the unit and asked to speak with the hospitalist on call if the hospitalist that took care of you is not available. Once you are discharged, your primary care physician will handle any further medical issues. Please note that NO REFILLS for any discharge medications will be authorized once you are discharged, as it is imperative that you return to your primary care physician (or establish a relationship with a primary care physician if you do not have one) for your aftercare needs so that they can reassess your need for medications and monitor your lab values.       Time coordinating discharge: 32 minutes  Signed:  Yoceline Bazar  [facility] 08/18/2021, 10:53 AM   Pager on [email or web address]. If 7PM-7AM, please contact night-coverage at [email or web address]

## 2021-08-18 NOTE — Plan of Care (Signed)
DISCHARGE NOTE SNF Brian Dodson to be discharged [facility] per MD order. Patient verbalized understanding.  Skin clean, dry and intact without evidence of skin break down, no evidence of skin tears noted. IV catheter discontinued intact. Site without signs and symptoms of complications. Dressing and pressure applied. Pt denies pain at the site currently. No complaints noted.  Patient free of lines, drains, and wounds.   Discharge packet assembled. An After Visit Summary (AVS) was printed and given to the EMS personnel. Patient escorted via stretcher and discharged to [facility] via ambulance. Report called to accepting facility; all questions and concerns addressed.   [name], RN

## 2021-08-20 LAB — CULTURE, BLOOD (ROUTINE X 2)
Culture: NO GROWTH
Culture: NO GROWTH

## 2021-09-17 ENCOUNTER — Emergency Department: Payer: Medicare Other

## 2021-09-17 ENCOUNTER — Encounter: Payer: Self-pay | Admitting: Radiology

## 2021-09-17 ENCOUNTER — Inpatient Hospital Stay
Admission: EM | Admit: 2021-09-17 | Discharge: 2021-09-27 | DRG: 871 | Disposition: A | Discharge status: Skilled Nursing Facility | Payer: Medicare Other | Source: Skilled Nursing Facility | Attending: Obstetrics and Gynecology | Admitting: Obstetrics and Gynecology

## 2021-09-17 ENCOUNTER — Other Ambulatory Visit: Payer: Self-pay

## 2021-09-17 DIAGNOSIS — E785 Hyperlipidemia, unspecified: Secondary | ICD-10-CM | POA: Diagnosis present

## 2021-09-17 DIAGNOSIS — R7881 Bacteremia: Secondary | ICD-10-CM | POA: Diagnosis not present

## 2021-09-17 DIAGNOSIS — K621 Rectal polyp: Secondary | ICD-10-CM | POA: Diagnosis present

## 2021-09-17 DIAGNOSIS — A409 Streptococcal sepsis, unspecified: Secondary | ICD-10-CM

## 2021-09-17 DIAGNOSIS — H409 Unspecified glaucoma: Secondary | ICD-10-CM | POA: Diagnosis present

## 2021-09-17 DIAGNOSIS — Z20822 Contact with and (suspected) exposure to covid-19: Secondary | ICD-10-CM | POA: Diagnosis present

## 2021-09-17 DIAGNOSIS — J449 Chronic obstructive pulmonary disease, unspecified: Secondary | ICD-10-CM | POA: Diagnosis present

## 2021-09-17 DIAGNOSIS — E1165 Type 2 diabetes mellitus with hyperglycemia: Secondary | ICD-10-CM | POA: Diagnosis present

## 2021-09-17 DIAGNOSIS — D638 Anemia in other chronic diseases classified elsewhere: Secondary | ICD-10-CM | POA: Diagnosis present

## 2021-09-17 DIAGNOSIS — R32 Unspecified urinary incontinence: Secondary | ICD-10-CM | POA: Diagnosis present

## 2021-09-17 DIAGNOSIS — G9341 Metabolic encephalopathy: Secondary | ICD-10-CM | POA: Diagnosis present

## 2021-09-17 DIAGNOSIS — I2510 Atherosclerotic heart disease of native coronary artery without angina pectoris: Secondary | ICD-10-CM | POA: Diagnosis present

## 2021-09-17 DIAGNOSIS — R4189 Other symptoms and signs involving cognitive functions and awareness: Secondary | ICD-10-CM | POA: Diagnosis present

## 2021-09-17 DIAGNOSIS — K640 First degree hemorrhoids: Secondary | ICD-10-CM | POA: Diagnosis present

## 2021-09-17 DIAGNOSIS — A408 Other streptococcal sepsis: Secondary | ICD-10-CM | POA: Diagnosis present

## 2021-09-17 DIAGNOSIS — R509 Fever, unspecified: Secondary | ICD-10-CM | POA: Diagnosis present

## 2021-09-17 DIAGNOSIS — F209 Schizophrenia, unspecified: Secondary | ICD-10-CM | POA: Diagnosis present

## 2021-09-17 DIAGNOSIS — E119 Type 2 diabetes mellitus without complications: Secondary | ICD-10-CM

## 2021-09-17 DIAGNOSIS — Z87891 Personal history of nicotine dependence: Secondary | ICD-10-CM

## 2021-09-17 DIAGNOSIS — K219 Gastro-esophageal reflux disease without esophagitis: Secondary | ICD-10-CM | POA: Diagnosis present

## 2021-09-17 DIAGNOSIS — I700 Atherosclerosis of aorta: Secondary | ICD-10-CM | POA: Diagnosis present

## 2021-09-17 DIAGNOSIS — N401 Enlarged prostate with lower urinary tract symptoms: Secondary | ICD-10-CM | POA: Diagnosis present

## 2021-09-17 DIAGNOSIS — I10 Essential (primary) hypertension: Secondary | ICD-10-CM | POA: Diagnosis present

## 2021-09-17 DIAGNOSIS — B955 Unspecified streptococcus as the cause of diseases classified elsewhere: Secondary | ICD-10-CM | POA: Diagnosis present

## 2021-09-17 DIAGNOSIS — Z6829 Body mass index (BMI) 29.0-29.9, adult: Secondary | ICD-10-CM | POA: Diagnosis not present

## 2021-09-17 DIAGNOSIS — G9340 Encephalopathy, unspecified: Secondary | ICD-10-CM

## 2021-09-17 DIAGNOSIS — Z452 Encounter for adjustment and management of vascular access device: Secondary | ICD-10-CM

## 2021-09-17 DIAGNOSIS — R6520 Severe sepsis without septic shock: Secondary | ICD-10-CM | POA: Diagnosis present

## 2021-09-17 DIAGNOSIS — I480 Paroxysmal atrial fibrillation: Secondary | ICD-10-CM | POA: Diagnosis present

## 2021-09-17 DIAGNOSIS — Z7982 Long term (current) use of aspirin: Secondary | ICD-10-CM

## 2021-09-17 DIAGNOSIS — N170 Acute kidney failure with tubular necrosis: Secondary | ICD-10-CM | POA: Diagnosis not present

## 2021-09-17 DIAGNOSIS — N179 Acute kidney failure, unspecified: Secondary | ICD-10-CM | POA: Diagnosis present

## 2021-09-17 DIAGNOSIS — A419 Sepsis, unspecified organism: Secondary | ICD-10-CM | POA: Diagnosis not present

## 2021-09-17 DIAGNOSIS — F0390 Unspecified dementia without behavioral disturbance: Secondary | ICD-10-CM | POA: Diagnosis present

## 2021-09-17 DIAGNOSIS — Z7901 Long term (current) use of anticoagulants: Secondary | ICD-10-CM | POA: Diagnosis not present

## 2021-09-17 DIAGNOSIS — Z833 Family history of diabetes mellitus: Secondary | ICD-10-CM

## 2021-09-17 DIAGNOSIS — Z955 Presence of coronary angioplasty implant and graft: Secondary | ICD-10-CM

## 2021-09-17 LAB — COMPREHENSIVE METABOLIC PANEL
ALT: 15 U/L (ref 0–44)
AST: 22 U/L (ref 15–41)
Albumin: 3.4 g/dL — ABNORMAL LOW (ref 3.5–5.0)
Alkaline Phosphatase: 44 U/L (ref 38–126)
Anion gap: 9 (ref 5–15)
BUN: 25 mg/dL — ABNORMAL HIGH (ref 8–23)
CO2: 26 mmol/L (ref 22–32)
Calcium: 8.9 mg/dL (ref 8.9–10.3)
Chloride: 101 mmol/L (ref 98–111)
Creatinine, Ser: 1.5 mg/dL — ABNORMAL HIGH (ref 0.61–1.24)
GFR, Estimated: 51 mL/min — ABNORMAL LOW (ref >60)
Glucose, Bld: 108 mg/dL — ABNORMAL HIGH (ref 70–99)
Potassium: 4 mmol/L (ref 3.5–5.1)
Sodium: 136 mmol/L (ref 135–145)
Total Bilirubin: 0.7 mg/dL (ref 0.3–1.2)
Total Protein: 7.1 g/dL (ref 6.5–8.1)

## 2021-09-17 LAB — CBC WITH DIFFERENTIAL/PLATELET
Abs Immature Granulocytes: 0.1 10*3/uL — ABNORMAL HIGH (ref 0.00–0.07)
Basophils Absolute: 0 10*3/uL (ref 0.0–0.1)
Basophils Relative: 0 %
Eosinophils Absolute: 0 10*3/uL (ref 0.0–0.5)
Eosinophils Relative: 0 %
HCT: 30.6 % — ABNORMAL LOW (ref 39.0–52.0)
Hemoglobin: 10.3 g/dL — ABNORMAL LOW (ref 13.0–17.0)
Immature Granulocytes: 1 %
Lymphocytes Relative: 8 %
Lymphs Abs: 0.9 10*3/uL (ref 0.7–4.0)
MCH: 34.4 pg — ABNORMAL HIGH (ref 26.0–34.0)
MCHC: 33.7 g/dL (ref 30.0–36.0)
MCV: 102.3 fL — ABNORMAL HIGH (ref 80.0–100.0)
Monocytes Absolute: 0.8 10*3/uL (ref 0.1–1.0)
Monocytes Relative: 8 %
Neutro Abs: 8.6 10*3/uL — ABNORMAL HIGH (ref 1.7–7.7)
Neutrophils Relative %: 83 %
Platelets: 121 10*3/uL — ABNORMAL LOW (ref 150–400)
RBC: 2.99 MIL/uL — ABNORMAL LOW (ref 4.22–5.81)
RDW: 14.4 % (ref 11.5–15.5)
WBC: 10.3 10*3/uL (ref 4.0–10.5)
nRBC: 0 % (ref 0.0–0.2)

## 2021-09-17 LAB — HEMOGLOBIN A1C
Hgb A1c MFr Bld: 6 % — ABNORMAL HIGH (ref 4.8–5.6)
Mean Plasma Glucose: 125.5 mg/dL

## 2021-09-17 LAB — BLOOD CULTURE ID PANEL (REFLEXED) - BCID2

## 2021-09-17 LAB — RESP PANEL BY RT-PCR (FLU A&B, COVID) ARPGX2
Influenza A by PCR: NEGATIVE
Influenza B by PCR: NEGATIVE
SARS Coronavirus 2 by RT PCR: NEGATIVE

## 2021-09-17 LAB — URINALYSIS, COMPLETE (UACMP) WITH MICROSCOPIC
Bacteria, UA: NONE SEEN
Bilirubin Urine: NEGATIVE
Glucose, UA: NEGATIVE mg/dL
Hgb urine dipstick: NEGATIVE
Ketones, ur: 5 mg/dL — AB
Leukocytes,Ua: NEGATIVE
Nitrite: NEGATIVE
Protein, ur: 30 mg/dL — AB
Specific Gravity, Urine: 1.023 (ref 1.005–1.030)
pH: 7 (ref 5.0–8.0)

## 2021-09-17 LAB — LACTIC ACID, PLASMA: Lactic Acid, Venous: 1.7 mmol/L (ref 0.5–1.9)

## 2021-09-17 LAB — PROCALCITONIN: Procalcitonin: 0.24 ng/mL

## 2021-09-17 LAB — PROTIME-INR
INR: 1.2 (ref 0.8–1.2)
Prothrombin Time: 14.7 seconds (ref 11.4–15.2)

## 2021-09-17 LAB — CBG MONITORING, ED
Glucose-Capillary: 107 mg/dL — ABNORMAL HIGH (ref 70–99)
Glucose-Capillary: 78 mg/dL (ref 70–99)
Glucose-Capillary: 95 mg/dL (ref 70–99)
Glucose-Capillary: 121 mg/dL — ABNORMAL HIGH (ref 70–99)

## 2021-09-17 LAB — TROPONIN I (HIGH SENSITIVITY): Troponin I (High Sensitivity): 11 ng/L (ref <18)

## 2021-09-17 LAB — LIPASE, BLOOD: Lipase: 35 U/L (ref 11–51)

## 2021-09-17 LAB — APTT: aPTT: 34 seconds (ref 24–36)

## 2021-09-17 MED ORDER — INSULIN ASPART 100 UNIT/ML IJ SOLN
0.0000 [IU] | INTRAMUSCULAR | Status: DC
  Administered 2021-09-18: 5 [IU] via SUBCUTANEOUS
  Administered 2021-09-19 (×2): 2 [IU] via SUBCUTANEOUS
  Administered 2021-09-19: 3 [IU] via SUBCUTANEOUS
  Administered 2021-09-20 (×3): 2 [IU] via SUBCUTANEOUS
  Administered 2021-09-23: 5 [IU] via SUBCUTANEOUS
  Administered 2021-09-23 – 2021-09-24 (×2): 3 [IU] via SUBCUTANEOUS
  Administered 2021-09-24: 2 [IU] via SUBCUTANEOUS
  Administered 2021-09-25: 5 [IU] via SUBCUTANEOUS
  Administered 2021-09-26: 3 [IU] via SUBCUTANEOUS
  Administered 2021-09-26 – 2021-09-27 (×2): 2 [IU] via SUBCUTANEOUS
  Administered 2021-09-27: 3 [IU] via SUBCUTANEOUS
  Filled 2021-09-17 (×16): qty 1

## 2021-09-17 MED ORDER — RISPERIDONE 3 MG PO TABS
4.0000 mg | ORAL_TABLET | Freq: Every day | ORAL | Status: DC
  Administered 2021-09-17 – 2021-09-26 (×10): 4 mg via ORAL
  Filled 2021-09-17 (×3): qty 1
  Filled 2021-09-17: qty 4
  Filled 2021-09-17 (×7): qty 1

## 2021-09-17 MED ORDER — ATORVASTATIN CALCIUM 10 MG PO TABS
10.0000 mg | ORAL_TABLET | Freq: Every day | ORAL | Status: DC
  Administered 2021-09-17 – 2021-09-27 (×10): 10 mg via ORAL
  Filled 2021-09-17 (×10): qty 1

## 2021-09-17 MED ORDER — LACTATED RINGERS IV BOLUS
1000.0000 mL | Freq: Once | INTRAVENOUS | Status: AC
  Administered 2021-09-17: 1000 mL via INTRAVENOUS

## 2021-09-17 MED ORDER — CITALOPRAM HYDROBROMIDE 20 MG PO TABS
20.0000 mg | ORAL_TABLET | Freq: Every day | ORAL | Status: DC
  Administered 2021-09-17 – 2021-09-27 (×10): 20 mg via ORAL
  Filled 2021-09-17 (×10): qty 1

## 2021-09-17 MED ORDER — SODIUM CHLORIDE 0.9 % IV SOLN
2.0000 g | INTRAVENOUS | Status: DC
  Administered 2021-09-17: 2 g via INTRAVENOUS
  Filled 2021-09-17: qty 20

## 2021-09-17 MED ORDER — DIVALPROEX SODIUM ER 500 MG PO TB24
2000.0000 mg | ORAL_TABLET | Freq: Every day | ORAL | Status: DC
  Administered 2021-09-17 – 2021-09-26 (×10): 2000 mg via ORAL
  Filled 2021-09-17 (×7): qty 4
  Filled 2021-09-17: qty 8
  Filled 2021-09-17 (×3): qty 4

## 2021-09-17 MED ORDER — MELATONIN 5 MG PO TABS
5.0000 mg | ORAL_TABLET | Freq: Every day | ORAL | Status: DC
  Administered 2021-09-17 – 2021-09-26 (×10): 5 mg via ORAL
  Filled 2021-09-17 (×10): qty 1

## 2021-09-17 MED ORDER — DONEPEZIL HCL 5 MG PO TABS
10.0000 mg | ORAL_TABLET | Freq: Every day | ORAL | Status: DC
  Administered 2021-09-17 – 2021-09-26 (×10): 10 mg via ORAL
  Filled 2021-09-17 (×11): qty 2

## 2021-09-17 MED ORDER — TIMOLOL MALEATE 0.5 % OP SOLN
1.0000 [drp] | Freq: Two times a day (BID) | OPHTHALMIC | Status: DC
  Administered 2021-09-17 – 2021-09-27 (×20): 1 [drp] via OPHTHALMIC
  Filled 2021-09-17: qty 5

## 2021-09-17 MED ORDER — SODIUM CHLORIDE 0.9 % IV SOLN
2.0000 g | INTRAVENOUS | Status: DC
  Administered 2021-09-17 – 2021-09-18 (×9): 2 g via INTRAVENOUS
  Filled 2021-09-17: qty 2
  Filled 2021-09-17 (×15): qty 2000

## 2021-09-17 MED ORDER — ONDANSETRON HCL 4 MG/2ML IJ SOLN: 4.0000 mg | Freq: Four times a day (QID) | INTRAMUSCULAR | Status: DC | PRN

## 2021-09-17 MED ORDER — VANCOMYCIN HCL 500 MG/100ML IV SOLN
500.0000 mg | Freq: Once | INTRAVENOUS | Status: AC
  Administered 2021-09-17: 500 mg via INTRAVENOUS
  Filled 2021-09-17: qty 100

## 2021-09-17 MED ORDER — LACTATED RINGERS IV BOLUS (SEPSIS)
1000.0000 mL | Freq: Once | INTRAVENOUS | Status: AC
  Administered 2021-09-17: 1000 mL via INTRAVENOUS

## 2021-09-17 MED ORDER — SODIUM CHLORIDE 0.9 % IV SOLN: 2.0000 g | Freq: Once | INTRAVENOUS | Status: DC

## 2021-09-17 MED ORDER — APIXABAN 5 MG PO TABS: 5.0000 mg | ORAL_TABLET | Freq: Two times a day (BID) | ORAL | Status: DC

## 2021-09-17 MED ORDER — BENZTROPINE MESYLATE 1 MG PO TABS
1.0000 mg | ORAL_TABLET | Freq: Every day | ORAL | Status: DC
  Administered 2021-09-17 – 2021-09-26 (×10): 1 mg via ORAL
  Filled 2021-09-17 (×11): qty 1

## 2021-09-17 MED ORDER — VANCOMYCIN HCL IN DEXTROSE 1-5 GM/200ML-% IV SOLN: 1000.0000 mg | Freq: Once | INTRAVENOUS | Status: DC

## 2021-09-17 MED ORDER — LOSARTAN POTASSIUM 50 MG PO TABS
25.0000 mg | ORAL_TABLET | Freq: Every day | ORAL | Status: DC
  Administered 2021-09-17: 25 mg via ORAL
  Filled 2021-09-17: qty 1

## 2021-09-17 MED ORDER — BRIMONIDINE TARTRATE 0.2 % OP SOLN
1.0000 [drp] | Freq: Two times a day (BID) | OPHTHALMIC | Status: DC
  Administered 2021-09-17 – 2021-09-27 (×21): 1 [drp] via OPHTHALMIC
  Filled 2021-09-17 (×2): qty 5

## 2021-09-17 MED ORDER — LACTATED RINGERS IV SOLN: INTRAVENOUS | Status: DC

## 2021-09-17 MED ORDER — VITAMIN D3 25 MCG (1000 UNIT) PO TABS
2000.0000 [IU] | ORAL_TABLET | Freq: Every day | ORAL | Status: DC
  Administered 2021-09-17 – 2021-09-27 (×10): 2000 [IU] via ORAL
  Filled 2021-09-17 (×20): qty 2

## 2021-09-17 MED ORDER — LATANOPROST 0.005 % OP SOLN
1.0000 [drp] | Freq: Every day | OPHTHALMIC | Status: DC
  Administered 2021-09-17 – 2021-09-26 (×9): 1 [drp] via OPHTHALMIC
  Filled 2021-09-17 (×3): qty 2.5

## 2021-09-17 MED ORDER — ACETAMINOPHEN 500 MG PO TABS
1000.0000 mg | ORAL_TABLET | Freq: Once | ORAL | Status: AC
  Administered 2021-09-17: 1000 mg via ORAL
  Filled 2021-09-17: qty 2

## 2021-09-17 MED ORDER — VANCOMYCIN HCL 2000 MG/400ML IV SOLN
2000.0000 mg | Freq: Once | INTRAVENOUS | Status: AC
  Administered 2021-09-17: 2000 mg via INTRAVENOUS
  Filled 2021-09-17: qty 400

## 2021-09-17 MED ORDER — ASPIRIN EC 81 MG PO TBEC
81.0000 mg | DELAYED_RELEASE_TABLET | Freq: Every day | ORAL | Status: DC
  Administered 2021-09-17 – 2021-09-27 (×10): 81 mg via ORAL
  Filled 2021-09-17 (×10): qty 1

## 2021-09-17 MED ORDER — BRIMONIDINE TARTRATE-TIMOLOL 0.2-0.5 % OP SOLN
1.0000 [drp] | Freq: Two times a day (BID) | OPHTHALMIC | Status: DC
  Filled 2021-09-17: qty 5

## 2021-09-17 MED ORDER — PANTOPRAZOLE SODIUM 40 MG PO TBEC
40.0000 mg | DELAYED_RELEASE_TABLET | Freq: Two times a day (BID) | ORAL | Status: DC
  Administered 2021-09-17 – 2021-09-27 (×20): 40 mg via ORAL
  Filled 2021-09-17 (×20): qty 1

## 2021-09-17 MED ORDER — AMIODARONE HCL 200 MG PO TABS
200.0000 mg | ORAL_TABLET | Freq: Every day | ORAL | Status: DC
  Administered 2021-09-17 – 2021-09-27 (×10): 200 mg via ORAL
  Filled 2021-09-17 (×10): qty 1

## 2021-09-17 MED ORDER — ACETAMINOPHEN 325 MG PO TABS
650.0000 mg | ORAL_TABLET | Freq: Four times a day (QID) | ORAL | Status: DC | PRN
  Administered 2021-09-17: 650 mg via ORAL
  Filled 2021-09-17: qty 2

## 2021-09-17 MED ORDER — IOHEXOL 300 MG/ML  SOLN
80.0000 mL | Freq: Once | INTRAMUSCULAR | Status: AC | PRN
  Administered 2021-09-17: 80 mL via INTRAVENOUS

## 2021-09-17 MED ORDER — ONDANSETRON HCL 4 MG PO TABS: 4.0000 mg | ORAL_TABLET | Freq: Four times a day (QID) | ORAL | Status: DC | PRN

## 2021-09-17 MED ORDER — SODIUM CHLORIDE 0.9 % IV SOLN
2.0000 g | Freq: Once | INTRAVENOUS | Status: DC
  Filled 2021-09-17: qty 2000

## 2021-09-17 MED ORDER — CARVEDILOL 6.25 MG PO TABS
6.2500 mg | ORAL_TABLET | Freq: Two times a day (BID) | ORAL | Status: DC
  Administered 2021-09-17: 6.25 mg via ORAL
  Filled 2021-09-17: qty 1

## 2021-09-17 MED ORDER — FERROUS SULFATE 325 (65 FE) MG PO TABS
325.0000 mg | ORAL_TABLET | Freq: Every day | ORAL | Status: DC
  Administered 2021-09-18 – 2021-09-27 (×9): 325 mg via ORAL
  Filled 2021-09-17 (×10): qty 1

## 2021-09-17 MED ORDER — VANCOMYCIN HCL 750 MG/150ML IV SOLN
750.0000 mg | Freq: Two times a day (BID) | INTRAVENOUS | Status: DC
  Administered 2021-09-17 – 2021-09-18 (×2): 750 mg via INTRAVENOUS
  Filled 2021-09-17 (×4): qty 150

## 2021-09-17 MED ORDER — ACETAMINOPHEN 650 MG RE SUPP
650.0000 mg | Freq: Four times a day (QID) | RECTAL | Status: DC | PRN
  Filled 2021-09-17: qty 1

## 2021-09-17 MED ORDER — SODIUM CHLORIDE 0.9 % IV SOLN
2.0000 g | Freq: Two times a day (BID) | INTRAVENOUS | Status: DC
  Administered 2021-09-17 – 2021-09-18 (×3): 2 g via INTRAVENOUS
  Filled 2021-09-17 (×5): qty 20

## 2021-09-17 NOTE — Progress Notes (Signed)
Patient seen and examined at the bedside.  Mental status appears to be back to baseline.  Blood culture is growing Streptococcus species.  LP has been ordered for further evaluation.  Continue empiric IV antibiotics.

## 2021-09-17 NOTE — Progress Notes (Signed)
Pharmacy Antibiotic Note  Brian Dodson is a 65 y.o. male admitted on 09/17/2021 with meningitis.  Pharmacy has been consulted for Vancomycin, Ampicillin dosing. Cannot use AUC dosing for Vanc, will use nomogram instead.   Plan: Ampicillin 2 gm IV Q4H ordered to start on 10/30 @ 0630.  Vancomycin 2500 mg IV X 1 ordered for 10/30 @ ~ 0700. Vancomycin 750 mg IV Q12H ordered to start on 10/30 @ ~ 1900.    Ceftriaxone 2 gm IV Q12H also ordered.   Weight: 102.8 kg (226 lb 9.6 oz)  Temp (24hrs), Avg:103.2 F (39.6 C), Min:103.1 F (39.5 C), Max:103.3 F (39.6 C)  Recent Labs  Lab 09/17/21 0200  WBC 10.3  CREATININE 1.50*  LATICACIDVEN 1.7    Estimated Creatinine Clearance: 61.1 mL/min (A) (by C-G formula based on SCr of 1.5 mg/dL (H)).    Allergies  Allergen Reactions   Sulfa Antibiotics Other (See Comments)    Unknown reaction   Xarelto [Rivaroxaban]     Antimicrobials this admission:   >>    >>   Dose adjustments this admission:   Microbiology results:  BCx:   UCx:    Sputum:   MRSA PCR:   Thank you for allowing pharmacy to be a part of this patient's care.  Brian Dodson 09/17/2021 6:24 AM

## 2021-09-17 NOTE — ED Notes (Signed)
Patient placed into hospital bed.

## 2021-09-17 NOTE — ED Triage Notes (Signed)
Patient BIB [facility] from [facility].  Per report, staff called EMS due to changes in mental status and incontinences of urine.  Patient verbal and continent at baseline.  Fever noted by EMS.

## 2021-09-17 NOTE — H&P (Signed)
History and Physical    Brian Dodson ZOX:[id number] DOB: 1956/06/30 DOA: 09/17/2021  PCP: Galvin Proffer, MD   Patient coming from: SNF  I have personally briefly reviewed patient's relevant medical records in [facility]  Chief Complaint: altered mental status  HPI: Brian Dodson is a 65 y.o. male with medical history significant for Paroxysmal A. fib on Eliquis,, HTN, DM, CAD, cognitive impairment, schizophrenia, sent from SNF with altered mental status.  At baseline patient is alert and verbal but he was noted to be less interactive than usual and somnolent.  Patient was hospitalized twice in September, from 9/3-9/7 with severe sepsis of unknown source presenting with altered mental status and fever, with group G strep bacteremia on blood cultures, and then again from 9/27-9/30 with cellulitis from a chronic left leg wound.  Patient had been at his baseline until earlier in the day when he was found to have fever with shaking chills and somnolence.  History is limited due to somnolence and altered mental status  ED course: On arrival febrile at 103.3, tachypneic to 29 with pulse 63 O2 sat 98% on room air WBC 10,000 with lactic acid 1.7, procalcitonin 0.24 Creatinine 1.50 up from baseline of 0.88 Lipase and LFTs within normal limits Troponin 11 Urinalysis unremarkable COVID and flu negative  Imaging: CT head nonacute. CT chest abdomen and pelvis with contrast nonacute  EKG, personally viewed and interpreted: Sinus rhythm at 65 with nonspecific ST-T wave changes  Patient started on ampicillin ceftriaxone and vancomycin for possible meningitis and hospitalist consulted for admission.    Review of Systems: Unable to obtain due to altered mental status  Past Medical History:  Diagnosis Date   Asthma    Atrial fibrillation (HCC)    Benign prostatic hypertrophy with lower urinary tract symptoms (LUTS)    Bulbous urethral stricture    CAD (coronary artery disease)    s/p  coronary stent 2003   Constipation    COPD (chronic obstructive pulmonary disease) (HCC)    Diabetes mellitus without complication (HCC)    Dizziness    Dyspnea    GERD (gastroesophageal reflux disease)    Gross hematuria    Hyperlipemia    Hypertension    Lumbago    Palpitation    Schizophrenia (HCC)     Past Surgical History:  Procedure Laterality Date   COLONOSCOPY     CORONARY ANGIOPLASTY WITH STENT PLACEMENT  11/2003   kidney stent       reports that he has quit smoking. He has never used smokeless tobacco. He reports that he does not drink alcohol and does not use drugs.  Allergies  Allergen Reactions   Sulfa Antibiotics Other (See Comments)    Unknown reaction   Xarelto [Rivaroxaban]     Family History  Problem Relation Age of Onset   Diabetes Other    Bladder Cancer Neg Hx    Kidney cancer Neg Hx    Prostate cancer Neg Hx       Prior to Admission medications   Medication Sig Start Date End Date Taking? Authorizing Provider  acetaminophen (TYLENOL) 500 MG tablet Take 1,000 mg by mouth every 6 (six) hours as needed for mild pain. Patient not taking: Reported on 07/22/2021    [provider]  amiodarone (PACERONE) 200 MG tablet Take 1 tablet (200 mg total) by mouth daily. 07/26/21   Charise Killian, MD  aspirin 81 MG tablet Take 81 mg by mouth daily.  [provider]  atorvastatin (LIPITOR) 10 MG tablet Take 10 mg by mouth daily. 05/18/15   [provider]  benztropine (COGENTIN) 1 MG tablet Take 1 mg by mouth at bedtime.     [provider]  brimonidine-timolol (COMBIGAN) 0.2-0.5 % ophthalmic solution Place 1 drop into both eyes every 12 (twelve) hours. Wait 3 to 5 minutes between drops    [provider]  carvedilol (COREG) 6.25 MG tablet Take 6.25 mg by mouth 2 (two) times daily with a meal. 07/05/21   [provider]  Cholecalciferol (EQL VITAMIN D3) 50 MCG (2000 UT) CAPS Take 1 capsule by mouth daily.     [provider]  citalopram (CELEXA) 20 MG tablet Take 20 mg by mouth daily.    [provider]  diclofenac Sodium (VOLTAREN) 1 % GEL Apply 2 g topically 4 (four) times daily as needed. Apply to neck and shoulders 08/18/21   Lurene Shadow, MD  divalproex (DEPAKOTE ER) 500 MG 24 hr tablet Take 2,000 mg by mouth at bedtime.     [provider]  donepezil (ARICEPT) 10 MG tablet Take 10 mg by mouth at bedtime.     [provider]  ELIQUIS 5 MG TABS tablet Take 5 mg by mouth 2 (two) times daily. 05/18/15   [provider]  ferrous sulfate 325 (65 FE) MG tablet Take 325 mg by mouth daily with breakfast.    [provider]  furosemide (LASIX) 20 MG tablet Take 20 mg by mouth daily as needed. For edema (swelling) 06/22/21   [provider]  lanolin/mineral oil (KERI/THERA-DERM) LOTN Apply 1 application topically as needed for dry skin. Apply to face after washing face daily    [provider]  losartan (COZAAR) 25 MG tablet Take 1 tablet (25 mg total) by mouth daily. May increase to 2 tablets by mouth daily if blood pressure goes above 140/90 for more than a day  or if the blood pressure goes above 150/100 at all. Patient taking differently: Take 25 mg by mouth daily. 11/18/18 05/03/22  Arnaldo Natal, MD  LUMIGAN 0.01 % SOLN Place 1 drop into both eyes at bedtime. 07/06/21   [provider]  melatonin 5 MG TABS Take 5 mg by mouth at bedtime.    [provider]  Menthol, Topical Analgesic, (BIOFREEZE) 4 % GEL Apply 1 application topically 3 (three) times daily. To lower back area as needed for pain    [provider]  metFORMIN (GLUCOPHAGE) 500 MG tablet Take 500 mg by mouth 2 (two) times daily with a meal.    [provider]  omeprazole (PRILOSEC) 20 MG capsule Take 20 mg by mouth 2 (two) times daily before a meal. May open and sprinkle in applesauce.    [provider]  [name] (OYSTER  CALCIUM) 500 MG TABS tablet Take 500 mg of elemental calcium by mouth 2 (two) times daily.    [provider]  risperidone (RISPERDAL) 4 MG tablet Take 4 mg by mouth at bedtime. 05/18/15   [provider]  vitamin C (ASCORBIC ACID) 250 MG tablet Take 1 tablet (250 mg total) by mouth daily. 03/05/21   Drema Dallas, MD    Physical Exam: Vitals:   09/17/21 0200 09/17/21 0215 09/17/21 0223 09/17/21 0510  BP: 128/68 134/68  123/69  Pulse: 63 73  61  Resp: (!) 24 (!) 22  20  Temp:    (!) 103.1 F (39.5 C)  TempSrc:    Oral  SpO2: 100% 99%  98%  Weight:   102.8 kg    Constitutional: Somnolent and will arouse but will readily falls back asleep . Not in any apparent distress HEENT:      Head: Normocephalic and atraumatic.         Eyes: PERLA, EOMI, Conjunctivae are normal. Sclera is non-icteric.       Mouth/Throat: Mucous membranes are moist.       Neck: Supple with no signs of meningismus. Cardiovascular: Regular rate and rhythm. No murmurs, gallops, or rubs. 2+ symmetrical distal pulses are present . No JVD. No  LE edema Respiratory: Respiratory effort normal .Lungs sounds clear bilaterally. No wheezes, crackles, or rhonchi.  Gastrointestinal: Soft, non tender, non distended. Positive bowel sounds.  Genitourinary: No CVA tenderness. Musculoskeletal: Nontender with normal range of motion in all extremities. No cyanosis, or erythema of extremities. Neurologic:  Face is symmetric. Moving all extremities. No gross focal neurologic deficits . Skin: Skin is warm, dry.  No rash or ulcers Psychiatric: Mood and affect are appropriate    Labs on Admission: I have personally reviewed following labs and imaging studies  CBC: Recent Labs  Lab 09/17/21 0200  WBC 10.3  NEUTROABS 8.6*  HGB 10.3*  HCT 30.6*  MCV 102.3*  PLT 121*   Basic Metabolic Panel: Recent Labs  Lab 09/17/21 0200  NA 136  K 4.0  CL 101  CO2 26  GLUCOSE 108*  BUN 25*  CREATININE 1.50*  CALCIUM  8.9   GFR: Estimated Creatinine Clearance: 61.1 mL/min (A) (by C-G formula based on SCr of 1.5 mg/dL (H)). Liver Function Tests: Recent Labs  Lab 09/17/21 0200  AST 22  ALT 15  ALKPHOS 44  BILITOT 0.7  PROT 7.1  ALBUMIN 3.4*   Recent Labs  Lab 09/17/21 0200  LIPASE 35   No results for input(s): AMMONIA in the last 168 hours. Coagulation Profile: Recent Labs  Lab 09/17/21 0200  INR 1.2   Cardiac Enzymes: No results for input(s): CKTOTAL, CKMB, CKMBINDEX, TROPONINI in the last 168 hours. BNP (last 3 results) No results for input(s): PROBNP in the last 8760 hours. HbA1C: No results for input(s): HGBA1C in the last 72 hours. CBG: No results for input(s): GLUCAP in the last 168 hours. Lipid Profile: No results for input(s): CHOL, HDL, LDLCALC, TRIG, CHOLHDL, LDLDIRECT in the last 72 hours. Thyroid Function Tests: No results for input(s): TSH, T4TOTAL, FREET4, T3FREE, THYROIDAB in the last 72 hours. Anemia Panel: No results for input(s): VITAMINB12, FOLATE, FERRITIN, TIBC, IRON, RETICCTPCT in the last 72 hours. Urine analysis:    Component Value Date/Time   COLORURINE YELLOW (A) 09/17/2021 0200   APPEARANCEUR CLEAR (A) 09/17/2021 0200   APPEARANCEUR Clear 03/14/2016 1027   LABSPEC 1.023 09/17/2021 0200   LABSPEC 1.017 01/29/2014 1545   PHURINE 7.0 09/17/2021 0200   GLUCOSEU NEGATIVE 09/17/2021 0200   GLUCOSEU Negative 01/29/2014 1545   HGBUR NEGATIVE 09/17/2021 0200   BILIRUBINUR NEGATIVE 09/17/2021 0200   BILIRUBINUR Negative 03/14/2016 1027   BILIRUBINUR Negative 01/29/2014 1545   KETONESUR 5 (A) 09/17/2021 0200   PROTEINUR 30 (A) 09/17/2021 0200   NITRITE NEGATIVE 09/17/2021 0200   LEUKOCYTESUR NEGATIVE 09/17/2021 0200   LEUKOCYTESUR Negative 01/29/2014 1545    Radiological Exams on Admission: CT Head Wo Contrast  Result Date: 09/17/2021 CLINICAL DATA:  Mental status change with unknown cause EXAM: CT HEAD WITHOUT CONTRAST TECHNIQUE: Contiguous axial  images were obtained from the base of  the skull through the vertex without intravenous contrast. COMPARISON:  03/03/2021 FINDINGS: Brain: No evidence of acute infarction, hemorrhage, hydrocephalus, extra-axial collection or mass lesion/mass effect. Chronic small vessel ischemia in the hemispheric white matter Vascular: No hyperdense vessel or unexpected calcification. Skull: Normal. Negative for fracture or focal lesion. Sinuses/Orbits: No acute finding. IMPRESSION: 1. No acute or interval finding. 2. Mild chronic small vessel ischemia. Electronically Signed   By: Tiburcio Pea M.D.   On: 09/17/2021 04:07   CT CHEST ABDOMEN PELVIS W CONTRAST  Result Date: 09/17/2021 CLINICAL DATA:  Fever, altered mental status, abdominal distension EXAM: CT CHEST, ABDOMEN, AND PELVIS WITH CONTRAST TECHNIQUE: Multidetector CT imaging of the chest, abdomen and pelvis was performed following the standard protocol during bolus administration of intravenous contrast. CONTRAST:  20mL OMNIPAQUE IOHEXOL 300 MG/ML  SOLN COMPARISON:  CT abdomen/pelvis dated 07/22/2021 FINDINGS: Note: The patient was injected via a left hand IV. 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 As result, the normal contrast timing is significantly delayed, and the study is essentially unenhanced. The delayed study does demonstrate excretory contrast within the bilateral renal collecting systems. CT CHEST FINDINGS Cardiovascular: Mild cardiomegaly.  No pericardial effusion. No evidence of thoracic aortic aneurysm. Mild atherosclerotic calcifications of the aortic arch. Three vessel coronary atherosclerosis. Mediastinum/Nodes: No suspicious mediastinal lymphadenopathy. Visualized thyroid is unremarkable. Lungs/Pleura: Mild dependent atelectasis in the bilateral lower lobes. Evaluation of the lung parenchyma is constrained by respiratory motion. Within that  constraint, there are no suspicious pulmonary nodules. No focal consolidation. No pleural effusion or pneumothorax. Musculoskeletal: Degenerative changes of the lower thoracic spine. CT ABDOMEN PELVIS FINDINGS Hepatobiliary: Liver is within normal limits. Gallbladder is unremarkable. No intrahepatic or extrahepatic ductal dilatation. Pancreas: Within normal limits. Spleen: Within normal limits. Adrenals/Urinary Tract: Adrenal glands are within normal limits. Kidneys are within normal limits.  No hydronephrosis. Thick-walled bladder, although underdistended. Stomach/Bowel: Stomach is within normal limits. No evidence of bowel obstruction. Normal appendix (series 3/image 93). Vascular/Lymphatic: No evidence of abdominal aortic aneurysm. Atherosclerotic calcifications of the abdominal aorta and branch vessels. No suspicious abdominopelvic lymphadenopathy. Reproductive: Prostate is unremarkable. Other: No abdominopelvic ascites. Musculoskeletal: Degenerative changes of the lumbar spine. IMPRESSION: No acute findings in the chest, abdomen, or pelvis. 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 Future enhanced CTs should be done via right arm IV placement. Electronically Signed   By: Charline Bills M.D.   On: 09/17/2021 03:57   DG Chest Port 1 View  Result Date: 09/17/2021 CLINICAL DATA:  Back pain EXAM: PORTABLE CHEST 1 VIEW COMPARISON:  08/15/2021 FINDINGS: Lungs are clear.  No pleural effusion or pneumothorax. The heart is normal in size. IMPRESSION: No evidence of acute cardiopulmonary disease. Electronically Signed   By: Charline Bills M.D.   On: 09/17/2021 02:17    Assessment/Plan    Sepsis of unknown source - Patient presenting with fever and chills and tachypnea, AKI and acute encephalopathy though with normal WBC and lactic acid - History of similar presentation in September 2022 and  was seen by ID - CT chest abdomen and pelvis with contrast all nonacute and urinalysis unremarkable - Patient on empiric antibiotics for possible meningitis pending rule out though physical exam with no meningeal signs - Will hold Eliquis and consult IR for possible spinal tap - Follow blood cultures - Consider  ID consult    AKI (acute kidney injury) (HCC) - Creatinine 1.5 above normal baseline and likely secondary to sepsis - IV hydration and monitor    Acute metabolic encephalopathy - Secondary to sepsis - At baseline patient is interactive - Neurologic checks and aspiration precautions    Diabetes mellitus, type II (HCC) - Sliding scale insulin coverage    CAD (coronary artery disease) - Continue aspirin, atorvastatin, carvedilol    Anemia of chronic disease - Hemoglobin at baseline    AF (paroxysmal atrial fibrillation) (HCC) - Holding Eliquis for possible LP - Continue amiodarone and carvedilol    HTN (hypertension) - Continue carvedilol, losartan  Cognitive deficit/dementia Schizophrenia - Continue risperidone, donepezil, divalproex  Glaucoma - Continue drops    DVT prophylaxis: SCD Code Status: full code  Family Communication:  none  Disposition Plan: Back to previous home environment Consults called: none  Status:At the time of admission, it appears that the appropriate admission status for this patient is INPATIENT. This is judged to be reasonable and necessary in order to provide the required intensity of service to ensure the patient's safety given the presenting symptoms, physical exam findings, and initial radiographic and laboratory data in the context of their  Comorbid conditions.   Patient requires inpatient status due to high intensity of service, high risk for further deterioration and high frequency of surveillance required.   I certify that at the point of admission it is my clinical judgment that the patient will require inpatient hospital care  spanning beyond 2 midnights    Andris Baumann MD [facility]   09/17/2021, 5:54 AM

## 2021-09-17 NOTE — ED Provider Notes (Signed)
[facility] Emergency Department Provider Note  ____________________________________________   Event Date/Time   First MD Initiated Contact with Patient 09/17/21 [phone]     (approximate)  I have reviewed the triage vital signs and the nursing notes.   HISTORY  Chief Complaint Altered Mental Status, Fever, and Code Sepsis  Level 5 caveat:  history/ROS limited by acute/critical illness  HPI Brian Dodson is a 65 y.o. male with medical history as listed below which includes but is not limited to A. fib on Eliquis as well as mention on his medical history of vascular dementia.  He lives at an assisted living facility.  He is brought by EMS for evaluation of fever and altered mental status.  He was found to have a fever of 103 en route and was made code sepsis by EMS.  Little history is known except that he normally is alert, ambulatory, and talkative, and now he is shivering and minimally communicative although he is awake.  The patient will speak in short sentences and answer simple questions and follow simple commands, otherwise is not able to give a history.  He denies being in any pain but agrees that he does not feel well.  He says he is not short of breath.  According to the history provided to EMS, he is incontinent of urine at baseline.     Past Medical History:  Diagnosis Date   Asthma    Atrial fibrillation (HCC)    Benign prostatic hypertrophy with lower urinary tract symptoms (LUTS)    Bulbous urethral stricture    CAD (coronary artery disease)    s/p coronary stent 2003   Constipation    COPD (chronic obstructive pulmonary disease) (HCC)    Diabetes mellitus without complication (HCC)    Dizziness    Dyspnea    GERD (gastroesophageal reflux disease)    Gross hematuria    Hyperlipemia    Hypertension    Lumbago    Palpitation    Schizophrenia (HCC)     Patient Active Problem List   Diagnosis Date Noted   AKI (acute kidney injury)  (HCC) 09/17/2021   Cellulitis 08/15/2021   Sepsis secondary to UTI (HCC) 03/05/2021   Acute metabolic encephalopathy 03/05/2021   Dementia (HCC) 03/05/2021   Memory loss due to medical condition 03/05/2021   AF (paroxysmal atrial fibrillation) (HCC) 03/05/2021   Atrial flutter (HCC) 03/05/2021   Diabetes mellitus type 2, controlled, without complications (HCC) 03/05/2021   Obese 03/05/2021   Anemia of chronic disease 03/04/2021   Acute lower UTI 03/02/2021   Acute febrile illness    Bilateral lower extremity edema 04/01/2018   Lower extremity pain, bilateral 04/01/2018   Swelling of limb 10/04/2017   Sepsis (HCC) 05/20/2017   Anemia 05/07/2017   B12 deficiency 05/07/2017   Loss of memory 05/07/2017   Atrial fibrillation with RVR (HCC) 05/23/2015   Syncope 05/23/2015   Schizophrenia (HCC) 05/23/2015   Diabetes mellitus, type II (HCC) 05/23/2015   HTN (hypertension) 05/23/2015   GERD (gastroesophageal reflux disease) 05/23/2015   CAD (coronary artery disease) 05/23/2015    Past Surgical History:  Procedure Laterality Date   COLONOSCOPY     CORONARY ANGIOPLASTY WITH STENT PLACEMENT  11/2003   kidney stent      Prior to Admission medications   Medication Sig Start Date End Date Taking? Authorizing Provider  acetaminophen (TYLENOL) 500 MG tablet Take 1,000 mg by mouth every 6 (six) hours as needed for mild pain. Patient  not taking: Reported on 07/22/2021    [provider]  amiodarone (PACERONE) 200 MG tablet Take 1 tablet (200 mg total) by mouth daily. 07/26/21   Wyvonnia Dusky, MD  aspirin 81 MG tablet Take 81 mg by mouth daily.     [provider]  atorvastatin (LIPITOR) 10 MG tablet Take 10 mg by mouth daily. 05/18/15   [provider]  benztropine (COGENTIN) 1 MG tablet Take 1 mg by mouth at bedtime.     [provider]  brimonidine-timolol (COMBIGAN) 0.2-0.5 % ophthalmic solution Place 1 drop into both eyes every 12 (twelve) hours. Wait  3 to 5 minutes between drops    [provider]  carvedilol (COREG) 6.25 MG tablet Take 6.25 mg by mouth 2 (two) times daily with a meal. 07/05/21   [provider]  Cholecalciferol (EQL VITAMIN D3) 50 MCG (2000 UT) CAPS Take 1 capsule by mouth daily.    [provider]  citalopram (CELEXA) 20 MG tablet Take 20 mg by mouth daily.    [provider]  diclofenac Sodium (VOLTAREN) 1 % GEL Apply 2 g topically 4 (four) times daily as needed. Apply to neck and shoulders 08/18/21   Jennye Boroughs, MD  divalproex (DEPAKOTE ER) 500 MG 24 hr tablet Take 2,000 mg by mouth at bedtime.     [provider]  donepezil (ARICEPT) 10 MG tablet Take 10 mg by mouth at bedtime.     [provider]  ELIQUIS 5 MG TABS tablet Take 5 mg by mouth 2 (two) times daily. 05/18/15   [provider]  ferrous sulfate 325 (65 FE) MG tablet Take 325 mg by mouth daily with breakfast.    [provider]  furosemide (LASIX) 20 MG tablet Take 20 mg by mouth daily as needed. For edema (swelling) 06/22/21   [provider]  lanolin/mineral oil (KERI/THERA-DERM) LOTN Apply 1 application topically as needed for dry skin. Apply to face after washing face daily    [provider]  losartan (COZAAR) 25 MG tablet Take 1 tablet (25 mg total) by mouth daily. May increase to 2 tablets by mouth daily if blood pressure goes above 140/90 for more than a day  or if the blood pressure goes above 150/100 at all. Patient taking differently: Take 25 mg by mouth daily. 11/18/18 05/03/22  Nena Polio, MD  LUMIGAN 0.01 % SOLN Place 1 drop into both eyes at bedtime. 07/06/21   [provider]  melatonin 5 MG TABS Take 5 mg by mouth at bedtime.    [provider]  Menthol, Topical Analgesic, (BIOFREEZE) 4 % GEL Apply 1 application topically 3 (three) times daily. To lower back area as needed for pain    [provider]  metFORMIN (GLUCOPHAGE) 500 MG  tablet Take 500 mg by mouth 2 (two) times daily with a meal.    [provider]  omeprazole (PRILOSEC) 20 MG capsule Take 20 mg by mouth 2 (two) times daily before a meal. May open and sprinkle in applesauce.    [provider]  Loma [place] (OYSTER CALCIUM) 500 MG TABS tablet Take 500 mg of elemental calcium by mouth 2 (two) times daily.    [provider]  risperidone (RISPERDAL) 4 MG tablet Take 4 mg by mouth at bedtime. 05/18/15   [provider]  vitamin C (ASCORBIC ACID) 250 MG tablet Take 1 tablet (250 mg total) by mouth daily. 03/05/21   Allie Bossier,  MD    Allergies Sulfa antibiotics and Xarelto [rivaroxaban]  Family History  Problem Relation Age of Onset   Diabetes Other    Bladder Cancer Neg Hx    Kidney cancer Neg Hx    Prostate cancer Neg Hx     Social History Social History   Tobacco Use   Smoking status: Former   Smokeless tobacco: Never  Substance Use Topics   Alcohol use: No   Drug use: No    Review of Systems Level 5 caveat:  history/ROS limited by acute/critical illness   ____________________________________________   PHYSICAL EXAM:  ED Triage Vitals  Enc Vitals Group     BP 09/17/21 0145 125/65     Pulse Rate 09/17/21 0145 63     Resp 09/17/21 0145 (!) 29     Temp 09/17/21 0145 (!) 103.3 F (39.6 C)     Temp Source 09/17/21 0145 Oral     SpO2 09/17/21 0145 98 %     Weight 09/17/21 0223 102.8 kg (226 lb 9.6 oz)     Height --      Head Circumference --      Peak Flow --      Pain Score --      Pain Loc --      Pain Edu? --      Excl. in [place]? --      Constitutional: Awake and alert, oriented to self only.  Appears ill but nontoxic.  Having rigors upon arrival. Eyes: Conjunctivae are normal.  Head: Atraumatic. Nose: No congestion/rhinnorhea. Mouth/Throat: Patient is wearing a mask. Neck: No stridor.  No meningeal signs.   Cardiovascular: Normal rate, regular rhythm. Good peripheral  circulation. Respiratory: Normal respiratory effort.  No retractions. Gastrointestinal: Soft and nontender. No distention.  Musculoskeletal: No lower extremity tenderness nor edema. No gross deformities of extremities. Neurologic:  Normal speech and language. No gross focal neurologic deficits are appreciated.  Skin:  Skin is warm, dry.  Patient has several patchy areas that appear most consistent with excoriations.  1 of these is present on his lower abdomen as well as in patchy areas on his extremities.  Does not appear to be eczematous.  Does not appear to be infectious like cellulitis.   ____________________________________________   LABS (all labs ordered are listed, but only abnormal results are displayed)  Labs Reviewed  COMPREHENSIVE METABOLIC PANEL - Abnormal; Notable for the following components:      Result Value   Glucose, Bld 108 (*)    BUN 25 (*)    Creatinine, Ser 1.50 (*)    Albumin 3.4 (*)    GFR, Estimated 51 (*)    All other components within normal limits  CBC WITH DIFFERENTIAL/PLATELET - Abnormal; Notable for the following components:   RBC 2.99 (*)    Hemoglobin 10.3 (*)    HCT 30.6 (*)    MCV 102.3 (*)    MCH 34.4 (*)    Platelets 121 (*)    Neutro Abs 8.6 (*)    Abs Immature Granulocytes 0.10 (*)    All other components within normal limits  URINALYSIS, COMPLETE (UACMP) WITH MICROSCOPIC - Abnormal; Notable for the following components:   Color, Urine YELLOW (*)    APPearance CLEAR (*)    Ketones, ur 5 (*)    Protein, ur 30 (*)    All other components within normal limits  RESP PANEL BY RT-PCR (FLU A&B, COVID) ARPGX2  CULTURE, BLOOD (ROUTINE X 2)  CULTURE, BLOOD (ROUTINE  X 2)  URINE CULTURE  PROCALCITONIN  LACTIC ACID, PLASMA  PROTIME-INR  APTT  LIPASE, BLOOD  LACTIC ACID, PLASMA  BLOOD GAS, VENOUS  TROPONIN I (HIGH SENSITIVITY)   ____________________________________________  EKG  ED ECG REPORT I, Hinda Kehr, the attending physician,  personally viewed and interpreted this ECG.  Date: 09/17/2021 EKG Time: 1:43 AM Rate: 65 Rhythm: normal sinus rhythm QRS Axis: normal Intervals: Prolonged QTC of 564 ms ST/T Wave abnormalities: normal Narrative Interpretation: no evidence of acute ischemia  ____________________________________________  RADIOLOGY I, Hinda Kehr, personally viewed and evaluated these images (plain radiographs) as part of my medical decision making, as well as reviewing the written report by the radiologist.  ED MD interpretation: No acute abnormality on chest x-ray  Official radiology report(s): CT Head Wo Contrast  Result Date: 09/17/2021 CLINICAL DATA:  Mental status change with unknown cause EXAM: CT HEAD WITHOUT CONTRAST TECHNIQUE: Contiguous axial images were obtained from the base of the skull through the vertex without intravenous contrast. COMPARISON:  03/03/2021 FINDINGS: Brain: No evidence of acute infarction, hemorrhage, hydrocephalus, extra-axial collection or mass lesion/mass effect. Chronic small vessel ischemia in the hemispheric white matter Vascular: No hyperdense vessel or unexpected calcification. Skull: Normal. Negative for fracture or focal lesion. Sinuses/Orbits: No acute finding. IMPRESSION: 1. No acute or interval finding. 2. Mild chronic small vessel ischemia. Electronically Signed   By: Jorje Guild M.D.   On: 09/17/2021 04:07   CT CHEST ABDOMEN PELVIS W CONTRAST  Result Date: 09/17/2021 CLINICAL DATA:  Fever, altered mental status, abdominal distension EXAM: CT CHEST, ABDOMEN, AND PELVIS WITH CONTRAST TECHNIQUE: Multidetector CT imaging of the chest, abdomen and pelvis was performed following the standard protocol during bolus administration of intravenous contrast. CONTRAST:  25mL OMNIPAQUE IOHEXOL 300 MG/ML  SOLN COMPARISON:  CT abdomen/pelvis dated 07/22/2021 FINDINGS: Note: The patient was injected via a left hand IV. 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 As result, the normal contrast timing is significantly delayed, and the study is essentially unenhanced. The delayed study does demonstrate excretory contrast within the bilateral renal collecting systems. CT CHEST FINDINGS Cardiovascular: Mild cardiomegaly.  No pericardial effusion. No evidence of thoracic aortic aneurysm. Mild atherosclerotic calcifications of the aortic arch. Three vessel coronary atherosclerosis. Mediastinum/Nodes: No suspicious mediastinal lymphadenopathy. Visualized thyroid is unremarkable. Lungs/Pleura: Mild dependent atelectasis in the bilateral lower lobes. Evaluation of the lung parenchyma is constrained by respiratory motion. Within that constraint, there are no suspicious pulmonary nodules. No focal consolidation. No pleural effusion or pneumothorax. Musculoskeletal: Degenerative changes of the lower thoracic spine. CT ABDOMEN PELVIS FINDINGS Hepatobiliary: Liver is within normal limits. Gallbladder is unremarkable. No intrahepatic or extrahepatic ductal dilatation. Pancreas: Within normal limits. Spleen: Within normal limits. Adrenals/Urinary Tract: Adrenal glands are within normal limits. Kidneys are within normal limits.  No hydronephrosis. Thick-walled bladder, although underdistended. Stomach/Bowel: Stomach is within normal limits. No evidence of bowel obstruction. Normal appendix (series 3/image 93). Vascular/Lymphatic: No evidence of abdominal aortic aneurysm. Atherosclerotic calcifications of the abdominal aorta and branch vessels. No suspicious abdominopelvic lymphadenopathy. Reproductive: Prostate is unremarkable. Other: No abdominopelvic ascites. Musculoskeletal: Degenerative changes of the lumbar spine. IMPRESSION: No acute findings in the chest, abdomen, or pelvis. 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 Future enhanced CTs should be done via right arm IV placement. Electronically Signed  By: Julian Hy M.D.   On: 09/17/2021 03:57   DG Chest Port 1 View  Result Date: 09/17/2021 CLINICAL DATA:  Back pain EXAM: PORTABLE CHEST 1 VIEW COMPARISON:  08/15/2021 FINDINGS: Lungs are clear.  No pleural effusion or pneumothorax. The heart is normal in size. IMPRESSION: No evidence of acute cardiopulmonary disease. Electronically Signed   By: Julian Hy M.D.   On: 09/17/2021 02:17    ____________________________________________   PROCEDURES   Procedure(s) performed (including Critical Care):  .1-3 Lead EKG Interpretation Performed by: Hinda Kehr, MD Authorized by: Hinda Kehr, MD     Interpretation: normal     ECG rate:  65   ECG rate assessment: normal     Rhythm: sinus rhythm     Ectopy: none     Conduction: normal   .Critical Care Performed by: Hinda Kehr, MD Authorized by: Hinda Kehr, MD   Critical care provider statement:    Critical care time (minutes):  45   Critical care time was exclusive of:  Separately billable procedures and treating other patients   Critical care was necessary to treat or prevent imminent or life-threatening deterioration of the following conditions:  Sepsis   Critical care was time spent personally by me on the following activities:  Development of treatment plan with patient or surrogate, evaluation of patient's response to treatment, examination of patient, obtaining history from patient or surrogate, ordering and performing treatments and interventions, ordering and review of laboratory studies, ordering and review of radiographic studies, pulse oximetry, re-evaluation of patient's condition and review of old charts   ____________________________________________   INITIAL IMPRESSION / MDM / [place] / ED COURSE  As part of my medical decision making, I reviewed the following  data within the [place] notes reviewed and incorporated, Labs reviewed , EKG interpreted , Old chart reviewed, Radiograph reviewed , Discussed with admitting physician , and Notes from prior ED visits   Differential diagnosis includes, but is not limited to, sepsis, UTI, pneumonia, bacteremia, less likely meningitis.  Acute intra-abdominal infection is possible but the patient has no tenderness to palpation of the abdomen.  He denies any pain but is obviously ill-appearing and having rigors and a fever of 103.  He has some mild tachypnea but this is likely from the fever.  Full sepsis work-up is pending.  I requested in and out catheterization and ordered ceftriaxone 2 g IV after the cultures and urine have been obtained.  Acetaminophen 1000 mg by mouth.  1 L LR bolus while initial lactic acid is pending.  Patient has a normal blood pressure at this time.  The patient is on the cardiac monitor to evaluate for evidence of arrhythmia and/or significant heart rate changes.     Clinical Course as of 09/17/21 [id number]  Nancy Fetter Sep 17, 2021  0250 DG Chest [place] 1 View I personally reviewed the patient's imaging and agree with the radiologist's interpretation that there are no acute abnormalities on chest x-ray. [CF]  0255 Lactic Acid, Venous: 1.7 [CF]  [id number] Patient's CBC is essentially normal with no leukocytosis.  Urinalysis is unremarkable with no clear evidence of infection.  No source yet.  Obtaining CT head without contrast and CT chest/abdomen/pelvis.  He has some tachypnea as well as some mild abdominal distention and I have no other source at this time.  Must evaluate further to identify possible sources of sepsis and altered mental status.  Additional work-up is still pending [CF]  0304  Troponin I (High Sensitivity): 11 [CF]  0317 Resp Panel by RT-PCR (Flu A&B, Covid) Nasopharyngeal Swab [CF]  0526 Procalcitonin is elevated at 0.25 but not above 0.5 [CF]  0549 Patient still  with altered mental status and fever to 103.  CT head negative.  CT chest/abdomen/pelvis does not show any clear indication of the source of infection.  I reassessed the patient.  He is more communicative now than he was earlier.  However he is still not following commands very well.  Unclear his baseline but this seems to be worse than his baseline.  He is flexing and extending his neck which is reassuring and he is not indicating any pain.  He is, however, a poor candidate for an ED LP, not only for the acute encephalopathy but because of the fact he is anticoagulated on Eliquis.   [CF]  0550 2 g ceftriaxone IV.  I have broadened his coverage to include vancomycin and ampicillin 2 g IV pe has already for possible meningitis.  It would be safest for the patient to treat empirically and monitor his mental status as well as ultrasound guided LP as deemed appropriate by hospitalist service. [CF]  [id number] I consulted the hospitalist for admission and Dr. Damita Dunnings has put in admission orders. [CF]    Clinical Course User Index [CF] Hinda Kehr, MD     ____________________________________________  FINAL CLINICAL IMPRESSION(S) / ED DIAGNOSES  Final diagnoses:  Sepsis ([place])  Acute encephalopathy  Fever, unspecified fever cause  Acute kidney injury ([place])     MEDICATIONS GIVEN DURING THIS VISIT:  Medications  cefTRIAXone (ROCEPHIN) 2 g in sodium chloride 0.9 % 100 mL IVPB (0 g Intravenous Stopped 09/17/21 0244)  ampicillin (OMNIPEN) 2 g in sodium chloride 0.9 % 100 mL IVPB (has no administration in time range)  vancomycin (VANCOREADY) IVPB 2000 mg/400 mL (has no administration in time range)    Followed by  vancomycin (VANCOREADY) IVPB 500 mg/100 mL (has no administration in time range)  lactated ringers bolus 1,000 mL (has no administration in time range)  lactated ringers bolus 1,000 mL (0 mLs Intravenous Stopped 09/17/21 0312)  acetaminophen (TYLENOL) tablet 1,000 mg (1,000 mg Oral Given  09/17/21 0522)  iohexol (OMNIPAQUE) 300 MG/ML solution 80 mL (80 mLs Intravenous Contrast Given 09/17/21 0335)     ED Discharge Orders     None        Note:  This document was prepared using Dragon voice recognition software and may include unintentional dictation errors.   Hinda Kehr, MD 09/17/21 [phone]

## 2021-09-17 NOTE — Progress Notes (Signed)
PHARMACY - PHYSICIAN COMMUNICATION CRITICAL VALUE ALERT - BLOOD CULTURE IDENTIFICATION (BCID)  Brian Dodson is an 65 y.o. male who presented to [facility] on [facility]/17/2021 with a chief complaint of altered mental status  Assessment:  4/4 GPC, Strep Species  Name of physician (or Provider) ContactedMyriam Forehand  Current antibiotics: Rocephin, Ampicillin, Vancomycin  Changes to prescribed antibiotics recommended:  Patient is on recommended antibiotics - No changes needed  Results for orders placed or performed during the hospital encounter of 09/17/21  Blood Culture ID Panel (Reflexed) (Collected: 09/17/2021  2:00 AM)  Result Value Ref Range   Enterococcus faecalis NOT DETECTED NOT DETECTED   Enterococcus Faecium NOT DETECTED NOT DETECTED   Listeria monocytogenes NOT DETECTED NOT DETECTED   Staphylococcus species NOT DETECTED NOT DETECTED   Staphylococcus aureus (BCID) NOT DETECTED NOT DETECTED   Staphylococcus epidermidis NOT DETECTED NOT DETECTED   Staphylococcus lugdunensis NOT DETECTED NOT DETECTED   Streptococcus species DETECTED (A) NOT DETECTED   Streptococcus agalactiae NOT DETECTED NOT DETECTED   Streptococcus pneumoniae NOT DETECTED NOT DETECTED   Streptococcus pyogenes NOT DETECTED NOT DETECTED   A.calcoaceticus-baumannii NOT DETECTED NOT DETECTED   Bacteroides fragilis NOT DETECTED NOT DETECTED   Enterobacterales NOT DETECTED NOT DETECTED   Enterobacter cloacae complex NOT DETECTED NOT DETECTED   Escherichia coli NOT DETECTED NOT DETECTED   Klebsiella aerogenes NOT DETECTED NOT DETECTED   Klebsiella oxytoca NOT DETECTED NOT DETECTED   Klebsiella pneumoniae NOT DETECTED NOT DETECTED   Proteus species NOT DETECTED NOT DETECTED   Salmonella species NOT DETECTED NOT DETECTED   Serratia marcescens NOT DETECTED NOT DETECTED   Haemophilus influenzae NOT DETECTED NOT DETECTED   Neisseria meningitidis NOT DETECTED NOT DETECTED   Pseudomonas aeruginosa NOT DETECTED NOT DETECTED    Stenotrophomonas maltophilia NOT DETECTED NOT DETECTED   Candida albicans NOT DETECTED NOT DETECTED   Candida auris NOT DETECTED NOT DETECTED   Candida glabrata NOT DETECTED NOT DETECTED   Candida krusei NOT DETECTED NOT DETECTED   Candida parapsilosis NOT DETECTED NOT DETECTED   Candida tropicalis NOT DETECTED NOT DETECTED   Cryptococcus neoformans/gattii NOT DETECTED NOT DETECTED    Achsah Mcquade A Emlyn Maves 09/17/2021  3:07 PM

## 2021-09-17 NOTE — Progress Notes (Signed)
PHARMACY -  BRIEF ANTIBIOTIC NOTE   Pharmacy has received consult(s) for Vancomycin from an ED provider.  The patient's profile has been reviewed for ht/wt/allergies/indication/available labs.    One time order(s) placed for Vancomycin 2500 mg IV X 1  Further antibiotics/pharmacy consults should be ordered by admitting physician if indicated.                       Thank you, Brian Dodson D 09/17/2021  5:19 AM

## 2021-09-17 NOTE — ED Notes (Signed)
Pt awake and eating lunch.

## 2021-09-17 NOTE — Sepsis Progress Note (Signed)
Elink following Code Sepsis. 

## 2021-09-17 NOTE — Progress Notes (Signed)
CODE SEPSIS - PHARMACY COMMUNICATION  **[facility] Antibiotics should be administered within 1 hour of Sepsis diagnosis**  Time Code Sepsis Called/Page Received: 10/30 @ 0137   Antibiotics Ordered: Ceftriaxone 2 gm IV X 1   Time of 1st antibiotic administration: 10/30 @ 0156   Additional action taken by pharmacy:   If necessary, Name of Provider/Nurse Contacted:     Maksym Pfiffner D ,PharmD Clinical Pharmacist  09/17/2021  2:26 AM

## 2021-09-18 ENCOUNTER — Inpatient Hospital Stay (HOSPITAL_COMMUNITY)
Admit: 2021-09-18 | Discharge: 2021-09-18 | Disposition: A | Discharge status: Home / Self Care | Payer: Medicare Other | Attending: Internal Medicine | Admitting: Internal Medicine

## 2021-09-18 ENCOUNTER — Inpatient Hospital Stay: Payer: Medicare Other

## 2021-09-18 ENCOUNTER — Inpatient Hospital Stay: Admit: 2021-09-18 | Payer: Medicare Other

## 2021-09-18 DIAGNOSIS — N179 Acute kidney failure, unspecified: Secondary | ICD-10-CM | POA: Diagnosis not present

## 2021-09-18 DIAGNOSIS — R7881 Bacteremia: Secondary | ICD-10-CM | POA: Diagnosis not present

## 2021-09-18 DIAGNOSIS — B955 Unspecified streptococcus as the cause of diseases classified elsewhere: Secondary | ICD-10-CM | POA: Diagnosis not present

## 2021-09-18 DIAGNOSIS — I480 Paroxysmal atrial fibrillation: Secondary | ICD-10-CM | POA: Diagnosis not present

## 2021-09-18 DIAGNOSIS — G9341 Metabolic encephalopathy: Secondary | ICD-10-CM | POA: Diagnosis not present

## 2021-09-18 DIAGNOSIS — A409 Streptococcal sepsis, unspecified: Secondary | ICD-10-CM | POA: Diagnosis not present

## 2021-09-18 LAB — BLOOD GAS, VENOUS
Acid-Base Excess: 5.6 mmol/L — ABNORMAL HIGH (ref 0.0–2.0)
Bicarbonate: 29.8 mmol/L — ABNORMAL HIGH (ref 20.0–28.0)
O2 Saturation: 99.6 %
Patient temperature: 37
pCO2, Ven: 41 mmHg — ABNORMAL LOW (ref 44.0–60.0)
pH, Ven: 7.47 — ABNORMAL HIGH (ref 7.250–7.430)
pO2, Ven: 178 mmHg — ABNORMAL HIGH (ref 32.0–45.0)

## 2021-09-18 LAB — URINE CULTURE: Culture: NO GROWTH

## 2021-09-18 LAB — CBC WITH DIFFERENTIAL/PLATELET
Abs Immature Granulocytes: 0.03 10*3/uL (ref 0.00–0.07)
Basophils Absolute: 0 10*3/uL (ref 0.0–0.1)
Basophils Relative: 0 %
Eosinophils Absolute: 0 10*3/uL (ref 0.0–0.5)
Eosinophils Relative: 0 %
HCT: 30.3 % — ABNORMAL LOW (ref 39.0–52.0)
Hemoglobin: 10.3 g/dL — ABNORMAL LOW (ref 13.0–17.0)
Immature Granulocytes: 0 %
Lymphocytes Relative: 31 %
Lymphs Abs: 2.6 10*3/uL (ref 0.7–4.0)
MCH: 34.3 pg — ABNORMAL HIGH (ref 26.0–34.0)
MCHC: 34 g/dL (ref 30.0–36.0)
MCV: 101 fL — ABNORMAL HIGH (ref 80.0–100.0)
Monocytes Absolute: 0.9 10*3/uL (ref 0.1–1.0)
Monocytes Relative: 10 %
Neutro Abs: 4.7 10*3/uL (ref 1.7–7.7)
Neutrophils Relative %: 59 %
Platelets: 104 10*3/uL — ABNORMAL LOW (ref 150–400)
RBC: 3 MIL/uL — ABNORMAL LOW (ref 4.22–5.81)
RDW: 14.4 % (ref 11.5–15.5)
Smear Review: NORMAL
WBC: 8.1 10*3/uL (ref 4.0–10.5)
nRBC: 0 % (ref 0.0–0.2)

## 2021-09-18 LAB — PROTIME-INR
INR: 1 (ref 0.8–1.2)
Prothrombin Time: 13.4 seconds (ref 11.4–15.2)

## 2021-09-18 LAB — ECHOCARDIOGRAM LIMITED
AR max vel: 3.55 cm2
AV Area VTI: 3.67 cm2
AV Area mean vel: 3.26 cm2
AV Mean grad: 2 mmHg
AV Peak grad: 3.7 mmHg
Ao pk vel: 0.96 m/s
Area-P 1/2: 3.81 cm2
MV VTI: 2.31 cm2
S' Lateral: 2.9 cm
Weight: 3625.6 oz

## 2021-09-18 LAB — CBG MONITORING, ED
Glucose-Capillary: 107 mg/dL — ABNORMAL HIGH (ref 70–99)
Glucose-Capillary: 103 mg/dL — ABNORMAL HIGH (ref 70–99)
Glucose-Capillary: 99 mg/dL (ref 70–99)

## 2021-09-18 LAB — GLUCOSE, CSF: Glucose, CSF: 64 mg/dL (ref 40–70)

## 2021-09-18 LAB — PROTEIN, CSF: Total  Protein, CSF: 54 mg/dL — ABNORMAL HIGH (ref 15–45)

## 2021-09-18 LAB — BASIC METABOLIC PANEL
Anion gap: 5 (ref 5–15)
BUN: 19 mg/dL (ref 8–23)
CO2: 28 mmol/L (ref 22–32)
Calcium: 8.4 mg/dL — ABNORMAL LOW (ref 8.9–10.3)
Chloride: 102 mmol/L (ref 98–111)
Creatinine, Ser: 1.06 mg/dL (ref 0.61–1.24)
GFR, Estimated: >60 mL/min (ref >60)
Glucose, Bld: 105 mg/dL — ABNORMAL HIGH (ref 70–99)
Potassium: 3.9 mmol/L (ref 3.5–5.1)
Sodium: 135 mmol/L (ref 135–145)

## 2021-09-18 LAB — GLUCOSE, CAPILLARY
Glucose-Capillary: 118 mg/dL — ABNORMAL HIGH (ref 70–99)
Glucose-Capillary: 223 mg/dL — ABNORMAL HIGH (ref 70–99)

## 2021-09-18 LAB — PROCALCITONIN: Procalcitonin: 0.54 ng/mL

## 2021-09-18 LAB — CORTISOL-AM, BLOOD: Cortisol - AM: 13.5 ug/dL (ref 6.7–22.6)

## 2021-09-18 MED ORDER — SODIUM CHLORIDE 0.9 % IV SOLN
2.0000 g | INTRAVENOUS | Status: DC
  Filled 2021-09-18: qty 20

## 2021-09-18 MED ORDER — VANCOMYCIN HCL 1250 MG/250ML IV SOLN
1250.0000 mg | Freq: Two times a day (BID) | INTRAVENOUS | Status: DC
  Administered 2021-09-18: 1250 mg via INTRAVENOUS
  Filled 2021-09-18 (×3): qty 250

## 2021-09-18 NOTE — ED Notes (Signed)
Pt to IR at this time

## 2021-09-18 NOTE — Progress Notes (Signed)
Pharmacy Antibiotic Note  Brian Dodson is a 65 y.o. male admitted on 09/17/2021 with streptococcal bacteremia / meningitis.  Pharmacy has been consulted for vancomycin and ampicillin dosing.   Plan:  1) adjust ampicillin to 2 grams IV every 4 hours   2) adjust vancomycin to 1250 mg IV every 12 hours (using Pk parameters) SCr used: 1.05 mg/dL Ke: 7.253 h-1, G6/4: 40.3 h Css (calculated): 32.0 / 16.4 mcg/mL   3) continue ceftriaxone 2 grams IV every 12 hours  Weight: 102.8 kg (226 lb 9.6 oz)  Temp (24hrs), Avg:98.6 F (37 C), Min:98.3 F (36.8 C), Max:98.9 F (37.2 C)  Recent Labs  Lab 09/17/21 0200 09/18/21 0936  WBC 10.3 8.1  CREATININE 1.50* 1.06  LATICACIDVEN 1.7  --      Estimated Creatinine Clearance: 86.4 mL/min (by C-G formula based on SCr of 1.06 mg/dL).    Allergies  Allergen Reactions   Sulfa Antibiotics Other (See Comments)    Unknown reaction   Xarelto [Rivaroxaban]     Antimicrobials this admission: 10/30 vancomycin  >>  10/30 ampicillin >>  10/30 ceftriaxone >>  Microbiology results: 10/30 BCx: 4/4 Grp G strep 10/30 UCx:  NG 10/30 SARS CoV-2: negative 10/30 influenza A/B: negative 10/31 LP: pending   Thank you for allowing pharmacy to be a part of this patient's care.  Lowella Bandy 09/18/2021 2:02 PM

## 2021-09-18 NOTE — Consult Note (Signed)
NAME: Brian Dodson  DOB: Mar 10, 1956  MRN: [id number]  Date/Time: 09/18/2021 7:07 PM  REQUESTING PROVIDER: Clois Comber Subjective:  REASON FOR CONSULT: strep bacteremia ?pt is a poor historian, chart reviewed Brian Dodson is a 65 y.o. with a history of paroxysmal Afib on eliquis, HTN, DM-2, early dementia, schizhophrenia, h/o leg cellulitis and bacteremia presented from assisted living facility on 08/18/21 with altered mental status and fever  9/3-07/26/21 for fever and altered mental status and had  streptococcus group G  with rt leg cellulitis was treated with IV ceftriaxone for 6 days and then discharge don PO amoxicillin for 7 more days  I had seen him that visit -2 d echo was okay and he was not considered a high risk for endocarditis and TEE not done. HE was readmitted 08/15/21-08/18/21 for left leg cellulitis with a owund on the left knee Treated with Iv ceftriaxone  Blood culture was negative  He presented to the ED by EMS for altered mental status and incontinence on 09/17/21. His baseline is he is confused but talkative , and yesterday as per NH staff he was not talking- EMS noted a temp of 103 In the ED temp 103.3, Pulse rate 63, RR 29 and sats 98% WBC 10.3, cr 1.50 Blood culture sent  CT head nonacute. CT chest abdomen and pelvis with contrast nonacute Blood culture sent and he was starte don IV vanco/amp and ceftriaxone for possible meningitis and admitted. Blood culture came back positive for Streptococcus Group G Csf essentially normal with 3 wbc and 54 protein and 64 blood sugar I am asked to see the patient for the bacteremia  Past Medical History:  Diagnosis Date   Asthma    Atrial fibrillation (HCC)    Benign prostatic hypertrophy with lower urinary tract symptoms (LUTS)    Bulbous urethral stricture    CAD (coronary artery disease)    s/p coronary stent 2003   Constipation    COPD (chronic obstructive pulmonary disease) ([place])    Diabetes mellitus without  complication (HCC)    Dizziness    Dyspnea    GERD (gastroesophageal reflux disease)    Gross hematuria    Hyperlipemia    Hypertension    Lumbago    Palpitation    Schizophrenia (HCC)     Past Surgical History:  Procedure Laterality Date   COLONOSCOPY     CORONARY ANGIOPLASTY WITH STENT PLACEMENT  11/2003   kidney stent      Social History   Socioeconomic History   Marital status: Single    Spouse name: Not on file   Number of children: Not on file   Years of education: Not on file   Highest education level: Not on file  Occupational History   Not on file  Tobacco Use   Smoking status: Former   Smokeless tobacco: Never  Substance and Sexual Activity   Alcohol use: No   Drug use: No   Sexual activity: Not on file  Other Topics Concern   Not on file  Social History Narrative   Not on file   Social Determinants of Health   Financial Resource Strain: Not on file  Food Insecurity: Not on file  Transportation Needs: Not on file  Physical Activity: Not on file  Stress: Not on file  Social Connections: Not on file  Intimate Partner Violence: Not on file    Family History  Problem Relation Age of Onset   Diabetes Other    Bladder Cancer Neg Hx  Kidney cancer Neg Hx    Prostate cancer Neg Hx    Allergies  Allergen Reactions   Sulfa Antibiotics Other (See Comments)    Unknown reaction   Xarelto [Rivaroxaban]    I? Current Facility-Administered Medications  Medication Dose Route Frequency Provider Last Rate Last Admin   acetaminophen (TYLENOL) tablet 650 mg  650 mg Oral Q6H PRN Athena Masse, MD   650 mg at 09/17/21 [id number]   Or   acetaminophen (TYLENOL) suppository 650 mg  650 mg Rectal Q6H PRN Athena Masse, MD       amiodarone (PACERONE) tablet 200 mg  200 mg Oral Daily Jennye Boroughs, MD   200 mg at 09/18/21 0919   ampicillin (OMNIPEN) 2 g in sodium chloride 0.9 % 100 mL IVPB  2 g Intravenous Q4H Jennye Boroughs, MD   Stopped at 09/18/21 1246   aspirin  EC tablet 81 mg  81 mg Oral Daily Jennye Boroughs, MD   81 mg at 09/18/21 0919   atorvastatin (LIPITOR) tablet 10 mg  10 mg Oral Daily Jennye Boroughs, MD   10 mg at 09/18/21 0919   benztropine (COGENTIN) tablet 1 mg  1 mg Oral QHS Jennye Boroughs, MD   1 mg at 09/17/21 2232   brimonidine (ALPHAGAN) 0.2 % ophthalmic solution 1 drop  1 drop Both Eyes BID Jennye Boroughs, MD   1 drop at 09/18/21 [id number]   And   timolol (TIMOPTIC) 0.5 % ophthalmic solution 1 drop  1 drop Both Eyes BID Jennye Boroughs, MD   1 drop at 09/18/21 0916   cefTRIAXone (ROCEPHIN) 2 g in sodium chloride 0.9 % 100 mL IVPB  2 g Intravenous Q12H Athena Masse, MD   Stopped at 09/18/21 1453   cholecalciferol (VITAMIN D) tablet 2,000 Units  2,000 Units Oral Daily Jennye Boroughs, MD   2,000 Units at 09/18/21 [id number]   citalopram (CELEXA) tablet 20 mg  20 mg Oral Daily Jennye Boroughs, MD   20 mg at 09/18/21 0920   divalproex (DEPAKOTE ER) 24 hr tablet 2,000 mg  2,000 mg Oral QHS Jennye Boroughs, MD   2,000 mg at 09/17/21 2231   donepezil (ARICEPT) tablet 10 mg  10 mg Oral QHS Jennye Boroughs, MD   10 mg at 09/17/21 2234   ferrous sulfate tablet 325 mg  325 mg Oral Q breakfast Jennye Boroughs, MD   325 mg at 09/18/21 0753   insulin aspart (novoLOG) injection 0-15 Units  0-15 Units Subcutaneous Q4H Athena Masse, MD       latanoprost (XALATAN) 0.005 % ophthalmic solution 1 drop  1 drop Both Eyes QHS Jennye Boroughs, MD   1 drop at 09/17/21 2241   melatonin tablet 5 mg  5 mg Oral QHS Jennye Boroughs, MD   5 mg at 09/17/21 2232   ondansetron (ZOFRAN) tablet 4 mg  4 mg Oral Q6H PRN Athena Masse, MD       Or   ondansetron [facility]) injection 4 mg  4 mg Intravenous Q6H PRN Athena Masse, MD       pantoprazole (PROTONIX) EC tablet 40 mg  40 mg Oral BID Jennye Boroughs, MD   40 mg at 09/18/21 0919   risperiDONE (RISPERDAL) tablet 4 mg  4 mg Oral QHS Jennye Boroughs, MD   4 mg at 09/17/21 2232   vancomycin (VANCOREADY) IVPB 1250 mg/250 mL  1,250 mg  Intravenous Q12H Dallie Piles, [facility]       Current Outpatient Medications  Medication Sig Dispense Refill   acetaminophen (TYLENOL) 500 MG tablet Take 1,000 mg by mouth every 6 (six) hours as needed for mild pain.     amiodarone (PACERONE) 200 MG tablet Take 1 tablet (200 mg total) by mouth daily.     aspirin 81 MG tablet Take 81 mg by mouth daily.      atorvastatin (LIPITOR) 10 MG tablet Take 10 mg by mouth daily.     benztropine (COGENTIN) 1 MG tablet Take 1 mg by mouth at bedtime.      brimonidine-timolol (COMBIGAN) 0.2-0.5 % ophthalmic solution Place 1 drop into both eyes every 12 (twelve) hours. Wait 3 to 5 minutes between drops     carvedilol (COREG) 6.25 MG tablet Take 6.25 mg by mouth 2 (two) times daily with a meal.     Cholecalciferol (EQL VITAMIN D3) 50 MCG (2000 UT) CAPS Take 1 capsule by mouth daily.     citalopram (CELEXA) 20 MG tablet Take 20 mg by mouth daily.     diclofenac Sodium (VOLTAREN) 1 % GEL Apply 2 g topically 4 (four) times daily as needed. Apply to neck and shoulders     divalproex (DEPAKOTE ER) 500 MG 24 hr tablet Take 2,000 mg by mouth at bedtime.      donepezil (ARICEPT) 10 MG tablet Take 10 mg by mouth at bedtime.      ELIQUIS 5 MG TABS tablet Take 5 mg by mouth 2 (two) times daily.     ferrous sulfate 325 (65 FE) MG tablet Take 325 mg by mouth daily with breakfast.     furosemide (LASIX) 20 MG tablet Take 20 mg by mouth daily as needed. For edema (swelling)     lanolin/mineral oil (KERI/THERA-DERM) LOTN Apply 1 application topically as needed for dry skin. Apply to face after washing face daily     losartan (COZAAR) 25 MG tablet Take 1 tablet (25 mg total) by mouth daily. May increase to 2 tablets by mouth daily if blood pressure goes above 140/90 for more than a day  or if the blood pressure goes above 150/100 at all. (Patient not taking: Reported on 09/18/2021) 30 tablet 11   LUMIGAN 0.01 % SOLN Place 1 drop into both eyes at bedtime.     melatonin 5 MG TABS  Take 5 mg by mouth at bedtime.     Menthol, Topical Analgesic, (BIOFREEZE) 4 % GEL Apply 1 application topically 3 (three) times daily. To lower back area as needed for pain     metFORMIN (GLUCOPHAGE) 500 MG tablet Take 500 mg by mouth 2 (two) times daily with a meal.     omeprazole (PRILOSEC) 20 MG capsule Take 20 mg by mouth 2 (two) times daily before a meal. May open and sprinkle in applesauce.     Oyster Shell (OYSTER CALCIUM) 500 MG TABS tablet Take 500 mg of elemental calcium by mouth 2 (two) times daily.     risperidone (RISPERDAL) 4 MG tablet Take 4 mg by mouth at bedtime.     vitamin C (ASCORBIC ACID) 250 MG tablet Take 1 tablet (250 mg total) by mouth daily. 30 tablet 0     Abtx:  Anti-infectives (From admission, onward)    Start     Dose/Rate Route Frequency Ordered Stop   09/18/21 1800  vancomycin (VANCOREADY) IVPB 1250 mg/250 mL        1,250 mg 166.7 mL/hr over 90 Minutes Intravenous Every 12 hours 09/18/21 1416     09/17/21 1900  vancomycin (VANCOREADY) IVPB 750 mg/150 mL  Status:  Discontinued        750 mg 150 mL/hr over 60 Minutes Intravenous Every 12 hours 09/17/21 0624 09/18/21 1416   09/17/21 1400  cefTRIAXone (ROCEPHIN) 2 g in sodium chloride 0.9 % 100 mL IVPB        2 g 200 mL/hr over 30 Minutes Intravenous Every 12 hours 09/17/21 0610 10/01/21 1359   09/17/21 0630  ampicillin (OMNIPEN) 2 g in sodium chloride 0.9 % 100 mL IVPB        2 g 300 mL/hr over 20 Minutes Intravenous Every 4 hours 09/17/21 0615     09/17/21 0615  vancomycin (VANCOCIN) IVPB 1000 mg/200 mL premix  Status:  Discontinued        1,000 mg 200 mL/hr over 60 Minutes Intravenous  Once 09/17/21 0610 09/17/21 0617   09/17/21 0615  ampicillin (OMNIPEN) 2 g in sodium chloride 0.9 % 100 mL IVPB  Status:  Discontinued        2 g 300 mL/hr over 20 Minutes Intravenous  Once 09/17/21 0610 09/17/21 0614   09/17/21 0530  vancomycin (VANCOREADY) IVPB 2000 mg/400 mL       See Hyperspace for full Linked Orders  Report.   2,000 mg 200 mL/hr over 120 Minutes Intravenous  Once 09/17/21 0518 09/17/21 0940   09/17/21 0530  vancomycin (VANCOREADY) IVPB 500 mg/100 mL       See Hyperspace for full Linked Orders Report.   500 mg 100 mL/hr over 60 Minutes Intravenous  Once 09/17/21 0518 09/17/21 0732   09/17/21 0515  vancomycin (VANCOCIN) IVPB 1000 mg/200 mL premix  Status:  Discontinued        1,000 mg 200 mL/hr over 60 Minutes Intravenous  Once 09/17/21 0513 09/17/21 0518   09/17/21 0515  ampicillin (OMNIPEN) 2 g in sodium chloride 0.9 % 100 mL IVPB  Status:  Discontinued        2 g 300 mL/hr over 20 Minutes Intravenous  Once 09/17/21 0513 09/17/21 0614   09/17/21 0145  cefTRIAXone (ROCEPHIN) 2 g in sodium chloride 0.9 % 100 mL IVPB  Status:  Discontinued        2 g 200 mL/hr over 30 Minutes Intravenous Every 24 hours 09/17/21 0137 09/17/21 0616       REVIEW OF SYSTEMS: unreliable - says No for all symptoms Const: negative fever, negative chills, negative weight loss Eyes: negative diplopia or visual changes, negative eye pain ENT: negative coryza, negative sore throat Resp: negative cough, hemoptysis, dyspnea Cards: negative for chest pain, palpitations, lower extremity edema GU: negative for frequency, dysuria and hematuria GI: Negative for abdominal pain, diarrhea, bleeding, constipation Skin: negative for rash and pruritus Heme: negative for easy bruising and gum/nose bleeding MS: does not walk much- but no pain Neurolo:negative for headaches, dizziness, vertigo,   Psych: negative for feelings of anxiety, depression  Endocrine: , diabetes Allergy/Immunology-as above : Objective:  VITALS:  BP 101/74   Pulse (!) 121   Temp 98.3 F (36.8 C) (Oral)   Resp 16   Wt 102.8 kg   SpO2 100%   BMI 29.90 kg/m  PHYSICAL EXAM:  General: Alert, cooperative, no distress, appears stated age. Oriented in self only, follows commands Head: Normocephalic, without obvious abnormality,  atraumatic. Eyes: Conjunctivae clear, anicteric sclerae. Pupils are equal ENT Nares normal. No drainage or sinus tenderness. Lips, mucosa, and tongue normal. No Thrush edentulous Neck: Supple, symmetrical, no adenopathy, thyroid: non tender no carotid bruit  and no JVD. Back: No CVA tenderness. Lungs: Clear to auscultation bilaterally. No Wheezing or Rhonchi. No rales. Heart: Regular rate and rhythm, no murmur, rub or gallop. Abdomen: Soft, non-tender,not distended. Bowel sounds normal. No masses Extremities: atraumatic, no cyanosis. No edema. No clubbing Skin: No rashes or lesions. Or bruising Lymph: Cervical, supraclavicular normal. Neurologic: Grossly non-focal Pertinent Labs Lab Results CBC    Component Value Date/Time   WBC 8.1 09/18/2021 0936   RBC 3.00 (L) 09/18/2021 0936   HGB 10.3 (L) 09/18/2021 0936   HGB 9.4 (L) 02/14/2015 1427   HCT 30.3 (L) 09/18/2021 0936   HCT 29.6 (L) 02/14/2015 1427   PLT 104 (L) 09/18/2021 0936   PLT 106 (L) 02/14/2015 1427   MCV 101.0 (H) 09/18/2021 0936   MCV 101 (H) 02/14/2015 1427   MCH 34.3 (H) 09/18/2021 0936   MCHC 34.0 09/18/2021 0936   RDW 14.4 09/18/2021 0936   RDW 15.3 (H) 02/14/2015 1427   LYMPHSABS 2.6 09/18/2021 0936   LYMPHSABS 2.8 02/14/2015 1427   MONOABS 0.9 09/18/2021 0936   MONOABS 0.5 02/14/2015 1427   EOSABS 0.0 09/18/2021 0936   EOSABS 0.1 02/14/2015 1427   BASOSABS 0.0 09/18/2021 0936   BASOSABS 0.0 02/14/2015 1427    CMP Latest Ref Rng & Units 09/18/2021 09/17/2021 08/16/2021  Glucose 70 - 99 mg/dL 105(H) 108(H) 97  BUN 8 - 23 mg/dL 19 25(H) 27(H)  Creatinine 0.61 - 1.24 mg/dL 1.06 1.50(H) 1.03  Sodium 135 - 145 mmol/L 135 136 140  Potassium 3.5 - 5.1 mmol/L 3.9 4.0 4.4  Chloride 98 - 111 mmol/L 102 101 103  CO2 22 - 32 mmol/L 28 26 29   Calcium 8.9 - 10.3 mg/dL 8.4(L) 8.9 9.0  Total Protein 6.5 - 8.1 g/dL - 7.1 -  Total Bilirubin 0.3 - 1.2 mg/dL - 0.7 -  Alkaline Phos 38 - 126 U/L - 44 -  AST 15 - 41 U/L  - 22 -  ALT 0 - 44 U/L - 15 -      Microbiology: Recent Results (from the past 240 hour(s))  Blood Culture (routine x 2)     Status: Abnormal (Preliminary result)   Collection Time: 09/17/21  2:00 AM   Specimen: BLOOD  Result Value Ref Range Status   Specimen Description   Final    BLOOD RAC Performed at [facility], [address], [place], [place] [address]    Special Requests   Final    BOTTLES DRAWN AEROBIC AND ANAEROBIC Blood Culture adequate volume Performed at [facility], [address] [place], [place] [address]    Culture  Setup Time   Final    GRAM POSITIVE COCCI IN BOTH AEROBIC AND ANAEROBIC BOTTLES CRITICAL RESULT CALLED TO, READ BACK BY AND VERIFIED WITH: WALID NAZARI 09/17/21 1426 AMK    Culture (A)  Final    STREPTOCOCCUS GROUP G SUSCEPTIBILITIES TO FOLLOW Performed at [facility], [address], [place], [place] [address]    Report Status PENDING  Incomplete  Blood Culture (routine x 2)     Status: Abnormal (Preliminary result)   Collection Time: 09/17/21  2:00 AM   Specimen: BLOOD  Result Value Ref Range Status   Specimen Description   Final    BLOOD RH Performed at [facility], [address], [place], [place] [address]    Special Requests   Final    BOTTLES DRAWN AEROBIC AND ANAEROBIC Blood Culture adequate volume Performed at [facility], [address] [place], [place] [address]    Culture  Setup Time   Final    GRAM POSITIVE COCCI IN BOTH AEROBIC AND ANAEROBIC BOTTLES CRITICAL RESULT CALLED TO, READ BACK BY AND VERIFIED WITH: WALID NAZARI 09/17/21 1426 AMK    Culture STREPTOCOCCUS GROUP G (A)  Final   Report Status PENDING  Incomplete  Urine Culture     Status: None   Collection Time: 09/17/21  2:00 AM   Specimen: In/Out Cath Urine  Result Value Ref Range Status   Specimen Description   Final    IN/OUT CATH URINE Performed at [facility], [address], [place],  [place] [address]    Special Requests   Final    NONE Performed at [facility], [address], [place], [place] [address]    Culture   Final    NO GROWTH Performed at [facility], [place] [address] [place], [place] [address]    Report Status 09/18/2021 FINAL  Final  Resp Panel by RT-PCR (Flu A&B, Covid) Nasopharyngeal Swab     Status: None   Collection Time: 09/17/21  2:00 AM   Specimen: Nasopharyngeal Swab; Nasopharyngeal(NP) swabs in vial transport medium  Result Value Ref Range Status   SARS Coronavirus 2 by RT PCR NEGATIVE NEGATIVE Final    Comment: (NOTE) SARS-CoV-2 target nucleic acids are NOT DETECTED.  The SARS-CoV-2 RNA is generally detectable in upper respiratory specimens during the acute phase of infection. The lowest concentration of SARS-CoV-2 viral copies this assay can detect is 138 copies/mL. A negative result does not preclude SARS-Cov-2 infection and should not be used as the sole basis for treatment or other patient management decisions. A negative result may occur with  improper specimen collection/handling, submission of specimen other than nasopharyngeal swab, presence of viral mutation(s) within the areas targeted by this assay, and inadequate number of viral copies(<138 copies/mL). A negative result must be combined with clinical observations, patient history, and epidemiological information. The expected result is Negative.  Fact Sheet for Patients:  EntrepreneurPulse.com.au  Fact Sheet for Healthcare Providers:  IncredibleEmployment.be  This test is no t yet approved or cleared by the [place] FDA and  has been authorized for detection and/or diagnosis of SARS-CoV-2 by FDA under an Emergency Use Authorization (EUA). This EUA will remain  in effect (meaning this test can be used) for the duration of the COVID-19 declaration under Section 564(b)(1) of the Act, 21 U.S.C.section 360bbb-3(b)(1), unless  the authorization is terminated  or revoked sooner.       Influenza A by PCR NEGATIVE NEGATIVE Final   Influenza B by PCR NEGATIVE NEGATIVE Final    Comment: (NOTE) The Xpert Xpress SARS-CoV-2/FLU/RSV plus assay is intended as an aid in the diagnosis of influenza from Nasopharyngeal swab specimens and should not be used as a sole basis for treatment. Nasal washings and aspirates are unacceptable for Xpert Xpress SARS-CoV-2/FLU/RSV testing.  Fact Sheet for Patients: EntrepreneurPulse.com.au  Fact Sheet for Healthcare Providers: IncredibleEmployment.be  This test is not yet approved or cleared by the [place] FDA and has been authorized for detection and/or diagnosis of SARS-CoV-2 by FDA under an Emergency Use Authorization (EUA). This EUA will remain in effect (meaning this test can be used) for the duration of the COVID-19 declaration under Section 564(b)(1) of the Act, 21 U.S.C. section 360bbb-3(b)(1), unless the authorization is terminated or revoked.  Performed at [facility], [address], [place], [place] [address]  Blood Culture ID Panel (Reflexed)     Status: Abnormal   Collection Time: 09/17/21  2:00 AM  Result Value Ref Range Status   Enterococcus faecalis NOT DETECTED NOT DETECTED Final   Enterococcus Faecium NOT DETECTED NOT DETECTED Final   Listeria monocytogenes NOT DETECTED NOT DETECTED Final   Staphylococcus species NOT DETECTED NOT DETECTED Final   Staphylococcus aureus (BCID) NOT DETECTED NOT DETECTED Final   Staphylococcus epidermidis NOT DETECTED NOT DETECTED Final   Staphylococcus lugdunensis NOT DETECTED NOT DETECTED Final   Streptococcus species DETECTED (A) NOT DETECTED Final    Comment: Not Enterococcus species, Streptococcus agalactiae, Streptococcus pyogenes, or Streptococcus pneumoniae. CRITICAL RESULT CALLED TO, READ BACK BY AND VERIFIED WITH: WALID NAZARI 09/17/21 1426 AMK    Streptococcus  agalactiae NOT DETECTED NOT DETECTED Final   Streptococcus pneumoniae NOT DETECTED NOT DETECTED Final   Streptococcus pyogenes NOT DETECTED NOT DETECTED Final   A.calcoaceticus-baumannii NOT DETECTED NOT DETECTED Final   Bacteroides fragilis NOT DETECTED NOT DETECTED Final   Enterobacterales NOT DETECTED NOT DETECTED Final   Enterobacter cloacae complex NOT DETECTED NOT DETECTED Final   Escherichia coli NOT DETECTED NOT DETECTED Final   Klebsiella aerogenes NOT DETECTED NOT DETECTED Final   Klebsiella oxytoca NOT DETECTED NOT DETECTED Final   Klebsiella pneumoniae NOT DETECTED NOT DETECTED Final   Proteus species NOT DETECTED NOT DETECTED Final   Salmonella species NOT DETECTED NOT DETECTED Final   Serratia marcescens NOT DETECTED NOT DETECTED Final   Haemophilus influenzae NOT DETECTED NOT DETECTED Final   Neisseria meningitidis NOT DETECTED NOT DETECTED Final   Pseudomonas aeruginosa NOT DETECTED NOT DETECTED Final   Stenotrophomonas maltophilia NOT DETECTED NOT DETECTED Final   Candida albicans NOT DETECTED NOT DETECTED Final   Candida auris NOT DETECTED NOT DETECTED Final   Candida glabrata NOT DETECTED NOT DETECTED Final   Candida krusei NOT DETECTED NOT DETECTED Final   Candida parapsilosis NOT DETECTED NOT DETECTED Final   Candida tropicalis NOT DETECTED NOT DETECTED Final   Cryptococcus neoformans/gattii NOT DETECTED NOT DETECTED Final    Comment: Performed at [facility], [address] [place], [place] [address]    IMAGING RESULTS: CT abdomen/chest N I have personally reviewed the films ? Impression/Recommendation ? 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

## 2021-09-18 NOTE — ED Notes (Signed)
Informed RN bed assigned 

## 2021-09-18 NOTE — Progress Notes (Addendum)
Progress Note    Brian Dodson  [id number] DOB: June 15, 1956  DOA: 09/17/2021 PCP: [name], MD      Brief Narrative:    Medical records reviewed and are as summarized below:  Brian Dodson is a 65 y.o. male with medical history significant for paroxysmal atrial fibrillation on Eliquis, hypertension, type II DM, CAD, cognitive impairment, schizophrenia, recent hospitalization from 07/22/2021 to 07/26/2021 for severe sepsis from group B strep bacteremia, recent discharge from the hospital on 08/18/2021 after hospitalization for left leg cellulitis.  He was brought to the hospital because of fever and altered mental status.  His temperature was 103.3 F in the emergency room.     Assessment/Plan:   Active Problems:   Diabetes mellitus, type II (HCC)   HTN (hypertension)   CAD (coronary artery disease)   Sepsis (HCC)   Anemia of chronic disease   Acute metabolic encephalopathy   AF (paroxysmal atrial fibrillation) (HCC)   AKI (acute kidney injury) ([facility])   Streptococcal bacteremia    Body mass index is 29.9 kg/m.   Severe streptococcal sepsis secondary to streptococcal bacteremia: Continue empiric IV antibiotics.  Plan for lumbar puncture today.  Obtain 2D echo to check for endocarditis.  Cardiology will be consulted for TEE if limited 2D echo does not show any vegetations.  Recent 2D echo on 07/23/2021 showed normal EF without evidence of endocarditis.  Consulted ID to assist with management.  AKI: Improved.  Discontinue IV fluids.  Acute metabolic encephalopathy: Improved  CAD, paroxysmal atrial fibrillation: Eliquis has been held for lumbar puncture.  Schizophrenia, cognitive impairment: Continue psychotropics  Other comorbidities include type II DM, hypertension, anemia of chronic disease, glaucoma    Diet Order             Diet NPO time specified  Diet effective midnight                      Consultants: Infectious  disease  Procedures: None    Medications:    amiodarone  200 mg Oral Daily   aspirin EC  81 mg Oral Daily   atorvastatin  10 mg Oral Daily   benztropine  1 mg Oral QHS   brimonidine  1 drop Both Eyes BID   And   timolol  1 drop Both Eyes BID   cholecalciferol  2,000 Units Oral Daily   citalopram  20 mg Oral Daily   divalproex  2,000 mg Oral QHS   donepezil  10 mg Oral QHS   ferrous sulfate  325 mg Oral Q breakfast   insulin aspart  0-15 Units Subcutaneous Q4H   latanoprost  1 drop Both Eyes QHS   melatonin  5 mg Oral QHS   pantoprazole  40 mg Oral BID   risperidone  4 mg Oral QHS   Continuous Infusions:  ampicillin (OMNIPEN) IV Stopped (09/18/21 0813)   cefTRIAXone (ROCEPHIN)  IV Stopped (09/18/21 0326)   lactated ringers 100 mL/hr at 09/18/21 0931   vancomycin Stopped (09/18/21 0852)     Anti-infectives (From admission, onward)    Start     Dose/Rate Route Frequency Ordered Stop   09/17/21 1900  vancomycin (VANCOREADY) IVPB 750 mg/150 mL        750 mg 150 mL/hr over 60 Minutes Intravenous Every 12 hours 09/17/21 0624     09/17/21 1400  cefTRIAXone (ROCEPHIN) 2 g in sodium chloride 0.9 % 100 mL IVPB  2 g 200 mL/hr over 30 Minutes Intravenous Every 12 hours 09/17/21 0610 10/01/21 1359   09/17/21 0630  ampicillin (OMNIPEN) 2 g in sodium chloride 0.9 % 100 mL IVPB        2 g 300 mL/hr over 20 Minutes Intravenous Every 4 hours 09/17/21 0615     09/17/21 0615  vancomycin (VANCOCIN) IVPB 1000 mg/200 mL premix  Status:  Discontinued        1,000 mg 200 mL/hr over 60 Minutes Intravenous  Once 09/17/21 0610 09/17/21 0617   09/17/21 0615  ampicillin (OMNIPEN) 2 g in sodium chloride 0.9 % 100 mL IVPB  Status:  Discontinued        2 g 300 mL/hr over 20 Minutes Intravenous  Once 09/17/21 0610 09/17/21 0614   09/17/21 0530  vancomycin (VANCOREADY) IVPB 2000 mg/400 mL       See Hyperspace for full Linked Orders Report.   2,000 mg 200 mL/hr over 120 Minutes Intravenous   Once 09/17/21 0518 09/17/21 0940   09/17/21 0530  vancomycin (VANCOREADY) IVPB 500 mg/100 mL       See Hyperspace for full Linked Orders Report.   500 mg 100 mL/hr over 60 Minutes Intravenous  Once 09/17/21 0518 09/17/21 0732   09/17/21 0515  vancomycin (VANCOCIN) IVPB 1000 mg/200 mL premix  Status:  Discontinued        1,000 mg 200 mL/hr over 60 Minutes Intravenous  Once 09/17/21 0513 09/17/21 0518   09/17/21 0515  ampicillin (OMNIPEN) 2 g in sodium chloride 0.9 % 100 mL IVPB  Status:  Discontinued        2 g 300 mL/hr over 20 Minutes Intravenous  Once 09/17/21 0513 09/17/21 0614   09/17/21 0145  cefTRIAXone (ROCEPHIN) 2 g in sodium chloride 0.9 % 100 mL IVPB  Status:  Discontinued        2 g 200 mL/hr over 30 Minutes Intravenous Every 24 hours 09/17/21 0137 09/17/21 [id number]              Family Communication/Anticipated D/C date and plan/Code Status   DVT prophylaxis: Place and maintain sequential compression device Start: 09/17/21 1239 SCDs Start: 09/17/21 0608     Code Status: Full Code  Family Communication: None Disposition Plan: Discharge to SNF when medically ready   Status is: Inpatient  Remains inpatient appropriate because: Bacteremia on IV antibiotics           Subjective:   Interval events noted.  He feels fine and he has no complaints.  No headache, confusion, neck pain, vomiting, diarrhea, cough, shortness of breath or chest pain.  Objective:    Vitals:   09/18/21 0600 09/18/21 0603 09/18/21 0900 09/18/21 0901  BP: 104/75  113/80   Pulse: 61  (!) 53 65  Resp: 18  14 14   Temp: 98.3 F (36.8 C)     TempSrc: Oral     SpO2: 100% 100% 96% 100%  Weight:       No data found.   Intake/Output Summary (Last 24 hours) at 09/18/2021 1100 Last data filed at 09/18/2021 0852 Gross per 24 hour  Intake 2503.39 ml  Output 1700 ml  Net 803.39 ml   Filed Weights   09/17/21 0223  Weight: 102.8 kg    Exam:   GEN: NAD SKIN: No rash EYES:  EOMI ENT: MMM CV: RRR PULM: CTA B ABD: soft, ND, NT, +BS CNS: AAO x 3, non focal EXT: No edema or tenderness  Data Reviewed:   I have personally reviewed following labs and imaging studies:  Labs: Labs show the following:   Basic Metabolic Panel: Recent Labs  Lab 09/17/21 0200 09/18/21 0936  NA 136 135  K 4.0 3.9  CL 101 102  CO2 26 28  GLUCOSE 108* 105*  BUN 25* 19  CREATININE 1.50* 1.06  CALCIUM 8.9 8.4*   GFR Estimated Creatinine Clearance: 86.4 mL/min (by C-G formula based on SCr of 1.06 mg/dL). Liver Function Tests: Recent Labs  Lab 09/17/21 0200  AST 22  ALT 15  ALKPHOS 44  BILITOT 0.7  PROT 7.1  ALBUMIN 3.4*   Recent Labs  Lab 09/17/21 0200  LIPASE 35   No results for input(s): AMMONIA in the last 168 hours. Coagulation profile Recent Labs  Lab 09/17/21 0200 09/18/21 0936  INR 1.2 1.0    CBC: Recent Labs  Lab 09/17/21 0200 09/18/21 0936  WBC 10.3 8.1  NEUTROABS 8.6* 4.7  HGB 10.3* 10.3*  HCT 30.6* 30.3*  MCV 102.3* 101.0*  PLT 121* 104*   Cardiac Enzymes: No results for input(s): CKTOTAL, CKMB, CKMBINDEX, TROPONINI in the last 168 hours. BNP (last 3 results) No results for input(s): PROBNP in the last 8760 hours. CBG: Recent Labs  Lab 09/17/21 1229 09/17/21 1807 09/17/21 2122 09/18/21 0554 09/18/21 0757  GLUCAP 78 95 121* 107* 103*   D-Dimer: No results for input(s): DDIMER in the last 72 hours. Hgb A1c: Recent Labs    09/17/21 0200  HGBA1C 6.0*   Lipid Profile: No results for input(s): CHOL, HDL, LDLCALC, TRIG, CHOLHDL, LDLDIRECT in the last 72 hours. Thyroid function studies: No results for input(s): TSH, T4TOTAL, T3FREE, THYROIDAB in the last 72 hours.  Invalid input(s): FREET3 Anemia work up: No results for input(s): VITAMINB12, FOLATE, FERRITIN, TIBC, IRON, RETICCTPCT in the last 72 hours. Sepsis Labs: Recent Labs  Lab 09/17/21 0200 09/18/21 0936  PROCALCITON 0.24  --   WBC 10.3 8.1   LATICACIDVEN 1.7  --     Microbiology Recent Results (from the past 240 hour(s))  Blood Culture (routine x 2)     Status: Abnormal (Preliminary result)   Collection Time: 09/17/21  2:00 AM   Specimen: BLOOD  Result Value Ref Range Status   Specimen Description   Final    BLOOD RAC Performed at [facility], [address] [place], [place] [address]    Special Requests   Final    BOTTLES DRAWN AEROBIC AND ANAEROBIC Blood Culture adequate volume Performed at [facility], [address] [place], [place] [address]    Culture  Setup Time   Final    GRAM POSITIVE COCCI IN BOTH AEROBIC AND ANAEROBIC BOTTLES CRITICAL RESULT CALLED TO, READ BACK BY AND VERIFIED WITH: WALID NAZARI 09/17/21 1426 AMK    Culture (A)  Final    STREPTOCOCCUS GROUP G SUSCEPTIBILITIES TO FOLLOW Performed at [facility], [place] [address] [place], [place] [address]    Report Status PENDING  Incomplete  Blood Culture (routine x 2)     Status: Abnormal (Preliminary result)   Collection Time: 09/17/21  2:00 AM   Specimen: BLOOD  Result Value Ref Range Status   Specimen Description   Final    BLOOD RH Performed at [facility], [address] [place], [place] [address]    Special Requests   Final    BOTTLES DRAWN AEROBIC AND ANAEROBIC Blood Culture adequate volume Performed at [facility], [address] [place], [place] [address]    Culture  Setup Time   Final    GRAM POSITIVE COCCI IN BOTH AEROBIC AND ANAEROBIC BOTTLES CRITICAL RESULT CALLED TO, READ BACK BY AND VERIFIED WITH: WALID NAZARI 09/17/21 1426 AMK    Culture STREPTOCOCCUS GROUP G (A)  Final   Report Status PENDING  Incomplete  Urine Culture     Status: None   Collection Time: 09/17/21  2:00 AM   Specimen: In/Out Cath Urine  Result Value Ref Range Status   Specimen Description   Final    IN/OUT CATH URINE Performed at [facility], [address] [place], [place] [address]     Special Requests   Final    NONE Performed at [facility], [address] [place], [place] [address]    Culture   Final    NO GROWTH Performed at [facility], [place] [address], [place], [place] [address]    Report Status 09/18/2021 FINAL  Final  Resp Panel by RT-PCR (Flu A&B, Covid) Nasopharyngeal Swab     Status: None   Collection Time: 09/17/21  2:00 AM   Specimen: Nasopharyngeal Swab; Nasopharyngeal(NP) swabs in vial transport medium  Result Value Ref Range Status   SARS Coronavirus 2 by RT PCR NEGATIVE NEGATIVE Final    Comment: (NOTE) SARS-CoV-2 target nucleic acids are NOT DETECTED.  The SARS-CoV-2 RNA is generally detectable in upper respiratory specimens during the acute phase of infection. The lowest concentration of SARS-CoV-2 viral copies this assay can detect is 138 copies/mL. A negative result does not preclude SARS-Cov-2 infection and should not be used as the sole basis for treatment or other patient management decisions. A negative result may occur with  improper specimen collection/handling, submission of specimen other than nasopharyngeal swab, presence of viral mutation(s) within the areas targeted by this assay, and inadequate number of viral copies(<138 copies/mL). A negative result must be combined with clinical observations, patient history, and epidemiological information. The expected result is Negative.  Fact Sheet for Patients:  EntrepreneurPulse.com.au  Fact Sheet for Healthcare Providers:  IncredibleEmployment.be  This test is no t yet approved or cleared by the [place] FDA and  has been authorized for detection and/or diagnosis of SARS-CoV-2 by FDA under an Emergency Use Authorization (EUA). This EUA will remain  in effect (meaning this test can be used) for the duration of the COVID-19 declaration under Section 564(b)(1) of the Act, 21 U.S.C.section 360bbb-3(b)(1), unless the  authorization is terminated  or revoked sooner.       Influenza A by PCR NEGATIVE NEGATIVE Final   Influenza B by PCR NEGATIVE NEGATIVE Final    Comment: (NOTE) The Xpert Xpress SARS-CoV-2/FLU/RSV plus assay is intended as an aid in the diagnosis of influenza from Nasopharyngeal swab specimens and should not be used as a sole basis for treatment. Nasal washings and aspirates are unacceptable for Xpert Xpress SARS-CoV-2/FLU/RSV testing.  Fact Sheet for Patients: EntrepreneurPulse.com.au  Fact Sheet for Healthcare Providers: IncredibleEmployment.be  This test is not yet approved or cleared by the [place] FDA and has been authorized for detection and/or diagnosis of SARS-CoV-2 by FDA under an Emergency Use Authorization (EUA). This EUA will remain in effect (meaning this test can be used) for the duration of the COVID-19 declaration under Section 564(b)(1) of the Act, 21 U.S.C. section 360bbb-3(b)(1), unless the authorization is terminated or revoked.  Performed at [facility], [address], [place],  [address]   Blood Culture ID  Panel (Reflexed)     Status: Abnormal   Collection Time: 09/17/21  2:00 AM  Result Value Ref Range Status   Enterococcus faecalis NOT DETECTED NOT DETECTED Final   Enterococcus Faecium NOT DETECTED NOT DETECTED Final   Listeria monocytogenes NOT DETECTED NOT DETECTED Final   Staphylococcus species NOT DETECTED NOT DETECTED Final   Staphylococcus aureus (BCID) NOT DETECTED NOT DETECTED Final   Staphylococcus epidermidis NOT DETECTED NOT DETECTED Final   Staphylococcus lugdunensis NOT DETECTED NOT DETECTED Final   Streptococcus species DETECTED (A) NOT DETECTED Final    Comment: Not Enterococcus species, Streptococcus agalactiae, Streptococcus pyogenes, or Streptococcus pneumoniae. CRITICAL RESULT CALLED TO, READ BACK BY AND VERIFIED WITH: WALID NAZARI 09/17/21 1426 AMK    Streptococcus  agalactiae NOT DETECTED NOT DETECTED Final   Streptococcus pneumoniae NOT DETECTED NOT DETECTED Final   Streptococcus pyogenes NOT DETECTED NOT DETECTED Final   A.calcoaceticus-baumannii NOT DETECTED NOT DETECTED Final   Bacteroides fragilis NOT DETECTED NOT DETECTED Final   Enterobacterales NOT DETECTED NOT DETECTED Final   Enterobacter cloacae complex NOT DETECTED NOT DETECTED Final   Escherichia coli NOT DETECTED NOT DETECTED Final   Klebsiella aerogenes NOT DETECTED NOT DETECTED Final   Klebsiella oxytoca NOT DETECTED NOT DETECTED Final   Klebsiella pneumoniae NOT DETECTED NOT DETECTED Final   Proteus species NOT DETECTED NOT DETECTED Final   Salmonella species NOT DETECTED NOT DETECTED Final   Serratia marcescens NOT DETECTED NOT DETECTED Final   Haemophilus influenzae NOT DETECTED NOT DETECTED Final   Neisseria meningitidis NOT DETECTED NOT DETECTED Final   Pseudomonas aeruginosa NOT DETECTED NOT DETECTED Final   Stenotrophomonas maltophilia NOT DETECTED NOT DETECTED Final   Candida albicans NOT DETECTED NOT DETECTED Final   Candida auris NOT DETECTED NOT DETECTED Final   Candida glabrata NOT DETECTED NOT DETECTED Final   Candida krusei NOT DETECTED NOT DETECTED Final   Candida parapsilosis NOT DETECTED NOT DETECTED Final   Candida tropicalis NOT DETECTED NOT DETECTED Final   Cryptococcus neoformans/gattii NOT DETECTED NOT DETECTED Final    Comment: Performed at [facility], [address] [place], [place] [address]    Procedures and diagnostic studies:  CT Head Wo Contrast  Result Date: 09/17/2021 CLINICAL DATA:  Mental status change with unknown cause EXAM: CT HEAD WITHOUT CONTRAST TECHNIQUE: Contiguous axial images were obtained from the base of the skull through the vertex without intravenous contrast. COMPARISON:  03/03/2021 FINDINGS: Brain: No evidence of acute infarction, hemorrhage, hydrocephalus, extra-axial collection or mass lesion/mass effect. Chronic  small vessel ischemia in the hemispheric white matter Vascular: No hyperdense vessel or unexpected calcification. Skull: Normal. Negative for fracture or focal lesion. Sinuses/Orbits: No acute finding. IMPRESSION: 1. No acute or interval finding. 2. Mild chronic small vessel ischemia. Electronically Signed   By: Tiburcio Pea M.D.   On: 09/17/2021 04:07   CT CHEST ABDOMEN PELVIS W CONTRAST  Result Date: 09/17/2021 CLINICAL DATA:  Fever, altered mental status, abdominal distension EXAM: CT CHEST, ABDOMEN, AND PELVIS WITH CONTRAST TECHNIQUE: Multidetector CT imaging of the chest, abdomen and pelvis was performed following the standard protocol during bolus administration of intravenous contrast. CONTRAST:  4mL OMNIPAQUE IOHEXOL 300 MG/ML  SOLN COMPARISON:  CT abdomen/pelvis dated 07/22/2021 FINDINGS: Note: The patient was injected via a left hand IV. 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 As result, the normal contrast timing is significantly delayed, and the study is essentially unenhanced.  The delayed study does demonstrate excretory contrast within the bilateral renal collecting systems. CT CHEST FINDINGS Cardiovascular: Mild cardiomegaly.  No pericardial effusion. No evidence of thoracic aortic aneurysm. Mild atherosclerotic calcifications of the aortic arch. Three vessel coronary atherosclerosis. Mediastinum/Nodes: No suspicious mediastinal lymphadenopathy. Visualized thyroid is unremarkable. Lungs/Pleura: Mild dependent atelectasis in the bilateral lower lobes. Evaluation of the lung parenchyma is constrained by respiratory motion. Within that constraint, there are no suspicious pulmonary nodules. No focal consolidation. No pleural effusion or pneumothorax. Musculoskeletal: Degenerative changes of the lower thoracic spine. CT ABDOMEN PELVIS FINDINGS Hepatobiliary: Liver is within normal limits.  Gallbladder is unremarkable. No intrahepatic or extrahepatic ductal dilatation. Pancreas: Within normal limits. Spleen: Within normal limits. Adrenals/Urinary Tract: Adrenal glands are within normal limits. Kidneys are within normal limits.  No hydronephrosis. Thick-walled bladder, although underdistended. Stomach/Bowel: Stomach is within normal limits. No evidence of bowel obstruction. Normal appendix (series 3/image 93). Vascular/Lymphatic: No evidence of abdominal aortic aneurysm. Atherosclerotic calcifications of the abdominal aorta and branch vessels. No suspicious abdominopelvic lymphadenopathy. Reproductive: Prostate is unremarkable. Other: No abdominopelvic ascites. Musculoskeletal: Degenerative changes of the lumbar spine. IMPRESSION: No acute findings in the chest, abdomen, or pelvis. 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 Future enhanced CTs should be done via right arm IV placement. Electronically Signed   By: Julian Hy M.D.   On: 09/17/2021 03:57   DG Chest Port 1 View  Result Date: 09/17/2021 CLINICAL DATA:  Back pain EXAM: PORTABLE CHEST 1 VIEW COMPARISON:  08/15/2021 FINDINGS: Lungs are clear.  No pleural effusion or pneumothorax. The heart is normal in size. IMPRESSION: No evidence of acute cardiopulmonary disease. Electronically Signed   By: Julian Hy M.D.   On: 09/17/2021 02:17               LOS: 1 day   Mishon Blubaugh  [facility]   Pager on [email or web address]. If 7PM-7AM, please contact night-coverage at [email or web address]     09/18/2021, 11:00 AM

## 2021-09-18 NOTE — Progress Notes (Signed)
*  PRELIMINARY RESULTS* Echocardiogram 2D Echocardiogram has been performed.  Cristela Blue 09/18/2021, 12:51 PM

## 2021-09-18 NOTE — Procedures (Signed)
PROCEDURE SUMMARY:  Successful fluoroscopic guided lumbar puncture.  Yielded 9 mL's of colorless CSF fluid.  No immediate complications.  Pt tolerated well.   Specimen was sent for labs.  EBL < 28mL  Cloretta Ned 09/18/2021 2:11 PM

## 2021-09-19 DIAGNOSIS — R7881 Bacteremia: Secondary | ICD-10-CM | POA: Diagnosis not present

## 2021-09-19 DIAGNOSIS — R6520 Severe sepsis without septic shock: Secondary | ICD-10-CM | POA: Diagnosis not present

## 2021-09-19 DIAGNOSIS — G9340 Encephalopathy, unspecified: Secondary | ICD-10-CM

## 2021-09-19 DIAGNOSIS — N170 Acute kidney failure with tubular necrosis: Secondary | ICD-10-CM | POA: Diagnosis not present

## 2021-09-19 DIAGNOSIS — A409 Streptococcal sepsis, unspecified: Secondary | ICD-10-CM | POA: Diagnosis not present

## 2021-09-19 DIAGNOSIS — B955 Unspecified streptococcus as the cause of diseases classified elsewhere: Secondary | ICD-10-CM | POA: Diagnosis not present

## 2021-09-19 LAB — CSF CELL COUNT WITH DIFFERENTIAL
Eosinophils, CSF: 0 %
Lymphs, CSF: 100 % — ABNORMAL HIGH (ref 40–80)
Monocyte-Macrophage-Spinal Fluid: 0 %
Other Cells, CSF: 0
RBC Count, CSF: 14 /mm3 — ABNORMAL HIGH (ref 0–3)
Segmented Neutrophils-CSF: 0 %
Tube #: 3
WBC, CSF: 3 /mm3 (ref 0–5)

## 2021-09-19 LAB — CULTURE, BLOOD (ROUTINE X 2)
Special Requests: ADEQUATE
Special Requests: ADEQUATE

## 2021-09-19 LAB — GLUCOSE, CAPILLARY
Glucose-Capillary: 132 mg/dL — ABNORMAL HIGH (ref 70–99)
Glucose-Capillary: 103 mg/dL — ABNORMAL HIGH (ref 70–99)
Glucose-Capillary: 126 mg/dL — ABNORMAL HIGH (ref 70–99)
Glucose-Capillary: 85 mg/dL (ref 70–99)
Glucose-Capillary: 196 mg/dL — ABNORMAL HIGH (ref 70–99)

## 2021-09-19 LAB — CREATININE, SERUM
Creatinine, Ser: 0.97 mg/dL (ref 0.61–1.24)
GFR, Estimated: >60 mL/min (ref >60)

## 2021-09-19 MED ORDER — ADULT MULTIVITAMIN W/MINERALS CH
1.0000 | ORAL_TABLET | Freq: Every day | ORAL | Status: DC
  Administered 2021-09-20 – 2021-09-27 (×7): 1 via ORAL
  Filled 2021-09-19 (×7): qty 1

## 2021-09-19 MED ORDER — PENICILLIN G POTASSIUM 20000000 UNITS IJ SOLR
12.0000 10*6.[IU] | Freq: Two times a day (BID) | INTRAVENOUS | Status: DC
  Administered 2021-09-20 – 2021-09-25 (×12): 12 10*6.[IU] via INTRAVENOUS
  Filled 2021-09-19 (×18): qty 12

## 2021-09-19 MED ORDER — SODIUM CHLORIDE 0.9 % IV SOLN
2.0000 g | Freq: Two times a day (BID) | INTRAVENOUS | Status: DC
  Administered 2021-09-19: 2 g via INTRAVENOUS
  Filled 2021-09-19: qty 20
  Filled 2021-09-19: qty 2
  Filled 2021-09-19: qty 20

## 2021-09-19 MED ORDER — PENICILLIN G POTASSIUM 20000000 UNITS IJ SOLR
4.0000 10*6.[IU] | Freq: Once | INTRAVENOUS | Status: AC
  Administered 2021-09-20: 4 10*6.[IU] via INTRAVENOUS
  Filled 2021-09-19 (×2): qty 4

## 2021-09-19 MED ORDER — ENSURE MAX PROTEIN PO LIQD
11.0000 [oz_av] | Freq: Two times a day (BID) | ORAL | Status: DC
  Administered 2021-09-19 – 2021-09-27 (×14): 11 [oz_av] via ORAL
  Filled 2021-09-19: qty 330

## 2021-09-19 NOTE — Progress Notes (Signed)
Initial Nutrition Assessment  DOCUMENTATION CODES:   Not applicable  INTERVENTION:   Ensure Max protein supplement BID, each supplement provides 150kcal and 30g of protein.  MVI po daily   NUTRITION DIAGNOSIS:   Increased nutrient needs related to acute illness as evidenced by estimated needs.  GOAL:   Patient will meet greater than or equal to 90% of their needs  MONITOR:   PO intake, Supplement acceptance, Labs, Weight trends, Skin, I & O's  REASON FOR ASSESSMENT:   Malnutrition Screening Tool    ASSESSMENT:   65 y.o. male with medical history significant for paroxysmal atrial fibrillation on Eliquis, hypertension, type II DM, CAD, cognitive impairment, schizophrenia, GERD, BPH and recent hospitalization from 07/22/2021 to 07/26/2021 for severe sepsis from group B strep bacteremia for left leg cellulitis who is admitted with fever and AMS  Pt s/p LP 10/31  Pt unavailable at time of RD visit today. Pt's lunch tray is sitting on his side table and is 100% eaten. RD will add supplements and vitamins to help pt meet his estimated needs. Per chart, pt with weight gain pta. RD will obtain NFPE and history at follow-up.   Medications reviewed and include: aspirin, vitamin D, celexa, ferrous sulfate, insulin, melatonin, protonix, ceftriaxone   Labs reviewed: K 3.9 wnl Hgb 10.3(L), Hct 30.3(L) Cbgs- 132, 103 x 24 hrs  AIC 6.0(H)- 10/30  NUTRITION - FOCUSED PHYSICAL EXAM: Unable to perform at this time   Diet Order:   Diet Order             Diet NPO time specified Except for: Sips with Meds  Diet effective midnight           Diet Heart Room service appropriate? Yes; Fluid consistency: Thin  Diet effective now                  EDUCATION NEEDS:   No education needs have been identified at this time  Skin:  Skin Assessment: Reviewed RN Assessment  Last BM:  10/31  Height:   Ht Readings from Last 1 Encounters:  09/18/21 6\' 2"  (1.88 m)    Weight:   Wt  Readings from Last 1 Encounters:  09/18/21 105.8 kg    Ideal Body Weight:  86.3 kg  BMI:  Body mass index is 29.95 kg/m.  Estimated Nutritional Needs:   Kcal:  2200-2500kcal/day  Protein:  110-125g/day  Fluid:  2.2-2.5L/day  09/20/21 MS, RD, LDN Please refer to [facility] for RD and/or RD on-call/weekend/after hours pager

## 2021-09-19 NOTE — Progress Notes (Signed)
Progress Note    Brian Dodson  [id number] DOB: 12/18/1955  DOA: 09/17/2021 PCP: Brian Nasuti, MD      Brief Narrative:    Medical records reviewed and are as summarized below:  Brian Dodson is a 65 y.o. male with medical history significant for paroxysmal atrial fibrillation on Eliquis, hypertension, type II DM, CAD, cognitive impairment, schizophrenia, recent hospitalization from 07/22/2021 to 07/26/2021 for severe sepsis from group B strep bacteremia, recent discharge from the hospital on 08/18/2021 after hospitalization for left leg cellulitis.  He was brought to the hospital because of fever and altered mental status.  His temperature was 103.3 F in the emergency room.     Assessment/Plan:   Active Problems:   Diabetes mellitus, type II (HCC)   HTN (hypertension)   CAD (coronary artery disease)   Sepsis (HCC)   Anemia of chronic disease   Acute metabolic encephalopathy   AF (paroxysmal atrial fibrillation) (HCC)   AKI (acute kidney injury) ([place])   Streptococcal bacteremia    Body mass index is 29.95 kg/m.   Severe streptococcal sepsis secondary to streptococcal bacteremia: Antibiotics have been de-escalated to IV ceftriaxone.  No evidence of meningitis from CSF analysis.  No vegetations on limited 2D echo.  Consulted Dr. Princess Dodson, cardiologist for TEE.  CAD, paroxysmal atrial fibrillation: Resume Eliquis after TEE tomorrow.    Schizophrenia, cognitive impairment: Continue psychotropics  Acute metabolic encephalopathy and AKI: Resolved  Other comorbidities include type II DM, hypertension, anemia of chronic disease, glaucoma    Diet Order             Diet NPO time specified Except for: Sips with Meds  Diet effective midnight           Diet Heart Room service appropriate? Yes; Fluid consistency: Thin  Diet effective now                      Consultants: Infectious disease  Procedures: None    Medications:    amiodarone  200 mg  Oral Daily   aspirin EC  81 mg Oral Daily   atorvastatin  10 mg Oral Daily   benztropine  1 mg Oral QHS   brimonidine  1 drop Both Eyes BID   And   timolol  1 drop Both Eyes BID   cholecalciferol  2,000 Units Oral Daily   citalopram  20 mg Oral Daily   divalproex  2,000 mg Oral QHS   donepezil  10 mg Oral QHS   ferrous sulfate  325 mg Oral Q breakfast   insulin aspart  0-15 Units Subcutaneous Q4H   latanoprost  1 drop Both Eyes QHS   melatonin  5 mg Oral QHS   [START ON 09/20/2021] multivitamin with minerals  1 tablet Oral Daily   pantoprazole  40 mg Oral BID   Ensure Max Protein  11 oz Oral BID   risperidone  4 mg Oral QHS   Continuous Infusions:  cefTRIAXone (ROCEPHIN)  IV 2 g (09/19/21 0901)     Anti-infectives (From admission, onward)    Start     Dose/Rate Route Frequency Ordered Stop   09/19/21 1000  cefTRIAXone (ROCEPHIN) 2 g in sodium chloride 0.9 % 100 mL IVPB  Status:  Discontinued        2 g 200 mL/hr over 30 Minutes Intravenous Every 24 hours 09/18/21 2226 09/19/21 0802   09/19/21 0800  cefTRIAXone (ROCEPHIN) 2 g in sodium chloride 0.9 %  100 mL IVPB        2 g 200 mL/hr over 30 Minutes Intravenous Every 12 hours 09/19/21 0802     09/18/21 1800  vancomycin (VANCOREADY) IVPB 1250 mg/250 mL  Status:  Discontinued        1,250 mg 166.7 mL/hr over 90 Minutes Intravenous Every 12 hours 09/18/21 1416 09/18/21 2226   09/17/21 1900  vancomycin (VANCOREADY) IVPB 750 mg/150 mL  Status:  Discontinued        750 mg 150 mL/hr over 60 Minutes Intravenous Every 12 hours 09/17/21 0624 09/18/21 1416   09/17/21 1400  cefTRIAXone (ROCEPHIN) 2 g in sodium chloride 0.9 % 100 mL IVPB  Status:  Discontinued        2 g 200 mL/hr over 30 Minutes Intravenous Every 12 hours 09/17/21 0610 09/18/21 2226   09/17/21 0630  ampicillin (OMNIPEN) 2 g in sodium chloride 0.9 % 100 mL IVPB  Status:  Discontinued        2 g 300 mL/hr over 20 Minutes Intravenous Every 4 hours 09/17/21 0615 09/18/21  2226   09/17/21 0615  vancomycin (VANCOCIN) IVPB 1000 mg/200 mL premix  Status:  Discontinued        1,000 mg 200 mL/hr over 60 Minutes Intravenous  Once 09/17/21 0610 09/17/21 0617   09/17/21 0615  ampicillin (OMNIPEN) 2 g in sodium chloride 0.9 % 100 mL IVPB  Status:  Discontinued        2 g 300 mL/hr over 20 Minutes Intravenous  Once 09/17/21 0610 09/17/21 0614   09/17/21 0530  vancomycin (VANCOREADY) IVPB 2000 mg/400 mL       See Hyperspace for full Linked Orders Report.   2,000 mg 200 mL/hr over 120 Minutes Intravenous  Once 09/17/21 0518 09/17/21 0940   09/17/21 0530  vancomycin (VANCOREADY) IVPB 500 mg/100 mL       See Hyperspace for full Linked Orders Report.   500 mg 100 mL/hr over 60 Minutes Intravenous  Once 09/17/21 0518 09/17/21 0732   09/17/21 0515  vancomycin (VANCOCIN) IVPB 1000 mg/200 mL premix  Status:  Discontinued        1,000 mg 200 mL/hr over 60 Minutes Intravenous  Once 09/17/21 0513 09/17/21 0518   09/17/21 0515  ampicillin (OMNIPEN) 2 g in sodium chloride 0.9 % 100 mL IVPB  Status:  Discontinued        2 g 300 mL/hr over 20 Minutes Intravenous  Once 09/17/21 0513 09/17/21 0614   09/17/21 0145  cefTRIAXone (ROCEPHIN) 2 g in sodium chloride 0.9 % 100 mL IVPB  Status:  Discontinued        2 g 200 mL/hr over 30 Minutes Intravenous Every 24 hours 09/17/21 0137 09/17/21 [id number]              Family Communication/Anticipated D/C date and plan/Code Status   DVT prophylaxis: Place and maintain sequential compression device Start: 09/17/21 1239 SCDs Start: 09/17/21 0608     Code Status: Full Code  Family Communication: None Disposition Plan: Discharge to SNF when medically ready   Status is: Inpatient  Remains inpatient appropriate because: Bacteremia on IV antibiotics           Subjective:   No complaints.  No shortness of breath, fever, cough or chest pain.  Objective:    Vitals:   09/18/21 1830 09/18/21 1951 09/19/21 0412 09/19/21 0738   BP: 101/74 100/68 122/76 108/81  Pulse: (!) 121 69 69 73  Resp:  18 20 18  Temp:  98 F (36.7 C) 98.2 F (36.8 C) 98.5 F (36.9 C)  TempSrc:  Oral Oral Oral  SpO2: 100% 100% 98% 100%  Weight:  105.8 kg    Height:  6\' 2"  (1.88 m)     No data found.   Intake/Output Summary (Last 24 hours) at 09/19/2021 1356 Last data filed at 09/19/2021 0300 Gross per 24 hour  Intake 340 ml  Output --  Net 340 ml   Filed Weights   09/17/21 0223 09/18/21 1951  Weight: 102.8 kg 105.8 kg    Exam:   GEN: NAD SKIN: No rash EYES: EOMI ENT: MMM CV: RRR PULM: CTA B ABD: soft, ND, NT, +BS CNS: AAO x 3, non focal EXT: No edema or tenderness        Data Reviewed:   I have personally reviewed following labs and imaging studies:  Labs: Labs show the following:   Basic Metabolic Panel: Recent Labs  Lab 09/17/21 0200 09/18/21 0936 09/19/21 0539  NA 136 135  --   K 4.0 3.9  --   CL 101 102  --   CO2 26 28  --   GLUCOSE 108* 105*  --   BUN 25* 19  --   CREATININE 1.50* 1.06 0.97  CALCIUM 8.9 8.4*  --    GFR Estimated Creatinine Clearance: 97.1 mL/min (by C-G formula based on SCr of 0.97 mg/dL). Liver Function Tests: Recent Labs  Lab 09/17/21 0200  AST 22  ALT 15  ALKPHOS 44  BILITOT 0.7  PROT 7.1  ALBUMIN 3.4*   Recent Labs  Lab 09/17/21 0200  LIPASE 35   No results for input(s): AMMONIA in the last 168 hours. Coagulation profile Recent Labs  Lab 09/17/21 0200 09/18/21 0936  INR 1.2 1.0    CBC: Recent Labs  Lab 09/17/21 0200 09/18/21 0936  WBC 10.3 8.1  NEUTROABS 8.6* 4.7  HGB 10.3* 10.3*  HCT 30.6* 30.3*  MCV 102.3* 101.0*  PLT 121* 104*   Cardiac Enzymes: No results for input(s): CKTOTAL, CKMB, CKMBINDEX, TROPONINI in the last 168 hours. BNP (last 3 results) No results for input(s): PROBNP in the last 8760 hours. CBG: Recent Labs  Lab 09/18/21 2000 09/18/21 2334 09/19/21 0409 09/19/21 0739 09/19/21 1142  GLUCAP 223* 118* 132* 103*  126*   D-Dimer: No results for input(s): DDIMER in the last 72 hours. Hgb A1c: Recent Labs    09/17/21 0200  HGBA1C 6.0*   Lipid Profile: No results for input(s): CHOL, HDL, LDLCALC, TRIG, CHOLHDL, LDLDIRECT in the last 72 hours. Thyroid function studies: No results for input(s): TSH, T4TOTAL, T3FREE, THYROIDAB in the last 72 hours.  Invalid input(s): FREET3 Anemia work up: No results for input(s): VITAMINB12, FOLATE, FERRITIN, TIBC, IRON, RETICCTPCT in the last 72 hours. Sepsis Labs: Recent Labs  Lab 09/17/21 0200 09/18/21 0936  PROCALCITON 0.24 0.54  WBC 10.3 8.1  LATICACIDVEN 1.7  --     Microbiology Recent Results (from the past 240 hour(s))  Blood Culture (routine x 2)     Status: Abnormal   Collection Time: 09/17/21  2:00 AM   Specimen: BLOOD  Result Value Ref Range Status   Specimen Description   Final    BLOOD RAC Performed at [facility], [address] [place], [place] [address]    Special Requests   Final    BOTTLES DRAWN AEROBIC AND ANAEROBIC Blood Culture adequate volume Performed at [facility], [address] [place], [place] [address]  Culture  Setup Time   Final    GRAM POSITIVE COCCI IN BOTH AEROBIC AND ANAEROBIC BOTTLES CRITICAL RESULT CALLED TO, READ BACK BY AND VERIFIED WITH: WALID NAZARI 09/17/21 1426 AMK    Culture STREPTOCOCCUS GROUP G (A)  Final   Report Status 09/19/2021 FINAL  Final   Organism ID, Bacteria STREPTOCOCCUS GROUP G  Final      Susceptibility   Streptococcus group g - MIC*    CLINDAMYCIN RESISTANT Resistant     AMPICILLIN <=0.25 SENSITIVE Sensitive     ERYTHROMYCIN >=8 RESISTANT Resistant     VANCOMYCIN 0.5 SENSITIVE Sensitive     CEFTRIAXONE <=0.12 SENSITIVE Sensitive     LEVOFLOXACIN 0.5 SENSITIVE Sensitive     PENICILLIN Value in next row Sensitive      SENSITIVE0.06    * STREPTOCOCCUS GROUP G  Blood Culture (routine x 2)     Status: Abnormal   Collection Time: 09/17/21  2:00 AM    Specimen: BLOOD  Result Value Ref Range Status   Specimen Description   Final    BLOOD RH Performed at [facility], [address] [place], [place] [address]    Special Requests   Final    BOTTLES DRAWN AEROBIC AND ANAEROBIC Blood Culture adequate volume Performed at [facility], [address] [place], [place] [address]    Culture  Setup Time   Final    GRAM POSITIVE COCCI IN BOTH AEROBIC AND ANAEROBIC BOTTLES CRITICAL RESULT CALLED TO, READ BACK BY AND VERIFIED WITH: WALID NAZARI 09/17/21 1426 AMK    Culture (A)  Final    STREPTOCOCCUS GROUP G SUSCEPTIBILITIES PERFORMED ON PREVIOUS CULTURE WITHIN THE LAST 5 DAYS. Performed at [facility], [address], [place], [place] [address]    Report Status 09/19/2021 FINAL  Final  Urine Culture     Status: None   Collection Time: 09/17/21  2:00 AM   Specimen: In/Out Cath Urine  Result Value Ref Range Status   Specimen Description   Final    IN/OUT CATH URINE Performed at [facility], [address], [place], [place] [address]    Special Requests   Final    NONE Performed at [facility], [address], [place], [place] [address]    Culture   Final    NO GROWTH Performed at [facility], [address], [place], [place] [address]    Report Status 09/18/2021 FINAL  Final  Resp Panel by RT-PCR (Flu A&B, Covid) Nasopharyngeal Swab     Status: None   Collection Time: 09/17/21  2:00 AM   Specimen: Nasopharyngeal Swab; Nasopharyngeal(NP) swabs in vial transport medium  Result Value Ref Range Status   SARS Coronavirus 2 by RT PCR NEGATIVE NEGATIVE Final    Comment: (NOTE) SARS-CoV-2 target nucleic acids are NOT DETECTED.  The SARS-CoV-2 RNA is generally detectable in upper respiratory specimens during the acute phase of infection. The lowest concentration of SARS-CoV-2 viral copies this assay can detect is 138 copies/mL. A negative result does not preclude  SARS-Cov-2 infection and should not be used as the sole basis for treatment or other patient management decisions. A negative result may occur with  improper specimen collection/handling, submission of specimen other than nasopharyngeal swab, presence of viral mutation(s) within the areas targeted by this assay, and inadequate number of viral copies(<138 copies/mL). A negative result must be combined with clinical observations, patient history, and epidemiological information. The expected result is Negative.  Fact Sheet for Patients:  EntrepreneurPulse.com.au  Fact Sheet for Healthcare Providers:  IncredibleEmployment.be  This test is no t yet approved or cleared by the [place] FDA and  has been authorized for detection and/or diagnosis of SARS-CoV-2 by FDA under an Emergency Use Authorization (EUA). This EUA will remain  in effect (meaning this test can be used) for the duration of the COVID-19 declaration under Section 564(b)(1) of the Act, 21 U.S.C.section 360bbb-3(b)(1), unless the authorization is terminated  or revoked sooner.       Influenza A by PCR NEGATIVE NEGATIVE Final   Influenza B by PCR NEGATIVE NEGATIVE Final    Comment: (NOTE) The Xpert Xpress SARS-CoV-2/FLU/RSV plus assay is intended as an aid in the diagnosis of influenza from Nasopharyngeal swab specimens and should not be used as a sole basis for treatment. Nasal washings and aspirates are unacceptable for Xpert Xpress SARS-CoV-2/FLU/RSV testing.  Fact Sheet for Patients: EntrepreneurPulse.com.au  Fact Sheet for Healthcare Providers: IncredibleEmployment.be  This test is not yet approved or cleared by the [place] FDA and has been authorized for detection and/or diagnosis of SARS-CoV-2 by FDA under an Emergency Use Authorization (EUA). This EUA will remain in effect (meaning this test can be used) for the duration of  the COVID-19 declaration under Section 564(b)(1) of the Act, 21 U.S.C. section 360bbb-3(b)(1), unless the authorization is terminated or revoked.  Performed at [facility], [address] [place], [place] [address]   Blood Culture ID Panel (Reflexed)     Status: Abnormal   Collection Time: 09/17/21  2:00 AM  Result Value Ref Range Status   Enterococcus faecalis NOT DETECTED NOT DETECTED Final   Enterococcus Faecium NOT DETECTED NOT DETECTED Final   Listeria monocytogenes NOT DETECTED NOT DETECTED Final   Staphylococcus species NOT DETECTED NOT DETECTED Final   Staphylococcus aureus (BCID) NOT DETECTED NOT DETECTED Final   Staphylococcus epidermidis NOT DETECTED NOT DETECTED Final   Staphylococcus lugdunensis NOT DETECTED NOT DETECTED Final   Streptococcus species DETECTED (A) NOT DETECTED Final    Comment: Not Enterococcus species, Streptococcus agalactiae, Streptococcus pyogenes, or Streptococcus pneumoniae. CRITICAL RESULT CALLED TO, READ BACK BY AND VERIFIED WITH: WALID NAZARI 09/17/21 1426 AMK    Streptococcus agalactiae NOT DETECTED NOT DETECTED Final   Streptococcus pneumoniae NOT DETECTED NOT DETECTED Final   Streptococcus pyogenes NOT DETECTED NOT DETECTED Final   A.calcoaceticus-baumannii NOT DETECTED NOT DETECTED Final   Bacteroides fragilis NOT DETECTED NOT DETECTED Final   Enterobacterales NOT DETECTED NOT DETECTED Final   Enterobacter cloacae complex NOT DETECTED NOT DETECTED Final   Escherichia coli NOT DETECTED NOT DETECTED Final   Klebsiella aerogenes NOT DETECTED NOT DETECTED Final   Klebsiella oxytoca NOT DETECTED NOT DETECTED Final   Klebsiella pneumoniae NOT DETECTED NOT DETECTED Final   Proteus species NOT DETECTED NOT DETECTED Final   Salmonella species NOT DETECTED NOT DETECTED Final   Serratia marcescens NOT DETECTED NOT DETECTED Final   Haemophilus influenzae NOT DETECTED NOT DETECTED Final   Neisseria meningitidis NOT DETECTED NOT DETECTED  Final   Pseudomonas aeruginosa NOT DETECTED NOT DETECTED Final   Stenotrophomonas maltophilia NOT DETECTED NOT DETECTED Final   Candida albicans NOT DETECTED NOT DETECTED Final   Candida auris NOT DETECTED NOT DETECTED Final   Candida glabrata NOT DETECTED NOT DETECTED Final   Candida krusei NOT DETECTED NOT DETECTED Final   Candida parapsilosis NOT DETECTED NOT DETECTED Final   Candida tropicalis NOT DETECTED NOT DETECTED Final   Cryptococcus neoformans/gattii  NOT DETECTED NOT DETECTED Final    Comment: Performed at [facility], [address] [place], [place] [address]  CSF culture w Gram Stain     Status: None (Preliminary result)   Collection Time: 09/18/21  2:14 PM   Specimen: PATH Cytology CSF; Cerebrospinal Fluid  Result Value Ref Range Status   Specimen Description   Final    CSF Performed at [facility], [address], [place], [place] [address]    Special Requests   Final    NONE Performed at [facility], [address] [place], [place] [address]    Gram Stain   Final    WBC PRESENT, PREDOMINANTLY MONONUCLEAR NO ORGANISMS SEEN CYTOSPIN SMEAR    Culture   Final    NO GROWTH < 24 HOURS Performed at [facility], [place] [address], [place], [place] [address]    Report Status PENDING  Incomplete    Procedures and diagnostic studies:  ECHOCARDIOGRAM LIMITED  Result Date: 09/18/2021    ECHOCARDIOGRAM LIMITED REPORT   Patient Name:   Brian Dodson Date of Exam: 09/18/2021 Medical Rec #:  [id number]     Height:       73.0 in Accession #:    [id number]    Weight:       226.6 lb Date of Birth:  11/08/1956    BSA:          2.269 m Patient Age:    63 years      BP:           113/80 mmHg Patient Gender: M             HR:           65 bpm. Exam Location:  [facility] Procedure: Limited Echo, Color Doppler and Cardiac Doppler Indications:     Bacteremia R78.81  History:         Patient has prior history of Echocardiogram examinations, most                   recent 07/23/2021. COPD, Arrythmias:Atrial Fibrillation; Risk                  Factors:Diabetes.  Sonographer:     Sherrie Sport Referring Phys:  [id number] Jennye Boroughs Diagnosing Phys: Nelva Bush MD  Sonographer Comments: Suboptimal apical window. IMPRESSIONS  1. Left ventricular ejection fraction, by estimation, is 60 to 65%. The left ventricle has normal function. The left ventricle has no regional wall motion abnormalities. There is mild left ventricular hypertrophy. Left ventricular diastolic function could not be evaluated.  2. Right ventricular systolic function was not well visualized. The right ventricular size is mildly enlarged. Tricuspid regurgitation signal is inadequate for assessing PA pressure.  3. Right atrial size was severely dilated.  4. The mitral valve is abnormal. Trivial mitral valve regurgitation. No evidence of mitral stenosis.  5. The aortic valve is tricuspid. There is mild calcification of the aortic valve. There is mild thickening of the aortic valve. Mild aortic valve sclerosis is present, with no evidence of aortic valve stenosis.  6. There is mild dilatation of the ascending aorta, measuring 38 mm. FINDINGS  Left Ventricle: Left ventricular ejection fraction, by estimation, is 60 to 65%. The left ventricle has normal function. The left ventricle has no regional wall motion abnormalities. The left ventricular internal cavity size was normal in size. There is  mild left ventricular hypertrophy. Left ventricular diastolic function could not be  evaluated. Left ventricular diastolic function could not be evaluated due to atrial fibrillation. Right Ventricle: The right ventricular size is mildly enlarged. Right ventricular systolic function was not well visualized. Tricuspid regurgitation signal is inadequate for assessing PA pressure. Left Atrium: Left atrial size was normal in size. Right Atrium: Right atrial size was severely dilated. Pericardium: There is no evidence of pericardial  effusion. Mitral Valve: The mitral valve is abnormal. There is mild thickening of the mitral valve leaflet(s). Mild to moderate mitral annular calcification. Trivial mitral valve regurgitation. No evidence of mitral valve stenosis. MV peak gradient, 4.4 mmHg. The mean mitral valve gradient is 2.0 mmHg. Tricuspid Valve: The tricuspid valve is normal in structure. Tricuspid valve regurgitation is trivial. Aortic Valve: The aortic valve is tricuspid. There is mild calcification of the aortic valve. There is mild thickening of the aortic valve. Mild aortic valve sclerosis is present, with no evidence of aortic valve stenosis. Aortic valve mean gradient measures 2.0 mmHg. Aortic valve peak gradient measures 3.7 mmHg. Aortic valve area, by VTI measures 3.67 cm. Pulmonic Valve: The pulmonic valve was not well visualized. Pulmonic valve regurgitation is not visualized. No evidence of pulmonic stenosis. Aorta: The aortic root is normal in size and structure. There is mild dilatation of the ascending aorta, measuring 38 mm. Pulmonary Artery: The pulmonary artery is of normal size. Venous: The inferior vena cava was not well visualized. LEFT VENTRICLE PLAX 2D LVIDd:         4.40 cm LVIDs:         2.90 cm LV PW:         1.20 cm LV IVS:        1.05 cm LVOT diam:     2.30 cm LV SV:         62 LV SV Index:   27 LVOT Area:     4.15 cm  RIGHT VENTRICLE RV Basal diam:  4.80 cm RV S prime:     14.30 cm/s LEFT ATRIUM             Index        RIGHT ATRIUM           Index LA diam:        4.20 cm 1.85 cm/m   RA Area:     36.60 cm LA Vol (A2C):   82.2 ml 36.23 ml/m  RA Volume:   152.00 ml 66.99 ml/m LA Vol (A4C):   50.2 ml 22.13 ml/m LA Biplane Vol: 70.4 ml 31.03 ml/m  AORTIC VALVE                    PULMONIC VALVE AV Area (Vmax):    3.55 cm     PV Vmax:        0.76 m/s AV Area (Vmean):   3.26 cm     PV Vmean:       54.100 cm/s AV Area (VTI):     3.67 cm     PV VTI:         0.144 m AV Vmax:           96.10 cm/s   PV Peak grad:    2.3 mmHg AV Vmean:          68.000 cm/s  PV Mean grad:   1.0 mmHg AV VTI:            0.170 m      RVOT Peak grad: 4 mmHg AV Peak Grad:  3.7 mmHg AV Mean Grad:      2.0 mmHg LVOT Vmax:         82.20 cm/s LVOT Vmean:        53.400 cm/s LVOT VTI:          0.150 m LVOT/AV VTI ratio: 0.88  AORTA Ao Root diam: 3.70 cm MITRAL VALVE MV Area (PHT): 3.81 cm    SHUNTS MV Area VTI:   2.31 cm    Systemic VTI:  0.15 m MV Peak grad:  4.4 mmHg    Systemic Diam: 2.30 cm MV Mean grad:  2.0 mmHg    Pulmonic VTI:  0.186 m MV Vmax:       1.05 m/s MV Vmean:      66.5 cm/s MV Decel Time: 199 msec MV E velocity: 89.10 cm/s Nelva Bush MD Electronically signed by Nelva Bush MD Signature Date/Time: 09/18/2021/2:50:51 PM    Final    DG FL GUIDED LUMBAR PUNCTURE  Result Date: 09/18/2021 CLINICAL DATA:  Altered mental status with a fever and bacteremia, request received for diagnostic lumbar puncture. EXAM: DIAGNOSTIC LUMBAR PUNCTURE UNDER FLUOROSCOPIC GUIDANCE COMPARISON:  CT head performed 09/17/2021 report reviewed prior to the procedure. FLUOROSCOPY TIME:  Fluoroscopy Time:  0.2 minute Radiation Exposure Index (if provided by the fluoroscopic device): 9.6 mGy Number of Acquired Spot Images: 2 PROCEDURE: Informed consent was obtained from the patient's legal guardian prior to the procedure, including potential complications of bleeding, infection, CSF leak requiring additional procedures, paresthesias, postprocedure requirements including the need for the patient to lay flat for several hours after the procedure, headache, allergy, and pain. With the patient prone, the lower back was prepped with Betadine. 1% Lidocaine was used for local anesthesia. Lumbar puncture was performed at the L3-L4 level using a 20 gauge needle with return of colorless CSF with an opening pressure of 17 cm water. Nine ml of CSF were obtained for laboratory studies. The patient tolerated the procedure well and there were no apparent  complications. A sterile bandage was applied. IMPRESSION: Technically successful fluoroscopic guided lumbar puncture. Read By: Tsosie Billing PA-C Electronically Signed   By: Delanna Ahmadi M.D.   On: 09/18/2021 14:24               LOS: 2 days   Bruna Dills  [facility]   Pager on [email or web address]. If 7PM-7AM, please contact night-coverage at [email or web address]     09/19/2021, 1:56 PM

## 2021-09-19 NOTE — Progress Notes (Signed)
Mobility Specialist - Progress Note   09/19/21 1100  Mobility  Activity Ambulated in hall;Transferred:  Bed to chair  Level of Assistance Standby assist, set-up cues, supervision of patient - no hands on  Assistive Device Front wheel walker  Distance Ambulated (ft) 320 ft  Mobility Ambulated with assistance in hallway;Out of bed to chair with meals  Mobility Response Tolerated well  Mobility performed by Mobility specialist  $Mobility charge 1 Mobility    Mobility responded to bed alarm, pt sitting EOB on arrival. RN cleared pt for activity. Ambulated in hallway with supervision. VC to maintain safe distance to RW. Mildly fatigue with activity. Slow gait. Pt left in recliner prior to exit, alarm set.    Filiberto Pinks Mobility Specialist 09/19/21, 11:47 AM

## 2021-09-19 NOTE — Anesthesia Preprocedure Evaluation (Addendum)
Anesthesia Evaluation  Patient identified by MRN, date of birth, ID band Patient awake    Reviewed: Allergy & Precautions, NPO status , Patient's Chart, lab work & pertinent test results  History of Anesthesia Complications Negative for: history of anesthetic complications  Airway Mallampati: II  TM Distance: >3 FB Neck ROM: Full    Dental  (+) Edentulous Upper, Edentulous Lower   Pulmonary shortness of breath and with exertion, COPD, former smoker,    breath sounds clear to auscultation- rhonchi (-) wheezing      Cardiovascular hypertension, Pt. on medications + CAD and + DOE  + dysrhythmias (paroxysmal atrial fibrillation on Eliquis) Atrial Fibrillation  Rhythm:Regular Rate:Normal - Systolic murmurs and - Diastolic murmurs ECHO 08/2021: 1. Left ventricular ejection fraction, by estimation, is 60 to 65%. The  left ventricle has normal function. The left ventricle has no regional  wall motion abnormalities. There is mild left ventricular hypertrophy.  Left ventricular diastolic function  could not be evaluated.  2. Right ventricular systolic function was not well visualized. The right  ventricular size is mildly enlarged. Tricuspid regurgitation signal is  inadequate for assessing PA pressure.  3. Right atrial size was severely dilated.  4. The mitral valve is abnormal. Trivial mitral valve regurgitation. No  evidence of mitral stenosis.  5. The aortic valve is tricuspid. There is mild calcification of the  aortic valve. There is mild thickening of the aortic valve. Mild aortic  valve sclerosis is present, with no evidence of aortic valve stenosis.  6. There is mild dilatation of the ascending aorta, measuring 38 mm.   Neuro/Psych neg Seizures PSYCHIATRIC DISORDERS (cognitive impairment) Schizophrenia Dementia negative neurological ROS     GI/Hepatic Neg liver ROS, GERD  ,  Endo/Other  diabetes, Type 2   Renal/GU negative Renal ROS   BPH    Musculoskeletal negative musculoskeletal ROS (+)   Abdominal (+) - obese,   Peds negative pediatric ROS (+)  Hematology  (+) anemia , bacteremia   Anesthesia Other Findings Pt with recurrent Streptococcus Group G bacteremia for left leg cellulitis who is admitted with fever and AMS.   Reproductive/Obstetrics negative OB ROS                         Anesthesia Physical Anesthesia Plan  ASA: 3  Anesthesia Plan: General   Post-op Pain Management:    Induction: Intravenous  PONV Risk Score and Plan: 1 and Propofol infusion  Airway Management Planned: Natural Airway  Additional Equipment:   Intra-op Plan:   Post-operative Plan:   Informed Consent: I have reviewed the patients History and Physical, chart, labs and discussed the procedure including the risks, benefits and alternatives for the proposed anesthesia with the patient or authorized representative who has indicated his/her understanding and acceptance.     Dental advisory given and Consent reviewed with POA (phone consent from legal guardian Lenn Sink)  Plan Discussed with: CRNA and Anesthesiologist  Anesthesia Plan Comments:        Anesthesia Quick Evaluation

## 2021-09-19 NOTE — TOC Initial Note (Signed)
Transition of Care [facility]) - Initial/Assessment Note    Patient Details  Name: Brian Dodson MRN: [id number] Date of Birth: July 13, 1956  Transition of Care [facility]) CM/SW Contact:    Allayne Butcher, RN Phone Number: 09/19/2021, 4:29 PM  Clinical Narrative:                 Patient admitted to the hospital with sepsis, this is the 3rd admission for similar symptoms.  ID is following and they want to do a TEE.  Message left for the patient's DSS guardian to return call - patient needs a consent signed.   Patient is from [facility], plan will be for patient to return to [facility] as long as no IV antibiotics are needed at DC.  If IV antibiotics needed patient will need to go for SNF to manage.  Patient is independent in the room.  He is pleasant and engaged.  RNCM updated his niece Tish who is very involved in her Uncle's care and reports that she is trying to become his guardian, she was grateful for the call and update.  RNCM also spoke with Tammy at [facility].  TOC will cont to follow.    Expected Discharge Plan: Assisted Living Barriers to Discharge: Continued Medical Work up   Patient Goals and CMS Choice Patient states their goals for this hospitalization and ongoing recovery are:: patient wants to go back to [facility] when medically ready      Expected Discharge Plan and Services Expected Discharge Plan: Assisted Living   Discharge Planning Services: CM Consult Post Acute Care Choice: Resumption of Svcs/PTA Provider Living arrangements for the past 2 months: Assisted Living Facility                 DME Arranged: N/A DME Agency: NA       HH Arranged: NA          Prior Living Arrangements/Services Living arrangements for the past 2 months: Assisted Living Facility Lives with:: Facility Resident Patient language and need for interpreter reviewed:: Yes Do you feel safe going back to the place where you live?: Yes      Need for Family Participation in Patient Care:  Yes (Comment) (sepsis) Care giver support system in place?: Yes (comment) (niece)   Criminal Activity/Legal Involvement Pertinent to Current Situation/Hospitalization: No - Comment as needed  Activities of Daily Living Home Assistive Devices/Equipment: Environmental consultant (specify type) ADL Screening (condition at time of admission) Patient's cognitive ability adequate to safely complete daily activities?: Yes Is the patient deaf or have difficulty hearing?: No Does the patient have difficulty seeing, even when wearing glasses/contacts?: No Does the patient have difficulty concentrating, remembering, or making decisions?: Yes Patient able to express need for assistance with ADLs?: Yes Does the patient have difficulty dressing or bathing?: No Independently performs ADLs?: Yes (appropriate for developmental age) Does the patient have difficulty walking or climbing stairs?: Yes Weakness of Legs: None Weakness of Arms/Hands: None  Permission Sought/Granted Permission sought to share information with : Case Manager, Family Supports, Magazine features editor, Guardian Permission granted to share information with : Yes, Verbal Permission Granted  Share Information with NAME: Lenn Sink and [employer]  Permission granted to share info w AGENCY: [facility]  Permission granted to share info w Relationship: guardian and niece     Emotional Assessment Appearance:: Appears stated age Attitude/Demeanor/Rapport: Engaged, Charismatic, Gracious Affect (typically observed): Accepting, Happy, Pleasant Orientation: : Oriented to Self, Oriented to Place, Oriented to  Time, Oriented to  Situation Alcohol / Substance Use: Not Applicable Psych Involvement: No (comment)  Admission diagnosis:  Fever [R50.9] Acute encephalopathy [G93.40] Acute kidney injury (HCC) [N17.9] Streptococcal bacteremia [R78.81, B95.5] Sepsis (HCC) [A41.9] Fever, unspecified fever cause [R50.9] Patient Active Problem List    Diagnosis Date Noted   Streptococcal bacteremia 09/18/2021   AKI (acute kidney injury) (HCC) 09/17/2021   Cellulitis 08/15/2021   Sepsis secondary to UTI (HCC) 03/05/2021   Acute metabolic encephalopathy 03/05/2021   Dementia (HCC) 03/05/2021   Memory loss due to medical condition 03/05/2021   AF (paroxysmal atrial fibrillation) (HCC) 03/05/2021   Atrial flutter (HCC) 03/05/2021   Diabetes mellitus type 2, controlled, without complications (HCC) 03/05/2021   Obese 03/05/2021   Anemia of chronic disease 03/04/2021   Acute lower UTI 03/02/2021   Acute febrile illness    Bilateral lower extremity edema 04/01/2018   Lower extremity pain, bilateral 04/01/2018   Swelling of limb 10/04/2017   Sepsis (HCC) 05/20/2017   Anemia 05/07/2017   B12 deficiency 05/07/2017   Loss of memory 05/07/2017   Atrial fibrillation with RVR (HCC) 05/23/2015   Syncope 05/23/2015   Schizophrenia (HCC) 05/23/2015   Diabetes mellitus, type II (HCC) 05/23/2015   HTN (hypertension) 05/23/2015   GERD (gastroesophageal reflux disease) 05/23/2015   CAD (coronary artery disease) 05/23/2015   PCP:  Galvin Proffer, MD Pharmacy:   [facility] - [place], [place] - [address] [place] [address] Phone: [phone] Fax: [phone]  [facility] - [place], [place] - [address] [place] [address] Phone: [phone] Fax: [phone]     Social Determinants of Health (SDOH) Interventions    Readmission Risk Interventions Readmission Risk Prevention Plan 09/19/2021  Transportation Screening Complete  PCP or Specialist Appt within 3-5 Days Complete  HRI or Home Care Consult Complete  Social Work Consult for Recovery Care Planning/Counseling Complete  Palliative Care Screening Not Applicable  Medication Review Oceanographer) Complete  Some recent data might be hidden

## 2021-09-20 ENCOUNTER — Inpatient Hospital Stay
Admit: 2021-09-20 | Discharge: 2021-09-20 | Disposition: A | Discharge status: Home / Self Care | Payer: Medicare Other | Attending: Cardiology | Admitting: Cardiology

## 2021-09-20 ENCOUNTER — Encounter: Payer: Self-pay | Admitting: Internal Medicine

## 2021-09-20 ENCOUNTER — Inpatient Hospital Stay: Payer: Medicare Other | Admitting: Anesthesiology

## 2021-09-20 ENCOUNTER — Encounter
Admission: EM | Disposition: A | Discharge status: Skilled Nursing Facility | Payer: Self-pay | Source: Skilled Nursing Facility | Attending: Internal Medicine

## 2021-09-20 ENCOUNTER — Other Ambulatory Visit: Payer: Self-pay

## 2021-09-20 DIAGNOSIS — B955 Unspecified streptococcus as the cause of diseases classified elsewhere: Secondary | ICD-10-CM | POA: Diagnosis not present

## 2021-09-20 DIAGNOSIS — G9341 Metabolic encephalopathy: Secondary | ICD-10-CM | POA: Diagnosis not present

## 2021-09-20 DIAGNOSIS — R7881 Bacteremia: Secondary | ICD-10-CM | POA: Diagnosis not present

## 2021-09-20 HISTORY — PX: TEE WITHOUT CARDIOVERSION: SHX5443

## 2021-09-20 LAB — GLUCOSE, CAPILLARY
Glucose-Capillary: 112 mg/dL — ABNORMAL HIGH (ref 70–99)
Glucose-Capillary: 139 mg/dL — ABNORMAL HIGH (ref 70–99)
Glucose-Capillary: 142 mg/dL — ABNORMAL HIGH (ref 70–99)
Glucose-Capillary: 148 mg/dL — ABNORMAL HIGH (ref 70–99)
Glucose-Capillary: 175 mg/dL — ABNORMAL HIGH (ref 70–99)
Glucose-Capillary: 99 mg/dL (ref 70–99)

## 2021-09-20 LAB — VDRL, CSF: VDRL Quant, CSF: NONREACTIVE

## 2021-09-20 SURGERY — ECHOCARDIOGRAM, TRANSESOPHAGEAL
Anesthesia: General

## 2021-09-20 MED ORDER — PROPOFOL 10 MG/ML IV BOLUS
INTRAVENOUS | Status: DC | PRN
  Administered 2021-09-20: 100 mg via INTRAVENOUS

## 2021-09-20 MED ORDER — SODIUM CHLORIDE 0.9 % IV SOLN: INTRAVENOUS | Status: DC

## 2021-09-20 NOTE — Progress Notes (Signed)
*  PRELIMINARY RESULTS* Echocardiogram Echocardiogram Transesophageal has been performed.  Brian Dodson 09/20/2021, 12:28 PM

## 2021-09-20 NOTE — CV Procedure (Signed)
   TRANSESOPHAGEAL ECHOCARDIOGRAM   NAME:  Brian Dodson   MRN: [id number] DOB:  08-27-1956   ADMIT DATE: 09/17/2021  INDICATIONS:  Bacteremia  PROCEDURE:   Informed consent was obtained prior to the procedure. The risks, benefits and alternatives for the procedure were discussed and the patient comprehended these risks.  Risks include, but are not limited to, cough, sore throat, vomiting, nausea, somnolence, esophageal and stomach trauma or perforation, bleeding, low blood pressure, aspiration, pneumonia, infection, trauma to the teeth and death.    After a procedural time-out, the patient was given propophol per dept of anestesia.   The transesophageal probe was inserted in the esophagus and stomach without difficulty and multiple views were obtained. I was present for the entire procedure.    COMPLICATIONS:    There were no immediate complications.  FINDINGS:  LEFT VENTRICLE: EF = 55%. No regional wall motion abnormalities.  RIGHT VENTRICLE: Normal size and function.   LEFT ATRIUM:Normal with no masses.  LEFT ATRIAL APPENDAGE: No thrombus.   RIGHT ATRIUM: normal  AORTIC VALVE:  [place]. No vegetations. Trivial ai  MITRAL VALVE:    Normal. No vegetations. Trivial MR  TRICUSPID VALVE: Normal. No vegetations. Trivial tr  PULMONIC VALVE: Grossly normal. Trivial to mild pr. No vegetations  INTERATRIAL SEPTUM: No PFO or ASD. Agitated saline contrast was used.   PERICARDIUM: No effusion  DESCENDING AORTA:  Normal CONCLUSION: No evidence of vegetations.

## 2021-09-20 NOTE — Progress Notes (Signed)
Mobility Specialist - Progress Note   09/20/21 1000  Mobility  Activity Ambulated in hall  Level of Assistance Standby assist, set-up cues, supervision of patient - no hands on  Assistive Device Front wheel walker;None  Distance Ambulated (ft) 320 ft  Mobility Out of bed to chair with meals;Ambulated with assistance in hallway  Mobility Response Tolerated well  Mobility performed by Mobility specialist  $Mobility charge 1 Mobility    Pt ambulated in hallway with supervision. Last 45' with HHA, no LOB. No complaints. Pt left in chair with needs in reach.    Filiberto Pinks Mobility Specialist 09/20/21, 11:14 AM

## 2021-09-20 NOTE — Progress Notes (Signed)
Date of Admission:  09/17/2021     ID: Brian Dodson is a 65 y.o. male  Active Problems:   Diabetes mellitus, type II ([place])   HTN (hypertension)   CAD (coronary artery disease)   Sepsis ([place])   Anemia of chronic disease   Acute metabolic encephalopathy   AF (paroxysmal atrial fibrillation) (HCC)   AKI (acute kidney injury) ([place])   Streptococcal bacteremia    Subjective: Pt is doing okay No specific complaints Ambulatory Had TEE and it was neg  Medications:   amiodarone  200 mg Oral Daily   aspirin EC  81 mg Oral Daily   atorvastatin  10 mg Oral Daily   benztropine  1 mg Oral QHS   brimonidine  1 drop Both Eyes BID   And   timolol  1 drop Both Eyes BID   cholecalciferol  2,000 Units Oral Daily   citalopram  20 mg Oral Daily   divalproex  2,000 mg Oral QHS   donepezil  10 mg Oral QHS   ferrous sulfate  325 mg Oral Q breakfast   insulin aspart  0-15 Units Subcutaneous Q4H   latanoprost  1 drop Both Eyes QHS   melatonin  5 mg Oral QHS   multivitamin with minerals  1 tablet Oral Daily   pantoprazole  40 mg Oral BID   Ensure Max Protein  11 oz Oral BID   risperidone  4 mg Oral QHS    Objective: Vital signs in last 24 hours: Temp:  [98 F (36.7 C)-98.8 F (37.1 C)] 98.3 F (36.8 C) (11/02 1143) Pulse Rate:  [40-56] 45 (11/02 1300) Resp:  [9-18] 16 (11/02 1300) BP: (95-125)/(56-80) 122/70 (11/02 1300) SpO2:  [95 %-100 %] 100 % (11/02 1300)  PHYSICAL EXAM:  General: Alert, cooperative, no distress, oriented in person, answers questions Lungs: Clear to auscultation bilaterally. No Wheezing or Rhonchi. No rales. Heart: Regular rate and rhythm, no murmur, rub or gallop. Abdomen: Soft, non-tender,not distended. Bowel sounds normal. No masses Extremities: atraumatic, no cyanosis. No edema. No clubbing Skin: No rashes or lesions. Or bruising Lymph: Cervical, supraclavicular normal. Neurologic: Grossly non-focal  Lab Results Recent Labs    09/18/21 0936  09/19/21 0539  WBC 8.1  --   HGB 10.3*  --   HCT 30.3*  --   NA 135  --   K 3.9  --   CL 102  --   CO2 28  --   BUN 19  --   CREATININE 1.06 0.97   Liver Panel No results for input(s): PROT, ALBUMIN, AST, ALT, ALKPHOS, BILITOT, BILIDIR, IBILI in the last 72 hours. Sedimentation Rate No results for input(s): ESRSEDRATE in the last 72 hours. C-Reactive Protein No results for input(s): CRP in the last 72 hours.  Microbiology:  Studies/Results: ECHO TEE  Result Date: 09/20/2021    TRANSESOPHOGEAL ECHO REPORT   Patient Name:   Brian Dodson Date of Exam: 09/20/2021 Medical Rec #:  [id number]     Height:       74.0 in Accession #:    [id number]    Weight:       233.2 lb Date of Birth:  August 08, 1956    BSA:          2.320 m Patient Age:    29 years      BP:           112/78 mmHg Patient Gender: M  HR:           49 bpm. Exam Location:  [facility] Procedure: Transesophageal Echo, Limited Color Doppler and Saline Contrast            Bubble Study Indications:     R78.81 Bacteremia  History:         Patient has prior history of Echocardiogram examinations, most                  recent 09/18/2021. CAD, COPD; Arrythmias:Atrial Fibrillation.  Sonographer:     Humphrey Rolls Referring Phys:  [id number] Darlin Priestly FATH Diagnosing Phys: Harold Hedge MD PROCEDURE: TEE procedure time was 15 minutes. The transesophogeal probe was passed without difficulty through the esophogus of the patient. Imaged were obtained with the patient in a supine position. Sedation performed by different physician. The patient  was monitored while under deep sedation. Image quality was excellent. The patient's vital signs; including heart rate, blood pressure, and oxygen saturation; remained stable throughout the procedure. The patient developed no complications during the procedure. IMPRESSIONS  1. Left ventricular ejection fraction, by estimation, is 60 to 65%. The left ventricle has normal function. The left ventricle has no regional wall  motion abnormalities. Left ventricular diastolic function could not be evaluated.  2. Right ventricular systolic function is normal. The right ventricular size is normal.  3. No left atrial/left atrial appendage thrombus was detected.  4. The mitral valve is normal in structure. Trivial mitral valve regurgitation.  5. The aortic valve is tricuspid. Aortic valve regurgitation is trivial. Conclusion(s)/Recommendation(s): Normal biventricular function without evidence of hemodynamically significant valvular heart disease. No evidence of vegetation/infective endocarditis on this transesophageal echocardiogram. FINDINGS  Left Ventricle: Left ventricular ejection fraction, by estimation, is 60 to 65%. The left ventricle has normal function. The left ventricle has no regional wall motion abnormalities. The left ventricular internal cavity size was normal in size. There is  no left ventricular hypertrophy. Left ventricular diastolic function could not be evaluated. Right Ventricle: The right ventricular size is normal. No increase in right ventricular wall thickness. Right ventricular systolic function is normal. Left Atrium: Left atrial size was normal in size. No left atrial/left atrial appendage thrombus was detected. Right Atrium: Right atrial size was normal in size. Pericardium: There is no evidence of pericardial effusion. Mitral Valve: The mitral valve is normal in structure. Trivial mitral valve regurgitation. There is no evidence of mitral valve vegetation. Tricuspid Valve: The tricuspid valve is normal in structure. Tricuspid valve regurgitation is trivial. There is no evidence of tricuspid valve vegetation. Aortic Valve: The aortic valve is tricuspid. Aortic valve regurgitation is trivial. There is no evidence of aortic valve vegetation. Pulmonic Valve: The pulmonic valve was grossly normal. Pulmonic valve regurgitation is trivial. There is no evidence of pulmonic valve vegetation. Aorta: The aortic root is  normal in size and structure. IAS/Shunts: No atrial level shunt detected by color flow Doppler. Agitated saline contrast was given intravenously to evaluate for intracardiac shunting. There is no evidence of a patent foramen ovale. There is no evidence of an atrial septal defect. Harold Hedge MD Electronically signed by Harold Hedge MD Signature Date/Time: 09/20/2021/12:51:35 PM    Final    DG FL GUIDED LUMBAR PUNCTURE  Result Date: 09/18/2021 CLINICAL DATA:  Altered mental status with a fever and bacteremia, request received for diagnostic lumbar puncture. EXAM: DIAGNOSTIC LUMBAR PUNCTURE UNDER FLUOROSCOPIC GUIDANCE COMPARISON:  CT head performed 09/17/2021 report reviewed prior to the procedure. FLUOROSCOPY TIME:  Fluoroscopy Time:  0.2 minute Radiation Exposure Index (if provided by the fluoroscopic device): 9.6 mGy Number of Acquired Spot Images: 2 PROCEDURE: Informed consent was obtained from the patient's legal guardian prior to the procedure, including potential complications of bleeding, infection, CSF leak requiring additional procedures, paresthesias, postprocedure requirements including the need for the patient to lay flat for several hours after the procedure, headache, allergy, and pain. With the patient prone, the lower back was prepped with Betadine. 1% Lidocaine was used for local anesthesia. Lumbar puncture was performed at the L3-L4 level using a 20 gauge needle with return of colorless CSF with an opening pressure of 17 cm water. Nine ml of CSF were obtained for laboratory studies. The patient tolerated the procedure well and there were no apparent complications. A sterile bandage was applied. IMPRESSION: Technically successful fluoroscopic guided lumbar puncture. Read By: Tsosie Billing PA-C Electronically Signed   By: Delanna Ahmadi M.D.   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

## 2021-09-20 NOTE — Transfer of Care (Signed)
Immediate Anesthesia Transfer of Care Note  Patient: Brian Dodson  Procedure(s) Performed: TRANSESOPHAGEAL ECHOCARDIOGRAM (TEE)  Patient Location: Special Procedures  Anesthesia Type:General  Level of Consciousness: drowsy  Airway & Oxygen Therapy: Patient Spontanous Breathing and Patient connected to nasal cannula oxygen  Post-op Assessment: Report given to RN and Post -op Vital signs reviewed and stable  Post vital signs: Reviewed and stable  Last Vitals:  Vitals Value Taken Time  BP 95/56 09/20/21 1221  Temp    Pulse 42 09/20/21 1226  Resp 16 09/20/21 1226  SpO2 100 % 09/20/21 1226  Vitals shown include unvalidated device data.  Last Pain:  Vitals:   09/20/21 1143  TempSrc: Oral  PainSc: 0-No pain         Complications: No notable events documented.

## 2021-09-20 NOTE — Consult Note (Signed)
GI Inpatient Consult Note  Reason for Consult: Colonoscopy consult to r/o malignancy    Attending Requesting Consult: Dr. Ralene Muskrat, MD  History of Present Illness: Brian Dodson is a 65 y.o. male seen for evaluation of colonoscopy consult to rule out malignancy at the request of Dr. Priscella Mann. Pt has a PMH of HTN, paroxsymal atrial fibrillation on Eliquis at home, T2DM, CAD, GERD, HLD, Schizophrenia, and BPH. He was admitted to the hospital 10/30 for fever and altered mental status and found to have sepsis 2/2 Streptococcus group G bacteremia. Infectious disease is following patient. Source of bacteremia is unclear at this time. Patient underwent TEE today which was negative for endocarditis. He had recent hospitalization 9/3 -07/26/21 for severe sepsis 2/2 group B strep bacteremia and recent discharge from hospital on 08/18/21 for left leg cellulitis. He lives in assisted living and his legal guardian is from [employer]. Patient denies any acute complaints today. He is not able to provide much of the history and all history is obtained from the chart. He denies any abdominal pain, changes in bowel habits, diarrhea, rectal bleeding, or fecal incontinence. He has never had a colonoscopy per chart review.   Past Medical History:  Past Medical History:  Diagnosis Date   Asthma    Atrial fibrillation ([place])    Benign prostatic hypertrophy with lower urinary tract symptoms (LUTS)    Bulbous urethral stricture    CAD (coronary artery disease)    s/p coronary stent 2003   Constipation    COPD (chronic obstructive pulmonary disease) (HCC)    Diabetes mellitus without complication (HCC)    Dizziness    Dyspnea    GERD (gastroesophageal reflux disease)    Gross hematuria    Hyperlipemia    Hypertension    Lumbago    Palpitation    Schizophrenia ([place])     Problem List: Patient Active Problem List   Diagnosis Date Noted   Streptococcal bacteremia 09/18/2021   AKI (acute kidney injury)  ([place]) 09/17/2021   Cellulitis 08/15/2021   Sepsis secondary to UTI ([place]) XX123456   Acute metabolic encephalopathy XX123456   Dementia ([place]) 03/05/2021   Memory loss due to medical condition 03/05/2021   AF (paroxysmal atrial fibrillation) ([place]) 03/05/2021   Atrial flutter ([place]) 03/05/2021   Diabetes mellitus type 2, controlled, without complications ([place]) [id number]   Obese 03/05/2021   Anemia of chronic disease 03/04/2021   Acute lower UTI 03/02/2021   Acute febrile illness    Bilateral lower extremity edema 04/01/2018   Lower extremity pain, bilateral 04/01/2018   Swelling of limb 10/04/2017   Sepsis ([place]) 05/20/2017   Anemia 05/07/2017   B12 deficiency 05/07/2017   Loss of memory 05/07/2017   Atrial fibrillation with RVR ([place]) 05/23/2015   Syncope 05/23/2015   Schizophrenia ([place]) 05/23/2015   Diabetes mellitus, type II ([place]) 05/23/2015   HTN (hypertension) 05/23/2015   GERD (gastroesophageal reflux disease) 05/23/2015   CAD (coronary artery disease) 05/23/2015    Past Surgical History: Past Surgical History:  Procedure Laterality Date   COLONOSCOPY     CORONARY ANGIOPLASTY WITH STENT PLACEMENT  11/2003   kidney stent      Allergies: Allergies  Allergen Reactions   Sulfa Antibiotics Other (See Comments)    Unknown reaction   Xarelto [Rivaroxaban]     Home Medications: Medications Prior to Admission  Medication Sig Dispense Refill Last Dose   acetaminophen (TYLENOL) 500 MG tablet Take 1,000 mg by mouth every 6 (six) hours  as needed for mild pain.   09/16/2021 at 2000   amiodarone (PACERONE) 200 MG tablet Take 1 tablet (200 mg total) by mouth daily.   09/16/2021 at 0800   aspirin 81 MG tablet Take 81 mg by mouth daily.    09/16/2021 at 0800   atorvastatin (LIPITOR) 10 MG tablet Take 10 mg by mouth daily.   09/16/2021 at 0800   benztropine (COGENTIN) 1 MG tablet Take 1 mg by mouth at bedtime.    09/16/2021 at 2000   brimonidine-timolol (COMBIGAN) 0.2-0.5 %  ophthalmic solution Place 1 drop into both eyes every 12 (twelve) hours. Wait 3 to 5 minutes between drops   09/16/2021 at 2000   carvedilol (COREG) 6.25 MG tablet Take 6.25 mg by mouth 2 (two) times daily with a meal.   09/16/2021 at 1730   Cholecalciferol (EQL VITAMIN D3) 50 MCG (2000 UT) CAPS Take 1 capsule by mouth daily.   09/16/2021 at 0800   citalopram (CELEXA) 20 MG tablet Take 20 mg by mouth daily.   09/16/2021 at 0800   diclofenac Sodium (VOLTAREN) 1 % GEL Apply 2 g topically 4 (four) times daily as needed. Apply to neck and shoulders   09/16/2021 at 2000   divalproex (DEPAKOTE ER) 500 MG 24 hr tablet Take 2,000 mg by mouth at bedtime.    09/16/2021 at 2000   donepezil (ARICEPT) 10 MG tablet Take 10 mg by mouth at bedtime.    09/16/2021 at 2000   ELIQUIS 5 MG TABS tablet Take 5 mg by mouth 2 (two) times daily.   09/16/2021   ferrous sulfate 325 (65 FE) MG tablet Take 325 mg by mouth daily with breakfast.   09/16/2021 at 0730   furosemide (LASIX) 20 MG tablet Take 20 mg by mouth daily as needed. For edema (swelling)   09/16/2021 at 0800   lanolin/mineral oil (KERI/THERA-DERM) LOTN Apply 1 application topically as needed for dry skin. Apply to face after washing face daily   09/16/2021 at 0800   losartan (COZAAR) 25 MG tablet Take 1 tablet (25 mg total) by mouth daily. May increase to 2 tablets by mouth daily if blood pressure goes above 140/90 for more than a day  or if the blood pressure goes above 150/100 at all. (Patient not taking: Reported on 09/18/2021) 30 tablet 11 Not Taking   LUMIGAN 0.01 % SOLN Place 1 drop into both eyes at bedtime.   09/16/2021 at 2000   melatonin 5 MG TABS Take 5 mg by mouth at bedtime.   09/16/2021 at 2000   Menthol, Topical Analgesic, (BIOFREEZE) 4 % GEL Apply 1 application topically 3 (three) times daily. To lower back area as needed for pain   prn at prn   metFORMIN (GLUCOPHAGE) 500 MG tablet Take 500 mg by mouth 2 (two) times daily with a meal.   09/16/2021  at 1730   omeprazole (PRILOSEC) 20 MG capsule Take 20 mg by mouth 2 (two) times daily before a meal. May open and sprinkle in applesauce.   09/16/2021 at 1700   Oyster Shell (OYSTER CALCIUM) 500 MG TABS tablet Take 500 mg of elemental calcium by mouth 2 (two) times daily.   09/16/2021 at 1730   risperidone (RISPERDAL) 4 MG tablet Take 4 mg by mouth at bedtime.   09/16/2021 at 2000   vitamin C (ASCORBIC ACID) 250 MG tablet Take 1 tablet (250 mg total) by mouth daily. 30 tablet 0 09/16/2021 at 0800   Home medication reconciliation was completed  with the patient.   Scheduled Inpatient Medications:    amiodarone  200 mg Oral Daily   aspirin EC  81 mg Oral Daily   atorvastatin  10 mg Oral Daily   benztropine  1 mg Oral QHS   brimonidine  1 drop Both Eyes BID   And   timolol  1 drop Both Eyes BID   cholecalciferol  2,000 Units Oral Daily   citalopram  20 mg Oral Daily   divalproex  2,000 mg Oral QHS   donepezil  10 mg Oral QHS   ferrous sulfate  325 mg Oral Q breakfast   insulin aspart  0-15 Units Subcutaneous Q4H   latanoprost  1 drop Both Eyes QHS   melatonin  5 mg Oral QHS   multivitamin with minerals  1 tablet Oral Daily   pantoprazole  40 mg Oral BID   Ensure Max Protein  11 oz Oral BID   risperidone  4 mg Oral QHS    Continuous Inpatient Infusions:    penicillin g continuous IV infusion 12 Million Units (09/20/21 0933)    PRN Inpatient Medications:  acetaminophen **OR** acetaminophen, ondansetron **OR** ondansetron (ZOFRAN) IV  Family History: family history includes Diabetes in an other family member.  The patient's family history is negative for inflammatory bowel disorders, GI malignancy, or solid organ transplantation.  Social History:   reports that he has quit smoking. He has never used smokeless tobacco. He reports that he does not drink alcohol and does not use drugs. The patient denies ETOH, tobacco, or drug use.   Review of Systems:  Unable to obtain due to  patient's mental status  See HPI   Physical Examination: BP 122/70   Pulse (!) 45   Temp 98.3 F (36.8 C) (Oral)   Resp 16   Ht 6\' 2"  (1.88 m)   Wt 105.8 kg   SpO2 100%   BMI 29.95 kg/m  Pleasantly confused, no increased respiratory effort.  Gen: NAD, alert and oriented to self HEENT: PEERLA, EOMI, Neck: supple, no JVD or thyromegaly Chest: CTA bilaterally, no wheezes, crackles, or other adventitious sounds CV: RRR, no m/g/c/r Abd: soft, NT, ND, +BS in all four quadrants; no HSM, guarding, ridigity, or rebound tenderness Ext: no edema, well perfused with 2+ pulses, Skin: no rash or lesions noted Lymph: no LAD  Data: Lab Results  Component Value Date   WBC 8.1 09/18/2021   HGB 10.3 (L) 09/18/2021   HCT 30.3 (L) 09/18/2021   MCV 101.0 (H) 09/18/2021   PLT 104 (L) 09/18/2021   Recent Labs  Lab 09/17/21 0200 09/18/21 0936  HGB 10.3* 10.3*   Lab Results  Component Value Date   NA 135 09/18/2021   K 3.9 09/18/2021   CL 102 09/18/2021   CO2 28 09/18/2021   BUN 19 09/18/2021   CREATININE 0.97 09/19/2021   Lab Results  Component Value Date   ALT 15 09/17/2021   AST 22 09/17/2021   ALKPHOS 44 09/17/2021   BILITOT 0.7 09/17/2021   Recent Labs  Lab 09/17/21 0200 09/18/21 0936  APTT 34  --   INR 1.2 1.0   Assessment/Plan:  65 y/o AA male with a PMH of paroxsymal atrial fibrillation on Eliquis, HTN, CAD, T2DM, schizophrenia admitted to [facility] for severe sepsis secondary to Streptococcus bacteremia of unknown origin   Sepsis 2/2 Streptococcus bacteremia - source unclear, TEE negative today for endocarditis, CSF negative, ID following  Colonoscopy naive  PAF - Eliquis on hold  Schizophrenia/cognitive impairment   Recommendations:  - GI evaluation is indicated to help rule out any potential gastrointestinal source of his bacteremia. Work-up to date for source has been negative - Unable to get a hold of legal guardian to obtain consent for endoscopic  procedures. I have attempted to call phone # on file x3 with no response and says not a working #. I was able to talk to his niece Administrator).  - Colonoscopy when clinically feasible and when consent can be obtained, possibly on Friday - Clear liquid diet starting tomorrow - Continue to hold Eliquis - Following along    Thank you for the consult. Please call with questions or concerns.  Reeves Forth [facility] [phone] (Cell)

## 2021-09-20 NOTE — Progress Notes (Signed)
ID Pt doing better Walking around the bed No fever Appetite good   Objective Patient Vitals for the past 24 hrs:  BP Temp Temp src Pulse Resp SpO2  09/19/21 2021 120/79 98.8 F (37.1 C) Oral (!) 54 16 100 %  09/19/21 1555 116/80 98.2 F (36.8 C) Oral (!) 40 18 100 %  09/19/21 0738 108/81 98.5 F (36.9 C) Oral 73 18 100 %  09/19/21 0412 122/76 98.2 F (36.8 C) Oral 69 20 98 %   Awake and alert No neck stiffness Chest b/l air entry [id number] Abd soft Cns non focal  Labs CBC Latest Ref Rng & Units 09/18/2021 09/17/2021 08/16/2021  WBC 4.0 - 10.5 K/uL 8.1 10.3 6.7  Hemoglobin 13.0 - 17.0 g/dL 10.3(L) 10.3(L) 11.1(L)  Hematocrit 39.0 - 52.0 % 30.3(L) 30.6(L) 34.0(L)  Platelets 150 - 400 K/uL 104(L) 121(L) 188    CMP Latest Ref Rng & Units 09/19/2021 09/18/2021 09/17/2021  Glucose 70 - 99 mg/dL - 725(D) 664(Q)  BUN 8 - 23 mg/dL - 19 03(K)  Creatinine 0.61 - 1.24 mg/dL 7.42 5.95 6.38(V)  Sodium 135 - 145 mmol/L - 135 136  Potassium 3.5 - 5.1 mmol/L - 3.9 4.0  Chloride 98 - 111 mmol/L - 102 101  CO2 22 - 32 mmol/L - 28 26  Calcium 8.9 - 10.3 mg/dL - 5.6(E) 8.9  Total Protein 6.5 - 8.1 g/dL - - 7.1  Total Bilirubin 0.3 - 1.2 mg/dL - - 0.7  Alkaline Phos 38 - 126 U/L - - 44  AST 15 - 41 U/L - - 22  ALT 0 - 44 U/L - - 15    Micro 09/17/21 - BC streptococcus GroupG CSF Cell count only 3 ( 100% lymphocytes) Protein 54  Impression/recommendation 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

## 2021-09-20 NOTE — Progress Notes (Signed)
PROGRESS NOTE    Brian Dodson  [id number] DOB: 27-Mar-1956 DOA: 09/17/2021 PCP: [name], MD    Brief Narrative:  65 y.o. male with medical history significant for paroxysmal atrial fibrillation on Eliquis, hypertension, type II DM, CAD, cognitive impairment, schizophrenia, recent hospitalization from 07/22/2021 to 07/26/2021 for severe sepsis from group B strep bacteremia, recent discharge from the hospital on 08/18/2021 after hospitalization for left leg cellulitis.   He was brought to the hospital because of fever and altered mental status.  His temperature was 103.3 F in the emergency room.  Patient was Streptococcus group G bacteremia.  Source is unclear.  This is a recurrent event.  Patient scheduled for TEE on 11/to rule out endocarditis.  Remained stable on penicillin.  Infectious disease consulted.   Assessment & Plan:   Active Problems:   Diabetes mellitus, type II ([place])   HTN (hypertension)   CAD (coronary artery disease)   Sepsis (HCC)   Anemia of chronic disease   Acute metabolic encephalopathy   AF (paroxysmal atrial fibrillation) (HCC)   AKI (acute kidney injury) ([place])   Streptococcal bacteremia  Severe sepsis secondary to Streptococcus bacteremia Source of bacteremia is unclear This is a recurrent event TTE negative CSF negative Stable on penicillin Plan: Continue penicillin TEE today ID follow-up Monitor vitals and fever curve  Paroxysmal atrial fibrillation Eliquis temporarily on hold Can likely restart tomorrow after TEE  Schizophrenia Cognitive impairment Mental status appears to be at baseline Continue current psychiatric regimen  Acute metabolic encephalopathy Resolved  Acute kidney injury Resolved  Type 2 diabetes mellitus with hyperglycemia Blood glucose stable Continue current regimen  Hypertension BP reasonably well controlled Continue current regimen   DVT prophylaxis: SCD Code Status: Full Family Communication: None  today Disposition Plan: Status is: Inpatient  Remains inpatient appropriate because: TEE today.  May need GI evaluation to determine source of bacteremia       Level of care: Med-Surg  Consultants:  ID  Procedures:  TEE 11/2  Antimicrobials: Penicillin   Subjective: Seen and examined.  Sitting comfortably in chair.  No visible distress.  Objective: Vitals:   09/20/21 1222 09/20/21 1223 09/20/21 1224 09/20/21 1225  BP:      Pulse: (!) 48 (!) 47 (!) 45 (!) 47  Resp: (!) 9  16 15   Temp:      TempSrc:      SpO2: 95% 100% 100% 100%  Weight:      Height:        Intake/Output Summary (Last 24 hours) at 09/20/2021 1232 Last data filed at 09/20/2021 1224 Gross per 24 hour  Intake 540 ml  Output --  Net 540 ml   Filed Weights   09/17/21 0223 09/18/21 1951  Weight: 102.8 kg 105.8 kg    Examination:  General exam: No acute distress Respiratory system: Lungs clear.  Normal work of breathing.  Room air Cardiovascular system: S1-S2, RRR, no murmurs Gastrointestinal system: Soft, NT/ND, normal bowel sounds Central nervous system: Alert, oriented x1, no focal deficits Extremities: Symmetric 5 x 5 power. Skin: No rashes, lesions or ulcers Psychiatry: Judgement and insight appear impaired. Mood & affect confused.     Data Reviewed: I have personally reviewed following labs and imaging studies  CBC: Recent Labs  Lab 09/17/21 0200 09/18/21 0936  WBC 10.3 8.1  NEUTROABS 8.6* 4.7  HGB 10.3* 10.3*  HCT 30.6* 30.3*  MCV 102.3* 101.0*  PLT 121* 123456*   Basic Metabolic Panel: Recent Labs  Lab  09/17/21 0200 09/18/21 0936 09/19/21 0539  NA 136 135  --   K 4.0 3.9  --   CL 101 102  --   CO2 26 28  --   GLUCOSE 108* 105*  --   BUN 25* 19  --   CREATININE 1.50* 1.06 0.97  CALCIUM 8.9 8.4*  --    GFR: Estimated Creatinine Clearance: 97.1 mL/min (by C-G formula based on SCr of 0.97 mg/dL). Liver Function Tests: Recent Labs  Lab 09/17/21 0200  AST 22  ALT  15  ALKPHOS 44  BILITOT 0.7  PROT 7.1  ALBUMIN 3.4*   Recent Labs  Lab 09/17/21 0200  LIPASE 35   No results for input(s): AMMONIA in the last 168 hours. Coagulation Profile: Recent Labs  Lab 09/17/21 0200 09/18/21 0936  INR 1.2 1.0   Cardiac Enzymes: No results for input(s): CKTOTAL, CKMB, CKMBINDEX, TROPONINI in the last 168 hours. BNP (last 3 results) No results for input(s): PROBNP in the last 8760 hours. HbA1C: No results for input(s): HGBA1C in the last 72 hours. CBG: Recent Labs  Lab 09/19/21 1545 09/19/21 2024 09/20/21 0002 09/20/21 0441 09/20/21 0748  GLUCAP 85 196* 148* 112* 99   Lipid Profile: No results for input(s): CHOL, HDL, LDLCALC, TRIG, CHOLHDL, LDLDIRECT in the last 72 hours. Thyroid Function Tests: No results for input(s): TSH, T4TOTAL, FREET4, T3FREE, THYROIDAB in the last 72 hours. Anemia Panel: No results for input(s): VITAMINB12, FOLATE, FERRITIN, TIBC, IRON, RETICCTPCT in the last 72 hours. Sepsis Labs: Recent Labs  Lab 09/17/21 0200 09/18/21 0936  PROCALCITON 0.24 0.54  LATICACIDVEN 1.7  --     Recent Results (from the past 240 hour(s))  Blood Culture (routine x 2)     Status: Abnormal   Collection Time: 09/17/21  2:00 AM   Specimen: BLOOD  Result Value Ref Range Status   Specimen Description   Final    BLOOD RAC Performed at [facility], [address], [place], [place] [address]    Special Requests   Final    BOTTLES DRAWN AEROBIC AND ANAEROBIC Blood Culture adequate volume Performed at [facility], [address] [place], [place] [address]    Culture  Setup Time   Final    GRAM POSITIVE COCCI IN BOTH AEROBIC AND ANAEROBIC BOTTLES CRITICAL RESULT CALLED TO, READ BACK BY AND VERIFIED WITH: Lance Coon NAZARI 09/17/21 1426 AMK    Culture STREPTOCOCCUS GROUP G (A)  Final   Report Status 09/19/2021 FINAL  Final   Organism ID, Bacteria STREPTOCOCCUS GROUP G  Final      Susceptibility   Streptococcus group  g - MIC*    CLINDAMYCIN RESISTANT Resistant     AMPICILLIN <=0.25 SENSITIVE Sensitive     ERYTHROMYCIN >=8 RESISTANT Resistant     VANCOMYCIN 0.5 SENSITIVE Sensitive     CEFTRIAXONE <=0.12 SENSITIVE Sensitive     LEVOFLOXACIN 0.5 SENSITIVE Sensitive     PENICILLIN Value in next row Sensitive      SENSITIVE0.06    * STREPTOCOCCUS GROUP G  Blood Culture (routine x 2)     Status: Abnormal   Collection Time: 09/17/21  2:00 AM   Specimen: BLOOD  Result Value Ref Range Status   Specimen Description   Final    BLOOD RH Performed at [facility], [address], [place], [place] [address]    Special Requests   Final    BOTTLES DRAWN AEROBIC AND ANAEROBIC Blood Culture adequate volume Performed at [facility], [address] [place], [place] [address]    Culture  Setup Time   Final    GRAM POSITIVE COCCI IN BOTH AEROBIC AND ANAEROBIC BOTTLES CRITICAL RESULT CALLED TO, READ BACK BY AND VERIFIED WITH: WALID NAZARI 09/17/21 1426 AMK    Culture (A)  Final    STREPTOCOCCUS GROUP G SUSCEPTIBILITIES PERFORMED ON PREVIOUS CULTURE WITHIN THE LAST 5 DAYS. Performed at [facility], [address] [place], [place] [address]    Report Status 09/19/2021 FINAL  Final  Urine Culture     Status: None   Collection Time: 09/17/21  2:00 AM   Specimen: In/Out Cath Urine  Result Value Ref Range Status   Specimen Description   Final    IN/OUT CATH URINE Performed at [facility], [address], [place], [place] [address]    Special Requests   Final    NONE Performed at [facility], [address] [place], [place] [address]    Culture   Final    NO GROWTH Performed at [facility], [address], [place], [place] [address]    Report Status 09/18/2021 FINAL  Final  Resp Panel by RT-PCR (Flu A&B, Covid) Nasopharyngeal Swab     Status: None   Collection Time: 09/17/21  2:00 AM   Specimen: Nasopharyngeal Swab; Nasopharyngeal(NP) swabs in  vial transport medium  Result Value Ref Range Status   SARS Coronavirus 2 by RT PCR NEGATIVE NEGATIVE Final    Comment: (NOTE) SARS-CoV-2 target nucleic acids are NOT DETECTED.  The SARS-CoV-2 RNA is generally detectable in upper respiratory specimens during the acute phase of infection. The lowest concentration of SARS-CoV-2 viral copies this assay can detect is 138 copies/mL. A negative result does not preclude SARS-Cov-2 infection and should not be used as the sole basis for treatment or other patient management decisions. A negative result may occur with  improper specimen collection/handling, submission of specimen other than nasopharyngeal swab, presence of viral mutation(s) within the areas targeted by this assay, and inadequate number of viral copies(<138 copies/mL). A negative result must be combined with clinical observations, patient history, and epidemiological information. The expected result is Negative.  Fact Sheet for Patients:  BloggerCourse.com  Fact Sheet for Healthcare Providers:  SeriousBroker.it  This test is no t yet approved or cleared by the [place] FDA and  has been authorized for detection and/or diagnosis of SARS-CoV-2 by FDA under an Emergency Use Authorization (EUA). This EUA will remain  in effect (meaning this test can be used) for the duration of the COVID-19 declaration under Section 564(b)(1) of the Act, 21 U.S.C.section 360bbb-3(b)(1), unless the authorization is terminated  or revoked sooner.       Influenza A by PCR NEGATIVE NEGATIVE Final   Influenza B by PCR NEGATIVE NEGATIVE Final    Comment: (NOTE) The Xpert Xpress SARS-CoV-2/FLU/RSV plus assay is intended as an aid in the diagnosis of influenza from Nasopharyngeal swab specimens and should not be used as a sole basis for treatment. Nasal washings and aspirates are unacceptable for Xpert Xpress SARS-CoV-2/FLU/RSV testing.  Fact  Sheet for Patients: BloggerCourse.com  Fact Sheet for Healthcare Providers: SeriousBroker.it  This test is not yet approved or cleared by the [place] FDA and has been authorized for detection and/or diagnosis of SARS-CoV-2 by FDA under an Emergency Use Authorization (EUA). This EUA will remain in effect (meaning this test can be used) for the duration of the COVID-19 declaration under Section  564(b)(1) of the Act, 21 U.S.C. section 360bbb-3(b)(1), unless the authorization is terminated or revoked.  Performed at [facility], [address] [place], [place] [address]   Blood Culture ID Panel (Reflexed)     Status: Abnormal   Collection Time: 09/17/21  2:00 AM  Result Value Ref Range Status   Enterococcus faecalis NOT DETECTED NOT DETECTED Final   Enterococcus Faecium NOT DETECTED NOT DETECTED Final   Listeria monocytogenes NOT DETECTED NOT DETECTED Final   Staphylococcus species NOT DETECTED NOT DETECTED Final   Staphylococcus aureus (BCID) NOT DETECTED NOT DETECTED Final   Staphylococcus epidermidis NOT DETECTED NOT DETECTED Final   Staphylococcus lugdunensis NOT DETECTED NOT DETECTED Final   Streptococcus species DETECTED (A) NOT DETECTED Final    Comment: Not Enterococcus species, Streptococcus agalactiae, Streptococcus pyogenes, or Streptococcus pneumoniae. CRITICAL RESULT CALLED TO, READ BACK BY AND VERIFIED WITH: WALID NAZARI 09/17/21 1426 AMK    Streptococcus agalactiae NOT DETECTED NOT DETECTED Final   Streptococcus pneumoniae NOT DETECTED NOT DETECTED Final   Streptococcus pyogenes NOT DETECTED NOT DETECTED Final   A.calcoaceticus-baumannii NOT DETECTED NOT DETECTED Final   Bacteroides fragilis NOT DETECTED NOT DETECTED Final   Enterobacterales NOT DETECTED NOT DETECTED Final   Enterobacter cloacae complex NOT DETECTED NOT DETECTED Final   Escherichia coli NOT DETECTED NOT DETECTED Final   Klebsiella  aerogenes NOT DETECTED NOT DETECTED Final   Klebsiella oxytoca NOT DETECTED NOT DETECTED Final   Klebsiella pneumoniae NOT DETECTED NOT DETECTED Final   Proteus species NOT DETECTED NOT DETECTED Final   Salmonella species NOT DETECTED NOT DETECTED Final   Serratia marcescens NOT DETECTED NOT DETECTED Final   Haemophilus influenzae NOT DETECTED NOT DETECTED Final   Neisseria meningitidis NOT DETECTED NOT DETECTED Final   Pseudomonas aeruginosa NOT DETECTED NOT DETECTED Final   Stenotrophomonas maltophilia NOT DETECTED NOT DETECTED Final   Candida albicans NOT DETECTED NOT DETECTED Final   Candida auris NOT DETECTED NOT DETECTED Final   Candida glabrata NOT DETECTED NOT DETECTED Final   Candida krusei NOT DETECTED NOT DETECTED Final   Candida parapsilosis NOT DETECTED NOT DETECTED Final   Candida tropicalis NOT DETECTED NOT DETECTED Final   Cryptococcus neoformans/gattii NOT DETECTED NOT DETECTED Final    Comment: Performed at [facility], [address] [place], [place] [address]  CSF culture w Gram Stain     Status: None (Preliminary result)   Collection Time: 09/18/21  2:14 PM   Specimen: PATH Cytology CSF; Cerebrospinal Fluid  Result Value Ref Range Status   Specimen Description   Final    CSF Performed at [facility], [address] [place], [place] [address]    Special Requests   Final    NONE Performed at [facility], [address] [place], [place] [address]    Gram Stain   Final    WBC PRESENT, PREDOMINANTLY MONONUCLEAR NO ORGANISMS SEEN CYTOSPIN SMEAR    Culture   Final    NO GROWTH 2 DAYS Performed at [facility], [address], [place], [place] [address]    Report Status PENDING  Incomplete         Radiology Studies: ECHOCARDIOGRAM LIMITED  Result Date: 09/18/2021    ECHOCARDIOGRAM LIMITED REPORT   Patient Name:   Brian Dodson Date of Exam: 09/18/2021 Medical Rec #:  [id number]     Height:       73.0 in Accession  #:    [id number]  Weight:       226.6 lb Date of Birth:  29-Apr-1956    BSA:          2.269 m Patient Age:    1 years      BP:           113/80 mmHg Patient Gender: M             HR:           65 bpm. Exam Location:  [facility] Procedure: Limited Echo, Color Doppler and Cardiac Doppler Indications:     Bacteremia R78.81  History:         Patient has prior history of Echocardiogram examinations, most                  recent 07/23/2021. COPD, Arrythmias:Atrial Fibrillation; Risk                  Factors:Diabetes.  Sonographer:     Sherrie Sport Referring Phys:  [id number] Jennye Boroughs Diagnosing Phys: Nelva Bush MD  Sonographer Comments: Suboptimal apical window. IMPRESSIONS  1. Left ventricular ejection fraction, by estimation, is 60 to 65%. The left ventricle has normal function. The left ventricle has no regional wall motion abnormalities. There is mild left ventricular hypertrophy. Left ventricular diastolic function could not be evaluated.  2. Right ventricular systolic function was not well visualized. The right ventricular size is mildly enlarged. Tricuspid regurgitation signal is inadequate for assessing PA pressure.  3. Right atrial size was severely dilated.  4. The mitral valve is abnormal. Trivial mitral valve regurgitation. No evidence of mitral stenosis.  5. The aortic valve is tricuspid. There is mild calcification of the aortic valve. There is mild thickening of the aortic valve. Mild aortic valve sclerosis is present, with no evidence of aortic valve stenosis.  6. There is mild dilatation of the ascending aorta, measuring 38 mm. FINDINGS  Left Ventricle: Left ventricular ejection fraction, by estimation, is 60 to 65%. The left ventricle has normal function. The left ventricle has no regional wall motion abnormalities. The left ventricular internal cavity size was normal in size. There is  mild left ventricular hypertrophy. Left ventricular diastolic function could not be evaluated. Left ventricular  diastolic function could not be evaluated due to atrial fibrillation. Right Ventricle: The right ventricular size is mildly enlarged. Right ventricular systolic function was not well visualized. Tricuspid regurgitation signal is inadequate for assessing PA pressure. Left Atrium: Left atrial size was normal in size. Right Atrium: Right atrial size was severely dilated. Pericardium: There is no evidence of pericardial effusion. Mitral Valve: The mitral valve is abnormal. There is mild thickening of the mitral valve leaflet(s). Mild to moderate mitral annular calcification. Trivial mitral valve regurgitation. No evidence of mitral valve stenosis. MV peak gradient, 4.4 mmHg. The mean mitral valve gradient is 2.0 mmHg. Tricuspid Valve: The tricuspid valve is normal in structure. Tricuspid valve regurgitation is trivial. Aortic Valve: The aortic valve is tricuspid. There is mild calcification of the aortic valve. There is mild thickening of the aortic valve. Mild aortic valve sclerosis is present, with no evidence of aortic valve stenosis. Aortic valve mean gradient measures 2.0 mmHg. Aortic valve peak gradient measures 3.7 mmHg. Aortic valve area, by VTI measures 3.67 cm. Pulmonic Valve: The pulmonic valve was not well visualized. Pulmonic valve regurgitation is not visualized. No evidence of pulmonic stenosis. Aorta: The aortic root is normal in size and structure. There is mild dilatation of the ascending aorta, measuring  38 mm. Pulmonary Artery: The pulmonary artery is of normal size. Venous: The inferior vena cava was not well visualized. LEFT VENTRICLE PLAX 2D LVIDd:         4.40 cm LVIDs:         2.90 cm LV PW:         1.20 cm LV IVS:        1.05 cm LVOT diam:     2.30 cm LV SV:         62 LV SV Index:   27 LVOT Area:     4.15 cm  RIGHT VENTRICLE RV Basal diam:  4.80 cm RV S prime:     14.30 cm/s LEFT ATRIUM             Index        RIGHT ATRIUM           Index LA diam:        4.20 cm 1.85 cm/m   RA Area:      36.60 cm LA Vol (A2C):   82.2 ml 36.23 ml/m  RA Volume:   152.00 ml 66.99 ml/m LA Vol (A4C):   50.2 ml 22.13 ml/m LA Biplane Vol: 70.4 ml 31.03 ml/m  AORTIC VALVE                    PULMONIC VALVE AV Area (Vmax):    3.55 cm     PV Vmax:        0.76 m/s AV Area (Vmean):   3.26 cm     PV Vmean:       54.100 cm/s AV Area (VTI):     3.67 cm     PV VTI:         0.144 m AV Vmax:           96.10 cm/s   PV Peak grad:   2.3 mmHg AV Vmean:          68.000 cm/s  PV Mean grad:   1.0 mmHg AV VTI:            0.170 m      RVOT Peak grad: 4 mmHg AV Peak Grad:      3.7 mmHg AV Mean Grad:      2.0 mmHg LVOT Vmax:         82.20 cm/s LVOT Vmean:        53.400 cm/s LVOT VTI:          0.150 m LVOT/AV VTI ratio: 0.88  AORTA Ao Root diam: 3.70 cm MITRAL VALVE MV Area (PHT): 3.81 cm    SHUNTS MV Area VTI:   2.31 cm    Systemic VTI:  0.15 m MV Peak grad:  4.4 mmHg    Systemic Diam: 2.30 cm MV Mean grad:  2.0 mmHg    Pulmonic VTI:  0.186 m MV Vmax:       1.05 m/s MV Vmean:      66.5 cm/s MV Decel Time: 199 msec MV E velocity: 89.10 cm/s Nelva Bush MD Electronically signed by Nelva Bush MD Signature Date/Time: 09/18/2021/2:50:51 PM    Final    DG FL GUIDED LUMBAR PUNCTURE  Result Date: 09/18/2021 CLINICAL DATA:  Altered mental status with a fever and bacteremia, request received for diagnostic lumbar puncture. EXAM: DIAGNOSTIC LUMBAR PUNCTURE UNDER FLUOROSCOPIC GUIDANCE COMPARISON:  CT head performed 09/17/2021 report reviewed prior to the procedure. FLUOROSCOPY TIME:  Fluoroscopy Time:  0.2 minute Radiation Exposure Index (  if provided by the fluoroscopic device): 9.6 mGy Number of Acquired Spot Images: 2 PROCEDURE: Informed consent was obtained from the patient's legal guardian prior to the procedure, including potential complications of bleeding, infection, CSF leak requiring additional procedures, paresthesias, postprocedure requirements including the need for the patient to lay flat for several hours after the  procedure, headache, allergy, and pain. With the patient prone, the lower back was prepped with Betadine. 1% Lidocaine was used for local anesthesia. Lumbar puncture was performed at the L3-L4 level using a 20 gauge needle with return of colorless CSF with an opening pressure of 17 cm water. Nine ml of CSF were obtained for laboratory studies. The patient tolerated the procedure well and there were no apparent complications. A sterile bandage was applied. IMPRESSION: Technically successful fluoroscopic guided lumbar puncture. Read By: Tsosie Billing PA-C Electronically Signed   By: Delanna Ahmadi M.D.   On: 09/18/2021 14:24        Scheduled Meds:  [MAR Hold] amiodarone  200 mg Oral Daily   [MAR Hold] aspirin EC  81 mg Oral Daily   [MAR Hold] atorvastatin  10 mg Oral Daily   [MAR Hold] benztropine  1 mg Oral QHS   [MAR Hold] brimonidine  1 drop Both Eyes BID   And   [MAR Hold] timolol  1 drop Both Eyes BID   [MAR Hold] cholecalciferol  2,000 Units Oral Daily   [MAR Hold] citalopram  20 mg Oral Daily   [MAR Hold] divalproex  2,000 mg Oral QHS   [MAR Hold] donepezil  10 mg Oral QHS   [MAR Hold] ferrous sulfate  325 mg Oral Q breakfast   [MAR Hold] insulin aspart  0-15 Units Subcutaneous Q4H   [MAR Hold] latanoprost  1 drop Both Eyes QHS   [MAR Hold] melatonin  5 mg Oral QHS   [MAR Hold] multivitamin with minerals  1 tablet Oral Daily   [MAR Hold] pantoprazole  40 mg Oral BID   [MAR Hold] Ensure Max Protein  11 oz Oral BID   [MAR Hold] risperidone  4 mg Oral QHS   Continuous Infusions:  sodium chloride 20 mL/hr at 09/20/21 1212   [MAR Hold] penicillin g continuous IV infusion 12 Million Units (09/20/21 0933)     LOS: 3 days    Time spent: 35 minutes    Sidney Ace, MD [facility]   If 7PM-7AM, please contact night-coverage  09/20/2021, 12:32 PM

## 2021-09-20 NOTE — Anesthesia Postprocedure Evaluation (Signed)
Anesthesia Post Note  Patient: Brian Dodson  Procedure(s) Performed: TRANSESOPHAGEAL ECHOCARDIOGRAM (TEE)  Patient location during evaluation: Specials Recovery (specials recovery) Anesthesia Type: General Level of consciousness: awake and alert and oriented Pain management: pain level controlled Vital Signs Assessment: post-procedure vital signs reviewed and stable Respiratory status: spontaneous breathing, nonlabored ventilation and respiratory function stable Cardiovascular status: blood pressure returned to baseline and stable Postop Assessment: no signs of nausea or vomiting Anesthetic complications: no   No notable events documented.   Last Vitals:  Vitals:   09/20/21 1225 09/20/21 1230  BP:  (!) 103/58  Pulse: (!) 47 (!) 41  Resp: 15 15  Temp:    SpO2: 100% 100%    Last Pain:  Vitals:   09/20/21 1300  TempSrc:   PainSc: 0-No pain                 Bedie Dominey

## 2021-09-21 ENCOUNTER — Encounter: Payer: Self-pay | Admitting: Cardiology

## 2021-09-21 DIAGNOSIS — B955 Unspecified streptococcus as the cause of diseases classified elsewhere: Secondary | ICD-10-CM | POA: Diagnosis not present

## 2021-09-21 DIAGNOSIS — R7881 Bacteremia: Secondary | ICD-10-CM | POA: Diagnosis not present

## 2021-09-21 LAB — CBC WITH DIFFERENTIAL/PLATELET
Abs Immature Granulocytes: 0.15 10*3/uL — ABNORMAL HIGH (ref 0.00–0.07)
Basophils Absolute: 0 10*3/uL (ref 0.0–0.1)
Basophils Relative: 1 %
Eosinophils Absolute: 0.3 10*3/uL (ref 0.0–0.5)
Eosinophils Relative: 5 %
HCT: 28.1 % — ABNORMAL LOW (ref 39.0–52.0)
Hemoglobin: 9.1 g/dL — ABNORMAL LOW (ref 13.0–17.0)
Immature Granulocytes: 3 %
Lymphocytes Relative: 54 %
Lymphs Abs: 3.3 10*3/uL (ref 0.7–4.0)
MCH: 33.3 pg (ref 26.0–34.0)
MCHC: 32.4 g/dL (ref 30.0–36.0)
MCV: 102.9 fL — ABNORMAL HIGH (ref 80.0–100.0)
Monocytes Absolute: 0.4 10*3/uL (ref 0.1–1.0)
Monocytes Relative: 7 %
Neutro Abs: 1.8 10*3/uL (ref 1.7–7.7)
Neutrophils Relative %: 30 %
Platelets: 124 10*3/uL — ABNORMAL LOW (ref 150–400)
RBC: 2.73 MIL/uL — ABNORMAL LOW (ref 4.22–5.81)
RDW: 14.4 % (ref 11.5–15.5)
WBC: 6 10*3/uL (ref 4.0–10.5)
nRBC: 0 % (ref 0.0–0.2)

## 2021-09-21 LAB — BASIC METABOLIC PANEL
Anion gap: 6 (ref 5–15)
BUN: 16 mg/dL (ref 8–23)
CO2: 26 mmol/L (ref 22–32)
Calcium: 8.6 mg/dL — ABNORMAL LOW (ref 8.9–10.3)
Chloride: 104 mmol/L (ref 98–111)
Creatinine, Ser: 0.95 mg/dL (ref 0.61–1.24)
GFR, Estimated: >60 mL/min (ref >60)
Glucose, Bld: 99 mg/dL (ref 70–99)
Potassium: 4.6 mmol/L (ref 3.5–5.1)
Sodium: 136 mmol/L (ref 135–145)

## 2021-09-21 LAB — GLUCOSE, CAPILLARY
Glucose-Capillary: 80 mg/dL (ref 70–99)
Glucose-Capillary: 95 mg/dL (ref 70–99)
Glucose-Capillary: 97 mg/dL (ref 70–99)
Glucose-Capillary: 98 mg/dL (ref 70–99)
Glucose-Capillary: 70 mg/dL (ref 70–99)
Glucose-Capillary: 102 mg/dL — ABNORMAL HIGH (ref 70–99)
Glucose-Capillary: 100 mg/dL — ABNORMAL HIGH (ref 70–99)

## 2021-09-21 MED ORDER — PEG 3350-KCL-NA BICARB-NACL 420 G PO SOLR
4000.0000 mL | Freq: Once | ORAL | Status: AC
  Administered 2021-09-21: 4000 mL via ORAL
  Filled 2021-09-21: qty 4000

## 2021-09-21 NOTE — TOC Progression Note (Addendum)
Transition of Care [facility]) - Progression Note    Patient Details  Name: Brian Dodson MRN: [id number] Date of Birth: 1956/05/04  [facility] CM/SW Contact  [name], LCSW Phone Number: 09/21/2021, 9:12 AM  Clinical Narrative:  Per ID pharmacist, patient will require IV abx at discharge. Left voicemail for legal guardian. Will discuss SNF placement when she calls back. PT/OT evals pending.   12:21 pm: Received call back from guardian. She is agreeable to SNF for IV abx and will speak to their social workers about facility preference. PASARR under manual review.  12:34 pm: Uploaded requested clinicals into [place] [facility] for PASARR review.  3:26 pm: PASARR under level 2 review. No bed offers so far. Received call from DSS supervisor, [name]. No SNF preference. She is agreeable to out of county placement as well if needed. Extended search to [place] and [place].    Expected Discharge Plan: Assisted Living Barriers to Discharge: Continued Medical Work up  Expected Discharge Plan and Services Expected Discharge Plan: Assisted Living   Discharge Planning Services: CM Consult Post Acute Care Choice: Resumption of Svcs/PTA Provider Living arrangements for the past 2 months: Assisted Living Facility                 DME Arranged: N/A DME Agency: NA       HH Arranged: NA           Social Determinants of Health (SDOH) Interventions    Readmission Risk Interventions Readmission Risk Prevention Plan 09/19/2021  Transportation Screening Complete  PCP or Specialist Appt within 3-5 Days Complete  HRI or Home Care Consult Complete  Social Work Consult for Recovery Care Planning/Counseling Complete  Palliative Care Screening Not Applicable  Medication Review Oceanographer) Complete  Some recent data might be hidden

## 2021-09-21 NOTE — Progress Notes (Signed)
Mobility Specialist - Progress Note   09/21/21 1300  Mobility  Activity Ambulated in hall  Level of Assistance Standby assist, set-up cues, supervision of patient - no hands on  Assistive Device Other (Comment) (IV Pole)  Distance Ambulated (ft) 320 ft  Mobility Ambulated with assistance in hallway  Mobility Response Tolerated well  Mobility performed by Mobility specialist  $Mobility charge 1 Mobility    Ambulated in hallway pushing IV pole.   Brian Dodson Mobility Specialist 09/21/21, 1:52 PM

## 2021-09-21 NOTE — Progress Notes (Signed)
Mobility Specialist - Progress Note   09/21/21 1100  Mobility  Activity Ambulated in hall;Ambulated to bathroom  Range of Motion/Exercises Active  Level of Assistance Standby assist, set-up cues, supervision of patient - no hands on  Assistive Device Front wheel walker;None  Distance Ambulated (ft) 190 ft  Mobility Out of bed for toileting;Out of bed to chair with meals;Ambulated with assistance in hallway  Mobility Response Tolerated well  Mobility performed by Mobility specialist  $Mobility charge 1 Mobility    Pt ambulated in hallway with supervision. Requesting to shower upon return to room, mobility assisted pt with setup. VC for hand placement to stand from [facility]. Pt able to doff/don socks in figure 4 position. Pt returned to recliner with alarm set.    Filiberto Pinks Mobility Specialist 09/21/21, 11:24 AM

## 2021-09-21 NOTE — NC FL2 (Addendum)
[place] MEDICAID FL2 LEVEL OF CARE SCREENING TOOL     IDENTIFICATION  Patient Name: Brian Dodson Birthdate: 16-Oct-1956 Sex: male Admission Date (Current Location): 09/17/2021  [facility] and [place] Number:  Chiropodist and Address:  [facility], [address] [place], [place] [address]      Provider Number: [id number]  Attending Physician Name and Address:  Tresa Moore, MD  Relative Name and Phone Number:       Current Level of Care: Hospital Recommended Level of Care: Skilled Nursing Facility Prior Approval Number:    Date Approved/Denied:   PASRR Number:  Manual review  Discharge Plan: SNF    Current Diagnoses: Patient Active Problem List   Diagnosis Date Noted   Streptococcal bacteremia 09/18/2021   AKI (acute kidney injury) (HCC) 09/17/2021   Cellulitis 08/15/2021   Sepsis secondary to UTI (HCC) 03/05/2021   Acute metabolic encephalopathy 03/05/2021   Dementia (HCC) 03/05/2021   Memory loss due to medical condition 03/05/2021   AF (paroxysmal atrial fibrillation) (HCC) 03/05/2021   Atrial flutter (HCC) 03/05/2021   Diabetes mellitus type 2, controlled, without complications (HCC) 03/05/2021   Obese 03/05/2021   Anemia of chronic disease 03/04/2021   Acute lower UTI 03/02/2021   Acute febrile illness    Bilateral lower extremity edema 04/01/2018   Lower extremity pain, bilateral 04/01/2018   Swelling of limb 10/04/2017   Sepsis (HCC) 05/20/2017   Anemia 05/07/2017   B12 deficiency 05/07/2017   Loss of memory 05/07/2017   Atrial fibrillation with RVR (HCC) 05/23/2015   Syncope 05/23/2015   Schizophrenia (HCC) 05/23/2015   Diabetes mellitus, type II (HCC) 05/23/2015   HTN (hypertension) 05/23/2015   GERD (gastroesophageal reflux disease) 05/23/2015   CAD (coronary artery disease) 05/23/2015    Orientation RESPIRATION BLADDER Height & Weight     Self, Place  Normal Continent Weight: 233 lb 4 oz (105.8  kg) Height:  6\' 2"  (188 cm)  BEHAVIORAL SYMPTOMS/MOOD NEUROLOGICAL BOWEL NUTRITION STATUS   (None)  (Dementia) Continent Diet  AMBULATORY STATUS COMMUNICATION OF NEEDS Skin   Supervision Verbally Other (Comment) )                       Personal Care Assistance Level of Assistance              Functional Limitations Info  Sight, Hearing, Speech Sight Info: Adequate Hearing Info: Adequate Speech Info: Adequate    SPECIAL CARE FACTORS FREQUENCY                       Contractures Contractures Info: Not present    Additional Factors Info  Code Status, Allergies, Psychotropic Code Status Info: Full code Allergies Info: Sulfa Antibiotics, Xarelto (Rivaroxaban) Psychotropic Info: Schizophrenia         Current Medications (09/21/2021):  This is the current hospital active medication list Current Facility-Administered Medications  Medication Dose Route Frequency Provider Last Rate Last Admin   acetaminophen (TYLENOL) tablet 650 mg  650 mg Oral Q6H PRN 13/01/2021, MD   650 mg at 09/17/21 0805   Or   acetaminophen (TYLENOL) suppository 650 mg  650 mg Rectal Q6H PRN 09/19/21, MD       amiodarone (PACERONE) tablet 200 mg  200 mg Oral Daily Andris Baumann, MD   200 mg at 09/21/21 0932   aspirin EC tablet 81 mg  81 mg Oral Daily 13/03/22, MD  81 mg at 09/21/21 0933   atorvastatin (LIPITOR) tablet 10 mg  10 mg Oral Daily Lurene Shadow, MD   10 mg at 09/21/21 0932   benztropine (COGENTIN) tablet 1 mg  1 mg Oral QHS Lurene Shadow, MD   1 mg at 09/20/21 2111   brimonidine (ALPHAGAN) 0.2 % ophthalmic solution 1 drop  1 drop Both Eyes BID Lurene Shadow, MD   1 drop at 09/21/21 0932   And   timolol (TIMOPTIC) 0.5 % ophthalmic solution 1 drop  1 drop Both Eyes BID Lurene Shadow, MD   1 drop at 09/21/21 0932   cholecalciferol (VITAMIN D) tablet 2,000 Units  2,000 Units Oral Daily Lurene Shadow, MD   2,000 Units at 09/21/21 0932   citalopram (CELEXA)  tablet 20 mg  20 mg Oral Daily Lurene Shadow, MD   20 mg at 09/21/21 0932   divalproex (DEPAKOTE ER) 24 hr tablet 2,000 mg  2,000 mg Oral QHS Lurene Shadow, MD   2,000 mg at 09/20/21 2113   donepezil (ARICEPT) tablet 10 mg  10 mg Oral QHS Lurene Shadow, MD   10 mg at 09/20/21 2112   ferrous sulfate tablet 325 mg  325 mg Oral Q breakfast Lurene Shadow, MD   325 mg at 09/21/21 0932   insulin aspart (novoLOG) injection 0-15 Units  0-15 Units Subcutaneous Q4H Andris Baumann, MD   2 Units at 09/20/21 2120   latanoprost (XALATAN) 0.005 % ophthalmic solution 1 drop  1 drop Both Eyes QHS Lurene Shadow, MD   1 drop at 09/20/21 2120   melatonin tablet 5 mg  5 mg Oral QHS Lurene Shadow, MD   5 mg at 09/20/21 2111   multivitamin with minerals tablet 1 tablet  1 tablet Oral Daily Lurene Shadow, MD   1 tablet at 09/21/21 0932   ondansetron (ZOFRAN) tablet 4 mg  4 mg Oral Q6H PRN Andris Baumann, MD       Or   ondansetron [facility]) injection 4 mg  4 mg Intravenous Q6H PRN Andris Baumann, MD       pantoprazole (PROTONIX) EC tablet 40 mg  40 mg Oral BID Lurene Shadow, MD   40 mg at 09/21/21 0932   penicillin G potassium 12 Million Units in dextrose 5 % 500 mL continuous infusion  12 Million Units Intravenous Q12H Lynn Ito, MD 41.7 mL/hr at 09/21/21 0936 12 Million Units at 09/21/21 0936   protein supplement (ENSURE MAX) liquid  11 oz Oral BID Lurene Shadow, MD   11 oz at 09/21/21 0936   risperiDONE (RISPERDAL) tablet 4 mg  4 mg Oral QHS Lurene Shadow, MD   4 mg at 09/20/21 2112     Discharge Medications: Please see discharge summary for a list of discharge medications.  Relevant Imaging Results:  Relevant Lab Results:   Additional Information SS#: [id number]. From [facility]. Will discharge on 4-6 weeks IV abx (more than likely Ceftriaxone). ALF said he needs SNF for IV abx.  [name], LCSW

## 2021-09-21 NOTE — Progress Notes (Addendum)
[facility] Gastroenterology Inpatient Progress Note  Subjective: Patient seen for follow up and setting up evaluation for Strep G bacteremia. Patient arousable, NAD but disoriented. No complaints.  Objective: Vital signs in last 24 hours: Temp:  [97.7 F (36.5 C)-98.8 F (37.1 C)] 97.8 F (36.6 C) (11/03 0811) Pulse Rate:  [40-82] 47 (11/03 0956) Resp:  [16-18] 16 (11/03 0811) BP: (113-143)/(72-94) 125/82 (11/03 0811) SpO2:  [99 %-100 %] 100 % (11/03 0956) Blood pressure 125/82, pulse (!) 47, temperature 97.8 F (36.6 C), temperature source Oral, resp. rate 16, height 6\' 2"  (1.88 m), weight 105.8 kg, SpO2 100 %.    Intake/Output from previous day: 11/02 0701 - 11/03 0700 In: 2330 [P.O.:1850; I.V.:200; IV Piggyback:280] Out: -   Intake/Output this shift: Total I/O In: 600 [P.O.:600] Out: 250 [Urine:250]   Gen: NAD. Appears comfortable.  HEENT: [place]/AT. PERRLA. Normal external ear exam.  Chest: CTA, no wheezes.  CV: RR nl S1, S2. No gallops.  Abd: soft, nt, nd. BS+  Ext: no edema. Pulses 2+  Neuro: Pleasantly confused   Lab Results: Results for orders placed or performed during the hospital encounter of 09/17/21 (from the past 24 hour(s))  Glucose, capillary     Status: Abnormal   Collection Time: 09/20/21  4:56 PM  Result Value Ref Range   Glucose-Capillary 175 (H) 70 - 99 mg/dL   Comment 1 Notify RN    Comment 2 Document in Chart   Glucose, capillary     Status: Abnormal   Collection Time: 09/20/21  7:59 PM  Result Value Ref Range   Glucose-Capillary 142 (H) 70 - 99 mg/dL  Glucose, capillary     Status: Abnormal   Collection Time: 09/20/21 11:53 PM  Result Value Ref Range   Glucose-Capillary 139 (H) 70 - 99 mg/dL  Glucose, capillary     Status: None   Collection Time: 09/21/21  4:13 AM  Result Value Ref Range   Glucose-Capillary 95 70 - 99 mg/dL  CBC with Differential/Platelet     Status: Abnormal   Collection Time: 09/21/21  7:36 AM  Result Value  Ref Range   WBC 6.0 4.0 - 10.5 K/uL   RBC 2.73 (L) 4.22 - 5.81 MIL/uL   Hemoglobin 9.1 (L) 13.0 - 17.0 g/dL   HCT 13/03/22 (L) 69.6 - 78.9 %   MCV 102.9 (H) 80.0 - 100.0 fL   MCH 33.3 26.0 - 34.0 pg   MCHC 32.4 30.0 - 36.0 g/dL   RDW 38.1 01.7 - 51.0 %   Platelets 124 (L) 150 - 400 K/uL   nRBC 0.0 0.0 - 0.2 %   Neutrophils Relative % 30 %   Neutro Abs 1.8 1.7 - 7.7 K/uL   Lymphocytes Relative 54 %   Lymphs Abs 3.3 0.7 - 4.0 K/uL   Monocytes Relative 7 %   Monocytes Absolute 0.4 0.1 - 1.0 K/uL   Eosinophils Relative 5 %   Eosinophils Absolute 0.3 0.0 - 0.5 K/uL   Basophils Relative 1 %   Basophils Absolute 0.0 0.0 - 0.1 K/uL   Immature Granulocytes 3 %   Abs Immature Granulocytes 0.15 (H) 0.00 - 0.07 K/uL  Basic metabolic panel     Status: Abnormal   Collection Time: 09/21/21  7:36 AM  Result Value Ref Range   Sodium 136 135 - 145 mmol/L   Potassium 4.6 3.5 - 5.1 mmol/L   Chloride 104 98 - 111 mmol/L   CO2 26 22 - 32 mmol/L   Glucose,  Bld 99 70 - 99 mg/dL   BUN 16 8 - 23 mg/dL   Creatinine, Ser 8.34 0.61 - 1.24 mg/dL   Calcium 8.6 (L) 8.9 - 10.3 mg/dL   GFR, Estimated >19 >62 mL/min   Anion gap 6 5 - 15  Glucose, capillary     Status: None   Collection Time: 09/21/21  8:10 AM  Result Value Ref Range   Glucose-Capillary 97 70 - 99 mg/dL  Glucose, capillary     Status: None   Collection Time: 09/21/21 11:44 AM  Result Value Ref Range   Glucose-Capillary 98 70 - 99 mg/dL     Recent Labs    22/97/98 0736  WBC 6.0  HGB 9.1*  HCT 28.1*  PLT 124*   BMET Recent Labs    09/19/21 0539 09/21/21 0736  NA  --  136  K  --  4.6  CL  --  104  CO2  --  26  GLUCOSE  --  99  BUN  --  16  CREATININE 0.97 0.95  CALCIUM  --  8.6*   LFT No results for input(s): PROT, ALBUMIN, AST, ALT, ALKPHOS, BILITOT, BILIDIR, IBILI in the last 72 hours. PT/INR No results for input(s): LABPROT, INR in the last 72 hours. Hepatitis Panel No results for input(s): HEPBSAG, HCVAB, HEPAIGM,  HEPBIGM in the last 72 hours. C-Diff No results for input(s): CDIFFTOX in the last 72 hours. No results for input(s): CDIFFPCR in the last 72 hours.   Studies/Results: ECHO TEE  Result Date: 09/20/2021    TRANSESOPHOGEAL ECHO REPORT   Patient Name:   AZRAEL MADDIX Date of Exam: 09/20/2021 Medical Rec #:  [id number]     Height:       74.0 in Accession #:    [id number]    Weight:       233.2 lb Date of Birth:  04/24/1956    BSA:          2.320 m Patient Age:    65 years      BP:           112/78 mmHg Patient Gender: M             HR:           49 bpm. Exam Location:  [facility] Procedure: Transesophageal Echo, Limited Color Doppler and Saline Contrast            Bubble Study Indications:     R78.81 Bacteremia  History:         Patient has prior history of Echocardiogram examinations, most                  recent 09/18/2021. CAD, COPD; Arrythmias:Atrial Fibrillation.  Sonographer:     Humphrey Rolls Referring Phys:  [id number] Darlin Priestly FATH Diagnosing Phys: Harold Hedge MD PROCEDURE: TEE procedure time was 15 minutes. The transesophogeal probe was passed without difficulty through the esophogus of the patient. Imaged were obtained with the patient in a supine position. Sedation performed by different physician. The patient  was monitored while under deep sedation. Image quality was excellent. The patient's vital signs; including heart rate, blood pressure, and oxygen saturation; remained stable throughout the procedure. The patient developed no complications during the procedure. IMPRESSIONS  1. Left ventricular ejection fraction, by estimation, is 60 to 65%. The left ventricle has normal function. The left ventricle has no regional wall motion abnormalities. Left ventricular diastolic function could not be evaluated.  2. Right ventricular  systolic function is normal. The right ventricular size is normal.  3. No left atrial/left atrial appendage thrombus was detected.  4. The mitral valve is normal in structure. Trivial  mitral valve regurgitation.  5. The aortic valve is tricuspid. Aortic valve regurgitation is trivial. Conclusion(s)/Recommendation(s): Normal biventricular function without evidence of hemodynamically significant valvular heart disease. No evidence of vegetation/infective endocarditis on this transesophageal echocardiogram. FINDINGS  Left Ventricle: Left ventricular ejection fraction, by estimation, is 60 to 65%. The left ventricle has normal function. The left ventricle has no regional wall motion abnormalities. The left ventricular internal cavity size was normal in size. There is  no left ventricular hypertrophy. Left ventricular diastolic function could not be evaluated. Right Ventricle: The right ventricular size is normal. No increase in right ventricular wall thickness. Right ventricular systolic function is normal. Left Atrium: Left atrial size was normal in size. No left atrial/left atrial appendage thrombus was detected. Right Atrium: Right atrial size was normal in size. Pericardium: There is no evidence of pericardial effusion. Mitral Valve: The mitral valve is normal in structure. Trivial mitral valve regurgitation. There is no evidence of mitral valve vegetation. Tricuspid Valve: The tricuspid valve is normal in structure. Tricuspid valve regurgitation is trivial. There is no evidence of tricuspid valve vegetation. Aortic Valve: The aortic valve is tricuspid. Aortic valve regurgitation is trivial. There is no evidence of aortic valve vegetation. Pulmonic Valve: The pulmonic valve was grossly normal. Pulmonic valve regurgitation is trivial. There is no evidence of pulmonic valve vegetation. Aorta: The aortic root is normal in size and structure. IAS/Shunts: No atrial level shunt detected by color flow Doppler. Agitated saline contrast was given intravenously to evaluate for intracardiac shunting. There is no evidence of a patent foramen ovale. There is no evidence of an atrial septal defect. Harold Hedge MD Electronically signed by Harold Hedge MD Signature Date/Time: 09/20/2021/12:51:35 PM    Final     Scheduled Inpatient Medications:    amiodarone  200 mg Oral Daily   aspirin EC  81 mg Oral Daily   atorvastatin  10 mg Oral Daily   benztropine  1 mg Oral QHS   brimonidine  1 drop Both Eyes BID   And   timolol  1 drop Both Eyes BID   cholecalciferol  2,000 Units Oral Daily   citalopram  20 mg Oral Daily   divalproex  2,000 mg Oral QHS   donepezil  10 mg Oral QHS   ferrous sulfate  325 mg Oral Q breakfast   insulin aspart  0-15 Units Subcutaneous Q4H   latanoprost  1 drop Both Eyes QHS   melatonin  5 mg Oral QHS   multivitamin with minerals  1 tablet Oral Daily   pantoprazole  40 mg Oral BID   Ensure Max Protein  11 oz Oral BID   risperidone  4 mg Oral QHS    Continuous Inpatient Infusions:    penicillin g continuous IV infusion 12 Million Units (09/21/21 0936)    PRN Inpatient Medications:  acetaminophen **OR** acetaminophen, ondansetron **OR** ondansetron (ZOFRAN) IV  Miscellaneous: N/A  Assessment:  65 y/o AA male with a PMH of paroxsymal atrial fibrillation on Eliquis, HTN, CAD, T2DM, schizophrenia admitted to [facility] for severe sepsis secondary to Streptococcus bacteremia of unknown origin    Sepsis 2/2 Streptococcus bacteremia - source unclear, TEE negative today for endocarditis, CSF negative, ID following. GI source not yet ruled out   Colonoscopy naive   PAF - Eliquis on hold  Schizophrenia/cognitive impairment     Plan:  I was able to contact Ms Lenn Sink of the department of social services, the patient's legal guardian as he is a ward of the state.The patient's representative (Ms. Abran Cantor) understands the nature of the planned procedure, indications, risks, alternatives and potential complications including but not limited to bleeding, infection, perforation, damage to internal organs and possible oversedation/side effects from anesthesia. Candice  Gobble agrees and gives consent to proceed with the recommended procedure on the patient.  Please refer to procedure notes for findings, recommendations and patient disposition/instructions. . Plan colonoscopy. Prep has been ordered.. Further recommendations to follow.  Dona Walby K. Norma Fredrickson, M.D. 09/21/2021, 1:52 PM

## 2021-09-21 NOTE — Evaluation (Signed)
Occupational Therapy Evaluation Patient Details Name: Brian Dodson MRN: [id number] DOB: 12/10/1955 Today's Date: 09/21/2021   History of Present Illness Pt admitted to [facility] on 09/17/21 from [facility] for fever and AMS, found to have sepsis secondary to steptococcus group G bacteremia. PMH significant for Afib on Eliquis, HTN, T2DM, CAD, GERD, HLD, schizophrenia, BPH, and vascular dementia.   Clinical Impression   Pt admitted with fever, sepsis.  Resides at ALF and previously distant supv-modified indep with self care.  Pt pleasant and cooperative throughout OT eval.  Able to manage basic self care with set up-distant supv, intermittent use of walker during OT session, but defer to PT for amb recommendation (pt ok without walker, per PT eval).   Pt able to have BM at bathroom toilet with 3in1 placed over commode.  Good tolerance to stand at sink to complete partial UB bath.  Pt presents with good UB strength, no reports of pain.  Set up to perform LB dressing while seated.  Recommend supv/set up for ADLs during hospitalization in order to safely manage around IV pole/IV in L arm.  No apparent functional decline.  No further OT services recommended at this time unless status changes during hospital stay.        Recommendations for follow up therapy are one component of a multi-disciplinary discharge planning process, led by the attending physician.  Recommendations may be updated based on patient status, additional functional criteria and insurance authorization.   Follow Up Recommendations  No OT follow up    Assistance Recommended at Discharge Intermittent Supervision/Assistance  Functional Status Assessment  Patient has not had a recent decline in their functional status                Precautions / Restrictions Precautions Precautions: Fall Restrictions Weight Bearing Restrictions: No      Mobility Bed Mobility               General bed mobility comments: pt  seated in recliner upon entry Patient Response: Cooperative  Transfers Overall transfer level: Needs assistance Equipment used: Rolling walker (2 wheels) Transfers: Sit to/from Stand Sit to Stand: Supervision           General transfer comment: set up for walker safety to stand from recliner and BSC; increased time/effort for transition of hands off arm rests in standing      Balance Overall balance assessment: Needs assistance Sitting-balance support: No upper extremity supported;Feet supported Sitting balance-Leahy Scale: Normal Sitting balance - Comments: able to reach forward, toward feet, cross legs without LOB or UB support   Standing balance support: During functional activity;No upper extremity supported Standing balance-Leahy Scale: Fair Standing balance comment: forward flexed posture at sink to wash face and head                           ADL either performed or assessed with clinical judgement   ADL Overall ADL's : At baseline                                       General ADL Comments: supv to manage IV pole     Vision Patient Visual Report: No change from baseline       Perception     Praxis      Pertinent Vitals/Pain Pain Assessment: No/denies pain     Hand Dominance  Right   Extremity/Trunk Assessment Upper Extremity Assessment Upper Extremity Assessment: Overall WFL for tasks assessed   Lower Extremity Assessment Lower Extremity Assessment: Defer to PT evaluation   Cervical / Trunk Assessment Cervical / Trunk Assessment: Normal   Communication Communication Communication: No difficulties   Cognition Arousal/Alertness: Awake/alert Behavior During Therapy: WFL for tasks assessed/performed Overall Cognitive Status: History of cognitive impairments - at baseline                                 General Comments: A&O x3 (person, place and month); unable to state situation, or year. Pt with hx of  cognitive deficits at baseline. Able to follow 100% of simple 1-step commands.     General Comments  pt chronically bradycardic per SN report.  HR remained in the 40s during session.    Exercises     Shoulder Instructions      Home Living Family/patient expects to be discharged to:: Assisted living                             Home Equipment: None   Additional Comments: handicapped bathroom accessibility within ALF setting      Prior Functioning/Environment Prior Level of Function : Independent/Modified Independent             Mobility Comments: Questionable historian of health due to baseline cognitive deficit. Reports being Ind with functional mobility without assist/AD. ADLs Comments: distant supv/modified indep with ADLs                                                         AM-PAC OT "6 Clicks" Daily Activity     Outcome Measure Help from another person eating meals?: None Help from another person taking care of personal grooming?: None Help from another person toileting, which includes using toliet, bedpan, or urinal?: None Help from another person bathing (including washing, rinsing, drying)?: None Help from another person to put on and taking off regular upper body clothing?: None Help from another person to put on and taking off regular lower body clothing?: None 6 Click Score: 24   End of Session Equipment Utilized During Treatment: Gait belt;Rolling walker (2 wheels)  Activity Tolerance: Patient tolerated treatment well Patient left: in chair;with call bell/phone within reach;with chair alarm set  OT Visit Diagnosis: Other abnormalities of gait and mobility (R26.89)                Time: 9024-0973 OT Time Calculation (min): 29 min Charges:  OT General Charges $OT Visit: 1 Visit OT Evaluation $OT Eval Low Complexity: 1 Low OT Treatments $Self Care/Home Management : 8-22 mins  Danelle Earthly, MS, OTR/L   [name] 09/21/2021, 4:26 PM

## 2021-09-21 NOTE — Evaluation (Signed)
Physical Therapy Evaluation Patient Details Name: Brian Dodson MRN: [id number] DOB: 04/09/1956 Today's Date: 09/21/2021  History of Present Illness  Pt admitted to [facility] on 09/17/21 from [facility] [place] [facility] for fever and AMS, found to have sepsis secondary to steptococcus group G bacteremia. PMH significant for Afib on Eliquis, HTN, T2DM, CAD, GERD, HLD, schizophrenia, BPH, and vascular dementia.   Clinical Impression  Pt is a 65 year old M admitted to hospital on 10/30 for AMS and fever. Pt questionable historian of health due to baseline cognitive deficit. At baseline, pt was Ind with all ADL's and functional mobility without AD; pt resides at [facility]. Pt presents with fair dynamic standing balance, decreased activity tolerance, baseline confusion, and adequate strength. Due to deficits, pt required supervision for transfers and CGA-supervision for gait without AD. Pt notes that gross functional mobility is at baseline with the exception of minimal deficits in standing balance. Balance deficits expected to improve with continued upright mobility/gait while hospitalized. Would recommend ambulating 3-4x/day with mobility specialist/nursing for improved c/o decreased endurance and upright balance. Increased time/effort required during session for RN administration of medication/IV and pt toileting. Due to pt being at baseline, he is not an appropriate candidate for skilled acute PT services; please reconsult with any changes in status. At this time, PT recommends return to ALF with PRN assist. Pt agreeable.        Recommendations for follow up therapy are one component of a multi-disciplinary discharge planning process, led by the attending physician.  Recommendations may be updated based on patient status, additional functional criteria and insurance authorization.  Follow Up Recommendations No PT follow up    Assistance Recommended at Discharge PRN  Functional Status Assessment Patient has not  had a recent decline in their functional status  Equipment Recommendations  None recommended by PT    Recommendations for Other Services       Precautions / Restrictions Precautions Precautions: Fall Restrictions Weight Bearing Restrictions: No      Mobility  Bed Mobility               General bed mobility comments: pt seated in recliner upon entry    Transfers Overall transfer level: Needs assistance Equipment used: None Transfers: Sit to/from Stand Sit to Stand: Supervision           General transfer comment: for safety to stand from recliner and BSC without AD and use of BUE for support; increased time/effort for transition of hands off arm rests in standing    Ambulation/Gait Ambulation/Gait assistance: Min guard;Supervision Gait Distance (Feet): 160 Feet Assistive device: None   Gait velocity: decreased   General Gait Details: intermittent assist ranging from CGA-supervision for safety. Demonstrates slowed cadence, forward flexed posture, limited hip ext, decreased step length L>R, decreased foot clearance bil, and reciprocal gait. Notes that overall functional mobility is at baseline, but balance feels "a little off"     Balance Overall balance assessment: Needs assistance Sitting-balance support: No upper extremity supported;Feet supported Sitting balance-Leahy Scale: Normal     Standing balance support: During functional activity;No upper extremity supported Standing balance-Leahy Scale: Fair Standing balance comment: slowed cadence with forward flexed posture                             Pertinent Vitals/Pain Pain Assessment: No/denies pain    Home Living Family/patient expects to be discharged to:: Assisted living (Per chart review, pt from [facility])  Home Equipment: None      Prior Function Prior Level of Function : Independent/Modified Independent             Mobility Comments: Questionable  historian of health due to baseline cognitive deficit. Reports being Ind with all ADL's and functional mobility without assist/AD.       Hand Dominance   Dominant Hand: Right    Extremity/Trunk Assessment   Upper Extremity Assessment Upper Extremity Assessment: Overall WFL for tasks assessed (Grossly 5/5; sensation intact; mild impairments in opposition coordination bil)    Lower Extremity Assessment Lower Extremity Assessment: Overall WFL for tasks assessed (grossly 5/5 with hip flexion at 4+/5; sensation intact; coordination intact bil)    Cervical / Trunk Assessment Cervical / Trunk Assessment: Normal  Communication   Communication: No difficulties  Cognition Arousal/Alertness: Awake/alert Behavior During Therapy: WFL for tasks assessed/performed Overall Cognitive Status: History of cognitive impairments - at baseline                                 General Comments: A&O x2 (person and month); unable to state situation, place, or year. Pt with hx of cognitive deficits at baseline. Able to follow 100% of simple 1-step commands.        General Comments General comments (skin integrity, edema, etc.): HR in 40's during session with RN noting pt is chronically bradycardic. Scab noted to L knee.    Exercises Other Exercises Other Exercises: Participated in transfers, gait, and toileting with grossly supervision for safety. Notes that functional mobility is grossly at baseline, with balance being "a little off" Other Exercises: Pt educated re: PT role/POC, DC recommendations, ambulating with nursing/mobility specialist 3-4x/day. He verbalized understanding   Assessment/Plan    PT Assessment Patient does not need any further PT services         PT Goals (Current goals can be found in the Care Plan section)  Acute Rehab PT Goals Patient Stated Goal: "to get balance beter" PT Goal Formulation: With patient Time For Goal Achievement: 10/05/21 Potential to  Achieve Goals: Good     AM-PAC PT "6 Clicks" Mobility  Outcome Measure Help needed turning from your back to your side while in a flat bed without using bedrails?: None Help needed moving from lying on your back to sitting on the side of a flat bed without using bedrails?: A Little Help needed moving to and from a bed to a chair (including a wheelchair)?: None Help needed standing up from a chair using your arms (e.g., wheelchair or bedside chair)?: A Little Help needed to walk in hospital room?: A Little Help needed climbing 3-5 steps with a railing? : A Little 6 Click Score: 20    End of Session Equipment Utilized During Treatment: Gait belt Activity Tolerance: Patient tolerated treatment well Patient left: in chair;with call bell/phone within reach;with chair alarm set Nurse Communication: Mobility status PT Visit Diagnosis: Unsteadiness on feet (R26.81)    Time: 4970-2637 PT Time Calculation (min) (ACUTE ONLY): 31 min   Charges:   PT Evaluation $PT Eval Low Complexity: 1 Low PT Treatments $Therapeutic Activity: 8-22 mins       Vira Blanco, PT, DPT 10:09 AM,09/21/21

## 2021-09-21 NOTE — Progress Notes (Signed)
PROGRESS NOTE    Brian Dodson  QNV:[id number] DOB: [birth year] DOA: 09/17/2021 PCP: Galvin Proffer, MD    Brief Narrative:  65 y.o. male with medical history significant for paroxysmal atrial fibrillation on Eliquis, hypertension, type II DM, CAD, cognitive impairment, schizophrenia, recent hospitalization from 07/22/2021 to 07/26/2021 for severe sepsis from group B strep bacteremia, recent discharge from the hospital on 08/18/2021 after hospitalization for left leg cellulitis.   He was brought to the hospital because of fever and altered mental status.  His temperature was 103.3 F in the emergency room.  Patient was Streptococcus group G bacteremia.  Source is unclear.  This is a recurrent event.  Status post TEE on 11/2 negative for endocarditis.  Source remains unclear.  Consideration for GI source.  GI consulted and willing to do colonoscopy however as of this time we are unable to reach patient's legal guardian for consent.   Assessment & Plan:   Active Problems:   Diabetes mellitus, type II (HCC)   HTN (hypertension)   CAD (coronary artery disease)   Sepsis (HCC)   Anemia of chronic disease   Acute metabolic encephalopathy   AF (paroxysmal atrial fibrillation) (HCC)   AKI (acute kidney injury) (HCC)   Streptococcal bacteremia  Severe sepsis secondary to Streptococcus bacteremia Source of bacteremia is unclear This is a recurrent event TTE negative CSF negative Stable on penicillin TEE negative Source remains unclear Plan: Continue penicillin If we are able to reach the legal guardian to obtain consent patient will likely undergo colonoscopy to rule out GI malignancy as a potential source for recurrent bacteremia.  Appreciate ID follow-up.  Patient will likely need protracted IV antibiotics regardless of colonoscopy  Paroxysmal atrial fibrillation Eliquis temporarily on hold Continue to hold until plan for colonoscopy is set  Schizophrenia Cognitive impairment Mental  status appears to be at baseline Continue current psychiatric regimen  Acute metabolic encephalopathy Resolved  Acute kidney injury Resolved  Type 2 diabetes mellitus with hyperglycemia Blood glucose stable Continue current regimen  Hypertension BP reasonably well controlled Continue current regimen   DVT prophylaxis: SCD Code Status: Full Family Communication: None today Disposition Plan: Status is: Inpatient  Remains inpatient appropriate because: Awaiting callback from legal guardian to obtain consent for colonoscopy      Level of care: Med-Surg  Consultants:  ID GI  Procedures:  TEE 11/2  Antimicrobials: Penicillin   Subjective: Seen and examined.  Sitting comfortably in chair.  No visible distress.  Objective: Vitals:   09/20/21 1957 09/21/21 0423 09/21/21 0811 09/21/21 0956  BP: (!) 143/94 113/73 125/82   Pulse: 82 (!) 41 (!) 40 (!) 47  Resp: 18 18 16    Temp: 97.7 F (36.5 C) 97.8 F (36.6 C) 97.8 F (36.6 C)   TempSrc: Oral Oral Oral   SpO2: 100% 99% 100% 100%  Weight:      Height:        Intake/Output Summary (Last 24 hours) at 09/21/2021 1055 Last data filed at 09/21/2021 1031 Gross per 24 hour  Intake 2930 ml  Output 250 ml  Net 2680 ml   Filed Weights   09/17/21 0223 09/18/21 1951  Weight: 102.8 kg 105.8 kg    Examination:  General exam: No apparent distress.  Sitting up in chair Respiratory system: Lungs clear.  Normal work of breathing.  Room air Cardiovascular system: S1-S2, RRR, no murmurs Gastrointestinal system: Soft, NT/ND, normal bowel sounds Central nervous system: Alert, pleasant, oriented x1, no focal deficits Extremities: Symmetric  5 x 5 power. Skin: No rashes, lesions or ulcers Psychiatry: Judgement and insight appear impaired. Mood & affect confused.     Data Reviewed: I have personally reviewed following labs and imaging studies  CBC: Recent Labs  Lab 09/17/21 0200 09/18/21 0936 09/21/21 0736  WBC  10.3 8.1 6.0  NEUTROABS 8.6* 4.7 1.8  HGB 10.3* 10.3* 9.1*  HCT 30.6* 30.3* 28.1*  MCV 102.3* 101.0* 102.9*  PLT 121* 104* [id number]*   Basic Metabolic Panel: Recent Labs  Lab 09/17/21 0200 09/18/21 0936 09/19/21 0539 09/21/21 0736  NA 136 135  --  136  K 4.0 3.9  --  4.6  CL 101 102  --  104  CO2 26 28  --  26  GLUCOSE 108* 105*  --  99  BUN 25* 19  --  16  CREATININE 1.50* 1.06 0.97 0.95  CALCIUM 8.9 8.4*  --  8.6*   GFR: Estimated Creatinine Clearance: 99.1 mL/min (by C-G formula based on SCr of 0.95 mg/dL). Liver Function Tests: Recent Labs  Lab 09/17/21 0200  AST 22  ALT 15  ALKPHOS 44  BILITOT 0.7  PROT 7.1  ALBUMIN 3.4*   Recent Labs  Lab 09/17/21 0200  LIPASE 35   No results for input(s): AMMONIA in the last 168 hours. Coagulation Profile: Recent Labs  Lab 09/17/21 0200 09/18/21 0936  INR 1.2 1.0   Cardiac Enzymes: No results for input(s): CKTOTAL, CKMB, CKMBINDEX, TROPONINI in the last 168 hours. BNP (last 3 results) No results for input(s): PROBNP in the last 8760 hours. HbA1C: No results for input(s): HGBA1C in the last 72 hours. CBG: Recent Labs  Lab 09/20/21 1656 09/20/21 1959 09/20/21 2353 09/21/21 0413 09/21/21 0810  GLUCAP 175* 142* 139* 95 97   Lipid Profile: No results for input(s): CHOL, HDL, LDLCALC, TRIG, CHOLHDL, LDLDIRECT in the last 72 hours. Thyroid Function Tests: No results for input(s): TSH, T4TOTAL, FREET4, T3FREE, THYROIDAB in the last 72 hours. Anemia Panel: No results for input(s): VITAMINB12, FOLATE, FERRITIN, TIBC, IRON, RETICCTPCT in the last 72 hours. Sepsis Labs: Recent Labs  Lab 09/17/21 0200 09/18/21 0936  PROCALCITON 0.24 0.54  LATICACIDVEN 1.7  --     Recent Results (from the past 240 hour(s))  Blood Culture (routine x 2)     Status: Abnormal   Collection Time: 09/17/21  2:00 AM   Specimen: BLOOD  Result Value Ref Range Status   Specimen Description   Final    BLOOD RAC Performed at [facility], [address], [place], [place] [address]    Special Requests   Final    BOTTLES DRAWN AEROBIC AND ANAEROBIC Blood Culture adequate volume Performed at [facility], [address] [place], [place] [address]    Culture  Setup Time   Final    GRAM POSITIVE COCCI IN BOTH AEROBIC AND ANAEROBIC BOTTLES CRITICAL RESULT CALLED TO, READ BACK BY AND VERIFIED WITH: WALID NAZARI 09/17/21 1426 AMK    Culture STREPTOCOCCUS GROUP G (A)  Final   Report Status 09/19/2021 FINAL  Final   Organism ID, Bacteria STREPTOCOCCUS GROUP G  Final      Susceptibility   Streptococcus group g - MIC*    CLINDAMYCIN RESISTANT Resistant     AMPICILLIN <=0.25 SENSITIVE Sensitive     ERYTHROMYCIN >=8 RESISTANT Resistant     VANCOMYCIN 0.5 SENSITIVE Sensitive     CEFTRIAXONE <=0.12 SENSITIVE Sensitive     LEVOFLOXACIN 0.5 SENSITIVE Sensitive  PENICILLIN Value in next row Sensitive      SENSITIVE0.06    * STREPTOCOCCUS GROUP G  Blood Culture (routine x 2)     Status: Abnormal   Collection Time: 09/17/21  2:00 AM   Specimen: BLOOD  Result Value Ref Range Status   Specimen Description   Final    BLOOD RH Performed at [facility], [address] [place], [place] [address]    Special Requests   Final    BOTTLES DRAWN AEROBIC AND ANAEROBIC Blood Culture adequate volume Performed at [facility], [address] [place], [place] [address]    Culture  Setup Time   Final    GRAM POSITIVE COCCI IN BOTH AEROBIC AND ANAEROBIC BOTTLES CRITICAL RESULT CALLED TO, READ BACK BY AND VERIFIED WITH: WALID NAZARI 09/17/21 1426 AMK    Culture (A)  Final    STREPTOCOCCUS GROUP G SUSCEPTIBILITIES PERFORMED ON PREVIOUS CULTURE WITHIN THE LAST 5 DAYS. Performed at [facility], [place] [address], [place], [place] [address]    Report Status 09/19/2021 FINAL  Final  Urine Culture     Status: None   Collection Time: 09/17/21  2:00 AM   Specimen: In/Out Cath Urine  Result  Value Ref Range Status   Specimen Description   Final    IN/OUT CATH URINE Performed at [facility], [address], [place], [place] [address]    Special Requests   Final    NONE Performed at [facility], [address], [place], [place] [address]    Culture   Final    NO GROWTH Performed at [facility], [place] [address], [place], [place] [address]    Report Status 09/18/2021 FINAL  Final  Resp Panel by RT-PCR (Flu A&B, Covid) Nasopharyngeal Swab     Status: None   Collection Time: 09/17/21  2:00 AM   Specimen: Nasopharyngeal Swab; Nasopharyngeal(NP) swabs in vial transport medium  Result Value Ref Range Status   SARS Coronavirus 2 by RT PCR NEGATIVE NEGATIVE Final    Comment: (NOTE) SARS-CoV-2 target nucleic acids are NOT DETECTED.  The SARS-CoV-2 RNA is generally detectable in upper respiratory specimens during the acute phase of infection. The lowest concentration of SARS-CoV-2 viral copies this assay can detect is 138 copies/mL. A negative result does not preclude SARS-Cov-2 infection and should not be used as the sole basis for treatment or other patient management decisions. A negative result may occur with  improper specimen collection/handling, submission of specimen other than nasopharyngeal swab, presence of viral mutation(s) within the areas targeted by this assay, and inadequate number of viral copies(<138 copies/mL). A negative result must be combined with clinical observations, patient history, and epidemiological information. The expected result is Negative.  Fact Sheet for Patients:  EntrepreneurPulse.com.au  Fact Sheet for Healthcare Providers:  IncredibleEmployment.be  This test is no t yet approved or cleared by the [place] FDA and  has been authorized for detection and/or diagnosis of SARS-CoV-2 by FDA under an Emergency Use Authorization (EUA). This EUA will remain  in effect (meaning  this test can be used) for the duration of the COVID-19 declaration under Section 564(b)(1) of the Act, 21 U.S.C.section 360bbb-3(b)(1), unless the authorization is terminated  or revoked sooner.       Influenza A by PCR NEGATIVE NEGATIVE Final   Influenza B by PCR NEGATIVE NEGATIVE Final    Comment: (NOTE) The Xpert Xpress SARS-CoV-2/FLU/RSV plus assay is intended as an aid in  the diagnosis of influenza from Nasopharyngeal swab specimens and should not be used as a sole basis for treatment. Nasal washings and aspirates are unacceptable for Xpert Xpress SARS-CoV-2/FLU/RSV testing.  Fact Sheet for Patients: EntrepreneurPulse.com.au  Fact Sheet for Healthcare Providers: IncredibleEmployment.be  This test is not yet approved or cleared by the [place] FDA and has been authorized for detection and/or diagnosis of SARS-CoV-2 by FDA under an Emergency Use Authorization (EUA). This EUA will remain in effect (meaning this test can be used) for the duration of the COVID-19 declaration under Section 564(b)(1) of the Act, 21 U.S.C. section 360bbb-3(b)(1), unless the authorization is terminated or revoked.  Performed at [facility], [address] [place], [place] [address]   Blood Culture ID Panel (Reflexed)     Status: Abnormal   Collection Time: 09/17/21  2:00 AM  Result Value Ref Range Status   Enterococcus faecalis NOT DETECTED NOT DETECTED Final   Enterococcus Faecium NOT DETECTED NOT DETECTED Final   Listeria monocytogenes NOT DETECTED NOT DETECTED Final   Staphylococcus species NOT DETECTED NOT DETECTED Final   Staphylococcus aureus (BCID) NOT DETECTED NOT DETECTED Final   Staphylococcus epidermidis NOT DETECTED NOT DETECTED Final   Staphylococcus lugdunensis NOT DETECTED NOT DETECTED Final   Streptococcus species DETECTED (A) NOT DETECTED Final    Comment: Not Enterococcus species, Streptococcus agalactiae, Streptococcus  pyogenes, or Streptococcus pneumoniae. CRITICAL RESULT CALLED TO, READ BACK BY AND VERIFIED WITH: WALID NAZARI 09/17/21 1426 AMK    Streptococcus agalactiae NOT DETECTED NOT DETECTED Final   Streptococcus pneumoniae NOT DETECTED NOT DETECTED Final   Streptococcus pyogenes NOT DETECTED NOT DETECTED Final   A.calcoaceticus-baumannii NOT DETECTED NOT DETECTED Final   Bacteroides fragilis NOT DETECTED NOT DETECTED Final   Enterobacterales NOT DETECTED NOT DETECTED Final   Enterobacter cloacae complex NOT DETECTED NOT DETECTED Final   Escherichia coli NOT DETECTED NOT DETECTED Final   Klebsiella aerogenes NOT DETECTED NOT DETECTED Final   Klebsiella oxytoca NOT DETECTED NOT DETECTED Final   Klebsiella pneumoniae NOT DETECTED NOT DETECTED Final   Proteus species NOT DETECTED NOT DETECTED Final   Salmonella species NOT DETECTED NOT DETECTED Final   Serratia marcescens NOT DETECTED NOT DETECTED Final   Haemophilus influenzae NOT DETECTED NOT DETECTED Final   Neisseria meningitidis NOT DETECTED NOT DETECTED Final   Pseudomonas aeruginosa NOT DETECTED NOT DETECTED Final   Stenotrophomonas maltophilia NOT DETECTED NOT DETECTED Final   Candida albicans NOT DETECTED NOT DETECTED Final   Candida auris NOT DETECTED NOT DETECTED Final   Candida glabrata NOT DETECTED NOT DETECTED Final   Candida krusei NOT DETECTED NOT DETECTED Final   Candida parapsilosis NOT DETECTED NOT DETECTED Final   Candida tropicalis NOT DETECTED NOT DETECTED Final   Cryptococcus neoformans/gattii NOT DETECTED NOT DETECTED Final    Comment: Performed at [facility], [address] [place], [place] [address]  CSF culture w Gram Stain     Status: None (Preliminary result)   Collection Time: 09/18/21  2:14 PM   Specimen: PATH Cytology CSF; Cerebrospinal Fluid  Result Value Ref Range Status   Specimen Description   Final    CSF Performed at [facility], [address] [place], [place] [address]     Special Requests   Final    NONE Performed at [facility], [address] [place], [place] [address]    Gram Stain   Final    WBC PRESENT, PREDOMINANTLY MONONUCLEAR NO ORGANISMS SEEN CYTOSPIN SMEAR  Culture   Final    NO GROWTH 3 DAYS Performed at [facility], [place] [address], [place], [place] [address]    Report Status PENDING  Incomplete  CULTURE, BLOOD (ROUTINE X 2) w Reflex to ID Panel     Status: None (Preliminary result)   Collection Time: 09/20/21  6:50 AM   Specimen: BLOOD  Result Value Ref Range Status   Specimen Description BLOOD RIGHT ARM  Final   Special Requests   Final    BOTTLES DRAWN AEROBIC AND ANAEROBIC Blood Culture adequate volume   Culture   Final    NO GROWTH 1 DAY Performed at [facility], [address] [place], [place] [address]    Report Status PENDING  Incomplete  CULTURE, BLOOD (ROUTINE X 2) w Reflex to ID Panel     Status: None (Preliminary result)   Collection Time: 09/20/21  6:55 AM   Specimen: BLOOD  Result Value Ref Range Status   Specimen Description BLOOD RIGHT HAND  Final   Special Requests   Final    BOTTLES DRAWN AEROBIC AND ANAEROBIC Blood Culture adequate volume   Culture   Final    NO GROWTH 1 DAY Performed at [facility], [address] [place], [place] [address]    Report Status PENDING  Incomplete         Radiology Studies: ECHO TEE  Result Date: 09/20/2021    TRANSESOPHOGEAL ECHO REPORT   Patient Name:   GLEASON BERBER Date of Exam: 09/20/2021 Medical Rec #:  [id number]     Height:       74.0 in Accession #:    [id number]    Weight:       233.2 lb Date of Birth:  27-Jun-1956    BSA:          2.320 m Patient Age:    14 years      BP:           112/78 mmHg Patient Gender: M             HR:           49 bpm. Exam Location:  [facility] Procedure: Transesophageal Echo, Limited Color Doppler and Saline Contrast            Bubble Study Indications:     R78.81 Bacteremia  History:         Patient  has prior history of Echocardiogram examinations, most                  recent 09/18/2021. CAD, COPD; Arrythmias:Atrial Fibrillation.  Sonographer:     Charmayne Sheer Referring Phys:  [place] Diagnosing Phys: Bartholome Bill MD PROCEDURE: TEE procedure time was 15 minutes. The transesophogeal probe was passed without difficulty through the esophogus of the patient. Imaged were obtained with the patient in a supine position. Sedation performed by different physician. The patient  was monitored while under deep sedation. Image quality was excellent. The patient's vital signs; including heart rate, blood pressure, and oxygen saturation; remained stable throughout the procedure. The patient developed no complications during the procedure. IMPRESSIONS  1. Left ventricular ejection fraction, by estimation, is 60 to 65%. The left ventricle has normal function. The left ventricle has no regional wall motion abnormalities. Left ventricular diastolic function could not be evaluated.  2. Right ventricular systolic function is normal. The right ventricular size is normal.  3. No left atrial/left atrial appendage thrombus was detected.  4. The mitral valve is normal in structure. Trivial mitral valve regurgitation.  5. The aortic valve is tricuspid. Aortic valve regurgitation is trivial. Conclusion(s)/Recommendation(s): Normal biventricular function without evidence of hemodynamically significant valvular heart disease. No evidence of vegetation/infective endocarditis on this transesophageal echocardiogram. FINDINGS  Left Ventricle: Left ventricular ejection fraction, by estimation, is 60 to 65%. The left ventricle has normal function. The left ventricle has no regional wall motion abnormalities. The left ventricular internal cavity size was normal in size. There is  no left ventricular hypertrophy. Left ventricular diastolic function could not be evaluated. Right Ventricle: The right ventricular size is normal. No  increase in right ventricular wall thickness. Right ventricular systolic function is normal. Left Atrium: Left atrial size was normal in size. No left atrial/left atrial appendage thrombus was detected. Right Atrium: Right atrial size was normal in size. Pericardium: There is no evidence of pericardial effusion. Mitral Valve: The mitral valve is normal in structure. Trivial mitral valve regurgitation. There is no evidence of mitral valve vegetation. Tricuspid Valve: The tricuspid valve is normal in structure. Tricuspid valve regurgitation is trivial. There is no evidence of tricuspid valve vegetation. Aortic Valve: The aortic valve is tricuspid. Aortic valve regurgitation is trivial. There is no evidence of aortic valve vegetation. Pulmonic Valve: The pulmonic valve was grossly normal. Pulmonic valve regurgitation is trivial. There is no evidence of pulmonic valve vegetation. Aorta: The aortic root is normal in size and structure. IAS/Shunts: No atrial level shunt detected by color flow Doppler. Agitated saline contrast was given intravenously to evaluate for intracardiac shunting. There is no evidence of a patent foramen ovale. There is no evidence of an atrial septal defect. Bartholome Bill MD Electronically signed by Bartholome Bill MD Signature Date/Time: 09/20/2021/12:51:35 PM    Final         Scheduled Meds:  amiodarone  200 mg Oral Daily   aspirin EC  81 mg Oral Daily   atorvastatin  10 mg Oral Daily   benztropine  1 mg Oral QHS   brimonidine  1 drop Both Eyes BID   And   timolol  1 drop Both Eyes BID   cholecalciferol  2,000 Units Oral Daily   citalopram  20 mg Oral Daily   divalproex  2,000 mg Oral QHS   donepezil  10 mg Oral QHS   ferrous sulfate  325 mg Oral Q breakfast   insulin aspart  0-15 Units Subcutaneous Q4H   latanoprost  1 drop Both Eyes QHS   melatonin  5 mg Oral QHS   multivitamin with minerals  1 tablet Oral Daily   pantoprazole  40 mg Oral BID   Ensure Max Protein  11 oz Oral  BID   risperidone  4 mg Oral QHS   Continuous Infusions:  penicillin g continuous IV infusion 12 Million Units (09/21/21 0936)     LOS: 4 days    Time spent: 25 minutes    Sidney Ace, MD [facility]   If 7PM-7AM, please contact night-coverage  09/21/2021, 10:55 AM

## 2021-09-21 NOTE — TOC CM/SW Note (Signed)
Re: Brian Dodson Date of Birth: 10/23/1956 Date: 09/21/2021   To Whom It May Concern:  Please be advised that the above-name patient's dementia diagnosis is primary and supersedes his mental illness.

## 2021-09-22 ENCOUNTER — Inpatient Hospital Stay: Payer: Medicare Other | Admitting: Anesthesiology

## 2021-09-22 ENCOUNTER — Encounter: Payer: Self-pay | Admitting: Internal Medicine

## 2021-09-22 ENCOUNTER — Encounter
Admission: EM | Disposition: A | Discharge status: Skilled Nursing Facility | Payer: Self-pay | Source: Skilled Nursing Facility | Attending: Internal Medicine

## 2021-09-22 DIAGNOSIS — B955 Unspecified streptococcus as the cause of diseases classified elsewhere: Secondary | ICD-10-CM | POA: Diagnosis not present

## 2021-09-22 DIAGNOSIS — R7881 Bacteremia: Secondary | ICD-10-CM | POA: Diagnosis not present

## 2021-09-22 HISTORY — PX: COLONOSCOPY WITH PROPOFOL: SHX5780

## 2021-09-22 LAB — CSF CULTURE W GRAM STAIN: Culture: NO GROWTH

## 2021-09-22 LAB — GLUCOSE, CAPILLARY
Glucose-Capillary: 108 mg/dL — ABNORMAL HIGH (ref 70–99)
Glucose-Capillary: 111 mg/dL — ABNORMAL HIGH (ref 70–99)
Glucose-Capillary: 75 mg/dL (ref 70–99)
Glucose-Capillary: 76 mg/dL (ref 70–99)
Glucose-Capillary: 80 mg/dL (ref 70–99)
Glucose-Capillary: 90 mg/dL (ref 70–99)
Glucose-Capillary: 90 mg/dL (ref 70–99)

## 2021-09-22 LAB — HEMOGLOBIN: Hemoglobin: 10 g/dL — ABNORMAL LOW (ref 13.0–17.0)

## 2021-09-22 SURGERY — COLONOSCOPY WITH PROPOFOL
Anesthesia: General

## 2021-09-22 MED ORDER — APIXABAN 5 MG PO TABS
5.0000 mg | ORAL_TABLET | Freq: Two times a day (BID) | ORAL | Status: DC
  Administered 2021-09-23 – 2021-09-27 (×8): 5 mg via ORAL
  Filled 2021-09-22 (×9): qty 1

## 2021-09-22 MED ORDER — PROPOFOL 500 MG/50ML IV EMUL
INTRAVENOUS | Status: DC | PRN
  Administered 2021-09-22: 140 ug/kg/min via INTRAVENOUS

## 2021-09-22 MED ORDER — PHENYLEPHRINE HCL (PRESSORS) 10 MG/ML IV SOLN
INTRAVENOUS | Status: DC | PRN
  Administered 2021-09-22 (×3): 100 ug via INTRAVENOUS

## 2021-09-22 MED ORDER — PROPOFOL 10 MG/ML IV BOLUS
INTRAVENOUS | Status: DC | PRN
  Administered 2021-09-22: 60 mg via INTRAVENOUS

## 2021-09-22 MED ORDER — SODIUM CHLORIDE 0.9 % IV SOLN: INTRAVENOUS | Status: DC | PRN

## 2021-09-22 MED ORDER — SODIUM CHLORIDE 0.9 % IV SOLN: INTRAVENOUS | Status: DC

## 2021-09-22 MED ORDER — LIDOCAINE 2% (20 MG/ML) 5 ML SYRINGE
INTRAMUSCULAR | Status: DC | PRN
  Administered 2021-09-22: 50 mg via INTRAVENOUS

## 2021-09-22 NOTE — Anesthesia Postprocedure Evaluation (Signed)
Anesthesia Post Note  Patient: Brian Dodson  Procedure(s) Performed: COLONOSCOPY WITH PROPOFOL  Patient location during evaluation: Endoscopy Anesthesia Type: General Level of consciousness: awake and alert Pain management: pain level controlled Vital Signs Assessment: post-procedure vital signs reviewed and stable Respiratory status: spontaneous breathing, nonlabored ventilation, respiratory function stable and patient connected to nasal cannula oxygen Cardiovascular status: blood pressure returned to baseline and stable Postop Assessment: no apparent nausea or vomiting Anesthetic complications: no   No notable events documented.   Last Vitals:  Vitals:   09/22/21 1636 09/22/21 1727  BP: (!) 137/92 134/88  Pulse: 61 73  Resp: 18 17  Temp: 36.4 C 36.8 C  SpO2: 99% 100%    Last Pain:  Vitals:   09/22/21 1727  TempSrc: Oral  PainSc:                  Lenard Simmer

## 2021-09-22 NOTE — Progress Notes (Signed)
Mobility Specialist - Progress Note   09/22/21 1300  Mobility  Activity Ambulated in hall  Level of Assistance Standby assist, set-up cues, supervision of patient - no hands on  Assistive Device Other (Comment) (IV)  Distance Ambulated (ft) 640 ft  Mobility Ambulated with assistance in hallway  Mobility Response Tolerated well  Mobility performed by Mobility specialist  $Mobility charge 1 Mobility    Pt ambulated in hallway with supervision.    Filiberto Pinks Mobility Specialist 09/22/21, 2:10 PM

## 2021-09-22 NOTE — Anesthesia Procedure Notes (Signed)
Date/Time: 09/22/2021 3:11 PM Performed by: Joanette Gula, Rolande Moe, CRNA Pre-anesthesia Checklist: Patient identified, Emergency Drugs available, Suction available, Timeout performed and Patient being monitored Patient Re-evaluated:Patient Re-evaluated prior to induction Oxygen Delivery Method: Nasal cannula Induction Type: IV induction

## 2021-09-22 NOTE — Progress Notes (Signed)
PROGRESS NOTE    Brian Dodson  HKU:[id number] DOB: [birth year] DOA: 09/17/2021 PCP: Galvin Proffer, MD    Brief Narrative:  65 y.o. male with medical history significant for paroxysmal atrial fibrillation on Eliquis, hypertension, type II DM, CAD, cognitive impairment, schizophrenia, recent hospitalization from 07/22/2021 to 07/26/2021 for severe sepsis from group B strep bacteremia, recent discharge from the hospital on 08/18/2021 after hospitalization for left leg cellulitis.   He was brought to the hospital because of fever and altered mental status.  His temperature was 103.3 F in the emergency room.  Patient was Streptococcus group G bacteremia.  Source is unclear.  This is a recurrent event.  Status post TEE on 11/2 negative for endocarditis.  Source remains unclear.  Consideration for GI source.  GI consulted.  Reached legal guardian who consented to colonoscopy.  Plan for C-scope 11/4.  Patient was able to complete prep  Assessment & Plan:   Active Problems:   Diabetes mellitus, type II (HCC)   HTN (hypertension)   CAD (coronary artery disease)   Sepsis (HCC)   Anemia of chronic disease   Acute metabolic encephalopathy   AF (paroxysmal atrial fibrillation) (HCC)   AKI (acute kidney injury) (HCC)   Streptococcal bacteremia  Severe sepsis secondary to Streptococcus bacteremia Source of bacteremia is unclear This is a recurrent event TTE negative CSF negative Stable on penicillin TEE negative Source remains unclear Plan: Continue penicillin G infusion  patient will likely require extended course IV antibiotics.  Appreciate infectious disease follow-up and recommendations. Colonoscopy today to rule out GI source  Paroxysmal atrial fibrillation Eliquis temporarily on hold Can likely start on postop day 1  Schizophrenia Cognitive impairment Mental status appears to be at baseline Continue current psychiatric regimen  Acute metabolic encephalopathy Resolved  Acute  kidney injury Resolved  Type 2 diabetes mellitus with hyperglycemia Blood glucose stable Continue current regimen  Hypertension BP reasonably well controlled Continue current regimen   DVT prophylaxis: SCD Code Status: Full Family Communication: None today Disposition Plan: Status is: Inpatient  Remains inpatient appropriate because: Colonoscopy today.  Needs ID follow-up regarding ongoing IV antibiotics.     Level of care: Med-Surg  Consultants:  ID GI  Procedures:  TEE 11/2 Colonoscopy 11/4  Antimicrobials: Penicillin   Subjective: Seen and examined.  Sitting comfortably in chair.  No visible distress.  Objective: Vitals:   09/21/21 1558 09/21/21 2045 09/22/21 0409 09/22/21 0753  BP: (!) 132/98 (!) 126/91 (!) 133/96 120/86  Pulse: (!) 58 63 62 (!) 47  Resp: 16 20 16 18   Temp: 97.6 F (36.4 C) 98.2 F (36.8 C) 97.7 F (36.5 C) 97.9 F (36.6 C)  TempSrc: Oral Oral Oral Oral  SpO2: 100% 100% 98% 95%  Weight:      Height:        Intake/Output Summary (Last 24 hours) at 09/22/2021 1335 Last data filed at 09/22/2021 0414 Gross per 24 hour  Intake 720 ml  Output 2725 ml  Net -2005 ml   Filed Weights   09/17/21 0223 09/18/21 1951  Weight: 102.8 kg 105.8 kg    Examination:  General exam: No acute distress.  Sitting up in chair Respiratory system: Lungs clear.  Normal work of breathing.  Room air Cardiovascular system: S1-S2, RRR, no murmurs Gastrointestinal system: Soft, NT/ND, normal bowel sounds Central nervous system: Alert, oriented x2, no focal deficits Extremities: Symmetric 5 x 5 power. Skin: No rashes, lesions or ulcers Psychiatry: Judgement and insight appear impaired. Mood &  affect confused.     Data Reviewed: I have personally reviewed following labs and imaging studies  CBC: Recent Labs  Lab 09/17/21 0200 09/18/21 0936 09/21/21 0736  WBC 10.3 8.1 6.0  NEUTROABS 8.6* 4.7 1.8  HGB 10.3* 10.3* 9.1*  HCT 30.6* 30.3* 28.1*   MCV 102.3* 101.0* 102.9*  PLT 121* 104* 124*   Basic Metabolic Panel: Recent Labs  Lab 09/17/21 0200 09/18/21 0936 09/19/21 0539 09/21/21 0736  NA 136 135  --  136  K 4.0 3.9  --  4.6  CL 101 102  --  104  CO2 26 28  --  26  GLUCOSE 108* 105*  --  99  BUN 25* 19  --  16  CREATININE 1.50* 1.06 0.97 0.95  CALCIUM 8.9 8.4*  --  8.6*   GFR: Estimated Creatinine Clearance: 99.1 mL/min (by C-G formula based on SCr of 0.95 mg/dL). Liver Function Tests: Recent Labs  Lab 09/17/21 0200  AST 22  ALT 15  ALKPHOS 44  BILITOT 0.7  PROT 7.1  ALBUMIN 3.4*   Recent Labs  Lab 09/17/21 0200  LIPASE 35   No results for input(s): AMMONIA in the last 168 hours. Coagulation Profile: Recent Labs  Lab 09/17/21 0200 09/18/21 0936  INR 1.2 1.0   Cardiac Enzymes: No results for input(s): CKTOTAL, CKMB, CKMBINDEX, TROPONINI in the last 168 hours. BNP (last 3 results) No results for input(s): PROBNP in the last 8760 hours. HbA1C: No results for input(s): HGBA1C in the last 72 hours. CBG: Recent Labs  Lab 09/21/21 2048 09/22/21 0025 09/22/21 0407 09/22/21 0754 09/22/21 1220  GLUCAP 100* 108* 80 90 76   Lipid Profile: No results for input(s): CHOL, HDL, LDLCALC, TRIG, CHOLHDL, LDLDIRECT in the last 72 hours. Thyroid Function Tests: No results for input(s): TSH, T4TOTAL, FREET4, T3FREE, THYROIDAB in the last 72 hours. Anemia Panel: No results for input(s): VITAMINB12, FOLATE, FERRITIN, TIBC, IRON, RETICCTPCT in the last 72 hours. Sepsis Labs: Recent Labs  Lab 09/17/21 0200 09/18/21 0936  PROCALCITON 0.24 0.54  LATICACIDVEN 1.7  --     Recent Results (from the past 240 hour(s))  Blood Culture (routine x 2)     Status: Abnormal   Collection Time: 09/17/21  2:00 AM   Specimen: BLOOD  Result Value Ref Range Status   Specimen Description   Final    BLOOD RAC Performed at [facility], [address] [place], [place] [address]    Special Requests   Final     BOTTLES DRAWN AEROBIC AND ANAEROBIC Blood Culture adequate volume Performed at [facility], [address] [place], [place] [address]    Culture  Setup Time   Final    GRAM POSITIVE COCCI IN BOTH AEROBIC AND ANAEROBIC BOTTLES CRITICAL RESULT CALLED TO, READ BACK BY AND VERIFIED WITH: Harrell Lark NAZARI 09/17/21 1426 AMK    Culture STREPTOCOCCUS GROUP G (A)  Final   Report Status 09/19/2021 FINAL  Final   Organism ID, Bacteria STREPTOCOCCUS GROUP G  Final      Susceptibility   Streptococcus group g - MIC*    CLINDAMYCIN RESISTANT Resistant     AMPICILLIN <=0.25 SENSITIVE Sensitive     ERYTHROMYCIN >=8 RESISTANT Resistant     VANCOMYCIN 0.5 SENSITIVE Sensitive     CEFTRIAXONE <=0.12 SENSITIVE Sensitive     LEVOFLOXACIN 0.5 SENSITIVE Sensitive     PENICILLIN Value in next row Sensitive      SENSITIVE0.06    * STREPTOCOCCUS GROUP  G  Blood Culture (routine x 2)     Status: Abnormal   Collection Time: 09/17/21  2:00 AM   Specimen: BLOOD  Result Value Ref Range Status   Specimen Description   Final    BLOOD RH Performed at [facility], [address] [place], [place] [address]    Special Requests   Final    BOTTLES DRAWN AEROBIC AND ANAEROBIC Blood Culture adequate volume Performed at [facility], [address] [place], [place] [address]    Culture  Setup Time   Final    GRAM POSITIVE COCCI IN BOTH AEROBIC AND ANAEROBIC BOTTLES CRITICAL RESULT CALLED TO, READ BACK BY AND VERIFIED WITH: WALID NAZARI 09/17/21 1426 AMK    Culture (A)  Final    STREPTOCOCCUS GROUP G SUSCEPTIBILITIES PERFORMED ON PREVIOUS CULTURE WITHIN THE LAST 5 DAYS. Performed at [facility], [address] [place], [place] [address]    Report Status 09/19/2021 FINAL  Final  Urine Culture     Status: None   Collection Time: 09/17/21  2:00 AM   Specimen: In/Out Cath Urine  Result Value Ref Range Status   Specimen Description   Final    IN/OUT CATH URINE Performed at  [facility], [address] [place], [place] [address]    Special Requests   Final    NONE Performed at [facility], [address] [place], [place] [address]    Culture   Final    NO GROWTH Performed at [facility], [address], [place], [place] [address]    Report Status 09/18/2021 FINAL  Final  Resp Panel by RT-PCR (Flu A&B, Covid) Nasopharyngeal Swab     Status: None   Collection Time: 09/17/21  2:00 AM   Specimen: Nasopharyngeal Swab; Nasopharyngeal(NP) swabs in vial transport medium  Result Value Ref Range Status   SARS Coronavirus 2 by RT PCR NEGATIVE NEGATIVE Final    Comment: (NOTE) SARS-CoV-2 target nucleic acids are NOT DETECTED.  The SARS-CoV-2 RNA is generally detectable in upper respiratory specimens during the acute phase of infection. The lowest concentration of SARS-CoV-2 viral copies this assay can detect is 138 copies/mL. A negative result does not preclude SARS-Cov-2 infection and should not be used as the sole basis for treatment or other patient management decisions. A negative result may occur with  improper specimen collection/handling, submission of specimen other than nasopharyngeal swab, presence of viral mutation(s) within the areas targeted by this assay, and inadequate number of viral copies(<138 copies/mL). A negative result must be combined with clinical observations, patient history, and epidemiological information. The expected result is Negative.  Fact Sheet for Patients:  BloggerCourse.com  Fact Sheet for Healthcare Providers:  SeriousBroker.it  This test is no t yet approved or cleared by the [place] FDA and  has been authorized for detection and/or diagnosis of SARS-CoV-2 by FDA under an Emergency Use Authorization (EUA). This EUA will remain  in effect (meaning this test can be used) for the duration of the COVID-19 declaration under Section  564(b)(1) of the Act, 21 U.S.C.section 360bbb-3(b)(1), unless the authorization is terminated  or revoked sooner.       Influenza A by PCR NEGATIVE NEGATIVE Final   Influenza B by PCR NEGATIVE NEGATIVE Final    Comment: (NOTE) The Xpert Xpress SARS-CoV-2/FLU/RSV plus assay is intended as an aid in the diagnosis of influenza from Nasopharyngeal swab specimens and should not be used as a sole basis for  treatment. Nasal washings and aspirates are unacceptable for Xpert Xpress SARS-CoV-2/FLU/RSV testing.  Fact Sheet for Patients: BloggerCourse.com  Fact Sheet for Healthcare Providers: SeriousBroker.it  This test is not yet approved or cleared by the [place] FDA and has been authorized for detection and/or diagnosis of SARS-CoV-2 by FDA under an Emergency Use Authorization (EUA). This EUA will remain in effect (meaning this test can be used) for the duration of the COVID-19 declaration under Section 564(b)(1) of the Act, 21 U.S.C. section 360bbb-3(b)(1), unless the authorization is terminated or revoked.  Performed at [facility], [address] [place], [place] [address]   Blood Culture ID Panel (Reflexed)     Status: Abnormal   Collection Time: 09/17/21  2:00 AM  Result Value Ref Range Status   Enterococcus faecalis NOT DETECTED NOT DETECTED Final   Enterococcus Faecium NOT DETECTED NOT DETECTED Final   Listeria monocytogenes NOT DETECTED NOT DETECTED Final   Staphylococcus species NOT DETECTED NOT DETECTED Final   Staphylococcus aureus (BCID) NOT DETECTED NOT DETECTED Final   Staphylococcus epidermidis NOT DETECTED NOT DETECTED Final   Staphylococcus lugdunensis NOT DETECTED NOT DETECTED Final   Streptococcus species DETECTED (A) NOT DETECTED Final    Comment: Not Enterococcus species, Streptococcus agalactiae, Streptococcus pyogenes, or Streptococcus pneumoniae. CRITICAL RESULT CALLED TO, READ BACK BY AND  VERIFIED WITH: WALID NAZARI 09/17/21 1426 AMK    Streptococcus agalactiae NOT DETECTED NOT DETECTED Final   Streptococcus pneumoniae NOT DETECTED NOT DETECTED Final   Streptococcus pyogenes NOT DETECTED NOT DETECTED Final   A.calcoaceticus-baumannii NOT DETECTED NOT DETECTED Final   Bacteroides fragilis NOT DETECTED NOT DETECTED Final   Enterobacterales NOT DETECTED NOT DETECTED Final   Enterobacter cloacae complex NOT DETECTED NOT DETECTED Final   Escherichia coli NOT DETECTED NOT DETECTED Final   Klebsiella aerogenes NOT DETECTED NOT DETECTED Final   Klebsiella oxytoca NOT DETECTED NOT DETECTED Final   Klebsiella pneumoniae NOT DETECTED NOT DETECTED Final   Proteus species NOT DETECTED NOT DETECTED Final   Salmonella species NOT DETECTED NOT DETECTED Final   Serratia marcescens NOT DETECTED NOT DETECTED Final   Haemophilus influenzae NOT DETECTED NOT DETECTED Final   Neisseria meningitidis NOT DETECTED NOT DETECTED Final   Pseudomonas aeruginosa NOT DETECTED NOT DETECTED Final   Stenotrophomonas maltophilia NOT DETECTED NOT DETECTED Final   Candida albicans NOT DETECTED NOT DETECTED Final   Candida auris NOT DETECTED NOT DETECTED Final   Candida glabrata NOT DETECTED NOT DETECTED Final   Candida krusei NOT DETECTED NOT DETECTED Final   Candida parapsilosis NOT DETECTED NOT DETECTED Final   Candida tropicalis NOT DETECTED NOT DETECTED Final   Cryptococcus neoformans/gattii NOT DETECTED NOT DETECTED Final    Comment: Performed at [facility], [address] [place], [place] [address]  CSF culture w Gram Stain     Status: None   Collection Time: 09/18/21  2:14 PM   Specimen: PATH Cytology CSF; Cerebrospinal Fluid  Result Value Ref Range Status   Specimen Description   Final    CSF Performed at [facility], [address] [place], [place] [address]    Special Requests   Final    NONE Performed at [facility], [address] [place], [place]  [address]    Gram Stain   Final    WBC PRESENT, PREDOMINANTLY MONONUCLEAR NO ORGANISMS SEEN CYTOSPIN SMEAR    Culture   Final    NO GROWTH Performed at [facility], [address], [place], [place] [address]    Report Status 09/22/2021 FINAL  Final  CULTURE, BLOOD (ROUTINE X 2) w Reflex to ID Panel     Status: None (Preliminary result)   Collection Time: 09/20/21  6:50 AM   Specimen: BLOOD  Result Value Ref Range Status   Specimen Description BLOOD RIGHT ARM  Final   Special Requests   Final    BOTTLES DRAWN AEROBIC AND ANAEROBIC Blood Culture adequate volume   Culture   Final    NO GROWTH 2 DAYS Performed at [facility], [address], [place], [place] [address]    Report Status PENDING  Incomplete  CULTURE, BLOOD (ROUTINE X 2) w Reflex to ID Panel     Status: None (Preliminary result)   Collection Time: 09/20/21  6:55 AM   Specimen: BLOOD  Result Value Ref Range Status   Specimen Description BLOOD RIGHT HAND  Final   Special Requests   Final    BOTTLES DRAWN AEROBIC AND ANAEROBIC Blood Culture adequate volume   Culture   Final    NO GROWTH 2 DAYS Performed at [facility], [address], [place], [place] [address]    Report Status PENDING  Incomplete         Radiology Studies: No results found.      Scheduled Meds:  amiodarone  200 mg Oral Daily   aspirin EC  81 mg Oral Daily   atorvastatin  10 mg Oral Daily   benztropine  1 mg Oral QHS   brimonidine  1 drop Both Eyes BID   And   timolol  1 drop Both Eyes BID   cholecalciferol  2,000 Units Oral Daily   citalopram  20 mg Oral Daily   divalproex  2,000 mg Oral QHS   donepezil  10 mg Oral QHS   ferrous sulfate  325 mg Oral Q breakfast   insulin aspart  0-15 Units Subcutaneous Q4H   latanoprost  1 drop Both Eyes QHS   melatonin  5 mg Oral QHS   multivitamin with minerals  1 tablet Oral Daily   pantoprazole  40 mg Oral BID   Ensure Max Protein  11 oz Oral BID   risperidone  4  mg Oral QHS   Continuous Infusions:  penicillin g continuous IV infusion 12 Million Units (09/22/21 1245)     LOS: 5 days    Time spent: 25 minutes    Tresa Moore, MD [facility]   If 7PM-7AM, please contact night-coverage  09/22/2021, 1:35 PM

## 2021-09-22 NOTE — Progress Notes (Signed)
Patient had colonoscopy, having moderate amount of blood with liquid bowel movement and clots. Patient denies any pain or issue, just states he is  feeling shaky. VS stable at this time. MD and surgeon made aware. Will continue to monitor and report off to oncoming shift.  Orders placed.

## 2021-09-22 NOTE — TOC Progression Note (Addendum)
Transition of Care [facility]) - Progression Note    Patient Details  Name: Brian Dodson MRN: [id number] Date of Birth: [birth year]  Transition of Care [facility]) CM/SW Contact  [name], LCSW Phone Number: 09/22/2021, 9:18 AM  Clinical Narrative:   Patient has two bed offers so far: [place] and [facility] in [place]. Emailed bed offers and those that are still pending to DSS supervisor, [name], for review.  2:55 pm: Asked [facility] to review.  Expected Discharge Plan: Assisted Living Barriers to Discharge: Continued Medical Work up  Expected Discharge Plan and Services Expected Discharge Plan: Assisted Living   Discharge Planning Services: CM Consult Post Acute Care Choice: Resumption of Svcs/PTA Provider Living arrangements for the past 2 months: Assisted Living Facility                 DME Arranged: N/A DME Agency: NA       HH Arranged: NA           Social Determinants of Health (SDOH) Interventions    Readmission Risk Interventions Readmission Risk Prevention Plan 09/19/2021  Transportation Screening Complete  PCP or Specialist Appt within 3-5 Days Complete  HRI or Home Care Consult Complete  Social Work Consult for Recovery Care Planning/Counseling Complete  Palliative Care Screening Not Applicable  Medication Review [employer]) Complete  Some recent data might be hidden

## 2021-09-22 NOTE — Care Plan (Signed)
Colonoscopy performed. One polyp noted in rectum. Prep was inadequate but no large mass lesions were seen. Polyps could be missed due to inadequate prep. Repeat colonoscopy can be considered as an outpatient.  Merlyn Lot MD, MPH [facility] GI

## 2021-09-22 NOTE — Anesthesia Preprocedure Evaluation (Addendum)
Anesthesia Evaluation  Patient identified by MRN, date of birth, ID band Patient awake    Reviewed: Allergy & Precautions, H&P , NPO status , Patient's Chart, lab work & pertinent test results  History of Anesthesia Complications Negative for: history of anesthetic complications  Airway Mallampati: II  TM Distance: >3 FB Neck ROM: full    Dental   Multiple broken and missing teeth:   Pulmonary shortness of breath, asthma , neg sleep apnea, COPD, former smoker,    Pulmonary exam normal        Cardiovascular hypertension, (-) angina+ CAD  (-) Past MI and (-) Cardiac Stents Normal cardiovascular exam+ dysrhythmias (AFib on Eliquis) Atrial Fibrillation      Neuro/Psych PSYCHIATRIC DISORDERS Schizophrenia Dementia negative neurological ROS     GI/Hepatic Neg liver ROS, GERD  ,  Endo/Other  diabetes  Renal/GU Renal disease (AKI)  negative genitourinary   Musculoskeletal   Abdominal   Peds  Hematology  (+) Blood dyscrasia (Hgb 9.1 g/dL), anemia ,   Anesthesia Other Findings GI note:  "65 y/o male with a PMH of paroxsymal atrial fibrillation on Eliquis, HTN, CAD, T2DM, schizophrenia admitted to [facility] for severe sepsis secondary to Streptococcus bacteremiaof unknown origin   1. Sepsis 2/2 Streptococcus bacteremia - source unclear, TEE negative for endocarditis, CSF negative, ID following. GI source not yet ruled out.  Posted for colonoscopy to r/o malignancy"  Reproductive/Obstetrics negative OB ROS                           Anesthesia Physical Anesthesia Plan  ASA: 3  Anesthesia Plan: General   Post-op Pain Management:    Induction: Intravenous  PONV Risk Score and Plan: Propofol infusion and TIVA  Airway Management Planned: Nasal Cannula  Additional Equipment:   Intra-op Plan:   Post-operative Plan:   Informed Consent: I have reviewed the patients History and Physical, chart, labs  and discussed the procedure including the risks, benefits and alternatives for the proposed anesthesia with the patient or authorized representative who has indicated his/her understanding and acceptance.     Dental Advisory Given  Plan Discussed with: Anesthesiologist, CRNA and Surgeon  Anesthesia Plan Comments: (Anesthesia consent obtained from pt's legal guardian Candice Gobble.  )       Anesthesia Quick Evaluation

## 2021-09-22 NOTE — Transfer of Care (Signed)
Immediate Anesthesia Transfer of Care Note  Patient: Brian Dodson  Procedure(s) Performed: COLONOSCOPY WITH PROPOFOL  Patient Location: Endoscopy Unit  Anesthesia Type:General  Level of Consciousness: drowsy  Airway & Oxygen Therapy: Patient Spontanous Breathing  Post-op Assessment: Report given to RN and Post -op Vital signs reviewed and stable  Post vital signs: Reviewed and stable  Last Vitals:  Vitals Value Taken Time  BP 100/62   Temp    Pulse 66 09/22/21 1532  Resp 16 09/22/21 1532  SpO2 98 % 09/22/21 1532  Vitals shown include unvalidated device data.  Last Pain:  Vitals:   09/22/21 1354  TempSrc: Temporal  PainSc: 0-No pain         Complications: No notable events documented.

## 2021-09-22 NOTE — Progress Notes (Signed)
Mobility Specialist - Progress Note   09/22/21 1000  Mobility  Activity Ambulated in hall  Level of Assistance Standby assist, set-up cues, supervision of patient - no hands on  Assistive Device Other (Comment) (IV pole)  Distance Ambulated (ft) 180 ft  Mobility Ambulated with assistance in hallway  Mobility Response Tolerated well  Mobility performed by Mobility specialist  $Mobility charge 1 Mobility    Pt ambulated in hallway with supervision.   Filiberto Pinks Mobility Specialist 09/22/21, 11:17 AM

## 2021-09-22 NOTE — Progress Notes (Signed)
Streptococcus Group G bacteremia TEE -neg Colonoscopy done today showed only a small polyp ( inadequate prep though) Pt will need 4 weeks of IV antibiotics Currently on Penicillin and on discharge will be on IV ceftriaxone 2 grams IV Qd until 10/18/21 With weekly CBC/CMP Follow up as OP . Will follow him peripherally this weekend

## 2021-09-22 NOTE — Op Note (Signed)
[facility] Patient Name: Brian Dodson Procedure Date: 09/22/2021 2:03 PM MRN: [id number] Account #: [id number] Date of Birth: Jul 27, 1956 Admit Type: Outpatient Age: 65 Room: [facility] ENDO ROOM 3 Gender: Male Note Status: Finalized Instrument Name: Jasper Riling [id number] Procedure:             Colonoscopy Indications:           Exclusion of colorectal cancer Providers:             Andrey Farmer MD, MD Medicines:             Monitored Anesthesia Care Complications:         No immediate complications. Estimated blood loss:                         Minimal. Procedure:             Pre-Anesthesia Assessment:                        - Prior to the procedure, a History and Physical was                         performed, and patient medications and allergies were                         reviewed. The patient is competent. The risks and                         benefits of the procedure and the sedation options and                         risks were discussed with the patient. All questions                         were answered and informed consent was obtained.                         Patient identification and proposed procedure were                         verified by the physician, the nurse, the anesthetist                         and the technician in the endoscopy suite. Mental                         Status Examination: alert and oriented. Airway                         Examination: normal oropharyngeal airway and neck                         mobility. Respiratory Examination: clear to                         auscultation. CV Examination: normal. Prophylactic                         Antibiotics: The patient does not require prophylactic  antibiotics. Prior Anticoagulants: The patient has                         taken Eliquis (apixaban), last dose was 4 days prior                         to procedure. ASA Grade Assessment: III - A  patient                         with severe systemic disease. After reviewing the                         risks and benefits, the patient was deemed in                         satisfactory condition to undergo the procedure. The                         anesthesia plan was to use monitored anesthesia care                         (MAC). Immediately prior to administration of                         medications, the patient was re-assessed for adequacy                         to receive sedatives. The heart rate, respiratory                         rate, oxygen saturations, blood pressure, adequacy of                         pulmonary ventilation, and response to care were                         monitored throughout the procedure. The physical                         status of the patient was re-assessed after the                         procedure.                        After obtaining informed consent, the colonoscope was                         passed under direct vision. Throughout the procedure,                         the patient's blood pressure, pulse, and oxygen                         saturations were monitored continuously. The                         Colonoscope was introduced through the anus and  advanced to the the cecum, identified by appendiceal                         orifice and ileocecal valve. The colonoscopy was                         somewhat difficult due to inadequate bowel prep. The                         patient tolerated the procedure well. The quality of                         the bowel preparation was inadequate. Findings:      The perianal and digital rectal examinations were normal.      A 9 mm polyp was found in the rectum. The polyp was sessile. The polyp       was removed with a cold snare. Resection and retrieval were complete.       Estimated blood loss was minimal.      Internal hemorrhoids were found during retroflexion. The  hemorrhoids       were Grade I (internal hemorrhoids that do not prolapse).      The exam was otherwise without abnormality on direct and retroflexion       views. Impression:            - Preparation of the colon was inadequate.                        - One 9 mm polyp in the rectum, removed with a cold                         snare. Resected and retrieved.                        - Internal hemorrhoids.                        - The examination was otherwise normal on direct and                         retroflexion views. Recommendation:        - Return patient to hospital ward for ongoing care.                        - Resume previous diet.                        - Continue present medications.                        - Await pathology results. No mass lesions seen. Given                         prep small polyps could be missed so would need repeat                         colonoscopy as an outpatient. Procedure Code(s):     --- Professional ---  45385, Colonoscopy, flexible; with removal of                         tumor(s), polyp(s), or other lesion(s) by snare                         technique Diagnosis Code(s):     --- Professional ---                        K64.0, First degree hemorrhoids                        K62.1, Rectal polyp CPT copyright 2019 [employer]. All rights reserved. The codes documented in this report are preliminary and upon coder review may  be revised to meet current compliance requirements. Andrey Farmer MD, MD 09/22/2021 3:31:27 PM Number of Addenda: 0 Note Initiated On: 09/22/2021 2:03 PM Scope Withdrawal Time: 0 hours 9 minutes 13 seconds  Total Procedure Duration: 0 hours 18 minutes 8 seconds  Estimated Blood Loss:  Estimated blood loss was minimal.      [facility]

## 2021-09-23 ENCOUNTER — Inpatient Hospital Stay: Payer: Medicare Other

## 2021-09-23 ENCOUNTER — Inpatient Hospital Stay: Payer: Self-pay

## 2021-09-23 DIAGNOSIS — R7881 Bacteremia: Secondary | ICD-10-CM | POA: Diagnosis not present

## 2021-09-23 DIAGNOSIS — B955 Unspecified streptococcus as the cause of diseases classified elsewhere: Secondary | ICD-10-CM | POA: Diagnosis not present

## 2021-09-23 LAB — GLUCOSE, CAPILLARY
Glucose-Capillary: 105 mg/dL — ABNORMAL HIGH (ref 70–99)
Glucose-Capillary: 171 mg/dL — ABNORMAL HIGH (ref 70–99)
Glucose-Capillary: 228 mg/dL — ABNORMAL HIGH (ref 70–99)
Glucose-Capillary: 78 mg/dL (ref 70–99)
Glucose-Capillary: 79 mg/dL (ref 70–99)
Glucose-Capillary: 89 mg/dL (ref 70–99)

## 2021-09-23 LAB — HEMOGLOBIN: Hemoglobin: 9.8 g/dL — ABNORMAL LOW (ref 13.0–17.0)

## 2021-09-23 MED ORDER — SODIUM CHLORIDE 0.9% FLUSH
10.0000 mL | Freq: Two times a day (BID) | INTRAVENOUS | Status: DC
Start: 2021-09-23 — End: 2021-09-27
  Administered 2021-09-23 – 2021-09-27 (×7): 10 mL

## 2021-09-23 MED ORDER — CHLORHEXIDINE GLUCONATE CLOTH 2 % EX PADS
6.0000 | MEDICATED_PAD | Freq: Every day | CUTANEOUS | Status: DC
  Administered 2021-09-23 – 2021-09-27 (×5): 6 via TOPICAL

## 2021-09-23 MED ORDER — SODIUM CHLORIDE 0.9% FLUSH
10.0000 mL | INTRAVENOUS | Status: DC | PRN
  Administered 2021-09-23: 10 mL

## 2021-09-23 NOTE — Progress Notes (Signed)
Peripherally Inserted Central Catheter Placement  The IV Nurse has discussed with the patient and/or persons authorized to consent for the patient, the purpose of this procedure and the potential benefits and risks involved with this procedure.  The benefits include less needle sticks, lab draws from the catheter, and the patient may be discharged home with the catheter. Risks include, but not limited to, infection, bleeding, blood clot (thrombus formation), and puncture of an artery; nerve damage and irregular heartbeat and possibility to perform a PICC exchange if needed/ordered by physician.  Alternatives to this procedure were also discussed.  Bard Power PICC patient education guide, fact sheet on infection prevention and patient information card has been provided to patient /or left at bedside.  Consent obtained by CSW from legal guardian as per previous CSW note.  PICC Placement Documentation  PICC Single Lumen 09/23/21 Right Brachial 44 cm 0 cm (Active)  Indication for Insertion or Continuance of Line Home intravenous therapies (PICC only) 09/23/21 1639  Exposed Catheter (cm) 0 cm 09/23/21 1639  Site Assessment Clean;Dry;Intact 09/23/21 1639  Line Status Flushed;Saline locked;Blood return noted 09/23/21 1639  Dressing Type Transparent;Securing device 09/23/21 1639  Dressing Status Clean;Dry;Intact 09/23/21 1639  Antimicrobial disc in place? Yes 09/23/21 1639  Safety Lock Not Applicable 09/23/21 1639  Line Care Connections checked and tightened 09/23/21 1639  Dressing Intervention New dressing;Securement device changed;Antimicrobial disc changed 09/23/21 1639  Dressing Change Due 09/30/21 09/23/21 1639       Elliot Dally 09/23/2021, 4:52 PM

## 2021-09-23 NOTE — Progress Notes (Signed)
Mobility Specialist - Progress Note   09/23/21 1200  Mobility  Activity Ambulated in hall  Level of Assistance Standby assist, set-up cues, supervision of patient - no hands on  Assistive Device Other (Comment) (IV pole)  Distance Ambulated (ft) 640 ft  Mobility Ambulated with assistance in hallway  Mobility Response Tolerated well  Mobility performed by Mobility specialist  $Mobility charge 1 Mobility    Pt ambulated to bathroom for urinal output prior to ambulation in hallway.    Filiberto Pinks Mobility Specialist 09/23/21, 12:29 PM

## 2021-09-23 NOTE — Progress Notes (Signed)
Secure chat with CSW and RN re PICC consent to be obtained by [place] RN from legal guardian.

## 2021-09-23 NOTE — TOC Progression Note (Addendum)
Transition of Care [facility]) - Progression Note    Patient Details  Name: Brian Dodson MRN: [id number] Date of Birth: [birth year]  [facility] CM/SW Contact  Liliana Cline, LCSW Phone Number: 09/23/2021, 1:19 PM  Clinical Narrative:   Requested by IV RN to reach out to DSS for consent for PICC line.  Per RN Chari Manning, patient needs: "Vascular Access Specialist Team  ..... Peripherally Inserted Central Catheter   .......long term antibiotic therapy". CSW requested call from on call SW with DSS.   1:40- Call from Anselmo Pickler - Director of [facility] DSS. Candace gave verbal consent for patient to get a PICC line. Verbal consent witnessed by this CSW and CSW [facility].   Expected Discharge Plan: Assisted Living Barriers to Discharge: Continued Medical Work up  Expected Discharge Plan and Services Expected Discharge Plan: Assisted Living   Discharge Planning Services: CM Consult Post Acute Care Choice: Resumption of Svcs/PTA Provider Living arrangements for the past 2 months: Assisted Living Facility                 DME Arranged: N/A DME Agency: NA       HH Arranged: NA           Social Determinants of Health (SDOH) Interventions    Readmission Risk Interventions Readmission Risk Prevention Plan 09/19/2021  Transportation Screening Complete  PCP or Specialist Appt within 3-5 Days Complete  HRI or Home Care Consult Complete  Social Work Consult for Recovery Care Planning/Counseling Complete  Palliative Care Screening Not Applicable  Medication Review Oceanographer) Complete  Some recent data might be hidden

## 2021-09-23 NOTE — Progress Notes (Signed)
PROGRESS NOTE    Brian Dodson  [id number] DOB: 11/07/1956 DOA: 09/17/2021 PCP: [name], MD    Brief Narrative:  65 y.o. male with medical history significant for paroxysmal atrial fibrillation on Eliquis, hypertension, type II DM, CAD, cognitive impairment, schizophrenia, recent hospitalization from 07/22/2021 to 07/26/2021 for severe sepsis from group B strep bacteremia, recent discharge from the hospital on 08/18/2021 after hospitalization for left leg cellulitis.   He was brought to the hospital because of fever and altered mental status.  His temperature was 103.3 F in the emergency room.  Patient was Streptococcus group G bacteremia.  Source is unclear.  This is a recurrent event.  Status post TEE on 11/2 negative for endocarditis.  Source remains unclear.  Consideration for GI source.  GI consulted.  Reached legal guardian who consented to colonoscopy.   C-scope completed 11/4.  1 polyp removed.  Per ID patient will require long-term IV antibiotics.  4 weeks of Rocephin.  Will need PICC line unfortunately as of 11/5 we are unable to get a hold of patient's legal guardian for consent purposes.  Assessment & Plan:   Active Problems:   Diabetes mellitus, type II ([place])   HTN (hypertension)   CAD (coronary artery disease)   Sepsis (HCC)   Anemia of chronic disease   Acute metabolic encephalopathy   AF (paroxysmal atrial fibrillation) (HCC)   AKI (acute kidney injury) ([place])   Streptococcal bacteremia  Severe sepsis secondary to Streptococcus bacteremia Source of bacteremia is unclear This is a recurrent event TTE negative CSF negative Stable on penicillin TEE negative Source remains unclear Colonoscopy negative, one 9 mm polyp removed Plan: Continue penicillin G infusion  On discharge patient will need 4 weeks of antibiotics, ceftriaxone 2 g daily until 11/30 Will need PICC line, unfortunately cannot reach patient's legal guardian for consent PICC line delayed as a  result.  Will reengage PICC team if we are able to reach legal guardian  Paroxysmal atrial fibrillation Eliquis restarted 11/5  Schizophrenia Cognitive impairment Mental status appears to be at baseline Continue current psychiatric regimen  Acute metabolic encephalopathy Resolved  Acute kidney injury Resolved  Type 2 diabetes mellitus with hyperglycemia Blood glucose stable Continue current regimen  Hypertension BP reasonably well controlled Continue current regimen   DVT prophylaxis: SCD Code Status: Full Family Communication: None today Disposition Plan: Status is: Inpatient  Remains inpatient appropriate because: Needs PICC line for long-term IV antibiotics.  Unable to reach legal guardian for consent    Level of care: Med-Surg  Consultants:  ID GI  Procedures:  TEE 11/2 Colonoscopy 11/4  Antimicrobials: Penicillin   Subjective: Seen and examined.  Sitting comfortably in chair.  No visible distress.  Objective: Vitals:   09/22/21 1727 09/22/21 2053 09/23/21 0317 09/23/21 0821  BP: 134/88 132/85 134/83 (!) 120/94  Pulse: 73 70 63 68  Resp: 17 16 16 16   Temp: 98.2 F (36.8 C) 98.2 F (36.8 C) (!) 97.5 F (36.4 C) 98.4 F (36.9 C)  TempSrc: Oral  Oral Oral  SpO2: 100% 100% 100% 99%  Weight:      Height:        Intake/Output Summary (Last 24 hours) at 09/23/2021 1031 Last data filed at 09/23/2021 0606 Gross per 24 hour  Intake 2382.97 ml  Output 1300 ml  Net 1082.97 ml   Filed Weights   09/17/21 0223 09/18/21 1951  Weight: 102.8 kg 105.8 kg    Examination:  General exam: No apparent distress.  Sitting up in chair Respiratory system: Clear lungs.  Normal work of breathing.  Room air Cardiovascular system: S1-S2, RRR, no murmurs Gastrointestinal system: Soft, NT/ND, normal bowel sounds Central nervous system: Alert, oriented x2, no focal deficits Extremities: Symmetric 5 x 5 power. Skin: No rashes, lesions or ulcers Psychiatry:  Judgement and insight appear impaired. Mood & affect confused.     Data Reviewed: I have personally reviewed following labs and imaging studies  CBC: Recent Labs  Lab 09/17/21 0200 09/18/21 0936 09/21/21 0736 09/22/21 1854 09/23/21 0131  WBC 10.3 8.1 6.0  --   --   NEUTROABS 8.6* 4.7 1.8  --   --   HGB 10.3* 10.3* 9.1* 10.0* 9.8*  HCT 30.6* 30.3* 28.1*  --   --   MCV 102.3* 101.0* 102.9*  --   --   PLT 121* 104* 124*  --   --    Basic Metabolic Panel: Recent Labs  Lab 09/17/21 0200 09/18/21 0936 09/19/21 0539 09/21/21 0736  NA 136 135  --  136  K 4.0 3.9  --  4.6  CL 101 102  --  104  CO2 26 28  --  26  GLUCOSE 108* 105*  --  99  BUN 25* 19  --  16  CREATININE 1.50* 1.06 0.97 0.95  CALCIUM 8.9 8.4*  --  8.6*   GFR: Estimated Creatinine Clearance: 99.1 mL/min (by C-G formula based on SCr of 0.95 mg/dL). Liver Function Tests: Recent Labs  Lab 09/17/21 0200  AST 22  ALT 15  ALKPHOS 44  BILITOT 0.7  PROT 7.1  ALBUMIN 3.4*   Recent Labs  Lab 09/17/21 0200  LIPASE 35   No results for input(s): AMMONIA in the last 168 hours. Coagulation Profile: Recent Labs  Lab 09/17/21 0200 09/18/21 0936  INR 1.2 1.0   Cardiac Enzymes: No results for input(s): CKTOTAL, CKMB, CKMBINDEX, TROPONINI in the last 168 hours. BNP (last 3 results) No results for input(s): PROBNP in the last 8760 hours. HbA1C: No results for input(s): HGBA1C in the last 72 hours. CBG: Recent Labs  Lab 09/22/21 1637 09/22/21 2124 09/23/21 0014 09/23/21 0400 09/23/21 0755  GLUCAP 90 111* 89 78 79   Lipid Profile: No results for input(s): CHOL, HDL, LDLCALC, TRIG, CHOLHDL, LDLDIRECT in the last 72 hours. Thyroid Function Tests: No results for input(s): TSH, T4TOTAL, FREET4, T3FREE, THYROIDAB in the last 72 hours. Anemia Panel: No results for input(s): VITAMINB12, FOLATE, FERRITIN, TIBC, IRON, RETICCTPCT in the last 72 hours. Sepsis Labs: Recent Labs  Lab 09/17/21 0200  09/18/21 0936  PROCALCITON 0.24 0.54  LATICACIDVEN 1.7  --     Recent Results (from the past 240 hour(s))  Blood Culture (routine x 2)     Status: Abnormal   Collection Time: 09/17/21  2:00 AM   Specimen: BLOOD  Result Value Ref Range Status   Specimen Description   Final    BLOOD RAC Performed at [facility], [address], [place], [place] [address]    Special Requests   Final    BOTTLES DRAWN AEROBIC AND ANAEROBIC Blood Culture adequate volume Performed at [facility], [address] [place], [place] [address]    Culture  Setup Time   Final    GRAM POSITIVE COCCI IN BOTH AEROBIC AND ANAEROBIC BOTTLES CRITICAL RESULT CALLED TO, READ BACK BY AND VERIFIED WITH: WALID NAZARI 09/17/21 1426 AMK    Culture STREPTOCOCCUS GROUP G (A)  Final   Report Status 09/19/2021  FINAL  Final   Organism ID, Bacteria STREPTOCOCCUS GROUP G  Final      Susceptibility   Streptococcus group g - MIC*    CLINDAMYCIN RESISTANT Resistant     AMPICILLIN <=0.25 SENSITIVE Sensitive     ERYTHROMYCIN >=8 RESISTANT Resistant     VANCOMYCIN 0.5 SENSITIVE Sensitive     CEFTRIAXONE <=0.12 SENSITIVE Sensitive     LEVOFLOXACIN 0.5 SENSITIVE Sensitive     PENICILLIN Value in next row Sensitive      SENSITIVE0.06    * STREPTOCOCCUS GROUP G  Blood Culture (routine x 2)     Status: Abnormal   Collection Time: 09/17/21  2:00 AM   Specimen: BLOOD  Result Value Ref Range Status   Specimen Description   Final    BLOOD RH Performed at [facility], [address], [place], [place] [address]    Special Requests   Final    BOTTLES DRAWN AEROBIC AND ANAEROBIC Blood Culture adequate volume Performed at [facility], [address] [place], [place] [address]    Culture  Setup Time   Final    GRAM POSITIVE COCCI IN BOTH AEROBIC AND ANAEROBIC BOTTLES CRITICAL RESULT CALLED TO, READ BACK BY AND VERIFIED WITH: WALID NAZARI 09/17/21 1426 AMK    Culture (A)  Final     STREPTOCOCCUS GROUP G SUSCEPTIBILITIES PERFORMED ON PREVIOUS CULTURE WITHIN THE LAST 5 DAYS. Performed at [facility], [place] [address], [place], [place] [address]    Report Status 09/19/2021 FINAL  Final  Urine Culture     Status: None   Collection Time: 09/17/21  2:00 AM   Specimen: In/Out Cath Urine  Result Value Ref Range Status   Specimen Description   Final    IN/OUT CATH URINE Performed at [facility], [address], [place], [place] [address]    Special Requests   Final    NONE Performed at [facility], [address] [place], [place] [address]    Culture   Final    NO GROWTH Performed at [facility], [place] [address], [place], [place] [address]    Report Status 09/18/2021 FINAL  Final  Resp Panel by RT-PCR (Flu A&B, Covid) Nasopharyngeal Swab     Status: None   Collection Time: 09/17/21  2:00 AM   Specimen: Nasopharyngeal Swab; Nasopharyngeal(NP) swabs in vial transport medium  Result Value Ref Range Status   SARS Coronavirus 2 by RT PCR NEGATIVE NEGATIVE Final    Comment: (NOTE) SARS-CoV-2 target nucleic acids are NOT DETECTED.  The SARS-CoV-2 RNA is generally detectable in upper respiratory specimens during the acute phase of infection. The lowest concentration of SARS-CoV-2 viral copies this assay can detect is 138 copies/mL. A negative result does not preclude SARS-Cov-2 infection and should not be used as the sole basis for treatment or other patient management decisions. A negative result may occur with  improper specimen collection/handling, submission of specimen other than nasopharyngeal swab, presence of viral mutation(s) within the areas targeted by this assay, and inadequate number of viral copies(<138 copies/mL). A negative result must be combined with clinical observations, patient history, and epidemiological information. The expected result is Negative.  Fact Sheet for Patients:   EntrepreneurPulse.com.au  Fact Sheet for Healthcare Providers:  IncredibleEmployment.be  This test is no t yet approved or cleared by the [place] FDA and  has been authorized for detection and/or diagnosis of SARS-CoV-2 by FDA under an Emergency Use Authorization (EUA). This  EUA will remain  in effect (meaning this test can be used) for the duration of the COVID-19 declaration under Section 564(b)(1) of the Act, 21 U.S.C.section 360bbb-3(b)(1), unless the authorization is terminated  or revoked sooner.       Influenza A by PCR NEGATIVE NEGATIVE Final   Influenza B by PCR NEGATIVE NEGATIVE Final    Comment: (NOTE) The Xpert Xpress SARS-CoV-2/FLU/RSV plus assay is intended as an aid in the diagnosis of influenza from Nasopharyngeal swab specimens and should not be used as a sole basis for treatment. Nasal washings and aspirates are unacceptable for Xpert Xpress SARS-CoV-2/FLU/RSV testing.  Fact Sheet for Patients: EntrepreneurPulse.com.au  Fact Sheet for Healthcare Providers: IncredibleEmployment.be  This test is not yet approved or cleared by the [place] FDA and has been authorized for detection and/or diagnosis of SARS-CoV-2 by FDA under an Emergency Use Authorization (EUA). This EUA will remain in effect (meaning this test can be used) for the duration of the COVID-19 declaration under Section 564(b)(1) of the Act, 21 U.S.C. section 360bbb-3(b)(1), unless the authorization is terminated or revoked.  Performed at [facility], [address] [place], [place] [address]   Blood Culture ID Panel (Reflexed)     Status: Abnormal   Collection Time: 09/17/21  2:00 AM  Result Value Ref Range Status   Enterococcus faecalis NOT DETECTED NOT DETECTED Final   Enterococcus Faecium NOT DETECTED NOT DETECTED Final   Listeria monocytogenes NOT DETECTED NOT DETECTED Final   Staphylococcus  species NOT DETECTED NOT DETECTED Final   Staphylococcus aureus (BCID) NOT DETECTED NOT DETECTED Final   Staphylococcus epidermidis NOT DETECTED NOT DETECTED Final   Staphylococcus lugdunensis NOT DETECTED NOT DETECTED Final   Streptococcus species DETECTED (A) NOT DETECTED Final    Comment: Not Enterococcus species, Streptococcus agalactiae, Streptococcus pyogenes, or Streptococcus pneumoniae. CRITICAL RESULT CALLED TO, READ BACK BY AND VERIFIED WITH: WALID NAZARI 09/17/21 1426 AMK    Streptococcus agalactiae NOT DETECTED NOT DETECTED Final   Streptococcus pneumoniae NOT DETECTED NOT DETECTED Final   Streptococcus pyogenes NOT DETECTED NOT DETECTED Final   A.calcoaceticus-baumannii NOT DETECTED NOT DETECTED Final   Bacteroides fragilis NOT DETECTED NOT DETECTED Final   Enterobacterales NOT DETECTED NOT DETECTED Final   Enterobacter cloacae complex NOT DETECTED NOT DETECTED Final   Escherichia coli NOT DETECTED NOT DETECTED Final   Klebsiella aerogenes NOT DETECTED NOT DETECTED Final   Klebsiella oxytoca NOT DETECTED NOT DETECTED Final   Klebsiella pneumoniae NOT DETECTED NOT DETECTED Final   Proteus species NOT DETECTED NOT DETECTED Final   Salmonella species NOT DETECTED NOT DETECTED Final   Serratia marcescens NOT DETECTED NOT DETECTED Final   Haemophilus influenzae NOT DETECTED NOT DETECTED Final   Neisseria meningitidis NOT DETECTED NOT DETECTED Final   Pseudomonas aeruginosa NOT DETECTED NOT DETECTED Final   Stenotrophomonas maltophilia NOT DETECTED NOT DETECTED Final   Candida albicans NOT DETECTED NOT DETECTED Final   Candida auris NOT DETECTED NOT DETECTED Final   Candida glabrata NOT DETECTED NOT DETECTED Final   Candida krusei NOT DETECTED NOT DETECTED Final   Candida parapsilosis NOT DETECTED NOT DETECTED Final   Candida tropicalis NOT DETECTED NOT DETECTED Final   Cryptococcus neoformans/gattii NOT DETECTED NOT DETECTED Final    Comment: Performed at [facility], [address] [place], [place] [address]  CSF culture w Gram Stain     Status: None   Collection Time: 09/18/21  2:14 PM   Specimen: PATH Cytology CSF; Cerebrospinal Fluid  Result Value Ref Range Status   Specimen Description   Final    CSF Performed at [facility], [address] [place], [place] [address]    Special Requests   Final    NONE Performed at [facility], [address] [place], [place] [address]    Gram Stain   Final    WBC PRESENT, PREDOMINANTLY MONONUCLEAR NO ORGANISMS SEEN CYTOSPIN SMEAR    Culture   Final    NO GROWTH Performed at [facility], [place] [address], [place], [place] [address]    Report Status 09/22/2021 FINAL  Final  CULTURE, BLOOD (ROUTINE X 2) w Reflex to ID Panel     Status: None (Preliminary result)   Collection Time: 09/20/21  6:50 AM   Specimen: BLOOD  Result Value Ref Range Status   Specimen Description BLOOD RIGHT ARM  Final   Special Requests   Final    BOTTLES DRAWN AEROBIC AND ANAEROBIC Blood Culture adequate volume   Culture   Final    NO GROWTH 3 DAYS Performed at [facility], [address], [place], [place] [address]    Report Status PENDING  Incomplete  CULTURE, BLOOD (ROUTINE X 2) w Reflex to ID Panel     Status: None (Preliminary result)   Collection Time: 09/20/21  6:55 AM   Specimen: BLOOD  Result Value Ref Range Status   Specimen Description BLOOD RIGHT HAND  Final   Special Requests   Final    BOTTLES DRAWN AEROBIC AND ANAEROBIC Blood Culture adequate volume   Culture   Final    NO GROWTH 3 DAYS Performed at [facility], [address] [place], [place] [address]    Report Status PENDING  Incomplete         Radiology Studies: [place] EKG SITE RITE  Result Date: 09/23/2021 If Site Rite image not attached, placement could not be confirmed due to current cardiac rhythm.       Scheduled Meds:  amiodarone  200 mg Oral Daily   apixaban  5 mg Oral BID    aspirin EC  81 mg Oral Daily   atorvastatin  10 mg Oral Daily   benztropine  1 mg Oral QHS   brimonidine  1 drop Both Eyes BID   And   timolol  1 drop Both Eyes BID   cholecalciferol  2,000 Units Oral Daily   citalopram  20 mg Oral Daily   divalproex  2,000 mg Oral QHS   donepezil  10 mg Oral QHS   ferrous sulfate  325 mg Oral Q breakfast   insulin aspart  0-15 Units Subcutaneous Q4H   latanoprost  1 drop Both Eyes QHS   melatonin  5 mg Oral QHS   multivitamin with minerals  1 tablet Oral Daily   pantoprazole  40 mg Oral BID   Ensure Max Protein  11 oz Oral BID   risperidone  4 mg Oral QHS   Continuous Infusions:  penicillin g continuous IV infusion 12 Million Units (09/23/21 0850)     LOS: 6 days    Time spent: 25 minutes    Sidney Ace, MD [facility]   If 7PM-7AM, please contact night-coverage  09/23/2021, 10:31 AM

## 2021-09-23 NOTE — Progress Notes (Signed)
PICC order received for OPAT. Unable to contact legal guardian for consent.  Dr Georgeann Oppenheim and  Marcene Brawn RN aware of multiple attempts to contact- 4 different phone numbers attempted.  Dr Georgeann Oppenheim aware that the PICC may not be placed until Monday, earlier if staff is able to contact [employer] for consent. Pt currently has PIV access placed early this am for IV med needs.

## 2021-09-24 DIAGNOSIS — R7881 Bacteremia: Secondary | ICD-10-CM | POA: Diagnosis not present

## 2021-09-24 DIAGNOSIS — B955 Unspecified streptococcus as the cause of diseases classified elsewhere: Secondary | ICD-10-CM | POA: Diagnosis not present

## 2021-09-24 LAB — GLUCOSE, CAPILLARY
Glucose-Capillary: 100 mg/dL — ABNORMAL HIGH (ref 70–99)
Glucose-Capillary: 113 mg/dL — ABNORMAL HIGH (ref 70–99)
Glucose-Capillary: 119 mg/dL — ABNORMAL HIGH (ref 70–99)
Glucose-Capillary: 125 mg/dL — ABNORMAL HIGH (ref 70–99)
Glucose-Capillary: 154 mg/dL — ABNORMAL HIGH (ref 70–99)
Glucose-Capillary: 91 mg/dL (ref 70–99)

## 2021-09-24 NOTE — Progress Notes (Signed)
PROGRESS NOTE    Brian Dodson  [id number] DOB: November 30, 1955 DOA: 09/17/2021 PCP: [name], MD    Brief Narrative:  65 y.o. male with medical history significant for paroxysmal atrial fibrillation on Eliquis, hypertension, type II DM, CAD, cognitive impairment, schizophrenia, recent hospitalization from 07/22/2021 to 07/26/2021 for severe sepsis from group B strep bacteremia, recent discharge from the hospital on 08/18/2021 after hospitalization for left leg cellulitis.   He was brought to the hospital because of fever and altered mental status.  His temperature was 103.3 F in the emergency room.  Patient was Streptococcus group G bacteremia.  Source is unclear.  This is a recurrent event.  Status post TEE on 11/2 negative for endocarditis.  Source remains unclear.  Consideration for GI source.  GI consulted.  Reached legal guardian who consented to colonoscopy.   C-scope completed 11/4.  1 polyp removed.  Per ID patient will require long-term IV antibiotics.  4 weeks of Rocephin.  Legal guardian reached.  PICC line placed successfully 11/5.  Placement confirmed by imaging  Assessment & Plan:   Active Problems:   Diabetes mellitus, type II ([place])   HTN (hypertension)   CAD (coronary artery disease)   Sepsis ([place])   Anemia of chronic disease   Acute metabolic encephalopathy   AF (paroxysmal atrial fibrillation) (H[facility])   AKI (acute kidney injury) ([place])   Streptococcal bacteremia  Severe sepsis secondary to Streptococcus bacteremia Source of bacteremia is unclear This is a recurrent event TTE negative CSF negative Stable on penicillin TEE negative Source remains unclear Colonoscopy negative, one 9 mm polyp removed PICC line placed 11/5 Plan: Continue penicillin G infusion  On discharge patient will need 4 weeks of antibiotics, ceftriaxone 2 g daily until 11/30 PICC line in place Medically stable for discharge  Paroxysmal atrial fibrillation Eliquis restarted  11/5  Schizophrenia Cognitive impairment Mental status appears to be at baseline Continue current psychiatric regimen  Acute metabolic encephalopathy Resolved  Acute kidney injury Resolved  Type 2 diabetes mellitus with hyperglycemia Blood glucose stable Continue current regimen  Hypertension BP reasonably well controlled Continue current regimen   DVT prophylaxis: SCD Code Status: Full Family Communication: None today Disposition Plan: Status is: Inpatient  Remains inpatient appropriate because: Unsafe discharge plan.  PICC line in place.  Patient medically stable at this time    Level of care: Med-Surg  Consultants:  ID GI  Procedures:  TEE 11/2 Colonoscopy 11/4 PICC line 11/5  Antimicrobials: Penicillin   Subjective: Seen and examined.  Sitting comfortably in chair.  No visible distress.  Objective: Vitals:   09/23/21 2015 09/23/21 2144 09/24/21 0445 09/24/21 0811  BP: 114/76 104/65 98/68 108/81  Pulse: 93 85 68 66  Resp: 16 16 15 20   Temp: 98.2 F (36.8 C) 98.1 F (36.7 C) 97.7 F (36.5 C) 97.9 F (36.6 C)  TempSrc: Oral Oral Oral Oral  SpO2: 99% 99% 100% 100%  Weight:      Height:        Intake/Output Summary (Last 24 hours) at 09/24/2021 1142 Last data filed at 09/24/2021 1031 Gross per 24 hour  Intake 2123.67 ml  Output --  Net 2123.67 ml   Filed Weights   09/17/21 0223 09/18/21 1951  Weight: 102.8 kg 105.8 kg    Examination:  General exam: No apparent distress.  Sitting up in chair Respiratory system: Clear lungs.  Normal work of breathing.  Room air Cardiovascular system: S1-S2, RRR, no murmurs Gastrointestinal system: Soft, NT/ND, normal  bowel sounds Central nervous system: Alert, oriented x2, no focal deficits Extremities: Symmetric 5 x 5 power. Skin: No rashes, lesions or ulcers Psychiatry: Judgement and insight appear impaired. Mood & affect confused.     Data Reviewed: I have personally reviewed following labs and  imaging studies  CBC: Recent Labs  Lab 09/18/21 0936 09/21/21 0736 09/22/21 1854 09/23/21 0131  WBC 8.1 6.0  --   --   NEUTROABS 4.7 1.8  --   --   HGB 10.3* 9.1* 10.0* 9.8*  HCT 30.3* 28.1*  --   --   MCV 101.0* 102.9*  --   --   PLT 104* 124*  --   --    Basic Metabolic Panel: Recent Labs  Lab 09/18/21 0936 09/19/21 0539 09/21/21 0736  NA 135  --  136  K 3.9  --  4.6  CL 102  --  104  CO2 28  --  26  GLUCOSE 105*  --  99  BUN 19  --  16  CREATININE 1.06 0.97 0.95  CALCIUM 8.4*  --  8.6*   GFR: Estimated Creatinine Clearance: 99.1 mL/min (by C-G formula based on SCr of 0.95 mg/dL). Liver Function Tests: No results for input(s): AST, ALT, ALKPHOS, BILITOT, PROT, ALBUMIN in the last 168 hours.  No results for input(s): LIPASE, AMYLASE in the last 168 hours.  No results for input(s): AMMONIA in the last 168 hours. Coagulation Profile: Recent Labs  Lab 09/18/21 0936  INR 1.0   Cardiac Enzymes: No results for input(s): CKTOTAL, CKMB, CKMBINDEX, TROPONINI in the last 168 hours. BNP (last 3 results) No results for input(s): PROBNP in the last 8760 hours. HbA1C: No results for input(s): HGBA1C in the last 72 hours. CBG: Recent Labs  Lab 09/23/21 1201 09/23/21 1547 09/23/21 2017 09/24/21 0043 09/24/21 0415  GLUCAP 228* 105* 171* 125* 113*   Lipid Profile: No results for input(s): CHOL, HDL, LDLCALC, TRIG, CHOLHDL, LDLDIRECT in the last 72 hours. Thyroid Function Tests: No results for input(s): TSH, T4TOTAL, FREET4, T3FREE, THYROIDAB in the last 72 hours. Anemia Panel: No results for input(s): VITAMINB12, FOLATE, FERRITIN, TIBC, IRON, RETICCTPCT in the last 72 hours. Sepsis Labs: Recent Labs  Lab 09/18/21 0936  PROCALCITON 0.54    Recent Results (from the past 240 hour(s))  Blood Culture (routine x 2)     Status: Abnormal   Collection Time: 09/17/21  2:00 AM   Specimen: BLOOD  Result Value Ref Range Status   Specimen Description   Final    BLOOD  RAC Performed at [facility], [address] [place], [place] [address]    Special Requests   Final    BOTTLES DRAWN AEROBIC AND ANAEROBIC Blood Culture adequate volume Performed at [facility], [address] [place], [place] [address]    Culture  Setup Time   Final    GRAM POSITIVE COCCI IN BOTH AEROBIC AND ANAEROBIC BOTTLES CRITICAL RESULT CALLED TO, READ BACK BY AND VERIFIED WITH: WALID NAZARI 09/17/21 1426 AMK    Culture STREPTOCOCCUS GROUP G (A)  Final   Report Status 09/19/2021 FINAL  Final   Organism ID, Bacteria STREPTOCOCCUS GROUP G  Final      Susceptibility   Streptococcus group g - MIC*    CLINDAMYCIN RESISTANT Resistant     AMPICILLIN <=0.25 SENSITIVE Sensitive     ERYTHROMYCIN >=8 RESISTANT Resistant     VANCOMYCIN 0.5 SENSITIVE Sensitive     CEFTRIAXONE <=0.12 SENSITIVE Sensitive  LEVOFLOXACIN 0.5 SENSITIVE Sensitive     PENICILLIN Value in next row Sensitive      SENSITIVE0.06    * STREPTOCOCCUS GROUP G  Blood Culture (routine x 2)     Status: Abnormal   Collection Time: 09/17/21  2:00 AM   Specimen: BLOOD  Result Value Ref Range Status   Specimen Description   Final    BLOOD RH Performed at [facility], [address], [place], [place] [address]    Special Requests   Final    BOTTLES DRAWN AEROBIC AND ANAEROBIC Blood Culture adequate volume Performed at [facility], [address] [place], [place] [address]    Culture  Setup Time   Final    GRAM POSITIVE COCCI IN BOTH AEROBIC AND ANAEROBIC BOTTLES CRITICAL RESULT CALLED TO, READ BACK BY AND VERIFIED WITH: WALID NAZARI 09/17/21 1426 AMK    Culture (A)  Final    STREPTOCOCCUS GROUP G SUSCEPTIBILITIES PERFORMED ON PREVIOUS CULTURE WITHIN THE LAST 5 DAYS. Performed at [facility], [address], [place], [place] [address]    Report Status 09/19/2021 FINAL  Final  Urine Culture     Status: None   Collection Time: 09/17/21  2:00 AM   Specimen:  In/Out Cath Urine  Result Value Ref Range Status   Specimen Description   Final    IN/OUT CATH URINE Performed at [facility], [address] [place], [place] [address]    Special Requests   Final    NONE Performed at [facility], [address] [place], [place] [address]    Culture   Final    NO GROWTH Performed at [facility], [address], [place], [place] [address]    Report Status 09/18/2021 FINAL  Final  Resp Panel by RT-PCR (Flu A&B, Covid) Nasopharyngeal Swab     Status: None   Collection Time: 09/17/21  2:00 AM   Specimen: Nasopharyngeal Swab; Nasopharyngeal(NP) swabs in vial transport medium  Result Value Ref Range Status   SARS Coronavirus 2 by RT PCR NEGATIVE NEGATIVE Final    Comment: (NOTE) SARS-CoV-2 target nucleic acids are NOT DETECTED.  The SARS-CoV-2 RNA is generally detectable in upper respiratory specimens during the acute phase of infection. The lowest concentration of SARS-CoV-2 viral copies this assay can detect is 138 copies/mL. A negative result does not preclude SARS-Cov-2 infection and should not be used as the sole basis for treatment or other patient management decisions. A negative result may occur with  improper specimen collection/handling, submission of specimen other than nasopharyngeal swab, presence of viral mutation(s) within the areas targeted by this assay, and inadequate number of viral copies(<138 copies/mL). A negative result must be combined with clinical observations, patient history, and epidemiological information. The expected result is Negative.  Fact Sheet for Patients:  BloggerCourse.com  Fact Sheet for Healthcare Providers:  SeriousBroker.it  This test is no t yet approved or cleared by the [place] FDA and  has been authorized for detection and/or diagnosis of SARS-CoV-2 by FDA under an Emergency Use Authorization (EUA). This EUA will  remain  in effect (meaning this test can be used) for the duration of the COVID-19 declaration under Section 564(b)(1) of the Act, 21 U.S.C.section 360bbb-3(b)(1), unless the authorization is terminated  or revoked sooner.       Influenza A by PCR NEGATIVE NEGATIVE Final   Influenza B by PCR NEGATIVE NEGATIVE Final    Comment: (NOTE) The Xpert Xpress SARS-CoV-2/FLU/RSV  plus assay is intended as an aid in the diagnosis of influenza from Nasopharyngeal swab specimens and should not be used as a sole basis for treatment. Nasal washings and aspirates are unacceptable for Xpert Xpress SARS-CoV-2/FLU/RSV testing.  Fact Sheet for Patients: EntrepreneurPulse.com.au  Fact Sheet for Healthcare Providers: IncredibleEmployment.be  This test is not yet approved or cleared by the [place] FDA and has been authorized for detection and/or diagnosis of SARS-CoV-2 by FDA under an Emergency Use Authorization (EUA). This EUA will remain in effect (meaning this test can be used) for the duration of the COVID-19 declaration under Section 564(b)(1) of the Act, 21 U.S.C. section 360bbb-3(b)(1), unless the authorization is terminated or revoked.  Performed at [facility], [address] [place], [place] [address]   Blood Culture ID Panel (Reflexed)     Status: Abnormal   Collection Time: 09/17/21  2:00 AM  Result Value Ref Range Status   Enterococcus faecalis NOT DETECTED NOT DETECTED Final   Enterococcus Faecium NOT DETECTED NOT DETECTED Final   Listeria monocytogenes NOT DETECTED NOT DETECTED Final   Staphylococcus species NOT DETECTED NOT DETECTED Final   Staphylococcus aureus (BCID) NOT DETECTED NOT DETECTED Final   Staphylococcus epidermidis NOT DETECTED NOT DETECTED Final   Staphylococcus lugdunensis NOT DETECTED NOT DETECTED Final   Streptococcus species DETECTED (A) NOT DETECTED Final    Comment: Not Enterococcus species, Streptococcus  agalactiae, Streptococcus pyogenes, or Streptococcus pneumoniae. CRITICAL RESULT CALLED TO, READ BACK BY AND VERIFIED WITH: WALID NAZARI 09/17/21 1426 AMK    Streptococcus agalactiae NOT DETECTED NOT DETECTED Final   Streptococcus pneumoniae NOT DETECTED NOT DETECTED Final   Streptococcus pyogenes NOT DETECTED NOT DETECTED Final   A.calcoaceticus-baumannii NOT DETECTED NOT DETECTED Final   Bacteroides fragilis NOT DETECTED NOT DETECTED Final   Enterobacterales NOT DETECTED NOT DETECTED Final   Enterobacter cloacae complex NOT DETECTED NOT DETECTED Final   Escherichia coli NOT DETECTED NOT DETECTED Final   Klebsiella aerogenes NOT DETECTED NOT DETECTED Final   Klebsiella oxytoca NOT DETECTED NOT DETECTED Final   Klebsiella pneumoniae NOT DETECTED NOT DETECTED Final   Proteus species NOT DETECTED NOT DETECTED Final   Salmonella species NOT DETECTED NOT DETECTED Final   Serratia marcescens NOT DETECTED NOT DETECTED Final   Haemophilus influenzae NOT DETECTED NOT DETECTED Final   Neisseria meningitidis NOT DETECTED NOT DETECTED Final   Pseudomonas aeruginosa NOT DETECTED NOT DETECTED Final   Stenotrophomonas maltophilia NOT DETECTED NOT DETECTED Final   Candida albicans NOT DETECTED NOT DETECTED Final   Candida auris NOT DETECTED NOT DETECTED Final   Candida glabrata NOT DETECTED NOT DETECTED Final   Candida krusei NOT DETECTED NOT DETECTED Final   Candida parapsilosis NOT DETECTED NOT DETECTED Final   Candida tropicalis NOT DETECTED NOT DETECTED Final   Cryptococcus neoformans/gattii NOT DETECTED NOT DETECTED Final    Comment: Performed at [facility], [address] [place], [place] [address]  CSF culture w Gram Stain     Status: None   Collection Time: 09/18/21  2:14 PM   Specimen: PATH Cytology CSF; Cerebrospinal Fluid  Result Value Ref Range Status   Specimen Description   Final    CSF Performed at [facility], [address], [place], [place] [address]     Special Requests   Final    NONE Performed at [facility], [address] [place], [place] [address]    Gram Stain   Final    WBC PRESENT, PREDOMINANTLY MONONUCLEAR  NO ORGANISMS SEEN CYTOSPIN SMEAR    Culture   Final    NO GROWTH Performed at [facility], [place] [address], [place], [place] [address]    Report Status 09/22/2021 FINAL  Final  CULTURE, BLOOD (ROUTINE X 2) w Reflex to ID Panel     Status: None (Preliminary result)   Collection Time: 09/20/21  6:50 AM   Specimen: BLOOD  Result Value Ref Range Status   Specimen Description BLOOD RIGHT ARM  Final   Special Requests   Final    BOTTLES DRAWN AEROBIC AND ANAEROBIC Blood Culture adequate volume   Culture   Final    NO GROWTH 4 DAYS Performed at [facility], [address] [place], [place] [address]    Report Status PENDING  Incomplete  CULTURE, BLOOD (ROUTINE X 2) w Reflex to ID Panel     Status: None (Preliminary result)   Collection Time: 09/20/21  6:55 AM   Specimen: BLOOD  Result Value Ref Range Status   Specimen Description BLOOD RIGHT HAND  Final   Special Requests   Final    BOTTLES DRAWN AEROBIC AND ANAEROBIC Blood Culture adequate volume   Culture   Final    NO GROWTH 4 DAYS Performed at [facility], [address], [place], [place] [address]    Report Status PENDING  Incomplete         Radiology Studies: DG Chest Port 1 View  Result Date: 09/23/2021 CLINICAL DATA:  PICC placement. EXAM: PORTABLE CHEST 1 VIEW COMPARISON:  Radiograph and CT 09/17/2021 FINDINGS: Tip of the right upper extremity PICC in the mid SVC. The cardiomediastinal contours are stable, upper normal heart size. The lungs are clear. Pulmonary vasculature is normal. No consolidation, pleural effusion, or pneumothorax. No acute osseous abnormalities are seen. IMPRESSION: Tip of the right upper extremity PICC in the mid SVC. Electronically Signed   By: Keith Rake M.D.   On: 09/23/2021 17:27    [place] EKG SITE RITE  Result Date: 09/23/2021 If Site Rite image not attached, placement could not be confirmed due to current cardiac rhythm.       Scheduled Meds:  amiodarone  200 mg Oral Daily   apixaban  5 mg Oral BID   aspirin EC  81 mg Oral Daily   atorvastatin  10 mg Oral Daily   benztropine  1 mg Oral QHS   brimonidine  1 drop Both Eyes BID   And   timolol  1 drop Both Eyes BID   Chlorhexidine Gluconate Cloth  6 each Topical Daily   cholecalciferol  2,000 Units Oral Daily   citalopram  20 mg Oral Daily   divalproex  2,000 mg Oral QHS   donepezil  10 mg Oral QHS   ferrous sulfate  325 mg Oral Q breakfast   insulin aspart  0-15 Units Subcutaneous Q4H   latanoprost  1 drop Both Eyes QHS   melatonin  5 mg Oral QHS   multivitamin with minerals  1 tablet Oral Daily   pantoprazole  40 mg Oral BID   Ensure Max Protein  11 oz Oral BID   risperidone  4 mg Oral QHS   sodium chloride flush  10-40 mL Intracatheter Q12H   Continuous Infusions:  penicillin g continuous IV infusion 12 Million Units (09/24/21 0933)     LOS: 7 days    Time spent: 15 minutes    Sidney Ace, MD [facility]   If 7PM-7AM, please  contact night-coverage  09/24/2021, 11:42 AM

## 2021-09-24 NOTE — Progress Notes (Signed)
Pt pulled PICC line. 44 cm intact gauze dressing applied to PICC site.

## 2021-09-25 ENCOUNTER — Encounter: Payer: Self-pay | Admitting: Gastroenterology

## 2021-09-25 ENCOUNTER — Inpatient Hospital Stay: Payer: Self-pay

## 2021-09-25 DIAGNOSIS — B955 Unspecified streptococcus as the cause of diseases classified elsewhere: Secondary | ICD-10-CM | POA: Diagnosis not present

## 2021-09-25 DIAGNOSIS — R7881 Bacteremia: Secondary | ICD-10-CM | POA: Diagnosis not present

## 2021-09-25 LAB — CULTURE, BLOOD (ROUTINE X 2)
Culture: NO GROWTH
Culture: NO GROWTH
Special Requests: ADEQUATE
Special Requests: ADEQUATE

## 2021-09-25 LAB — GLUCOSE, CAPILLARY
Glucose-Capillary: 111 mg/dL — ABNORMAL HIGH (ref 70–99)
Glucose-Capillary: 79 mg/dL (ref 70–99)
Glucose-Capillary: 124 mg/dL — ABNORMAL HIGH (ref 70–99)
Glucose-Capillary: 119 mg/dL — ABNORMAL HIGH (ref 70–99)
Glucose-Capillary: 216 mg/dL — ABNORMAL HIGH (ref 70–99)

## 2021-09-25 MED ORDER — PENICILLIN G POTASSIUM 20000000 UNITS IJ SOLR: 12.0000 10*6.[IU] | Freq: Two times a day (BID) | INTRAVENOUS | Status: DC

## 2021-09-25 NOTE — Treatment Plan (Signed)
Diagnosis: Streptococcus group G bacteremia Baseline Creatinine <1    Allergies  Allergen Reactions   Sulfa Antibiotics Other (See Comments)    Unknown reaction   Xarelto [Rivaroxaban]     OPAT Orders Discharge antibiotics: Ceftriaxone 2 grams IV every 24 hrs Duration: 4 weeks End Date: 10/18/21  [facility] Care Per Protocol:  Labs weekly every monday while on IV antibiotics: _X_ CBC with differential  _X_ CMP   _X_ Please pull PIC at completion of IV antibiotics _ Fax weekly labs to  Dr.Dallyn Bergland [phone]  Clinic Follow Up Appt: 10/10/21 @ 12.15   Call [phone] with any critical values or any questions

## 2021-09-25 NOTE — TOC Progression Note (Addendum)
Transition of Care [facility]) - Progression Note    Patient Details  Name: Brian Dodson MRN: [id number] Date of Birth: [birth year]  Transition of [facility]) CM/SW Contact  [name], LCSW Phone Number: 09/25/2021, 11:08 AM  Clinical Narrative: Bed offers from [facility] and [facility] still pending. Emailed to DSS supervisor [name] to see if she has a preference between the two. Also included current bed offers to see if she has a preference between those. PASARR still under level 2 review.    1:50 pm: [facility] is able to offer a bed. Emailed Milinda Pointer to notify.  2:52 pm: Updated Tammy from [facility].  Expected Discharge Plan: Assisted Living Barriers to Discharge: Continued Medical Work up  Expected Discharge Plan and Services Expected Discharge Plan: Assisted Living   Discharge Planning Services: CM Consult Post Acute Care Choice: Resumption of Svcs/PTA Provider Living arrangements for the past 2 months: Assisted Living Facility                 DME Arranged: N/A DME Agency: NA       HH Arranged: NA           Social Determinants of Health (SDOH) Interventions    Readmission Risk Interventions Readmission Risk Prevention Plan 09/19/2021  Transportation Screening Complete  PCP or Specialist Appt within 3-5 Days Complete  HRI or Home Care Consult Complete  Social Work Consult for Recovery Care Planning/Counseling Complete  Palliative Care Screening Not Applicable  Medication Review Ocean[facility]) Complete  Some recent data might be hidden

## 2021-09-25 NOTE — Progress Notes (Signed)
Peripherally Inserted Central Catheter Placement  The IV Nurse has discussed with the patient and/or persons authorized to consent for the patient, the purpose of this procedure and the potential benefits and risks involved with this procedure.  The benefits include less needle sticks, lab draws from the catheter, and the patient may be discharged home with the catheter. Risks include, but not limited to, infection, bleeding, blood clot (thrombus formation), and puncture of an artery; nerve damage and irregular heartbeat and possibility to perform a PICC exchange if needed/ordered by physician.  Alternatives to this procedure were also discussed.  Bard Power PICC patient education guide, fact sheet on infection prevention and patient information card has been provided to patient /or left at bedside.    PICC Placement Documentation  PICC Double Lumen 09/25/21 PICC Right Brachial 43 cm 0 cm (Active)  Indication for Insertion or Continuance of Line Prolonged intravenous therapies 09/25/21 1557  Exposed Catheter (cm) 0 cm 09/25/21 1557  Site Assessment Clean;Dry;Intact 09/25/21 1557  Lumen #1 Status Blood return noted;Saline locked;Flushed 09/25/21 1557  Lumen #2 Status Blood return noted;Saline locked;Flushed 09/25/21 1557  Dressing Type Transparent 09/25/21 1557  Dressing Status Clean;Dry;Intact 09/25/21 1557  Antimicrobial disc in place? Yes 09/25/21 1557  Dressing Intervention New dressing 09/25/21 1557  Dressing Change Due 10/02/21 09/25/21 1557       Reginia Forts [place]ce 09/25/2021, 4:00 PM

## 2021-09-25 NOTE — Progress Notes (Signed)
Patient self discontinued PiCC line last night 11/6 which was  inserted 11/5 by IV team.New PICC ordered to replace PICC ;spoke to primary RN regarding PICC replacement ,per RN no discharge date.Will follow up.

## 2021-09-25 NOTE — Progress Notes (Signed)
Date of Admission:  09/17/2021     ID: Brian Dodson is a 65 y.o. male Active Problems:   Diabetes mellitus, type II ([place])   HTN (hypertension)   CAD (coronary artery disease)   Sepsis ([place])   Anemia of chronic disease   Acute metabolic encephalopathy   AF (paroxysmal atrial fibrillation) (H[facility])   AKI (acute kidney injury) ([place])   Streptococcal bacteremia    Subjective: Pt sitting in chair Pleasantly confused No specific complaints PICC was removed by him and a new one placed today  Medications:   amiodarone  200 mg Oral Daily   apixaban  5 mg Oral BID   aspirin EC  81 mg Oral Daily   atorvastatin  10 mg Oral Daily   benztropine  1 mg Oral QHS   brimonidine  1 drop Both Eyes BID   And   timolol  1 drop Both Eyes BID   Chlorhexidine Gluconate Cloth  6 each Topical Daily   cholecalciferol  2,000 Units Oral Daily   citalopram  20 mg Oral Daily   divalproex  2,000 mg Oral QHS   donepezil  10 mg Oral QHS   ferrous sulfate  325 mg Oral Q breakfast   insulin aspart  0-15 Units Subcutaneous Q4H   latanoprost  1 drop Both Eyes QHS   melatonin  5 mg Oral QHS   multivitamin with minerals  1 tablet Oral Daily   pantoprazole  40 mg Oral BID   Ensure Max Protein  11 oz Oral BID   risperidone  4 mg Oral QHS   sodium chloride flush  10-40 mL Intracatheter Q12H    Objective: Vital signs in last 24 hours: Temp:  [97.4 F (36.3 C)-98.6 F (37 C)] 97.7 F (36.5 C) (11/07 0750) Pulse Rate:  [63-79] 66 (11/07 0750) Resp:  [16-21] 16 (11/07 0750) BP: (105-124)/(76-86) 113/76 (11/07 0750) SpO2:  [95 %-100 %] 95 % (11/07 0750)  PHYSICAL EXAM:  General: Alert, cooperative, no distress, appears stated age. Oriented in person Head: Normocephalic, without obvious abnormality, atraumatic. Eyes: Conjunctivae clear, anicteric sclerae. Pupils are equal ENT Nares normal. No drainage or sinus tenderness. Lips, mucosa, and tongue normal. No Thrush, edentulous Neck: Supple, symmetrical,  no adenopathy, thyroid: non tender no carotid bruit and no JVD. Back: No CVA tenderness. Lungs: Clear to auscultation bilaterally. No Wheezing or Rhonchi. No rales. Heart: Regular rate and rhythm, no murmur, rub or gallop. Abdomen: Soft, non-tender,not distended. Bowel sounds normal. No masses Extremities: rt pICC Skin: No rashes or lesions. Or bruising Lymph: Cervical, supraclavicular normal. Neurologic: Grossly non-focal  Lab Results Recent Labs    09/22/21 1854 09/23/21 0131  HGB 10.0* 9.8*    Microbiology: Micro 09/17/21 - BC streptococcus GroupG 09/20/21 BC NG CSF Cell count only 3 ( 100% lymphocytes) Protein 54 Studies/Results: DG Chest Port 1 View  Result Date: 09/23/2021 CLINICAL DATA:  PICC placement. EXAM: PORTABLE CHEST 1 VIEW COMPARISON:  Radiograph and CT 09/17/2021 FINDINGS: Tip of the right upper extremity PICC in the mid SVC. The cardiomediastinal contours are stable, upper normal heart size. The lungs are clear. Pulmonary vasculature is normal. No consolidation, pleural effusion, or pneumothorax. No acute osseous abnormalities are seen. IMPRESSION: Tip of the right upper extremity PICC in the mid SVC. Electronically Signed   By: Keith Rake M.D.   On: 09/23/2021 17:27   [place] EKG SITE RITE  Result Date: 09/25/2021 If Site Rite image not attached, placement could not be confirmed due  to current cardiac rhythm.    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

## 2021-09-25 NOTE — Progress Notes (Signed)
Pt pulled out his picc line, and stated he was removing it so he can take shower. Upon assessment of the line, the catheter was intact at 44 cm. Small amount of blood noted at the site. Occlusive dressing applied. On call provider notified who came and assessed the pt.

## 2021-09-25 NOTE — Progress Notes (Signed)
PROGRESS NOTE    Brian Dodson  [id number] DOB: 09-Jan-1956 DOA: 09/17/2021 PCP: [name], MD    Brief Narrative:  65 y.o. male with medical history significant for paroxysmal atrial fibrillation on Eliquis, hypertension, type II DM, CAD, cognitive impairment, schizophrenia, recent hospitalization from 07/22/2021 to 07/26/2021 for severe sepsis from group B strep bacteremia, recent discharge from the hospital on 08/18/2021 after hospitalization for left leg cellulitis.   He was brought to the hospital because of fever and altered mental status.  His temperature was 103.3 F in the emergency room.  Patient was Streptococcus group G bacteremia.  Source is unclear.  This is a recurrent event.  Status post TEE on 11/2 negative for endocarditis.  Source remains unclear.  Consideration for GI source.  GI consulted.  Reached legal guardian who consented to colonoscopy.   C-scope completed 11/4.  1 polyp removed.  Per ID patient will require long-term IV antibiotics.  4 weeks of Rocephin.  Legal guardian reached.  PICC line placed successfully 11/5.  Placement confirmed by imaging.  11/7: Patient self discontinued PICC line on 11/6.  PICC team reengaged for replacement  Assessment & Plan:   Active Problems:   Diabetes mellitus, type II ([place])   HTN (hypertension)   CAD (coronary artery disease)   Sepsis ([place])   Anemia of chronic disease   Acute metabolic encephalopathy   AF (paroxysmal atrial fibrillation) (H[facility])   AKI (acute kidney injury) ([place])   Streptococcal bacteremia  Severe sepsis secondary to Streptococcus bacteremia Source of bacteremia is unclear This is a recurrent event TTE negative CSF negative Stable on penicillin TEE negative Source remains unclear Colonoscopy negative, one 9 mm polyp removed PICC line placed 11/5, patient discontinued on 11/6 Plan: PICC team reengaged for line replacement Continue penicillin G infusion  On discharge patient will need 4 weeks of  antibiotics, ceftriaxone 2 g daily until 11/30 PICC line in place Once PICC line replaced patient medically stable for discharge  Paroxysmal atrial fibrillation Eliquis restarted 11/5  Schizophrenia Cognitive impairment Mental status appears to be at baseline Continue current psychiatric regimen  Acute metabolic encephalopathy Resolved  Acute kidney injury Resolved  Type 2 diabetes mellitus with hyperglycemia Blood glucose stable Continue current regimen  Hypertension BP reasonably well controlled Continue current regimen   DVT prophylaxis: SCD Code Status: Full Family Communication: None today Disposition Plan: Status is: Inpatient  Remains inpatient appropriate because: PICC line discontinued by patient.  Needs to be replaced.  Once that occurs patient will be medically stable for discharge    Level of care: Med-Surg  Consultants:  ID GI  Procedures:  TEE 11/2 Colonoscopy 11/4 PICC line 11/5  Antimicrobials: Penicillin   Subjective: Seen and examined.  Sitting comfortably in chair.  No visible distress.  Objective: Vitals:   09/24/21 1543 09/24/21 1925 09/25/21 0423 09/25/21 0750  BP: 105/76 124/86 112/77 113/76  Pulse: 68 63 79 66  Resp: (!) 21 16 18 16   Temp: 98.6 F (37 C) (!) 97.4 F (36.3 C) 97.7 F (36.5 C) 97.7 F (36.5 C)  TempSrc: Oral Oral Oral Oral  SpO2: 99% 100% 100% 95%  Weight:      Height:        Intake/Output Summary (Last 24 hours) at 09/25/2021 1112 Last data filed at 09/25/2021 1021 Gross per 24 hour  Intake 640 ml  Output 800 ml  Net -160 ml   Filed Weights   09/17/21 0223 09/18/21 1951  Weight: 102.8 kg 105.8  kg    Examination:  General exam: No apparent distress.  Sitting up in chair Respiratory system: Clear lungs.  Normal work of breathing.  Room air Cardiovascular system: S1-S2, RRR, no murmurs Gastrointestinal system: Soft, NT/ND, normal bowel sounds Central nervous system: Alert, oriented x2, no focal  deficits Extremities: Symmetric 5 x 5 power. Skin: No rashes, lesions or ulcers Psychiatry: Judgement and insight appear impaired. Mood & affect confused.     Data Reviewed: I have personally reviewed following labs and imaging studies  CBC: Recent Labs  Lab 09/21/21 0736 09/22/21 1854 09/23/21 0131  WBC 6.0  --   --   NEUTROABS 1.8  --   --   HGB 9.1* 10.0* 9.8*  HCT 28.1*  --   --   MCV 102.9*  --   --   PLT 124*  --   --    Basic Metabolic Panel: Recent Labs  Lab 09/19/21 0539 09/21/21 0736  NA  --  136  K  --  4.6  CL  --  104  CO2  --  26  GLUCOSE  --  99  BUN  --  16  CREATININE 0.97 0.95  CALCIUM  --  8.6*   GFR: Estimated Creatinine Clearance: 99.1 mL/min (by C-G formula based on SCr of 0.95 mg/dL). Liver Function Tests: No results for input(s): AST, ALT, ALKPHOS, BILITOT, PROT, ALBUMIN in the last 168 hours.  No results for input(s): LIPASE, AMYLASE in the last 168 hours.  No results for input(s): AMMONIA in the last 168 hours. Coagulation Profile: No results for input(s): INR, PROTIME in the last 168 hours.  Cardiac Enzymes: No results for input(s): CKTOTAL, CKMB, CKMBINDEX, TROPONINI in the last 168 hours. BNP (last 3 results) No results for input(s): PROBNP in the last 8760 hours. HbA1C: No results for input(s): HGBA1C in the last 72 hours. CBG: Recent Labs  Lab 09/24/21 1635 09/24/21 1922 09/24/21 2355 09/25/21 0424 09/25/21 0748  GLUCAP 100* 154* 91 111* 79   Lipid Profile: No results for input(s): CHOL, HDL, LDLCALC, TRIG, CHOLHDL, LDLDIRECT in the last 72 hours. Thyroid Function Tests: No results for input(s): TSH, T4TOTAL, FREET4, T3FREE, THYROIDAB in the last 72 hours. Anemia Panel: No results for input(s): VITAMINB12, FOLATE, FERRITIN, TIBC, IRON, RETICCTPCT in the last 72 hours. Sepsis Labs: No results for input(s): PROCALCITON, LATICACIDVEN in the last 168 hours.   Recent Results (from the past 240 hour(s))  Blood Culture  (routine x 2)     Status: Abnormal   Collection Time: 09/17/21  2:00 AM   Specimen: BLOOD  Result Value Ref Range Status   Specimen Description   Final    BLOOD RAC Performed at [facility], [address], [place], [place] [address]    Special Requests   Final    BOTTLES DRAWN AEROBIC AND ANAEROBIC Blood Culture adequate volume Performed at [facility], [address] [place], [place] [address]    Culture  Setup Time   Final    GRAM POSITIVE COCCI IN BOTH AEROBIC AND ANAEROBIC BOTTLES CRITICAL RESULT CALLED TO, READ BACK BY AND VERIFIED WITH: WALID NAZARI 09/17/21 1426 AMK    Culture STREPTOCOCCUS GROUP G (A)  Final   Report Status 09/19/2021 FINAL  Final   Organism ID, Bacteria STREPTOCOCCUS GROUP G  Final      Susceptibility   Streptococcus group g - MIC*    CLINDAMYCIN RESISTANT Resistant     AMPICILLIN <=0.25 SENSITIVE Sensitive  ERYTHROMYCIN >=8 RESISTANT Resistant     VANCOMYCIN 0.5 SENSITIVE Sensitive     CEFTRIAXONE <=0.12 SENSITIVE Sensitive     LEVOFLOXACIN 0.5 SENSITIVE Sensitive     PENICILLIN Value in next row Sensitive      SENSITIVE0.06    * STREPTOCOCCUS GROUP G  Blood Culture (routine x 2)     Status: Abnormal   Collection Time: 09/17/21  2:00 AM   Specimen: BLOOD  Result Value Ref Range Status   Specimen Description   Final    BLOOD RH Performed at [facility], [address], [place], [place] [address]    Special Requests   Final    BOTTLES DRAWN AEROBIC AND ANAEROBIC Blood Culture adequate volume Performed at [facility], [address] [place], [place] [address]    Culture  Setup Time   Final    GRAM POSITIVE COCCI IN BOTH AEROBIC AND ANAEROBIC BOTTLES CRITICAL RESULT CALLED TO, READ BACK BY AND VERIFIED WITH: WALID NAZARI 09/17/21 1426 AMK    Culture (A)  Final    STREPTOCOCCUS GROUP G SUSCEPTIBILITIES PERFORMED ON PREVIOUS CULTURE WITHIN THE LAST 5 DAYS. Performed at [facility],  [address], [place], [place] [address]    Report Status 09/19/2021 FINAL  Final  Urine Culture     Status: None   Collection Time: 09/17/21  2:00 AM   Specimen: In/Out Cath Urine  Result Value Ref Range Status   Specimen Description   Final    IN/OUT CATH URINE Performed at [facility], [address] [place], [place] [address]    Special Requests   Final    NONE Performed at [facility], [address], [place], [place] [address]    Culture   Final    NO GROWTH Performed at [facility], [address], [place], [place] [address]    Report Status 09/18/2021 FINAL  Final  Resp Panel by RT-PCR (Flu A&B, Covid) Nasopharyngeal Swab     Status: None   Collection Time: 09/17/21  2:00 AM   Specimen: Nasopharyngeal Swab; Nasopharyngeal(NP) swabs in vial transport medium  Result Value Ref Range Status   SARS Coronavirus 2 by RT PCR NEGATIVE NEGATIVE Final    Comment: (NOTE) SARS-CoV-2 target nucleic acids are NOT DETECTED.  The SARS-CoV-2 RNA is generally detectable in upper respiratory specimens during the acute phase of infection. The lowest concentration of SARS-CoV-2 viral copies this assay can detect is 138 copies/mL. A negative result does not preclude SARS-Cov-2 infection and should not be used as the sole basis for treatment or other patient management decisions. A negative result may occur with  improper specimen collection/handling, submission of specimen other than nasopharyngeal swab, presence of viral mutation(s) within the areas targeted by this assay, and inadequate number of viral copies(<138 copies/mL). A negative result must be combined with clinical observations, patient history, and epidemiological information. The expected result is Negative.  Fact Sheet for Patients:  BloggerCourse.com  Fact Sheet for Healthcare Providers:  SeriousBroker.it  This test is no t yet approved or  cleared by the [place] FDA and  has been authorized for detection and/or diagnosis of SARS-CoV-2 by FDA under an Emergency Use Authorization (EUA). This EUA will remain  in effect (meaning this test can be used) for the duration of the COVID-19 declaration under Section 564(b)(1) of the Act, 21 U.S.C.section 360bbb-3(b)(1), unless the authorization is terminated  or revoked sooner.       Influenza  A by PCR NEGATIVE NEGATIVE Final   Influenza B by PCR NEGATIVE NEGATIVE Final    Comment: (NOTE) The Xpert Xpress SARS-CoV-2/FLU/RSV plus assay is intended as an aid in the diagnosis of influenza from Nasopharyngeal swab specimens and should not be used as a sole basis for treatment. Nasal washings and aspirates are unacceptable for Xpert Xpress SARS-CoV-2/FLU/RSV testing.  Fact Sheet for Patients: EntrepreneurPulse.com.au  Fact Sheet for Healthcare Providers: IncredibleEmployment.be  This test is not yet approved or cleared by the [place] FDA and has been authorized for detection and/or diagnosis of SARS-CoV-2 by FDA under an Emergency Use Authorization (EUA). This EUA will remain in effect (meaning this test can be used) for the duration of the COVID-19 declaration under Section 564(b)(1) of the Act, 21 U.S.C. section 360bbb-3(b)(1), unless the authorization is terminated or revoked.  Performed at [facility], [address] [place], [place] [address]   Blood Culture ID Panel (Reflexed)     Status: Abnormal   Collection Time: 09/17/21  2:00 AM  Result Value Ref Range Status   Enterococcus faecalis NOT DETECTED NOT DETECTED Final   Enterococcus Faecium NOT DETECTED NOT DETECTED Final   Listeria monocytogenes NOT DETECTED NOT DETECTED Final   Staphylococcus species NOT DETECTED NOT DETECTED Final   Staphylococcus aureus (BCID) NOT DETECTED NOT DETECTED Final   Staphylococcus epidermidis NOT DETECTED NOT DETECTED Final    Staphylococcus lugdunensis NOT DETECTED NOT DETECTED Final   Streptococcus species DETECTED (A) NOT DETECTED Final    Comment: Not Enterococcus species, Streptococcus agalactiae, Streptococcus pyogenes, or Streptococcus pneumoniae. CRITICAL RESULT CALLED TO, READ BACK BY AND VERIFIED WITH: WALID NAZARI 09/17/21 1426 AMK    Streptococcus agalactiae NOT DETECTED NOT DETECTED Final   Streptococcus pneumoniae NOT DETECTED NOT DETECTED Final   Streptococcus pyogenes NOT DETECTED NOT DETECTED Final   A.calcoaceticus-baumannii NOT DETECTED NOT DETECTED Final   Bacteroides fragilis NOT DETECTED NOT DETECTED Final   Enterobacterales NOT DETECTED NOT DETECTED Final   Enterobacter cloacae complex NOT DETECTED NOT DETECTED Final   Escherichia coli NOT DETECTED NOT DETECTED Final   Klebsiella aerogenes NOT DETECTED NOT DETECTED Final   Klebsiella oxytoca NOT DETECTED NOT DETECTED Final   Klebsiella pneumoniae NOT DETECTED NOT DETECTED Final   Proteus species NOT DETECTED NOT DETECTED Final   Salmonella species NOT DETECTED NOT DETECTED Final   Serratia marcescens NOT DETECTED NOT DETECTED Final   Haemophilus influenzae NOT DETECTED NOT DETECTED Final   Neisseria meningitidis NOT DETECTED NOT DETECTED Final   Pseudomonas aeruginosa NOT DETECTED NOT DETECTED Final   Stenotrophomonas maltophilia NOT DETECTED NOT DETECTED Final   Candida albicans NOT DETECTED NOT DETECTED Final   Candida auris NOT DETECTED NOT DETECTED Final   Candida glabrata NOT DETECTED NOT DETECTED Final   Candida krusei NOT DETECTED NOT DETECTED Final   Candida parapsilosis NOT DETECTED NOT DETECTED Final   Candida tropicalis NOT DETECTED NOT DETECTED Final   Cryptococcus neoformans/gattii NOT DETECTED NOT DETECTED Final    Comment: Performed at [facility], [address] [place], [place] [address]  CSF culture w Gram Stain     Status: None   Collection Time: 09/18/21  2:14 PM   Specimen: PATH Cytology CSF;  Cerebrospinal Fluid  Result Value Ref Range Status   Specimen Description   Final    CSF Performed at [facility], [address] [place], [place] [address]    Special Requests   Final    NONE Performed at [facility], [place], [place] [address]    Gram Stain   Final    WBC PRESENT, PREDOMINANTLY MONONUCLEAR NO ORGANISMS SEEN CYTOSPIN SMEAR    Culture   Final    NO GROWTH Performed at [facility], [address], [place], [place] [address]    Report Status 09/22/2021 FINAL  Final  CULTURE, BLOOD (ROUTINE X 2) w Reflex to ID Panel     Status: None (Preliminary result)   Collection Time: 09/20/21  6:50 AM   Specimen: BLOOD  Result Value Ref Range Status   Specimen Description BLOOD RIGHT ARM  Final   Special Requests   Final    BOTTLES DRAWN AEROBIC AND ANAEROBIC Blood Culture adequate volume   Culture   Final    NO GROWTH 4 DAYS Performed at [facility], [address], [place], [place] [address]    Report Status PENDING  Incomplete  CULTURE, BLOOD (ROUTINE X 2) w Reflex to ID Panel     Status: None (Preliminary result)   Collection Time: 09/20/21  6:55 AM   Specimen: BLOOD  Result Value Ref Range Status   Specimen Description BLOOD RIGHT HAND  Final   Special Requests   Final    BOTTLES DRAWN AEROBIC AND ANAEROBIC Blood Culture adequate volume   Culture   Final    NO GROWTH 4 DAYS Performed at [facility], [address] [place],  [address]    Report Status PENDING  Incomplete         Radiology Studies: DG Chest Port 1 View  Result Date: 09/23/2021 CLINICAL DATA:  PICC placement. EXAM: PORTABLE CHEST 1 VIEW COMPARISON:  Radiograph and CT 09/17/2021 FINDINGS: Tip of the right upper extremity PICC in the mid SVC. The cardiomediastinal contours are stable, upper normal heart size. The lungs are clear. Pulmonary vasculature is normal. No consolidation, pleural effusion, or pneumothorax. No  acute osseous abnormalities are seen. IMPRESSION: Tip of the right upper extremity PICC in the mid SVC. Electronically Signed   By: Keith Rake M.D.   On: 09/23/2021 17:27   [place] EKG SITE RITE  Result Date: 09/25/2021 If Site Rite image not attached, placement could not be confirmed due to current cardiac rhythm.       Scheduled Meds:  amiodarone  200 mg Oral Daily   apixaban  5 mg Oral BID   aspirin EC  81 mg Oral Daily   atorvastatin  10 mg Oral Daily   benztropine  1 mg Oral QHS   brimonidine  1 drop Both Eyes BID   And   timolol  1 drop Both Eyes BID   Chlorhexidine Gluconate Cloth  6 each Topical Daily   cholecalciferol  2,000 Units Oral Daily   citalopram  20 mg Oral Daily   divalproex  2,000 mg Oral QHS   donepezil  10 mg Oral QHS   ferrous sulfate  325 mg Oral Q breakfast   insulin aspart  0-15 Units Subcutaneous Q4H   latanoprost  1 drop Both Eyes QHS   melatonin  5 mg Oral QHS   multivitamin with minerals  1 tablet Oral Daily   pantoprazole  40 mg Oral BID   Ensure Max Protein  11 oz Oral BID   risperidone  4 mg Oral QHS   sodium chloride flush  10-40 mL Intracatheter Q12H   Continuous Infusions:  penicillin g continuous IV infusion 12 Million Units (09/25/21 1036)  LOS: 8 days    Time spent: 15 minutes    Sidney Ace, MD [facility]   If 7PM-7AM, please contact night-coverage  09/25/2021, 11:12 AM

## 2021-09-26 DIAGNOSIS — N170 Acute kidney failure with tubular necrosis: Secondary | ICD-10-CM | POA: Diagnosis not present

## 2021-09-26 DIAGNOSIS — A409 Streptococcal sepsis, unspecified: Secondary | ICD-10-CM | POA: Diagnosis not present

## 2021-09-26 DIAGNOSIS — R6520 Severe sepsis without septic shock: Secondary | ICD-10-CM | POA: Diagnosis not present

## 2021-09-26 LAB — GLUCOSE, CAPILLARY
Glucose-Capillary: 101 mg/dL — ABNORMAL HIGH (ref 70–99)
Glucose-Capillary: 114 mg/dL — ABNORMAL HIGH (ref 70–99)
Glucose-Capillary: 141 mg/dL — ABNORMAL HIGH (ref 70–99)
Glucose-Capillary: 154 mg/dL — ABNORMAL HIGH (ref 70–99)
Glucose-Capillary: 87 mg/dL (ref 70–99)
Glucose-Capillary: 94 mg/dL (ref 70–99)
Glucose-Capillary: 94 mg/dL (ref 70–99)

## 2021-09-26 LAB — RESP PANEL BY RT-PCR (FLU A&B, COVID) ARPGX2
Influenza A by PCR: NEGATIVE
Influenza B by PCR: NEGATIVE
SARS Coronavirus 2 by RT PCR: NEGATIVE

## 2021-09-26 LAB — SURGICAL PATHOLOGY

## 2021-09-26 MED ORDER — CEFTRIAXONE IV (FOR PTA / DISCHARGE USE ONLY): 2.0000 g | INTRAVENOUS | 0 refills | Status: AC

## 2021-09-26 MED ORDER — SODIUM CHLORIDE 0.9 % IV SOLN
2.0000 g | INTRAVENOUS | Status: DC
  Administered 2021-09-26 – 2021-09-27 (×2): 2 g via INTRAVENOUS
  Filled 2021-09-26 (×4): qty 20

## 2021-09-26 NOTE — Progress Notes (Addendum)
Mobility Specialist - Progress Note   09/26/21 1000  Mobility  Activity Ambulated in hall  Range of Motion/Exercises Active  Level of Assistance Standby assist, set-up cues, supervision of patient - no hands on  Assistive Device Other (Comment) (iv pole)  Distance Ambulated (ft) 640 ft  Mobility Ambulated with assistance in hallway  Mobility Response Tolerated well  Mobility performed by Mobility specialist  $Mobility charge 1 Mobility    During mobility: 96 HR, 100% SpO2   Pt ambulated in hallway pushing IV pole. Able to don socks and shoes in figure 4 position. Returned to room and made up bed with supervision before returning to chair. Alarm set.   Brian Dodson Mobility Specialist 09/26/21, 12:00 PM

## 2021-09-26 NOTE — Progress Notes (Signed)
Mobility Specialist - Progress Note   09/26/21 1400  Mobility  Activity Ambulated to bathroom  Range of Motion/Exercises Active  Level of Assistance Standby assist, set-up cues, supervision of patient - no hands on  Assistive Device None  Distance Ambulated (ft) 25 ft  Mobility Ambulated with assistance in room;Sit up in bed/chair position for meals  Mobility Response Tolerated well  Mobility performed by Mobility specialist  $Mobility charge 1 Mobility    Pt ambulated from recliner to bathroom for seated bathing with supervision. Returned to bed after activity, alarm set.    Filiberto Pinks Mobility Specialist 09/26/21, 3:12 PM

## 2021-09-26 NOTE — Progress Notes (Signed)
PROGRESS NOTE    Brian Dodson  [id number] DOB: 10/24/1956 DOA: 09/17/2021 PCP: [name], MD    Brief Narrative:  65 y.o. male with medical history significant for paroxysmal atrial fibrillation on Eliquis, hypertension, type II DM, CAD, cognitive impairment, schizophrenia, recent hospitalization from 07/22/2021 to 07/26/2021 for severe sepsis from group B strep bacteremia, recent discharge from the hospital on 08/18/2021 after hospitalization for left leg cellulitis.   He was brought to the hospital because of fever and altered mental status.  His temperature was 103.3 F in the emergency room.  Patient was Streptococcus group G bacteremia.  Source is unclear.  This is a recurrent event.  Status post TEE on 11/2 negative for endocarditis.  Source remains unclear.  Consideration for GI source.  GI consulted.  Reached legal guardian who consented to colonoscopy.   C-scope completed 11/4.  1 polyp removed.  Per ID patient will require long-term IV antibiotics.  4 weeks of Rocephin.  Legal guardian reached.  PICC line placed successfully 11/5.  Placement confirmed by imaging.  11/7: Patient self discontinued PICC line on 11/6.  PICC team reengaged for replacement 11/8: PICC line replaced.  Insurance authorization obtained for [facility].  Patient medically stable for discharge at this time pending response from admissions coordinator at [facility]:   Active Problems:   Diabetes mellitus, type II ([place])   HTN (hypertension)   CAD (coronary artery disease)   Sepsis ([place])   Anemia of chronic disease   Acute metabolic encephalopathy   AF (paroxysmal atrial fibrillation) (HCC)   AKI (acute kidney injury) ([place])   Streptococcal bacteremia  Severe sepsis secondary to Streptococcus bacteremia Source of bacteremia is unclear This is a recurrent event TTE negative CSF negative Stable on penicillin TEE negative Source remains unclear Colonoscopy negative, one 9  mm polyp removed PICC line placed 11/5, patient discontinued on 11/6 PICC line replaced on 11/7 Plan: Medically stable for discharge at this time.  Can continue penicillin G infusion while admitted.  On discharge continue ceftriaxone 2 g daily until 11/30.  Weekly labs to be faxed to ID.  Discharge medication reconciliation has been completed.  Paroxysmal atrial fibrillation Eliquis restarted 11/5  Schizophrenia Cognitive impairment Mental status appears to be at baseline Continue current psychiatric regimen  Acute metabolic encephalopathy Resolved  Acute kidney injury Resolved  Type 2 diabetes mellitus with hyperglycemia Blood glucose stable Continue current regimen  Hypertension BP reasonably well controlled Continue current regimen   DVT prophylaxis: SCD Code Status: Full Family Communication: None today Disposition Plan: Status is: Inpatient  Remains inpatient appropriate because: Unsafe discharge plan.  PICC line has been replaced and discharge medication reconciliation has been completed.  Patient is stable for discharge and can leave to skilled nursing facility once admissions coordinator improved.          Level of care: Med-Surg  Consultants:  ID GI  Procedures:  TEE 11/2 Colonoscopy 11/4 PICC line 11/5, discontinued by patient PICC line replaced 11/7  Antimicrobials: Penicillin   Subjective: Seen and examined.  Sitting comfortably in chair.  No visible distress.  Objective: Vitals:   09/25/21 1545 09/25/21 1938 09/26/21 0421 09/26/21 0740  BP: 103/60 (!) 141/93 129/82 120/74  Pulse: (!) 55 (!) 56 (!) 52 (!) 43  Resp: 16 18 20    Temp: 98 F (36.7 C) 98 F (36.7 C) (!) 97.4 F (36.3 C) 97.6 F (36.4 C)  TempSrc: Oral  Oral   SpO2: 100%  100% 100% 100%  Weight:      Height:        Intake/Output Summary (Last 24 hours) at 09/26/2021 1409 Last data filed at 09/26/2021 1206 Gross per 24 hour  Intake 580 ml  Output 3750 ml  Net  -3170 ml   Filed Weights   09/17/21 0223 09/18/21 1951  Weight: 102.8 kg 105.8 kg    Examination:  General exam: No apparent distress.  Sitting up in chair Respiratory system: Clear lungs.  Normal work of breathing.  Room air Cardiovascular system: S1-S2, RRR, no murmurs Gastrointestinal system: Soft, NT/ND, normal bowel sounds Central nervous system: Alert, oriented x2, no focal deficits Extremities: Symmetric 5 x 5 power. Skin: No rashes, lesions or ulcers Psychiatry: Judgement and insight appear impaired. Mood & affect confused.     Data Reviewed: I have personally reviewed following labs and imaging studies  CBC: Recent Labs  Lab 09/21/21 0736 09/22/21 1854 09/23/21 0131  WBC 6.0  --   --   NEUTROABS 1.8  --   --   HGB 9.1* 10.0* 9.8*  HCT 28.1*  --   --   MCV 102.9*  --   --   PLT 124*  --   --    Basic Metabolic Panel: Recent Labs  Lab 09/21/21 0736  NA 136  K 4.6  CL 104  CO2 26  GLUCOSE 99  BUN 16  CREATININE 0.95  CALCIUM 8.6*   GFR: Estimated Creatinine Clearance: 99.1 mL/min (by C-G formula based on SCr of 0.95 mg/dL). Liver Function Tests: No results for input(s): AST, ALT, ALKPHOS, BILITOT, PROT, ALBUMIN in the last 168 hours.  No results for input(s): LIPASE, AMYLASE in the last 168 hours.  No results for input(s): AMMONIA in the last 168 hours. Coagulation Profile: No results for input(s): INR, PROTIME in the last 168 hours.  Cardiac Enzymes: No results for input(s): CKTOTAL, CKMB, CKMBINDEX, TROPONINI in the last 168 hours. BNP (last 3 results) No results for input(s): PROBNP in the last 8760 hours. HbA1C: No results for input(s): HGBA1C in the last 72 hours. CBG: Recent Labs  Lab 09/26/21 0001 09/26/21 0335 09/26/21 0658 09/26/21 0740 09/26/21 1129  GLUCAP 141* 94 101* 94 154*   Lipid Profile: No results for input(s): CHOL, HDL, LDLCALC, TRIG, CHOLHDL, LDLDIRECT in the last 72 hours. Thyroid Function Tests: No results for  input(s): TSH, T4TOTAL, FREET4, T3FREE, THYROIDAB in the last 72 hours. Anemia Panel: No results for input(s): VITAMINB12, FOLATE, FERRITIN, TIBC, IRON, RETICCTPCT in the last 72 hours. Sepsis Labs: No results for input(s): PROCALCITON, LATICACIDVEN in the last 168 hours.   Recent Results (from the past 240 hour(s))  Blood Culture (routine x 2)     Status: Abnormal   Collection Time: 09/17/21  2:00 AM   Specimen: BLOOD  Result Value Ref Range Status   Specimen Description   Final    BLOOD RAC Performed at [facility], [address] [place], [place] [address]    Special Requests   Final    BOTTLES DRAWN AEROBIC AND ANAEROBIC Blood Culture adequate volume Performed at [facility], [address] [place], [place] [address]    Culture  Setup Time   Final    GRAM POSITIVE COCCI IN BOTH AEROBIC AND ANAEROBIC BOTTLES CRITICAL RESULT CALLED TO, READ BACK BY AND VERIFIED WITH: WALID NAZARI 09/17/21 1426 AMK    Culture STREPTOCOCCUS GROUP G (A)  Final   Report Status 09/19/2021 FINAL  Final  Organism ID, Bacteria STREPTOCOCCUS GROUP G  Final      Susceptibility   Streptococcus group g - MIC*    CLINDAMYCIN RESISTANT Resistant     AMPICILLIN <=0.25 SENSITIVE Sensitive     ERYTHROMYCIN >=8 RESISTANT Resistant     VANCOMYCIN 0.5 SENSITIVE Sensitive     CEFTRIAXONE <=0.12 SENSITIVE Sensitive     LEVOFLOXACIN 0.5 SENSITIVE Sensitive     PENICILLIN Value in next row Sensitive      SENSITIVE0.06    * STREPTOCOCCUS GROUP G  Blood Culture (routine x 2)     Status: Abnormal   Collection Time: 09/17/21  2:00 AM   Specimen: BLOOD  Result Value Ref Range Status   Specimen Description   Final    BLOOD RH Performed at [facility], [address] [place], [place] [address]    Special Requests   Final    BOTTLES DRAWN AEROBIC AND ANAEROBIC Blood Culture adequate volume Performed at [facility], [address] [place], [place] [address]     Culture  Setup Time   Final    GRAM POSITIVE COCCI IN BOTH AEROBIC AND ANAEROBIC BOTTLES CRITICAL RESULT CALLED TO, READ BACK BY AND VERIFIED WITH: WALID NAZARI 09/17/21 1426 AMK    Culture (A)  Final    STREPTOCOCCUS GROUP G SUSCEPTIBILITIES PERFORMED ON PREVIOUS CULTURE WITHIN THE LAST 5 DAYS. Performed at [facility], [place] [address], [place], [place] [address]    Report Status 09/19/2021 FINAL  Final  Urine Culture     Status: None   Collection Time: 09/17/21  2:00 AM   Specimen: In/Out Cath Urine  Result Value Ref Range Status   Specimen Description   Final    IN/OUT CATH URINE Performed at [facility], [address], [place], [place] [address]    Special Requests   Final    NONE Performed at [facility], [address] [place], [place] [address]    Culture   Final    NO GROWTH Performed at [facility], [place] [address], [place], [place] [address]    Report Status 09/18/2021 FINAL  Final  Resp Panel by RT-PCR (Flu A&B, Covid) Nasopharyngeal Swab     Status: None   Collection Time: 09/17/21  2:00 AM   Specimen: Nasopharyngeal Swab; Nasopharyngeal(NP) swabs in vial transport medium  Result Value Ref Range Status   SARS Coronavirus 2 by RT PCR NEGATIVE NEGATIVE Final    Comment: (NOTE) SARS-CoV-2 target nucleic acids are NOT DETECTED.  The SARS-CoV-2 RNA is generally detectable in upper respiratory specimens during the acute phase of infection. The lowest concentration of SARS-CoV-2 viral copies this assay can detect is 138 copies/mL. A negative result does not preclude SARS-Cov-2 infection and should not be used as the sole basis for treatment or other patient management decisions. A negative result may occur with  improper specimen collection/handling, submission of specimen other than nasopharyngeal swab, presence of viral mutation(s) within the areas targeted by this assay, and inadequate number of viral copies(<138 copies/mL). A  negative result must be combined with clinical observations, patient history, and epidemiological information. The expected result is Negative.  Fact Sheet for Patients:  EntrepreneurPulse.com.au  Fact Sheet for Healthcare Providers:  IncredibleEmployment.be  This test is no t yet approved or cleared by the [place] FDA and  has been authorized for detection and/or diagnosis of SARS-CoV-2 by FDA under an Emergency Use Authorization (EUA). This EUA will remain  in  effect (meaning this test can be used) for the duration of the COVID-19 declaration under Section 564(b)(1) of the Act, 21 U.S.C.section 360bbb-3(b)(1), unless the authorization is terminated  or revoked sooner.       Influenza A by PCR NEGATIVE NEGATIVE Final   Influenza B by PCR NEGATIVE NEGATIVE Final    Comment: (NOTE) The Xpert Xpress SARS-CoV-2/FLU/RSV plus assay is intended as an aid in the diagnosis of influenza from Nasopharyngeal swab specimens and should not be used as a sole basis for treatment. Nasal washings and aspirates are unacceptable for Xpert Xpress SARS-CoV-2/FLU/RSV testing.  Fact Sheet for Patients: BloggerCourse.com  Fact Sheet for Healthcare Providers: SeriousBroker.it  This test is not yet approved or cleared by the [place] FDA and has been authorized for detection and/or diagnosis of SARS-CoV-2 by FDA under an Emergency Use Authorization (EUA). This EUA will remain in effect (meaning this test can be used) for the duration of the COVID-19 declaration under Section 564(b)(1) of the Act, 21 U.S.C. section 360bbb-3(b)(1), unless the authorization is terminated or revoked.  Performed at [facility], [address] [place], [place] [address]   Blood Culture ID Panel (Reflexed)     Status: Abnormal   Collection Time: 09/17/21  2:00 AM  Result Value Ref Range Status   Enterococcus  faecalis NOT DETECTED NOT DETECTED Final   Enterococcus Faecium NOT DETECTED NOT DETECTED Final   Listeria monocytogenes NOT DETECTED NOT DETECTED Final   Staphylococcus species NOT DETECTED NOT DETECTED Final   Staphylococcus aureus (BCID) NOT DETECTED NOT DETECTED Final   Staphylococcus epidermidis NOT DETECTED NOT DETECTED Final   Staphylococcus lugdunensis NOT DETECTED NOT DETECTED Final   Streptococcus species DETECTED (A) NOT DETECTED Final    Comment: Not Enterococcus species, Streptococcus agalactiae, Streptococcus pyogenes, or Streptococcus pneumoniae. CRITICAL RESULT CALLED TO, READ BACK BY AND VERIFIED WITH: WALID NAZARI 09/17/21 1426 AMK    Streptococcus agalactiae NOT DETECTED NOT DETECTED Final   Streptococcus pneumoniae NOT DETECTED NOT DETECTED Final   Streptococcus pyogenes NOT DETECTED NOT DETECTED Final   A.calcoaceticus-baumannii NOT DETECTED NOT DETECTED Final   Bacteroides fragilis NOT DETECTED NOT DETECTED Final   Enterobacterales NOT DETECTED NOT DETECTED Final   Enterobacter cloacae complex NOT DETECTED NOT DETECTED Final   Escherichia coli NOT DETECTED NOT DETECTED Final   Klebsiella aerogenes NOT DETECTED NOT DETECTED Final   Klebsiella oxytoca NOT DETECTED NOT DETECTED Final   Klebsiella pneumoniae NOT DETECTED NOT DETECTED Final   Proteus species NOT DETECTED NOT DETECTED Final   Salmonella species NOT DETECTED NOT DETECTED Final   Serratia marcescens NOT DETECTED NOT DETECTED Final   Haemophilus influenzae NOT DETECTED NOT DETECTED Final   Neisseria meningitidis NOT DETECTED NOT DETECTED Final   Pseudomonas aeruginosa NOT DETECTED NOT DETECTED Final   Stenotrophomonas maltophilia NOT DETECTED NOT DETECTED Final   Candida albicans NOT DETECTED NOT DETECTED Final   Candida auris NOT DETECTED NOT DETECTED Final   Candida glabrata NOT DETECTED NOT DETECTED Final   Candida krusei NOT DETECTED NOT DETECTED Final   Candida parapsilosis NOT DETECTED NOT  DETECTED Final   Candida tropicalis NOT DETECTED NOT DETECTED Final   Cryptococcus neoformans/gattii NOT DETECTED NOT DETECTED Final    Comment: Performed at [facility], [address] [place], [place] [address]  CSF culture w Gram Stain     Status: None   Collection Time: 09/18/21  2:14 PM   Specimen: PATH Cytology CSF; Cerebrospinal Fluid  Result Value Ref Range  Status   Specimen Description   Final    CSF Performed at [facility], [address] [place], [place] [address]    Special Requests   Final    NONE Performed at [facility], [address] [place], [place] [address]    Gram Stain   Final    WBC PRESENT, PREDOMINANTLY MONONUCLEAR NO ORGANISMS SEEN CYTOSPIN SMEAR    Culture   Final    NO GROWTH Performed at [facility], [address], [place], [place] [address]    Report Status 09/22/2021 FINAL  Final  CULTURE, BLOOD (ROUTINE X 2) w Reflex to ID Panel     Status: None   Collection Time: 09/20/21  6:50 AM   Specimen: BLOOD  Result Value Ref Range Status   Specimen Description BLOOD RIGHT ARM  Final   Special Requests   Final    BOTTLES DRAWN AEROBIC AND ANAEROBIC Blood Culture adequate volume   Culture   Final    NO GROWTH 5 DAYS Performed at [facility], [address] [place], [place] [address]    Report Status 09/25/2021 FINAL  Final  CULTURE, BLOOD (ROUTINE X 2) w Reflex to ID Panel     Status: None   Collection Time: 09/20/21  6:55 AM   Specimen: BLOOD  Result Value Ref Range Status   Specimen Description BLOOD RIGHT HAND  Final   Special Requests   Final    BOTTLES DRAWN AEROBIC AND ANAEROBIC Blood Culture adequate volume   Culture   Final    NO GROWTH 5 DAYS Performed at [facility], [address], [place], [place] [address]    Report Status 09/25/2021 FINAL  Final         Radiology Studies: [place] EKG SITE RITE  Result Date: 09/25/2021 If Site Rite image not attached, placement  could not be confirmed due to current cardiac rhythm.       Scheduled Meds:  amiodarone  200 mg Oral Daily   apixaban  5 mg Oral BID   aspirin EC  81 mg Oral Daily   atorvastatin  10 mg Oral Daily   benztropine  1 mg Oral QHS   brimonidine  1 drop Both Eyes BID   And   timolol  1 drop Both Eyes BID   Chlorhexidine Gluconate Cloth  6 each Topical Daily   cholecalciferol  2,000 Units Oral Daily   citalopram  20 mg Oral Daily   divalproex  2,000 mg Oral QHS   donepezil  10 mg Oral QHS   ferrous sulfate  325 mg Oral Q breakfast   insulin aspart  0-15 Units Subcutaneous Q4H   latanoprost  1 drop Both Eyes QHS   melatonin  5 mg Oral QHS   multivitamin with minerals  1 tablet Oral Daily   pantoprazole  40 mg Oral BID   Ensure Max Protein  11 oz Oral BID   risperidone  4 mg Oral QHS   sodium chloride flush  10-40 mL Intracatheter Q12H   Continuous Infusions:  cefTRIAXone (ROCEPHIN)  IV 2 g (09/26/21 1158)     LOS: 9 days    Time spent: 15 minutes    Tresa Moore, MD [facility]   If 7PM-7AM, please contact night-coverage  09/26/2021, 2:09 PM

## 2021-09-26 NOTE — TOC Progression Note (Addendum)
Transition of Care [facility]) - Progression Note    Patient Details  Name: Brian Dodson MRN: [id number] Date of Birth: Jan 12, 1956  Transition of [facility] CM/SW Contact  Brian Liner, Brian Dodson Phone Number: 09/26/2021, 9:27 AM  Clinical Narrative:  PASARR still under level 2 review.   10:21 am: Brian Dodson will be here within the next hour to complete PASARR assessment.  11:31 am: Patient walking long distances so not appropriate for EMS transport at discharge. Asked DSS supervisor Brian Dodson if they would be able to transport. She said it would depend on time of discharge but she will put her staff on standby.  2:03 pm: PASARR obtained: [phone] [facility] able to accept him today. Rapid COVID pending. Emailed and Data processing manager for [facility] to see if they can transport today.   3:41 pm: COVID negative. No call/email back from [facility] yet. If DSS unable to transport, [facility] would still have to sign rider waiver for [employer].  Expected Discharge Plan: Assisted Living Barriers to Discharge: Continued Medical Work up  Expected Discharge Plan and Services Expected Discharge Plan: Assisted Living   Discharge Planning Services: CM Consult Post Acute Care Choice: Resumption of Svcs/PTA Provider Living arrangements for the past 2 months: Assisted Living Facility                 DME Arranged: N/A DME Agency: NA       HH Arranged: NA           Social Determinants of Health (SDOH) Interventions    Readmission Risk Interventions Readmission Risk Prevention Plan 09/19/2021  Transportation Screening Complete  PCP or Specialist Appt within 3-5 Days Complete  HRI or Home Care Consult Complete  Social Work Consult for Recovery Care Planning/Counseling Complete  Palliative Care Screening Not Applicable  Medication Review Oceanographer) Complete  Some recent data might be hidden

## 2021-09-26 NOTE — Progress Notes (Signed)
PHARMACY CONSULT NOTE FOR:  OUTPATIENT  PARENTERAL ANTIBIOTIC THERAPY (OPAT)  Indication: Strep G bacteremia Regimen: ceftriaxone 2 grams every 24 hours End date: 10/18/2021  IV antibiotic discharge orders are pended. To discharging provider:  please sign these orders via discharge navigator,  Select New Orders & click on the button choice - Manage This Unsigned Work.     Thank you for allowing pharmacy to be a part of this patient's care.  Jaynie Bream, PharmD Pharmacy Resident  09/26/2021 8:19 AM

## 2021-09-26 NOTE — Progress Notes (Signed)
Nutrition Follow-up  DOCUMENTATION CODES:  Not applicable  INTERVENTION:  Continue Ensure Max BID.  Continue MVI with minerals daily.  NUTRITION DIAGNOSIS:  Increased nutrient needs related to acute illness as evidenced by estimated needs. - ongoing  GOAL:  Patient will meet greater than or equal to 90% of their needs. - meeting consistently  MONITOR:  PO intake, Supplement acceptance, Labs, Weight trends, Skin, I & O's  REASON FOR ASSESSMENT:  Malnutrition Screening Tool    ASSESSMENT:  65 y.o. male with medical history significant for paroxysmal atrial fibrillation on Eliquis, hypertension, type II DM, CAD, cognitive impairment, schizophrenia, GERD, BPH and recent hospitalization from 07/22/2021 to 07/26/2021 for severe sepsis from group B strep bacteremia for left leg cellulitis who is admitted with fever and AMS 10/31 - LP 11/2 - TEE without cardioversion 11/4 - colonoscopy  Spoke to patient at bedside. Pt very pleasantly confused and kind. He reports that RD reminds him of a friend of his from where he used to work.  Per Epic, pt has eaten 100% of the past 8 meals. Documentation is slightly limited, however.  Pt due for new weight. RD to order.  Of note, pt with mild BLE.  Supplements: Ensure Max BID  Medications: reviewed; Vitamin D, Depkaote ER, ferrous sulfate, SSI, MVI with minerals, Protonix BID  Labs: reviewed; CBG 94-216  NUTRITION - FOCUSED PHYSICAL EXAM: Flowsheet Row Most Recent Value  Orbital Region Mild depletion  Upper Arm Region Mild depletion  Thoracic and Lumbar Region No depletion  Buccal Region No depletion  Temple Region No depletion  Clavicle Bone Region No depletion  Clavicle and Acromion Bone Region No depletion  Scapular Bone Region No depletion  Dorsal Hand Mild depletion  Patellar Region No depletion  Anterior Thigh Region No depletion  Posterior Calf Region Mild depletion  Edema (RD Assessment) Mild  [BLE]  Hair Reviewed  Eyes  Reviewed  Mouth Reviewed  Skin Reviewed  Nails Reviewed   Diet Order:   Diet Order             Diet Heart Room service appropriate? Yes; Fluid consistency: Thin  Diet effective now                  EDUCATION NEEDS:  Not appropriate for education at this time  Skin:  Skin Assessment: Reviewed RN Assessment  Last BM:  09/24/21 - Type 4, small  Height:  Ht Readings from Last 1 Encounters:  09/18/21 6\' 2"  (1.88 m)   Weight:  Wt Readings from Last 1 Encounters:  09/18/21 105.8 kg   BMI:  Body mass index is 29.95 kg/m.  Estimated Nutritional Needs:  Kcal:  2200-2500kcal/day Protein:  110-125g/day Fluid:  2.2-2.5L/day  09/20/21, RD, LDN (she/her/hers) Clinical Inpatient Dietitian RD Pager/After-Hours/Weekend Pager # in [place]

## 2021-09-26 NOTE — Care Management Important Message (Signed)
Important Message  Patient Details  Name: MERIT MAYBEE MRN: [id number] Date of Birth: 1956/08/26   Medicare Important Message Given:  Other (see comment)  Left voicemail with [name], [place] DSS at [phone] to review Medicare IM.  Encouraged callback if any questions regarding appeal right. Copy of Medicare IM faxed to Kailee's attention at [phone].     Johnell Comings 09/26/2021, 2:44 PM

## 2021-09-26 NOTE — Discharge Summary (Addendum)
Physician Discharge Summary  Brian Dodson [id number] DOB: 01/21/1956 DOA: 09/17/2021  PCP: [name], MD  Admit date: 09/17/2021 Discharge date: 09/27/2021  Admitted From: Home Disposition: SNF  Recommendations for Outpatient Follow-up:  Follow up with PCP in 1-2 weeks Follow-up with ID after completion of course of antibiotics approximately 1 month  Home Health: No Equipment/Devices: Yes RUE PICC  Discharge Condition: Stable CODE STATUS: Full Diet recommendation: Regular  Brief/Interim Summary: 65 y.o. male with medical history significant for paroxysmal atrial fibrillation on Eliquis, hypertension, type II DM, CAD, cognitive impairment, schizophrenia, recent hospitalization from 07/22/2021 to 07/26/2021 for severe sepsis from group B strep bacteremia, recent discharge from the hospital on 08/18/2021 after hospitalization for left leg cellulitis.   He was brought to the hospital because of fever and altered mental status.  His temperature was 103.3 F in the emergency room.  Patient was Streptococcus group G bacteremia.  Source is unclear.  This is a recurrent event.  Status post TEE on 11/2 negative for endocarditis.  Source remains unclear.  Consideration for GI source.  GI consulted.  Reached legal guardian who consented to colonoscopy.   C-scope completed 11/4.  1 polyp removed.  Per ID patient will require long-term IV antibiotics.  4 weeks of Rocephin.  Legal guardian reached.  PICC line placed successfully 11/5.  Placement confirmed by imaging.   11/7: Patient self discontinued PICC line on 11/6.  PICC team reengaged for replacement 11/8: PICC line replaced.  Ins[facility] obtained for [facility].  Patient medically stable for discharge at this time pending response from admissions coordinator at skilled nursing.  Patient can discharge once transport has been set    Discharge Diagnoses:  Active Problems:   Diabetes mellitus, type II ([place])   HTN  (hypertension)   CAD (coronary artery disease)   Sepsis ([place])   Anemia of chronic disease   Acute metabolic encephalopathy   AF (paroxysmal atrial fibrillation) ([facility])   AKI (acute kidney injury) ([place])   Streptococcal bacteremia Severe sepsis secondary to Streptococcus bacteremia Source of bacteremia is unclear This is a recurrent event TTE negative CSF negative Stable on penicillin TEE negative Source remains unclear Colonoscopy negative, one 9 mm polyp removed PICC line placed 11/5, patient discontinued on 11/6 PICC line replaced on 11/7 Plan: Medically stable for discharge at this time.  Can continue penicillin G infusion while admitted.  On discharge continue ceftriaxone 2 g daily until 11/30.  Weekly labs to be faxed to ID.  Discharge medication reconciliation has been completed.   Paroxysmal atrial fibrillation Eliquis restarted 11/5. Coreg held during hospitalization for bradycardia and normotension, will need attention to hr as outpatient   Schizophrenia Cognitive impairment Mental status appears to be at baseline Continue current psychiatric regimen   Acute metabolic encephalopathy Resolved   Acute kidney injury Resolved   Type 2 diabetes mellitus with hyperglycemia Blood glucose stable Continue current regimen   Hypertension BP reasonably well controlled Continue current regimen   Discharge Instructions  Discharge Instructions     Home infusion instructions   Complete by: As directed    Instructions: Flushing of vascular access device: 0.9% NaCl pre/post medication administration and prn patency; Heparin 100 u/ml, 18ml for implanted ports and Heparin 10u/ml, 14ml for all other central venous catheters.      Allergies as of 09/27/2021       Reactions   Sulfa Antibiotics Other (See Comments)   Unknown reaction   Xarelto [rivaroxaban]  Medication List     STOP taking these medications    carvedilol 6.25 MG tablet Commonly known as:  COREG   losartan 25 MG tablet Commonly known as: Cozaar       TAKE these medications    acetaminophen 500 MG tablet Commonly known as: TYLENOL Take 1,000 mg by mouth every 6 (six) hours as needed for mild pain.   amiodarone 200 MG tablet Commonly known as: PACERONE Take 1 tablet (200 mg total) by mouth daily.   aspirin 81 MG tablet Take 81 mg by mouth daily.   atorvastatin 10 MG tablet Commonly known as: LIPITOR Take 10 mg by mouth daily.   benztropine 1 MG tablet Commonly known as: COGENTIN Take 1 mg by mouth at bedtime.   Biofreeze 4 % Gel Generic drug: Menthol (Topical Analgesic) Apply 1 application topically 3 (three) times daily. To lower back area as needed for pain   brimonidine-timolol 0.2-0.5 % ophthalmic solution Commonly known as: COMBIGAN Place 1 drop into both eyes every 12 (twelve) hours. Wait 3 to 5 minutes between drops   cefTRIAXone  IVPB Commonly known as: ROCEPHIN Inject 2 g into the vein daily for 22 days. Indication:  Streptococcus group G bacteremia Last Day of Therapy:  10/18/2021 Labs - On Mondays CBC/D and CMP, Please pull PIC at completion of IV antibiotics Fax weekly labs to  Dr.Ravishankar [phone] [id number]   citalopram 20 MG tablet Commonly known as: CELEXA Take 20 mg by mouth daily.   diclofenac Sodium 1 % Gel Commonly known as: VOLTAREN Apply 2 g topically 4 (four) times daily as needed. Apply to neck and shoulders   divalproex 500 MG 24 hr tablet Commonly known as: DEPAKOTE ER Take 2,000 mg by mouth at bedtime.   donepezil 10 MG tablet Commonly known as: ARICEPT Take 10 mg by mouth at bedtime.   Eliquis 5 MG Tabs tablet Generic drug: apixaban Take 5 mg by mouth 2 (two) times daily.   EQL Vitamin D3 50 MCG (2000 UT) Caps Generic drug: Cholecalciferol Take 1 capsule by mouth daily.   ferrous sulfate 325 (65 FE) MG tablet Take 325 mg by mouth daily with breakfast.   furosemide 20 MG tablet Commonly known as:  LASIX Take 20 mg by mouth daily as needed. For edema (swelling)   lanolin/mineral oil Lotn Apply 1 application topically as needed for dry skin. Apply to face after washing face daily   Lumigan 0.01 % Soln Generic drug: bimatoprost Place 1 drop into both eyes at bedtime.   melatonin 5 MG Tabs Take 5 mg by mouth at bedtime.   metFORMIN 500 MG tablet Commonly known as: GLUCOPHAGE Take 500 mg by mouth 2 (two) times daily with a meal.   omeprazole 20 MG capsule Commonly known as: PRILOSEC Take 20 mg by mouth 2 (two) times daily before a meal. May open and sprinkle in applesauce.   oyster calcium 500 MG Tabs tablet Take 500 mg of elemental calcium by mouth 2 (two) times daily.   risperidone 4 MG tablet Commonly known as: RISPERDAL Take 4 mg by mouth at bedtime.   vitamin C 250 MG tablet Commonly known as: ASCORBIC ACID Take 1 tablet (250 mg total) by mouth daily.               Home Infusion Instuctions  (From admission, onward)           Start     Ordered   09/26/21 0000  Home infusion instructions  Question:  Instructions  Answer:  Flushing of vascular access device: 0.9% NaCl pre/post medication administration and prn patency; Heparin 100 u/ml, 105ml for implanted ports and Heparin 10u/ml, 46ml for all other central venous catheters.   09/26/21 1409            Contact information for after-discharge care     Destination     [facility] Preferred SNF .   Service: Skilled Nursing Contact information: [address] [place] [phone]                    Allergies  Allergen Reactions   Sulfa Antibiotics Other (See Comments)    Unknown reaction   Xarelto [Rivaroxaban]     Consultations: ID GI Cardiology   Procedures/Studies: CT Head Wo Contrast  Result Date: 09/17/2021 CLINICAL DATA:  Mental status change with unknown cause EXAM: CT HEAD WITHOUT CONTRAST TECHNIQUE: Contiguous axial  images were obtained from the base of the skull through the vertex without intravenous contrast. COMPARISON:  03/03/2021 FINDINGS: Brain: No evidence of acute infarction, hemorrhage, hydrocephalus, extra-axial collection or mass lesion/mass effect. Chronic small vessel ischemia in the hemispheric white matter Vascular: No hyperdense vessel or unexpected calcification. Skull: Normal. Negative for fracture or focal lesion. Sinuses/Orbits: No acute finding. IMPRESSION: 1. No acute or interval finding. 2. Mild chronic small vessel ischemia. Electronically Signed   By: Jorje Guild M.D.   On: 09/17/2021 04:07   CT CHEST ABDOMEN PELVIS W CONTRAST  Result Date: 09/17/2021 CLINICAL DATA:  Fever, altered mental status, abdominal distension EXAM: CT CHEST, ABDOMEN, AND PELVIS WITH CONTRAST TECHNIQUE: Multidetector CT imaging of the chest, abdomen and pelvis was performed following the standard protocol during bolus administration of intravenous contrast. CONTRAST:  33mL OMNIPAQUE IOHEXOL 300 MG/ML  SOLN COMPARISON:  CT abdomen/pelvis dated 07/22/2021 FINDINGS: Note: The patient was injected via a left hand IV. 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 As result, the normal contrast timing is significantly delayed, and the study is essentially unenhanced. The delayed study does demonstrate excretory contrast within the bilateral renal collecting systems. CT CHEST FINDINGS Cardiovascular: Mild cardiomegaly.  No pericardial effusion. No evidence of thoracic aortic aneurysm. Mild atherosclerotic calcifications of the aortic arch. Three vessel coronary atherosclerosis. Mediastinum/Nodes: No suspicious mediastinal lymphadenopathy. Visualized thyroid is unremarkable. Lungs/Pleura: Mild dependent atelectasis in the bilateral lower lobes. Evaluation of the lung parenchyma is constrained by respiratory motion. Within that  constraint, there are no suspicious pulmonary nodules. No focal consolidation. No pleural effusion or pneumothorax. Musculoskeletal: Degenerative changes of the lower thoracic spine. CT ABDOMEN PELVIS FINDINGS Hepatobiliary: Liver is within normal limits. Gallbladder is unremarkable. No intrahepatic or extrahepatic ductal dilatation. Pancreas: Within normal limits. Spleen: Within normal limits. Adrenals/Urinary Tract: Adrenal glands are within normal limits. Kidneys are within normal limits.  No hydronephrosis. Thick-walled bladder, although underdistended. Stomach/Bowel: Stomach is within normal limits. No evidence of bowel obstruction. Normal appendix (series 3/image 93). Vascular/Lymphatic: No evidence of abdominal aortic aneurysm. Atherosclerotic calcifications of the abdominal aorta and branch vessels. No suspicious abdominopelvic lymphadenopathy. Reproductive: Prostate is unremarkable. Other: No abdominopelvic ascites. Musculoskeletal: Degenerative changes of the lumbar spine. IMPRESSION: No acute findings in the chest, abdomen, or pelvis. 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 Future enhanced CTs should be done via right arm  IV placement. Electronically Signed   By: Julian Hy M.D.   On: 09/17/2021 03:57   DG Chest Port 1 View  Result Date: 09/23/2021 CLINICAL DATA:  PICC placement. EXAM: PORTABLE CHEST 1 VIEW COMPARISON:  Radiograph and CT 09/17/2021 FINDINGS: Tip of the right upper extremity PICC in the mid SVC. The cardiomediastinal contours are stable, upper normal heart size. The lungs are clear. Pulmonary vasculature is normal. No consolidation, pleural effusion, or pneumothorax. No acute osseous abnormalities are seen. IMPRESSION: Tip of the right upper extremity PICC in the mid SVC. Electronically Signed   By: Keith Rake M.D.   On: 09/23/2021 17:27   DG Chest Port 1  View  Result Date: 09/17/2021 CLINICAL DATA:  Back pain EXAM: PORTABLE CHEST 1 VIEW COMPARISON:  08/15/2021 FINDINGS: Lungs are clear.  No pleural effusion or pneumothorax. The heart is normal in size. IMPRESSION: No evidence of acute cardiopulmonary disease. Electronically Signed   By: Julian Hy M.D.   On: 09/17/2021 02:17   ECHO TEE  Result Date: 09/20/2021    TRANSESOPHOGEAL ECHO REPORT   Patient Name:   NAVDEEP WIEDERHOLT Date of Exam: 09/20/2021 Medical Rec #:  [id number]     Height:       74.0 in Accession #:    [id number]    Weight:       233.2 lb Date of Birth:  Nov 30, 1955    BSA:          2.320 m Patient Age:    89 years      BP:           112/78 mmHg Patient Gender: M             HR:           49 bpm. Exam Location:  [facility] Procedure: Transesophageal Echo, Limited Color Doppler and Saline Contrast            Bubble Study Indications:     R78.81 Bacteremia  History:         Patient has prior history of Echocardiogram examinations, most                  recent 09/18/2021. CAD, COPD; Arrythmias:Atrial Fibrillation.  Sonographer:     Charmayne Sheer Referring Phys:  [facility] Diagnosing Phys: Bartholome Bill MD PROCEDURE: TEE procedure time was 15 minutes. The transesophogeal probe was passed without difficulty through the esophogus of the patient. Imaged were obtained with the patient in a supine position. Sedation performed by different physician. The patient  was monitored while under deep sedation. Image quality was excellent. The patient's vital signs; including heart rate, blood pressure, and oxygen saturation; remained stable throughout the procedure. The patient developed no complications during the procedure. IMPRESSIONS  1. Left ventricular ejection fraction, by estimation, is 60 to 65%. The left ventricle has normal function. The left ventricle has no regional wall motion abnormalities. Left ventricular diastolic function could not be evaluated.  2. Right ventricular systolic function is  normal. The right ventricular size is normal.  3. No left atrial/left atrial appendage thrombus was detected.  4. The mitral valve is normal in structure. Trivial mitral valve regurgitation.  5. The aortic valve is tricuspid. Aortic valve regurgitation is trivial. Conclusion(s)/Recommendation(s): Normal biventricular function without evidence of hemodynamically significant valvular heart disease. No evidence of vegetation/infective endocarditis on this transesophageal echocardiogram. FINDINGS  Left Ventricle: Left ventricular ejection fraction, by estimation, is 60 to 65%. The left ventricle has  normal function. The left ventricle has no regional wall motion abnormalities. The left ventricular internal cavity size was normal in size. There is  no left ventricular hypertrophy. Left ventricular diastolic function could not be evaluated. Right Ventricle: The right ventricular size is normal. No increase in right ventricular wall thickness. Right ventricular systolic function is normal. Left Atrium: Left atrial size was normal in size. No left atrial/left atrial appendage thrombus was detected. Right Atrium: Right atrial size was normal in size. Pericardium: There is no evidence of pericardial effusion. Mitral Valve: The mitral valve is normal in structure. Trivial mitral valve regurgitation. There is no evidence of mitral valve vegetation. Tricuspid Valve: The tricuspid valve is normal in structure. Tricuspid valve regurgitation is trivial. There is no evidence of tricuspid valve vegetation. Aortic Valve: The aortic valve is tricuspid. Aortic valve regurgitation is trivial. There is no evidence of aortic valve vegetation. Pulmonic Valve: The pulmonic valve was grossly normal. Pulmonic valve regurgitation is trivial. There is no evidence of pulmonic valve vegetation. Aorta: The aortic root is normal in size and structure. IAS/Shunts: No atrial level shunt detected by color flow Doppler. Agitated saline contrast was  given intravenously to evaluate for intracardiac shunting. There is no evidence of a patent foramen ovale. There is no evidence of an atrial septal defect. Harold Hedge MD Electronically signed by Harold Hedge MD Signature Date/Time: 09/20/2021/12:51:35 PM    Final    ECHOCARDIOGRAM LIMITED  Result Date: 09/18/2021    ECHOCARDIOGRAM LIMITED REPORT   Patient Name:   ASKIA HAZELIP Date of Exam: 09/18/2021 Medical Rec #:  [id number]     Height:       73.0 in Accession #:    [id number]    Weight:       226.6 lb Date of Birth:  Mar 24, 1956    BSA:          2.269 m Patient Age:    65 years      BP:           113/80 mmHg Patient Gender: M             HR:           65 bpm. Exam Location:  [facility] Procedure: Limited Echo, Color Doppler and Cardiac Doppler Indications:     Bacteremia R78.81  History:         Patient has prior history of Echocardiogram examinations, most                  recent 07/23/2021. COPD, Arrythmias:Atrial Fibrillation; Risk                  Factors:Diabetes.  Sonographer:     Cristela Blue Referring Phys:  [id number] Lurene Shadow Diagnosing Phys: Yvonne Kendall MD  Sonographer Comments: Suboptimal apical window. IMPRESSIONS  1. Left ventricular ejection fraction, by estimation, is 60 to 65%. The left ventricle has normal function. The left ventricle has no regional wall motion abnormalities. There is mild left ventricular hypertrophy. Left ventricular diastolic function could not be evaluated.  2. Right ventricular systolic function was not well visualized. The right ventricular size is mildly enlarged. Tricuspid regurgitation signal is inadequate for assessing PA pressure.  3. Right atrial size was severely dilated.  4. The mitral valve is abnormal. Trivial mitral valve regurgitation. No evidence of mitral stenosis.  5. The aortic valve is tricuspid. There is mild calcification of the aortic valve. There is mild thickening of the aortic valve. Mild aortic valve  sclerosis is present, with no evidence of  aortic valve stenosis.  6. There is mild dilatation of the ascending aorta, measuring 38 mm. FINDINGS  Left Ventricle: Left ventricular ejection fraction, by estimation, is 60 to 65%. The left ventricle has normal function. The left ventricle has no regional wall motion abnormalities. The left ventricular internal cavity size was normal in size. There is  mild left ventricular hypertrophy. Left ventricular diastolic function could not be evaluated. Left ventricular diastolic function could not be evaluated due to atrial fibrillation. Right Ventricle: The right ventricular size is mildly enlarged. Right ventricular systolic function was not well visualized. Tricuspid regurgitation signal is inadequate for assessing PA pressure. Left Atrium: Left atrial size was normal in size. Right Atrium: Right atrial size was severely dilated. Pericardium: There is no evidence of pericardial effusion. Mitral Valve: The mitral valve is abnormal. There is mild thickening of the mitral valve leaflet(s). Mild to moderate mitral annular calcification. Trivial mitral valve regurgitation. No evidence of mitral valve stenosis. MV peak gradient, 4.4 mmHg. The mean mitral valve gradient is 2.0 mmHg. Tricuspid Valve: The tricuspid valve is normal in structure. Tricuspid valve regurgitation is trivial. Aortic Valve: The aortic valve is tricuspid. There is mild calcification of the aortic valve. There is mild thickening of the aortic valve. Mild aortic valve sclerosis is present, with no evidence of aortic valve stenosis. Aortic valve mean gradient measures 2.0 mmHg. Aortic valve peak gradient measures 3.7 mmHg. Aortic valve area, by VTI measures 3.67 cm. Pulmonic Valve: The pulmonic valve was not well visualized. Pulmonic valve regurgitation is not visualized. No evidence of pulmonic stenosis. Aorta: The aortic root is normal in size and structure. There is mild dilatation of the ascending aorta, measuring 38 mm. Pulmonary Artery: The  pulmonary artery is of normal size. Venous: The inferior vena cava was not well visualized. LEFT VENTRICLE PLAX 2D LVIDd:         4.40 cm LVIDs:         2.90 cm LV PW:         1.20 cm LV IVS:        1.05 cm LVOT diam:     2.30 cm LV SV:         62 LV SV Index:   27 LVOT Area:     4.15 cm  RIGHT VENTRICLE RV Basal diam:  4.80 cm RV S prime:     14.30 cm/s LEFT ATRIUM             Index        RIGHT ATRIUM           Index LA diam:        4.20 cm 1.85 cm/m   RA Area:     36.60 cm LA Vol (A2C):   82.2 ml 36.23 ml/m  RA Volume:   152.00 ml 66.99 ml/m LA Vol (A4C):   50.2 ml 22.13 ml/m LA Biplane Vol: 70.4 ml 31.03 ml/m  AORTIC VALVE                    PULMONIC VALVE AV Area (Vmax):    3.55 cm     PV Vmax:        0.76 m/s AV Area (Vmean):   3.26 cm     PV Vmean:       54.100 cm/s AV Area (VTI):     3.67 cm     PV VTI:  0.144 m AV Vmax:           96.10 cm/s   PV Peak grad:   2.3 mmHg AV Vmean:          68.000 cm/s  PV Mean grad:   1.0 mmHg AV VTI:            0.170 m      RVOT Peak grad: 4 mmHg AV Peak Grad:      3.7 mmHg AV Mean Grad:      2.0 mmHg LVOT Vmax:         82.20 cm/s LVOT Vmean:        53.400 cm/s LVOT VTI:          0.150 m LVOT/AV VTI ratio: 0.88  AORTA Ao Root diam: 3.70 cm MITRAL VALVE MV Area (PHT): 3.81 cm    SHUNTS MV Area VTI:   2.31 cm    Systemic VTI:  0.15 m MV Peak grad:  4.4 mmHg    Systemic Diam: 2.30 cm MV Mean grad:  2.0 mmHg    Pulmonic VTI:  0.186 m MV Vmax:       1.05 m/s MV Vmean:      66.5 cm/s MV Decel Time: 199 msec MV E velocity: 89.10 cm/s Nelva Bush MD Electronically signed by Nelva Bush MD Signature Date/Time: 09/18/2021/2:50:51 PM    Final    [place] EKG SITE RITE  Result Date: 09/25/2021 If Site Rite image not attached, placement could not be confirmed due to current cardiac rhythm.  [place] EKG SITE RITE  Result Date: 09/23/2021 If Site Rite image not attached, placement could not be confirmed due to current cardiac rhythm.  DG FL GUIDED LUMBAR  PUNCTURE  Result Date: 09/18/2021 CLINICAL DATA:  Altered mental status with a fever and bacteremia, request received for diagnostic lumbar puncture. EXAM: DIAGNOSTIC LUMBAR PUNCTURE UNDER FLUOROSCOPIC GUIDANCE COMPARISON:  CT head performed 09/17/2021 report reviewed prior to the procedure. FLUOROSCOPY TIME:  Fluoroscopy Time:  0.2 minute Radiation Exposure Index (if provided by the fluoroscopic device): 9.6 mGy Number of Acquired Spot Images: 2 PROCEDURE: Informed consent was obtained from the patient's legal guardian prior to the procedure, including potential complications of bleeding, infection, CSF leak requiring additional procedures, paresthesias, postprocedure requirements including the need for the patient to lay flat for several hours after the procedure, headache, allergy, and pain. With the patient prone, the lower back was prepped with Betadine. 1% Lidocaine was used for local anesthesia. Lumbar puncture was performed at the L3-L4 level using a 20 gauge needle with return of colorless CSF with an opening pressure of 17 cm water. Nine ml of CSF were obtained for laboratory studies. The patient tolerated the procedure well and there were no apparent complications. A sterile bandage was applied. IMPRESSION: Technically successful fluoroscopic guided lumbar puncture. Read By: Tsosie Billing PA-C Electronically Signed   By: Delanna Ahmadi M.D.   On: 09/18/2021 14:24      Subjective: Seen and examined on the day of discharge.  Stable no distress.  Stable for discharge to skilled nursing facility.  Discharge Exam: Vitals:   09/27/21 0411 09/27/21 0722  BP: 117/66 107/68  Pulse: (!) 48 (!) 51  Resp: 20   Temp: 97.7 F (36.5 C) 97.9 F (36.6 C)  SpO2: 98% 100%   Vitals:   09/26/21 0740 09/26/21 2019 09/27/21 0411 09/27/21 0722  BP: 120/74 (!) 142/86 117/66 107/68  Pulse: (!) 43 (!) 51 (!) 48 (!) 51  Resp:  16 20   Temp: 97.6 F (36.4 C) 97.8 F (36.6 C) 97.7 F (36.5 C) 97.9 F (36.6  C)  TempSrc:  Oral Oral Oral  SpO2: 100% 100% 98% 100%  Weight:      Height:        General: Pt is alert, awake, not in acute distress Cardiovascular: RRR, S1/S2 +, no rubs, no gallops Respiratory: CTA bilaterally, no wheezing, no rhonchi Abdominal: Soft, NT, ND, bowel sounds + Extremities: no edema, no cyanosis, RUE PICC    The results of significant diagnostics from this hospitalization (including imaging, microbiology, ancillary and laboratory) are listed below for reference.     Microbiology: Recent Results (from the past 240 hour(s))  CSF culture w Gram Stain     Status: None   Collection Time: 09/18/21  2:14 PM   Specimen: PATH Cytology CSF; Cerebrospinal Fluid  Result Value Ref Range Status   Specimen Description   Final    CSF Performed at [facility], [address], [place], [place] [address]    Special Requests   Final    NONE Performed at [facility], [address] [place], [place] [address]    Gram Stain   Final    WBC PRESENT, PREDOMINANTLY MONONUCLEAR NO ORGANISMS SEEN CYTOSPIN SMEAR    Culture   Final    NO GROWTH Performed at [facility], [place] [address] [place], [place] [address]    Report Status 09/22/2021 FINAL  Final  CULTURE, BLOOD (ROUTINE X 2) w Reflex to ID Panel     Status: None   Collection Time: 09/20/21  6:50 AM   Specimen: BLOOD  Result Value Ref Range Status   Specimen Description BLOOD RIGHT ARM  Final   Special Requests   Final    BOTTLES DRAWN AEROBIC AND ANAEROBIC Blood Culture adequate volume   Culture   Final    NO GROWTH 5 DAYS Performed at [facility], [address] [place], [place] [address]    Report Status 09/25/2021 FINAL  Final  CULTURE, BLOOD (ROUTINE X 2) w Reflex to ID Panel     Status: None   Collection Time: 09/20/21  6:55 AM   Specimen: BLOOD  Result Value Ref Range Status   Specimen Description BLOOD RIGHT HAND  Final   Special Requests   Final    BOTTLES DRAWN  AEROBIC AND ANAEROBIC Blood Culture adequate volume   Culture   Final    NO GROWTH 5 DAYS Performed at [facility], [address] [place], [place] [address]    Report Status 09/25/2021 FINAL  Final  Resp Panel by RT-PCR (Flu A&B, Covid) Nasopharyngeal Swab     Status: None   Collection Time: 09/26/21  2:13 PM   Specimen: Nasopharyngeal Swab; Nasopharyngeal(NP) swabs in vial transport medium  Result Value Ref Range Status   SARS Coronavirus 2 by RT PCR NEGATIVE NEGATIVE Final    Comment: (NOTE) SARS-CoV-2 target nucleic acids are NOT DETECTED.  The SARS-CoV-2 RNA is generally detectable in upper respiratory specimens during the acute phase of infection. The lowest concentration of SARS-CoV-2 viral copies this assay can detect is 138 copies/mL. A negative result does not preclude SARS-Cov-2 infection and should not be used as the sole basis for treatment or other patient management decisions. A negative result may occur with  improper specimen collection/handling, submission of specimen other than nasopharyngeal swab, presence of viral mutation(s) within the areas targeted by this assay, and inadequate  number of viral copies(<138 copies/mL). A negative result must be combined with clinical observations, patient history, and epidemiological information. The expected result is Negative.  Fact Sheet for Patients:  EntrepreneurPulse.com.au  Fact Sheet for Healthcare Providers:  IncredibleEmployment.be  This test is no t yet approved or cleared by the [place] FDA and  has been authorized for detection and/or diagnosis of SARS-CoV-2 by FDA under an Emergency Use Authorization (EUA). This EUA will remain  in effect (meaning this test can be used) for the duration of the COVID-19 declaration under Section 564(b)(1) of the Act, 21 U.S.C.section 360bbb-3(b)(1), unless the authorization is terminated  or revoked sooner.        Influenza A by PCR NEGATIVE NEGATIVE Final   Influenza B by PCR NEGATIVE NEGATIVE Final    Comment: (NOTE) The Xpert Xpress SARS-CoV-2/FLU/RSV plus assay is intended as an aid in the diagnosis of influenza from Nasopharyngeal swab specimens and should not be used as a sole basis for treatment. Nasal washings and aspirates are unacceptable for Xpert Xpress SARS-CoV-2/FLU/RSV testing.  Fact Sheet for Patients: EntrepreneurPulse.com.au  Fact Sheet for Healthcare Providers: IncredibleEmployment.be  This test is not yet approved or cleared by the [place] FDA and has been authorized for detection and/or diagnosis of SARS-CoV-2 by FDA under an Emergency Use Authorization (EUA). This EUA will remain in effect (meaning this test can be used) for the duration of the COVID-19 declaration under Section 564(b)(1) of the Act, 21 U.S.C. section 360bbb-3(b)(1), unless the authorization is terminated or revoked.  Performed at [facility], [address] [place], [place] [address]      Labs: BNP (last 3 results) No results for input(s): BNP in the last 8760 hours. Basic Metabolic Panel: Recent Labs  Lab 09/21/21 0736  NA 136  K 4.6  CL 104  CO2 26  GLUCOSE 99  BUN 16  CREATININE 0.95  CALCIUM 8.6*   Liver Function Tests: No results for input(s): AST, ALT, ALKPHOS, BILITOT, PROT, ALBUMIN in the last 168 hours. No results for input(s): LIPASE, AMYLASE in the last 168 hours. No results for input(s): AMMONIA in the last 168 hours. CBC: Recent Labs  Lab 09/21/21 0736 09/22/21 1854 09/23/21 0131  WBC 6.0  --   --   NEUTROABS 1.8  --   --   HGB 9.1* 10.0* 9.8*  HCT 28.1*  --   --   MCV 102.9*  --   --   PLT 124*  --   --    Cardiac Enzymes: No results for input(s): CKTOTAL, CKMB, CKMBINDEX, TROPONINI in the last 168 hours. BNP: Invalid input(s): POCBNP CBG: Recent Labs  Lab 09/27/21 0000 09/27/21 0408 09/27/21 0503  09/27/21 0635 09/27/21 0720  GLUCAP 132* 75 70 93 91   D-Dimer No results for input(s): DDIMER in the last 72 hours. Hgb A1c No results for input(s): HGBA1C in the last 72 hours. Lipid Profile No results for input(s): CHOL, HDL, LDLCALC, TRIG, CHOLHDL, LDLDIRECT in the last 72 hours. Thyroid function studies No results for input(s): TSH, T4TOTAL, T3FREE, THYROIDAB in the last 72 hours.  Invalid input(s): FREET3 Anemia work up No results for input(s): VITAMINB12, FOLATE, FERRITIN, TIBC, IRON, RETICCTPCT in the last 72 hours. Urinalysis    Component Value Date/Time   COLORURINE YELLOW (A) 09/17/2021 0200   APPEARANCEUR CLEAR (A) 09/17/2021 0200   APPEARANCEUR Clear 03/14/2016 1027   LABSPEC 1.023 09/17/2021 0200   LABSPEC 1.017 01/29/2014 1545   PHURINE 7.0 09/17/2021 0200  GLUCOSEU NEGATIVE 09/17/2021 0200   GLUCOSEU Negative 01/29/2014 1545   HGBUR NEGATIVE 09/17/2021 0200   BILIRUBINUR NEGATIVE 09/17/2021 0200   BILIRUBINUR Negative 03/14/2016 1027   BILIRUBINUR Negative 01/29/2014 1545   KETONESUR 5 (A) 09/17/2021 0200   PROTEINUR 30 (A) 09/17/2021 0200   NITRITE NEGATIVE 09/17/2021 0200   LEUKOCYTESUR NEGATIVE 09/17/2021 0200   LEUKOCYTESUR Negative 01/29/2014 1545   Sepsis Labs Invalid input(s): PROCALCITONIN,  WBC,  LACTICIDVEN Microbiology Recent Results (from the past 240 hour(s))  CSF culture w Gram Stain     Status: None   Collection Time: 09/18/21  2:14 PM   Specimen: PATH Cytology CSF; Cerebrospinal Fluid  Result Value Ref Range Status   Specimen Description   Final    CSF Performed at [facility], [address], [place], [place] [address]    Special Requests   Final    NONE Performed at [facility], [address] [place], [place] [address]    Gram Stain   Final    WBC PRESENT, PREDOMINANTLY MONONUCLEAR NO ORGANISMS SEEN CYTOSPIN SMEAR    Culture   Final    NO GROWTH Performed at [facility], [place] [address], [place], [place] [address]    Report Status 09/22/2021 FINAL  Final  CULTURE, BLOOD (ROUTINE X 2) w Reflex to ID Panel     Status: None   Collection Time: 09/20/21  6:50 AM   Specimen: BLOOD  Result Value Ref Range Status   Specimen Description BLOOD RIGHT ARM  Final   Special Requests   Final    BOTTLES DRAWN AEROBIC AND ANAEROBIC Blood Culture adequate volume   Culture   Final    NO GROWTH 5 DAYS Performed at [facility], [address] [place], [place] [address]    Report Status 09/25/2021 FINAL  Final  CULTURE, BLOOD (ROUTINE X 2) w Reflex to ID Panel     Status: None   Collection Time: 09/20/21  6:55 AM   Specimen: BLOOD  Result Value Ref Range Status   Specimen Description BLOOD RIGHT HAND  Final   Special Requests   Final    BOTTLES DRAWN AEROBIC AND ANAEROBIC Blood Culture adequate volume   Culture   Final    NO GROWTH 5 DAYS Performed at [facility], [address] [place],  [address]    Report Status 09/25/2021 FINAL  Final  Resp Panel by RT-PCR (Flu A&B, Covid) Nasopharyngeal Swab     Status: None   Collection Time: 09/26/21  2:13 PM   Specimen: Nasopharyngeal Swab; Nasopharyngeal(NP) swabs in vial transport medium  Result Value Ref Range Status   SARS Coronavirus 2 by RT PCR NEGATIVE NEGATIVE Final    Comment: (NOTE) SARS-CoV-2 target nucleic acids are NOT DETECTED.  The SARS-CoV-2 RNA is generally detectable in upper respiratory specimens during the acute phase of infection. The lowest concentration of SARS-CoV-2 viral copies this assay can detect is 138 copies/mL. A negative result does not preclude SARS-Cov-2 infection and should not be used as the sole basis for treatment or other patient management decisions. A negative result may occur with  improper specimen collection/handling, submission of specimen other than nasopharyngeal swab, presence of viral mutation(s) within the areas targeted by this assay, and inadequate number  of viral copies(<138 copies/mL). A negative result must be combined with clinical observations, patient history, and epidemiological information. The expected result is Negative.  Fact Sheet for Patients:  EntrepreneurPulse.com.au  Fact Sheet for  Healthcare Providers:  IncredibleEmployment.be  This test is no t yet approved or cleared by the [place] and  has been authorized for detection and/or diagnosis of SARS-CoV-2 by FDA under an Emergency Use Authorization (EUA). This EUA will remain  in effect (meaning this test can be used) for the duration of the COVID-19 declaration under Section 564(b)(1) of the Act, 21 U.S.C.section 360bbb-3(b)(1), unless the authorization is terminated  or revoked sooner.       Influenza A by PCR NEGATIVE NEGATIVE Final   Influenza B by PCR NEGATIVE NEGATIVE Final    Comment: (NOTE) The Xpert Xpress SARS-CoV-2/FLU/RSV plus assay is intended as an aid in the diagnosis of influenza from Nasopharyngeal swab specimens and should not be used as a sole basis for treatment. Nasal washings and aspirates are unacceptable for Xpert Xpress SARS-CoV-2/FLU/RSV testing.  Fact Sheet for Patients: EntrepreneurPulse.com.au  Fact Sheet for Healthcare Providers: IncredibleEmployment.be  This test is not yet approved or cleared by the [place] FDA and has been authorized for detection and/or diagnosis of SARS-CoV-2 by FDA under an Emergency Use Authorization (EUA). This EUA will remain in effect (meaning this test can be used) for the duration of the COVID-19 declaration under Section 564(b)(1) of the Act, 21 U.S.C. section 360bbb-3(b)(1), unless the authorization is terminated or revoked.  Performed at [facility], [address] [place], [place] [address]      Time coordinating discharge: Over 30 minutes  SIGNED:   Desma Maxim, MD  [facility] 09/27/2021, 9:58 AM Pager   If 7PM-7AM, please contact night-coverage

## 2021-09-27 LAB — GLUCOSE, CAPILLARY
Glucose-Capillary: 132 mg/dL — ABNORMAL HIGH (ref 70–99)
Glucose-Capillary: 182 mg/dL — ABNORMAL HIGH (ref 70–99)
Glucose-Capillary: 70 mg/dL (ref 70–99)
Glucose-Capillary: 75 mg/dL (ref 70–99)
Glucose-Capillary: 91 mg/dL (ref 70–99)
Glucose-Capillary: 93 mg/dL (ref 70–99)

## 2021-09-27 NOTE — TOC Transition Note (Signed)
Transition of Care [facility]) - CM/SW Discharge Note   Patient Details  Name: Brian Dodson MRN: [id number] Date of Birth: 24-Mar-1956  [facility] CM/SW Contact:  [name], LCSW Phone Number: 09/27/2021, 2:41 PM   Clinical Narrative:  Patient has orders to discharge to [facility] today. RN has already called report. DSS social worker, Sandrea Hughs, will pick patient up at 3:30 and take him to [facility]. No further concerns. CSW signing off.   Final next level of care: Skilled Nursing Facility Barriers to Discharge: Barriers Resolved   Patient Goals and CMS Choice Patient states their goals for this hospitalization and ongoing recovery are:: patient wants to go back to [place] when medically ready   Choice offered to / list presented to : [facility] POA / Guardian  Discharge Placement PASRR number recieved: 09/26/21            Patient chooses bed at: [facility] [place] Patient to be transferred to facility by: DSS will transport Name of family member notified: [name] - DSS, Rufina Falco - Niece Patient and family notified of of transfer: 09/27/21  Discharge Plan and Services   Discharge Planning Services: CM Consult Post Acute Care Choice: Resumption of Svcs/PTA Provider          DME Arranged: N/A DME Agency: NA       HH Arranged: NA          Social Determinants of Health (SDOH) Interventions     Readmission Risk Interventions Readmission Risk Prevention Plan 09/19/2021  Transportation Screening Complete  PCP or Specialist Appt within 3-5 Days Complete  HRI or Home Care Consult Complete  Social Work Consult for Recovery Care Planning/Counseling Complete  Palliative Care Screening Not Applicable  Medication Review Oceanographer) Complete  Some recent data might be hidden

## 2021-09-27 NOTE — Progress Notes (Signed)
   09/27/21 1145  Clinical Encounter Type  Visited With Patient not available  Visit Type Initial  Referral From Chaplain  Consult/Referral To Chaplain   Chaplain attempted to visit but PT was not available (asleep).  Posey Boyer, M. Div.

## 2021-09-27 NOTE — Plan of Care (Signed)

## 2021-09-27 NOTE — TOC Progression Note (Addendum)
Transition of Care [facility]) - Progression Note    Patient Details  Name: Brian Dodson MRN: [id number] Date of Birth: 06/02/1956  Transition of Care [facility]) CM/SW Contact  [name], LCSW Phone Number: 09/27/2021, 9:09 AM  Clinical Narrative:  No email response or call back from [name] at [facility]. Emailed her this morning to see if they can transport and what time they would be able to do so.   11:27 am: Per [facility] admissions coordinator, will have to delay discharge to tomorrow. MD aware. Emailed Milinda Pointer to see if we can go ahead and schedule time for transport.  12:56 pm: [facility] had a bed [facility] up today. DSS will pick patient up around 3:30. Sent secure chat to MD and RN to notify.  Expected Discharge Plan: Assisted Living Barriers to Discharge: Continued Medical Work up  Expected Discharge Plan and Services Expected Discharge Plan: Assisted Living   Discharge Planning Services: CM Consult Post Acute Care Choice: Resumption of Svcs/PTA Provider Living arrangements for the past 2 months: Assisted Living Facility                 DME Arranged: N/A DME Agency: NA       HH Arranged: NA           Social Determinants of Health (SDOH) Interventions    Readmission Risk Interventions Readmission Risk Prevention Plan 09/19/2021  Transportation Screening Complete  PCP or Specialist Appt within 3-5 Days Complete  HRI or Home Care Consult Complete  Social Work Consult for Recovery Care Planning/Counseling Complete  Palliative Care Screening Not Applicable  Medication Review Oceanographer) Complete  Some recent data might be hidden

## 2021-09-27 NOTE — Progress Notes (Signed)
Mobility Specialist - Progress Note   09/27/21 1500  Mobility  Activity Ambulated in hall  Level of Assistance Standby assist, set-up cues, supervision of patient - no hands on  Assistive Device Other (Comment) (HHA)  Distance Ambulated (ft) 640 ft  Mobility Ambulated with assistance in hallway  Mobility Response Tolerated well  Mobility performed by Mobility specialist  $Mobility charge 1 Mobility    Pt ambulated in hallway with HHA.    Filiberto Pinks Mobility Specialist 09/27/21, 3:52 PM

## 2021-09-27 NOTE — Progress Notes (Signed)
Report called to [facility], report given to [facility].  Patient made aware of transfer to facility this afternoon.

## 2021-10-10 ENCOUNTER — Ambulatory Visit: Payer: Medicare Other | Attending: Infectious Diseases | Admitting: Infectious Diseases

## 2021-10-10 ENCOUNTER — Other Ambulatory Visit: Payer: Self-pay

## 2021-10-10 VITALS — BP 109/70 | HR 78 | Temp 97.8°F | Wt 224.1 lb

## 2021-10-10 DIAGNOSIS — I480 Paroxysmal atrial fibrillation: Secondary | ICD-10-CM | POA: Diagnosis not present

## 2021-10-10 DIAGNOSIS — Z79899 Other long term (current) drug therapy: Secondary | ICD-10-CM | POA: Diagnosis not present

## 2021-10-10 DIAGNOSIS — B955 Unspecified streptococcus as the cause of diseases classified elsewhere: Secondary | ICD-10-CM

## 2021-10-10 DIAGNOSIS — R7881 Bacteremia: Secondary | ICD-10-CM | POA: Insufficient documentation

## 2021-10-10 DIAGNOSIS — F209 Schizophrenia, unspecified: Secondary | ICD-10-CM | POA: Diagnosis not present

## 2021-10-10 DIAGNOSIS — Z7901 Long term (current) use of anticoagulants: Secondary | ICD-10-CM | POA: Insufficient documentation

## 2021-10-10 NOTE — Progress Notes (Signed)
The purpose of this virtual visit is to provide medical care while limiting exposure to the novel coronavirus (COVID19) for both patient and office staff.   Consent was obtained for phone visit:  Yes.   Answered questions that patient had about telehealth interaction:  Yes.   I discussed the limitations, risks, security and privacy concerns of performing an evaluation and management service by telephone. I also discussed with the patient that there may be a patient responsible charge related to this service. The patient expressed understanding and agreed to proceed.   [facility] Location: [facility] Provider Location: office People on the call- Brian Dodson his nurse, Brian Dodson CMA and [name]  Follow up visit after recent hospitalization 10/30-11/9/22 for altered mental status, fever and Group G streptococcus bacteremia on 09/17/21 No source identified Csf examination N Neg TEE NEG colonoscopy Repeat blood culture 09/20/21 Neg CT chest/abd/pelvis- Neg CT head-Mild chronic small vessel ischemia. He was discharged to [facility] on IV ceftriaxone to complete 30 days- end date 10/18/21 He has h/o Schizophrenia, Paroxysmal afib  Pt is doing well [facility] is at [facility] He is 100% adherent to IV ceftriaxone He has no fever, chills, nausea, vomiting, diarrhea, pain abdomen, chest pain, cough or SOB No rash, or itching  Meds   Labs from 11/14 Cr 1.16 Hb 10.4 WBC 6.3 MCV 106 PLT   Meds Tylenol PRN Amiodarone 200mg  Qd Vitamin c  250 Aspirin 81 mg Atorvastatin 10mg  Benzotropine 1 mg Q HS Biofreeze PRN Timolol eyedrops IV ceftriaxone 2 grams IV  Citaloprm 20mg  Depakote 2000mg  Q hs Doxazepin 10mg  Eliquis 5 BID Feso4 Lasix 20g Melatonin at bed time Metfomin 500mg  BID Omeprazole 20mg  BID Calcium 500mg  BID Risperdol 4 mg q HS Vit D3 Voltaren gel PRN  As per his nurse Brian Dodson  Vitals 109/70 HR  Weight 224.1 Temp 97.8 HR 78/  No physical  examination  Impression/recommendation  Streptococcus Group G bacteremia - recurrent  Unclear source- no harware TEE no endocarditis CT abd/pelvis N CSF N Colonoscopy normal Is getting 30 days of Iv ceftriaxone - he will finsih on 09/3021 and PICC will be removed  Paroxysmal afib on eliquis and amiodarone Schizophrenia on Depakote, Cogentin and risperdal  Pt currently at [facility] Discussed with him and his nurse Follow PRN

## 2021-10-18 ENCOUNTER — Encounter: Payer: Self-pay | Admitting: Emergency Medicine

## 2021-10-18 DIAGNOSIS — E119 Type 2 diabetes mellitus without complications: Secondary | ICD-10-CM | POA: Insufficient documentation

## 2021-10-18 DIAGNOSIS — Z452 Encounter for adjustment and management of vascular access device: Secondary | ICD-10-CM | POA: Diagnosis not present

## 2021-10-18 DIAGNOSIS — J45909 Unspecified asthma, uncomplicated: Secondary | ICD-10-CM | POA: Diagnosis not present

## 2021-10-18 DIAGNOSIS — Z87891 Personal history of nicotine dependence: Secondary | ICD-10-CM | POA: Diagnosis not present

## 2021-10-18 DIAGNOSIS — I2510 Atherosclerotic heart disease of native coronary artery without angina pectoris: Secondary | ICD-10-CM | POA: Insufficient documentation

## 2021-10-18 DIAGNOSIS — Z7901 Long term (current) use of anticoagulants: Secondary | ICD-10-CM | POA: Insufficient documentation

## 2021-10-18 DIAGNOSIS — F0390 Unspecified dementia without behavioral disturbance: Secondary | ICD-10-CM | POA: Diagnosis not present

## 2021-10-18 DIAGNOSIS — J449 Chronic obstructive pulmonary disease, unspecified: Secondary | ICD-10-CM | POA: Insufficient documentation

## 2021-10-18 DIAGNOSIS — Z7984 Long term (current) use of oral hypoglycemic drugs: Secondary | ICD-10-CM | POA: Diagnosis not present

## 2021-10-18 DIAGNOSIS — I4891 Unspecified atrial fibrillation: Secondary | ICD-10-CM | POA: Diagnosis not present

## 2021-10-18 DIAGNOSIS — I10 Essential (primary) hypertension: Secondary | ICD-10-CM | POA: Insufficient documentation

## 2021-10-18 DIAGNOSIS — Z79899 Other long term (current) drug therapy: Secondary | ICD-10-CM | POA: Insufficient documentation

## 2021-10-18 DIAGNOSIS — Z7982 Long term (current) use of aspirin: Secondary | ICD-10-CM | POA: Diagnosis not present

## 2021-10-18 NOTE — ED Triage Notes (Addendum)
Pt arrived via [facility] from [facility] facility due to PICC line removal by pt. Facility unaware of when removed. Per documentation line was placed on 11/7. No blood seen on pt or in room, per staff or EMS. PICC site not bleeding, skin dry. Pt confused at baseline. ((Per facility, pt sent to ED tonight for x-ray because the PICC line goes all the way to the heart.)) When pt asked when removed, he sts, "I think a couple of days ago it fell out."

## 2021-10-19 ENCOUNTER — Emergency Department
Admission: EM | Admit: 2021-10-19 | Discharge: 2021-10-19 | Disposition: A | Discharge status: Home / Self Care | Payer: Medicare Other | Attending: Emergency Medicine | Admitting: Emergency Medicine

## 2021-10-19 DIAGNOSIS — Z452 Encounter for adjustment and management of vascular access device: Secondary | ICD-10-CM

## 2021-10-19 NOTE — ED Provider Notes (Signed)
[facility] Emergency Department Provider Note   ____________________________________________   Event Date/Time   First MD Initiated Contact with Patient 10/19/21 [phone]     (approximate)  I have reviewed the triage vital signs and the nursing notes.   HISTORY  Chief Complaint Vascular Access Problem    HPI Brian Dodson is a 65 y.o. male with past medical history of hypertension, diabetes, atrial fibrillation on Eliquis, CAD, intellectual disability, and schizophrenia presents to the ED for displaced PICC line.  History is limited due to patient's baseline cognitive deficits.  He currently resides at [facility] and has been receiving ceftriaxone via PICC line for the past month due to group B strep bacteremia.  EMS was called this evening after patient had removed his PICC line.  Patient is unsure exactly when the PICC line was removed, currently states he feels well without any complaints.  Per paperwork, patient had received his dose of Rocephin yesterday as usual.        Past Medical History:  Diagnosis Date   Asthma    Atrial fibrillation ([place])    Benign prostatic hypertrophy with lower urinary tract symptoms (LUTS)    Bulbous urethral stricture    CAD (coronary artery disease)    s/p coronary stent 2003   Constipation    COPD (chronic obstructive pulmonary disease) ([place])    Diabetes mellitus without complication (HCC)    Dizziness    Dyspnea    GERD (gastroesophageal reflux disease)    Gross hematuria    Hyperlipemia    Hypertension    Lumbago    Palpitation    Schizophrenia ([place])     Patient Active Problem List   Diagnosis Date Noted   Streptococcal bacteremia 09/18/2021   AKI (acute kidney injury) ([place]) 09/17/2021   Cellulitis 08/15/2021   Sepsis secondary to UTI ([place]) [id number]   Acute metabolic encephalopathy [id number]   Dementia ([place]) 03/05/2021   Memory loss due to medical condition 03/05/2021   AF (paroxysmal atrial  fibrillation) ([place]) 03/05/2021   Atrial flutter ([place]) 03/05/2021   Diabetes mellitus type 2, controlled, without complications ([place]) [id number]   Obese 03/05/2021   Anemia of chronic disease 03/04/2021   Acute lower UTI 03/02/2021   Acute febrile illness    Bilateral lower extremity edema 04/01/2018   Lower extremity pain, bilateral 04/01/2018   Swelling of limb 10/04/2017   Sepsis ([place]) 05/20/2017   Anemia 05/07/2017   B12 deficiency 05/07/2017   Loss of memory 05/07/2017   Atrial fibrillation with RVR ([place]) 05/23/2015   Syncope 05/23/2015   Schizophrenia ([place]) 05/23/2015   Diabetes mellitus, type II ([place]) 05/23/2015   HTN (hypertension) 05/23/2015   GERD (gastroesophageal reflux disease) 05/23/2015   CAD (coronary artery disease) 05/23/2015    Past Surgical History:  Procedure Laterality Date   COLONOSCOPY     COLONOSCOPY WITH PROPOFOL N/A 09/22/2021   Procedure: COLONOSCOPY WITH PROPOFOL;  Surgeon: Lesly Rubenstein, MD;  Location: [facility] ENDOSCOPY;  Service: Endoscopy;  Laterality: N/A;   CORONARY ANGIOPLASTY WITH STENT PLACEMENT  11/2003   kidney stent     TEE WITHOUT CARDIOVERSION N/A 09/20/2021   Procedure: TRANSESOPHAGEAL ECHOCARDIOGRAM (TEE);  Surgeon: Teodoro Spray, MD;  Location: [facility] ORS;  Service: Cardiovascular;  Laterality: N/A;    Prior to Admission medications   Medication Sig Start Date End Date Taking? Authorizing Provider  acetaminophen (TYLENOL) 500 MG tablet Take 1,000 mg by mouth every 6 (six) hours as needed for mild pain.  [provider]  amiodarone (PACERONE) 200 MG tablet Take 1 tablet (200 mg total) by mouth daily. 07/26/21   Charise Killian, MD  aspirin 81 MG tablet Take 81 mg by mouth daily.     [provider]  atorvastatin (LIPITOR) 10 MG tablet Take 10 mg by mouth daily. 05/18/15   [provider]  benztropine (COGENTIN) 1 MG tablet Take 1 mg by mouth at bedtime.     [provider]  brimonidine-timolol  (COMBIGAN) 0.2-0.5 % ophthalmic solution Place 1 drop into both eyes every 12 (twelve) hours. Wait 3 to 5 minutes between drops    [provider]  Cholecalciferol (EQL VITAMIN D3) 50 MCG (2000 UT) CAPS Take 1 capsule by mouth daily.    [provider]  citalopram (CELEXA) 20 MG tablet Take 20 mg by mouth daily.    [provider]  diclofenac Sodium (VOLTAREN) 1 % GEL Apply 2 g topically 4 (four) times daily as needed. Apply to neck and shoulders 08/18/21   Lurene Shadow, MD  divalproex (DEPAKOTE ER) 500 MG 24 hr tablet Take 2,000 mg by mouth at bedtime.     [provider]  donepezil (ARICEPT) 10 MG tablet Take 10 mg by mouth at bedtime.     [provider]  ELIQUIS 5 MG TABS tablet Take 5 mg by mouth 2 (two) times daily. 05/18/15   [provider]  ferrous sulfate 325 (65 FE) MG tablet Take 325 mg by mouth daily with breakfast.    [provider]  furosemide (LASIX) 20 MG tablet Take 20 mg by mouth daily as needed. For edema (swelling) 06/22/21   [provider]  lanolin/mineral oil (KERI/THERA-DERM) LOTN Apply 1 application topically as needed for dry skin. Apply to face after washing face daily    [provider]  LUMIGAN 0.01 % SOLN Place 1 drop into both eyes at bedtime. 07/06/21   [provider]  melatonin 5 MG TABS Take 5 mg by mouth at bedtime.    [provider]  Menthol, Topical Analgesic, (BIOFREEZE) 4 % GEL Apply 1 application topically 3 (three) times daily. To lower back area as needed for pain    [provider]  metFORMIN (GLUCOPHAGE) 500 MG tablet Take 500 mg by mouth 2 (two) times daily with a meal.    [provider]  omeprazole (PRILOSEC) 20 MG capsule Take 20 mg by mouth 2 (two) times daily before a meal. May open and sprinkle in applesauce.    [provider]  [name] (OYSTER CALCIUM) 500 MG TABS tablet Take 500 mg of elemental calcium by mouth 2 (two)  times daily.    [provider]  risperidone (RISPERDAL) 4 MG tablet Take 4 mg by mouth at bedtime. 05/18/15   [provider]  vitamin C (ASCORBIC ACID) 250 MG tablet Take 1 tablet (250 mg total) by mouth daily. 03/05/21   Drema Dallas, MD    Allergies Sulfa antibiotics and Xarelto [rivaroxaban]  Family History  Problem Relation Age of Onset   Diabetes Other    Bladder Cancer Neg Hx    Kidney cancer Neg Hx    Prostate cancer Neg Hx     Social History Social History   Tobacco Use   Smoking status: Former   Smokeless tobacco: Never  Substance Use Topics   Alcohol use: No   Drug use: No    Review of Systems  Constitutional: No fever/chills Eyes: No visual  changes. ENT: No sore throat. Cardiovascular: Denies chest pain.  Positive for displaced PICC line. Respiratory: Denies shortness of breath. Gastrointestinal: No abdominal pain.  No nausea, no vomiting.  No diarrhea.  No constipation. Genitourinary: Negative for dysuria. Musculoskeletal: Negative for back pain. Skin: Negative for rash. Neurological: Negative for headaches, focal weakness or numbness.  ____________________________________________   PHYSICAL EXAM:  VITAL SIGNS: ED Triage Vitals [10/18/21 2342]  Enc Vitals Group     BP 113/84     Pulse Rate 68     Resp 18     Temp 97.9 F (36.6 C)     Temp Source Oral     SpO2 98 %     Weight      Height      Head Circumference      [facility]      Pain Score 0     Pain Loc      Pain Edu?      Excl. in [place]?     Constitutional: Alert and oriented. Eyes: Conjunctivae are normal. Head: Atraumatic. Nose: No congestion/rhinnorhea. Mouth/Throat: Mucous membranes are moist. Neck: Normal ROM Cardiovascular: Normal rate, regular rhythm. Grossly normal heart sounds.  Left upper extremity PICC line site clean, dry, and intact with no bleeding. Respiratory: Normal respiratory effort.  No retractions. Lungs CTAB. Gastrointestinal: Soft and  nontender. No distention. Genitourinary: deferred Musculoskeletal: No lower extremity tenderness nor edema. Neurologic:  Normal speech and language. No gross focal neurologic deficits are appreciated. Skin:  Skin is warm, dry and intact. No rash noted. Psychiatric: Mood and affect are normal. Speech and behavior are normal.  ____________________________________________   LABS (all labs ordered are listed, but only abnormal results are displayed)  Labs Reviewed - No data to display   PROCEDURES  Procedure(s) performed (including Critical Care):  Procedures   ____________________________________________   INITIAL IMPRESSION / ASSESSMENT AND PLAN / ED COURSE      65 year old male with past medical history of hypertension, diabetes, atrial fibrillation on Eliquis, CAD, intellectual disability, and schizophrenia who presents to the ED due to concern for displaced PICC line.  Patient arrives via EMS with PICC line completely removed.  The line itself appears intact and I have no concern for retained fragment of the line.  Access site itself shows no signs of infection or other concerning findings.  Reviewing patient's chart, he was due to have PICC line removed yesterday as he had completed course of ceftriaxone.  He has no complaints at this time, vital signs are reassuring, and no reason to suspect recurrent bacteremia.  He is appropriate for discharge back to nursing facility with infectious disease follow-up.      ____________________________________________   FINAL CLINICAL IMPRESSION(S) / ED DIAGNOSES  Final diagnoses:  PICC (peripherally inserted central catheter) removal     ED Discharge Orders     None        Note:  This document was prepared using Dragon voice recognition software and may include unintentional dictation errors.    Blake Divine, MD 10/19/21 [phone]

## 2021-10-19 NOTE — ED Notes (Signed)
This RN made several attempts to contact the patients legal guardian & on call DSS worker for this patients behalf without success. Facility notified of the patients return.

## 2021-11-02 ENCOUNTER — Inpatient Hospital Stay: Payer: Medicare Other | Admitting: Infectious Diseases

## 2022-01-01 ENCOUNTER — Encounter: Payer: Self-pay | Admitting: *Deleted

## 2022-01-02 ENCOUNTER — Ambulatory Visit: Payer: Medicare Other

## 2022-01-02 ENCOUNTER — Encounter
Admission: RE | Disposition: A | Discharge status: Home / Self Care | Payer: Self-pay | Source: Home / Self Care | Attending: Gastroenterology

## 2022-01-02 ENCOUNTER — Ambulatory Visit
Admission: RE | Admit: 2022-01-02 | Discharge: 2022-01-02 | Disposition: A | Discharge status: Home / Self Care | Payer: Medicare Other | Attending: Gastroenterology | Admitting: Gastroenterology

## 2022-01-02 ENCOUNTER — Other Ambulatory Visit: Payer: Self-pay

## 2022-01-02 DIAGNOSIS — Z955 Presence of coronary angioplasty implant and graft: Secondary | ICD-10-CM | POA: Diagnosis not present

## 2022-01-02 DIAGNOSIS — Z7984 Long term (current) use of oral hypoglycemic drugs: Secondary | ICD-10-CM | POA: Diagnosis not present

## 2022-01-02 DIAGNOSIS — K219 Gastro-esophageal reflux disease without esophagitis: Secondary | ICD-10-CM | POA: Diagnosis not present

## 2022-01-02 DIAGNOSIS — Z7901 Long term (current) use of anticoagulants: Secondary | ICD-10-CM | POA: Diagnosis not present

## 2022-01-02 DIAGNOSIS — Z87891 Personal history of nicotine dependence: Secondary | ICD-10-CM | POA: Diagnosis not present

## 2022-01-02 DIAGNOSIS — I2510 Atherosclerotic heart disease of native coronary artery without angina pectoris: Secondary | ICD-10-CM | POA: Diagnosis not present

## 2022-01-02 DIAGNOSIS — Z79899 Other long term (current) drug therapy: Secondary | ICD-10-CM | POA: Insufficient documentation

## 2022-01-02 DIAGNOSIS — I10 Essential (primary) hypertension: Secondary | ICD-10-CM | POA: Insufficient documentation

## 2022-01-02 DIAGNOSIS — E785 Hyperlipidemia, unspecified: Secondary | ICD-10-CM | POA: Insufficient documentation

## 2022-01-02 DIAGNOSIS — E119 Type 2 diabetes mellitus without complications: Secondary | ICD-10-CM | POA: Insufficient documentation

## 2022-01-02 DIAGNOSIS — J449 Chronic obstructive pulmonary disease, unspecified: Secondary | ICD-10-CM | POA: Diagnosis not present

## 2022-01-02 DIAGNOSIS — Z86010 Personal history of colonic polyps: Secondary | ICD-10-CM | POA: Insufficient documentation

## 2022-01-02 DIAGNOSIS — I4891 Unspecified atrial fibrillation: Secondary | ICD-10-CM | POA: Diagnosis not present

## 2022-01-02 HISTORY — PX: COLONOSCOPY WITH PROPOFOL: SHX5780

## 2022-01-02 LAB — GLUCOSE, CAPILLARY: Glucose-Capillary: 67 mg/dL — ABNORMAL LOW (ref 70–99)

## 2022-01-02 SURGERY — COLONOSCOPY WITH PROPOFOL
Anesthesia: General

## 2022-01-02 MED ORDER — DEXTROSE 50 % IV SOLN: 12.5000 g | Freq: Once | INTRAVENOUS | Status: AC

## 2022-01-02 MED ORDER — PROPOFOL 500 MG/50ML IV EMUL
INTRAVENOUS | Status: DC | PRN
  Administered 2022-01-02: 150 ug/kg/min via INTRAVENOUS

## 2022-01-02 MED ORDER — PROPOFOL 10 MG/ML IV BOLUS
INTRAVENOUS | Status: DC | PRN
  Administered 2022-01-02: 20 mg via INTRAVENOUS
  Administered 2022-01-02: 10 mg via INTRAVENOUS
  Administered 2022-01-02: 50 mg via INTRAVENOUS

## 2022-01-02 MED ORDER — LIDOCAINE HCL (PF) 2 % IJ SOLN
INTRAMUSCULAR | Status: DC | PRN
  Administered 2022-01-02: 50 mg via INTRADERMAL

## 2022-01-02 MED ORDER — DEXTROSE 50 % IV SOLN
INTRAVENOUS | Status: AC
  Administered 2022-01-02: 12.5 g
  Filled 2022-01-02: qty 50

## 2022-01-02 MED ORDER — SODIUM CHLORIDE 0.9 % IV SOLN: INTRAVENOUS | Status: DC

## 2022-01-02 NOTE — Anesthesia Postprocedure Evaluation (Signed)
Anesthesia Post Note  Patient: Brian Dodson  Procedure(s) Performed: COLONOSCOPY WITH PROPOFOL  Patient location during evaluation: Endoscopy Anesthesia Type: General Level of consciousness: awake and alert Pain management: pain level controlled Vital Signs Assessment: post-procedure vital signs reviewed and stable Respiratory status: spontaneous breathing, nonlabored ventilation, respiratory function stable and patient connected to nasal cannula oxygen Cardiovascular status: blood pressure returned to baseline and stable Postop Assessment: no apparent nausea or vomiting Anesthetic complications: no   No notable events documented.   Last Vitals:  Vitals:   01/02/22 1443 01/02/22 1453  BP: (!) 123/92 105/81  Pulse: (!) 112 97  Resp: 17 14  Temp: (!) 36.1 C   SpO2: 97% 99%    Last Pain:  Vitals:   01/02/22 1453  TempSrc:   PainSc: 0-No pain                 Corinda Gubler

## 2022-01-02 NOTE — H&P (Signed)
Outpatient short stay form Pre-procedure 01/02/2022  Lesly Rubenstein, MD  Primary Physician: [name], MD  Reason for visit:  History of polyps with suboptimal prep  History of present illness:    66 y/o gentleman with history of HLD, hypertension, and schizophrenia here for colonoscopy. Last colonoscopy < 6 months ago but had poor prep. One large polyp was noted. Last dose of NOAC was two days ago. Patient without any significant abdominal surgeries. No known family history of GI malignancies.    Current Facility-Administered Medications:    0.9 %  sodium chloride infusion, , Intravenous, Continuous, Claude Waldman, Hilton Cork, MD   dextrose 50 % solution, , , ,   Medications Prior to Admission  Medication Sig Dispense Refill Last Dose   losartan (COZAAR) 25 MG tablet Take 25 mg by mouth daily.      acetaminophen (TYLENOL) 500 MG tablet Take 1,000 mg by mouth every 6 (six) hours as needed for mild pain.      amiodarone (PACERONE) 200 MG tablet Take 1 tablet (200 mg total) by mouth daily.      aspirin 81 MG tablet Take 81 mg by mouth daily.       atorvastatin (LIPITOR) 10 MG tablet Take 10 mg by mouth daily.      benztropine (COGENTIN) 1 MG tablet Take 1 mg by mouth at bedtime.       brimonidine-timolol (COMBIGAN) 0.2-0.5 % ophthalmic solution Place 1 drop into both eyes every 12 (twelve) hours. Wait 3 to 5 minutes between drops      Cholecalciferol (EQL VITAMIN D3) 50 MCG (2000 UT) CAPS Take 1 capsule by mouth daily.      citalopram (CELEXA) 20 MG tablet Take 20 mg by mouth daily.      diclofenac Sodium (VOLTAREN) 1 % GEL Apply 2 g topically 4 (four) times daily as needed. Apply to neck and shoulders      divalproex (DEPAKOTE ER) 500 MG 24 hr tablet Take 2,000 mg by mouth at bedtime.       donepezil (ARICEPT) 10 MG tablet Take 10 mg by mouth at bedtime.       ELIQUIS 5 MG TABS tablet Take 5 mg by mouth 2 (two) times daily.   12/30/2021   ferrous sulfate 325 (65 FE) MG tablet Take  325 mg by mouth daily with breakfast.      furosemide (LASIX) 20 MG tablet Take 20 mg by mouth daily as needed. For edema (swelling)      lanolin/mineral oil (KERI/THERA-DERM) LOTN Apply 1 application topically as needed for dry skin. Apply to face after washing face daily      LUMIGAN 0.01 % SOLN Place 1 drop into both eyes at bedtime.      melatonin 5 MG TABS Take 5 mg by mouth at bedtime.      Menthol, Topical Analgesic, (BIOFREEZE) 4 % GEL Apply 1 application topically 3 (three) times daily. To lower back area as needed for pain      metFORMIN (GLUCOPHAGE) 500 MG tablet Take 500 mg by mouth 2 (two) times daily with a meal.      omeprazole (PRILOSEC) 20 MG capsule Take 20 mg by mouth 2 (two) times daily before a meal. May open and sprinkle in applesauce.      Oyster Shell (OYSTER CALCIUM) 500 MG TABS tablet Take 500 mg of elemental calcium by mouth 2 (two) times daily.      risperidone (RISPERDAL) 4 MG tablet Take 4 mg  by mouth at bedtime.      vitamin C (ASCORBIC ACID) 250 MG tablet Take 1 tablet (250 mg total) by mouth daily. 30 tablet 0      Allergies  Allergen Reactions   Sulfa Antibiotics Other (See Comments)    Unknown reaction   Xarelto [Rivaroxaban]      Past Medical History:  Diagnosis Date   Asthma    Atrial fibrillation ([place])    Benign prostatic hypertrophy with lower urinary tract symptoms (LUTS)    Bulbous urethral stricture    CAD (coronary artery disease)    s/p coronary stent 2003   Constipation    COPD (chronic obstructive pulmonary disease) (HCC)    Diabetes mellitus without complication (HCC)    Dizziness    Dyspnea    GERD (gastroesophageal reflux disease)    Gross hematuria    Hyperlipemia    Hypertension    Lumbago    Palpitation    Schizophrenia ([place])     Review of systems:  Otherwise negative.    Physical Exam  Gen: Alert, oriented. Appears stated age.  HEENT: PERRLA. Lungs: No respiratory distress CV: RRR Abd: soft, benign, no  masses Ext: No edema    Planned procedures: Proceed with colonoscopy. The patient understands the nature of the planned procedure, indications, risks, alternatives and potential complications including but not limited to bleeding, infection, perforation, damage to internal organs and possible oversedation/side effects from anesthesia. The patient agrees and gives consent to proceed.  Please refer to procedure notes for findings, recommendations and patient disposition/instructions.     Lesly Rubenstein, MD [facility]

## 2022-01-02 NOTE — Op Note (Signed)
[facility]astroenter[facility] Patient Name: Brian Dodson Procedure Date: 01/02/2022 1:24 PM MRN: [id number] Account #: [id number] Date of Birth: 24-Apr-1956 Admit Type: Outpatient Age: 66 Room: [facility] ENDO ROOM 1 Gender: Male Note Status: Finalized Instrument Name: Jasper Riling [id number] Procedure:             Colonoscopy Indications:           Surveillance: Personal history of adenomatous polyps,                         inadequate prep on last colonoscopy (less than 1 year                         ago) Providers:             Andrey Farmer MD, MD Referring MD:          No Local Md, MD (Referring MD) Medicines:             Monitored Anesthesia Care Complications:         No immediate complications. Procedure:             Pre-Anesthesia Assessment:                        - Prior to the procedure, a History and Physical was                         performed, and patient medications and allergies were                         reviewed. The patient is competent. The risks and                         benefits of the procedure and the sedation options and                         risks were discussed with the patient. All questions                         were answered and informed consent was obtained.                         Patient identification and proposed procedure were                         verified by the physician, the nurse, the                         anesthesiologist, the anesthetist and the technician                         in the endoscopy suite. Mental Status Examination:                         alert and oriented. Airway Examination: normal                         oropharyngeal airway and neck mobility. Respiratory  Examination: clear to auscultation. CV Examination:                         normal. Prophylactic Antibiotics: The patient does not                         require prophylactic antibiotics. Prior                          Anticoagulants: The patient has taken Eliquis                         (apixaban), last dose was 2 days prior to procedure.                         ASA Grade Assessment: III - A patient with severe                         systemic disease. After reviewing the risks and                         benefits, the patient was deemed in satisfactory                         condition to undergo the procedure. The anesthesia                         plan was to use monitored anesthesia care (MAC).                         Immediately prior to administration of medications,                         the patient was re-assessed for adequacy to receive                         sedatives. The heart rate, respiratory rate, oxygen                         saturations, blood pressure, adequacy of pulmonary                         ventilation, and response to care were monitored                         throughout the procedure. The physical status of the                         patient was re-assessed after the procedure.                        After obtaining informed consent, the colonoscope was                         passed under direct vision. Throughout the procedure,                         the patient's blood pressure, pulse, and oxygen  saturations were monitored continuously. The                         Colonoscope was introduced through the anus with the                         intention of advancing to the cecum. The scope was                         advanced to the ascending colon before the procedure                         was aborted. Medications were given. The colonoscopy                         was aborted due to poor bowel prep. The patient                         tolerated the procedure well. The quality of the bowel                         preparation was poor. Findings:      The perianal and digital rectal examinations were normal.      A large amount of semi-liquid  stool was found in the entire colon,       precluding visualization. Impression:            - The procedure was aborted due to poor bowel prep.                        - Preparation of the colon was poor.                        - Stool in the entire examined colon.                        - No specimens collected. Recommendation:        - Discharge patient to home.                        - Resume previous diet.                        - Resume Eliquis (apixaban) at prior dose today.                        - Repeat colonoscopy because the bowel preparation was                         suboptimal. Will need to discuss risks vs benefits                         given patient's condition.                        - Return to referring physician as previously                         scheduled. Procedure Code(s):     --- Professional ---  G0105, 53, Colorectal cancer screening; colonoscopy on                         individual at high risk Diagnosis Code(s):     --- Professional ---                        Z86.010, Personal history of colonic polyps CPT copyright 2019 [employer]. All rights reserved. The codes documented in this report are preliminary and upon coder review may  be revised to meet current compliance requirements. Andrey Farmer MD, MD 01/02/2022 2:45:05 PM Number of Addenda: 0 Note Initiated On: 01/02/2022 1:24 PM Total Procedure Duration: 0 hours 8 minutes 52 seconds  Estimated Blood Loss:  Estimated blood loss: none.      [facility]

## 2022-01-02 NOTE — Transfer of Care (Signed)
Immediate Anesthesia Transfer of Care Note  Patient: Brian Dodson  Procedure(s) Performed: COLONOSCOPY WITH PROPOFOL  Patient Location: PACU  Anesthesia Type:General  Level of Consciousness: drowsy  Airway & Oxygen Therapy: Patient Spontanous Breathing and Patient connected to nasal cannula oxygen  Post-op Assessment: Report given to RN and Post -op Vital signs reviewed and stable  Post vital signs: Reviewed and stable  Last Vitals:  Vitals Value Taken Time  BP 123/92 01/02/22 1443  Temp 36.1 C 01/02/22 1443  Pulse 112 01/02/22 1443  Resp 17 01/02/22 1443  SpO2 97 % 01/02/22 1443    Last Pain:  Vitals:   01/02/22 1443  TempSrc: Temporal  PainSc: Asleep         Complications: No notable events documented.

## 2022-01-02 NOTE — Anesthesia Preprocedure Evaluation (Addendum)
Anesthesia Evaluation  Patient identified by MRN, date of birth, ID band Patient awake and Patient confused    Reviewed: Allergy & Precautions, H&P , NPO status , Patient's Chart, lab work & pertinent test results  History of Anesthesia Complications Negative for: history of anesthetic complications  Airway Mallampati: II  TM Distance: >3 FB Neck ROM: full    Dental  (+) Poor Dentition, Chipped Multiple broken and missing teeth:   Pulmonary shortness of breath, asthma , neg sleep apnea, COPD, Patient abstained from smoking.Not current smoker, former smoker,    Pulmonary exam normal        Cardiovascular Exercise Tolerance: Good METShypertension, (-) angina+ CAD and + Cardiac Stents (s/p coronary stent 2005)  (-) Past MI Normal cardiovascular exam+ dysrhythmias (AFib on Eliquis) Atrial Fibrillation   ECHO 11/22: Reviewed. Normal   Neuro/Psych PSYCHIATRIC DISORDERS Schizophrenia Dementia negative neurological ROS     GI/Hepatic Neg liver ROS, GERD  ,  Endo/Other  diabetes, Well Controlled, Type 2, Oral Hypoglycemic Agents  Renal/GU Renal disease (AKI)  negative genitourinary   Musculoskeletal   Abdominal   Peds  Hematology  (+) Blood dyscrasia (Hgb 9.1 g/dL), anemia ,   Anesthesia Other Findings Past Medical History: No date: Asthma No date: Atrial fibrillation (HCC) No date: Benign prostatic hypertrophy with lower urinary tract  symptoms (LUTS) No date: Bulbous urethral stricture No date: CAD (coronary artery disease)     Comment:  s/p coronary stent 2003 No date: Constipation No date: COPD (chronic obstructive pulmonary disease) (HCC) No date: Diabetes mellitus without complication (HCC) No date: Dizziness No date: Dyspnea No date: GERD (gastroesophageal reflux disease) No date: Gross hematuria No date: Hyperlipemia No date: Hypertension No date: Lumbago No date: Palpitation No date: Schizophrenia ([place])   Reproductive/Obstetrics negative OB ROS                           Anesthesia Physical  Anesthesia Plan  ASA: 3  Anesthesia Plan: General   Post-op Pain Management: Minimal or no pain anticipated   Induction: Intravenous  PONV Risk Score and Plan: 2 and Propofol infusion and TIVA  Airway Management Planned: Nasal Cannula and Natural Airway  Additional Equipment: None  Intra-op Plan:   Post-operative Plan:   Informed Consent: I have reviewed the patients History and Physical, chart, labs and discussed the procedure including the risks, benefits and alternatives for the proposed anesthesia with the patient or authorized representative who has indicated his/her understanding and acceptance.     Dental Advisory Given and Consent reviewed with POA  Plan Discussed with: Anesthesiologist, CRNA and Surgeon  Anesthesia Plan Comments: (Discussed risks of anesthesia with patient's legal guardian Barrett Henle, from [place]) via telephone in presence of nurses, including possibility of difficulty with spontaneous ventilation under anesthesia necessitating airway intervention, PONV, and rare risks such as cardiac or respiratory or neurological events, and allergic reactions. Discussed the role of CRNA in patient's perioperative care. Guardian understands.)      Anesthesia Quick Evaluation

## 2022-01-02 NOTE — Interval H&P Note (Signed)
History and Physical Interval Note:  01/02/2022 2:25 PM  Brian Dodson  has presented today for surgery, with the diagnosis of Screening Colon.  The various methods of treatment have been discussed with the patient and family. After consideration of risks, benefits and other options for treatment, the patient has consented to  Procedure(s): COLONOSCOPY WITH PROPOFOL (N/A) as a surgical intervention.  The patient's history has been reviewed, patient examined, no change in status, stable for surgery.  I have reviewed the patient's chart and labs.  Questions were answered to the patient's satisfaction.     Regis Bill  Ok to proceed with colonoscopy

## 2022-01-03 ENCOUNTER — Encounter: Payer: Self-pay | Admitting: Gastroenterology

## 2022-01-10 ENCOUNTER — Encounter: Payer: Self-pay | Admitting: Ophthalmology

## 2022-01-11 NOTE — Discharge Instructions (Signed)

## 2022-01-16 ENCOUNTER — Ambulatory Visit: Payer: Medicare Other | Admitting: Anesthesiology

## 2022-01-16 ENCOUNTER — Encounter
Admission: RE | Disposition: A | Discharge status: Home / Self Care | Payer: Self-pay | Source: Home / Self Care | Attending: Ophthalmology

## 2022-01-16 ENCOUNTER — Encounter: Payer: Self-pay | Admitting: Ophthalmology

## 2022-01-16 ENCOUNTER — Other Ambulatory Visit: Payer: Self-pay

## 2022-01-16 ENCOUNTER — Ambulatory Visit
Admission: RE | Admit: 2022-01-16 | Discharge: 2022-01-16 | Disposition: A | Discharge status: Home / Self Care | Payer: Medicare Other | Attending: Ophthalmology | Admitting: Ophthalmology

## 2022-01-16 DIAGNOSIS — E1136 Type 2 diabetes mellitus with diabetic cataract: Secondary | ICD-10-CM | POA: Diagnosis not present

## 2022-01-16 DIAGNOSIS — H2511 Age-related nuclear cataract, right eye: Secondary | ICD-10-CM | POA: Diagnosis not present

## 2022-01-16 DIAGNOSIS — F0390 Unspecified dementia without behavioral disturbance: Secondary | ICD-10-CM | POA: Diagnosis not present

## 2022-01-16 DIAGNOSIS — Z87891 Personal history of nicotine dependence: Secondary | ICD-10-CM | POA: Insufficient documentation

## 2022-01-16 DIAGNOSIS — N189 Chronic kidney disease, unspecified: Secondary | ICD-10-CM | POA: Insufficient documentation

## 2022-01-16 DIAGNOSIS — D631 Anemia in chronic kidney disease: Secondary | ICD-10-CM | POA: Diagnosis not present

## 2022-01-16 DIAGNOSIS — I129 Hypertensive chronic kidney disease with stage 1 through stage 4 chronic kidney disease, or unspecified chronic kidney disease: Secondary | ICD-10-CM | POA: Insufficient documentation

## 2022-01-16 DIAGNOSIS — K219 Gastro-esophageal reflux disease without esophagitis: Secondary | ICD-10-CM | POA: Diagnosis not present

## 2022-01-16 DIAGNOSIS — I4891 Unspecified atrial fibrillation: Secondary | ICD-10-CM | POA: Insufficient documentation

## 2022-01-16 HISTORY — DX: Chronic kidney disease, unspecified: N18.9

## 2022-01-16 HISTORY — DX: Anemia, unspecified: D64.9

## 2022-01-16 HISTORY — DX: Vascular dementia, unspecified severity, without behavioral disturbance, psychotic disturbance, mood disturbance, and anxiety: F01.50

## 2022-01-16 HISTORY — PX: CATARACT EXTRACTION W/PHACO: SHX586

## 2022-01-16 LAB — GLUCOSE, CAPILLARY
Glucose-Capillary: 95 mg/dL (ref 70–99)
Glucose-Capillary: 92 mg/dL (ref 70–99)

## 2022-01-16 SURGERY — PHACOEMULSIFICATION, CATARACT, WITH IOL INSERTION
Anesthesia: Monitor Anesthesia Care | Site: Eye | Laterality: Right

## 2022-01-16 MED ORDER — LACTATED RINGERS IV SOLN: INTRAVENOUS | Status: DC

## 2022-01-16 MED ORDER — MOXIFLOXACIN HCL 0.5 % OP SOLN
OPHTHALMIC | Status: DC | PRN
Start: 2022-01-16 — End: 2022-01-16
  Administered 2022-01-16: 0.2 mL via OPHTHALMIC

## 2022-01-16 MED ORDER — SIGHTPATH DOSE#1 BSS IO SOLN
INTRAOCULAR | Status: DC | PRN
  Administered 2022-01-16: 1 mL via INTRAMUSCULAR

## 2022-01-16 MED ORDER — SIGHTPATH DOSE#1 BSS IO SOLN
INTRAOCULAR | Status: DC | PRN
  Administered 2022-01-16: 15 mL

## 2022-01-16 MED ORDER — BRIMONIDINE TARTRATE-TIMOLOL 0.2-0.5 % OP SOLN
OPHTHALMIC | Status: DC | PRN
  Administered 2022-01-16: 1 [drp] via OPHTHALMIC

## 2022-01-16 MED ORDER — SIGHTPATH DOSE#1 BSS IO SOLN
INTRAOCULAR | Status: DC | PRN
  Administered 2022-01-16: 75 mL via OPHTHALMIC

## 2022-01-16 MED ORDER — SIGHTPATH DOSE#1 NA CHONDROIT SULF-NA HYALURON 40-17 MG/ML IO SOLN
INTRAOCULAR | Status: DC | PRN
  Administered 2022-01-16: 1 mL via INTRAOCULAR

## 2022-01-16 MED ORDER — FENTANYL CITRATE (PF) 100 MCG/2ML IJ SOLN
INTRAMUSCULAR | Status: DC | PRN
  Administered 2022-01-16 (×2): 50 ug via INTRAVENOUS

## 2022-01-16 MED ORDER — ARMC OPHTHALMIC DILATING DROPS
1.0000 "application " | OPHTHALMIC | Status: DC | PRN
  Administered 2022-01-16 (×3): 1 via OPHTHALMIC

## 2022-01-16 MED ORDER — TETRACAINE HCL 0.5 % OP SOLN
1.0000 [drp] | OPHTHALMIC | Status: DC | PRN
  Administered 2022-01-16 (×3): 1 [drp] via OPHTHALMIC

## 2022-01-16 SURGICAL SUPPLY — 10 items
CATARACT SUITE SIGHTPATH (MISCELLANEOUS) ×2 IMPLANT
FEE CATARACT SUITE SIGHTPATH (MISCELLANEOUS) ×1 IMPLANT
GLOVE SURG ENC TEXT LTX SZ8 (GLOVE) ×2 IMPLANT
GLOVE SURG TRIUMPH 8.0 PF LTX (GLOVE) ×2 IMPLANT
LENS IOL TECNIS EYHANCE 22.0 (Intraocular Lens) ×1 IMPLANT
NDL FILTER BLUNT 18X1 1/2 (NEEDLE) ×1 IMPLANT
NEEDLE FILTER BLUNT 18X 1/2SAF (NEEDLE) ×1
NEEDLE FILTER BLUNT 18X1 1/2 (NEEDLE) ×1 IMPLANT
SYR 3ML LL SCALE MARK (SYRINGE) ×2 IMPLANT
WATER STERILE IRR 250ML POUR (IV SOLUTION) ×2 IMPLANT

## 2022-01-16 NOTE — Anesthesia Preprocedure Evaluation (Signed)
Anesthesia Evaluation  Patient identified by MRN, date of birth, ID band Patient awake    Reviewed: Allergy & Precautions, H&P , NPO status , Patient's Chart, lab work & pertinent test results, reviewed documented beta blocker date and time   Airway Mallampati: II  TM Distance: >3 FB Neck ROM: full    Dental  (+) Edentulous Upper, Edentulous Lower   Pulmonary shortness of breath, asthma , COPD, former smoker,    Pulmonary exam normal breath sounds clear to auscultation       Cardiovascular Exercise Tolerance: Good hypertension, Normal cardiovascular exam+ dysrhythmias Atrial Fibrillation  Rhythm:regular Rate:Normal     Neuro/Psych PSYCHIATRIC DISORDERS Dementia negative neurological ROS     GI/Hepatic Neg liver ROS, GERD  ,  Endo/Other  diabetes, Type 2  Renal/GU negative Renal ROS  negative genitourinary   Musculoskeletal   Abdominal   Peds  Hematology  (+) Blood dyscrasia, anemia ,   Anesthesia Other Findings   Reproductive/Obstetrics negative OB ROS                             Anesthesia Physical Anesthesia Plan  ASA: 3  Anesthesia Plan: MAC   Post-op Pain Management:    Induction:   PONV Risk Score and Plan:   Airway Management Planned:   Additional Equipment:   Intra-op Plan:   Post-operative Plan:   Informed Consent: I have reviewed the patients History and Physical, chart, labs and discussed the procedure including the risks, benefits and alternatives for the proposed anesthesia with the patient or authorized representative who has indicated his/her understanding and acceptance.     Dental Advisory Given  Plan Discussed with: CRNA and Anesthesiologist  Anesthesia Plan Comments:         Anesthesia Quick Evaluation

## 2022-01-16 NOTE — H&P (Signed)
[facility]   Primary Care Physician:  [name], MD Ophthalmologist: Dr. George Ina  Pre-Procedure History & Physical: HPI:  Brian Dodson is a 66 y.o. male here for cataract surgery.   Past Medical History:  Diagnosis Date   Anemia    Asthma    Atrial fibrillation ([place])    Benign prostatic hypertrophy with lower urinary tract symptoms (LUTS)    Bulbous urethral stricture    CAD (coronary artery disease)    s/p coronary stent 2003   CKD (chronic kidney disease)    Constipation    COPD (chronic obstructive pulmonary disease) (HCC)    Diabetes mellitus without complication (HCC)    type 2   Dizziness    Dyspnea    GERD (gastroesophageal reflux disease)    Gross hematuria    Hyperlipemia    Hypertension    Lumbago    Palpitation    Schizophrenia ([place])    Vascular dementia ([place])     Past Surgical History:  Procedure Laterality Date   COLONOSCOPY     COLONOSCOPY WITH PROPOFOL N/A 09/22/2021   Procedure: COLONOSCOPY WITH PROPOFOL;  Surgeon: Lesly Rubenstein, MD;  Location: [facility] ENDOSCOPY;  Service: Endoscopy;  Laterality: N/A;   COLONOSCOPY WITH PROPOFOL N/A 01/02/2022   Procedure: COLONOSCOPY WITH PROPOFOL;  Surgeon: Lesly Rubenstein, MD;  Location: [facility] ENDOSCOPY;  Service: Endoscopy;  Laterality: N/A;   CORONARY ANGIOPLASTY WITH STENT PLACEMENT  11/2003   kidney stent     TEE WITHOUT CARDIOVERSION N/A 09/20/2021   Procedure: TRANSESOPHAGEAL ECHOCARDIOGRAM (TEE);  Surgeon: Teodoro Spray, MD;  Location: [facility] ORS;  Service: Cardiovascular;  Laterality: N/A;    Prior to Admission medications   Medication Sig Start Date End Date Taking? Authorizing Provider  acetaminophen (TYLENOL) 500 MG tablet Take 1,000 mg by mouth every 6 (six) hours as needed for mild pain.   Yes [provider]  amiodarone (PACERONE) 200 MG tablet Take 1 tablet (200 mg total) by mouth daily. 07/26/21  Yes Wyvonnia Dusky, MD  aspirin 81 MG tablet Take 81 mg by mouth daily.     Yes [provider]  atorvastatin (LIPITOR) 10 MG tablet Take 10 mg by mouth daily. 05/18/15  Yes [provider]  benztropine (COGENTIN) 1 MG tablet Take 1 mg by mouth at bedtime.    Yes [provider]  brimonidine-timolol (COMBIGAN) 0.2-0.5 % ophthalmic solution Place 1 drop into both eyes every 12 (twelve) hours. Wait 3 to 5 minutes between drops   Yes [provider]  carvedilol (COREG) 12.5 MG tablet Take 12.5 mg by mouth in the morning and at bedtime.   Yes [provider]  Cholecalciferol (EQL VITAMIN D3) 50 MCG (2000 UT) CAPS Take 1 capsule by mouth daily.   Yes [provider]  citalopram (CELEXA) 20 MG tablet Take 20 mg by mouth daily.   Yes [provider]  diclofenac Sodium (VOLTAREN) 1 % GEL Apply 2 g topically 4 (four) times daily as needed. Apply to neck and shoulders 08/18/21  Yes Jennye Boroughs, MD  divalproex (DEPAKOTE ER) 500 MG 24 hr tablet Take 2,000 mg by mouth at bedtime.    Yes [provider]  donepezil (ARICEPT) 10 MG tablet Take 10 mg by mouth at bedtime.    Yes [provider]  ELIQUIS 5 MG TABS tablet Take 5 mg by mouth 2 (two) times daily. 05/18/15  Yes [provider]  ferrous sulfate 325 (65 FE) MG tablet  Take 325 mg by mouth daily with breakfast.   Yes [provider]  furosemide (LASIX) 20 MG tablet Take 20 mg by mouth daily as needed. For edema (swelling) 06/22/21  Yes [provider]  lanolin/mineral oil (KERI/THERA-DERM) LOTN Apply 1 application topically as needed for dry skin. Apply to face after washing face daily   Yes [provider]  Loratadine 10 MG CAPS Take by mouth daily.   Yes [provider]  LUMIGAN 0.01 % SOLN Place 1 drop into both eyes at bedtime. 07/06/21  Yes [provider]  magnesium oxide (MAG-OX) 400 MG tablet Take 400 mg by mouth daily.   Yes [provider]  melatonin 5 MG TABS Take 5 mg by mouth at  bedtime.   Yes [provider]  Menthol, Topical Analgesic, (BIOFREEZE) 4 % GEL Apply 1 application topically 3 (three) times daily. To lower back area as needed for pain   Yes [provider]  metFORMIN (GLUCOPHAGE) 500 MG tablet Take 500 mg by mouth 2 (two) times daily with a meal.   Yes [provider]  omeprazole (PRILOSEC) 20 MG capsule Take 20 mg by mouth 2 (two) times daily before a meal. May open and sprinkle in applesauce.   Yes [provider]  [place] (OYSTER CALCIUM) 500 MG TABS tablet Take 500 mg of elemental calcium by mouth 2 (two) times daily.   Yes [provider]  risperidone (RISPERDAL) 4 MG tablet Take 4 mg by mouth at bedtime. 05/18/15  Yes [provider]  sodium chloride (OCEAN) 0.65 % SOLN nasal spray Place 1 spray into both nostrils daily.   Yes [provider]    Allergies as of 12/12/2021 - Review Complete 10/18/2021  Allergen Reaction Noted   Sulfa antibiotics Other (See Comments) 08/15/2021   Xarelto [rivaroxaban]  07/22/2021    Family History  Problem Relation Age of Onset   Diabetes Other    Bladder Cancer Neg Hx    Kidney cancer Neg Hx    Prostate cancer Neg Hx     Social History   Socioeconomic History   Marital status: Single    Spouse name: Not on file   Number of children: Not on file   Years of education: Not on file   Highest education level: Not on file  Occupational History   Not on file  Tobacco Use   Smoking status: Former   Smokeless tobacco: Never  Vaping Use   Vaping Use: Never used  Substance and Sexual Activity   Alcohol use: No   Drug use: No   Sexual activity: Not on file  Other Topics Concern   Not on file  Social History Narrative   Not on file   Social Determinants of Health   Financial Resource Strain: Not on file  Food Insecurity: Not on file  Transportation Needs: Not on file  Physical Activity: Not on file  Stress: Not on file  Social  Connections: Not on file  Intimate Partner Violence: Not on file    Review of Systems: See HPI, otherwise negative ROS  Physical Exam: BP 117/78    Pulse (!) 58    Temp 98 F (36.7 C) (Temporal)    Resp 19    Ht 6' (1.829 m)    Wt 97.5 kg    SpO2 99%    BMI 29.16 kg/m  General:   Alert, cooperative in NAD Head:  Normocephalic and atraumatic. Respiratory:  Normal work of breathing.  Cardiovascular:  RRR  Impression/Plan: Lowanda Foster is here for cataract surgery.  Risks, benefits, limitations, and alternatives regarding cataract surgery have been reviewed with the patient.  Questions have been answered.  All parties agreeable.   Birder Robson, MD  01/16/2022, 7:20 AM

## 2022-01-16 NOTE — Transfer of Care (Signed)
Immediate Anesthesia Transfer of Care Note  Patient: Brian Dodson  Procedure(s) Performed: CATARACT EXTRACTION PHACO AND INTRAOCULAR LENS PLACEMENT (IOC) RIGHT DIABETIC 4.48 00:34.3 (Right: Eye)  Patient Location: PACU  Anesthesia Type: MAC  Level of Consciousness: awake, alert  and patient cooperative  Airway and Oxygen Therapy: Patient Spontanous Breathing and Patient connected to supplemental oxygen  Post-op Assessment: Post-op Vital signs reviewed, Patient's Cardiovascular Status Stable, Respiratory Function Stable, Patent Airway and No signs of Nausea or vomiting  Post-op Vital Signs: Reviewed and stable  Complications: No notable events documented.

## 2022-01-16 NOTE — Op Note (Signed)
PREOPERATIVE DIAGNOSIS:  Nuclear sclerotic cataract of the right eye.   POSTOPERATIVE DIAGNOSIS:  H25.11 Cataract   OPERATIVE PROCEDURE:ORPROCALL@   SURGEON:  Birder Robson, MD.   ANESTHESIA:  Anesthesiologist: Rochel Brome, MD CRNA: Garner Nash, CRNA  1.      Managed anesthesia care. 2.      0.75ml of Shugarcaine was instilled in the eye following the paracentesis.   COMPLICATIONS:  None.   TECHNIQUE:   Stop and chop   DESCRIPTION OF PROCEDURE:  The patient was examined and consented in the preoperative holding area where the aforementioned topical anesthesia was applied to the right eye and then brought back to the Operating Room where the right eye was prepped and draped in the usual sterile ophthalmic fashion and a lid speculum was placed. A paracentesis was created with the side port blade and the anterior chamber was filled with viscoelastic. A near clear corneal incision was performed with the steel keratome. A continuous curvilinear capsulorrhexis was performed with a cystotome followed by the capsulorrhexis forceps. Hydrodissection and hydrodelineation were carried out with BSS on a blunt cannula. The lens was removed in a stop and chop  technique and the remaining cortical material was removed with the irrigation-aspiration handpiece. The capsular bag was inflated with viscoelastic and the Technis [id number]  lens was placed in the capsular bag without complication. The remaining viscoelastic was removed from the eye with the irrigation-aspiration handpiece. The wounds were hydrated. The anterior chamber was flushed with BSS and the eye was inflated to physiologic pressure. 0.71ml of Vigamox was placed in the anterior chamber. The wounds were found to be water tight. The eye was dressed with Combigan. The patient was given protective glasses to wear throughout the day and a shield with which to sleep tonight. The patient was also given drops with which to begin a drop regimen today and  will follow-up with me in one day. Implant Name Type Inv. Item Serial No. Manufacturer Lot No. LRB No. Used Action  LENS IOL TECNIS EYHANCE 22.0 - [id number] Intraocular Lens LENS IOL TECNIS EYHANCE 22.0 [id number] [employer]  Right 1 Implanted   Procedure(s) with comments: CATARACT EXTRACTION PHACO AND INTRAOCULAR LENS PLACEMENT (IOC) RIGHT DIABETIC 4.48 00:34.3 (Right) - Diabetic  Electronically signed: Birder Robson 01/16/2022 7:54 AM

## 2022-01-16 NOTE — Anesthesia Postprocedure Evaluation (Signed)
Anesthesia Post Note  Patient: Brian Dodson  Procedure(s) Performed: CATARACT EXTRACTION PHACO AND INTRAOCULAR LENS PLACEMENT (IOC) RIGHT DIABETIC 4.48 00:34.3 (Right: Eye)     Patient location during evaluation: PACU Anesthesia Type: MAC Level of consciousness: awake and alert Pain management: pain level controlled Vital Signs Assessment: post-procedure vital signs reviewed and stable Respiratory status: spontaneous breathing, nonlabored ventilation, respiratory function stable and patient connected to nasal cannula oxygen Cardiovascular status: stable and blood pressure returned to baseline Postop Assessment: no apparent nausea or vomiting Anesthetic complications: no   No notable events documented.  Trecia Rogers

## 2022-01-23 ENCOUNTER — Encounter: Payer: Self-pay | Admitting: Ophthalmology

## 2022-01-30 ENCOUNTER — Ambulatory Visit: Admit: 2022-01-30 | Payer: Medicare Other | Admitting: Ophthalmology

## 2022-01-30 SURGERY — PHACOEMULSIFICATION, CATARACT, WITH IOL INSERTION
Anesthesia: Topical | Laterality: Left

## 2022-02-12 ENCOUNTER — Inpatient Hospital Stay
Admission: EM | Admit: 2022-02-12 | Discharge: 2022-02-15 | DRG: 603 | Disposition: A | Discharge status: Home Health | Payer: Medicare Other | Source: Skilled Nursing Facility | Attending: Internal Medicine | Admitting: Internal Medicine

## 2022-02-12 ENCOUNTER — Encounter: Payer: Self-pay | Admitting: Emergency Medicine

## 2022-02-12 DIAGNOSIS — Z79890 Hormone replacement therapy (postmenopausal): Secondary | ICD-10-CM

## 2022-02-12 DIAGNOSIS — B9562 Methicillin resistant Staphylococcus aureus infection as the cause of diseases classified elsewhere: Secondary | ICD-10-CM | POA: Diagnosis present

## 2022-02-12 DIAGNOSIS — E869 Volume depletion, unspecified: Secondary | ICD-10-CM | POA: Diagnosis present

## 2022-02-12 DIAGNOSIS — R4189 Other symptoms and signs involving cognitive functions and awareness: Secondary | ICD-10-CM | POA: Diagnosis present

## 2022-02-12 DIAGNOSIS — Z833 Family history of diabetes mellitus: Secondary | ICD-10-CM

## 2022-02-12 DIAGNOSIS — F209 Schizophrenia, unspecified: Secondary | ICD-10-CM | POA: Diagnosis present

## 2022-02-12 DIAGNOSIS — I129 Hypertensive chronic kidney disease with stage 1 through stage 4 chronic kidney disease, or unspecified chronic kidney disease: Secondary | ICD-10-CM | POA: Diagnosis present

## 2022-02-12 DIAGNOSIS — Z888 Allergy status to other drugs, medicaments and biological substances status: Secondary | ICD-10-CM

## 2022-02-12 DIAGNOSIS — D631 Anemia in chronic kidney disease: Secondary | ICD-10-CM | POA: Diagnosis present

## 2022-02-12 DIAGNOSIS — Z881 Allergy status to other antibiotic agents status: Secondary | ICD-10-CM | POA: Diagnosis not present

## 2022-02-12 DIAGNOSIS — F0390 Unspecified dementia without behavioral disturbance: Secondary | ICD-10-CM | POA: Diagnosis present

## 2022-02-12 DIAGNOSIS — N400 Enlarged prostate without lower urinary tract symptoms: Secondary | ICD-10-CM | POA: Diagnosis present

## 2022-02-12 DIAGNOSIS — I480 Paroxysmal atrial fibrillation: Secondary | ICD-10-CM | POA: Diagnosis present

## 2022-02-12 DIAGNOSIS — Z7901 Long term (current) use of anticoagulants: Secondary | ICD-10-CM | POA: Diagnosis not present

## 2022-02-12 DIAGNOSIS — Z961 Presence of intraocular lens: Secondary | ICD-10-CM | POA: Diagnosis present

## 2022-02-12 DIAGNOSIS — N179 Acute kidney failure, unspecified: Secondary | ICD-10-CM | POA: Diagnosis present

## 2022-02-12 DIAGNOSIS — Z7982 Long term (current) use of aspirin: Secondary | ICD-10-CM

## 2022-02-12 DIAGNOSIS — L02416 Cutaneous abscess of left lower limb: Secondary | ICD-10-CM | POA: Diagnosis present

## 2022-02-12 DIAGNOSIS — K219 Gastro-esophageal reflux disease without esophagitis: Secondary | ICD-10-CM | POA: Diagnosis present

## 2022-02-12 DIAGNOSIS — I2510 Atherosclerotic heart disease of native coronary artery without angina pectoris: Secondary | ICD-10-CM | POA: Diagnosis present

## 2022-02-12 DIAGNOSIS — Z79899 Other long term (current) drug therapy: Secondary | ICD-10-CM

## 2022-02-12 DIAGNOSIS — Z87891 Personal history of nicotine dependence: Secondary | ICD-10-CM

## 2022-02-12 DIAGNOSIS — E785 Hyperlipidemia, unspecified: Secondary | ICD-10-CM | POA: Diagnosis present

## 2022-02-12 DIAGNOSIS — Z9841 Cataract extraction status, right eye: Secondary | ICD-10-CM

## 2022-02-12 DIAGNOSIS — L03116 Cellulitis of left lower limb: Secondary | ICD-10-CM | POA: Diagnosis present

## 2022-02-12 DIAGNOSIS — F0150 Vascular dementia without behavioral disturbance: Secondary | ICD-10-CM | POA: Diagnosis present

## 2022-02-12 DIAGNOSIS — E1122 Type 2 diabetes mellitus with diabetic chronic kidney disease: Secondary | ICD-10-CM | POA: Diagnosis present

## 2022-02-12 DIAGNOSIS — Z955 Presence of coronary angioplasty implant and graft: Secondary | ICD-10-CM | POA: Diagnosis not present

## 2022-02-12 DIAGNOSIS — D638 Anemia in other chronic diseases classified elsewhere: Secondary | ICD-10-CM | POA: Diagnosis present

## 2022-02-12 DIAGNOSIS — E119 Type 2 diabetes mellitus without complications: Secondary | ICD-10-CM

## 2022-02-12 DIAGNOSIS — N182 Chronic kidney disease, stage 2 (mild): Secondary | ICD-10-CM | POA: Diagnosis present

## 2022-02-12 DIAGNOSIS — J449 Chronic obstructive pulmonary disease, unspecified: Secondary | ICD-10-CM | POA: Diagnosis present

## 2022-02-12 DIAGNOSIS — I10 Essential (primary) hypertension: Secondary | ICD-10-CM | POA: Diagnosis present

## 2022-02-12 LAB — CBC WITH DIFFERENTIAL/PLATELET
Abs Immature Granulocytes: 0.08 10*3/uL — ABNORMAL HIGH (ref 0.00–0.07)
Basophils Absolute: 0 10*3/uL (ref 0.0–0.1)
Basophils Relative: 0 %
Eosinophils Absolute: 0 10*3/uL (ref 0.0–0.5)
Eosinophils Relative: 0 %
HCT: 31.6 % — ABNORMAL LOW (ref 39.0–52.0)
Hemoglobin: 9.9 g/dL — ABNORMAL LOW (ref 13.0–17.0)
Immature Granulocytes: 1 %
Lymphocytes Relative: 16 %
Lymphs Abs: 2.2 10*3/uL (ref 0.7–4.0)
MCH: 31.4 pg (ref 26.0–34.0)
MCHC: 31.3 g/dL (ref 30.0–36.0)
MCV: 100.3 fL — ABNORMAL HIGH (ref 80.0–100.0)
Monocytes Absolute: 1.3 10*3/uL — ABNORMAL HIGH (ref 0.1–1.0)
Monocytes Relative: 9 %
Neutro Abs: 10.3 10*3/uL — ABNORMAL HIGH (ref 1.7–7.7)
Neutrophils Relative %: 74 %
Platelets: 177 10*3/uL (ref 150–400)
RBC: 3.15 MIL/uL — ABNORMAL LOW (ref 4.22–5.81)
RDW: 14.8 % (ref 11.5–15.5)
WBC: 14 10*3/uL — ABNORMAL HIGH (ref 4.0–10.5)
nRBC: 0 % (ref 0.0–0.2)

## 2022-02-12 LAB — BASIC METABOLIC PANEL
Anion gap: 7 (ref 5–15)
BUN: 40 mg/dL — ABNORMAL HIGH (ref 8–23)
CO2: 26 mmol/L (ref 22–32)
Calcium: 8.8 mg/dL — ABNORMAL LOW (ref 8.9–10.3)
Chloride: 103 mmol/L (ref 98–111)
Creatinine, Ser: 1.51 mg/dL — ABNORMAL HIGH (ref 0.61–1.24)
GFR, Estimated: 51 mL/min — ABNORMAL LOW (ref >60)
Glucose, Bld: 115 mg/dL — ABNORMAL HIGH (ref 70–99)
Potassium: 4.5 mmol/L (ref 3.5–5.1)
Sodium: 136 mmol/L (ref 135–145)

## 2022-02-12 MED ORDER — HYDROCODONE-ACETAMINOPHEN 5-325 MG PO TABS: 1.0000 | ORAL_TABLET | ORAL | Status: DC | PRN

## 2022-02-12 MED ORDER — AMIODARONE HCL 200 MG PO TABS
200.0000 mg | ORAL_TABLET | Freq: Every day | ORAL | Status: DC
  Administered 2022-02-13 – 2022-02-15 (×3): 200 mg via ORAL
  Filled 2022-02-12 (×3): qty 1

## 2022-02-12 MED ORDER — FERROUS SULFATE 325 (65 FE) MG PO TABS
325.0000 mg | ORAL_TABLET | Freq: Every day | ORAL | Status: DC
  Administered 2022-02-13 – 2022-02-15 (×3): 325 mg via ORAL
  Filled 2022-02-12 (×3): qty 1

## 2022-02-12 MED ORDER — SODIUM CHLORIDE 0.9 % IV SOLN
1.0000 g | Freq: Once | INTRAVENOUS | Status: AC
  Administered 2022-02-12: 1 g via INTRAVENOUS
  Filled 2022-02-12: qty 10

## 2022-02-12 MED ORDER — SODIUM CHLORIDE 0.9 % IV SOLN
1.0000 g | INTRAVENOUS | Status: DC
  Administered 2022-02-13: 1 g via INTRAVENOUS
  Filled 2022-02-12: qty 1
  Filled 2022-02-12: qty 10

## 2022-02-12 MED ORDER — CITALOPRAM HYDROBROMIDE 20 MG PO TABS
20.0000 mg | ORAL_TABLET | Freq: Every day | ORAL | Status: DC
  Administered 2022-02-13 – 2022-02-15 (×3): 20 mg via ORAL
  Filled 2022-02-12 (×3): qty 1

## 2022-02-12 MED ORDER — ACETAMINOPHEN 650 MG RE SUPP: 650.0000 mg | Freq: Four times a day (QID) | RECTAL | Status: DC | PRN

## 2022-02-12 MED ORDER — VANCOMYCIN HCL 1750 MG/350ML IV SOLN: 1750.0000 mg | INTRAVENOUS | Status: DC

## 2022-02-12 MED ORDER — PANTOPRAZOLE SODIUM 40 MG PO TBEC
40.0000 mg | DELAYED_RELEASE_TABLET | Freq: Every day | ORAL | Status: DC
  Administered 2022-02-13 – 2022-02-15 (×3): 40 mg via ORAL
  Filled 2022-02-12 (×3): qty 1

## 2022-02-12 MED ORDER — ACETAMINOPHEN 325 MG PO TABS
650.0000 mg | ORAL_TABLET | Freq: Four times a day (QID) | ORAL | Status: DC | PRN
Start: 2022-02-12 — End: 2022-02-15

## 2022-02-12 MED ORDER — MORPHINE SULFATE (PF) 2 MG/ML IV SOLN: 2.0000 mg | INTRAVENOUS | Status: DC | PRN

## 2022-02-12 MED ORDER — VANCOMYCIN HCL IN DEXTROSE 1-5 GM/200ML-% IV SOLN: 1000.0000 mg | Freq: Once | INTRAVENOUS | Status: DC

## 2022-02-12 MED ORDER — ONDANSETRON HCL 4 MG/2ML IJ SOLN: 4.0000 mg | Freq: Four times a day (QID) | INTRAMUSCULAR | Status: DC | PRN

## 2022-02-12 MED ORDER — CARVEDILOL 12.5 MG PO TABS
12.5000 mg | ORAL_TABLET | Freq: Two times a day (BID) | ORAL | Status: DC
  Administered 2022-02-13 – 2022-02-15 (×5): 12.5 mg via ORAL
  Filled 2022-02-12: qty 2
  Filled 2022-02-12 (×4): qty 1

## 2022-02-12 MED ORDER — ATORVASTATIN CALCIUM 10 MG PO TABS
10.0000 mg | ORAL_TABLET | Freq: Every day | ORAL | Status: DC
  Administered 2022-02-13 – 2022-02-15 (×3): 10 mg via ORAL
  Filled 2022-02-12 (×3): qty 1

## 2022-02-12 MED ORDER — LACTATED RINGERS IV BOLUS
1000.0000 mL | Freq: Once | INTRAVENOUS | Status: AC
Start: 2022-02-12 — End: 2022-02-13
  Administered 2022-02-12: 1000 mL via INTRAVENOUS

## 2022-02-12 MED ORDER — INSULIN ASPART 100 UNIT/ML IJ SOLN: 0.0000 [IU] | Freq: Every day | INTRAMUSCULAR | Status: DC

## 2022-02-12 MED ORDER — ASPIRIN EC 81 MG PO TBEC
81.0000 mg | DELAYED_RELEASE_TABLET | Freq: Every day | ORAL | Status: DC
  Administered 2022-02-13 – 2022-02-15 (×3): 81 mg via ORAL
  Filled 2022-02-12 (×3): qty 1

## 2022-02-12 MED ORDER — BRIMONIDINE TARTRATE-TIMOLOL 0.2-0.5 % OP SOLN: 1.0000 [drp] | Freq: Two times a day (BID) | OPHTHALMIC | Status: DC

## 2022-02-12 MED ORDER — VANCOMYCIN HCL 2000 MG/400ML IV SOLN
2000.0000 mg | Freq: Once | INTRAVENOUS | Status: AC
  Administered 2022-02-13: 2000 mg via INTRAVENOUS
  Filled 2022-02-12: qty 400

## 2022-02-12 MED ORDER — APIXABAN 5 MG PO TABS
5.0000 mg | ORAL_TABLET | Freq: Two times a day (BID) | ORAL | Status: DC
  Administered 2022-02-13 – 2022-02-15 (×6): 5 mg via ORAL
  Filled 2022-02-12 (×6): qty 1

## 2022-02-12 MED ORDER — MAGNESIUM OXIDE -MG SUPPLEMENT 400 (240 MG) MG PO TABS
400.0000 mg | ORAL_TABLET | Freq: Every day | ORAL | Status: DC
  Administered 2022-02-13 – 2022-02-15 (×3): 400 mg via ORAL
  Filled 2022-02-12 (×3): qty 1

## 2022-02-12 MED ORDER — LORATADINE 10 MG PO TABS
10.0000 mg | ORAL_TABLET | Freq: Every day | ORAL | Status: DC
  Administered 2022-02-13 – 2022-02-15 (×3): 10 mg via ORAL
  Filled 2022-02-12 (×3): qty 1

## 2022-02-12 MED ORDER — DONEPEZIL HCL 5 MG PO TABS
10.0000 mg | ORAL_TABLET | Freq: Every day | ORAL | Status: DC
  Administered 2022-02-13 – 2022-02-14 (×3): 10 mg via ORAL
  Filled 2022-02-12 (×3): qty 2

## 2022-02-12 MED ORDER — BENZTROPINE MESYLATE 0.5 MG PO TABS
1.0000 mg | ORAL_TABLET | Freq: Every day | ORAL | Status: DC
  Administered 2022-02-13 – 2022-02-14 (×3): 1 mg via ORAL
  Filled 2022-02-12: qty 2
  Filled 2022-02-12: qty 1
  Filled 2022-02-12: qty 2

## 2022-02-12 MED ORDER — INSULIN ASPART 100 UNIT/ML IJ SOLN
0.0000 [IU] | Freq: Three times a day (TID) | INTRAMUSCULAR | Status: DC
  Administered 2022-02-13: 2 [IU] via SUBCUTANEOUS
  Administered 2022-02-14: 3 [IU] via SUBCUTANEOUS
  Administered 2022-02-15: 2 [IU] via SUBCUTANEOUS
  Filled 2022-02-12 (×3): qty 1

## 2022-02-12 MED ORDER — LATANOPROST 0.005 % OP SOLN
1.0000 [drp] | Freq: Every day | OPHTHALMIC | Status: DC
Start: 2022-02-12 — End: 2022-02-13

## 2022-02-12 MED ORDER — DIVALPROEX SODIUM ER 500 MG PO TB24
2000.0000 mg | ORAL_TABLET | Freq: Every day | ORAL | Status: DC
  Administered 2022-02-13 – 2022-02-14 (×3): 2000 mg via ORAL
  Filled 2022-02-12 (×2): qty 4
  Filled 2022-02-12: qty 8

## 2022-02-12 MED ORDER — MELATONIN 5 MG PO TABS
5.0000 mg | ORAL_TABLET | Freq: Every day | ORAL | Status: DC
  Administered 2022-02-13 – 2022-02-14 (×3): 5 mg via ORAL
  Filled 2022-02-12 (×3): qty 1

## 2022-02-12 MED ORDER — RISPERIDONE 3 MG PO TABS
4.0000 mg | ORAL_TABLET | Freq: Every day | ORAL | Status: DC
  Administered 2022-02-13 – 2022-02-14 (×3): 4 mg via ORAL
  Filled 2022-02-12: qty 4
  Filled 2022-02-12 (×2): qty 1

## 2022-02-12 MED ORDER — LIDOCAINE-EPINEPHRINE 2 %-1:100000 IJ SOLN
20.0000 mL | Freq: Once | INTRAMUSCULAR | Status: AC
  Administered 2022-02-12: 20 mL
  Filled 2022-02-12: qty 1

## 2022-02-12 MED ORDER — ONDANSETRON HCL 4 MG PO TABS: 4.0000 mg | ORAL_TABLET | Freq: Four times a day (QID) | ORAL | Status: DC | PRN

## 2022-02-12 MED ORDER — SODIUM CHLORIDE 0.9 % IV SOLN: INTRAVENOUS | Status: AC

## 2022-02-12 NOTE — Assessment & Plan Note (Addendum)
Continue donepezil and Celexa ?

## 2022-02-12 NOTE — Assessment & Plan Note (Addendum)
-   Baseline hemoglobin approximately 9 to 10 g/dL.  Currently at baseline ?

## 2022-02-12 NOTE — Assessment & Plan Note (Addendum)
-   S/p I&D in the ED on 3/27 ?- started on Rocephin and Vanc on admission ?- CT left thigh obtained shows severe tissue edema but no drainable fluid collection remaining ?- appreciate surgery recommendations: pack wound with iodoform packing strips to ensure closure from inside out ?- needs daily packing/dressing changes; ALF reports should be able to honor this at discharge ?- wound culture noted now with MRSA; de-escalated to doxy to complete course at discharge  ?-Home health also ordered for discharge to aid with recovery ?

## 2022-02-12 NOTE — Assessment & Plan Note (Addendum)
Continue aspirin, atorvastatin and carvedilol 

## 2022-02-12 NOTE — ED Notes (Signed)
Pt given meal tray and ginger ale 

## 2022-02-12 NOTE — ED Provider Notes (Signed)
? ?[facility] ?Provider Note ? ? ? Event Date/Time  ? First MD Initiated Contact with Patient 02/12/22 1956   ?  (approximate) ? ? ?History  ? ?Chief Complaint ?Fever ? ? ?HPI ? ?Brian Dodson is a 66 y.o. male with past medical history of hypertension, diabetes, CAD, atrial fibrillation on Eliquis, schizophrenia, and dementia who presents to the ED for leg pain.  History is limited due to patient's schizophrenia and dementia.  Per EMS, staff at his group home was concerned that he has been dealing with fever for the past day, however they were unable to specify how high his temperature had gotten.  Patient complains of pain to his posterior left upper thigh, where EMS noted redness and swelling.  Patient has not noticed any drainage from the area, denies any trauma to his leg.  He is not aware of any fevers.  He denies any pain or swelling to his groin area, has not had any difficulty urinating or defecating. ?  ? ? ?Physical Exam  ? ?Triage Vital Signs: ?ED Triage Vitals [02/12/22 1952]  ?Enc Vitals Group  ?   BP 115/78  ?   Pulse Rate 93  ?   Resp 17  ?   Temp 98.6 ?F (37 ?C)  ?   Temp Source Oral  ?   SpO2 97 %  ?   Weight   ?   Height   ?   Head Circumference   ?   Peak Flow   ?   Pain Score   ?   Pain Loc   ?   Pain Edu?   ?   Excl. in [place]?   ? ? ?Most recent vital signs: ?Vitals:  ? 02/12/22 2145 02/12/22 2200  ?BP: 111/74 120/67  ?Pulse: (!) 109 79  ?Resp:    ?Temp:    ?SpO2: 95% 96%  ? ? ?Constitutional: Alert and oriented. ?Eyes: Conjunctivae are normal. ?Head: Atraumatic. ?Nose: No congestion/rhinnorhea. ?Mouth/Throat: Mucous membranes are moist.  ?Cardiovascular: Normal rate, regular rhythm. Grossly normal heart sounds.  2+ radial pulses bilaterally. ?Respiratory: Normal respiratory effort.  No retractions. Lungs CTAB. ?Gastrointestinal: Soft and nontender. No distention. ?Musculoskeletal: Erythema, edema, and warmth noted to left posterior upper thigh with significant induration  and central fluctuance.  Erythema extends over entire posterior portion of left upper leg. ?Neurologic:  Normal speech and language. No gross focal neurologic deficits are appreciated. ? ? ? ?ED Results / Procedures / Treatments  ? ?Labs ?(all labs ordered are listed, but only abnormal results are displayed) ?Labs Reviewed  ?CBC WITH DIFFERENTIAL/PLATELET - Abnormal; Notable for the following components:  ?    Result Value  ? WBC 14.0 (*)   ? RBC 3.15 (*)   ? Hemoglobin 9.9 (*)   ? HCT 31.6 (*)   ? MCV 100.3 (*)   ? Neutro Abs 10.3 (*)   ? Monocytes Absolute 1.3 (*)   ? Abs Immature Granulocytes 0.08 (*)   ? All other components within normal limits  ?BASIC METABOLIC PANEL - Abnormal; Notable for the following components:  ? Glucose, Bld 115 (*)   ? BUN 40 (*)   ? Creatinine, Ser 1.51 (*)   ? Calcium 8.8 (*)   ? GFR, Estimated 51 (*)   ? All other components within normal limits  ?BASIC METABOLIC PANEL  ?CBC  ? ? ? ?PROCEDURES: ? ?Critical Care performed: No ? ?Marland Kitchen.Incision and Drainage ? ?Date/Time: 02/12/2022 11:47 PM ?Performed by:  Blake Divine, MD ?Authorized by: Blake Divine, MD  ? ?Consent:  ?  Consent obtained:  Verbal ?  Consent given by:  Patient ?  Risks, benefits, and alternatives were discussed: yes   ?  Risks discussed:  Bleeding, incomplete drainage, pain, infection and damage to other organs ?  Alternatives discussed:  No treatment ?Universal protocol:  ?  Patient identity confirmed:  Verbally with patient and arm band ?Location:  ?  Type:  Abscess ?  Location:  Lower extremity ?  Lower extremity location:  Leg ?  Leg location:  L upper leg ?Pre-procedure details:  ?  Skin preparation:  Chlorhexidine ?Sedation:  ?  Sedation type:  None ?Anesthesia:  ?  Anesthesia method:  Local infiltration ?  Local anesthetic:  Lidocaine 2% WITH epi ?Procedure type:  ?  Complexity:  Simple ?Procedure details:  ?  Ultrasound guidance: yes   ?  Needle aspiration: no   ?  Incision types:  Elliptical ?  Wound  management:  Probed and deloculated ?  Drainage:  Bloody and purulent ?  Drainage amount:  Moderate ?  Wound treatment:  Wound left open ?  Packing materials:  None ?Post-procedure details:  ?  Procedure completion:  Tolerated well, no immediate complications ? ? ?MEDICATIONS ORDERED IN ED: ?Medications  ?amiodarone (PACERONE) tablet 200 mg (has no administration in time range)  ?carvedilol (COREG) tablet 12.5 mg (has no administration in time range)  ?atorvastatin (LIPITOR) tablet 10 mg (has no administration in time range)  ?aspirin EC tablet 81 mg (has no administration in time range)  ?citalopram (CELEXA) tablet 20 mg (has no administration in time range)  ?donepezil (ARICEPT) tablet 10 mg (has no administration in time range)  ?risperiDONE (RISPERDAL) tablet 4 mg (has no administration in time range)  ?magnesium oxide (MAG-OX) tablet 400 mg (has no administration in time range)  ?pantoprazole (PROTONIX) EC tablet 40 mg (has no administration in time range)  ?apixaban (ELIQUIS) tablet 5 mg (has no administration in time range)  ?ferrous sulfate tablet 325 mg (has no administration in time range)  ?melatonin tablet 5 mg (has no administration in time range)  ?benztropine (COGENTIN) tablet 1 mg (has no administration in time range)  ?divalproex (DEPAKOTE ER) 24 hr tablet 2,000 mg (has no administration in time range)  ?loratadine (CLARITIN) tablet 10 mg (has no administration in time range)  ?brimonidine-timolol (COMBIGAN) 0.2-0.5 % ophthalmic solution 1 drop (has no administration in time range)  ?latanoprost (XALATAN) 0.005 % ophthalmic solution 1 drop (has no administration in time range)  ?acetaminophen (TYLENOL) tablet 650 mg (has no administration in time range)  ?  Or  ?acetaminophen (TYLENOL) suppository 650 mg (has no administration in time range)  ?ondansetron (ZOFRAN) tablet 4 mg (has no administration in time range)  ?  Or  ?ondansetron (ZOFRAN) injection 4 mg (has no administration in time range)   ?insulin aspart (novoLOG) injection 0-15 Units (has no administration in time range)  ?insulin aspart (novoLOG) injection 0-5 Units (has no administration in time range)  ?cefTRIAXone (ROCEPHIN) 1 g in sodium chloride 0.9 % 100 mL IVPB (has no administration in time range)  ?0.9 %  sodium chloride infusion (has no administration in time range)  ?HYDROcodone-acetaminophen (NORCO/VICODIN) 5-325 MG per tablet 1-2 tablet (has no administration in time range)  ?morphine (PF) 2 MG/ML injection 2 mg (has no administration in time range)  ?vancomycin (VANCOREADY) IVPB 2000 mg/400 mL (has no administration in time range)  ?vancomycin (VANCOREADY) IVPB 1750 mg/350  mL (has no administration in time range)  ?lidocaine-EPINEPHrine (XYLOCAINE W/EPI) 2 %-1:100000 (with pres) injection 20 mL (20 mLs Infiltration Given by Other 02/12/22 2145)  ?lactated ringers bolus 1,000 mL (1,000 mLs Intravenous New Bag/Given 02/12/22 2213)  ?cefTRIAXone (ROCEPHIN) 1 g in sodium chloride 0.9 % 100 mL IVPB (1 g Intravenous New Bag/Given 02/12/22 2213)  ? ? ? ?IMPRESSION / MDM / ASSESSMENT AND PLAN / ED COURSE  ?I reviewed the triage vital signs and the nursing notes. ?             ?               ? ?66 y.o. male with past medical history of hypertension, diabetes, CAD, atrial fibrillation on Eliquis, schizophrenia, and dementia who presents to the ED with 24 hours of increasing pain and swelling to left posterior thigh along with concern for fever. ? ?Differential diagnosis includes, but is not limited to, abscess, cellulitis, DVT, sepsis. ? ?Patient nontoxic-appearing and in no acute distress, vital signs are reassuring here in the ED with no fever.  He has what appears to be extensive cellulitis to his left posterior thigh and central abscess.  Bedside ultrasound revealed central fluid pocket and incision and drainage was performed with significant purulent drainage.  Given patient's significant cellulitis with limited insight and difficult social  situation, we will treat with IV antibiotics and admit to the hospital for observation.  Labs remarkable for mild AKI, CBC shows leukocytosis with stable anemia. ? ?  ? ? ?FINAL CLINICAL IMPRESSION(S) / ED DIAGNOS

## 2022-02-12 NOTE — Assessment & Plan Note (Addendum)
Continue carvedilol 

## 2022-02-12 NOTE — ED Triage Notes (Signed)
Pt arrived via [facility] from [facility] with c/o fever x1 day with raised/reddened area noted to the left posterior upper thigh. Unknown how long area has been present. No drainage or open area noted.  ?

## 2022-02-12 NOTE — ED Notes (Signed)
Wound dressed with telfa and silk tape; pt encouraged to stay in bed ? ?

## 2022-02-12 NOTE — Progress Notes (Signed)
Pharmacy Antibiotic Note ? ?Brian Dodson is a 66 y.o. male admitted on 02/12/2022 with cellulitis.  Pharmacy has been consulted for Vancomycin dosing. ? ?Plan: ?Vancomycin 2 gm IV X 1 ordered for 03/28 @ ~ 0000. ?Vancomycin 1750 mg IV Q24H ordered to start on 3/29 @ 0000. ? ?AUC = 516.8 ?Vanc trough = 12.6  ? ?Height: 6' (182.9 cm) ?Weight: 97.5 kg (215 lb) ?IBW/kg (Calculated) : 77.6 ? ?Temp (24hrs), Avg:98.6 ?F (37 ?C), Min:98.6 ?F (37 ?C), Max:98.6 ?F (37 ?C) ? ?Recent Labs  ?Lab 02/12/22 ?2013  ?WBC 14.0*  ?CREATININE 1.51*  ?  ?Estimated Creatinine Clearance: 58.3 mL/min (A) (by C-G formula based on SCr of 1.51 mg/dL (H)).   ? ?Allergies  ?Allergen Reactions  ? Sulfa Antibiotics Other (See Comments)  ?  Unknown reaction  ? Xarelto [Rivaroxaban]   ? ? ?Antimicrobials this admission: ?  >>  ?  >>  ? ?Dose adjustments this admission: ? ? ?Microbiology results: ? BCx:  ? UCx:   ? Sputum:   ? MRSA PCR:  ? ?Thank you for allowing pharmacy to be a part of this patient?s care. ? ?Roanne Haye D ?02/12/2022 11:24 PM ? ?

## 2022-02-12 NOTE — H&P (Signed)
?History and Physical  ? ? ?Patient: Brian Dodson:[id number] DOB: October 10, 1956 ?DOA: 02/12/2022 ?DOS: the patient was seen and examined on 02/12/2022 ?PCP: Galvin Proffer, MD  ?Patient coming from: SNF ? ?Chief Complaint:  ?Chief Complaint  ?Patient presents with  ? Fever  ? ? ?HPI: Brian Dodson is a 66 y.o. male with medical history significant for  Paroxysmal A. fib on Eliquis and amiodarone,, HTN, DM, CAD, cognitive impairment, schizophrenia, who presents to the ED for evaluation of a fever.  He was also noted to have an area of redness on the posterior left thigh.  History is limited due to cognitive deficit ?ED course: Mildly tachycardic to 109 but with otherwise normal vitals.  Blood work significant for WBC 14,000 with hemoglobin 9.9 which was his baseline.  BMP significant for creatinine of 1.51 which is above his baseline of 0.95.  Patient was started on Rocephin and vancomycin.  Hospitalist consulted for admission. ? ?Review of Systems  ?Unable to perform ROS: Psychiatric disorder  patient is denying all complaints ? ?Past Medical History:  ?Diagnosis Date  ? Anemia   ? Asthma   ? Atrial fibrillation (HCC)   ? Benign prostatic hypertrophy with lower urinary tract symptoms (LUTS)   ? Bulbous urethral stricture   ? CAD (coronary artery disease)   ? s/p coronary stent 2003  ? CKD (chronic kidney disease)   ? Constipation   ? COPD (chronic obstructive pulmonary disease) (HCC)   ? Diabetes mellitus without complication (HCC)   ? type 2  ? Dizziness   ? Dyspnea   ? GERD (gastroesophageal reflux disease)   ? Gross hematuria   ? Hyperlipemia   ? Hypertension   ? Lumbago   ? Palpitation   ? Schizophrenia (HCC)   ? Vascular dementia (HCC)   ? ?Past Surgical History:  ?Procedure Laterality Date  ? CATARACT EXTRACTION W/PHACO Right 01/16/2022  ? Procedure: CATARACT EXTRACTION PHACO AND INTRAOCULAR LENS PLACEMENT (IOC) RIGHT DIABETIC 4.48 00:34.3;  Surgeon: Galen Manila, MD;  Location: [facility];   Service: Ophthalmology;  Laterality: Right;  Diabetic  ? COLONOSCOPY    ? COLONOSCOPY WITH PROPOFOL N/A 09/22/2021  ? Procedure: COLONOSCOPY WITH PROPOFOL;  Surgeon: Regis Bill, MD;  Location: [facility] END[facility]COPY;  Service: Endoscopy;  Laterality: N/A;  ? COLONOSCOPY WITH PROPOFOL N/A 01/02/2022  ? Procedure: COLONOSCOPY WITH PROPOFOL;  Surgeon: Regis Bill, MD;  Location: [facility] ENDOSCOPY;  Service: Endoscopy;  Laterality: N/A;  ? CORONARY ANGIOPLASTY WITH STENT PLACEMENT  11/2003  ? kidney stent    ? TEE WITHOUT CARDIOVERSION N/A 09/20/2021  ? Procedure: TRANSESOPHAGEAL ECHOCARDIOGRAM (TEE);  Surgeon: Dalia Heading, MD;  Location: [facility]S;  Service: Cardiovascular;  Laterality: N/A;  ? ?Social History:  reports that he has quit smoking. He has never used smokeless tobacco. He reports that he does not drink alcohol and does not use drugs. ? ?Allergies  ?Allergen Reactions  ? Sulfa Antibiotics Other (See Comments)  ?  Unknown reaction  ? Xarelto [Rivaroxaban]   ? ? ?Family History  ?Problem Relation Age of Onset  ? Diabetes Other   ? Bladder Cancer Neg Hx   ? Kidney cancer Neg Hx   ? Prostate cancer Neg Hx   ? ? ?Prior to Admission medications   ?Medication Sig Start Date End Date Taking? Authorizing Provider  ?acetaminophen (TYLENOL) 500 MG tablet Take 1,000 mg by mouth every 6 (six) hours as needed for mild pain.   Yes [provider]  ?amiodarone (PACERONE) 200 MG tablet Take 1 tablet (200 mg total) by mouth daily. 07/26/21  Yes Charise Killian, MD  ?aspirin 81 MG tablet Take 81 mg by mouth daily.    Yes [provider]  ?atorvastatin (LIPITOR) 10 MG tablet Take 10 mg by mouth daily. 05/18/15  Yes [provider]  ?benztropine (COGENTIN) 1 MG tablet Take 1 mg by mouth at bedtime.    Yes [provider]  ?brimonidine-timolol (COMBIGAN) 0.2-0.5 % ophthalmic solution Place 1 drop into both eyes every 12 (twelve) hours. Wait 3 to 5 minutes between drops   Yes [provider]  ?carvedilol (COREG) 12.5 MG tablet Take 12.5 mg by mouth in the morning and at bedtime.   Yes [provider]  ?citalopram (CELEXA) 20 MG tablet Take 20 mg by mouth daily.   Yes [provider]  ?diclofenac Sodium (VOLTAREN) 1 % GEL Apply 2 g topically 4 (four) times daily as needed. Apply to neck and shoulders 08/18/21  Yes Lurene Shadow, MD  ?divalproex (DEPAKOTE ER) 500 MG 24 hr tablet Take 2,000 mg by mouth at bedtime.    Yes [provider]  ?donepezil (ARICEPT) 10 MG tablet Take 10 mg by mouth at bedtime.    Yes [provider]  ?ELIQUIS 5 MG TABS tablet Take 5 mg by mouth 2 (two) times daily. 05/18/15  Yes [provider]  ?ferrous sulfate 325 (65 FE) MG tablet Take 325 mg by mouth daily with breakfast.   Yes [provider]  ?furosemide (LASIX) 20 MG tablet Take 20 mg by mouth daily as needed. For edema (swelling) 06/22/21  Yes [provider]  ?lanolin/mineral oil (KERI/THERA-DERM) LOTN Apply 1 application topically as needed for dry skin. Apply to face after washing face daily   Yes [provider]  ?Loratadine 10 MG CAPS Take by mouth daily.   Yes [provider]  ?LUMIGAN 0.01 % SOLN Place 1 drop into both eyes at bedtime. 07/06/21  Yes [provider]  ?magnesium oxide (MAG-OX) 400 MG tablet Take 400 mg by mouth daily.   Yes [provider]  ?melatonin 5 MG TABS Take 5 mg by mouth at bedtime.   Yes [provider]  ?Menthol, Topical Analgesic, (BIOFREEZE) 4 % GEL Apply 1 application topically 3 (three) times daily. To lower back area as needed for pain   Yes [provider]  ?metFORMIN (GLUCOPHAGE) 500 MG tablet Take 500 mg by mouth 2 (two) times daily with a meal.   Yes [provider]  ?Neomycin-Bacitracin-Polymyxin (TRIPLE ANTIBIOTIC) [phone] OINT  11/30/21  Yes [provider]  ?omeprazole (PRILOSEC) 20 MG capsule Take 20 mg by mouth 2 (two) times  daily before a meal. May open and sprinkle in applesauce.   Yes [provider]  ?[name] (OYSTER CALCIUM) 500 MG TABS tablet Take 500 mg of elemental calcium by mouth 2 (two) times daily.   Yes [provider]  ?risperidone (RISPERDAL) 4 MG tablet Take 4 mg by mouth at bedtime. 05/18/15  Yes [provider]  ?sodium chloride (OCEAN) 0.65 % SOLN nasal spray Place 1 spray into both nostrils daily.   Yes [provider]  ?Cholecalciferol (EQL VITAMIN D3) 50 MCG (2000 UT) CAPS Take 1 capsule by mouth daily. ?Patient not taking: Reported on 02/12/2022    [provider]  ?DUREZOL 0.05 % EMUL  02/02/22   [provider]  ?ILEVRO 0.3 % ophthalmic suspension  02/02/22   [provider]  ?ketorolac (ACULAR) 0.5 % ophthalmic solution SMARTSIG:In Eye(s) ?Patient not taking: Reported on 02/12/2022 01/17/22   [provider]  ? ? ?Physical Exam: ?Vitals:  ? 02/12/22 1952 02/12/22 2000 02/12/22 2145 02/12/22 2200  ?BP: 115/78 114/76 111/74 120/67  ?Pulse: 93 100 (!) 109 79  ?Resp: 17     ?Temp: 98.6 ?F (37 ?C)     ?TempSrc: Oral     ?SpO2: 97% 95% 95% 96%  ? ?Physical Exam ?Vitals and nursing note reviewed.  ?Constitutional:   ?   General: He is not in acute distress. ?HENT:  ?   Head: Normocephalic and atraumatic.  ?Cardiovascular:  ?   Rate and Rhythm: Normal rate and regular rhythm.  ?   Pulses: Normal pulses.  ?   Heart sounds: Normal heart sounds.  ?Pulmonary:  ?   Effort: Pulmonary effort is normal.  ?   Breath sounds: Normal breath sounds.  ?Abdominal:  ?   Palpations: Abdomen is soft.  ?   Tenderness: There is no abdominal tenderness.  ?Musculoskeletal:  ?   Comments: See picture left posterior thigh s/p I&D in the ED  ?Skin: ?   Comments: See picture  ?Neurological:  ?   Mental Status: Mental status is at baseline.  ? ? ? ? ?Data Reviewed: ?Relevant notes from primary care and specialist visits, past discharge summaries as available in EHR, including  Care Everywhere. ?Prior diagnostic testing as pertinent to current admission diagnoses ?Updated medications and problem lists for reconciliation ?ED course, including vitals, labs, imaging, treatment and respon

## 2022-02-12 NOTE — Assessment & Plan Note (Addendum)
-   continue diet control  

## 2022-02-12 NOTE — Assessment & Plan Note (Addendum)
Stable, without acute issues.  Continue Risperdal, Depakote and Cogentin ?

## 2022-02-12 NOTE — Assessment & Plan Note (Addendum)
-  likely has component of CKD2 due to underlying DMII and varying renal function over time ?- patient has history of CKD2. Baseline creat ~ 1 - 1.1, eGFR 60-90 ?- patient presents with increase in creat >0.3 mg/dL above baseline, creat increase >1.5x baseline presumed to have occurred within past 7 days PTA ?-Creatinine 1.5 on admission ?-Presumed prerenal/volume depletion on admission ?- Responded well to fluids ?- Renal function back to baseline ?-Continue diet ?

## 2022-02-13 ENCOUNTER — Inpatient Hospital Stay: Payer: Medicare Other

## 2022-02-13 ENCOUNTER — Other Ambulatory Visit: Payer: Self-pay

## 2022-02-13 DIAGNOSIS — L03116 Cellulitis of left lower limb: Secondary | ICD-10-CM | POA: Diagnosis not present

## 2022-02-13 DIAGNOSIS — L02416 Cutaneous abscess of left lower limb: Secondary | ICD-10-CM | POA: Diagnosis not present

## 2022-02-13 DIAGNOSIS — N182 Chronic kidney disease, stage 2 (mild): Secondary | ICD-10-CM

## 2022-02-13 DIAGNOSIS — N179 Acute kidney failure, unspecified: Secondary | ICD-10-CM

## 2022-02-13 LAB — CBG MONITORING, ED
Glucose-Capillary: 132 mg/dL — ABNORMAL HIGH (ref 70–99)
Glucose-Capillary: 169 mg/dL — ABNORMAL HIGH (ref 70–99)
Glucose-Capillary: 74 mg/dL (ref 70–99)

## 2022-02-13 LAB — BASIC METABOLIC PANEL
Anion gap: 8 (ref 5–15)
BUN: 31 mg/dL — ABNORMAL HIGH (ref 8–23)
CO2: 24 mmol/L (ref 22–32)
Calcium: 8.4 mg/dL — ABNORMAL LOW (ref 8.9–10.3)
Chloride: 105 mmol/L (ref 98–111)
Creatinine, Ser: 1.18 mg/dL (ref 0.61–1.24)
GFR, Estimated: >60 mL/min (ref >60)
Glucose, Bld: 106 mg/dL — ABNORMAL HIGH (ref 70–99)
Potassium: 4.3 mmol/L (ref 3.5–5.1)
Sodium: 137 mmol/L (ref 135–145)

## 2022-02-13 LAB — CBC
HCT: 30.8 % — ABNORMAL LOW (ref 39.0–52.0)
Hemoglobin: 9.5 g/dL — ABNORMAL LOW (ref 13.0–17.0)
MCH: 31.7 pg (ref 26.0–34.0)
MCHC: 30.8 g/dL (ref 30.0–36.0)
MCV: 102.7 fL — ABNORMAL HIGH (ref 80.0–100.0)
Platelets: 159 10*3/uL (ref 150–400)
RBC: 3 MIL/uL — ABNORMAL LOW (ref 4.22–5.81)
RDW: 14.8 % (ref 11.5–15.5)
WBC: 15.2 10*3/uL — ABNORMAL HIGH (ref 4.0–10.5)
nRBC: 0 % (ref 0.0–0.2)

## 2022-02-13 LAB — GLUCOSE, CAPILLARY
Glucose-Capillary: 86 mg/dL (ref 70–99)
Glucose-Capillary: 128 mg/dL — ABNORMAL HIGH (ref 70–99)

## 2022-02-13 MED ORDER — VANCOMYCIN HCL 2000 MG/400ML IV SOLN
2000.0000 mg | INTRAVENOUS | Status: DC
  Administered 2022-02-14: 2000 mg via INTRAVENOUS
  Filled 2022-02-13: qty 400

## 2022-02-13 MED ORDER — IOHEXOL 300 MG/ML  SOLN
100.0000 mL | Freq: Once | INTRAMUSCULAR | Status: AC | PRN
  Administered 2022-02-13: 100 mL via INTRAVENOUS
  Filled 2022-02-13: qty 100

## 2022-02-13 MED ORDER — LATANOPROST 0.005 % OP SOLN
1.0000 [drp] | Freq: Every day | OPHTHALMIC | Status: DC
  Administered 2022-02-15: 1 [drp] via OPHTHALMIC
  Filled 2022-02-13: qty 2.5

## 2022-02-13 MED ORDER — BRIMONIDINE TARTRATE-TIMOLOL 0.2-0.5 % OP SOLN: 1.0000 [drp] | Freq: Two times a day (BID) | OPHTHALMIC | Status: DC

## 2022-02-13 NOTE — Consult Note (Signed)
[facility] ?SURGICAL CONSULTATION NOTE (initial) - [id number] ? ? ?HISTORY OF PRESENT ILLNESS (HPI):  ?66 y.o. male presented to [facility] ED yesterday for evaluation of fever. Patient has a history of schizophrenia and resides in a group home. His condition certainly limits history and this was primarily obtained through chart review and discussion with members of the medical team. He was reportedly found to have a subjective fever at his group home yesterday and EMS was called. Patient's only complaint is a "bump" on the back of his left thigh. He denied any pain in this area. EMS did note erythema and swelling. No further complaints, but again, this is limited secondary to his mental health. Work up in the ED revealed a leukocytosis to 14.0K (now 15.2K) and AKI with sCr - 1.50 (now resolved 1.18). He was found to have abscess to his posterior left thigh and underwent I&D with EDP. He was admitted to medicine and started on Vancomycin and Rocephin.  ? ?Surgery is consulted by hospitalist physician Dr. Dwyane Dee, MD in this context for evaluation and management of left thigh abscess. ? ?PAST MEDICAL HISTORY (PMH):  ?Past Medical History:  ?Diagnosis Date  ? Anemia   ? Asthma   ? Atrial fibrillation ([place])   ? Benign prostatic hypertrophy with lower urinary tract symptoms (LUTS)   ? Bulbous urethral stricture   ? CAD (coronary artery disease)   ? s/p coronary stent 2003  ? CKD (chronic kidney disease)   ? Constipation   ? COPD (chronic obstructive pulmonary disease) ([place])   ? Diabetes mellitus without complication ([place])   ? type 2  ? Dizziness   ? Dyspnea   ? GERD (gastroesophageal reflux disease)   ? Gross hematuria   ? Hyperlipemia   ? Hypertension   ? Lumbago   ? Palpitation   ? Schizophrenia ([place])   ? Vascular dementia ([place])   ?  ? ?PAST SURGICAL HISTORY ([place]):  ?Past Surgical History:  ?Procedure Laterality Date  ? CATARACT EXTRACTION W/PHACO Right 01/16/2022  ? Procedure: CATARACT EXTRACTION  PHACO AND INTRAOCULAR LENS PLACEMENT (IOC) RIGHT DIABETIC 4.48 00:34.3;  Surgeon: Birder Robson, MD;  Location: [place];  Service: Ophthalmology;  Laterality: Right;  Diabetic  ? COLONOSCOPY    ? COLONOSCOPY WITH PROPOFOL N/A 09/22/2021  ? Procedure: COLONOSCOPY WITH PROPOFOL;  Surgeon: Lesly Rubenstein, MD;  Location: [facility] ENDOSCOPY;  Service: Endoscopy;  Laterality: N/A;  ? COLONOSCOPY WITH PROPOFOL N/A 01/02/2022  ? Procedure: COLONOSCOPY WITH PROPOFOL;  Surgeon: Lesly Rubenstein, MD;  Location: [facility] ENDOSCOPY;  Service: Endoscopy;  Laterality: N/A;  ? CORONARY ANGIOPLASTY WITH STENT PLACEMENT  11/2003  ? kidney stent    ? TEE WITHOUT CARDIOVERSION N/A 09/20/2021  ? Procedure: TRANSESOPHAGEAL ECHOCARDIOGRAM (TEE);  Surgeon: Teodoro Spray, MD;  Location: [facility] ORS;  Service: Cardiovascular;  Laterality: N/A;  ?  ? ?MEDICATIONS:  ?Prior to Admission medications   ?Medication Sig Start Date End Date Taking? Authorizing Provider  ?acetaminophen (TYLENOL) 500 MG tablet Take 1,000 mg by mouth every 6 (six) hours as needed for mild pain.   Yes [provider]  ?amiodarone (PACERONE) 200 MG tablet Take 1 tablet (200 mg total) by mouth daily. 07/26/21  Yes Wyvonnia Dusky, MD  ?aspirin 81 MG tablet Take 81 mg by mouth daily.    Yes [provider]  ?atorvastatin (LIPITOR) 10 MG tablet Take 10 mg by mouth daily. 05/18/15  Yes [provider]  ?benztropine (COGENTIN) 1  MG tablet Take 1 mg by mouth at bedtime.    Yes [provider]  ?brimonidine-timolol (COMBIGAN) 0.2-0.5 % ophthalmic solution Place 1 drop into both eyes every 12 (twelve) hours. Wait 3 to 5 minutes between drops   Yes [provider]  ?carvedilol (COREG) 12.5 MG tablet Take 12.5 mg by mouth in the morning and at bedtime.   Yes [provider]  ?citalopram (CELEXA) 20 MG tablet Take 20 mg by mouth daily.   Yes [provider]  ?diclofenac Sodium (VOLTAREN) 1 % GEL Apply 2 g  topically 4 (four) times daily as needed. Apply to neck and shoulders 08/18/21  Yes Jennye Boroughs, MD  ?divalproex (DEPAKOTE ER) 500 MG 24 hr tablet Take 2,000 mg by mouth at bedtime.    Yes [provider]  ?donepezil (ARICEPT) 10 MG tablet Take 10 mg by mouth at bedtime.    Yes [provider]  ?ELIQUIS 5 MG TABS tablet Take 5 mg by mouth 2 (two) times daily. 05/18/15  Yes [provider]  ?ferrous sulfate 325 (65 FE) MG tablet Take 325 mg by mouth daily with breakfast.   Yes [provider]  ?furosemide (LASIX) 20 MG tablet Take 20 mg by mouth daily as needed. For edema (swelling) 06/22/21  Yes [provider]  ?lanolin/mineral oil (KERI/THERA-DERM) LOTN Apply 1 application topically as needed for dry skin. Apply to face after washing face daily   Yes [provider]  ?Loratadine 10 MG CAPS Take by mouth daily.   Yes [provider]  ?LUMIGAN 0.01 % SOLN Place 1 drop into both eyes at bedtime. 07/06/21  Yes [provider]  ?magnesium oxide (MAG-OX) 400 MG tablet Take 400 mg by mouth daily.   Yes [provider]  ?melatonin 5 MG TABS Take 5 mg by mouth at bedtime.   Yes [provider]  ?Menthol, Topical Analgesic, (BIOFREEZE) 4 % GEL Apply 1 application topically 3 (three) times daily. To lower back area as needed for pain   Yes [provider]  ?metFORMIN (GLUCOPHAGE) 500 MG tablet Take 500 mg by mouth 2 (two) times daily with a meal.   Yes [provider]  ?Neomycin-Bacitracin-Polymyxin (TRIPLE ANTIBIOTIC) [id number] OINT  11/30/21  Yes [provider]  ?omeprazole (PRILOSEC) 20 MG capsule Take 20 mg by mouth 2 (two) times daily before a meal. May open and sprinkle in applesauce.   Yes [provider]  ?[place] (OYSTER CALCIUM) 500 MG TABS tablet Take 500 mg of elemental calcium by mouth 2 (two) times daily.   Yes [provider]  ?risperidone (RISPERDAL) 4 MG tablet Take 4  mg by mouth at bedtime. 05/18/15  Yes [provider]  ?sodium chloride (OCEAN) 0.65 % SOLN nasal spray Place 1 spray into both nostrils daily.   Yes [provider]  ?Cholecalciferol (EQL VITAMIN D3) 50 MCG (2000 UT) CAPS Take 1 capsule by mouth daily. ?Patient not taking: Reported on 02/12/2022    [provider]  ?DUREZOL 0.05 % EMUL  02/02/22   [provider]  ?ILEVRO 0.3 % ophthalmic suspension  02/02/22   [provider]  ?ketorolac (ACULAR) 0.5 % ophthalmic solution SMARTSIG:In Eye(s) ?Patient not taking: Reported on 02/12/2022 01/17/22   [provider]  ?  ? ?ALLERGIES:  ?Allergies  ?Allergen Reactions  ? Sulfa Antibiotics Other (See Comments)  ?  Unknown reaction  ? Xarelto [Rivaroxaban]   ?  ? ?SOCIAL HISTORY:  ?Social History  ? ?  Socioeconomic History  ? Marital status: Single  ?  Spouse name: Not on file  ? Number of children: Not on file  ? Years of education: Not on file  ? Highest education level: Not on file  ?Occupational History  ? Not on file  ?Tobacco Use  ? Smoking status: Former  ? Smokeless tobacco: Never  ?Vaping Use  ? Vaping Use: Never used  ?Substance and Sexual Activity  ? Alcohol use: No  ? Drug use: No  ? Sexual activity: Not on file  ?Other Topics Concern  ? Not on file  ?Social History Narrative  ? Not on file  ? ?Social Determinants of Health  ? ?Financial Resource Strain: Not on file  ?Food Insecurity: Not on file  ?Transportation Needs: Not on file  ?Physical Activity: Not on file  ?Stress: Not on file  ?Social Connections: Not on file  ?Intimate Partner Violence: Not on file  ?  ? ?FAMILY HISTORY:  ?Family History  ?Problem Relation Age of Onset  ? Diabetes Other   ? Bladder Cancer Neg Hx   ? Kidney cancer Neg Hx   ? Prostate cancer Neg Hx   ?  ? ? ?REVIEW OF SYSTEMS:  ?Review of Systems  ?Unable to perform ROS: Psychiatric disorder  ?Skin:   ?     + Left Posterior Thigh Abscess   ? ?VITAL SIGNS:  ?Temp:  [97.8 ?F (36.6 ?C)-98.6 ?F  (37 ?C)] 97.8 ?F (36.6 ?C) (03/28 1005) ?Pulse Rate:  [id number] (03/28 1005) ?Resp:  [17-20] 20 (03/28 1005) ?BP: (111-126)/(67-88) 119/86 (03/28 1005) ?SpO2:  [95 %-100 %] 97 % (03/28 1005) ?Weight:  [id number]

## 2022-02-13 NOTE — Progress Notes (Addendum)
?Progress Note ? ? ? ?Brian Dodson   ?[id number]  ?DOB: Apr 28, 1956  ?DOA: 02/12/2022     1 ?PCP: Brian Nasuti, MD ? ?Initial CC: leg wound ? ?Hospital Course: ?Brian Dodson is a 66 year old male with PMH A-fib, CAD, COPD, DM II, GERD, HLD, HTN, schizophrenia, vascular dementia, CKD 2 who presented with a fever and ongoing wound involving his posterior left thigh.  He was unable to provide much collateral information on admission due to his underlying cognitive deficits. ?He underwent bedside I&D in the ER and was started on vancomycin/Rocephin and admitted for further work-up. ? ?Interval History:  ?Seen this morning in the ER.  He was resting comfortably in stretcher.  Pain improved in his left thigh after drainage.  ? ?Assessment and Plan: ?* Cellulitis and abscess of left leg ?- S/p I&D in the ED on 3/27 ?- started on Rocephin and Vanc on admission ?- ongoing draining wound this am, concern for further abscess deeper; surgery consult requested to help evaluate ?- CT left thigh obtained shows severe tissue edema but no drainable fluid collection remaining ?- continue further surgery recommendations: pack wound with iodoform packing strips to ensure closure from inside out ?-Follow-up wound culture obtained 02/13/2022 ? ?Acute renal failure superimposed on stage 2 chronic kidney disease (HCC) ?- likely has component of CKD2 due to underlying DMII and varying renal function over time ?- - patient has history of CKD2. Baseline creat ~ 1 - 1.1, eGFR 60-90 ?- patient presents with increase in creat >0.3 mg/dL above baseline, creat increase >1.5x baseline presumed to have occurred within past 7 days PTA ?-Creatinine 1.5 on admission ?-Presumed prerenal/volume depletion on admission ?- Responded well to fluids ?- Renal function back to baseline ?-Continue diet ? ?Anemia of chronic disease ?- Baseline hemoglobin approximately 9 to 10 g/dL ?-Currently at baseline ? ?PAF (paroxysmal atrial fibrillation) ([place]) ?- Continue  amiodarone and Eliquis ?- Continue Coreg ? ?Dementia ([place]) ?Continue donepezil and Celexa ?Delirium precautions ? ?CAD (coronary artery disease) ?- Continue aspirin, atorvastatin and carvedilol ? ?HTN (hypertension) ?- Continue carvedilol ? ?Diabetes mellitus, type II ([place]) ?- Continue SSI and CBG monitoring ? ?Schizophrenia ([place]) ?Stable, without acute issues.  Continue Risperdal, Depakote and Cogentin ? ? ?Old records reviewed in assessment of this patient ? ?Antimicrobials: ?Vancomycin 02/12/2022 >> current ?Rocephin 02/12/2022 >> current ? ?DVT prophylaxis:  ? ?apixaban (ELIQUIS) tablet 5 mg  ? ?Code Status:   Code Status: Full Code ? ?Disposition Plan: Home in 2 to 3 days ?Status is: Inpatient ? ?Objective: ?Blood pressure 121/82, pulse (!) 111, temperature 98.2 ?F (36.8 ?C), temperature source Oral, resp. rate 18, height 6' (1.829 m), weight 97.5 kg, SpO2 99 %.  ?Examination:  ?Physical Exam ?Constitutional:   ?   General: He is not in acute distress. ?   Appearance: Normal appearance.  ?HENT:  ?   Head: Normocephalic and atraumatic.  ?   Mouth/Throat:  ?   Mouth: Mucous membranes are moist.  ?Eyes:  ?   Extraocular Movements: Extraocular movements intact.  ?Cardiovascular:  ?   Rate and Rhythm: Normal rate and regular rhythm.  ?   Heart sounds: Normal heart sounds.  ?Pulmonary:  ?   Effort: Pulmonary effort is normal. No respiratory distress.  ?   Breath sounds: Normal breath sounds. No wheezing.  ?Abdominal:  ?   General: Bowel sounds are normal. There is no distension.  ?   Palpations: Abdomen is soft.  ?   Tenderness: There is  no abdominal tenderness.  ?Musculoskeletal:  ?   Cervical back: Normal range of motion and neck supple.  ?   Comments: Left posterior thigh is noted with 2 to 3 cm in diameter wound with some drainage still appreciated and surrounding induration.  There is some erythema.  No palpable fluctuance.  Compartments soft.  ?Skin: ?   General: Skin is warm and dry.  ?Neurological:  ?    General: No focal deficit present.  ?   Mental Status: He is alert.  ?Psychiatric:     ?   Mood and Affect: Mood normal.     ?   Behavior: Behavior normal.  ?  ? ?Consultants:  ?General surgery ? ?Procedures:  ? ? ?Data Reviewed: ?Results for orders placed or performed during the hospital encounter of 02/12/22 (from the past 24 hour(s))  ?CBC with Differential     Status: Abnormal  ? Collection Time: 02/12/22  8:13 PM  ?Result Value Ref Range  ? WBC 14.0 (H) 4.0 - 10.5 K/uL  ? RBC 3.15 (L) 4.22 - 5.81 MIL/uL  ? Hemoglobin 9.9 (L) 13.0 - 17.0 g/dL  ? HCT 31.6 (L) 39.0 - 52.0 %  ? MCV 100.3 (H) 80.0 - 100.0 fL  ? MCH 31.4 26.0 - 34.0 pg  ? MCHC 31.3 30.0 - 36.0 g/dL  ? RDW 14.8 11.5 - 15.5 %  ? Platelets 177 150 - 400 K/uL  ? nRBC 0.0 0.0 - 0.2 %  ? Neutrophils Relative % 74 %  ? Neutro Abs 10.3 (H) 1.7 - 7.7 K/uL  ? Lymphocytes Relative 16 %  ? Lymphs Abs 2.2 0.7 - 4.0 K/uL  ? Monocytes Relative 9 %  ? Monocytes Absolute 1.3 (H) 0.1 - 1.0 K/uL  ? Eosinophils Relative 0 %  ? Eosinophils Absolute 0.0 0.0 - 0.5 K/uL  ? Basophils Relative 0 %  ? Basophils Absolute 0.0 0.0 - 0.1 K/uL  ? Immature Granulocytes 1 %  ? Abs Immature Granulocytes 0.08 (H) 0.00 - 0.07 K/uL  ?Basic metabolic panel     Status: Abnormal  ? Collection Time: 02/12/22  8:13 PM  ?Result Value Ref Range  ? Sodium 136 135 - 145 mmol/L  ? Potassium 4.5 3.5 - 5.1 mmol/L  ? Chloride 103 98 - 111 mmol/L  ? CO2 26 22 - 32 mmol/L  ? Glucose, Bld 115 (H) 70 - 99 mg/dL  ? BUN 40 (H) 8 - 23 mg/dL  ? Creatinine, Ser 1.51 (H) 0.61 - 1.24 mg/dL  ? Calcium 8.8 (L) 8.9 - 10.3 mg/dL  ? GFR, Estimated 51 (L) >60 mL/min  ? Anion gap 7 5 - 15  ?CBG monitoring, ED     Status: Abnormal  ? Collection Time: 02/13/22 12:18 AM  ?Result Value Ref Range  ? Glucose-Capillary 169 (H) 70 - 99 mg/dL  ?Basic metabolic panel     Status: Abnormal  ? Collection Time: 02/13/22  4:40 AM  ?Result Value Ref Range  ? Sodium 137 135 - 145 mmol/L  ? Potassium 4.3 3.5 - 5.1 mmol/L  ? Chloride  105 98 - 111 mmol/L  ? CO2 24 22 - 32 mmol/L  ? Glucose, Bld 106 (H) 70 - 99 mg/dL  ? BUN 31 (H) 8 - 23 mg/dL  ? Creatinine, Ser 1.18 0.61 - 1.24 mg/dL  ? Calcium 8.4 (L) 8.9 - 10.3 mg/dL  ? GFR, Estimated >60 >60 mL/min  ? Anion gap 8 5 - 15  ?CBC  Status: Abnormal  ? Collection Time: 02/13/22  4:40 AM  ?Result Value Ref Range  ? WBC 15.2 (H) 4.0 - 10.5 K/uL  ? RBC 3.00 (L) 4.22 - 5.81 MIL/uL  ? Hemoglobin 9.5 (L) 13.0 - 17.0 g/dL  ? HCT 30.8 (L) 39.0 - 52.0 %  ? MCV 102.7 (H) 80.0 - 100.0 fL  ? MCH 31.7 26.0 - 34.0 pg  ? MCHC 30.8 30.0 - 36.0 g/dL  ? RDW 14.8 11.5 - 15.5 %  ? Platelets 159 150 - 400 K/uL  ? nRBC 0.0 0.0 - 0.2 %  ?CBG monitoring, ED     Status: None  ? Collection Time: 02/13/22  8:15 AM  ?Result Value Ref Range  ? Glucose-Capillary 74 70 - 99 mg/dL  ?CBG monitoring, ED     Status: Abnormal  ? Collection Time: 02/13/22 11:27 AM  ?Result Value Ref Range  ? Glucose-Capillary 132 (H) 70 - 99 mg/dL  ?  ?I have Reviewed nursing notes, Vitals, and Lab results since pt's last encounter. Pertinent lab results : see above ?I have ordered test including BMP, CBC, Mg ?I have reviewed the last note from staff over past 24 hours ?I have discussed pt's care plan and test results with nursing staff, case manager ? ? LOS: 1 day  ? ?Dwyane Dee, MD ?[facility] ?02/13/2022, 3:16 PM ? ?

## 2022-02-13 NOTE — Hospital Course (Addendum)
Mr. Wiler is a 66 year old male with PMH A-fib, CAD, COPD, DM II, GERD, HLD, HTN, schizophrenia, vascular dementia, CKD 2 who presented with a fever and ongoing wound involving his posterior left thigh.  He was unable to provide much collateral information on admission due to his underlying cognitive deficits. ?He underwent bedside I&D in the ER and was started on vancomycin/Rocephin and admitted for further work-up. ?He was evaluated by surgery and underwent CT thigh and did not have any further evidence of abscess nor warrant any further I&D.  ?He was recommended for ongoing packing of abscess cavity at discharge. Wound culture grew MRSA and his antibiotics were de-escalated to doxycyline at discharge to complete his course.  ?

## 2022-02-13 NOTE — Progress Notes (Signed)
Pharmacy Antibiotic Note ? ?Brian Dodson is a 66 y.o. male admitted on 02/12/2022 with cellulitis.  Pharmacy has been consulted for Vancomycin dosing. ? ?02/13/22: ?Scr improving 1.51 > 1.18 ? ?Plan: ?Adjust Vancomycin to 2000 mg IV Q24H  ?New estimated: ?AUC = 470 ?Vanc trough = 9.8 ? ?Height: 6' (182.9 cm) ?Weight: 97.5 kg (215 lb) ?IBW/kg (Calculated) : 77.6 ? ?Temp (24hrs), Avg:98.6 ?F (37 ?C), Min:98.6 ?F (37 ?C), Max:98.6 ?F (37 ?C) ? ?Recent Labs  ?Lab 02/12/22 ?2013 02/13/22 ?0440  ?WBC 14.0* 15.2*  ?CREATININE 1.51* 1.18  ? ?  ?Estimated Creatinine Clearance: 74.6 mL/min (by C-G formula based on SCr of 1.18 mg/dL).   ? ?Allergies  ?Allergen Reactions  ? Sulfa Antibiotics Other (See Comments)  ?  Unknown reaction  ? Xarelto [Rivaroxaban]   ? ? ?Antimicrobials this admission: ?3/28 Vancomycin  >>  ? 3/28 Ceftriaxone  >>  ? ?Dose adjustments this admission: ?3/28: Adjust Vancomycin to 2000 mg IV Q24H  ? ?Microbiology results: ? BCx:  ? UCx:   ? Sputum:   ? MRSA PCR:  ? ?Thank you for allowing pharmacy to be a part of this patientBrians care. ? ?Sharen Hones, PharmD, BCPS ?Clinical Pharmacist   ?02/13/2022 7:14 AM ? ?

## 2022-02-13 NOTE — Assessment & Plan Note (Signed)
-   Continue amiodarone and Eliquis -Continue Coreg 

## 2022-02-13 NOTE — Progress Notes (Addendum)
Admission profile updated. ?

## 2022-02-14 DIAGNOSIS — L03116 Cellulitis of left lower limb: Secondary | ICD-10-CM | POA: Diagnosis not present

## 2022-02-14 DIAGNOSIS — L02416 Cutaneous abscess of left lower limb: Secondary | ICD-10-CM | POA: Diagnosis not present

## 2022-02-14 LAB — BASIC METABOLIC PANEL WITH GFR
Anion gap: 5 (ref 5–15)
BUN: 18 mg/dL (ref 8–23)
CO2: 28 mmol/L (ref 22–32)
Calcium: 8.3 mg/dL — ABNORMAL LOW (ref 8.9–10.3)
Chloride: 103 mmol/L (ref 98–111)
Creatinine, Ser: 1.03 mg/dL (ref 0.61–1.24)
GFR, Estimated: >60 mL/min
Glucose, Bld: 94 mg/dL (ref 70–99)
Potassium: 4.6 mmol/L (ref 3.5–5.1)
Sodium: 136 mmol/L (ref 135–145)

## 2022-02-14 LAB — CBC WITH DIFFERENTIAL/PLATELET
Abs Immature Granulocytes: 0.09 10*3/uL — ABNORMAL HIGH (ref 0.00–0.07)
Basophils Absolute: 0 10*3/uL (ref 0.0–0.1)
Basophils Relative: 0 %
Eosinophils Absolute: 0.3 10*3/uL (ref 0.0–0.5)
Eosinophils Relative: 3 %
HCT: 30.1 % — ABNORMAL LOW (ref 39.0–52.0)
Hemoglobin: 9.5 g/dL — ABNORMAL LOW (ref 13.0–17.0)
Immature Granulocytes: 1 %
Lymphocytes Relative: 26 %
Lymphs Abs: 2.7 10*3/uL (ref 0.7–4.0)
MCH: 31.3 pg (ref 26.0–34.0)
MCHC: 31.6 g/dL (ref 30.0–36.0)
MCV: 99 fL (ref 80.0–100.0)
Monocytes Absolute: 1.2 10*3/uL — ABNORMAL HIGH (ref 0.1–1.0)
Monocytes Relative: 11 %
Neutro Abs: 6 10*3/uL (ref 1.7–7.7)
Neutrophils Relative %: 59 %
Platelets: 175 10*3/uL (ref 150–400)
RBC: 3.04 MIL/uL — ABNORMAL LOW (ref 4.22–5.81)
RDW: 14.6 % (ref 11.5–15.5)
WBC: 10.2 10*3/uL (ref 4.0–10.5)
nRBC: 0 % (ref 0.0–0.2)

## 2022-02-14 LAB — MAGNESIUM: Magnesium: 1.9 mg/dL (ref 1.7–2.4)

## 2022-02-14 LAB — GLUCOSE, CAPILLARY
Glucose-Capillary: 98 mg/dL (ref 70–99)
Glucose-Capillary: 159 mg/dL — ABNORMAL HIGH (ref 70–99)
Glucose-Capillary: 101 mg/dL — ABNORMAL HIGH (ref 70–99)
Glucose-Capillary: 159 mg/dL — ABNORMAL HIGH (ref 70–99)

## 2022-02-14 MED ORDER — VANCOMYCIN HCL 1250 MG/250ML IV SOLN
1250.0000 mg | Freq: Two times a day (BID) | INTRAVENOUS | Status: DC
  Administered 2022-02-15: 1250 mg via INTRAVENOUS
  Filled 2022-02-14 (×2): qty 250

## 2022-02-14 MED ORDER — BRIMONIDINE TARTRATE 0.2 % OP SOLN
1.0000 [drp] | Freq: Two times a day (BID) | OPHTHALMIC | Status: DC
  Administered 2022-02-14 – 2022-02-15 (×3): 1 [drp] via OPHTHALMIC
  Filled 2022-02-14: qty 5

## 2022-02-14 MED ORDER — TIMOLOL MALEATE 0.5 % OP SOLN
1.0000 [drp] | Freq: Two times a day (BID) | OPHTHALMIC | Status: DC
  Administered 2022-02-14 – 2022-02-15 (×3): 1 [drp] via OPHTHALMIC
  Filled 2022-02-14: qty 5

## 2022-02-14 NOTE — Progress Notes (Signed)
Mobility Specialist - Progress Note ? ? 02/14/22 1300  ?Mobility  ?Activity Ambulated with assistance in hallway  ?Level of Assistance Standby assist, set-up cues, supervision of patient - no hands on  ?Assistive Device Front wheel walker  ?Distance Ambulated (ft) 360 ft  ?Activity Response Tolerated well  ?$Mobility charge 1 Mobility  ? ? ? ?Pt lying in bed upon arrival, utilizing RA. Pt AO to self, location, and month but not year; pleasantly confused. A little impulsive. Ambulated in hallway with supervision; vc to keep RW within BOS. Upon return to room, pt voices need to void and pulls off external catheter. Mobility cleaned floors for safe return distance. Pt returned to bed with alarm set, lunch tray set up in front of pt.  ? ? ?Brian Dodson ?Mobility Specialist ?02/14/22, 1:21 PM ? ?

## 2022-02-14 NOTE — Progress Notes (Signed)
?Progress Note ? ? ? ?Brian Dodson   ?WUJ:[id number]  ?DOB: 09-21-1956  ?DOA: 02/12/2022     2 ?PCP: [name], MD ? ?Initial CC: leg wound ? ?Hospital Course: ?[name] is a 66 year old male with PMH A-fib, CAD, COPD, DM II, GERD, HLD, HTN, schizophrenia, vascular dementia, CKD 2 who presented with a fever and ongoing wound involving his posterior left thigh.  He was unable to provide much collateral information on admission due to his underlying cognitive deficits. ?He underwent bedside I&D in the ER and was started on vancomycin/Rocephin and admitted for further work-up. ? ?Interval History:  ?No events overnight.  Much more comfortable today.  Pain has improved.  Tentative plan for discharge tomorrow.  Home health has been ordered and ALF made aware as well. ? ?Assessment and Plan: ?* Cellulitis and abscess of left leg ?- S/p I&D in the ED on 3/27 ?- started on Rocephin and Vanc on admission ?- CT left thigh obtained shows severe tissue edema but no drainable fluid collection remaining ?- appreciate surgery recommendations: pack wound with iodoform packing strips to ensure closure from inside out ?- needs daily packing/dressing changes; ALF reports should be able to honor this at discharge ?- wound culture noted now with Staph; d/c CTX and continue Vanc and await further speciation and sens ?-Home health also ordered for discharge to aid with recovery ? ?Acute renal failure superimposed on stage 2 chronic kidney disease (HCC) ?- likely has component of CKD2 due to underlying DMII and varying renal function over time ?- patient has history of CKD2. Baseline creat ~ 1 - 1.1, eGFR 60-90 ?- patient presents with increase in creat >0.3 mg/dL above baseline, creat increase >1.5x baseline presumed to have occurred within past 7 days PTA ?-Creatinine 1.5 on admission ?-Presumed prerenal/volume depletion on admission ?- Responded well to fluids ?- Renal function back to baseline ?-Continue diet ? ?Anemia of chronic  disease ?- Baseline hemoglobin approximately 9 to 10 g/dL ?-Currently at baseline ? ?PAF (paroxysmal atrial fibrillation) ([place]) ?- Continue amiodarone and Eliquis ?- Continue Coreg ? ?Dementia ([place]) ?Continue donepezil and Celexa ?Delirium precautions ? ?CAD (coronary artery disease) ?- Continue aspirin, atorvastatin and carvedilol ? ?HTN (hypertension) ?- Continue carvedilol ? ?Diabetes mellitus, type II ([place]) ?- Continue SSI and CBG monitoring ? ?Schizophrenia ([place]) ?Stable, without acute issues.  Continue Risperdal, Depakote and Cogentin ? ? ?Old records reviewed in assessment of this patient ? ?Antimicrobials: ?Vancomycin 02/12/2022 >> current ?Rocephin 02/12/2022 >> 02/14/2022 ? ?DVT prophylaxis:  ? ?apixaban (ELIQUIS) tablet 5 mg  ? ?Code Status:   Code Status: Full Code ? ?Disposition Plan: Back to ALF on Thursday  ?Status is: Inpatient ? ?Objective: ?Blood pressure 104/82, pulse 80, temperature 98.4 ?F (36.9 ?C), temperature source Oral, resp. rate 18, height 6' (1.829 m), weight 97.5 kg, SpO2 99 %.  ?Examination:  ?Physical Exam ?Constitutional:   ?   General: He is not in acute distress. ?   Appearance: Normal appearance.  ?HENT:  ?   Head: Normocephalic and atraumatic.  ?   Mouth/Throat:  ?   Mouth: Mucous membranes are moist.  ?Eyes:  ?   Extraocular Movements: Extraocular movements intact.  ?Cardiovascular:  ?   Rate and Rhythm: Normal rate and regular rhythm.  ?   Heart sounds: Normal heart sounds.  ?Pulmonary:  ?   Effort: Pulmonary effort is normal. No respiratory distress.  ?   Breath sounds: Normal breath sounds. No wheezing.  ?Abdominal:  ?  General: Bowel sounds are normal. There is no distension.  ?   Palpations: Abdomen is soft.  ?   Tenderness: There is no abdominal tenderness.  ?Musculoskeletal:  ?   Cervical back: Normal range of motion and neck supple.  ?   Comments: Left posterior thigh is noted with packing in place and still with surrounding induration but area is overall less erythematous  and less TTP  ?Skin: ?   General: Skin is warm and dry.  ?Neurological:  ?   General: No focal deficit present.  ?   Mental Status: He is alert.  ?Psychiatric:     ?   Mood and Affect: Mood normal.     ?   Behavior: Behavior normal.  ?  ? ?Consultants:  ?General surgery ? ?Procedures:  ? ? ?Data Reviewed: ?Results for orders placed or performed during the hospital encounter of 02/12/22 (from the past 24 hour(s))  ?Glucose, capillary     Status: Abnormal  ? Collection Time: 02/13/22  9:00 PM  ?Result Value Ref Range  ? Glucose-Capillary 128 (H) 70 - 99 mg/dL  ?Basic metabolic panel     Status: Abnormal  ? Collection Time: 02/14/22  3:37 AM  ?Result Value Ref Range  ? Sodium 136 135 - 145 mmol/L  ? Potassium 4.6 3.5 - 5.1 mmol/L  ? Chloride 103 98 - 111 mmol/L  ? CO2 28 22 - 32 mmol/L  ? Glucose, Bld 94 70 - 99 mg/dL  ? BUN 18 8 - 23 mg/dL  ? Creatinine, Ser 1.03 0.61 - 1.24 mg/dL  ? Calcium 8.3 (L) 8.9 - 10.3 mg/dL  ? GFR, Estimated >60 >60 mL/min  ? Anion gap 5 5 - 15  ?CBC with Differential/Platelet     Status: Abnormal  ? Collection Time: 02/14/22  3:37 AM  ?Result Value Ref Range  ? WBC 10.2 4.0 - 10.5 K/uL  ? RBC 3.04 (L) 4.22 - 5.81 MIL/uL  ? Hemoglobin 9.5 (L) 13.0 - 17.0 g/dL  ? HCT 30.1 (L) 39.0 - 52.0 %  ? MCV 99.0 80.0 - 100.0 fL  ? MCH 31.3 26.0 - 34.0 pg  ? MCHC 31.6 30.0 - 36.0 g/dL  ? RDW 14.6 11.5 - 15.5 %  ? Platelets 175 150 - 400 K/uL  ? nRBC 0.0 0.0 - 0.2 %  ? Neutrophils Relative % 59 %  ? Neutro Abs 6.0 1.7 - 7.7 K/uL  ? Lymphocytes Relative 26 %  ? Lymphs Abs 2.7 0.7 - 4.0 K/uL  ? Monocytes Relative 11 %  ? Monocytes Absolute 1.2 (H) 0.1 - 1.0 K/uL  ? Eosinophils Relative 3 %  ? Eosinophils Absolute 0.3 0.0 - 0.5 K/uL  ? Basophils Relative 0 %  ? Basophils Absolute 0.0 0.0 - 0.1 K/uL  ? Immature Granulocytes 1 %  ? Abs Immature Granulocytes 0.09 (H) 0.00 - 0.07 K/uL  ?Magnesium     Status: None  ? Collection Time: 02/14/22  3:37 AM  ?Result Value Ref Range  ? Magnesium 1.9 1.7 - 2.4 mg/dL   ?Glucose, capillary     Status: None  ? Collection Time: 02/14/22  8:08 AM  ?Result Value Ref Range  ? Glucose-Capillary 98 70 - 99 mg/dL  ? Comment 1 Notify RN   ? Comment 2 Document in Chart   ?Glucose, capillary     Status: Abnormal  ? Collection Time: 02/14/22 11:55 AM  ?Result Value Ref Range  ? Glucose-Capillary 159 (H) 70 - 99 mg/dL  ?  Glucose, capillary     Status: Abnormal  ? Collection Time: 02/14/22  4:32 PM  ?Result Value Ref Range  ? Glucose-Capillary 101 (H) 70 - 99 mg/dL  ?  ?I have Reviewed nursing notes, Vitals, and Lab results since pt's last encounter. Pertinent lab results : see above ?I have ordered test including BMP, CBC, Mg ?I have reviewed the last note from staff over past 24 hours ?I have discussed pt's care plan and test results with nursing staff, case manager ? ? LOS: 2 days  ? ?Dwyane Dee, MD ?[facility] ?02/14/2022, 4:51 PM ? ?

## 2022-02-14 NOTE — Progress Notes (Signed)
Pharmacy Antibiotic Note ? ?Brian Dodson is a 66 y.o. male admitted on 02/12/2022 with cellulitis.  Pharmacy has been consulted for Vancomycin dosing. ? ?02/14/22: ?Scr improving 1.51 > 1.18 > 1.03 ? ?Plan: ?Adjust Vancomycin to 1250 mg IV Q12H  ?New estimated: AUC = 518 ?Used: Scr 1.03, IBW, Vd 0.72 ?Monitor renal function and adjust dose as clinically indicated ?Follow up cultures ? ? ?Height: 6' (182.9 cm) ?Weight: 97.5 kg (215 lb) ?IBW/kg (Calculated) : 77.6 ? ?Temp (24hrs), Avg:98.3 ?F (36.8 ?C), Min:97.8 ?F (36.6 ?C), Max:99.2 ?F (37.3 ?C) ? ?Recent Labs  ?Lab 02/12/22 ?2013 02/13/22 ?4496 02/14/22 ?7591  ?WBC 14.0* 15.2* 10.2  ?CREATININE 1.51* 1.18 1.03  ? ?  ?Estimated Creatinine Clearance: 85.4 mL/min (by C-G formula based on SCr of 1.03 mg/dL).   ? ?Allergies  ?Allergen Reactions  ? Sulfa Antibiotics Other (See Comments)  ?  Unknown reaction  ? Xarelto [Rivaroxaban]   ? ? ?Antimicrobials this admission: ?3/28 Vancomycin  >>  ? 3/28 Ceftriaxone  >>  ? ?Dose adjustments this admission: ?3/28: Adjust Vancomycin to 2000 mg IV Q24H  ?3/28: Adjust Vancomycin to 1250 mg IV Q12H ? ?Microbiology results: ?3/28 Wound: GPC ? ?Thank you for allowing pharmacy to be a part of this patient?s care. ? ?Raiford Noble, PharmD, BCPS ?Clinical Pharmacist   ?02/14/2022 8:43 AM ? ?

## 2022-02-14 NOTE — Progress Notes (Signed)
[facility] ?SURGICAL PROGRESS NOTE (cpt [phone]) ? ?Hospital Day(s): 2.  ? ?Interval History: Patient seen and examined, no acute events or new complaints overnight. Patient reports he is feeling well. He is unable to provide much reliable history secondary to psychiatric history. No reports of LLQ pain. Leukocytosis is now resolved at 10.2K. No fever. Cx from yesterday growing gram + cocci. CT without drainable collection or necrotizing infection. He continues on rocephin and vancomycin.  ? ?Review of Systems:  ?Unable to reliably preform secondary to psychiatric history.  ? ? ?Vital signs in last 24 hours: [min-max] current  ?Temp:  [97.8 ?F (36.6 ?C)-99.2 ?F (37.3 ?C)] 99.2 ?F (37.3 ?C) (03/29 0347) ?Pulse Rate:  [58-111] 87 (03/29 0347) ?Resp:  [16-20] 20 (03/29 0347) ?BP: (111-133)/(77-92) 111/77 (03/29 0347) ?SpO2:  [97 %-100 %] 100 % (03/29 0347)     Height: 6' (182.9 cm) Weight: 97.5 kg BMI (Calculated): 29.15  ? ?Intake/Output last 2 shifts:  ?03/28 0701 - 03/29 0700 ?In: 618 [P.O.:118; IV Piggyback:500] ?Out: 0   ? ?Physical Exam:  ?Constitutional: alert, cooperative and no distress  ?HENT: normocephalic without obvious abnormality  ?Eyes: PERRL, EOM's grossly intact and symmetric  ?Respiratory: breathing non-labored at rest  ?Cardiovascular: regular rate and sinus rhythm  ?Integumentary: I&D to the left posterior thigh, iodoform packing in place, no drainage this morning, surrounding tissue is indurated. No fluctuance or evidence of necrotizing infection.  ? ? ?Labs:  ? ?  Latest Ref Rng & Units 02/14/2022  ?  3:37 AM 02/13/2022  ?  4:40 AM 02/12/2022  ?  8:13 PM  ?CBC  ?WBC 4.0 - 10.5 K/uL 10.2   15.2   14.0    ?Hemoglobin 13.0 - 17.0 g/dL 9.5   9.5   9.9    ?Hematocrit 39.0 - 52.0 % 30.1   30.8   31.6    ?Platelets 150 - 400 K/uL 175   159   177    ? ? ?  Latest Ref Rng & Units 02/14/2022  ?  3:37 AM 02/13/2022  ?  4:40 AM 02/12/2022  ?  8:13 PM  ?CMP  ?Glucose 70 - 99 mg/dL 94   106   115     ?BUN 8 - 23 mg/dL 18   31   40    ?Creatinine 0.61 - 1.24 mg/dL 1.03   1.18   1.51    ?Sodium 135 - 145 mmol/L 136   137   136    ?Potassium 3.5 - 5.1 mmol/L 4.6   4.3   4.5    ?Chloride 98 - 111 mmol/L 103   105   103    ?CO2 22 - 32 mmol/L 28   24   26     ?Calcium 8.9 - 10.3 mg/dL 8.3   8.4   8.8    ? ? ? ?Imaging studies:  ? ?CT Left Femur (02/12/2022) personally reviewed without evidence of undrained collections or necrotizing infection, and radiologist report reviewed below:  ?IMPRESSION: ?1. Severe soft tissue edema in the subcutaneous fat along the medial ?aspect and posterior aspect of the left thigh concerning for ?cellulitis. Small soft tissue wound along the posteromedial aspect ?of the left thigh. No drainable fluid collection to suggest an ?abscess. ? ?Assessment/Plan: (ICD-10's: L02.416) ?66 y.o. male with leukocytosis found to have left posterior thigh abscess s/p I&D by EDP, complicated by pertinent comorbidities including psychiatric  condition. ?  ?           -  Continue wound care; Pack with iodoform packing strips to ensure this does not close early  ?           - Continue IV Abx (vancomycin, rocephin); follow up Cx results and narrow ?           - Pain control prn ?           - Further management per primary service; we will sign off but be available should needs change ?  ?All of the above findings and recommendations were discussed with the medical team.  ? ?-- ?Edison Simon, PA-C ?[facility] ?02/14/2022, 7:20 AM ?[phone] ?M-F: 7am - 4pm ? ?

## 2022-02-14 NOTE — Progress Notes (Signed)
Mobility Specialist - Progress Note ? ? 02/14/22 1627  ?Mobility  ?Activity Ambulated with assistance to bathroom;Stood at bedside  ?Level of Assistance Standby assist, set-up cues, supervision of patient - no hands on  ?Assistive Device Front wheel walker  ?Distance Ambulated (ft) 20 ft  ?Activity Response Tolerated well  ?$Mobility charge 1 Mobility  ? ? ?Pt ambulated to bathroom then stood at sink for bathing and ADL tasks. Pt left in bed with alarm set, needs in reach.  ? ? ?Filiberto Pinks ?Mobility Specialist ?02/14/22, 4:29 PM ? ?

## 2022-02-14 NOTE — TOC Initial Note (Addendum)
Transition of Care (TOC) - Initial/Assessment Note  ? ? ?Patient Details  ?Name: Brian Dodson ?MRN: [id number] ?Date of Birth: 12-25-1955 ? ?Transition of Care (TOC) CM/SW Contact:    ?Beverly Sessions, RN ?Phone Number: ?02/14/2022, 1:11 PM ? ?Clinical Narrative:                 ? ?Admitted [id number] ?Admitted from:[facility] ?[id number] ?Current home health/prior home health/DME:No DME ? ? ?Patient will require wound care at discharge ?Anticipated DC tomorrow. Tammy with [facility] can come tomorrow at 2pm for wound care education and transport.  Per Tammy Their preference for home health is [facility]. Referral made  and [facility] to [facility] with [facility]  ?Barrie Lyme with APS updated via email.  Awaiting response to confirm they also do not have a preference of home health agency.  ?Expected Discharge Plan: Assisted Living ?Barriers to Discharge: Continued Medical Work up ? ? ?Patient Goals and CMS Choice ?  ?  ?Choice offered to / list presented to : [facility] POA / Guardian ? ?Expected Discharge Plan and Services ?Expected Discharge Plan: Assisted Living ?  ?Discharge Planning Services: CM Consult ?Post Acute Care Choice: Home Health ?Living arrangements for the past 2 months: [place] ?                ?  ?  ?  ?  ?  ?HH Arranged: RN, PT, OT ?[place] Agency: [facility] ?Date HH Agency Contacted: 02/14/22 ?  ?Representative spoke with at [place]: [place] ? ?Prior Living Arrangements/Services ?Living arrangements for the past 2 months: [place] ?Lives with:: Facility Resident ?Patient language and need for interpreter reviewed:: Yes ?       ?Need for Family Participation in Patient Care: Yes (Comment) ?Care giver support system in place?: Yes (comment) ?  ?Criminal Activity/Legal Involvement Pertinent to Current Situation/Hospitalization: No - Comment as needed ? ?Activities of Daily Living ?Home Assistive Devices/Equipment: Gilford Rile (specify type) ?ADL Screening (condition at time of  admission) ?Patient's cognitive ability adequate to safely complete daily activities?: Yes ?Is the patient deaf or have difficulty hearing?: No ?Does the patient have difficulty seeing, even when wearing glasses/contacts?: No ?Does the patient have difficulty concentrating, remembering, or making decisions?: Yes ?Patient able to express need for assistance with ADLs?: Yes ?Does the patient have difficulty dressing or bathing?: No ?Independently performs ADLs?: Yes (appropriate for developmental age) ?Does the patient have difficulty walking or climbing stairs?: Yes ?Weakness of Legs: Both ?Weakness of Arms/Hands: None ? ?Permission Sought/Granted ?  ?  ?   ?   ?   ?   ? ?Emotional Assessment ?  ?  ?  ?  ?Alcohol / Substance Use: Not Applicable ?Psych Involvement: No (comment) ? ?Admission diagnosis:  Cellulitis of left lower extremity [L03.116] ?Left leg cellulitis [id number] ?Abscess of left thigh [L02.416] ?Patient Active Problem List  ? Diagnosis Date Noted  ? Cellulitis and abscess of left leg 02/12/2022  ? Streptococcal bacteremia 09/18/2021  ? Acute renal failure superimposed on stage 2 chronic kidney disease ([place]) 09/17/2021  ? Sepsis secondary to UTI ([place]) 03/05/2021  ? Acute metabolic encephalopathy [id number]  ? Dementia ([place]) 03/05/2021  ? Memory loss due to medical condition 03/05/2021  ? PAF (paroxysmal atrial fibrillation) ([place]) 03/05/2021  ? Atrial flutter ([place]) 03/05/2021  ? Diabetes mellitus type 2, controlled, without complications ([place]) [id number]  ? Obese 03/05/2021  ? Anemia of chronic disease 03/04/2021  ? Acute lower UTI 03/02/2021  ?  Acute febrile illness   ? Bilateral lower extremity edema 04/01/2018  ? Lower extremity pain, bilateral 04/01/2018  ? Swelling of limb 10/04/2017  ? Sepsis ([place]) 05/20/2017  ? Anemia 05/07/2017  ? B12 deficiency 05/07/2017  ? Loss of memory 05/07/2017  ? Atrial fibrillation with RVR ([place]) 05/23/2015  ? Syncope 05/23/2015  ? Schizophrenia ([place]) 05/23/2015  ?  Diabetes mellitus, type II ([place]) 05/23/2015  ? HTN (hypertension) 05/23/2015  ? GERD (gastroesophageal reflux disease) 05/23/2015  ? CAD (coronary artery disease) 05/23/2015  ? ?PCP:  [name], MD ?Pharmacy:   ?[facility] - [place], [place] - [address] ?[address] ?[address] ?[place] [address] ?Phone: [phone] Fax: [phone] ? ?[facility] - [place], [place] ?[place] ?[place] [address] ?Phone: [phone] Fax: [phone] ? ?[place], [place] ?[place] ?[place] [address] ?Phone: [phone] Fax: [phone] ? ? ? ? ?Social Determinants of Health (SDOH) Interventions ?  ? ?Readmission Risk Interventions ? ?  09/19/2021  ?  3:16 PM  ?Readmission Risk Prevention Plan  ?Transportation Screening Complete  ?PCP or Specialist Appt within 3-5 Days Complete  ?[place] or Home Care Consult Complete  ?Social Work Consult for [place] Planning/Counseling Complete  ?Palliative Care Screening Not Applicable  ?Medication Review Press photographer) Complete  ? ? ? ?

## 2022-02-15 LAB — GLUCOSE, CAPILLARY
Glucose-Capillary: 70 mg/dL (ref 70–99)
Glucose-Capillary: 136 mg/dL — ABNORMAL HIGH (ref 70–99)

## 2022-02-15 MED ORDER — DOXYCYCLINE HYCLATE 100 MG PO TABS: 100.0000 mg | ORAL_TABLET | Freq: Two times a day (BID) | ORAL | 0 refills | Status: DC

## 2022-02-15 MED ORDER — DOXYCYCLINE HYCLATE 100 MG PO TABS: 100.0000 mg | ORAL_TABLET | Freq: Two times a day (BID) | ORAL | 0 refills | Status: AC

## 2022-02-15 MED ORDER — DOXYCYCLINE HYCLATE 100 MG PO TABS
100.0000 mg | ORAL_TABLET | Freq: Two times a day (BID) | ORAL | Status: DC
  Administered 2022-02-15: 100 mg via ORAL
  Filled 2022-02-15: qty 1

## 2022-02-15 NOTE — TOC Transition Note (Signed)
Transition of Care (TOC) - CM/SW Discharge Note ? ? ?Patient Details  ?Name: Brian Dodson ?MRN: [id number] ?Date of Birth: Feb 23, 1956 ? ?Transition of Care (TOC) CM/SW Contact:  ?Chapman Fitch, RN ?Phone Number: ?02/15/2022, 2:00 PM ? ? ?Clinical Narrative:    ?Patient to discharge today ?Milinda Pointer with APS updated via email ?[facility] to transport at discharge ?Meg with [facility] notified of discharge  ? ? ?  ?Barriers to Discharge: Continued Medical Work up ? ? ?Patient Goals and CMS Choice ?  ?  ?Choice offered to / list presented to : [facility] POA / Guardian ? ?Discharge Placement ?  ?           ?  ?  ?  ?  ? ?Discharge Plan and Services ?  ?Discharge Planning Services: CM Consult ?Post Acute Care Choice: Home Health          ?  ?  ?  ?  ?  ?HH Arranged: RN, PT, OT ?HH Agency: [facility] ?Date HH Agency Contacted: 02/14/22 ?  ?Representative spoke with at [facility] Agency: Meg ? ?Social Determinants of Health (SDOH) Interventions ?  ? ? ?Readmission Risk Interventions ? ?  02/14/2022  ?  1:19 PM 09/19/2021  ?  3:16 PM  ?Readmission Risk Prevention Plan  ?Transportation Screening Complete Complete  ?PCP or Specialist Appt within 3-5 Days  Complete  ?HRI or Home Care Consult  Complete  ?Social Work Consult for Recovery Care Planning/Counseling  Complete  ?Palliative Care Screening  Not Applicable  ?Medication Review Oceanographer)  Complete  ?HRI or Home Care Consult Complete   ?SW Recovery Care/Counseling Consult Complete   ?Palliative Care Screening Not Applicable   ?Skilled Nursing Facility Not Applicable   ? ? ? ? ? ?

## 2022-02-15 NOTE — Discharge Summary (Signed)
?Physician Discharge Summary ?  ?Patient: Brian Dodson MRN: [id number] DOB: [birth year]  ?Admit date:     02/12/2022  ?Discharge date: 02/15/22  ?Discharge Physician: Dwyane Dee  ? ?PCP: [name], MD  ? ?Recommendations at discharge:  ? ? Continue wound packing and dressing changes daily  ? ?Discharge Diagnoses: ?Principal Problem: ?  Cellulitis and abscess of left leg ?Active Problems: ?  Acute renal failure superimposed on stage 2 chronic kidney disease ([place]) ?  Anemia of chronic disease ?  Schizophrenia ([place]) ?  Diabetes mellitus, type II ([place]) ?  HTN (hypertension) ?  CAD (coronary artery disease) ?  Dementia ([place]) ?  PAF (paroxysmal atrial fibrillation) ([place]) ? ?Resolved Problems: ?  * No resolved hospital problems. * ? ?Hospital Course: ?Brian Dodson is a 66 year old male with PMH A-fib, CAD, COPD, DM II, GERD, HLD, HTN, schizophrenia, vascular dementia, CKD 2 who presented with a fever and ongoing wound involving his posterior left thigh.  He was unable to provide much collateral information on admission due to his underlying cognitive deficits. ?He underwent bedside I&D in the ER and was started on vancomycin/Rocephin and admitted for further work-up. ?He was evaluated by surgery and underwent CT thigh and did not have any further evidence of abscess nor warrant any further I&D.  ?He was recommended for ongoing packing of abscess cavity at discharge. Wound culture grew MRSA and his antibiotics were de-escalated to doxycyline at discharge to complete his course.  ? ?Assessment and Plan: ?* Cellulitis and abscess of left leg ?- S/p I&D in the ED on 3/27 ?- started on Rocephin and Vanc on admission ?- CT left thigh obtained shows severe tissue edema but no drainable fluid collection remaining ?- appreciate surgery recommendations: pack wound with iodoform packing strips to ensure closure from inside out ?- needs daily packing/dressing changes; ALF reports should be able to honor this at discharge ?- wound  culture noted now with MRSA; de-escalated to doxy to complete course at discharge  ?-Home health also ordered for discharge to aid with recovery ? ?Acute renal failure superimposed on stage 2 chronic kidney disease (HCC) ?- likely has component of CKD2 due to underlying DMII and varying renal function over time ?- patient has history of CKD2. Baseline creat ~ 1 - 1.1, eGFR 60-90 ?- patient presents with increase in creat >0.3 mg/dL above baseline, creat increase >1.5x baseline presumed to have occurred within past 7 days PTA ?-Creatinine 1.5 on admission ?-Presumed prerenal/volume depletion on admission ?- Responded well to fluids ?- Renal function back to baseline ?-Continue diet ? ?Anemia of chronic disease ?- Baseline hemoglobin approximately 9 to 10 g/dL ?-Currently at baseline ? ?PAF (paroxysmal atrial fibrillation) ([place]) ?- Continue amiodarone and Eliquis ?- Continue Coreg ? ?Dementia ([place]) ?Continue donepezil and Celexa ? ?CAD (coronary artery disease) ?- Continue aspirin, atorvastatin and carvedilol ? ?HTN (hypertension) ?- Continue carvedilol ? ?Diabetes mellitus, type II ([place]) ?- continue diet control  ? ?Schizophrenia ([place]) ?Stable, without acute issues.  Continue Risperdal, Depakote and Cogentin ? ? ? ? ?  ? ? ?Consultants: Surgery ?Procedures performed: bedside I&D in ER on admission   ?Disposition: Assisted living ?Diet recommendation:  ?Discharge Diet Orders (From admission, onward)  ? ?  Start     Ordered  ? 02/15/22 0000  Diet - low sodium heart healthy       ? 02/15/22 0954  ? 02/15/22 0000  Diet Carb Modified       ? 02/15/22 0954  ? ?  ?  ? ?  ? ?  Carb modified diet ?DISCHARGE MEDICATION: ?Allergies as of 02/15/2022   ? ?   Reactions  ? Sulfa Antibiotics Other (See Comments)  ? Unknown reaction  ? Xarelto [rivaroxaban]   ? ?  ? ?  ?Medication List  ?  ? ?STOP taking these medications   ? ?Durezol 0.05 % Emul ?Generic drug: Difluprednate ?  ?EQL Vitamin D3 50 MCG (2000 UT) Caps ?Generic drug:  Cholecalciferol ?  ?Ilevro 0.3 % ophthalmic suspension ?Generic drug: nepafenac ?  ?ketorolac 0.5 % ophthalmic solution ?Commonly known as: ACULAR ?  ?Triple Antibiotic [id number] Oint ?  ? ?  ? ?TAKE these medications   ? ?acetaminophen 500 MG tablet ?Commonly known as: TYLENOL ?Take 1,000 mg by mouth every 6 (six) hours as needed for mild pain. ?  ?amiodarone 200 MG tablet ?Commonly known as: PACERONE ?Take 1 tablet (200 mg total) by mouth daily. ?  ?aspirin 81 MG tablet ?Take 81 mg by mouth daily. ?  ?atorvastatin 10 MG tablet ?Commonly known as: LIPITOR ?Take 10 mg by mouth daily. ?  ?benztropine 1 MG tablet ?Commonly known as: COGENTIN ?Take 1 mg by mouth at bedtime. ?  ?Biofreeze 4 % Gel ?Generic drug: Menthol (Topical Analgesic) ?Apply 1 application topically 3 (three) times daily. To lower back area as needed for pain ?  ?brimonidine-timolol 0.2-0.5 % ophthalmic solution ?Commonly known as: COMBIGAN ?Place 1 drop into both eyes every 12 (twelve) hours. Wait 3 to 5 minutes between drops ?  ?carvedilol 12.5 MG tablet ?Commonly known as: COREG ?Take 12.5 mg by mouth in the morning and at bedtime. ?  ?citalopram 20 MG tablet ?Commonly known as: CELEXA ?Take 20 mg by mouth daily. ?  ?diclofenac Sodium 1 % Gel ?Commonly known as: VOLTAREN ?Apply 2 g topically 4 (four) times daily as needed. Apply to neck and shoulders ?  ?divalproex 500 MG 24 hr tablet ?Commonly known as: DEPAKOTE ER ?Take 2,000 mg by mouth at bedtime. ?  ?donepezil 10 MG tablet ?Commonly known as: ARICEPT ?Take 10 mg by mouth at bedtime. ?  ?doxycycline 100 MG tablet ?Commonly known as: VIBRA-TABS ?Take 1 tablet (100 mg total) by mouth every 12 (twelve) hours for 4 days. ?  ?Eliquis 5 MG Tabs tablet ?Generic drug: apixaban ?Take 5 mg by mouth 2 (two) times daily. ?  ?ferrous sulfate 325 (65 FE) MG tablet ?Take 325 mg by mouth daily with breakfast. ?  ?furosemide 20 MG tablet ?Commonly known as: LASIX ?Take 20 mg by mouth daily as needed. For  edema (swelling) ?  ?lanolin/mineral oil Lotn ?Apply 1 application topically as needed for dry skin. Apply to face after washing face daily ?  ?Loratadine 10 MG Caps ?Take by mouth daily. ?  ?Lumigan 0.01 % Soln ?Generic drug: bimatoprost ?Place 1 drop into both eyes at bedtime. ?  ?magnesium oxide 400 MG tablet ?Commonly known as: MAG-OX ?Take 400 mg by mouth daily. ?  ?melatonin 5 MG Tabs ?Take 5 mg by mouth at bedtime. ?  ?metFORMIN 500 MG tablet ?Commonly known as: GLUCOPHAGE ?Take 500 mg by mouth 2 (two) times daily with a meal. ?  ?omeprazole 20 MG capsule ?Commonly known as: PRILOSEC ?Take 20 mg by mouth 2 (two) times daily before a meal. May open and sprinkle in applesauce. ?  ?oyster calcium 500 MG Tabs tablet ?Take 500 mg of elemental calcium by mouth 2 (two) times daily. ?  ?risperidone 4 MG tablet ?Commonly known as: RISPERDAL ?Take 4 mg by mouth at  bedtime. ?  ?sodium chloride 0.65 % Soln nasal spray ?Commonly known as: OCEAN ?Place 1 spray into both nostrils daily. ?  ? ?  ? ?  ?  ? ? ?  ?Discharge Care Instructions  ?(From admission, onward)  ?  ? ? ?  ? ?  Start     Ordered  ? 02/15/22 0000  Discharge wound care:       ?Comments: Pack wound daily with iodoform gauze, leave long tail, cover with dry gauze and secure with tape. Change this daily.  ? 02/15/22 0954  ? ?  ?  ? ?  ? ? Follow-up Information   ? ? Tylene Fantasia, PA-C Follow up on 02/27/2022.   ?Specialty: Physician Assistant ?Contact information: ?[address] ?[address] ?[place] [address] ?[phone] ? ? ?  ?  ? ?  ?  ? ?  ? ?Discharge Exam: ?Filed Weights  ? 02/12/22 2252  ?Weight: 97.5 kg  ? ?Physical Exam ?Constitutional:   ?   General: He is not in acute distress. ?   Appearance: Normal appearance.  ?HENT:  ?   Head: Normocephalic and atraumatic.  ?   Mouth/Throat:  ?   Mouth: Mucous membranes are moist.  ?Eyes:  ?   Extraocular Movements: Extraocular movements intact.  ?Cardiovascular:  ?   Rate and Rhythm: Normal rate and  regular rhythm.  ?   Heart sounds: Normal heart sounds.  ?Pulmonary:  ?   Effort: Pulmonary effort is normal. No respiratory distress.  ?   Breath sounds: Normal breath sounds. No wheezing.  ?Abdominal:  ?   Gen

## 2022-02-15 NOTE — Care Management Important Message (Signed)
Important Message ? ?Patient Details  ?Name: Brian Dodson ?MRN: [id number] ?Date of Birth: November 01, 1956 ? ? ?Medicare Important Message Given:  Yes ? ?Reviewed Medicare IM with Brian Dodson, [place] [facility], at [phone].  Noted signed initial form not on file.  Faxed copy of Medicare IM to Kailee's attention at [phone] for signature.  Will send signed copy to HIM once received.   ? ? ?Brian Dodson ?02/15/2022, 11:23 AM ?

## 2022-02-15 NOTE — NC FL2 (Signed)
?[place] MEDICAID FL2 LEVEL OF CARE SCREENING TOOL  ?  ? ?IDENTIFICATION  ?Patient Name: ?Brian Dodson Birthdate: [birth year] Sex: male Admission Date (Current Location): ?02/12/2022  ?[place] and [place] Number: ? [place] ?  Facility and Address:  ?  ?     Provider Number: ?[id number]  ?Attending Physician Name and Address:  ?Dwyane Dee, MD ? Relative Name and Phone Number:  ?  ?   ?Current Level of Care: ?Hospital Recommended Level of Care: ?Assisted Living Facility Prior Approval Number: ?  ? ?Date Approved/Denied: ?  PASRR Number: ?  ? ?Discharge Plan: ?Other (Comment) (ALF) ?  ? ?Current Diagnoses: ?Patient Active Problem List  ? Diagnosis Date Noted  ? Cellulitis and abscess of left leg 02/12/2022  ? Streptococcal bacteremia 09/18/2021  ? Acute renal failure superimposed on stage 2 chronic kidney disease ([place]) 09/17/2021  ? Sepsis secondary to UTI ([place]) 03/05/2021  ? Acute metabolic encephalopathy [id number]  ? Dementia ([place]) 03/05/2021  ? Memory loss due to medical condition 03/05/2021  ? PAF (paroxysmal atrial fibrillation) ([place]) 03/05/2021  ? Atrial flutter ([place]) 03/05/2021  ? Diabetes mellitus type 2, controlled, without complications ([place]) [id number]  ? Obese 03/05/2021  ? Anemia of chronic disease 03/04/2021  ? Acute lower UTI 03/02/2021  ? Acute febrile illness   ? Bilateral lower extremity edema 04/01/2018  ? Lower extremity pain, bilateral 04/01/2018  ? Swelling of limb 10/04/2017  ? Sepsis ([place]) 05/20/2017  ? Anemia 05/07/2017  ? B12 deficiency 05/07/2017  ? Loss of memory 05/07/2017  ? Atrial fibrillation with RVR ([place]) 05/23/2015  ? Syncope 05/23/2015  ? Schizophrenia ([place]) 05/23/2015  ? Diabetes mellitus, type II ([place]) 05/23/2015  ? HTN (hypertension) 05/23/2015  ? GERD (gastroesophageal reflux disease) 05/23/2015  ? CAD (coronary artery disease) 05/23/2015  ? ? ?Orientation RESPIRATION BLADDER Height & Weight   ?  ?Self, Place ? Normal Continent Weight: 97.5 kg ?Height:  6' (182.9 cm)   ?BEHAVIORAL SYMPTOMS/MOOD NEUROLOGICAL BOWEL NUTRITION STATUS  ?    Continent Diet (Heart Healthy Carb modified)  ?AMBULATORY STATUS COMMUNICATION OF NEEDS Skin   ?Limited Assist   Surgical wounds (Left Posterior Thigh wound: Pack wound daily with iodoform gauze, leave long tail, cover with dry gauze and secure. This needs to be done daily) ?  ?  ?  ?    ?     ?     ? ? ?Personal Care Assistance Level of Assistance  ?    ?  ?  ?   ? ?Functional Limitations Info  ?    ?  ?   ? ? ?SPECIAL CARE FACTORS FREQUENCY  ?PT (By licensed PT), OT (By licensed OT)   ?  ?PT Frequency: [facility] ?  ?  ?  ?  ?   ? ? ?Contractures Contractures Info: Not present  ? ? ?Additional Factors Info  ?Code Status, Allergies Code Status Info: Full ?Allergies Info: Sulfa antibiotics, xarelto ?  ?  ?  ?   ? ?TAKE these medications   ?  ?acetaminophen 500 MG tablet ?Commonly known as: TYLENOL ?Take 1,000 mg by mouth every 6 (six) hours as needed for mild pain. ?   ?amiodarone 200 MG tablet ?Commonly known as: PACERONE ?Take 1 tablet (200 mg total) by mouth daily. ?   ?aspirin 81 MG tablet ?Take 81 mg by mouth daily. ?   ?atorvastatin 10 MG tablet ?Commonly known as: LIPITOR ?Take 10 mg by mouth daily. ?   ?  benztropine 1 MG tablet ?Commonly known as: COGENTIN ?Take 1 mg by mouth at bedtime. ?   ?Biofreeze 4 % Gel ?Generic drug: Menthol (Topical Analgesic) ?Apply 1 application topically 3 (three) times daily. To lower back area as needed for pain ?   ?brimonidine-timolol 0.2-0.5 % ophthalmic solution ?Commonly known as: COMBIGAN ?Place 1 drop into both eyes every 12 (twelve) hours. Wait 3 to 5 minutes between drops ?   ?carvedilol 12.5 MG tablet ?Commonly known as: COREG ?Take 12.5 mg by mouth in the morning and at bedtime. ?   ?citalopram 20 MG tablet ?Commonly known as: CELEXA ?Take 20 mg by mouth daily. ?   ?diclofenac Sodium 1 % Gel ?Commonly known as: VOLTAREN ?Apply 2 g topically 4 (four) times daily as needed. Apply to neck and  shoulders ?   ?divalproex 500 MG 24 hr tablet ?Commonly known as: DEPAKOTE ER ?Take 2,000 mg by mouth at bedtime. ?   ?donepezil 10 MG tablet ?Commonly known as: ARICEPT ?Take 10 mg by mouth at bedtime. ?   ?doxycycline 100 MG tablet ?Commonly known as: VIBRA-TABS ?Take 1 tablet (100 mg total) by mouth every 12 (twelve) hours for 4 days. ?   ?Eliquis 5 MG Tabs tablet ?Generic drug: apixaban ?Take 5 mg by mouth 2 (two) times daily. ?   ?ferrous sulfate 325 (65 FE) MG tablet ?Take 325 mg by mouth daily with breakfast. ?   ?furosemide 20 MG tablet ?Commonly known as: LASIX ?Take 20 mg by mouth daily as needed. For edema (swelling) ?   ?lanolin/mineral oil Lotn ?Apply 1 application topically as needed for dry skin. Apply to face after washing face daily ?   ?Loratadine 10 MG Caps ?Take by mouth daily. ?   ?Lumigan 0.01 % Soln ?Generic drug: bimatoprost ?Place 1 drop into both eyes at bedtime. ?   ?magnesium oxide 400 MG tablet ?Commonly known as: MAG-OX ?Take 400 mg by mouth daily. ?   ?melatonin 5 MG Tabs ?Take 5 mg by mouth at bedtime. ?   ?metFORMIN 500 MG tablet ?Commonly known as: GLUCOPHAGE ?Take 500 mg by mouth 2 (two) times daily with a meal. ?   ?omeprazole 20 MG capsule ?Commonly known as: PRILOSEC ?Take 20 mg by mouth 2 (two) times daily before a meal. May open and sprinkle in applesauce. ?   ?oyster calcium 500 MG Tabs tablet ?Take 500 mg of elemental calcium by mouth 2 (two) times daily. ?   ?risperidone 4 MG tablet ?Commonly known as: RISPERDAL ?Take 4 mg by mouth at bedtime. ?   ?sodium chloride 0.65 % Soln nasal spray ?Commonly known as: OCEAN ?Place 1 spray into both nostrils daily. ?   ? Relevant Imaging Results: ? ?Relevant Lab Results: ? ? ?Additional Information ?  ? ?Beverly Sessions, RN ? ? ? ? ?

## 2022-02-18 LAB — AEROBIC/ANAEROBIC CULTURE W GRAM STAIN (SURGICAL/DEEP WOUND)

## 2022-03-06 ENCOUNTER — Encounter: Payer: Self-pay | Admitting: Ophthalmology

## 2022-03-12 NOTE — Discharge Instructions (Signed)

## 2022-03-13 ENCOUNTER — Ambulatory Visit: Payer: Medicare Other | Admitting: Anesthesiology

## 2022-03-13 ENCOUNTER — Encounter: Payer: Self-pay | Admitting: Ophthalmology

## 2022-03-13 ENCOUNTER — Ambulatory Visit
Admission: RE | Admit: 2022-03-13 | Discharge: 2022-03-13 | Disposition: A | Discharge status: Home / Self Care | Payer: Medicare Other | Source: Ambulatory Visit | Attending: Ophthalmology | Admitting: Ophthalmology

## 2022-03-13 ENCOUNTER — Encounter
Admission: RE | Disposition: A | Discharge status: Home / Self Care | Payer: Self-pay | Source: Ambulatory Visit | Attending: Ophthalmology

## 2022-03-13 ENCOUNTER — Other Ambulatory Visit: Payer: Self-pay

## 2022-03-13 DIAGNOSIS — F0150 Vascular dementia without behavioral disturbance: Secondary | ICD-10-CM | POA: Diagnosis not present

## 2022-03-13 DIAGNOSIS — I4891 Unspecified atrial fibrillation: Secondary | ICD-10-CM | POA: Insufficient documentation

## 2022-03-13 DIAGNOSIS — N189 Chronic kidney disease, unspecified: Secondary | ICD-10-CM | POA: Diagnosis not present

## 2022-03-13 DIAGNOSIS — E1136 Type 2 diabetes mellitus with diabetic cataract: Secondary | ICD-10-CM | POA: Diagnosis present

## 2022-03-13 DIAGNOSIS — K219 Gastro-esophageal reflux disease without esophagitis: Secondary | ICD-10-CM | POA: Diagnosis not present

## 2022-03-13 DIAGNOSIS — I2510 Atherosclerotic heart disease of native coronary artery without angina pectoris: Secondary | ICD-10-CM | POA: Insufficient documentation

## 2022-03-13 DIAGNOSIS — Z955 Presence of coronary angioplasty implant and graft: Secondary | ICD-10-CM | POA: Insufficient documentation

## 2022-03-13 DIAGNOSIS — Z833 Family history of diabetes mellitus: Secondary | ICD-10-CM | POA: Diagnosis not present

## 2022-03-13 DIAGNOSIS — E1122 Type 2 diabetes mellitus with diabetic chronic kidney disease: Secondary | ICD-10-CM | POA: Diagnosis not present

## 2022-03-13 DIAGNOSIS — Z87891 Personal history of nicotine dependence: Secondary | ICD-10-CM | POA: Diagnosis not present

## 2022-03-13 DIAGNOSIS — J449 Chronic obstructive pulmonary disease, unspecified: Secondary | ICD-10-CM | POA: Insufficient documentation

## 2022-03-13 DIAGNOSIS — H2512 Age-related nuclear cataract, left eye: Secondary | ICD-10-CM | POA: Diagnosis not present

## 2022-03-13 DIAGNOSIS — I129 Hypertensive chronic kidney disease with stage 1 through stage 4 chronic kidney disease, or unspecified chronic kidney disease: Secondary | ICD-10-CM | POA: Diagnosis not present

## 2022-03-13 HISTORY — PX: CATARACT EXTRACTION W/PHACO: SHX586

## 2022-03-13 LAB — GLUCOSE, CAPILLARY
Glucose-Capillary: 77 mg/dL (ref 70–99)
Glucose-Capillary: 97 mg/dL (ref 70–99)

## 2022-03-13 SURGERY — PHACOEMULSIFICATION, CATARACT, WITH IOL INSERTION
Anesthesia: Monitor Anesthesia Care | Site: Eye | Laterality: Left

## 2022-03-13 MED ORDER — MOXIFLOXACIN HCL 0.5 % OP SOLN
OPHTHALMIC | Status: DC | PRN
  Administered 2022-03-13: 0.2 mL via OPHTHALMIC

## 2022-03-13 MED ORDER — SIGHTPATH DOSE#1 BSS IO SOLN
INTRAOCULAR | Status: DC | PRN
  Administered 2022-03-13: 1 mL via INTRAMUSCULAR

## 2022-03-13 MED ORDER — TETRACAINE HCL 0.5 % OP SOLN
1.0000 [drp] | OPHTHALMIC | Status: DC | PRN
  Administered 2022-03-13 (×3): 1 [drp] via OPHTHALMIC

## 2022-03-13 MED ORDER — PHENYLEPHRINE HCL 10 % OP SOLN
1.0000 [drp] | Freq: Once | OPHTHALMIC | Status: AC
  Administered 2022-03-13: 1 [drp] via OPHTHALMIC

## 2022-03-13 MED ORDER — ACETAMINOPHEN 325 MG PO TABS: 325.0000 mg | ORAL_TABLET | ORAL | Status: DC | PRN

## 2022-03-13 MED ORDER — ACETAMINOPHEN 160 MG/5ML PO SOLN: 325.0000 mg | ORAL | Status: DC | PRN

## 2022-03-13 MED ORDER — SIGHTPATH DOSE#1 BSS IO SOLN
INTRAOCULAR | Status: DC | PRN
  Administered 2022-03-13: 79 mL via OPHTHALMIC

## 2022-03-13 MED ORDER — DEXTROSE 50 % IV SOLN
12.5000 g | Freq: Once | INTRAVENOUS | Status: AC
Start: 2022-03-13 — End: 2022-03-13
  Administered 2022-03-13: 12.5 g via INTRAVENOUS

## 2022-03-13 MED ORDER — SIGHTPATH DOSE#1 BSS IO SOLN
INTRAOCULAR | Status: DC | PRN
  Administered 2022-03-13: 15 mL

## 2022-03-13 MED ORDER — ARMC OPHTHALMIC DILATING DROPS
1.0000 "application " | OPHTHALMIC | Status: DC | PRN
  Administered 2022-03-13 (×3): 1 via OPHTHALMIC

## 2022-03-13 MED ORDER — MIDAZOLAM HCL 2 MG/2ML IJ SOLN
INTRAMUSCULAR | Status: DC | PRN
  Administered 2022-03-13: 1 mg via INTRAVENOUS

## 2022-03-13 MED ORDER — SIGHTPATH DOSE#1 NA CHONDROIT SULF-NA HYALURON 40-17 MG/ML IO SOLN
INTRAOCULAR | Status: DC | PRN
  Administered 2022-03-13: 1 mL via INTRAOCULAR

## 2022-03-13 MED ORDER — ONDANSETRON HCL 4 MG/2ML IJ SOLN: 4.0000 mg | Freq: Once | INTRAMUSCULAR | Status: DC | PRN

## 2022-03-13 MED ORDER — BRIMONIDINE TARTRATE-TIMOLOL 0.2-0.5 % OP SOLN
OPHTHALMIC | Status: DC | PRN
Start: 2022-03-13 — End: 2022-03-13
  Administered 2022-03-13: 1 [drp] via OPHTHALMIC

## 2022-03-13 SURGICAL SUPPLY — 11 items
CATARACT SUITE SIGHTPATH (MISCELLANEOUS) ×2 IMPLANT
FEE CATARACT SUITE SIGHTPATH (MISCELLANEOUS) ×1 IMPLANT
GLOVE SURG ENC TEXT LTX SZ8 (GLOVE) ×2 IMPLANT
GLOVE SURG TRIUMPH 8.0 PF LTX (GLOVE) ×2 IMPLANT
LENS IOL TECNIS EYHANCE 21.5 (Intraocular Lens) ×1 IMPLANT
NDL FILTER BLUNT 18X1 1/2 (NEEDLE) ×1 IMPLANT
NEEDLE FILTER BLUNT 18X 1/2SAF (NEEDLE) ×1
NEEDLE FILTER BLUNT 18X1 1/2 (NEEDLE) ×1 IMPLANT
RING MALYGIN (MISCELLANEOUS) ×1 IMPLANT
SYR 3ML LL SCALE MARK (SYRINGE) ×2 IMPLANT
WATER STERILE IRR 250ML POUR (IV SOLUTION) ×2 IMPLANT

## 2022-03-13 NOTE — Op Note (Signed)
PREOPERATIVE DIAGNOSIS:  Nuclear sclerotic cataract of the left eye. ?  ?POSTOPERATIVE DIAGNOSIS:  Nuclear sclerotic cataract of the left eye. ?  ?OPERATIVE PROCEDURE:ORPROCALL@ ?  ?SURGEON:  Galen Manila, MD. ?  ?ANESTHESIA: ? ?Anesthesiologist: Fletcher Anon, MD ?CRNA: Derenda Mis, CRNA ? ?1.      Managed anesthesia care. ?2.     0.59ml of Shugarcaine was instilled following the paracentesis ?  ?COMPLICATIONS: Viscoelastic was used to raise the pupil margin.  A  Malyugin ring was placed as the pupil would not achieve sufficient pharmacologic dilation to undergo cataract extraction safely.( The ring was removed atraumatically following insertion of the IOL.) ? ?  ?TECHNIQUE:   Stop and chop ?  ?DESCRIPTION OF PROCEDURE:  The patient was examined and consented in the preoperative holding area where the aforementioned topical anesthesia was applied to the left eye and then brought back to the Operating Room where the left eye was prepped and draped in the usual sterile ophthalmic fashion and a lid speculum was placed. A paracentesis was created with the side port blade and the anterior chamber was filled with viscoelastic. A near clear corneal incision was performed with the steel keratome. A continuous curvilinear capsulorrhexis was performed with a cystotome followed by the capsulorrhexis forceps. Hydrodissection and hydrodelineation were carried out with BSS on a blunt cannula. The lens was removed in a stop and chop  technique and the remaining cortical material was removed with the irrigation-aspiration handpiece. The capsular bag was inflated with viscoelastic and the Technis [id number] lens was placed in the capsular bag without complication. The remaining viscoelastic was removed from the eye with the irrigation-aspiration handpiece. The wounds were hydrated. The anterior chamber was flushed with BSS and the eye was inflated to physiologic pressure. 0.4ml Vigamox was placed in the anterior chamber. The  wounds were found to be water tight. The eye was dressed with Combigan. The patient was given protective glasses to wear throughout the day and a shield with which to sleep tonight. The patient was also given drops with which to begin a drop regimen today and will follow-up with me in one day. ?Implant Name Type Inv. Item Serial No. Manufacturer Lot No. LRB No. Used Action  ?LENS IOL TECNIS EYHANCE 21.5 - [id number] Intraocular Lens LENS IOL TECNIS EYHANCE 21.5 [id number] [employer]  Left 1 Implanted  ?  ?Procedure(s) with comments: ?CATARACT EXTRACTION PHACO AND INTRAOCULAR LENS PLACEMENT (IOC) LEFT DIABETIC malyugin 8.39 01:02.9  (Left) - Diabetic ? ?Electronically signed: Galen Manila 03/13/2022 7:58 AM ? ?

## 2022-03-13 NOTE — Anesthesia Postprocedure Evaluation (Signed)
Anesthesia Post Note ? ?Patient: Brian Dodson ? ?Procedure(s) Performed: CATARACT EXTRACTION PHACO AND INTRAOCULAR LENS PLACEMENT (IOC) LEFT DIABETIC malyugin 8.39 01:02.9  (Left: Eye) ? ? ?  ?Patient location during evaluation: PACU ?Anesthesia Type: MAC ?Level of consciousness: awake and alert ?Pain management: pain level controlled ?Vital Signs Assessment: post-procedure vital signs reviewed and stable ?Respiratory status: spontaneous breathing and nonlabored ventilation ?Cardiovascular status: blood pressure returned to baseline ?Postop Assessment: no apparent nausea or vomiting ?Anesthetic complications: no ? ? ?No notable events documented. ? ?Ardeth Sportsman ? ? ? ? ? ?

## 2022-03-13 NOTE — H&P (Signed)
[facility]  ? ?Primary Care Physician:  Galvin Proffer, MD ?Ophthalmologist: Dr. Druscilla Brownie ? ?Pre-Procedure History & Physical: ?HPI:  Brian Dodson is a 66 y.o. male here for cataract surgery. ?  ?Past Medical History:  ?Diagnosis Date  ? Anemia   ? Asthma   ? Atrial fibrillation (HCC)   ? Benign prostatic hypertrophy with lower urinary tract symptoms (LUTS)   ? Bulbous urethral stricture   ? CAD (coronary artery disease)   ? s/p coronary stent 2003  ? CKD (chronic kidney disease)   ? Constipation   ? COPD (chronic obstructive pulmonary disease) (HCC)   ? Diabetes mellitus without complication (HCC)   ? type 2  ? Dizziness   ? Dyspnea   ? GERD (gastroesophageal reflux disease)   ? Gross hematuria   ? Hyperlipemia   ? Hypertension   ? Lumbago   ? Palpitation   ? Schizophrenia (HCC)   ? Vascular dementia (HCC)   ? ? ?Past Surgical History:  ?Procedure Laterality Date  ? CATARACT EXTRACTION W/PHACO Right 01/16/2022  ? Procedure: CATARACT EXTRACTION PHACO AND INTRAOCULAR LENS PLACEMENT (IOC) RIGHT DIABETIC 4.48 00:34.3;  Surgeon: Galen Manila, MD;  Location: [facility];  Service: Ophthalmology;  Laterality: Right;  Diabetic  ? COLONOSCOPY    ? COLONOSCOPY WITH PROPOFOL N/A 09/22/2021  ? Procedure: COLONOSCOPY WITH PROPOFOL;  Surgeon: Regis Bill, MD;  Location: [facility] END[facility];  Service: Endoscopy;  Laterality: N/A;  ? COLONOSCOPY WITH PROPOFOL N/A 01/02/2022  ? Procedure: COLONOSCOPY WITH PROPOFOL;  Surgeon: Regis Bill, MD;  Location: [facility]COPY;  Service: Endoscopy;  Laterality: N/A;  ? CORONARY ANGIOPLASTY WITH STENT PLACEMENT  11/2003  ? kidney stent    ? TEE WITHOUT CARDIOVERSION N/A 09/20/2021  ? Procedure: TRANSESOPHAGEAL ECHOCARDIOGRAM (TEE);  Surgeon: Dalia Heading, MD;  Location: [facility] ORS;  Service: Cardiovascular;  Laterality: N/A;  ? ? ?Prior to Admission medications   ?Medication Sig Start Date End Date Taking? Authorizing Provider  ?acetaminophen (TYLENOL) 500 MG  tablet Take 1,000 mg by mouth every 6 (six) hours as needed for mild pain.   Yes [provider]  ?amiodarone (PACERONE) 200 MG tablet Take 1 tablet (200 mg total) by mouth daily. 07/26/21  Yes Charise Killian, MD  ?aspirin 81 MG tablet Take 81 mg by mouth daily.    Yes [provider]  ?atorvastatin (LIPITOR) 10 MG tablet Take 10 mg by mouth daily. 05/18/15  Yes [provider]  ?benztropine (COGENTIN) 1 MG tablet Take 1 mg by mouth at bedtime.    Yes [provider]  ?brimonidine-timolol (COMBIGAN) 0.2-0.5 % ophthalmic solution Place 1 drop into both eyes every 12 (twelve) hours. Wait 3 to 5 minutes between drops   Yes [provider]  ?carvedilol (COREG) 12.5 MG tablet Take 12.5 mg by mouth in the morning and at bedtime.   Yes [provider]  ?citalopram (CELEXA) 20 MG tablet Take 20 mg by mouth daily.   Yes [provider]  ?diclofenac Sodium (VOLTAREN) 1 % GEL Apply 2 g topically 4 (four) times daily as needed. Apply to neck and shoulders 08/18/21  Yes Lurene Shadow, MD  ?divalproex (DEPAKOTE ER) 500 MG 24 hr tablet Take 2,000 mg by mouth at bedtime.    Yes [provider]  ?donepezil (ARICEPT) 10 MG tablet Take 10 mg by mouth at bedtime.    Yes [provider]  ?ELIQUIS 5 MG TABS tablet Take 5 mg by mouth  2 (two) times daily. 05/18/15  Yes [provider]  ?ferrous sulfate 325 (65 FE) MG tablet Take 325 mg by mouth daily with breakfast.   Yes [provider]  ?furosemide (LASIX) 20 MG tablet Take 20 mg by mouth daily as needed. For edema (swelling) 06/22/21  Yes [provider]  ?lanolin/mineral oil (KERI/THERA-DERM) LOTN Apply 1 application topically as needed for dry skin. Apply to face after washing face daily   Yes [provider]  ?Loratadine 10 MG CAPS Take by mouth daily.   Yes [provider]  ?LUMIGAN 0.01 % SOLN Place 1 drop into both eyes at bedtime. 07/06/21  Yes [provider]  ?magnesium oxide (MAG-OX) 400 MG tablet Take 400 mg by mouth daily.   Yes [provider]  ?melatonin 5 MG TABS Take 5 mg by mouth at bedtime.   Yes [provider]  ?Menthol, Topical Analgesic, (BIOFREEZE) 4 % GEL Apply 1 application topically 3 (three) times daily. To lower back area as needed for pain   Yes [provider]  ?metFORMIN (GLUCOPHAGE) 500 MG tablet Take 500 mg by mouth 2 (two) times daily with a meal.   Yes [provider]  ?omeprazole (PRILOSEC) 20 MG capsule Take 20 mg by mouth 2 (two) times daily before a meal. May open and sprinkle in applesauce.   Yes [provider]  ?Ethelda Chickyster Shell (OYSTER CALCIUM) 500 MG TABS tablet Take 500 mg of elemental calcium by mouth 2 (two) times daily.   Yes [provider]  ?risperidone (RISPERDAL) 4 MG tablet Take 4 mg by mouth at bedtime. 05/18/15  Yes [provider]  ?sodium chloride (OCEAN) 0.65 % SOLN nasal spray Place 1 spray into both nostrils daily.   Yes [provider]  ? ? ?Allergies as of 02/06/2022 - Review Complete 01/23/2022  ?Allergen Reaction Noted  ? Sulfa antibiotics Other (See Comments) 08/15/2021  ? Xarelto [rivaroxaban]  07/22/2021  ? ? ?Family History  ?Problem Relation Age of Onset  ? Diabetes Other   ? Bladder Cancer Neg Hx   ? Kidney cancer Neg Hx   ? Prostate cancer Neg Hx   ? ? ?Social History  ? ?Socioeconomic History  ? Marital status: Single  ?  Spouse name: Not on file  ? Number of children: Not on file  ? Years of education: Not on file  ? Highest education level: Not on file  ?Occupational History  ? Not on file  ?Tobacco Use  ? Smoking status: Former  ? Smokeless tobacco: Never  ?Vaping Use  ? Vaping Use: Never used  ?Substance and Sexual Activity  ? Alcohol use: No  ? Drug use: No  ? Sexual activity: Not on file  ?Other Topics Concern  ? Not on file  ?Social History Narrative  ? Not on file  ? ?Social Determinants of Health  ? ?Financial Resource  Strain: Not on file  ?Food Insecurity: Not on file  ?Transportation Needs: Not on file  ?Physical Activity: Not on file  ?Stress: Not on file  ?Social Connections: Not on file  ?Intimate Partner Violence: Not on file  ? ? ?Review of Systems: ?See HPI, otherwise negative ROS ? ?Physical Exam: ?BP 104/75   Pulse 69   Temp (!) 97.2 ?F (36.2 ?C)   Ht 6' 1.5" (1.867 m)   Wt 100.7 kg   SpO2 100%   BMI 28.89 kg/m?  ?General:   Alert, cooperative in NAD ?Head:  Normocephalic and  atraumatic. ?Respiratory:  Normal work of breathing. ?Cardiovascular:  RRR ? ?Impression/Plan: ?ELWARD NOCERA is here for cataract surgery. ? ?Risks, benefits, limitations, and alternatives regarding cataract surgery have been reviewed with the patient.  Questions have been answered.  All parties agreeable. ? ? ?Galen Manila, MD  03/13/2022, 7:11 AM ? ? ?

## 2022-03-13 NOTE — Anesthesia Preprocedure Evaluation (Signed)
Anesthesia Evaluation  ?Patient identified by MRN, date of birth, ID band ?Patient awake ? ? ? ?Reviewed: ?Allergy & Precautions, H&P , NPO status , Patient's Chart, lab work & pertinent test results, reviewed documented beta blocker date and time  ? ?Airway ?Mallampati: II ? ?TM Distance: >3 FB ?Neck ROM: full ? ? ? Dental ? ?(+) Edentulous Upper, Edentulous Lower ?  ?Pulmonary ?asthma , COPD, former smoker,  ?  ?Pulmonary exam normal ? ? ? ? ? ? ? Cardiovascular ?hypertension, + CAD and + Cardiac Stents  ?Normal cardiovascular exam+ dysrhythmias Atrial Fibrillation  ? ? ?  ?Neuro/Psych ?PSYCHIATRIC DISORDERS Schizophrenia Dementia negative neurological ROS ?   ? GI/Hepatic ?Neg liver ROS, GERD  Controlled,  ?Endo/Other  ?diabetes, Type 2 ? Renal/GU ?CRFRenal disease  ?negative genitourinary ?  ?Musculoskeletal ? ? Abdominal ?Normal abdominal exam  (+)   ?Peds ? Hematology ? ?(+) Blood dyscrasia, anemia ,   ?Anesthesia Other Findings ? ? Reproductive/Obstetrics ?negative OB ROS ? ?  ? ? ? ? ? ? ? ? ? ? ? ? ? ?  ?  ? ? ? ? ? ? ? ? ?Anesthesia Physical ? ?Anesthesia Plan ? ?ASA: 3 ? ?Anesthesia Plan: MAC  ? ?Post-op Pain Management:   ? ?Induction: Intravenous ? ?PONV Risk Score and Plan: 1 and TIVA, Midazolam and Treatment may vary due to age or medical condition ? ?Airway Management Planned: Nasal Cannula and Natural Airway ? ?Additional Equipment: None ? ?Intra-op Plan:  ? ?Post-operative Plan:  ? ?Informed Consent: I have reviewed the patients History and Physical, chart, labs and discussed the procedure including the risks, benefits and alternatives for the proposed anesthesia with the patient or authorized representative who has indicated his/her understanding and acceptance.  ? ? ? ?Dental Advisory Given ? ?Plan Discussed with: CRNA ? ?Anesthesia Plan Comments: (Patient's blood glucose 77 and he states he feels a little funny. RN to administer 12.60m D50 in preop holding.)   ? ? ? ? ? ? ?Anesthesia Quick Evaluation ? ?

## 2022-03-13 NOTE — Transfer of Care (Signed)
Immediate Anesthesia Transfer of Care Note ? ?Patient: Brian Dodson ? ?Procedure(s) Performed: CATARACT EXTRACTION PHACO AND INTRAOCULAR LENS PLACEMENT (IOC) LEFT DIABETIC malyugin 8.39 01:02.9  (Left: Eye) ? ?Patient Location: PACU ? ?Anesthesia Type: MAC ? ?Level of Consciousness: awake, alert  and patient cooperative ? ?Airway and Oxygen Therapy: Patient Spontanous Breathing and Patient connected to supplemental oxygen ? ?Post-op Assessment: Post-op Vital signs reviewed, Patient's Cardiovascular Status Stable, Respiratory Function Stable, Patent Airway and No signs of Nausea or vomiting ? ?Post-op Vital Signs: Reviewed and stable ? ?Complications: No notable events documented. ? ?

## 2022-04-25 ENCOUNTER — Emergency Department
Admission: EM | Admit: 2022-04-25 | Discharge: 2022-04-25 | Disposition: A | Discharge status: Skilled Nursing Facility | Payer: Medicare Other | Attending: Emergency Medicine | Admitting: Emergency Medicine

## 2022-04-25 ENCOUNTER — Other Ambulatory Visit: Payer: Self-pay

## 2022-04-25 ENCOUNTER — Encounter: Payer: Self-pay | Admitting: Emergency Medicine

## 2022-04-25 DIAGNOSIS — R300 Dysuria: Secondary | ICD-10-CM | POA: Insufficient documentation

## 2022-04-25 DIAGNOSIS — F0390 Unspecified dementia without behavioral disturbance: Secondary | ICD-10-CM | POA: Insufficient documentation

## 2022-04-25 DIAGNOSIS — I2510 Atherosclerotic heart disease of native coronary artery without angina pectoris: Secondary | ICD-10-CM | POA: Diagnosis not present

## 2022-04-25 DIAGNOSIS — E119 Type 2 diabetes mellitus without complications: Secondary | ICD-10-CM | POA: Insufficient documentation

## 2022-04-25 DIAGNOSIS — N182 Chronic kidney disease, stage 2 (mild): Secondary | ICD-10-CM | POA: Diagnosis not present

## 2022-04-25 DIAGNOSIS — I129 Hypertensive chronic kidney disease with stage 1 through stage 4 chronic kidney disease, or unspecified chronic kidney disease: Secondary | ICD-10-CM | POA: Insufficient documentation

## 2022-04-25 DIAGNOSIS — R309 Painful micturition, unspecified: Secondary | ICD-10-CM | POA: Diagnosis not present

## 2022-04-25 LAB — BASIC METABOLIC PANEL
Anion gap: 4 — ABNORMAL LOW (ref 5–15)
BUN: 29 mg/dL — ABNORMAL HIGH (ref 8–23)
CO2: 29 mmol/L (ref 22–32)
Calcium: 9 mg/dL (ref 8.9–10.3)
Chloride: 108 mmol/L (ref 98–111)
Creatinine, Ser: 1.21 mg/dL (ref 0.61–1.24)
GFR, Estimated: >60 mL/min (ref >60)
Glucose, Bld: 103 mg/dL — ABNORMAL HIGH (ref 70–99)
Potassium: 4.6 mmol/L (ref 3.5–5.1)
Sodium: 141 mmol/L (ref 135–145)

## 2022-04-25 LAB — URINALYSIS, ROUTINE W REFLEX MICROSCOPIC
Bilirubin Urine: NEGATIVE
Glucose, UA: NEGATIVE mg/dL
Hgb urine dipstick: NEGATIVE
Ketones, ur: NEGATIVE mg/dL
Leukocytes,Ua: NEGATIVE
Nitrite: NEGATIVE
Protein, ur: NEGATIVE mg/dL
Specific Gravity, Urine: 1.026 (ref 1.005–1.030)
pH: 6 (ref 5.0–8.0)

## 2022-04-25 LAB — CBC
HCT: 31.5 % — ABNORMAL LOW (ref 39.0–52.0)
Hemoglobin: 9.8 g/dL — ABNORMAL LOW (ref 13.0–17.0)
MCH: 31.8 pg (ref 26.0–34.0)
MCHC: 31.1 g/dL (ref 30.0–36.0)
MCV: 102.3 fL — ABNORMAL HIGH (ref 80.0–100.0)
Platelets: 133 10*3/uL — ABNORMAL LOW (ref 150–400)
RBC: 3.08 MIL/uL — ABNORMAL LOW (ref 4.22–5.81)
RDW: 14.4 % (ref 11.5–15.5)
WBC: 5.1 10*3/uL (ref 4.0–10.5)
nRBC: 0 % (ref 0.0–0.2)

## 2022-04-25 LAB — CHLAMYDIA/NGC RT PCR (ARMC ONLY)
Chlamydia Tr: NOT DETECTED
N gonorrhoeae: NOT DETECTED

## 2022-04-25 MED ORDER — LEVOFLOXACIN 500 MG PO TABS: 500.0000 mg | ORAL_TABLET | Freq: Every day | ORAL | 0 refills | Status: AC

## 2022-04-25 NOTE — ED Notes (Addendum)
Bladder scan--167 mL

## 2022-04-25 NOTE — ED Provider Notes (Signed)
[facility] Provider Note    Event Date/Time   First MD Initiated Contact with Patient 04/25/22 1645     (approximate)   History   Dysuria   HPI  Brian Dodson is a 66 y.o. male with a past medical history of urosepsis, CKD stage II, dementia, paroxysmal atrial fibrillation, type 2 diabetes, obesity, anemia of chronic disease, atrial fibrillation, hypertension, coronary artery disease, schizophrenia, vascular dementia who presents for burning with urination. He feels that he is able to empty his bladder completely.  He denies any fevers or chills.  He has not been vomiting.  He denies any weakness in his legs.  He has not had any stooling problems.  Immediately when I walk into the door, patient reports that he needs to use the bathroom.  He was able to urinate approximately 300 cc into the urinal.  Postvoid residual shows that patient is retaining 101 cc of urine only.  Per chart review, patient had a recent admission in March for cellulitis and abscess of his posterior left thigh.  He was evaluated by surgery at that time and did not have any further evidence of abscess  Patient Active Problem List   Diagnosis Date Noted   Cellulitis and abscess of left leg 02/12/2022   Streptococcal bacteremia 09/18/2021   Acute renal failure superimposed on stage 2 chronic kidney disease ([place]) 09/17/2021   Sepsis secondary to UTI ([place]) [id number]   Acute metabolic encephalopathy [id number]   Dementia ([place]) 03/05/2021   Memory loss due to medical condition 03/05/2021   PAF (paroxysmal atrial fibrillation) ([place]) 03/05/2021   Atrial flutter ([place]) 03/05/2021   Diabetes mellitus type 2, controlled, without complications ([place]) [id number]   Obese 03/05/2021   Anemia of chronic disease 03/04/2021   Acute lower UTI 03/02/2021   Acute febrile illness    Bilateral lower extremity edema 04/01/2018   Lower extremity pain, bilateral 04/01/2018   Swelling of limb 10/04/2017    Sepsis ([place]) 05/20/2017   Anemia 05/07/2017   B12 deficiency 05/07/2017   Loss of memory 05/07/2017   Atrial fibrillation with RVR ([place]) 05/23/2015   Syncope 05/23/2015   Schizophrenia ([place]) 05/23/2015   Diabetes mellitus, type II ([place]) 05/23/2015   HTN (hypertension) 05/23/2015   GERD (gastroesophageal reflux disease) 05/23/2015   CAD (coronary artery disease) 05/23/2015          Physical Exam   Triage Vital Signs: ED Triage Vitals  Enc Vitals Group     BP 04/25/22 1552 122/77     Pulse Rate 04/25/22 1552 (!) 46     Resp 04/25/22 1552 16     Temp 04/25/22 1552 98.4 F (36.9 C)     Temp Source 04/25/22 1552 Oral     SpO2 04/25/22 1552 97 %     Weight 04/25/22 1551 222 lb 0.1 oz (100.7 kg)     Height 04/25/22 1551 6' 1.5" (1.867 m)     Head Circumference --      Peak Flow --      Pain Score 04/25/22 1551 0     Pain Loc --      Pain Edu? --      Excl. in [place]? --     Most recent vital signs: Vitals:   04/25/22 1552  BP: 122/77  Pulse: (!) 46  Resp: 16  Temp: 98.4 F (36.9 C)  SpO2: 97%    Physical Exam Vitals and nursing note reviewed.  Constitutional:  General: Awake and alert. No acute distress.    Appearance: Normal appearance. He is well-developed and normal weight.  HENT:     Head: Normocephalic and atraumatic.     Mouth/Throat:     Mouth: Mucous membranes are moist.  Eyes:     General: PERRL. Normal EOMs        Right eye: No discharge.        Left eye: No discharge.     Conjunctiva/sclera: Conjunctivae normal.  Cardiovascular:     Rate and Rhythm: Normal rate and regular rhythm.     Pulses: Normal pulses.     Heart sounds: Normal heart sounds Pulmonary:     Effort: Pulmonary effort is normal. No respiratory distress.     Breath sounds: Normal breath sounds.  Abdominal:     Abdomen is soft. There is no abdominal tenderness. No rebound or guarding. No distention. No CVAT GU: Normal genitalia. No testicular tenderness. No erythema, swelling,  or lesions. Musculoskeletal:        General: No swelling. Normal range of motion.     Cervical back: Normal range of motion and neck supple.  Back: No midline tenderness. Strength and sensation 5/5 to bilateral lower extremities. Normal great toe extension against resistance. Normal sensation throughout feet. Normal patellar reflexes. Negative SLR and opposite SLR bilaterally. Lymphadenopathy:     Cervical: No cervical adenopathy.  Skin:    General: Skin is warm and dry.     Capillary Refill: Capillary refill takes less than 2 seconds.     Findings: No rash.  Left posterior thigh: 1 x 1 cm circular open wound.  No surrounding induration or fluctuance.  No swelling.  No purulence.  Nontender to palpation.  No tenderness around wound or to rest of thigh.  No crepitus Neurological:     Mental Status: He is alert.      ED Results / Procedures / Treatments   Labs (all labs ordered are listed, but only abnormal results are displayed) Labs Reviewed  URINALYSIS, ROUTINE W REFLEX MICROSCOPIC - Abnormal; Notable for the following components:      Result Value   Color, Urine YELLOW (*)    APPearance CLEAR (*)    All other components within normal limits  CBC - Abnormal; Notable for the following components:   RBC 3.08 (*)    Hemoglobin 9.8 (*)    HCT 31.5 (*)    MCV 102.3 (*)    Platelets 133 (*)    All other components within normal limits  BASIC METABOLIC PANEL - Abnormal; Notable for the following components:   Glucose, Bld 103 (*)    BUN 29 (*)    Anion gap 4 (*)    All other components within normal limits  CHLAMYDIA/NGC RT PCR ([facility] ONLY)               EKG     RADIOLOGY     PROCEDURES:  Critical Care performed:   Procedures   MEDICATIONS ORDERED IN ED: Medications - No data to display   IMPRESSION / MDM / [place] / ED COURSE  I reviewed the triage vital signs and the nursing notes.   Differential diagnosis includes, but is not limited to,  urinary tract infection, prostatitis, pyelonephritis, cellulitis, abscess.  Patient is awake and alert, hemodynamically stable and afebrile and in no acute distress.  He is at his mental baseline.  Labs were obtained in triage and are overall reassuring and at his baseline.  I reviewed his chart, I also reviewed images of his left leg wound from March, appears to be improved significantly today.  There is no tenderness, fluctuance, or induration to suggest worsening infection.  No crepitus.  No hemodynamic instability.  Patient denies any tenderness to this location.  Urinalysis appears to be clean.  Postvoid residual shows 100 cc of retained urine, Foley unnecessary.  Given his burning with urination, prostatitis is a possibility.  Will start on levofloxacin given that he has an allergy to Bactrim.  Given that he is also on amiodarone, considered QTc prolongation with Levaquin.  Current QTc is 397.  He was instructed to follow-up with urology this week for consideration of extending the levofloxacin therapy.  All recommendations written on his paperwork.  We discussed strict return precautions, and return precautions were also noted on his paperwork for his home.  Nursing discussed with the legal guardian work-up and treatment plan, and EMS was arranged for his return home.  Patient was discussed with and also seen by Dr. Jari Pigg who agrees with assessment and plan  Patient's presentation is most consistent with acute complicated illness / injury requiring diagnostic workup.   Clinical Course as of 04/25/22 2222  Wed Apr 25, 2022  1808 PVR 101 [JP]    Clinical Course User Index [JP] Devonna Oboyle, Clarnce Flock, PA-C     FINAL CLINICAL IMPRESSION(S) / ED DIAGNOSES   Final diagnoses:  Dysuria     Rx / DC Orders   ED Discharge Orders          Ordered    levofloxacin (LEVAQUIN) 500 MG tablet  Daily        04/25/22 1815             Note:  This document was prepared using Dragon voice recognition  software and may include unintentional dictation errors.   Emeline Gins 04/25/22 2222    Vanessa [place], MD 04/26/22 1336

## 2022-04-25 NOTE — ED Triage Notes (Signed)
Arrives via [facility]:  from [facility].  C/o urinary retention x several days.  Patietn states pain with urination and only able to pass small dribbles at a time.  VS wnl -- HR:  P: 42-48  Also wound on Left inner thigh.

## 2022-04-25 NOTE — Discharge Instructions (Addendum)
Please take the antibiotics as prescribed.  Please follow-up with urology for possible prostatitis within the week as they may consider extending the antibiotic course.  Please return for any new, worsening, or change in symptoms or other concerns.  It was a pleasure caring for you today.

## 2022-04-25 NOTE — ED Notes (Signed)
Dc ppw provided. Followup and RX information reviewed as applicable. Pt declines vs at time of dc and all question addressed. Pt provides verbal consent for dc at this time. Attempted to call legal guardian. Care handoff with [facility] completed. Report to EMS crew complete.

## 2022-04-25 NOTE — ED Notes (Addendum)
Attempted to call legal guardian supervisor- Morrow-Jennings,Kailee.

## 2022-05-09 ENCOUNTER — Ambulatory Visit (INDEPENDENT_AMBULATORY_CARE_PROVIDER_SITE_OTHER): Payer: Medicare Other | Admitting: Urology

## 2022-05-09 ENCOUNTER — Encounter: Payer: Self-pay | Admitting: Urology

## 2022-05-09 VITALS — BP 112/64 | HR 65 | Wt 224.0 lb

## 2022-05-09 DIAGNOSIS — Z87448 Personal history of other diseases of urinary system: Secondary | ICD-10-CM

## 2022-05-09 LAB — BLADDER SCAN AMB NON-IMAGING

## 2022-05-09 NOTE — Patient Instructions (Signed)

## 2022-05-09 NOTE — Progress Notes (Signed)
05/09/2022 3:03 PM   Brian Dodson 11/24/1955 [id number]  Referring provider:  Galvin Proffer, MD [address] [place],  [place] [address] No chief complaint on file.   HPI: Brian Dodson is a 66 y.o.male with a personal history of CKD stage II, bilateral hydrocele scrotal pain, and bulbous urethral stricture who presents today for further evaluation of dysuria.   Notably, he does have urgency of bulbar urethral stricture disease status post DVIU back in 2016.  He had a cystoscopy subsequently at which time his urethra was gently dilated using the cystoscope in 2017 with Dr. Marlou Porch.  He  was last seen in clinic on 11/21/2018 with Dr Richardo Hanks. He was noted to have scrotal pain.   He was seen in the ED on 04/25/2022 and presented with sensation of incomplete bladder emptying. He was able to urinate approximately 300 cc into urinal. PVR showed 101 cc urine retained. UA was unremarkable. He was discharged on Levaquin.   He is doing well today. He is accompanied by health proxy he is a difficult historian and his caretaker questions if he was really havng an issues at this time.   PVR 21 ml .   PMH: Past Medical History:  Diagnosis Date   Anemia    Asthma    Atrial fibrillation (HCC)    Benign prostatic hypertrophy with lower urinary tract symptoms (LUTS)    Bulbous urethral stricture    CAD (coronary artery disease)    s/p coronary stent 2003   CKD (chronic kidney disease)    Constipation    COPD (chronic obstructive pulmonary disease) (HCC)    Diabetes mellitus without complication (HCC)    type 2   Dizziness    Dyspnea    GERD (gastroesophageal reflux disease)    Gross hematuria    Hyperlipemia    Hypertension    Lumbago    Palpitation    Schizophrenia (HCC)    Vascular dementia [facility])     Surgical History: Past Surgical History:  Procedure Laterality Date   CATARACT EXTRACTION W/PHACO Right 01/16/2022   Procedure: CATARACT EXTRACTION PHACO AND INTRAOCULAR  LENS PLACEMENT (IOC) RIGHT DIABETIC 4.48 00:34.3;  Surgeon: Galen Manila, MD;  Location: [facility];  Service: Ophthalmology;  Laterality: Right;  Diabetic   CATARACT EXTRACTION W/PHACO Left 03/13/2022   Procedure: CATARACT EXTRACTION PHACO AND INTRAOCULAR LENS PLACEMENT (IOC) LEFT DIABETIC malyugin 8.39 01:02.9 ;  Surgeon: Galen Manila, MD;  Location: [facility];  Service: Ophthalmology;  Laterality: Left;  Diabetic   COLONOSCOPY     COLONOSCOPY WITH PROPOFOL N/A 09/22/2021   Procedure: COLONOSCOPY WITH PROPOFOL;  Surgeon: Regis Bill, MD;  Location: [facility];  Service: Endoscopy;  Laterality: N/A;   COLONOSCOPY WITH PROPOFOL N/A 01/02/2022   Procedure: COLONOSCOPY WITH PROPOFOL;  Surgeon: Regis Bill, MD;  Location: [facility];  Service: Endoscopy;  Laterality: N/A;   CORONARY ANGIOPLASTY WITH STENT PLACEMENT  11/2003   kidney stent     TEE WITHOUT CARDIOVERSION N/A 09/20/2021   Procedure: TRANSESOPHAGEAL ECHOCARDIOGRAM (TEE);  Surgeon: Dalia Heading, MD;  Location: [facility];  Service: Cardiovascular;  Laterality: N/A;    Home Medications:  Allergies as of 05/09/2022       Reactions   Sulfa Antibiotics Other (See Comments)   Unknown reaction   Xarelto [rivaroxaban]         Medication List        Accurate as of May 09, 2022 11:59 PM. If you  have any questions, ask your nurse or doctor.          acetaminophen 500 MG tablet Commonly known as: TYLENOL Take 1,000 mg by mouth every 6 (six) hours as needed for mild pain.   amiodarone 200 MG tablet Commonly known as: PACERONE Take 1 tablet (200 mg total) by mouth daily.   aspirin 81 MG tablet Take 81 mg by mouth daily.   atorvastatin 10 MG tablet Commonly known as: LIPITOR Take 10 mg by mouth daily.   benztropine 1 MG tablet Commonly known as: COGENTIN Take 1 mg by mouth at bedtime.   Biofreeze 4 % Gel Generic drug: Menthol (Topical Analgesic) Apply 1 application  topically 3 (three) times daily. To lower back area as needed for pain   brimonidine-timolol 0.2-0.5 % ophthalmic solution Commonly known as: COMBIGAN Place 1 drop into both eyes every 12 (twelve) hours. Wait 3 to 5 minutes between drops   carvedilol 12.5 MG tablet Commonly known as: COREG Take 12.5 mg by mouth in the morning and at bedtime.   citalopram 20 MG tablet Commonly known as: CELEXA Take 20 mg by mouth daily.   diclofenac Sodium 1 % Gel Commonly known as: VOLTAREN Apply 2 g topically 4 (four) times daily as needed. Apply to neck and shoulders   Difluprednate 0.05 % Emul Apply to eye.   divalproex 500 MG 24 hr tablet Commonly known as: DEPAKOTE ER Take 2,000 mg by mouth at bedtime.   donepezil 10 MG tablet Commonly known as: ARICEPT Take 10 mg by mouth at bedtime.   Eliquis 5 MG Tabs tablet Generic drug: apixaban Take 5 mg by mouth 2 (two) times daily.   ferrous sulfate 325 (65 FE) MG tablet Take 325 mg by mouth daily with breakfast.   furosemide 20 MG tablet Commonly known as: LASIX Take 20 mg by mouth daily as needed. For edema (swelling)   Ilevro 0.3 % ophthalmic suspension Generic drug: nepafenac SMARTSIG:In Eye(s)   lanolin/mineral oil Lotn Apply 1 application topically as needed for dry skin. Apply to face after washing face daily   loratadine 10 MG tablet Commonly known as: CLARITIN Take 10 mg by mouth daily.   Lumigan 0.01 % Soln Generic drug: bimatoprost Place 1 drop into both eyes at bedtime.   Magnesium Oxide 400 MG Caps   melatonin 5 MG Tabs Take 5 mg by mouth at bedtime.   metFORMIN 500 MG tablet Commonly known as: GLUCOPHAGE Take 500 mg by mouth 2 (two) times daily with a meal.   moxifloxacin 0.5 % ophthalmic solution Commonly known as: VIGAMOX Apply to eye.   omeprazole 20 MG capsule Commonly known as: PRILOSEC Take 20 mg by mouth 2 (two) times daily before a meal. May open and sprinkle in applesauce.   oyster calcium 500  MG Tabs tablet Take 500 mg of elemental calcium by mouth 2 (two) times daily.   risperidone 4 MG tablet Commonly known as: RISPERDAL Take 4 mg by mouth at bedtime.   sodium chloride 0.65 % Soln nasal spray Commonly known as: OCEAN Place 1 spray into both nostrils daily.        Allergies:  Allergies  Allergen Reactions   Sulfa Antibiotics Other (See Comments)    Unknown reaction   Xarelto [Rivaroxaban]     Family History: Family History  Problem Relation Age of Onset   Diabetes Other    Bladder Cancer Neg Hx    Kidney cancer Neg Hx    Prostate cancer Neg  Hx     Social History:  reports that he has quit smoking. He has never used smokeless tobacco. He reports that he does not drink alcohol and does not use drugs.   Physical Exam: BP 112/64   Pulse 65   Wt 224 lb (101.6 kg)   BMI 29.15 kg/m   Constitutional:  Alert and oriented, No acute distress. HEENT: [place] AT, moist mucus membranes.  Trachea midline, no masses. Cardiovascular: No clubbing, cyanosis, or edema. Respiratory: Normal respiratory effort, no increased work of breathing. Skin: No rashes, bruises or suspicious lesions. Neurologic: Grossly intact, no focal deficits, moving all 4 extremities. Psychiatric: Normal mood and affect.  Laboratory Data:  Lab Results  Component Value Date   CREATININE 1.21 04/25/2022   Lab Results  Component Value Date   HGBA1C 6.0 (H) 09/17/2021   Pertinent Imaging: Results for orders placed or performed in visit on 05/09/22  BLADDER SCAN AMB NON-IMAGING  Result Value Ref Range   Scan Result 81ml     Assessment & Plan:   History of urethral stricture  -He is emptying adequately today with PVR of 21 ml.  - In light of his recent symptoms there is some concern of a recurrent urethral stricture.  - Recommend he undergo a cystoscopy to further evaluate and rule out urethral stricture. He is familiar with this procedure and he is agreeable with this plan.    Return for  cystoscopy   Tawni Millers as a scribe for Vanna Scotland, MD.,have documented all relevant documentation on the behalf of Vanna Scotland, MD,as directed by  Vanna Scotland, MD while in the presence of Vanna Scotland, MD.  I have reviewed the above documentation for accuracy and completeness, and I agree with the above.   Vanna Scotland, MD  [facility] [address] [place], [place] [address] [phone]

## 2022-06-05 ENCOUNTER — Ambulatory Visit (INDEPENDENT_AMBULATORY_CARE_PROVIDER_SITE_OTHER): Payer: Medicare Other | Admitting: Urology

## 2022-06-05 ENCOUNTER — Encounter: Payer: Self-pay | Admitting: Urology

## 2022-06-05 VITALS — BP 109/64 | HR 56 | Ht 73.5 in | Wt 224.0 lb

## 2022-06-05 DIAGNOSIS — Z87448 Personal history of other diseases of urinary system: Secondary | ICD-10-CM

## 2022-06-05 DIAGNOSIS — N35912 Unspecified bulbous urethral stricture, male: Secondary | ICD-10-CM

## 2022-06-05 NOTE — Addendum Note (Signed)
Addended by: Veneta Penton on: 06/05/2022 03:58 PM   Modules accepted: Orders

## 2022-06-05 NOTE — Progress Notes (Signed)
   06/05/22 CC:  Chief Complaint  Patient presents with   Cysto    HPI: Brian Dodson is a 66 y.o. male with a personal history of CKD stage II, bilateral hydrocele scrotal pain, and bulbous urethral stricture who presents today for a cystoscopy.   Notably, he does have urgency and history obulbar urethral stricture disease status post DVIU back in 2016.  He had a cystoscopy subsequently at which time his urethra was gently dilated using the cystoscope in 2017 with Dr. Marlou Porch.    Vitals:   06/05/22 1409  BP: 109/64  Pulse: (!) 56  NED. A&Ox3.   No respiratory distress   Abd soft, NT, ND Normal phallus with bilateral descended testicles  Cystoscopy Procedure Note Patient identification was confirmed, informed consent was obtained, and patient was prepped using Betadine solution.  Lidocaine jelly was administered per urethral meatus.     Pre-Procedure: - Inspection reveals a normal caliber ureteral meatus.  Procedure: The flexible cystoscope was introduced a bulbar urethral stricture non dense about  16 fr in diameter some debris in proximity but normal prostatic fossa - No urethral strictures/lesions are present. - Normal prostate  - Normal bladder neck - Bilateral ureteral orifices identified - Bladder mucosa  reveals no ulcers, tumors, or lesions - No bladder stones - No trabeculation   Post-Procedure: - Patient tolerated the procedure well   Assessment/ Plan:  Bulbar urethral stricture  -Appears relatively stable since cystoscopy back in 2015, symptoms are nonspecific and now seem to be bothersome.  As such, would not recommend any further intervention - Will manage conservatively  -Case was discussed with his healthcare power of attorney/proxy was present with him today, she agrees with this plan  F/u prn worsening urinary issues  I,Kailey Littlejohn,acting as a scribe for Vanna Scotland, MD.,have documented all relevant documentation on the behalf of Vanna Scotland, MD,as directed by  Vanna Scotland, MD while in the presence of Vanna Scotland, MD.  I have reviewed the above documentation for accuracy and completeness, and I agree with the above.   Vanna Scotland, MD

## 2022-06-14 ENCOUNTER — Encounter: Payer: Self-pay | Admitting: Emergency Medicine

## 2022-06-14 ENCOUNTER — Emergency Department: Payer: Medicare Other

## 2022-06-14 ENCOUNTER — Other Ambulatory Visit: Payer: Self-pay

## 2022-06-14 ENCOUNTER — Inpatient Hospital Stay
Admission: EM | Admit: 2022-06-14 | Discharge: 2022-06-18 | DRG: 603 | Disposition: A | Discharge status: Nursing Home (Includes ICF) | Payer: Medicare Other | Source: Skilled Nursing Facility | Attending: Internal Medicine | Admitting: Internal Medicine

## 2022-06-14 DIAGNOSIS — I2510 Atherosclerotic heart disease of native coronary artery without angina pectoris: Secondary | ICD-10-CM | POA: Diagnosis present

## 2022-06-14 DIAGNOSIS — J449 Chronic obstructive pulmonary disease, unspecified: Secondary | ICD-10-CM

## 2022-06-14 DIAGNOSIS — N182 Chronic kidney disease, stage 2 (mild): Secondary | ICD-10-CM

## 2022-06-14 DIAGNOSIS — E785 Hyperlipidemia, unspecified: Secondary | ICD-10-CM | POA: Diagnosis present

## 2022-06-14 DIAGNOSIS — L03116 Cellulitis of left lower limb: Secondary | ICD-10-CM | POA: Diagnosis present

## 2022-06-14 DIAGNOSIS — F203 Undifferentiated schizophrenia: Secondary | ICD-10-CM

## 2022-06-14 DIAGNOSIS — Z79899 Other long term (current) drug therapy: Secondary | ICD-10-CM

## 2022-06-14 DIAGNOSIS — M1712 Unilateral primary osteoarthritis, left knee: Secondary | ICD-10-CM | POA: Diagnosis present

## 2022-06-14 DIAGNOSIS — E1129 Type 2 diabetes mellitus with other diabetic kidney complication: Secondary | ICD-10-CM | POA: Diagnosis present

## 2022-06-14 DIAGNOSIS — D631 Anemia in chronic kidney disease: Secondary | ICD-10-CM | POA: Diagnosis present

## 2022-06-14 DIAGNOSIS — Z7984 Long term (current) use of oral hypoglycemic drugs: Secondary | ICD-10-CM | POA: Diagnosis not present

## 2022-06-14 DIAGNOSIS — Z833 Family history of diabetes mellitus: Secondary | ICD-10-CM

## 2022-06-14 DIAGNOSIS — F0153 Vascular dementia, unspecified severity, with mood disturbance: Secondary | ICD-10-CM | POA: Diagnosis present

## 2022-06-14 DIAGNOSIS — K219 Gastro-esophageal reflux disease without esophagitis: Secondary | ICD-10-CM | POA: Diagnosis present

## 2022-06-14 DIAGNOSIS — F209 Schizophrenia, unspecified: Secondary | ICD-10-CM | POA: Diagnosis present

## 2022-06-14 DIAGNOSIS — Z888 Allergy status to other drugs, medicaments and biological substances status: Secondary | ICD-10-CM | POA: Diagnosis not present

## 2022-06-14 DIAGNOSIS — Z7901 Long term (current) use of anticoagulants: Secondary | ICD-10-CM | POA: Diagnosis not present

## 2022-06-14 DIAGNOSIS — F32A Depression, unspecified: Secondary | ICD-10-CM | POA: Diagnosis present

## 2022-06-14 DIAGNOSIS — Z882 Allergy status to sulfonamides status: Secondary | ICD-10-CM | POA: Diagnosis not present

## 2022-06-14 DIAGNOSIS — D638 Anemia in other chronic diseases classified elsewhere: Secondary | ICD-10-CM | POA: Diagnosis not present

## 2022-06-14 DIAGNOSIS — Z87891 Personal history of nicotine dependence: Secondary | ICD-10-CM | POA: Diagnosis not present

## 2022-06-14 DIAGNOSIS — I480 Paroxysmal atrial fibrillation: Secondary | ICD-10-CM | POA: Diagnosis present

## 2022-06-14 DIAGNOSIS — Z955 Presence of coronary angioplasty implant and graft: Secondary | ICD-10-CM

## 2022-06-14 DIAGNOSIS — Z7982 Long term (current) use of aspirin: Secondary | ICD-10-CM | POA: Diagnosis not present

## 2022-06-14 DIAGNOSIS — I129 Hypertensive chronic kidney disease with stage 1 through stage 4 chronic kidney disease, or unspecified chronic kidney disease: Secondary | ICD-10-CM | POA: Diagnosis present

## 2022-06-14 DIAGNOSIS — I10 Essential (primary) hypertension: Secondary | ICD-10-CM | POA: Diagnosis not present

## 2022-06-14 DIAGNOSIS — E1122 Type 2 diabetes mellitus with diabetic chronic kidney disease: Secondary | ICD-10-CM | POA: Diagnosis present

## 2022-06-14 LAB — CBC WITH DIFFERENTIAL/PLATELET
Abs Immature Granulocytes: 0.17 10*3/uL — ABNORMAL HIGH (ref 0.00–0.07)
Basophils Absolute: 0 10*3/uL (ref 0.0–0.1)
Basophils Relative: 1 %
Eosinophils Absolute: 0.3 10*3/uL (ref 0.0–0.5)
Eosinophils Relative: 4 %
HCT: 33.5 % — ABNORMAL LOW (ref 39.0–52.0)
Hemoglobin: 10.2 g/dL — ABNORMAL LOW (ref 13.0–17.0)
Immature Granulocytes: 2 %
Lymphocytes Relative: 34 %
Lymphs Abs: 2.8 10*3/uL (ref 0.7–4.0)
MCH: 32 pg (ref 26.0–34.0)
MCHC: 30.4 g/dL (ref 30.0–36.0)
MCV: 105 fL — ABNORMAL HIGH (ref 80.0–100.0)
Monocytes Absolute: 0.8 10*3/uL (ref 0.1–1.0)
Monocytes Relative: 10 %
Neutro Abs: 4.1 10*3/uL (ref 1.7–7.7)
Neutrophils Relative %: 49 %
Platelets: 199 10*3/uL (ref 150–400)
RBC: 3.19 MIL/uL — ABNORMAL LOW (ref 4.22–5.81)
RDW: 13.5 % (ref 11.5–15.5)
WBC: 8.2 10*3/uL (ref 4.0–10.5)
nRBC: 0 % (ref 0.0–0.2)

## 2022-06-14 LAB — BASIC METABOLIC PANEL
Anion gap: 5 (ref 5–15)
BUN: 30 mg/dL — ABNORMAL HIGH (ref 8–23)
CO2: 29 mmol/L (ref 22–32)
Calcium: 9 mg/dL (ref 8.9–10.3)
Chloride: 105 mmol/L (ref 98–111)
Creatinine, Ser: 1.23 mg/dL (ref 0.61–1.24)
GFR, Estimated: >60 mL/min (ref >60)
Glucose, Bld: 76 mg/dL (ref 70–99)
Potassium: 4.6 mmol/L (ref 3.5–5.1)
Sodium: 139 mmol/L (ref 135–145)

## 2022-06-14 LAB — HEMOGLOBIN A1C
Hgb A1c MFr Bld: 6.1 % — ABNORMAL HIGH (ref 4.8–5.6)
Mean Plasma Glucose: 128.37 mg/dL

## 2022-06-14 LAB — URINALYSIS, ROUTINE W REFLEX MICROSCOPIC
Bilirubin Urine: NEGATIVE
Glucose, UA: NEGATIVE mg/dL
Hgb urine dipstick: NEGATIVE
Ketones, ur: NEGATIVE mg/dL
Leukocytes,Ua: NEGATIVE
Nitrite: NEGATIVE
Protein, ur: NEGATIVE mg/dL
Specific Gravity, Urine: 1.013 (ref 1.005–1.030)
pH: 5 (ref 5.0–8.0)

## 2022-06-14 LAB — GLUCOSE, CAPILLARY
Glucose-Capillary: 73 mg/dL (ref 70–99)
Glucose-Capillary: 131 mg/dL — ABNORMAL HIGH (ref 70–99)

## 2022-06-14 LAB — C-REACTIVE PROTEIN: CRP: 12.1 mg/dL — ABNORMAL HIGH (ref <1.0)

## 2022-06-14 LAB — LACTIC ACID, PLASMA: Lactic Acid, Venous: 1.6 mmol/L (ref 0.5–1.9)

## 2022-06-14 LAB — MRSA NEXT GEN BY PCR, NASAL: MRSA by PCR Next Gen: DETECTED — AB

## 2022-06-14 LAB — HIV ANTIBODY (ROUTINE TESTING W REFLEX): HIV Screen 4th Generation wRfx: NONREACTIVE

## 2022-06-14 LAB — SEDIMENTATION RATE: Sed Rate: 64 mm/hr — ABNORMAL HIGH (ref 0–20)

## 2022-06-14 MED ORDER — FERROUS SULFATE 325 (65 FE) MG PO TABS
325.0000 mg | ORAL_TABLET | Freq: Every day | ORAL | Status: DC
  Administered 2022-06-15 – 2022-06-18 (×4): 325 mg via ORAL
  Filled 2022-06-14 (×4): qty 1

## 2022-06-14 MED ORDER — THERA-DERM EX LOTN: 1.0000 | TOPICAL_LOTION | CUTANEOUS | Status: DC | PRN

## 2022-06-14 MED ORDER — SODIUM CHLORIDE 0.9 % IV SOLN
2.0000 g | INTRAVENOUS | Status: DC
  Administered 2022-06-14 – 2022-06-16 (×3): 2 g via INTRAVENOUS
  Filled 2022-06-14: qty 2
  Filled 2022-06-14: qty 20
  Filled 2022-06-14: qty 2
  Filled 2022-06-14: qty 20

## 2022-06-14 MED ORDER — VANCOMYCIN HCL 1250 MG/250ML IV SOLN
1250.0000 mg | Freq: Once | INTRAVENOUS | Status: AC
  Administered 2022-06-14: 1250 mg via INTRAVENOUS
  Filled 2022-06-14: qty 250

## 2022-06-14 MED ORDER — SALINE SPRAY 0.65 % NA SOLN
1.0000 | Freq: Every day | NASAL | Status: DC
Start: 2022-06-15 — End: 2022-06-18
  Administered 2022-06-15 – 2022-06-18 (×4): 1 via NASAL
  Filled 2022-06-14: qty 44

## 2022-06-14 MED ORDER — ASPIRIN 81 MG PO TBEC
81.0000 mg | DELAYED_RELEASE_TABLET | Freq: Every day | ORAL | Status: DC
  Administered 2022-06-15 – 2022-06-18 (×4): 81 mg via ORAL
  Filled 2022-06-14 (×4): qty 1

## 2022-06-14 MED ORDER — DONEPEZIL HCL 5 MG PO TABS
10.0000 mg | ORAL_TABLET | Freq: Every day | ORAL | Status: DC
  Administered 2022-06-14 – 2022-06-17 (×4): 10 mg via ORAL
  Filled 2022-06-14 (×4): qty 2

## 2022-06-14 MED ORDER — CHLORHEXIDINE GLUCONATE CLOTH 2 % EX PADS
6.0000 | MEDICATED_PAD | Freq: Every day | CUTANEOUS | Status: DC
  Administered 2022-06-15 – 2022-06-18 (×3): 6 via TOPICAL

## 2022-06-14 MED ORDER — VANCOMYCIN HCL 2000 MG/400ML IV SOLN: 2000.0000 mg | INTRAVENOUS | Status: DC

## 2022-06-14 MED ORDER — OXYCODONE-ACETAMINOPHEN 5-325 MG PO TABS: 1.0000 | ORAL_TABLET | ORAL | Status: DC | PRN

## 2022-06-14 MED ORDER — ZINC OXIDE 40 % EX OINT: 1.0000 | TOPICAL_OINTMENT | Freq: Two times a day (BID) | CUTANEOUS | Status: DC | PRN

## 2022-06-14 MED ORDER — MAGNESIUM OXIDE 400 MG PO TABS
400.0000 mg | ORAL_TABLET | Freq: Every day | ORAL | Status: DC
  Administered 2022-06-15 – 2022-06-18 (×4): 400 mg via ORAL
  Filled 2022-06-14 (×8): qty 1

## 2022-06-14 MED ORDER — TIMOLOL MALEATE 0.5 % OP SOLN
1.0000 [drp] | Freq: Two times a day (BID) | OPHTHALMIC | Status: DC
  Administered 2022-06-14 – 2022-06-18 (×8): 1 [drp] via OPHTHALMIC
  Filled 2022-06-14: qty 5

## 2022-06-14 MED ORDER — CARVEDILOL 6.25 MG PO TABS
12.5000 mg | ORAL_TABLET | Freq: Two times a day (BID) | ORAL | Status: DC
  Administered 2022-06-15 – 2022-06-18 (×7): 12.5 mg via ORAL
  Filled 2022-06-14 (×7): qty 2

## 2022-06-14 MED ORDER — HYDRALAZINE HCL 20 MG/ML IJ SOLN
5.0000 mg | INTRAMUSCULAR | Status: DC | PRN
Start: 2022-06-14 — End: 2022-06-18

## 2022-06-14 MED ORDER — SODIUM CHLORIDE 0.9 % IV SOLN: INTRAVENOUS | Status: DC | PRN

## 2022-06-14 MED ORDER — LATANOPROST 0.005 % OP SOLN
1.0000 [drp] | Freq: Every day | OPHTHALMIC | Status: DC
  Administered 2022-06-14 – 2022-06-17 (×4): 1 [drp] via OPHTHALMIC
  Filled 2022-06-14: qty 2.5

## 2022-06-14 MED ORDER — VANCOMYCIN HCL IN DEXTROSE 1-5 GM/200ML-% IV SOLN
1000.0000 mg | Freq: Once | INTRAVENOUS | Status: AC
Start: 2022-06-14 — End: 2022-06-14
  Administered 2022-06-14: 1000 mg via INTRAVENOUS
  Filled 2022-06-14: qty 200

## 2022-06-14 MED ORDER — APIXABAN 5 MG PO TABS
5.0000 mg | ORAL_TABLET | Freq: Two times a day (BID) | ORAL | Status: DC
  Administered 2022-06-14 – 2022-06-18 (×8): 5 mg via ORAL
  Filled 2022-06-14 (×8): qty 1

## 2022-06-14 MED ORDER — INSULIN ASPART 100 UNIT/ML IJ SOLN: 0.0000 [IU] | Freq: Every day | INTRAMUSCULAR | Status: DC

## 2022-06-14 MED ORDER — CALCIUM CARBONATE 1250 (500 CA) MG PO TABS
1250.0000 mg | ORAL_TABLET | Freq: Two times a day (BID) | ORAL | Status: DC
  Administered 2022-06-15 – 2022-06-18 (×7): 1250 mg via ORAL
  Filled 2022-06-14 (×8): qty 1

## 2022-06-14 MED ORDER — ONDANSETRON HCL 4 MG/2ML IJ SOLN: 4.0000 mg | Freq: Three times a day (TID) | INTRAMUSCULAR | Status: DC | PRN

## 2022-06-14 MED ORDER — ATORVASTATIN CALCIUM 20 MG PO TABS
10.0000 mg | ORAL_TABLET | Freq: Every day | ORAL | Status: DC
  Administered 2022-06-15 – 2022-06-18 (×4): 10 mg via ORAL
  Filled 2022-06-14 (×4): qty 1

## 2022-06-14 MED ORDER — AMIODARONE HCL 200 MG PO TABS
200.0000 mg | ORAL_TABLET | Freq: Every day | ORAL | Status: DC
  Administered 2022-06-15 – 2022-06-18 (×4): 200 mg via ORAL
  Filled 2022-06-14 (×4): qty 1

## 2022-06-14 MED ORDER — MUPIROCIN 2 % EX OINT
1.0000 | TOPICAL_OINTMENT | Freq: Two times a day (BID) | CUTANEOUS | Status: DC
  Administered 2022-06-14 – 2022-06-18 (×8): 1 via NASAL
  Filled 2022-06-14: qty 22

## 2022-06-14 MED ORDER — INSULIN ASPART 100 UNIT/ML IJ SOLN
0.0000 [IU] | Freq: Three times a day (TID) | INTRAMUSCULAR | Status: DC
  Administered 2022-06-15 – 2022-06-16 (×2): 1 [IU] via SUBCUTANEOUS
  Administered 2022-06-17: 3 [IU] via SUBCUTANEOUS
  Administered 2022-06-17: 1 [IU] via SUBCUTANEOUS
  Filled 2022-06-14 (×4): qty 1

## 2022-06-14 MED ORDER — PANTOPRAZOLE SODIUM 40 MG PO TBEC
40.0000 mg | DELAYED_RELEASE_TABLET | Freq: Every day | ORAL | Status: DC
  Administered 2022-06-15 – 2022-06-18 (×4): 40 mg via ORAL
  Filled 2022-06-14 (×4): qty 1

## 2022-06-14 MED ORDER — BRIMONIDINE TARTRATE-TIMOLOL 0.2-0.5 % OP SOLN: 1.0000 [drp] | Freq: Two times a day (BID) | OPHTHALMIC | Status: DC

## 2022-06-14 MED ORDER — RISPERIDONE 1 MG PO TABS
4.0000 mg | ORAL_TABLET | Freq: Every day | ORAL | Status: DC
  Administered 2022-06-14 – 2022-06-16 (×3): 4 mg via ORAL
  Filled 2022-06-14 (×5): qty 4

## 2022-06-14 MED ORDER — BENZTROPINE MESYLATE 1 MG PO TABS
1.0000 mg | ORAL_TABLET | Freq: Every day | ORAL | Status: DC
  Administered 2022-06-14 – 2022-06-17 (×4): 1 mg via ORAL
  Filled 2022-06-14 (×4): qty 1

## 2022-06-14 MED ORDER — CITALOPRAM HYDROBROMIDE 20 MG PO TABS
20.0000 mg | ORAL_TABLET | Freq: Every day | ORAL | Status: DC
  Administered 2022-06-15 – 2022-06-18 (×4): 20 mg via ORAL
  Filled 2022-06-14 (×4): qty 1

## 2022-06-14 MED ORDER — ACETAMINOPHEN 325 MG PO TABS: 650.0000 mg | ORAL_TABLET | Freq: Four times a day (QID) | ORAL | Status: DC | PRN

## 2022-06-14 MED ORDER — BRIMONIDINE TARTRATE 0.2 % OP SOLN
1.0000 [drp] | Freq: Two times a day (BID) | OPHTHALMIC | Status: DC
  Administered 2022-06-14 – 2022-06-18 (×8): 1 [drp] via OPHTHALMIC
  Filled 2022-06-14: qty 5

## 2022-06-14 MED ORDER — DIVALPROEX SODIUM ER 250 MG PO TB24
1000.0000 mg | ORAL_TABLET | Freq: Two times a day (BID) | ORAL | Status: DC
  Administered 2022-06-14 – 2022-06-18 (×8): 1000 mg via ORAL
  Filled 2022-06-14 (×8): qty 4

## 2022-06-14 MED ORDER — MELATONIN 5 MG PO TABS
5.0000 mg | ORAL_TABLET | Freq: Every day | ORAL | Status: DC
  Administered 2022-06-14 – 2022-06-17 (×4): 5 mg via ORAL
  Filled 2022-06-14 (×4): qty 1

## 2022-06-14 MED ORDER — ALBUTEROL SULFATE (2.5 MG/3ML) 0.083% IN NEBU: 3.0000 mL | INHALATION_SOLUTION | RESPIRATORY_TRACT | Status: DC | PRN

## 2022-06-14 MED ORDER — LORATADINE 10 MG PO TABS
10.0000 mg | ORAL_TABLET | Freq: Every day | ORAL | Status: DC
  Administered 2022-06-15 – 2022-06-18 (×4): 10 mg via ORAL
  Filled 2022-06-14 (×4): qty 1

## 2022-06-14 MED ORDER — DM-GUAIFENESIN ER 30-600 MG PO TB12: 1.0000 | ORAL_TABLET | Freq: Two times a day (BID) | ORAL | Status: DC | PRN

## 2022-06-14 NOTE — Assessment & Plan Note (Addendum)
Depression and schizophrenia:  Appear stable, calm demeanor, no apparent hallucinations noted. --Continue home meds: risperidone, Depakote, Celexa, benztropine

## 2022-06-14 NOTE — ED Notes (Addendum)
See triage note  Presents from [facility] with swelling to left knee/lower leg Left knee has a wound  then developed 3+ edema

## 2022-06-14 NOTE — Plan of Care (Signed)
  Problem: Activity: Goal: Risk for activity intolerance will decrease Outcome: Progressing   Problem: Coping: Goal: Level of anxiety will decrease Outcome: Progressing   Problem: Safety: Goal: Ability to remain free from injury will improve Outcome: Progressing   

## 2022-06-14 NOTE — ED Notes (Signed)
Pt here via [facility] with pitting edema in his left leg. Pt has wound on his left knee that is wrapped in gauze.    68 130/86 98% RA

## 2022-06-14 NOTE — ED Provider Notes (Signed)
[facility] Provider Note    Event Date/Time   First MD Initiated Contact with Patient 06/14/22 1322     (approximate)  History   Chief Complaint: Leg Swelling  HPI  JAUN Brian Dodson is a 66 y.o. male with a past medical history of asthma, atrial fibrillation, CKD, COPD, hypertension, hyperlipidemia, diabetes, presents to the emergency department for left leg swelling.  Patient is coming from a group home with complaints of increased swelling to the left leg.  Patient has a wound just above the left knee he is not sure how long it has been there but states the leg just swelled like this today.  Patient appears to be on Eliquis per record review.  Patient is a poor historian, awaiting guardian/family member for further history.  Physical Exam   Triage Vital Signs: ED Triage Vitals  Enc Vitals Group     BP 06/14/22 1224 (!) 125/96     Pulse Rate 06/14/22 1224 61     Resp 06/14/22 1224 14     Temp 06/14/22 1224 97.9 F (36.6 C)     Temp Source 06/14/22 1224 Oral     SpO2 06/14/22 1224 92 %     Weight 06/14/22 1322 223 lb 15.8 oz (101.6 kg)     Height 06/14/22 1322 6' 1.5" (1.867 m)     Head Circumference --      Peak Flow --      Pain Score 06/14/22 1243 0     Pain Loc --      Pain Edu? --      Excl. in GC? --     Most recent vital signs: Vitals:   06/14/22 1224  BP: (!) 125/96  Pulse: 61  Resp: 14  Temp: 97.9 F (36.6 C)  SpO2: 92%    General: Awake, no distress.  Very pleasant, calm and cooperative. CV:  Good peripheral perfusion.  Regular rate and rhythm  Resp:  Normal effort.  Equal breath sounds bilaterally.  Abd:  No distention.  Soft, nontender.  No rebound or guarding. Other:  Patient does have a wound above the left knee measuring approximately 1.5 cm in diameter with mild weepage.  Patient has significant swelling of the left lower extremity and no edema in the right lower extremity.  Left lower extremity is erythematous.   ED  Results / Procedures / Treatments   RADIOLOGY  I have reviewed and interpreted the knee x-ray images I do not see any obvious bony abnormality or fracture. Radiology has read the x-ray is soft tissue swelling with no acute osseous abnormality. Ultrasound negative for DVT  MEDICATIONS ORDERED IN ED: Medications  vancomycin (VANCOCIN) IVPB 1000 mg/200 mL premix (has no administration in time range)     IMPRESSION / MDM / ASSESSMENT AND PLAN / ED COURSE  I reviewed the triage vital signs and the nursing notes.  Patient's presentation is most consistent with acute presentation with potential threat to life or bodily function.  Patient presents to the emergency department for redness and swelling of the left lower extremity.  Poor historian unclear of the timeline of swelling awaiting further history from family or guardian.  Patient is very pleasant.  Given the significant swelling of the lower extremity highly suspicious for cellulitis versus DVT we will obtain an ultrasound to evaluate.  Given the degree of swelling with the open wound and weepage we will start the patient on IV antibiotics we will send blood cultures and a  lactic acid.  Basic lab work shows an overall reassuring chemistry and normal CBC with normal white blood cell count.  Lactic acid of 1.6.  Ultrasound negative for DVT.  X-ray negative for bony abnormality.  Given the degree of swelling and erythema to the leg highly suspect cellulitis.  We will start the patient on IV vancomycin and admit to the hospitalist service.  Patient agreeable to plan of care.  FINAL CLINICAL IMPRESSION(S) / ED DIAGNOSES   Cellulitis left lower extremity   Note:  This document was prepared using Dragon voice recognition software and may include unintentional dictation errors.   Minna Antis, MD 06/14/22 [phone]

## 2022-06-14 NOTE — Assessment & Plan Note (Addendum)
Recent A1c 6.0, well controlled.   Takes metformin - resume at discharge. Primary Care follow up.

## 2022-06-14 NOTE — H&P (Signed)
History and Physical    Brian Dodson:[id number] DOB: 18-Oct-1956 DOA: 06/14/2022  Referring MD/NP/PA:   PCP: [name], MD   Patient coming from:  The patient is coming from group home.    Chief Complaint: pain and swelling in left leg  HPI: Brian Dodson is a 66 y.o. male with medical history significant of HTN, HLD, COPD, CKD-2, CAD, BPH, A fib on Eliquis, anemia, GERD, depression, vascular dementia, who presents with pain and swelling in the left leg.  Pt states that he has left leg pain and swelling for several days, which has been progressively worsening.  His left lower leg is erythematous.  He also has a small open wound in the left knee cap. The pain is constant, sharp, moderate, nonradiating.  Patient has chills, but no fever.  Denies chest pain, cough, shortness breath.  No nausea, vomiting, diarrhea or abdominal pain.  No symptoms of UTI.  Of note, patient was hospitalized from 3/27 - 3/30 due to abscess and cellulitis in left posterior thigh.  Patient had I&D in that admission.  Data reviewed independently and ED Course: pt was found to have WBC 8.2, lactic acid 1.6, negative urinalysis, GFR> 60, temperature normal, blood pressure 125/96, heart rate 61, RR 14, oxygen saturation 92-95% % on room air.  Left leg venous Doppler is negative for DVT, does showed a Baker's cyst.  X-ray of the left knee is negative for bony fracture.  Patient is admitted to telemetry bed as inpatient.  X-ray of left knee: Anterior soft tissue swelling. No acute osseous abnormality or significant joint effusion.   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   EKG: Not done in ED, will get one.   Review of Systems:   General: no fevers, chills, no body weight gain, fatigue HEENT: no blurry vision, hearing changes or sore throat Respiratory: no dyspnea, coughing, wheezing CV: no chest pain, no palpitations GI: no nausea, vomiting, abdominal pain, diarrhea,  constipation GU: no dysuria, burning on urination, increased urinary frequency, hematuria  Ext: has left leg edema and pain Neuro: no unilateral weakness, numbness, or tingling, no vision change or hearing loss Skin: has a small wound in left knee cap MSK: No muscle spasm, no deformity, no limitation of range of movement in spin Heme: No easy bruising.  Travel history: No recent long distant travel.   Allergy:  Allergies  Allergen Reactions   Sulfa Antibiotics Other (See Comments)    Unknown reaction   Xarelto [Rivaroxaban]     Past Medical History:  Diagnosis Date   Anemia    Asthma    Atrial fibrillation ([place])    Benign prostatic hypertrophy with lower urinary tract symptoms (LUTS)    Bulbous urethral stricture    CAD (coronary artery disease)    s/p coronary stent 2003   CKD (chronic kidney disease)    Constipation    COPD (chronic obstructive pulmonary disease) (HCC)    Diabetes mellitus without complication (HCC)    type 2   Dizziness    Dyspnea    GERD (gastroesophageal reflux disease)    Gross hematuria    Hyperlipemia    Hypertension    Lumbago    Palpitation    Schizophrenia ([place])    Vascular dementia [facility])     Past Surgical History:  Procedure Laterality Date   CATARACT EXTRACTION W/PHACO Right 01/16/2022   Procedure: CATARACT EXTRACTION PHACO AND INTRAOCULAR LENS PLACEMENT (IOC) RIGHT DIABETIC 4.48 00:34.3;  Surgeon: Birder Robson,  MD;  Location: [place];  Service: Ophthalmology;  Laterality: Right;  Diabetic   CATARACT EXTRACTION W/PHACO Left 03/13/2022   Procedure: CATARACT EXTRACTION PHACO AND INTRAOCULAR LENS PLACEMENT (IOC) LEFT DIABETIC malyugin 8.39 01:02.9 ;  Surgeon: Birder Robson, MD;  Location: [place];  Service: Ophthalmology;  Laterality: Left;  Diabetic   COLONOSCOPY     COLONOSCOPY WITH PROPOFOL N/A 09/22/2021   Procedure: COLONOSCOPY WITH PROPOFOL;  Surgeon: Lesly Rubenstein, MD;  Location: [facility];   Service: Endoscopy;  Laterality: N/A;   COLONOSCOPY WITH PROPOFOL N/A 01/02/2022   Procedure: COLONOSCOPY WITH PROPOFOL;  Surgeon: Lesly Rubenstein, MD;  Location: [facility];  Service: Endoscopy;  Laterality: N/A;   CORONARY ANGIOPLASTY WITH STENT PLACEMENT  11/2003   kidney stent     TEE WITHOUT CARDIOVERSION N/A 09/20/2021   Procedure: TRANSESOPHAGEAL ECHOCARDIOGRAM (TEE);  Surgeon: Teodoro Spray, MD;  Location: [facility] OR[facility];  Service: Cardiovascular;  Laterality: N/A;    Social History:  reports that he has quit smoking. He has never used smokeless tobacco. He reports that he does not drink alcohol and does not use drugs.  Family History:  Family History  Problem Relation Age of Onset   Diabetes Other    Bladder Cancer Neg Hx    Kidney cancer Neg Hx    Prostate cancer Neg Hx      Prior to Admission medications   Medication Sig Start Date End Date Taking? Authorizing Provider  acetaminophen (TYLENOL) 500 MG tablet Take 1,000 mg by mouth every 6 (six) hours as needed for mild pain.    [provider]  amiodarone (PACERONE) 200 MG tablet Take 1 tablet (200 mg total) by mouth daily. 07/26/21   Wyvonnia Dusky, MD  aspirin 81 MG tablet Take 81 mg by mouth daily.     [provider]  atorvastatin (LIPITOR) 10 MG tablet Take 10 mg by mouth daily. 05/18/15   [provider]  benztropine (COGENTIN) 1 MG tablet Take 1 mg by mouth at bedtime.     [provider]  brimonidine-timolol (COMBIGAN) 0.2-0.5 % ophthalmic solution Place 1 drop into both eyes every 12 (twelve) hours. Wait 3 to 5 minutes between drops    [provider]  carvedilol (COREG) 12.5 MG tablet Take 12.5 mg by mouth in the morning and at bedtime.    [provider]  citalopram (CELEXA) 20 MG tablet Take 20 mg by mouth daily.    [provider]  diclofenac Sodium (VOLTAREN) 1 % GEL Apply 2 g topically 4 (four) times daily as needed. Apply to neck and shoulders  08/18/21   Jennye Boroughs, MD  Difluprednate 0.05 % EMUL Apply to eye. 04/10/22   [provider]  divalproex (DEPAKOTE ER) 500 MG 24 hr tablet Take 2,000 mg by mouth at bedtime.     [provider]  donepezil (ARICEPT) 10 MG tablet Take 10 mg by mouth at bedtime.     [provider]  ELIQUIS 5 MG TABS tablet Take 5 mg by mouth 2 (two) times daily. 05/18/15   [provider]  ferrous sulfate 325 (65 FE) MG tablet Take 325 mg by mouth daily with breakfast.    [provider]  furosemide (LASIX) 20 MG tablet Take 20 mg by mouth daily as needed. For edema (swelling) 06/22/21   [provider]  ILEVRO 0.3 % ophthalmic suspension SMARTSIG:In Eye(s) 04/02/22   [provider]  lanolin/mineral oil (KERI/THERA-DERM) LOTN Apply 1  application topically as needed for dry skin. Apply to face after washing face daily    [provider]  loratadine (CLARITIN) 10 MG tablet Take 10 mg by mouth daily. 05/01/22   [provider]  LUMIGAN 0.01 % SOLN Place 1 drop into both eyes at bedtime. 07/06/21   [provider]  Magnesium Oxide 400 MG CAPS  11/30/21   [provider]  melatonin 5 MG TABS Take 5 mg by mouth at bedtime.    [provider]  Menthol, Topical Analgesic, (BIOFREEZE) 4 % GEL Apply 1 application topically 3 (three) times daily. To lower back area as needed for pain    [provider]  metFORMIN (GLUCOPHAGE) 500 MG tablet Take 500 mg by mouth 2 (two) times daily with a meal.    [provider]  moxifloxacin (VIGAMOX) 0.5 % ophthalmic solution Apply to eye. 04/17/22   [provider]  omeprazole (PRILOSEC) 20 MG capsule Take 20 mg by mouth 2 (two) times daily before a meal. May open and sprinkle in applesauce.    [provider]  Loma [place] (OYSTER CALCIUM) 500 MG TABS tablet Take 500 mg of elemental calcium by mouth 2 (two) times daily.    [provider]   risperidone (RISPERDAL) 4 MG tablet Take 4 mg by mouth at bedtime. 05/18/15   [provider]  sodium chloride (OCEAN) 0.65 % SOLN nasal spray Place 1 spray into both nostrils daily.    [provider]    Physical Exam: Vitals:   06/14/22 1224 06/14/22 1322 06/14/22 1636 06/14/22 1652  BP: (!) 125/96  (!) 125/92   Pulse: 61  69   Resp: 14  20   Temp: 97.9 F (36.6 C)   98 F (36.7 C)  TempSrc: Oral   Oral  SpO2: 92%  99%   Weight:  101.6 kg    Height:  6' 1.5" (1.867 m)     General: Not in acute distress HEENT:       Eyes: PERRL, EOMI, no scleral icterus.       ENT: No discharge from the ears and nose, no pharynx injection, no tonsillar enlargement.        Neck: No JVD, no bruit, no mass felt. Heme: No neck lymph node enlargement. Cardiac: S1/S2, RRR, No murmurs, No gallops or rubs. Respiratory: No rales, wheezing, rhonchi or rubs. GI: Soft, nondistended, nontender, no rebound pain, no organomegaly, BS present. GU: No hematuria Ext: Has swelling, erythema, tenderness and warmth in left lower leg.  Has a small open wound in left knee.         Musculoskeletal: No joint deformities, No joint redness or warmth, no limitation of ROM in spin. Skin: No rashes.  Neuro: Alert, oriented X3, cranial nerves II-XII grossly intact, moves all extremities normally. Psych: Patient is not psychotic, no suicidal or hemocidal ideation.  Labs on Admission: I have personally reviewed following labs and imaging studies  CBC: Recent Labs  Lab 06/14/22 1255  WBC 8.2  NEUTROABS 4.1  HGB 10.2*  HCT 33.5*  MCV 105.0*  PLT 935   Basic Metabolic Panel: Recent Labs  Lab 06/14/22 1255  NA 139  K 4.6  CL 105  CO2 29  GLUCOSE 76  BUN 30*  CREATININE 1.23  CALCIUM 9.0   GFR: Estimated Creatinine Clearance: 74.6 mL/min (by C-G formula based on SCr of 1.23 mg/dL). Liver Function Tests: No results for input(s): "AST", "ALT", "ALKPHOS", "BILITOT", "PROT", "ALBUMIN"  in  the last 168 hours. No results for input(s): "LIPASE", "AMYLASE" in the last 168 hours. No results for input(s): "AMMONIA" in the last 168 hours. Coagulation Profile: No results for input(s): "INR", "PROTIME" in the last 168 hours. Cardiac Enzymes: No results for input(s): "CKTOTAL", "CKMB", "CKMBINDEX", "TROPONINI" in the last 168 hours. BNP (last 3 results) No results for input(s): "PROBNP" in the last 8760 hours. HbA1C: No results for input(s): "HGBA1C" in the last 72 hours. CBG: No results for input(s): "GLUCAP" in the last 168 hours. Lipid Profile: No results for input(s): "CHOL", "HDL", "LDLCALC", "TRIG", "CHOLHDL", "LDLDIRECT" in the last 72 hours. Thyroid Function Tests: No results for input(s): "TSH", "T4TOTAL", "FREET4", "T3FREE", "THYROIDAB" in the last 72 hours. Anemia Panel: No results for input(s): "VITAMINB12", "FOLATE", "FERRITIN", "TIBC", "IRON", "RETICCTPCT" in the last 72 hours. Urine analysis:    Component Value Date/Time   COLORURINE YELLOW (A) 06/14/2022 1255   APPEARANCEUR CLEAR (A) 06/14/2022 1255   APPEARANCEUR Clear 03/14/2016 1027   LABSPEC 1.013 06/14/2022 1255   LABSPEC 1.017 01/29/2014 1545   PHURINE 5.0 06/14/2022 1255   GLUCOSEU NEGATIVE 06/14/2022 1255   GLUCOSEU Negative 01/29/2014 1545   HGBUR NEGATIVE 06/14/2022 1255   BILIRUBINUR NEGATIVE 06/14/2022 1255   BILIRUBINUR Negative 03/14/2016 1027   BILIRUBINUR Negative 01/29/2014 [place] 06/14/2022 1255   PROTEINUR NEGATIVE 06/14/2022 1255   NITRITE NEGATIVE 06/14/2022 1255   LEUKOCYTESUR NEGATIVE 06/14/2022 1255   LEUKOCYTESUR Negative 01/29/2014 1545   Sepsis Labs: _0 (procalcitonin:4,lacticidven:4) )No results found for this or any previous visit (from the past 240 hour(s)).   Radiological Exams on Admission: US Venous Img Lower Unilateral Left  Result Date: 06/14/2022 CLINICAL DATA:  Red and swollen LEFT lower extremity EXAM: LEFT LOWER EXTREMITY VENOUS  DOPPLER ULTRASOUND TECHNIQUE: Gray-scale sonography with compression, as well as color and duplex ultrasound, were performed to evaluate the deep venous system(s) from the level of the common femoral vein through the popliteal and proximal calf veins. COMPARISON:  None Available. FINDINGS: VENOUS Normal compressibility of the common femoral, superficial femoral, and popliteal veins, as well as the visualized calf veins. Visualized portions of profunda femoral vein and great saphenous vein unremarkable. No filling defects to suggest DVT on grayscale or color Doppler imaging. Doppler waveforms show normal direction of venous flow, normal respiratory plasticity and response to augmentation. Limited views of the contralateral common femoral vein are unremarkable. OTHER Fluid collection the popliteal fossa measures 2.4 x 1.0 x 2.0 cm. Limitations: none IMPRESSION: 1. No evidence of deep venous thrombosis LEFT lower extremity. 2. Small fluid collection popliteal fossa suggests Baker cyst. Electronically Signed   By: Suzy Bouchard M.D.   On: 06/14/2022 15:13   DG Knee Complete 4 Views Left  Result Date: 06/14/2022 CLINICAL DATA:  knee wound EXAM: LEFT KNEE - COMPLETE 4+ VIEW COMPARISON:  CT 02/05/2022 FINDINGS: There is no acute osseous abnormality. No significant joint effusion. 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 There is soft tissue swelling anteriorly with bandage material noted. IMPRESSION: Anterior soft tissue swelling. No acute osseous abnormality or significant joint effusion. 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 Electronically Signed   By: Maurine Simmering M.D.   On: 06/14/2022 13:48      Assessment/Plan Principal Problem:   Cellulitis of left lower extremity Active Problems:   CAD (coronary artery disease)   HTN (hypertension)   CKD (chronic kidney disease) stage 2, GFR 60-89 ml/min   COPD (chronic obstructive  pulmonary disease) (  [place])   Anemia of chronic disease   HLD (hyperlipidemia)   PAF (paroxysmal atrial fibrillation) (HCC)   Schizophrenia ([place])   Depression   Type II diabetes mellitus with renal manifestations ([place])   Assessment and Plan: * Cellulitis of left lower extremity Patient does not have sepsis on admission.  Left leg is severely swollen.  Lower extremity venous Dopplers negative for DVT in left leg.  - Admitted to MedSurg bed as inpatient - Empiric antimicrobial treatment with vancomycin andRocephin - PRN Zofran for nausea, and tylenol and Percocet for pain - Blood cultures x 2  - ESR and CRP - wound care consult for small wound in left knee cap   CAD (coronary artery disease) No CP -ASA, lipitor   HTN (hypertension) -IV hydralazine as needed -Coreg  CKD (chronic kidney disease) stage 2, GFR 60-89 ml/min Stable, GFR> 60. -Follow-up with BMP  COPD (chronic obstructive pulmonary disease) (HCC) Stable -As needed albuterol  Anemia of chronic disease Hemoglobin 10.2 -Follow-up with CBC  HLD (hyperlipidemia) - Lipitor  PAF (paroxysmal atrial fibrillation) (HCC) Heart rate is 61 - Continue Coreg, amiodarone -Continue Eliquis  Depression Depression and schizophrenia: Patient is calm today -Risperidone Depakote, Celexa, benztropine  Schizophrenia (HCC) -Risperidone, Depakote, Celexa, benztropine  Type II diabetes mellitus with renal manifestations (HCC) Recent A1c 6.0, well controlled.  Patient is taking metformin at home -Sliding scale insulin          DVT ppx: Eliquis  Code Status: Full code  Family Communication: not done, no family member is at bed side.   Disposition Plan:  Anticipate discharge back to previous environment, group home  Consults called:  none  Admission status and Level of care: Telemetry Medical:    as inpt     Severity of Illness:  The appropriate patient status for this patient is INPATIENT. Inpatient status  is judged to be reasonable and necessary in order to provide the required intensity of service to ensure the patient's safety. The patient's presenting symptoms, physical exam findings, and initial radiographic and laboratory data in the context of their chronic comorbidities is felt to place them at high risk for further clinical deterioration. Furthermore, it is not anticipated that the patient will be medically stable for discharge from the hospital within 2 midnights of admission.   * I certify that at the point of admission it is my clinical judgment that the patient will require inpatient hospital care spanning beyond 2 midnights from the point of admission due to high intensity of service, high risk for further deterioration and high frequency of surveillance required.*       Date of Service 06/14/2022    Ivor Costa [facility]   If 7PM-7AM, please contact night-coverage [email or web address] 06/14/2022, 5:20 PM

## 2022-06-14 NOTE — Assessment & Plan Note (Addendum)
Stable, no signs of exacerbation. --PRN albuterol

## 2022-06-14 NOTE — Assessment & Plan Note (Addendum)
Hemoglobin 10.2 on admission, stable --Monitor CBC

## 2022-06-14 NOTE — Assessment & Plan Note (Signed)
Stable, GFR> 60 -Follow-up with BMP 

## 2022-06-14 NOTE — ED Notes (Signed)
Attempted to call guardian  No answer  Did speak to his niece Rufina Falco  she will be coming up here to be with pt

## 2022-06-14 NOTE — Progress Notes (Signed)
Pharmacy Antibiotic Note  Brian Dodson is a 66 y.o. male admitted on 06/14/2022 with cellulitis. Past medical history includes CKD2, asthma, Afib, COPD, HTN, HLD, DM. Presented with left leg swelling and redness. Pharmacy has been consulted for vancomycin dosing.  Renal function near baseline. Received vancomycin 1,000 mg dose at ~1400.  Plan: Vancomycin 1250 mg now to complete 2250 mg loading dose  Vancomycin 2,000 mg every 24 hours maintenance dose Goal AUC 400-600 Estimated AUC 445.2, Cmin 9.2  Also on ceftriaxone 2 grams every 24 hours  Height: 6' 1.5" (186.7 cm) Weight: 101.6 kg (223 lb 15.8 oz) IBW/kg (Calculated) : 81.05  Temp (24hrs), Avg:97.9 F (36.6 C), Min:97.9 F (36.6 C), Max:97.9 F (36.6 C)  Recent Labs  Lab 06/14/22 1255 06/14/22 1624  WBC 8.2  --   CREATININE 1.23  --   LATICACIDVEN  --  1.6    Estimated Creatinine Clearance: 74.6 mL/min (by C-G formula based on SCr of 1.23 mg/dL).    Allergies  Allergen Reactions   Sulfa Antibiotics Other (See Comments)    Unknown reaction   Xarelto [Rivaroxaban]     Antimicrobials this admission: vancomycin 7/27 >>  ceftriaxone 7/27 >>   Dose adjustments this admission: N/a  Microbiology results: 7/27 BCx: in process  Thank you for allowing pharmacy to be a part of this patient's care.  Jaynie Bream 06/14/2022 3:59 PM

## 2022-06-14 NOTE — Assessment & Plan Note (Addendum)
HR controlled.   - Continue Coreg, amiodarone -Continue Eliquis -Resume tele if tachycardic or palpitations

## 2022-06-14 NOTE — Assessment & Plan Note (Addendum)
Did not meet sepsis criteria on admission.   Presented with severely swollen and erythematous LLE with anterior left knee wound.   No drain-able fluid collection seen on xray. --Continue IV vancomycin and ceftriaxone --Supportive care PRN Zofran for nausea, and tylenol and Percocet for pain --Follow blood cultures --Wound care consulted, appreciate recommendations (see 7/28 WOC note and orders) --Mobilize as tolerated

## 2022-06-14 NOTE — Assessment & Plan Note (Signed)
-  IV hydralazine as needed -Coreg

## 2022-06-14 NOTE — ED Triage Notes (Signed)
Pt reports left leg swelling. Pt has wound on his knee cap with erythema surrounding the wound. Denies fevers. Pt reports the wound is "new." Unsure of cause per patient.

## 2022-06-14 NOTE — Assessment & Plan Note (Addendum)
Stable, no active chest pain. --continue ASA, lipitor --stat EKG and troponin if chest pain

## 2022-06-14 NOTE — Assessment & Plan Note (Addendum)
continue Lipitor

## 2022-06-14 NOTE — Assessment & Plan Note (Signed)
-  Risperidone, Depakote, Celexa, benztropine

## 2022-06-15 DIAGNOSIS — L03116 Cellulitis of left lower limb: Secondary | ICD-10-CM | POA: Diagnosis not present

## 2022-06-15 LAB — BASIC METABOLIC PANEL
Anion gap: 7 (ref 5–15)
BUN: 23 mg/dL (ref 8–23)
CO2: 29 mmol/L (ref 22–32)
Calcium: 9.1 mg/dL (ref 8.9–10.3)
Chloride: 105 mmol/L (ref 98–111)
Creatinine, Ser: 0.93 mg/dL (ref 0.61–1.24)
GFR, Estimated: >60 mL/min (ref >60)
Glucose, Bld: 81 mg/dL (ref 70–99)
Potassium: 4.7 mmol/L (ref 3.5–5.1)
Sodium: 141 mmol/L (ref 135–145)

## 2022-06-15 LAB — CBC
HCT: 32.9 % — ABNORMAL LOW (ref 39.0–52.0)
Hemoglobin: 10.6 g/dL — ABNORMAL LOW (ref 13.0–17.0)
MCH: 32.5 pg (ref 26.0–34.0)
MCHC: 32.2 g/dL (ref 30.0–36.0)
MCV: 100.9 fL — ABNORMAL HIGH (ref 80.0–100.0)
Platelets: 210 10*3/uL (ref 150–400)
RBC: 3.26 MIL/uL — ABNORMAL LOW (ref 4.22–5.81)
RDW: 13.1 % (ref 11.5–15.5)
WBC: 6.4 10*3/uL (ref 4.0–10.5)
nRBC: 0 % (ref 0.0–0.2)

## 2022-06-15 LAB — GLUCOSE, CAPILLARY
Glucose-Capillary: 73 mg/dL (ref 70–99)
Glucose-Capillary: 109 mg/dL — ABNORMAL HIGH (ref 70–99)
Glucose-Capillary: 126 mg/dL — ABNORMAL HIGH (ref 70–99)
Glucose-Capillary: 130 mg/dL — ABNORMAL HIGH (ref 70–99)
Glucose-Capillary: 152 mg/dL — ABNORMAL HIGH (ref 70–99)

## 2022-06-15 MED ORDER — VANCOMYCIN HCL 1250 MG/250ML IV SOLN
1250.0000 mg | Freq: Two times a day (BID) | INTRAVENOUS | Status: DC
  Filled 2022-06-15: qty 250

## 2022-06-15 MED ORDER — MEDIHONEY WOUND/BURN DRESSING EX PSTE
1.0000 | PASTE | Freq: Every day | CUTANEOUS | Status: DC
Start: 2022-06-15 — End: 2022-06-18
  Administered 2022-06-15 – 2022-06-18 (×4): 1 via TOPICAL
  Filled 2022-06-15: qty 44
  Filled 2022-06-15: qty 1

## 2022-06-15 MED ORDER — VANCOMYCIN HCL 1500 MG/300ML IV SOLN
1500.0000 mg | Freq: Two times a day (BID) | INTRAVENOUS | Status: DC
  Administered 2022-06-15 – 2022-06-17 (×4): 1500 mg via INTRAVENOUS
  Filled 2022-06-15 (×5): qty 300

## 2022-06-15 NOTE — Progress Notes (Signed)
Pharmacy Antibiotic Note  Brian Dodson is a 66 y.o. male admitted on 06/14/2022 with cellulitis. Past medical history includes CKD2, asthma, Afib, COPD, HTN, HLD, DM. Presented with left leg swelling and redness. Pharmacy has been consulted for vancomycin dosing.  Renal function near baseline. Received vancomycin 1,000 mg dose at ~1400.  Plan:  *Renal function improved ~20% from  * Vancomycin maintenance dose re-calculated Vancomycin 2250 mg loading dose given 7/27 at 1630 Vancomycin 1500 mg IV Q 12 hrs.  Goal AUC 400-550. Expected AUC: 520 Estimated Cmin 14.7 SCr used: 0.93  Also on ceftriaxone 2 grams every 24 hours  Height: 6' 1.5" (186.7 cm) Weight: 101.6 kg (223 lb 15.8 oz) IBW/kg (Calculated) : 81.05  Temp (24hrs), Avg:97.9 F (36.6 C), Min:97.4 F (36.3 C), Max:98.7 F (37.1 C)  Recent Labs  Lab 06/14/22 1255 06/14/22 1624 06/15/22 0640  WBC 8.2  --  6.4  CREATININE 1.23  --  0.93  LATICACIDVEN  --  1.6  --      Estimated Creatinine Clearance: 98.7 mL/min (by C-G formula based on SCr of 0.93 mg/dL).    Allergies  Allergen Reactions   Sulfa Antibiotics Other (See Comments)    Unknown reaction   Xarelto [Rivaroxaban]     Antimicrobials this admission: vancomycin 7/27 >>  ceftriaxone 7/27 >>   Dose adjustments this admission: N/a  Microbiology results: 7/27 BCx: in process  Thank you for allowing pharmacy to be a part of this patient's care.  Narayan Scull Rodriguez-Guzman PharmD, BCPS 06/15/2022 11:33 AM

## 2022-06-15 NOTE — Progress Notes (Signed)
Progress Note   Patient: Brian Dodson DOB: 30-Nov-1955 DOA: 06/14/2022     1 DOS: the patient was seen and examined on 06/15/2022   Brief hospital course:  Brian Dodson is a 66 y.o. male with medical history significant of HTN, HLD, COPD, CKD-2, CAD, BPH, A fib on Eliquis, anemia, GERD, depression, vascular dementia, who presented on 06/14/2022 for evaluation of worsening pain and swelling in the left leg for several days.  There was a small open wound on the left knee cap noted, and extensive swelling and erythema of the left knee and lower leg.  Prior history of cellulitis with abscess of the left posterior thigh which required I&D and hospital admission for IV antibiotics in March.    Patient was admitted and started on IV vancomycin and ceftriaxone.  Duplex U/S of LLE was negative for DVT, showed a Baker's cyst.  X-ray left knee negative for fracture or significant effusion, showed only anterior soft tissue swelling.  Assessment and Plan: * Cellulitis of left lower extremity Did not meet sepsis criteria on admission.   Presented with severely swollen and erythematous LLE with anterior left knee wound.   No drain-able fluid collection seen on xray. --Continue IV vancomycin and ceftriaxone --Supportive care PRN Zofran for nausea, and tylenol and Percocet for pain --Follow blood cultures --Wound care consulted, appreciate recommendations (see 7/28 WOC note and orders) --Mobilize as tolerated  CAD (coronary artery disease) Stable, no active chest pain. --continue ASA, lipitor --stat EKG and troponin if chest pain  HTN (hypertension) -IV hydralazine as needed -Coreg  CKD (chronic kidney disease) stage 2, GFR 60-89 ml/min Stable, GFR> 60. -Follow-up with BMP  COPD (chronic obstructive pulmonary disease) (HCC) Stable, no signs of exacerbation. --PRN albuterol  Anemia of chronic disease Hemoglobin 10.2 on admission, stable --Monitor CBC  HLD (hyperlipidemia) --  continue Lipitor  PAF (paroxysmal atrial fibrillation) (HCC) HR controlled.   - Continue Coreg, amiodarone -Continue Eliquis -Resume tele if tachycardic or palpitations  Depression Depression and schizophrenia:  Appear stable, calm demeanor, no apparent hallucinations noted. --Continue home meds: risperidone, Depakote, Celexa, benztropine  Schizophrenia (HCC) -Risperidone, Depakote, Celexa, benztropine  Type II diabetes mellitus with renal manifestations (HCC) Recent A1c 6.0, well controlled.   Takes metformin - hold. --Sliding scale Novolog        Subjective: Pt seen, sitting up in chair today on rounds.  He reports feeling a little better today.  Says knee swelling has gone down some.  The lower leg is still very swollen but he feels slightly improved also.  Denies fever/chills or other complaints.   Physical Exam: Vitals:   06/15/22 0520 06/15/22 0759 06/15/22 1149 06/15/22 1550  BP: (!) 158/97 133/85 108/84 124/63  Pulse: 70 73 (!) 48 (!) 56  Resp: 16 18    Temp: (!) 97.4 F (36.3 C) 97.6 F (36.4 C) 98.5 F (36.9 C) 97.6 F (36.4 C)  TempSrc:      SpO2: 100% 97% 100% 100%  Weight:      Height:       General exam: awake, alert, no acute distress, pleasant HEENT: atraumatic, clear conjunctiva, anicteric sclera, moist mucus membranes, hearing grossly normal  Respiratory system: CTAB, no wheezes, rales or rhonchi, normal respiratory effort. Cardiovascular system: normal S1/S2, RRR, no murmurs heard Central nervous system: no gross focal neurologic deficits, normal speech Extremities: left knee with intact dressing over wound, distal left lower extremity tensely edematous, minimal differential warmth of LLE compare to RLE Skin:  dry, intact, normal temperature, lef Psychiatry: normal mood, congruent affect, no apparent AH or VH, calm demeanor   Data Reviewed:  Notable labs --  Hbg 10.6 stable .  MRSA PCR screen positive.  ESR 64, CRP 12.1  Family  Communication: none  Disposition: Status is: Inpatient Remains inpatient appropriate because: severity of illness remaining of IV antibiotics for cellulitis until demonstrating further clinical improvement.     Planned Discharge Destination:  return to group home    Time spent: 40 minutes  Author: Ezekiel Slocumb, DO 06/15/2022 6:51 PM  For on call review [email or web address].

## 2022-06-15 NOTE — Plan of Care (Signed)
  Problem: Coping: Goal: Ability to adjust to condition or change in health will improve Outcome: Progressing   Problem: Fluid Volume: Goal: Ability to maintain a balanced intake and output will improve Outcome: Progressing   Problem: Health Behavior/Discharge Planning: Goal: Ability to identify and utilize available resources and services will improve Outcome: Progressing   

## 2022-06-15 NOTE — Hospital Course (Signed)
KAHARI CRITZER is a 66 y.o. male with medical history significant of HTN, HLD, COPD, CKD-2, CAD, BPH, A fib on Eliquis, anemia, GERD, depression, vascular dementia, who presented on 06/14/2022 for evaluation of worsening pain and swelling in the left leg for several days.  There was a small open wound on the left knee cap noted, and extensive swelling and erythema of the left knee and lower leg.  Prior history of cellulitis with abscess of the left posterior thigh which required I&D and hospital admission for IV antibiotics in March.    Patient was admitted and started on IV vancomycin and ceftriaxone.  Duplex U/S of LLE was negative for DVT, showed a Baker's cyst.  X-ray left knee negative for fracture or significant effusion, showed only anterior soft tissue swelling.   7/31: Pt doing well, continues to improve on oral antibiotics. Medically stable for discharge. Recommend wound care clinic follow up.

## 2022-06-15 NOTE — Progress Notes (Signed)
Entered pt's room to see IV pump had been shut off. Pt states "It just cut off " but also states he cut if off. IV antibiotic, Vancomycin, restarted for completion. IV pump panel locked. Rocephin timed for administration following completion of full vancomycin dosage.

## 2022-06-15 NOTE — Consult Note (Signed)
WOC Nurse Consult Note: Reason for Consult: knee wound, history of leg pain, swelling in this leg previously.  Xray negative  Wound type: full thickness ulceration left knee, unclear etilogy Infectious ?  Pressure Injury POA: NA Measurement:see nursing flowsheet  Wound bed:100% yellow Drainage (amount, consistency, odor) see nursing flow sheet Periwound: edema  Dressing procedure/placement/frequency: Add Medihoney for topical care; autolytic debridement and antimicrobial Top with dry dressing and silicone foam dressing Reapply Medihoney daily and change foam every 3 days.   Re consult if needed, will not follow at this time. Thanks  Jeanluc Wegman [employer], RN,CWOCN, CNS, CWON-AP [phone])

## 2022-06-16 DIAGNOSIS — L03116 Cellulitis of left lower limb: Secondary | ICD-10-CM | POA: Diagnosis not present

## 2022-06-16 LAB — GLUCOSE, CAPILLARY
Glucose-Capillary: 84 mg/dL (ref 70–99)
Glucose-Capillary: 120 mg/dL — ABNORMAL HIGH (ref 70–99)
Glucose-Capillary: 142 mg/dL — ABNORMAL HIGH (ref 70–99)
Glucose-Capillary: 139 mg/dL — ABNORMAL HIGH (ref 70–99)

## 2022-06-16 NOTE — Progress Notes (Signed)
   06/16/22 1400  Clinical Encounter Type  Visited With Patient  Visit Type Initial;Spiritual support  Referral From Nurse  Consult/Referral To Chaplain   Chaplain responded to [facility] consult to provide support, encouragement and conversation.

## 2022-06-16 NOTE — Progress Notes (Signed)
Progress Note   Patient: Brian Dodson YHC:[id number] DOB: 1955-12-23 DOA: 06/14/2022     2 DOS: the patient was seen and examined on 06/16/2022   Brief hospital course:  Brian Dodson is a 66 y.o. male with medical history significant of HTN, HLD, COPD, CKD-2, CAD, BPH, A fib on Eliquis, anemia, GERD, depression, vascular dementia, who presented on 06/14/2022 for evaluation of worsening pain and swelling in the left leg for several days.  There was a small open wound on the left knee cap noted, and extensive swelling and erythema of the left knee and lower leg.  Prior history of cellulitis with abscess of the left posterior thigh which required I&D and hospital admission for IV antibiotics in March.    Patient was admitted and started on IV vancomycin and ceftriaxone.  Duplex U/S of LLE was negative for DVT, showed a Baker's cyst.  X-ray left knee negative for fracture or significant effusion, showed only anterior soft tissue swelling.  Assessment and Plan: * Cellulitis of left lower extremity Did not meet sepsis criteria on admission.   Presented with severely swollen and erythematous LLE with anterior left knee wound.   No drain-able fluid collection seen on xray.  7/29: left lower leg remains severely swollen and warm  --Continue IV vancomycin and ceftriaxone until further improvement on exam --Transition to PO antibiotics in 24-48 hours anticipated --Supportive care PRN Zofran for nausea, and tylenol and Percocet for pain --Follow blood cultures --Wound care consulted, appreciate recommendations (see 7/28 WOC note and orders) --Mobilize as tolerated  CAD (coronary artery disease) Stable, no active chest pain. --continue ASA, lipitor --stat EKG and troponin if chest pain  HTN (hypertension) -IV hydralazine as needed -Coreg  CKD (chronic kidney disease) stage 2, GFR 60-89 ml/min Stable, GFR> 60. -Follow-up with BMP  COPD (chronic obstructive pulmonary disease) (HCC) Stable, no  signs of exacerbation. --PRN albuterol  Anemia of chronic disease Hemoglobin 10.2 on admission, stable --Monitor CBC  HLD (hyperlipidemia) -- continue Lipitor  PAF (paroxysmal atrial fibrillation) (HCC) HR controlled.   - Continue Coreg, amiodarone -Continue Eliquis -Resume tele if tachycardic or palpitations  Depression Depression and schizophrenia:  Appear stable, calm demeanor, no apparent hallucinations noted. --Continue home meds: risperidone, Depakote, Celexa, benztropine  Schizophrenia (HCC) -Risperidone, Depakote, Celexa, benztropine  Type II diabetes mellitus with renal manifestations (HCC) Recent A1c 6.0, well controlled.   Takes metformin - hold. --Sliding scale Novolog        Subjective: Pt was getting ready to go walk on the unit when seen on rounds.  He reports feeling better.  Has some pain in the knee and leg but feels it's better.  Still has a lot of left lower leg swelling.  He says it feels tight, but able to walk okay.  No acute events reported.    Physical Exam: Vitals:   06/15/22 2128 06/16/22 0614 06/16/22 0804 06/16/22 1550  BP: 102/62 140/83 107/67 109/62  Pulse: (!) 52 (!) 52 (!) 50 (!) 47  Resp: 16 18  17   Temp: 98.2 F (36.8 C) 98 F (36.7 C) 97.9 F (36.6 C) 98.1 F (36.7 C)  TempSrc: Oral     SpO2: 97% 100% 100% 100%  Weight:      Height:       General exam: awake, alert, no acute distress, pleasant HEENT: moist mucus membranes, hearing grossly normal  Respiratory system: on room air, normal respiratory effort. Cardiovascular system: brisk cap refill, RRR Central nervous system: no gross  focal neurologic deficits, normal speech Extremities: left knee with intact dressing over wound, distal left lower extremity remains with tense edema Psychiatry: normal mood, congruent affect, no apparent AH or VH, calm demeanor   Data Reviewed: No new labs today. CBG's 84 >> 120 >> 142  Family Communication: none  Disposition: Status  is: Inpatient Remains inpatient appropriate because: severity of illness remaining of IV antibiotics for cellulitis until demonstrating further clinical improvement.     Planned Discharge Destination:  return to group home    Time spent: 40 minutes  Author: Pennie Banter, DO 06/16/2022 5:42 PM  For on call review [email or web address].

## 2022-06-17 DIAGNOSIS — L03116 Cellulitis of left lower limb: Secondary | ICD-10-CM | POA: Diagnosis not present

## 2022-06-17 LAB — BASIC METABOLIC PANEL
Anion gap: 8 (ref 5–15)
BUN: 19 mg/dL (ref 8–23)
CO2: 26 mmol/L (ref 22–32)
Calcium: 8.9 mg/dL (ref 8.9–10.3)
Chloride: 103 mmol/L (ref 98–111)
Creatinine, Ser: 1.04 mg/dL (ref 0.61–1.24)
GFR, Estimated: >60 mL/min (ref >60)
Glucose, Bld: 99 mg/dL (ref 70–99)
Potassium: 4.8 mmol/L (ref 3.5–5.1)
Sodium: 137 mmol/L (ref 135–145)

## 2022-06-17 LAB — GLUCOSE, CAPILLARY
Glucose-Capillary: 131 mg/dL — ABNORMAL HIGH (ref 70–99)
Glucose-Capillary: 89 mg/dL (ref 70–99)
Glucose-Capillary: 230 mg/dL — ABNORMAL HIGH (ref 70–99)
Glucose-Capillary: 156 mg/dL — ABNORMAL HIGH (ref 70–99)

## 2022-06-17 MED ORDER — CEFDINIR 300 MG PO CAPS
300.0000 mg | ORAL_CAPSULE | Freq: Two times a day (BID) | ORAL | Status: DC
  Administered 2022-06-17 – 2022-06-18 (×3): 300 mg via ORAL
  Filled 2022-06-17 (×3): qty 1

## 2022-06-17 MED ORDER — DOXYCYCLINE HYCLATE 100 MG PO TABS
100.0000 mg | ORAL_TABLET | Freq: Two times a day (BID) | ORAL | Status: DC
Start: 2022-06-17 — End: 2022-06-18
  Administered 2022-06-17 – 2022-06-18 (×3): 100 mg via ORAL
  Filled 2022-06-17 (×3): qty 1

## 2022-06-17 MED ORDER — RISPERIDONE 0.5 MG PO TABS
4.0000 mg | ORAL_TABLET | Freq: Every day | ORAL | Status: DC
  Administered 2022-06-17: 4 mg via ORAL
  Filled 2022-06-17 (×3): qty 2

## 2022-06-17 NOTE — TOC Progression Note (Signed)
Transition of Care [facility]) - Progression Note    Patient Details  Name: Brian Dodson MRN: [id number] Date of Birth: 09-Sep-1956  Transition of Care [facility]) CM/SW Contact  Bing Quarry, RN Phone Number: 06/17/2022, 9:43 AM  Clinical Narrative: EDD 7/31 back to [facility] [address] [place], [place]. [phone] (new number). Left VM regarding patient's likely discharge and return to facility with call back number for RN CM today. Gabriel Cirri RN CM            Expected Discharge Plan and Services                                                 Social Determinants of Health (SDOH) Interventions    Readmission Risk Interventions    02/14/2022    1:19 PM 09/19/2021    3:16 PM  Readmission Risk Prevention Plan  Transportation Screening Complete Complete  PCP or Specialist Appt within 3-5 Days  Complete  HRI or Home Care Consult  Complete  Social Work Consult for Recovery Care Planning/Counseling  Complete  Palliative Care Screening  Not Applicable  Medication Review [employer])  Complete  HRI or Home Care Consult Complete   SW Recovery Care/Counseling Consult Complete   Palliative Care Screening Not Applicable   Skilled Nursing Facility Not Applicable

## 2022-06-17 NOTE — Progress Notes (Signed)
Progress Note   Patient: Brian Dodson IWP:[id number] DOB: 02-08-1956 DOA: 06/14/2022     3 DOS: the patient was seen and examined on 06/17/2022   Brief hospital course:  ENIS RIECKE is a 66 y.o. male with medical history significant of HTN, HLD, COPD, CKD-2, CAD, BPH, A fib on Eliquis, anemia, GERD, depression, vascular dementia, who presented on 06/14/2022 for evaluation of worsening pain and swelling in the left leg for several days.  There was a small open wound on the left knee cap noted, and extensive swelling and erythema of the left knee and lower leg.  Prior history of cellulitis with abscess of the left posterior thigh which required I&D and hospital admission for IV antibiotics in March.    Patient was admitted and started on IV vancomycin and ceftriaxone.  Duplex U/S of LLE was negative for DVT, showed a Baker's cyst.  X-ray left knee negative for fracture or significant effusion, showed only anterior soft tissue swelling.  Assessment and Plan: * Cellulitis of left lower extremity Did not meet sepsis criteria on admission.   Presented with severely swollen and erythematous LLE with anterior left knee wound.   No drain-able fluid collection seen on xray.  7/30: left lower leg remains severely swollen, warmth improved  --Treated with empiric IV vancomycin and ceftriaxone  --Transition to PO doxycycline and cefdinir --Supportive care PRN Zofran for nausea, and tylenol and Percocet for pain --Follow blood cultures --Wound care consulted, appreciate recommendations (see 7/28 WOC note and orders) --Mobilize as tolerated --Elevate left leg when in bed or recliner --Wrap left lower leg to help swelling  CAD (coronary artery disease) Stable, no active chest pain. --continue ASA, lipitor --stat EKG and troponin if chest pain  HTN (hypertension) -IV hydralazine as needed -Coreg  CKD (chronic kidney disease) stage 2, GFR 60-89 ml/min Stable, GFR> 60. -Follow-up with BMP  COPD  (chronic obstructive pulmonary disease) (HCC) Stable, no signs of exacerbation. --PRN albuterol  Anemia of chronic disease Hemoglobin 10.2 on admission, stable --Monitor CBC  HLD (hyperlipidemia) -- continue Lipitor  PAF (paroxysmal atrial fibrillation) (HCC) HR controlled.   - Continue Coreg, amiodarone -Continue Eliquis -Resume tele if tachycardic or palpitations  Depression Depression and schizophrenia:  Appear stable, calm demeanor, no apparent hallucinations noted. --Continue home meds: risperidone, Depakote, Celexa, benztropine  Schizophrenia (HCC) -Risperidone, Depakote, Celexa, benztropine  Type II diabetes mellitus with renal manifestations (HCC) Recent A1c 6.0, well controlled.   Takes metformin - hold. --Sliding scale Novolog        Subjective: Pt was getting blood sugar checked when seen on rounds.  He is in his usual good spirits, cheerful.  Reports ongoing left leg pain mostly due to swelling.  Otherwise feeling okay.  Some soreness in the knee wound but improved.  Dressing was just changed.  No acute events reported.  Physical Exam: Vitals:   06/16/22 2007 06/17/22 0618 06/17/22 0835 06/17/22 1654  BP: (!) 133/92 108/84 (!) 120/97 124/90  Pulse: 96 (!) 108 82 84  Resp: 16 16 18 18   Temp: 98.3 F (36.8 C) (!) 97.5 F (36.4 C) 97.9 F (36.6 C) (!) 97.5 F (36.4 C)  TempSrc: Oral Oral    SpO2: 99% 100% 90%   Weight:      Height:       General exam: awake, alert, no acute distress, pleasant HEENT: moist mucus membranes, hearing grossly normal  Respiratory system: on room air, normal respiratory effort. Cardiovascular system: brisk cap refill, RRR Central  nervous system: no gross focal neurologic deficits, normal speech Extremities: left knee with intact dressing over wound, distal left lower extremity remains with tense edema Psychiatry: normal mood, congruent affect, no apparent AH or VH, calm demeanor   Data Reviewed: No new labs  today. CBG's 131 >> 89 >> 230  Family Communication: none  Disposition: Status is: Inpatient Remains inpatient appropriate because: severity of illness closely monitor for further improvement and stability x 24 hours on PO antibiotics.  Anticipate d/c back to group home tomorrow.     Planned Discharge Destination:  return to group home    Time spent: 35 minutes  Author: Pennie Banter, DO 06/17/2022 5:50 PM  For on call review [email or web address].

## 2022-06-18 DIAGNOSIS — L03116 Cellulitis of left lower limb: Secondary | ICD-10-CM | POA: Diagnosis not present

## 2022-06-18 LAB — GLUCOSE, CAPILLARY
Glucose-Capillary: 94 mg/dL (ref 70–99)
Glucose-Capillary: 124 mg/dL — ABNORMAL HIGH (ref 70–99)

## 2022-06-18 MED ORDER — CEFDINIR 300 MG PO CAPS: 300.0000 mg | ORAL_CAPSULE | Freq: Two times a day (BID) | ORAL | 0 refills | Status: DC

## 2022-06-18 MED ORDER — FERROUS SULFATE 325 (65 FE) MG PO TABS: 325.0000 mg | ORAL_TABLET | Freq: Every day | ORAL | 3 refills | Status: DC

## 2022-06-18 MED ORDER — DOXYCYCLINE HYCLATE 100 MG PO TABS: 100.0000 mg | ORAL_TABLET | Freq: Two times a day (BID) | ORAL | 0 refills | Status: DC

## 2022-06-18 MED ORDER — MEDIHONEY WOUND/BURN DRESSING EX PSTE: 1.0000 | PASTE | Freq: Every day | CUTANEOUS | 0 refills | Status: DC

## 2022-06-18 MED ORDER — MAGNESIUM OXIDE 400 MG PO CAPS: 400.0000 mg | ORAL_CAPSULE | Freq: Every day | ORAL | 0 refills | Status: DC

## 2022-06-18 MED ORDER — DIVALPROEX SODIUM ER 500 MG PO TB24: 1000.0000 mg | ORAL_TABLET | Freq: Two times a day (BID) | ORAL | 1 refills | Status: DC

## 2022-06-18 MED ORDER — CEFDINIR 300 MG PO CAPS: 300.0000 mg | ORAL_CAPSULE | Freq: Two times a day (BID) | ORAL | 0 refills | Status: AC

## 2022-06-18 MED ORDER — DOXYCYCLINE HYCLATE 100 MG PO TABS: 100.0000 mg | ORAL_TABLET | Freq: Two times a day (BID) | ORAL | 0 refills | Status: AC

## 2022-06-18 NOTE — Progress Notes (Signed)
Patient discharged to [facility] with Legal Guardian, Elsie Lincoln. A+Ox3. VSS. Dressing changed to Left Knee per orders. No complaints. Medications and discharge instructions reviewed. All questions answered. PIV removed, no bleeding, intact. Patient satisfied with overall care at [facility].

## 2022-06-18 NOTE — Plan of Care (Signed)
Problem: Education: Goal: Ability to describe self-care measures that may prevent or decrease complications (Diabetes Survival Skills Education) will improve 06/18/2022 1218 by Dillard Essex, RN Outcome: Completed/Met 06/18/2022 1116 by Dillard Essex, RN Outcome: Progressing Goal: Individualized Educational Video(s) 06/18/2022 1218 by Dillard Essex, RN Outcome: Completed/Met 06/18/2022 1116 by Dillard Essex, RN Outcome: Progressing   Problem: Coping: Goal: Ability to adjust to condition or change in health will improve 06/18/2022 1218 by Dillard Essex, RN Outcome: Completed/Met 06/18/2022 1116 by Dillard Essex, RN Outcome: Progressing   Problem: Fluid Volume: Goal: Ability to maintain a balanced intake and output will improve 06/18/2022 1218 by Dillard Essex, RN Outcome: Completed/Met 06/18/2022 1116 by Dillard Essex, RN Outcome: Progressing   Problem: Health Behavior/Discharge Planning: Goal: Ability to identify and utilize available resources and services will improve 06/18/2022 1218 by Dillard Essex, RN Outcome: Completed/Met 06/18/2022 1116 by Dillard Essex, RN Outcome: Progressing Goal: Ability to manage health-related needs will improve 06/18/2022 1218 by Dillard Essex, RN Outcome: Completed/Met 06/18/2022 1116 by Dillard Essex, RN Outcome: Progressing   Problem: Metabolic: Goal: Ability to maintain appropriate glucose levels will improve 06/18/2022 1218 by Dillard Essex, RN Outcome: Completed/Met 06/18/2022 1116 by Dillard Essex, RN Outcome: Progressing   Problem: Nutritional: Goal: Maintenance of adequate nutrition will improve 06/18/2022 1218 by Dillard Essex, RN Outcome: Completed/Met 06/18/2022 1116 by Dillard Essex, RN Outcome: Progressing Goal: Progress toward achieving an optimal weight will improve 06/18/2022 1218 by Dillard Essex, RN Outcome: Completed/Met 06/18/2022 1116 by Dillard Essex, RN Outcome:  Progressing   Problem: Skin Integrity: Goal: Risk for impaired skin integrity will decrease 06/18/2022 1218 by Dillard Essex, RN Outcome: Completed/Met 06/18/2022 1116 by Dillard Essex, RN Outcome: Progressing   Problem: Tissue Perfusion: Goal: Adequacy of tissue perfusion will improve 06/18/2022 1218 by Dillard Essex, RN Outcome: Completed/Met 06/18/2022 1116 by Dillard Essex, RN Outcome: Progressing   Problem: Education: Goal: Knowledge of General Education information will improve Description: Including pain rating scale, medication(s)/side effects and non-pharmacologic comfort measures 06/18/2022 1218 by Dillard Essex, RN Outcome: Completed/Met 06/18/2022 1116 by Dillard Essex, RN Outcome: Progressing   Problem: Health Behavior/Discharge Planning: Goal: Ability to manage health-related needs will improve 06/18/2022 1218 by Dillard Essex, RN Outcome: Completed/Met 06/18/2022 1116 by Dillard Essex, RN Outcome: Progressing   Problem: Clinical Measurements: Goal: Ability to maintain clinical measurements within normal limits will improve 06/18/2022 1218 by Dillard Essex, RN Outcome: Completed/Met 06/18/2022 1116 by Dillard Essex, RN Outcome: Progressing Goal: Will remain free from infection 06/18/2022 1218 by Dillard Essex, RN Outcome: Completed/Met 06/18/2022 1116 by Dillard Essex, RN Outcome: Progressing Goal: Diagnostic test results will improve 06/18/2022 1218 by Dillard Essex, RN Outcome: Completed/Met 06/18/2022 1116 by Dillard Essex, RN Outcome: Progressing Goal: Respiratory complications will improve 06/18/2022 1218 by Dillard Essex, RN Outcome: Completed/Met 06/18/2022 1116 by Dillard Essex, RN Outcome: Progressing Goal: Cardiovascular complication will be avoided 06/18/2022 1218 by Dillard Essex, RN Outcome: Completed/Met 06/18/2022 1116 by Dillard Essex, RN Outcome: Progressing   Problem: Activity: Goal: Risk  for activity intolerance will decrease 06/18/2022 1218 by Dillard Essex, RN Outcome: Completed/Met 06/18/2022 1116 by Dillard Essex, RN Outcome: Progressing   Problem: Nutrition: Goal: Adequate nutrition will be maintained 06/18/2022 1218 by Dillard Essex, RN Outcome: Completed/Met 06/18/2022 1116 by Dillard Essex, RN Outcome: Progressing  Problem: Coping: Goal: Level of anxiety will decrease 06/18/2022 1218 by Orvan Seen, RN Outcome: Completed/Met 06/18/2022 1116 by Orvan Seen, RN Outcome: Progressing   Problem: Elimination: Goal: Will not experience complications related to bowel motility 06/18/2022 1218 by Orvan Seen, RN Outcome: Completed/Met 06/18/2022 1116 by Orvan Seen, RN Outcome: Progressing Goal: Will not experience complications related to urinary retention 06/18/2022 1218 by Orvan Seen, RN Outcome: Completed/Met 06/18/2022 1116 by Orvan Seen, RN Outcome: Progressing   Problem: Pain Managment: Goal: General experience of comfort will improve 06/18/2022 1218 by Orvan Seen, RN Outcome: Completed/Met 06/18/2022 1116 by Orvan Seen, RN Outcome: Progressing   Problem: Safety: Goal: Ability to remain free from injury will improve 06/18/2022 1218 by Orvan Seen, RN Outcome: Completed/Met 06/18/2022 1116 by Orvan Seen, RN Outcome: Progressing   Problem: Skin Integrity: Goal: Risk for impaired skin integrity will decrease 06/18/2022 1218 by Orvan Seen, RN Outcome: Completed/Met 06/18/2022 1116 by Orvan Seen, RN Outcome: Progressing

## 2022-06-18 NOTE — Care Management Important Message (Signed)
Important Message  Patient Details  Name: Brian Dodson MRN: [id number] Date of Birth: 10-08-1956   Medicare Important Message Given:  N/A - LOS <3 / Initial given by admissions     Olegario Messier [name] 06/18/2022, 9:16 AM

## 2022-06-18 NOTE — TOC Transition Note (Signed)
Transition of Care [facility]) - CM/SW Discharge Note   Patient Details  Name: Brian Dodson MRN: [id number] Date of Birth: 21-Jun-1956  Transition of Care [facility]) CM/SW Contact:  Maree Krabbe, LCSW Phone Number: 06/18/2022, 11:47 AM   Clinical Narrative:    Tammy confirmed pt can return today. FL2 and dc summary faxed to Tammy at [facility] (f [phone]). Transport from ALF will come pick pt up in the next few minutes. RN has been updated.    Final next level of care: Assisted Living Barriers to Discharge: No Barriers Identified   Patient Goals and CMS Choice        Discharge Placement              Patient chooses bed at:  ([facility]) Patient to be transferred to facility by: ALF transport   Patient and family notified of of transfer: 06/18/22  Discharge Plan and Services                                     Social Determinants of Health (SDOH) Interventions     Readmission Risk Interventions    02/14/2022    1:19 PM 09/19/2021    3:16 PM  Readmission Risk Prevention Plan  Transportation Screening Complete Complete  PCP or Specialist Appt within 3-5 Days  Complete  HRI or Home Care Consult  Complete  Social Work Consult for Recovery Care Planning/Counseling  Complete  Palliative Care Screening  Not Applicable  Medication Review [facility])  Complete  HRI or Home Care Consult Complete   SW Recovery Care/Counseling Consult Complete   Palliative Care Screening Not Applicable   Skilled Nursing Facility Not Applicable

## 2022-06-18 NOTE — NC FL2 (Signed)
[place] LEVEL OF CARE SCREENING TOOL     IDENTIFICATION  Patient Name: Brian Dodson Birthdate: 1955/12/14 Sex: male Admission Date (Current Location): 06/14/2022  [facility] and [place] Number:  Engineering geologist and Address:  [facility], [address] [place], [place] [address]      Provider Number: [id number]  Attending Physician Name and Address:  Ezekiel Slocumb, DO  Relative Name and Phone Number:       Current Level of Care: Hospital Recommended Level of Care: [place] Prior Approval Number:    Date Approved/Denied:   PASRR Number:    Discharge Plan: Other (Comment) (ALF)    Current Diagnoses: Patient Active Problem List   Diagnosis Date Noted   Cellulitis of left lower extremity 06/14/2022   COPD (chronic obstructive pulmonary disease) ([place])    CKD (chronic kidney disease) stage 2, GFR 60-89 ml/min    HLD (hyperlipidemia)    Type II diabetes mellitus with renal manifestations ([place])    Depression    Cellulitis and abscess of left leg 02/12/2022   Streptococcal bacteremia 09/18/2021   Acute renal failure superimposed on stage 2 chronic kidney disease ([place]) 09/17/2021   Sepsis secondary to UTI ([place]) [id number]   Acute metabolic encephalopathy [id number]   Dementia ([place]) 03/05/2021   Memory loss due to medical condition 03/05/2021   PAF (paroxysmal atrial fibrillation) ([place]) 03/05/2021   Atrial flutter ([place]) 03/05/2021   Diabetes mellitus type 2, controlled, without complications ([place]) [id number]   Obese 03/05/2021   Anemia of chronic disease 03/04/2021   Acute lower UTI 03/02/2021   Acute febrile illness    Bilateral lower extremity edema 04/01/2018   Lower extremity pain, bilateral 04/01/2018   Swelling of limb 10/04/2017   Sepsis ([place]) 05/20/2017   Anemia 05/07/2017   B12 deficiency 05/07/2017   Loss of memory 05/07/2017   Atrial fibrillation with RVR ([place]) 05/23/2015   Syncope 05/23/2015    Schizophrenia ([place]) 05/23/2015   Diabetes mellitus, type II ([place]) 05/23/2015   HTN (hypertension) 05/23/2015   GERD (gastroesophageal reflux disease) 05/23/2015   CAD (coronary artery disease) 05/23/2015    Orientation RESPIRATION BLADDER Height & Weight     Self, Time, Situation, Place  Normal Continent Weight: 223 lb 15.8 oz (101.6 kg) Height:  6' 1.5" (186.7 cm)  BEHAVIORAL SYMPTOMS/MOOD NEUROLOGICAL BOWEL NUTRITION STATUS      Incontinent Diet (heart healthy/carb modified)  AMBULATORY STATUS COMMUNICATION OF NEEDS Skin   Independent Verbally  (open wound on left knee, foam dressing)                       Personal Care Assistance Level of Assistance  Dressing, Feeding, Bathing Bathing Assistance: Independent Feeding assistance: Independent Dressing Assistance: Independent     Functional Limitations Info  Sight, Hearing, Speech Sight Info: Adequate Hearing Info: Adequate Speech Info: Adequate    SPECIAL CARE FACTORS FREQUENCY                       Contractures Contractures Info: Not present    Additional Factors Info  Code Status, Allergies Code Status Info: full code Allergies Info: Sulfa Antibiotics, Xarelto (Rivaroxaban)             Discharge Medications: acetaminophen 500 MG tablet Commonly known as: TYLENOL Take 1,000 mg by mouth every 6 (six) hours as needed for mild pain.    amiodarone 200 MG tablet Commonly known as: PACERONE  Take 1 tablet (200 mg total) by mouth daily.    aspirin 81 MG tablet Take 81 mg by mouth daily.    atorvastatin 10 MG tablet Commonly known as: LIPITOR Take 10 mg by mouth daily.    benztropine 1 MG tablet Commonly known as: COGENTIN Take 1 mg by mouth at bedtime.    Biofreeze 4 % Gel Generic drug: Menthol (Topical Analgesic) Apply 1 application topically 3 (three) times daily. To lower back area as needed for pain    brimonidine-timolol 0.2-0.5 % ophthalmic solution Commonly known as:  COMBIGAN Place 1 drop into both eyes every 12 (twelve) hours. Wait 3 to 5 minutes between drops    carvedilol 12.5 MG tablet Commonly known as: COREG Take 12.5 mg by mouth in the morning and at bedtime.    cefdinir 300 MG capsule Commonly known as: OMNICEF Take 1 capsule (300 mg total) by mouth every 12 (twelve) hours for 6 days.    citalopram 20 MG tablet Commonly known as: CELEXA Take 20 mg by mouth daily.    diclofenac Sodium 1 % Gel Commonly known as: VOLTAREN Apply 2 g topically 4 (four) times daily as needed. Apply to neck and shoulders    Difluprednate 0.05 % Emul Apply to eye.    divalproex 500 MG 24 hr tablet Commonly known as: DEPAKOTE ER Take 2 tablets (1,000 mg total) by mouth 2 (two) times daily. What changed:  how much to take when to take this    donepezil 10 MG tablet Commonly known as: ARICEPT Take 10 mg by mouth at bedtime.    doxycycline 100 MG tablet Commonly known as: VIBRA-TABS Take 1 tablet (100 mg total) by mouth every 12 (twelve) hours for 6 days. Replaces: doxycycline 100 MG capsule    Eliquis 5 MG Tabs tablet Generic drug: apixaban Take 5 mg by mouth 2 (two) times daily.    ferrous sulfate 325 (65 FE) MG tablet Take 1 tablet (325 mg total) by mouth daily with breakfast. (Hold for 1 week, resume on 06/25/22) What changed: additional instructions    furosemide 20 MG tablet Commonly known as: LASIX Take 20 mg by mouth daily as needed. For edema (swelling)    Ilevro 0.3 % ophthalmic suspension Generic drug: nepafenac SMARTSIG:In Eye(s)    lanolin/mineral oil Lotn Apply 1 application topically as needed for dry skin. Apply to face after washing face daily    leptospermum manuka honey Pste paste Apply 1 Application topically daily.    loratadine 10 MG tablet Commonly known as: CLARITIN Take 10 mg by mouth daily.    Lumigan 0.01 % Soln Generic drug: bimatoprost Place 1 drop into both eyes at bedtime.    Magnesium Oxide 400 MG  Caps Take 1 capsule (400 mg total) by mouth daily. (Hold for 1 week, resume on 06/25/22) What changed:  how much to take how to take this when to take this additional instructions    melatonin 5 MG Tabs Take 5 mg by mouth at bedtime.    metFORMIN 500 MG tablet Commonly known as: GLUCOPHAGE Take 500 mg by mouth 2 (two) times daily with a meal.    omeprazole 20 MG capsule Commonly known as: PRILOSEC Take 20 mg by mouth 2 (two) times daily before a meal. May open and sprinkle in applesauce.    oyster calcium 500 MG Tabs tablet Take 500 mg of elemental calcium by mouth 2 (two) times daily.    risperidone 4 MG tablet Commonly known as: RISPERDAL  Take 4 mg by mouth at bedtime.    sodium chloride 0.65 % Soln nasal spray Commonly known as: OCEAN Place 1 spray into both nostrils daily.    zinc oxide 20 % ointment Apply 1 Application topically 2 (two) times daily as needed for irritation (to left buttock).            Relevant Imaging Results:  Relevant Lab Results:   Additional Information    Jessi Jessop A Delmi Fulfer, LCSW

## 2022-06-18 NOTE — Discharge Summary (Signed)
Physician Discharge Summary   Patient: Brian Dodson MRN: [id number] DOB: [birth year]  Admit date:     06/14/2022  Discharge date: 06/18/2022  Discharge Physician: Ezekiel Slocumb   PCP: [name], MD   Recommendations at discharge:    Follow up with Primary Care in 1-2 weeks Follow up at Wound Care clinic in 1-2 weeks Continue wound care to the left knee per instructions given Repeat BMP, CBC in 1-2 weeks Follow up with psychiatry as scheduled Monitor Depakote levels  Discharge Diagnoses: Principal Problem:   Cellulitis of left lower extremity Active Problems:   CAD (coronary artery disease)   HTN (hypertension)   CKD (chronic kidney disease) stage 2, GFR 60-89 ml/min   COPD (chronic obstructive pulmonary disease) (HCC)   Anemia of chronic disease   HLD (hyperlipidemia)   PAF (paroxysmal atrial fibrillation) (HCC)   Schizophrenia (HCC)   Depression   Type II diabetes mellitus with renal manifestations [facility])   Hospital Course:  Brian Dodson is a 66 y.o. male with medical history significant of HTN, HLD, COPD, CKD-2, CAD, BPH, A fib on Eliquis, anemia, GERD, depression, vascular dementia, who presented on 06/14/2022 for evaluation of worsening pain and swelling in the left leg for several days.  There was a small open wound on the left knee cap noted, and extensive swelling and erythema of the left knee and lower leg.  Prior history of cellulitis with abscess of the left posterior thigh which required I&D and hospital admission for IV antibiotics in March.    Patient was admitted and started on IV vancomycin and ceftriaxone.  Duplex U/S of LLE was negative for DVT, showed a Baker's cyst.  X-ray left knee negative for fracture or significant effusion, showed only anterior soft tissue swelling.   7/31: Pt doing well, continues to improve on oral antibiotics. Medically stable for discharge. Recommend wound care clinic follow up.  Assessment and Plan: * Cellulitis of left  lower extremity Did not meet sepsis criteria on admission.   Presented with severely swollen and erythematous LLE with anterior left knee wound.   No drain-able fluid collection seen on xray.  7/30: left lower leg remains severely swollen, warmth improved  --Treated with empiric IV vancomycin and ceftriaxone  --Continue oral doxycycline and cefdinir -- 6 more days at discharge to complete 10 day course --Wound care consulted - see wound care instructions --Mobilize as tolerated --Elevate left leg when in bed or recliner --Wrap left lower leg to help swelling --Continue Lasix PRN for swelling  CAD (coronary artery disease) Stable, no active chest pain. --continue ASA, lipitor --stat EKG and troponin if chest pain  HTN (hypertension) -IV hydralazine as needed -Coreg  CKD (chronic kidney disease) stage 2, GFR 60-89 ml/min Stable, GFR> 60. -Follow-up with BMP  COPD (chronic obstructive pulmonary disease) (HCC) Stable, no signs of exacerbation. --PRN albuterol  Anemia of chronic disease Hemoglobin 10.2 on admission, stable --Monitor CBC  HLD (hyperlipidemia) -- continue Lipitor  PAF (paroxysmal atrial fibrillation) (HCC) HR controlled.   - Continue Coreg, amiodarone -Continue Eliquis -Resume tele if tachycardic or palpitations  Depression Depression and schizophrenia:  Appear stable, calm demeanor, no apparent hallucinations noted. --Continue home meds: risperidone, Depakote, Celexa, benztropine  Schizophrenia (HCC) -Risperidone, Depakote, Celexa, benztropine  Type II diabetes mellitus with renal manifestations (HCC) Recent A1c 6.0, well controlled.   Takes metformin - resume at discharge. Primary Care follow up.         Consultants: Wound Care Procedures performed:  None  Disposition: Group home Diet recommendation:  Carb modified diet DISCHARGE MEDICATION: Allergies as of 06/18/2022       Reactions   Sulfa Antibiotics Other (See Comments)   Unknown  reaction   Xarelto [rivaroxaban]         Medication List     STOP taking these medications    doxycycline 100 MG capsule Commonly known as: VIBRAMYCIN Replaced by: doxycycline 100 MG tablet   moxifloxacin 0.5 % ophthalmic solution Commonly known as: VIGAMOX       TAKE these medications    acetaminophen 500 MG tablet Commonly known as: TYLENOL Take 1,000 mg by mouth every 6 (six) hours as needed for mild pain.   amiodarone 200 MG tablet Commonly known as: PACERONE Take 1 tablet (200 mg total) by mouth daily.   aspirin 81 MG tablet Take 81 mg by mouth daily.   atorvastatin 10 MG tablet Commonly known as: LIPITOR Take 10 mg by mouth daily.   benztropine 1 MG tablet Commonly known as: COGENTIN Take 1 mg by mouth at bedtime.   Biofreeze 4 % Gel Generic drug: Menthol (Topical Analgesic) Apply 1 application topically 3 (three) times daily. To lower back area as needed for pain   brimonidine-timolol 0.2-0.5 % ophthalmic solution Commonly known as: COMBIGAN Place 1 drop into both eyes every 12 (twelve) hours. Wait 3 to 5 minutes between drops   carvedilol 12.5 MG tablet Commonly known as: COREG Take 12.5 mg by mouth in the morning and at bedtime.   cefdinir 300 MG capsule Commonly known as: OMNICEF Take 1 capsule (300 mg total) by mouth every 12 (twelve) hours for 6 days.   citalopram 20 MG tablet Commonly known as: CELEXA Take 20 mg by mouth daily.   diclofenac Sodium 1 % Gel Commonly known as: VOLTAREN Apply 2 g topically 4 (four) times daily as needed. Apply to neck and shoulders   Difluprednate 0.05 % Emul Apply to eye.   divalproex 500 MG 24 hr tablet Commonly known as: DEPAKOTE ER Take 2 tablets (1,000 mg total) by mouth 2 (two) times daily. What changed:  how much to take when to take this   donepezil 10 MG tablet Commonly known as: ARICEPT Take 10 mg by mouth at bedtime.   doxycycline 100 MG tablet Commonly known as: VIBRA-TABS Take 1  tablet (100 mg total) by mouth every 12 (twelve) hours for 6 days. Replaces: doxycycline 100 MG capsule   Eliquis 5 MG Tabs tablet Generic drug: apixaban Take 5 mg by mouth 2 (two) times daily.   ferrous sulfate 325 (65 FE) MG tablet Take 1 tablet (325 mg total) by mouth daily with breakfast. (Hold for 1 week, resume on 06/25/22) What changed: additional instructions   furosemide 20 MG tablet Commonly known as: LASIX Take 20 mg by mouth daily as needed. For edema (swelling)   Ilevro 0.3 % ophthalmic suspension Generic drug: nepafenac SMARTSIG:In Eye(s)   lanolin/mineral oil Lotn Apply 1 application topically as needed for dry skin. Apply to face after washing face daily   leptospermum manuka honey Pste paste Apply 1 Application topically daily.   loratadine 10 MG tablet Commonly known as: CLARITIN Take 10 mg by mouth daily.   Lumigan 0.01 % Soln Generic drug: bimatoprost Place 1 drop into both eyes at bedtime.   Magnesium Oxide 400 MG Caps Take 1 capsule (400 mg total) by mouth daily. (Hold for 1 week, resume on 06/25/22) What changed:  how much to take how  to take this when to take this additional instructions   melatonin 5 MG Tabs Take 5 mg by mouth at bedtime.   metFORMIN 500 MG tablet Commonly known as: GLUCOPHAGE Take 500 mg by mouth 2 (two) times daily with a meal.   omeprazole 20 MG capsule Commonly known as: PRILOSEC Take 20 mg by mouth 2 (two) times daily before a meal. May open and sprinkle in applesauce.   oyster calcium 500 MG Tabs tablet Take 500 mg of elemental calcium by mouth 2 (two) times daily.   risperidone 4 MG tablet Commonly known as: RISPERDAL Take 4 mg by mouth at bedtime.   sodium chloride 0.65 % Soln nasal spray Commonly known as: OCEAN Place 1 spray into both nostrils daily.   zinc oxide 20 % ointment Apply 1 Application topically 2 (two) times daily as needed for irritation (to left buttock).               Discharge  Care Instructions  (From admission, onward)           Start     Ordered   06/18/22 0000  Discharge wound care:       Comments: Add Medihoney for topical care; autolytic debridement and antimicrobial Top with dry dressing and silicone foam dressing Reapply Medihoney daily and change foam every 3 days.   Follow up in wound care clinic in 1-2 weeks.   06/18/22 1136            Follow-up Information     H[place], Rosalyn Charters, MD. Schedule an appointment as soon as possible for a visit in 1 week(s).   Specialty: Internal Medicine Why: Hospital follow up for left lower extremity cellulitis Contact information: [address] [place] [address] [phone]         [place]. Schedule an appointment as soon as possible for a visit in 1 week(s).   Specialty: Wound Care Why: Follow up for left knee wound. Contact information: [address] [id number] ar [phone]               Discharge Exam: Danley Danker Weights   06/14/22 1322  Weight: 101.6 kg   General exam: awake, alert, no acute distress HEENT: atraumatic, clear conjunctiva, anicteric sclera, moist mucus membranes, hearing grossly normal  Respiratory system: CTAB, no wheezes, rales or rhonchi, normal respiratory effort. Cardiovascular system: normal S1/S2, RRR  Gastrointestinal system: soft, NT, ND, no HSM felt, +bowel sounds. Central nervous system: no gross focal neurologic deficits, normal speech Extremities: left lower extremity edema is stable but persistent, no differential warmth of the LE's, ACE wrap on lower left leg Psychiatry: normal mood, congruent affect   Condition at discharge: stable  The results of significant diagnostics from this hospitalization (including imaging, microbiology, ancillary and laboratory) are listed below for reference.   Imaging Studies: US Venous Img Lower Unilateral Left  Result Date: 06/14/2022 CLINICAL DATA:   Red and swollen LEFT lower extremity EXAM: LEFT LOWER EXTREMITY VENOUS DOPPLER ULTRASOUND TECHNIQUE: Gray-scale sonography with compression, as well as color and duplex ultrasound, were performed to evaluate the deep venous system(s) from the level of the common femoral vein through the popliteal and proximal calf veins. COMPARISON:  None Available. FINDINGS: VENOUS Normal compressibility of the common femoral, superficial femoral, and popliteal veins, as well as the visualized calf veins. Visualized portions of profunda femoral vein and great saphenous vein unremarkable. No filling defects to suggest DVT on grayscale or color Doppler  imaging. Doppler waveforms show normal direction of venous flow, normal respiratory plasticity and response to augmentation. Limited views of the contralateral common femoral vein are unremarkable. OTHER Fluid collection the popliteal fossa measures 2.4 x 1.0 x 2.0 cm. Limitations: none IMPRESSION: 1. No evidence of deep venous thrombosis LEFT lower extremity. 2. Small fluid collection popliteal fossa suggests Baker cyst. Electronically Signed   By: Genevive Bi M.D.   On: 06/14/2022 15:13   DG Knee Complete 4 Views Left  Result Date: 06/14/2022 CLINICAL DATA:  knee wound EXAM: LEFT KNEE - COMPLETE 4+ VIEW COMPARISON:  CT 02/05/2022 FINDINGS: There is no acute osseous abnormality. No significant joint effusion. 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 There is soft tissue swelling anteriorly with bandage material noted. IMPRESSION: Anterior soft tissue swelling. No acute osseous abnormality or significant joint effusion. 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 Electronically Signed   By: Caprice Renshaw M.D.   On: 06/14/2022 13:48    Microbiology: Results for orders placed or performed during the hospital encounter of 06/14/22  Blood culture (routine x 2)     Status: None (Preliminary result)    Collection Time: 06/14/22 12:55 PM   Specimen: BLOOD LEFT ARM  Result Value Ref Range Status   Specimen Description BLOOD LEFT ARM  Final   Special Requests   Final    BOTTLES DRAWN AEROBIC AND ANAEROBIC Blood Culture results may not be optimal due to an inadequate volume of blood received in culture bottles   Culture   Final    NO GROWTH 4 DAYS Performed at [facility], [address], [place], [place] [address]    Report Status PENDING  Incomplete  Blood culture (routine x 2)     Status: None (Preliminary result)   Collection Time: 06/14/22  2:24 PM   Specimen: BLOOD LEFT ARM  Result Value Ref Range Status   Specimen Description BLOOD LEFT ARM  Final   Special Requests   Final    BOTTLES DRAWN AEROBIC AND ANAEROBIC Blood Culture adequate volume   Culture   Final    NO GROWTH 4 DAYS Performed at [facility], [address] [place], [place] [address]    Report Status PENDING  Incomplete  MRSA Next Gen by PCR, Nasal     Status: Abnormal   Collection Time: 06/14/22  6:50 PM   Specimen: Nasal Mucosa; Nasal Swab  Result Value Ref Range Status   MRSA by PCR Next Gen DETECTED (A) NOT DETECTED Final    Comment: RESULT CALLED TO, READ BACK BY AND VERIFIED WITH: PATRICK TUTTLE @2055  ON 06/14/22 SKL (NOTE) The GeneXpert MRSA Assay (FDA approved for NASAL specimens only), is one component of a comprehensive MRSA colonization surveillance program. It is not intended to diagnose MRSA infection nor to guide or monitor treatment for MRSA infections. Test performance is not FDA approved in patients less than 13 years old. Performed at [facility], [address] [place], [place]     Labs: CBC: Recent Labs  Lab 06/14/22 1255 06/15/22 0640  WBC 8.2 6.4  NEUTROABS 4.1  --   HGB 10.2* 10.6*  HCT 33.5* 32.9*  MCV 105.0* 100.9*  PLT 199 210   Basic Metabolic Panel: Recent Labs  Lab 06/14/22 1255 06/15/22 0640 06/17/22 0605  NA 139 141  137  K 4.6 4.7 4.8  CL 105 105 103  CO2 29 29 26   GLUCOSE 76 81 99  BUN  30* 23 19  CREATININE 1.23 0.93 1.04  CALCIUM 9.0 9.1 8.9   Liver Function Tests: No results for input(s): "AST", "ALT", "ALKPHOS", "BILITOT", "PROT", "ALBUMIN" in the last 168 hours. CBG: Recent Labs  Lab 06/17/22 0917 06/17/22 1152 06/17/22 1702 06/17/22 2049 06/18/22 0825  GLUCAP 131* 89 230* 156* 94    Discharge time spent: greater than 30 minutes.  Signed: Pennie Banter, DO [facility] 06/18/2022

## 2022-06-18 NOTE — Plan of Care (Signed)

## 2022-06-18 NOTE — Discharge Instructions (Signed)
LEFT KNEE WOUND CARE --- 1) Add Medihoney for topical care. 2) Top with dry dressing and silicone foam dressing 3) Reapply Medihoney daily and change foam every 3 days.

## 2022-06-19 LAB — CULTURE, BLOOD (ROUTINE X 2)
Culture: NO GROWTH
Culture: NO GROWTH
Special Requests: ADEQUATE

## 2022-06-27 ENCOUNTER — Encounter: Payer: Medicare Other | Attending: Internal Medicine | Admitting: Internal Medicine

## 2022-06-27 DIAGNOSIS — Z7984 Long term (current) use of oral hypoglycemic drugs: Secondary | ICD-10-CM | POA: Diagnosis not present

## 2022-06-27 DIAGNOSIS — E11622 Type 2 diabetes mellitus with other skin ulcer: Secondary | ICD-10-CM | POA: Insufficient documentation

## 2022-06-27 DIAGNOSIS — F209 Schizophrenia, unspecified: Secondary | ICD-10-CM | POA: Insufficient documentation

## 2022-06-27 DIAGNOSIS — F0390 Unspecified dementia without behavioral disturbance: Secondary | ICD-10-CM | POA: Insufficient documentation

## 2022-06-27 DIAGNOSIS — S81802A Unspecified open wound, left lower leg, initial encounter: Secondary | ICD-10-CM

## 2022-06-27 DIAGNOSIS — J449 Chronic obstructive pulmonary disease, unspecified: Secondary | ICD-10-CM | POA: Insufficient documentation

## 2022-06-27 DIAGNOSIS — Z7901 Long term (current) use of anticoagulants: Secondary | ICD-10-CM | POA: Diagnosis not present

## 2022-06-27 DIAGNOSIS — I4891 Unspecified atrial fibrillation: Secondary | ICD-10-CM | POA: Insufficient documentation

## 2022-06-27 DIAGNOSIS — L97822 Non-pressure chronic ulcer of other part of left lower leg with fat layer exposed: Secondary | ICD-10-CM | POA: Diagnosis not present

## 2022-06-27 NOTE — Progress Notes (Signed)
Brian, Dodson ([id number]) Visit Report for 06/27/2022 Abuse Risk Screen Details Patient Name: Brian Dodson, Brian Dodson Date of Service: 06/27/2022 2:30 PM Medical Record Number: [id number] Patient Account Number: [id number] Date of Birth/Sex: Nov 06, 1956 (66 y.o. M) Treating RN: Angelina Pih Primary Care Jaivion Kingsley: Donnel Saxon Other Clinician: Referring Cornelio Parkerson: Gwenevere Ghazi Treating Azura Tufaro/Extender: Tilda Franco in Treatment: 0 Abuse Risk Screen Items Answer Notes pt unable to answer Electronic Signature(s) Signed: 06/27/2022 3:54:56 PM By: Angelina Pih Entered By: Angelina Pih on 06/27/2022 14:24:00 Brian Dodson ([id number]) -------------------------------------------------------------------------------- Activities of Daily Living Details Patient Name: Brian Dodson Date of Service: 06/27/2022 2:30 PM Medical Record Number: [id number] Patient Account Number: [id number] Date of Birth/Sex: [birth year] (66 y.o. M) Treating RN: Angelina Pih Primary Care Francies Inch: Donnel Saxon Other Clinician: Referring Deborah Dondero: Gwenevere Ghazi Treating Aubryanna Nesheim/Extender: Tilda Franco in Treatment: 0 Activities of Daily Living Items Answer Activities of Daily Living (Please select one for each item) Drive Automobile Not Able Take Medications Need Assistance Use Telephone Need Assistance Care for Appearance Need Assistance Use Toilet Need Assistance Bath / Shower Need Assistance Dress Self Need Assistance Feed Self Need Assistance Walk Need Assistance Get In / Out Bed Need Assistance Housework Need Assistance Prepare Meals Need Assistance Handle Money Need Assistance Shop for Self Need Assistance Electronic Signature(s) Signed: 06/27/2022 3:54:56 PM By: Angelina Pih Entered By: Angelina Pih on 06/27/2022 14:24:41 Brian Dodson ([id number]) -------------------------------------------------------------------------------- Education Screening Details Patient  Name: Brian Dodson Date of Service: 06/27/2022 2:30 PM Medical Record Number: [id number] Patient Account Number: [id number] Date of Birth/Sex: Nov 06, 1956 (66 y.o. M) Treating RN: Angelina Pih Primary Care Nita Whitmire: Donnel Saxon Other Clinician: Referring Asaad Gulley: Gwenevere Ghazi Treating Kamelia Lampkins/Extender: Tilda Franco in Treatment: 0 Primary Learner Assessed: Caregiver care director Reason Patient is not Primary Learner: group home Learning Preferences/Education Level/Primary Language Learning Preference: Explanation, Demonstration, Video, Communication Board, Printed Material Preferred Language: English Cognitive Barrier Language Barrier: No Translator Needed: No Memory Deficit: No Emotional Barrier: No Cultural/Religious Beliefs Affecting Medical Care: No Physical Barrier Impaired Vision: No Impaired Hearing: No Decreased Hand dexterity: No Knowledge/Comprehension Knowledge Level: Medium Comprehension Level: Medium Ability to understand written instructions: Medium Ability to understand verbal instructions: Medium Motivation Anxiety Level: Calm Cooperation: Cooperative Education Importance: Acknowledges Need Interest in Health Problems: Asks Questions Perception: Coherent Willingness to Engage in Self-Management High Activities: Readiness to Engage in Self-Management High Activities: Electronic Signature(s) Signed: 06/27/2022 3:54:56 PM By: Angelina Pih Entered By: Angelina Pih on 06/27/2022 14:25:22 Brian Dodson ([id number]) -------------------------------------------------------------------------------- Fall Risk Assessment Details Patient Name: Brian Dodson Date of Service: 06/27/2022 2:30 PM Medical Record Number: [id number] Patient Account Number: [id number] Date of Birth/Sex: Jan 26, 1956 (66 y.o. M) Treating RN: Angelina Pih Primary Care Johnn Krasowski: Donnel Saxon Other Clinician: Referring Cataleyah Colborn: Gwenevere Ghazi Treating  Emmalie Haigh/Extender: Tilda Franco in Treatment: 0 Fall Risk Assessment Items Have you had 2 or more falls in the last 12 monthso 0 No Have you had any fall that resulted in injury in the last 12 monthso 0 No FALLS RISK SCREEN History of falling - immediate or within 3 months 0 No Secondary diagnosis (Do you have 2 or more medical diagnoseso) 0 No Ambulatory aid None/bed rest/wheelchair/nurse 0 Yes Crutches/cane/walker 0 No Furniture 0 No Intravenous therapy Access/Saline/Heparin Lock 0 No Gait/Transferring Normal/ bed rest/ wheelchair 0 Yes Weak (short steps with or without shuffle, stooped but able to lift head while walking, may 0 No seek support from furniture) Impaired (short steps with shuffle, may have difficulty arising  from chair, head down, impaired 0 No balance) Mental Status Oriented to own ability 0 Yes Electronic Signature(s) Signed: 06/27/2022 3:54:56 PM By: Angelina Pih Entered By: Angelina Pih on 06/27/2022 14:25:31 Brian Dodson ([id number]) -------------------------------------------------------------------------------- Foot Assessment Details Patient Name: Brian Dodson Date of Service: 06/27/2022 2:30 PM Medical Record Number: [id number] Patient Account Number: [id number] Date of Birth/Sex: 1956-09-05 (66 y.o. M) Treating RN: Angelina Pih Primary Care Jackye Dever: Donnel Saxon Other Clinician: Referring Maritssa Haughton: Gwenevere Ghazi Treating Jalyiah Shelley/Extender: Tilda Franco in Treatment: 0 Foot Assessment Items Site Locations + = Sensation present, - = Sensation absent, C = Callus, U = Ulcer R = Redness, W = Warmth, M = Maceration, PU = Pre-ulcerative lesion F = Fissure, S = Swelling, D = Dryness Assessment Right: Left: Other Deformity: No No Prior Foot Ulcer: No No Prior Amputation: No No Charcot Joint: No No Ambulatory Status: Ambulatory Without Help Gait: Steady Electronic Signature(s) Signed: 06/27/2022 3:54:56 PM By: Angelina Pih Entered By: Angelina Pih on 06/27/2022 14:36:23 Brian Dodson ([id number]) -------------------------------------------------------------------------------- Nutrition Risk Screening Details Patient Name: Brian Dodson Date of Service: 06/27/2022 2:30 PM Medical Record Number: [id number] Patient Account Number: [id number] Date of Birth/Sex: 1956/11/01 (66 y.o. M) Treating RN: Angelina Pih Primary Care Marquies Wanat: Donnel Saxon Other Clinician: Referring Reizy Dunlow: Gwenevere Ghazi Treating Meleah Demeyer/Extender: Tilda Franco in Treatment: 0 Height (in): 75 Weight (lbs): 220 Body Mass Index (BMI): 27.5 Nutrition Risk Screening Items Score Screening NUTRITION RISK SCREEN: I have an illness or condition that made me change the kind and/or amount of food I eat 0 No I eat fewer than two meals per day 0 No I eat few fruits and vegetables, or milk products 0 No I have three or more drinks of beer, liquor or wine almost every day 0 No I have tooth or mouth problems that make it hard for me to eat 0 No I don't always have enough money to buy the food I need 0 No I eat alone most of the time 0 No I take three or more different prescribed or over-the-counter drugs a day 0 No Without wanting to, I have lost or gained 10 pounds in the last six months 0 No I am not always physically able to shop, cook and/or feed myself 0 No Nutrition Protocols Good Risk Protocol 0 No interventions needed Moderate Risk Protocol High Risk Proctocol Risk Level: Good Risk Score: 0 Electronic Signature(s) Signed: 06/27/2022 3:54:56 PM By: Angelina Pih Entered By: Angelina Pih on 06/27/2022 14:25:41

## 2022-06-27 NOTE — Progress Notes (Signed)
[name], [name] ([id number]) Visit Report for 06/27/2022 Allergy List Details Patient Name: Brian Dodson, Brian Dodson Date of Service: 06/27/2022 2:30 PM Medical Record Number: [id number] Patient Account Number: [id number] Date of Birth/Sex: [birth year] (65 y.o. M) Treating RN: Angelina Pih Primary Care Caton Popowski: Donnel Saxon Other Clinician: Referring Salaya Holtrop: Gwenevere Ghazi Treating Viet Kemmerer/Extender: Tilda Franco in Treatment: 0 Allergies Active Allergies Sulfa (Sulfonamide Antibiotics) Xarelto Allergy Notes Electronic Signature(s) Signed: 06/27/2022 3:54:56 PM By: Angelina Pih Entered By: Angelina Pih on 06/27/2022 14:18:49 Brian Dodson ([id number]) -------------------------------------------------------------------------------- Arrival Information Details Patient Name: Brian Dodson Date of Service: 06/27/2022 2:30 PM Medical Record Number: [id number] Patient Account Number: [id number] Date of Birth/Sex: August 19, 1956 (65 y.o. M) Treating RN: Angelina Pih Primary Care Balian Schaller: Donnel Saxon Other Clinician: Referring Anija Brickner: Gwenevere Ghazi Treating Jaksen Fiorella/Extender: Tilda Franco in Treatment: 0 Visit Information Patient Arrived: Ambulatory Arrival Time: 14:14 Accompanied By: self care director Transfer Assistance: None Patient Identification Verified: Yes Secondary Verification Process Completed: Yes Patient Has Alerts: Yes Patient Alerts: Patient on Blood Thinner Electronic Signature(s) Signed: 06/27/2022 3:10:55 PM By: Angelina Pih Entered By: Angelina Pih on 06/27/2022 15:10:55 Brian Dodson ([id number]) -------------------------------------------------------------------------------- Clinic Level of Care Assessment Details Patient Name: Brian Dodson Date of Service: 06/27/2022 2:30 PM Medical Record Number: [id number] Patient Account Number: [id number] Date of Birth/Sex: 08-Jul-1956 (65 y.o. M) Treating RN: Angelina Pih Primary Care  Falon Flinchum: Donnel Saxon Other Clinician: Referring Melaney Tellefsen: Gwenevere Ghazi Treating Yaneli Keithley/Extender: Tilda Franco in Treatment: 0 Clinic Level of Care Assessment Items TOOL 1 Quantity Score []  - Use when EandM and Procedure is performed on INITIAL visit 0 ASSESSMENTS - Nursing Assessment / Reassessment X - General Physical Exam (combine w/ comprehensive assessment (listed just below) when performed on new 1 20 pt. evals) X- 1 25 Comprehensive Assessment (HX, ROS, Risk Assessments, Wounds Hx, etc.) ASSESSMENTS - Wound and Skin Assessment / Reassessment []  - Dermatologic / Skin Assessment (not related to wound area) 0 ASSESSMENTS - Ostomy and/or Continence Assessment and Care []  - Incontinence Assessment and Management 0 []  - 0 Ostomy Care Assessment and Management (repouching, etc.) PROCESS - Coordination of Care X - Simple Patient / Family Education for ongoing care 1 15 []  - 0 Complex (extensive) Patient / Family Education for ongoing care X- 1 10 Staff obtains , Records, Test Results / Process Orders []  - 0 Staff telephones HHA, Nursing Homes / Clarify orders / etc []  - 0 Routine Transfer to another Facility (non-emergent condition) []  - 0 Routine Hospital Admission (non-emergent condition) X- 1 15 New Admissions / / Ordering NPWT, Apligraf, etc. []  - 0 Emergency Hospital Admission (emergent condition) PROCESS - Special Needs []  - Pediatric / Minor Patient Management 0 []  - 0 Isolation Patient Management []  - 0 Hearing / Language / Visual special needs []  - 0 Assessment of Community assistance (transportation, D/C planning, etc.) []  - 0 Additional assistance / Altered mentation []  - 0 Support Surface(s) Assessment (bed, cushion, seat, etc.) INTERVENTIONS - Miscellaneous []  - External ear exam 0 []  - 0 Patient Transfer (multiple staff / / Similar devices) []  - 0 Simple Staple / Suture removal (25 or  less) []  - 0 Complex Staple / Suture removal (26 or more) []  - 0 Hypo/Hyperglycemic Management (do not check if billed separately) X- 1 15 Ankle / Brachial Index (ABI) - do not check if billed separately Has the patient been seen at the hospital within the last three years: Yes Total Score: 100 Level Of  Care: New/Established - Level 3 THURMOND, FAMIGLIETTI ([id number]) Electronic Signature(s) Signed: 06/27/2022 3:54:56 PM By: Levora Dredge Entered By: Levora Dredge on 06/27/2022 15:22:44 Brian Dodson ([id number]) -------------------------------------------------------------------------------- Encounter Discharge Information Details Patient Name: Brian Dodson Date of Service: 06/27/2022 2:30 PM Medical Record Number: [id number] Patient Account Number: [id number] Date of Birth/Sex: [birth year] (65 y.o. M) Treating RN: Levora Dredge Primary Care Tel Hevia: Celedonio Miyamoto Other Clinician: Referring Anamari Galeas: Irene Limbo Treating Lucious Zou/Extender: Yaakov Guthrie in Treatment: 0 Encounter Discharge Information Items Post Procedure Vitals Discharge Condition: Stable Temperature (F): 98.2 Ambulatory Status: Ambulatory Pulse (bpm): 61 Discharge Destination: Other (Note Required) Respiratory Rate (breaths/min): 18 Telephoned: No Blood Pressure (mmHg): 129/76 Orders Sent: Yes Transportation: Other Accompanied By: care director of group home Schedule Follow-up Appointment: Yes Clinical Summary of Care: Electronic Signature(s) Signed: 06/27/2022 3:24:08 PM By: Levora Dredge Entered By: Levora Dredge on 06/27/2022 15:24:08 Brian Dodson ([id number]) -------------------------------------------------------------------------------- Lower Extremity Assessment Details Patient Name: Brian Dodson Date of Service: 06/27/2022 2:30 PM Medical Record Number: [id number] Patient Account Number: [id number] Date of Birth/Sex: [birth year] (65 y.o. M) Treating RN: Levora Dredge Primary Care Gwendalynn Eckstrom: Celedonio Miyamoto Other Clinician: Referring Kalin Amrhein: Irene Limbo Treating Dannelle Rhymes/Extender: Yaakov Guthrie in Treatment: 0 Edema Assessment Assessed: [Left: No] [Right: No] Edema: [Left: Ye] [Right: s] Calf Left: Right: Point of Measurement: 40 cm From Medial Instep 42.3 cm Ankle Left: Right: Point of Measurement: 11 cm From Medial Instep 25.8 cm Vascular Assessment Pulses: Dorsalis Pedis Palpable: [Left:Yes] Blood Pressure: Brachial: [Left:129] Electronic Signature(s) Signed: 06/27/2022 3:54:56 PM By: Levora Dredge Entered By: Levora Dredge on 06/27/2022 14:40:29 Brian Dodson ([id number]) -------------------------------------------------------------------------------- Multi Wound Chart Details Patient Name: Brian Dodson Date of Service: 06/27/2022 2:30 PM Medical Record Number: [id number] Patient Account Number: [id number] Date of Birth/Sex: October 23, 1956 (65 y.o. M) Treating RN: Levora Dredge Primary Care Kentavious Michele: Celedonio Miyamoto Other Clinician: Referring Chaia Ikard: Irene Limbo Treating Amelie Caracci/Extender: Yaakov Guthrie in Treatment: 0 Vital Signs Height(in): 75 Pulse(bpm): 61 Weight(lbs): 220 Blood Pressure(mmHg): 129/76 Body Mass Index(BMI): 27.5 Temperature(F): 98.1 Respiratory Rate(breaths/min): 18 Photos: [N/A:N/A] Wound Location: Left Knee N/A N/A Wounding Event: Gradually Appeared N/A N/A Primary Etiology: Diabetic Wound/Ulcer of the Lower N/A N/A Extremity Comorbid History: Anemia, Chronic Obstructive N/A N/A Pulmonary Disease (COPD), Arrhythmia, Coronary Artery Disease, Hypertension, Type II Diabetes, History of pressure wounds, Dementia Date Acquired: 06/14/2022 N/A N/A Weeks of Treatment: 0 N/A N/A Wound Status: Open N/A N/A Wound Recurrence: No N/A N/A Measurements L x W x D (cm) 0.9x1.6x0.1 N/A N/A Area (cm) : 1.131 N/A N/A Volume (cm) : 0.113 N/A N/A Classification: Grade 2 N/A  N/A Exudate Amount: Medium N/A N/A Exudate Type: Serosanguineous N/A N/A Exudate Color: red, brown N/A N/A Granulation Amount: None Present (0%) N/A N/A Necrotic Amount: Large (67-100%) N/A N/A Epithelialization: None N/A N/A Treatment Notes Electronic Signature(s) Signed: 06/27/2022 3:54:56 PM By: Levora Dredge Entered By: Levora Dredge on 06/27/2022 14:52:04 Brian Dodson ([id number]) -------------------------------------------------------------------------------- Multi-Disciplinary Care Plan Details Patient Name: Brian Dodson Date of Service: 06/27/2022 2:30 PM Medical Record Number: [id number] Patient Account Number: [id number] Date of Birth/Sex: 12-Apr-1956 (65 y.o. M) Treating RN: Levora Dredge Primary Care Tashianna Broome: Celedonio Miyamoto Other Clinician: Referring Cheral Cappucci: Irene Limbo Treating Ethaniel Garfield/Extender: Yaakov Guthrie in Treatment: 0 Active Inactive Orientation to the Wound Care Program Nursing Diagnoses: Knowledge deficit related to the wound healing center program Goals: Patient/caregiver will verbalize understanding of the [facility] Date Initiated: 06/27/2022 Target Resolution Date: 07/04/2022 Goal Status: Active  Interventions: Provide education on orientation to the wound center Notes: Wound/Skin Impairment Nursing Diagnoses: Impaired tissue integrity Knowledge deficit related to ulceration/compromised skin integrity Goals: Ulcer/skin breakdown will have a volume reduction of 30% by week 4 Date Initiated: 06/27/2022 Target Resolution Date: 07/25/2022 Goal Status: Active Ulcer/skin breakdown will have a volume reduction of 50% by week 8 Date Initiated: 06/27/2022 Target Resolution Date: 08/22/2022 Goal Status: Active Ulcer/skin breakdown will have a volume reduction of 80% by week 12 Date Initiated: 06/27/2022 Target Resolution Date: 09/19/2022 Goal Status: Active Ulcer/skin breakdown will heal within 14 weeks Date Initiated:  06/27/2022 Target Resolution Date: 10/03/2022 Goal Status: Active Interventions: Assess patient/caregiver ability to obtain necessary supplies Assess patient/caregiver ability to perform ulcer/skin care regimen upon admission and as needed Assess ulceration(s) every visit Provide education on ulcer and skin care Notes: Electronic Signature(s) Signed: 06/27/2022 3:54:56 PM By: Angelina Pih Entered By: Angelina Pih on 06/27/2022 14:51:56 Brian Dodson ([id number]) -------------------------------------------------------------------------------- Pain Assessment Details Patient Name: Brian Dodson Date of Service: 06/27/2022 2:30 PM Medical Record Number: [id number] Patient Account Number: [id number] Date of Birth/Sex: [birth year] (65 y.o. M) Treating RN: Angelina Pih Primary Care Rashawd Laskaris: Donnel Saxon Other Clinician: Referring Kathrina Crosley: Gwenevere Ghazi Treating Bray Vickerman/Extender: Tilda Franco in Treatment: 0 Active Problems Location of Pain Severity and Description of Pain Patient Has Paino No Site Locations Rate the pain. Current Pain Level: 0 Pain Management and Medication Current Pain Management: Electronic Signature(s) Signed: 06/27/2022 3:54:56 PM By: Angelina Pih Entered By: Angelina Pih on 06/27/2022 14:14:58 Brian Dodson ([id number]) -------------------------------------------------------------------------------- Patient/Caregiver Education Details Patient Name: Brian Dodson Date of Service: 06/27/2022 2:30 PM Medical Record Number: [id number] Patient Account Number: [id number] Date of Birth/Gender: August 15, 1956 (65 y.o. M) Treating RN: Angelina Pih Primary Care Physician: Donnel Saxon Other Clinician: Referring Physician: Gwenevere Ghazi Treating Physician/Extender: Tilda Franco in Treatment: 0 Education Assessment Education Provided To: Patient Education Topics Provided Welcome To The [facility] Care Center: Handouts: Welcome To  The Wound Care Center Methods: Explain/Verbal Responses: State content correctly Wound/Skin Impairment: Handouts: Caring for Your Ulcer Methods: Explain/Verbal Responses: State content correctly Electronic Signature(s) Signed: 06/27/2022 3:54:56 PM By: Angelina Pih Entered By: Angelina Pih on 06/27/2022 15:23:09 Brian Dodson ([id number]) -------------------------------------------------------------------------------- Wound Assessment Details Patient Name: Brian Dodson Date of Service: 06/27/2022 2:30 PM Medical Record Number: [id number] Patient Account Number: [id number] Date of Birth/Sex: 09-Jan-1956 (65 y.o. M) Treating RN: Angelina Pih Primary Care Analise Glotfelty: Donnel Saxon Other Clinician: Referring Ismelda Weatherman: Gwenevere Ghazi Treating Aws Shere/Extender: Tilda Franco in Treatment: 0 Wound Status Wound Number: 1 Primary Diabetic Wound/Ulcer of the Lower Extremity Etiology: Wound Location: Left Knee Wound Open Wounding Event: Gradually Appeared Status: Date Acquired: 06/14/2022 Comorbid Anemia, Chronic Obstructive Pulmonary Disease (COPD), Weeks Of Treatment: 0 History: Arrhythmia, Coronary Artery Disease, Hypertension, Type Clustered Wound: No II Diabetes, History of pressure wounds, Dementia Photos Wound Measurements Length: (cm) 0.9 Width: (cm) 1.6 Depth: (cm) 0.1 Area: (cm) 1.131 Volume: (cm) 0.113 % Reduction in Area: % Reduction in Volume: Epithelialization: None Tunneling: No Undermining: No Wound Description Classification: Grade 2 Exudate Amount: Medium Exudate Type: Serosanguineous Exudate Color: red, brown Foul Odor After Cleansing: No Slough/Fibrino Yes Wound Bed Granulation Amount: None Present (0%) Necrotic Amount: Large (67-100%) Necrotic Quality: Adherent Slough Electronic Signature(s) Signed: 06/27/2022 3:54:56 PM By: Angelina Pih Entered By: Angelina Pih on 06/27/2022 14:33:50 Brian Dodson  ([id number]) -------------------------------------------------------------------------------- Vitals Details Patient Name: Brian Dodson Date of Service: 06/27/2022 2:30 PM Medical Record Number: [id number] Patient Account Number: [id number]  Date of Birth/Sex: 1956/07/26 (66 y.o. M) Treating RN: Levora Dredge Primary Care Rhealyn Cullen: Celedonio Miyamoto Other Clinician: Referring Wagner Tanzi: Irene Limbo Treating Erick Oxendine/Extender: Yaakov Guthrie in Treatment: 0 Vital Signs Time Taken: 14:15 Temperature (F): 98.1 Height (in): 75 Pulse (bpm): 61 Source: Stated Respiratory Rate (breaths/min): 18 Weight (lbs): 220 Blood Pressure (mmHg): 129/76 Source: Stated Reference Range: 80 - 120 mg / dl Body Mass Index (BMI): 27.5 Electronic Signature(s) Signed: 06/27/2022 3:54:56 PM By: Levora Dredge Entered By: Levora Dredge on 06/27/2022 14:18:06

## 2022-06-27 NOTE — Progress Notes (Signed)
Brian Dodson, Brian Dodson ([id number]) Visit Report for 06/27/2022 Chief Complaint Document Details Patient Name: Brian Dodson, Brian Dodson Date of Service: 06/27/2022 2:30 PM Medical Record Number: [id number] Patient Account Number: [id number] Date of Birth/Sex: 04/04/1956 (65 y.o. M) Treating RN: Angelina Pih Primary Care Provider: Donnel Saxon Other Clinician: Referring Provider: Gwenevere Ghazi Treating Provider/Extender: Tilda Franco in Treatment: 0 Information Obtained from: Patient Chief Complaint 06/27/2022; the left knee wound Electronic Signature(s) Signed: 06/27/2022 3:09:11 PM By: Geralyn Corwin DO Entered By: Geralyn Corwin on 06/27/2022 14:38:56 Brian Dodson ([id number]) -------------------------------------------------------------------------------- Debridement Details Patient Name: Brian Dodson Date of Service: 06/27/2022 2:30 PM Medical Record Number: [id number] Patient Account Number: [id number] Date of Birth/Sex: 12-02-1955 (65 y.o. M) Treating RN: Angelina Pih Primary Care Provider: Donnel Saxon Other Clinician: Referring Provider: Gwenevere Ghazi Treating Provider/Extender: Tilda Franco in Treatment: 0 Debridement Performed for Wound #1 Left Knee Assessment: Performed By: Physician Geralyn Corwin, MD Debridement Type: Debridement Level of Consciousness (Pre- Awake and Alert procedure): Pre-procedure Verification/Time Out Yes - 14:52 Taken: Pain Control: Lidocaine 4% Topical Solution Total Area Debrided (L x W): 0.9 (cm) x 1.6 (cm) = 1.44 (cm) Tissue and other material Non-Viable, Slough, Subcutaneous, Slough debrided: Level: Skin/Subcutaneous Tissue Debridement Description: Excisional Instrument: Curette Bleeding: Minimum Hemostasis Achieved: Pressure Response to Treatment: Procedure was tolerated well Level of Consciousness (Post- Awake and Alert procedure): Post Debridement Measurements of Total Wound Length: (cm) 0.9 Width: (cm)  1.6 Depth: (cm) 0.1 Volume: (cm) 0.113 Character of Wound/Ulcer Post Debridement: Stable Post Procedure Diagnosis Same as Pre-procedure Electronic Signature(s) Signed: 06/27/2022 3:09:11 PM By: Geralyn Corwin DO Signed: 06/27/2022 3:54:56 PM By: Angelina Pih Entered By: Angelina Pih on 06/27/2022 14:54:22 Brian Dodson ([id number]) -------------------------------------------------------------------------------- HPI Details Patient Name: Brian Dodson Date of Service: 06/27/2022 2:30 PM Medical Record Number: [id number] Patient Account Number: [id number] Date of Birth/Sex: August 11, 1956 (65 y.o. M) Treating RN: Angelina Pih Primary Care Provider: Donnel Saxon Other Clinician: Referring Provider: Gwenevere Ghazi Treating Provider/Extender: Tilda Franco in Treatment: 0 History of Present Illness HPI Description: Admission 06/27/2022 [name] is a 66 year old male with a past medical history of schizophrenia, type 2 diabetes Well-controlled on metformin, dementia on Aricept and A-fib on Eliquis that presents to the clinic for 2-week history of nonhealing wound to his left knee. He resides at [facility]. Tammy, a staff member from the group home is present during the encounter and helps provide the history. The patient visited the ED on 7/27 for increased swelling and pain to the left lower extremity. He had a wound present to the left knee at that time. He was admitted for lower extremity cellulitis. He was started on vancomycin and ceftriaxone and discharged on doxycycline and cefdinir and completed this course on 8/6. He is unable to tell me exactly how or when the wound started. But he did state he was doing an [employer] crawl And this may have been how the wound started. He has dementia and it is hard to follow his train of thought. He has been using medihoney to the wound bed. He currently denies signs of infection. Electronic Signature(s) Signed: 06/27/2022  3:09:11 PM By: Geralyn Corwin DO Entered By: Geralyn Corwin on 06/27/2022 15:02:00 Brian Dodson ([id number]) -------------------------------------------------------------------------------- Physical Exam Details Patient Name: Brian Dodson Date of Service: 06/27/2022 2:30 PM Medical Record Number: [id number] Patient Account Number: [id number] Date of Birth/Sex: 06-06-1956 (65 y.o. M) Treating RN: Angelina Pih Primary Care Provider: Donnel Saxon Other Clinician: Referring Provider:  Catalina Lunger, JENNIFER Treating Provider/Extender: Tilda Franco in Treatment: 0 Constitutional . Cardiovascular . Psychiatric . Notes Left lower extremity: To the knee there is an open wound with nonviable tissue tightly adhered. No signs of surrounding soft tissue infection. Electronic Signature(s) Signed: 06/27/2022 3:09:11 PM By: Geralyn Corwin DO Entered By: Geralyn Corwin on 06/27/2022 15:02:23 Brian Dodson ([id number]) -------------------------------------------------------------------------------- Physician Orders Details Patient Name: Brian Dodson Date of Service: 06/27/2022 2:30 PM Medical Record Number: [id number] Patient Account Number: [id number] Date of Birth/Sex: [birth year] (65 y.o. M) Treating RN: Angelina Pih Primary Care Provider: Donnel Saxon Other Clinician: Referring Provider: Gwenevere Ghazi Treating Provider/Extender: Tilda Franco in Treatment: 0 Verbal / Phone Orders: No Diagnosis Coding ICD-10 Coding Code Description [id number] Non-pressure chronic ulcer of other part of left lower leg with fat layer exposed E11.622 Type 2 diabetes mellitus with other skin ulcer J44.9 Chronic obstructive pulmonary disease, unspecified I48.91 Unspecified atrial fibrillation Follow-up Appointments o Return Appointment in 2 weeks. o Nurse Visit as needed Home Health o Home Health Company: - [facility] o [facility] for wound care. May utilize  formulary equivalent dressing for wound treatment orders unless otherwise specified. Home Health Nurse may visit PRN to address patientos wound care needs. o Scheduled days for dressing changes to be completed; exception, patient has scheduled wound care visit that day. o **Please direct any NON-WOUND related issues/requests for orders to patient's Primary Care Physician. **If current dressing causes regression in wound condition, may D/C ordered dressing product/s and apply Normal Saline Moist Dressing daily until next Wound Healing Center or Other MD appointment. **Notify Wound Healing Center of regression in wound condition at [phone]. Bathing/ Shower/ Hygiene o Wash wounds with antibacterial soap and water. o May shower; gently cleanse wound with antibacterial soap, rinse and pat dry prior to dressing wounds o No tub bath. Edema Control - Lymphedema / Segmental Compressive Device / Other o Elevate, Exercise Daily and Avoid Standing for Long Periods of Time. o Elevate leg(s) parallel to the floor when sitting. o DO YOUR BEST to sleep in the bed at night. DO NOT sleep in your recliner. Long hours of sitting in a recliner leads to swelling of the legs and/or potential wounds on your backside. Wound Treatment Wound #1 - Knee Wound Laterality: Left Cleanser: Soap and Water Every Other Day/30 Days Discharge Instructions: Gently cleanse wound with antibacterial soap, rinse and pat dry prior to dressing wounds Topical: Santyl Collagenase Ointment, 30 (gm), tube Every Other Day/30 Days Discharge Instructions: apply nickel thick to wound bed only Primary Dressing: Hydrofera Blue Ready Transfer Foam, 2.5x2.5 (in/in) Every Other Day/30 Days Discharge Instructions: Apply Hydrofera Blue Ready to wound bed as directed Secondary Dressing: (SILICONE BORDER) Zetuvit Plus SILICONE BORDER Dressing 4x4 (in/in) Every Other Day/30 Days Discharge Instructions: Please do not put silicone  bordered dressings under wraps. Use non-bordered dressing only. Patient Medications Allergies: Sulfa (Sulfonamide Antibiotics), Xarelto Notifications Medication Indication Start End Santyl 06/27/2022 DOSE 1 - topical 250 unit/gram ointment - Apply daily to the wound bed ADONTE, VANRIPER ([id number]) Electronic Signature(s) Signed: 06/27/2022 3:07:20 PM By: Geralyn Corwin DO Entered By: Geralyn Corwin on 06/27/2022 15:07:20 Brian Dodson ([id number]) -------------------------------------------------------------------------------- Problem List Details Patient Name: Brian Dodson Date of Service: 06/27/2022 2:30 PM Medical Record Number: [id number] Patient Account Number: [id number] Date of Birth/Sex: [birth year] (65 y.o. M) Treating RN: Angelina Pih Primary Care Provider: Donnel Saxon Other Clinician: Referring Provider: Gwenevere Ghazi Treating Provider/Extender: Tilda Franco in Treatment: 0 Active Problems ICD-10  Encounter Code Description Active Date MDM Diagnosis S81.802A Unspecified open wound, left lower leg, initial encounter 06/27/2022 No Yes E11.622 Type 2 diabetes mellitus with other skin ulcer 06/27/2022 No Yes J44.9 Chronic obstructive pulmonary disease, unspecified 06/27/2022 No Yes I48.91 Unspecified atrial fibrillation 06/27/2022 No Yes Inactive Problems Resolved Problems Electronic Signature(s) Signed: 06/27/2022 3:09:11 PM By: Geralyn Corwin DO Entered By: Geralyn Corwin on 06/27/2022 15:07:58 Brian Dodson ([id number]) -------------------------------------------------------------------------------- Progress Note Details Patient Name: Brian Dodson Date of Service: 06/27/2022 2:30 PM Medical Record Number: [id number] Patient Account Number: [id number] Date of Birth/Sex: 1956-08-02 (65 y.o. M) Treating RN: Angelina Pih Primary Care Provider: Donnel Saxon Other Clinician: Referring Provider: Gwenevere Ghazi Treating Provider/Extender: Tilda Franco in Treatment: 0 Subjective Chief Complaint Information obtained from Patient 06/27/2022; the left knee wound History of Present Illness (HPI) Admission 06/27/2022 [name] is a 66 year old male with a past medical history of schizophrenia, type 2 diabetes Well-controlled on metformin, dementia on Aricept and A-fib on Eliquis that presents to the clinic for 2-week history of nonhealing wound to his left knee. He resides at [facility]. Tammy, a staff member from the group home is present during the encounter and helps provide the history. The patient visited the ED on 7/27 for increased swelling and pain to the left lower extremity. He had a wound present to the left knee at that time. He was admitted for lower extremity cellulitis. He was started on vancomycin and ceftriaxone and discharged on doxycycline and cefdinir and completed this course on 8/6. He is unable to tell me exactly how or when the wound started. But he did state he was doing an [employer] crawl And this may have been how the wound started. He has dementia and it is hard to follow his train of thought. He has been using medihoney to the wound bed. He currently denies signs of infection. Patient History Allergies Sulfa (Sulfonamide Antibiotics), Xarelto Social History Former smoker, Alcohol Use - Never, Drug Use - No History, Caffeine Use - Daily - coffee, soda. Medical History Hematologic/Lymphatic Patient has history of Anemia Respiratory Patient has history of Chronic Obstructive Pulmonary Disease (COPD) Cardiovascular Patient has history of Arrhythmia - a fib, Coronary Artery Disease, Hypertension Endocrine Patient has history of Type II Diabetes Integumentary (Skin) Patient has history of History of pressure wounds Neurologic Patient has history of Dementia - vascular Patient is treated with Oral Agents. Blood sugar is not tested. Review of Systems (ROS) Constitutional Symptoms (General  Health) Denies complaints or symptoms of Fatigue, Fever, Chills, Marked Weight Change. Eyes Denies complaints or symptoms of Dry Eyes, Vision Changes, Glasses / Contacts. Ear/Nose/Mouth/Throat Denies complaints or symptoms of Difficult clearing ears, Sinusitis. Gastrointestinal Denies complaints or symptoms of Frequent diarrhea, Nausea, Vomiting. Genitourinary chronic kidney disease Immunological Denies complaints or symptoms of Hives, Itching. Musculoskeletal Denies complaints or symptoms of Muscle Pain, Muscle Weakness. Psychiatric schizophrenia Brian Dodson, ATTRIDGE ([id number]) Objective Constitutional Vitals Time Taken: 2:15 PM, Height: 75 in, Source: Stated, Weight: 220 lbs, Source: Stated, BMI: 27.5, Temperature: 98.1 F, Pulse: 61 bpm, Respiratory Rate: 18 breaths/min, Blood Pressure: 129/76 mmHg. General Notes: Left lower extremity: To the knee there is an open wound with nonviable tissue tightly adhered. No signs of surrounding soft tissue infection. Integumentary (Hair, Skin) Wound #1 status is Open. Original cause of wound was Gradually Appeared. The date acquired was: 06/14/2022. The wound is located on the Left Knee. The wound measures 0.9cm length x 1.6cm width x 0.1cm depth;  1.131cm^2 area and 0.113cm^3 volume. There is no tunneling or undermining noted. There is a medium amount of serosanguineous drainage noted. There is no granulation within the wound bed. There is a large (67-100%) amount of necrotic tissue within the wound bed including Adherent Slough. Assessment Active Problems ICD-10 Non-pressure chronic ulcer of other part of left lower leg with fat layer exposed Type 2 diabetes mellitus with other skin ulcer Chronic obstructive pulmonary disease, unspecified Unspecified atrial fibrillation Patient presents with a 2-week history of nonhealing wound to his left knee, likely from trauma. He has been treated for cellulitis and completed a 10-day course of oral  antibiotics with doxycycline and cefdinir. No signs of infection on exam. He has tightly adherent nonviable tissue and I debrided this in office. At this time I recommended he use Santyl and Hydrofera Blue with dressing changes. He has home health who does the dressing changes 3-4 times a week. I recommended he not place any pressure to the wound site. Follow-up in 2 weeks. 46 minutes was spent on the encounter including face-to-face, EMR review and coronation of care. Procedures Wound #1 Pre-procedure diagnosis of Wound #1 is a Cellulitis located on the Left Knee . There was a Excisional Skin/Subcutaneous Tissue Debridement with a total area of 1.44 sq cm performed by Geralyn Corwin, MD. With the following instrument(s): Curette to remove Non-Viable tissue/material. Material removed includes Subcutaneous Tissue and Slough and after achieving pain control using Lidocaine 4% Topical Solution. No specimens were taken. A time out was conducted at 14:52, prior to the start of the procedure. A Minimum amount of bleeding was controlled with Pressure. The procedure was tolerated well. Post Debridement Measurements: 0.9cm length x 1.6cm width x 0.1cm depth; 0.113cm^3 volume. Character of Wound/Ulcer Post Debridement is stable. Post procedure Diagnosis Wound #1: Same as Pre-Procedure Plan Follow-up Appointments: Return Appointment in 2 weeks. Nurse Visit as needed Home Health: Home Health Company: - [phone] [facility] for wound care. May utilize formulary equivalent dressing for wound treatment orders unless otherwise specified. Home Health Nurse may visit PRN to address patient s wound care needs. Scheduled days for dressing changes to be completed; exception, patient has scheduled wound care visit that day. **Please direct any NON-WOUND related issues/requests for orders to patient's Primary Care Physician. **If current dressing causes regression in wound condition, may D/C ordered  dressing product/s and apply Normal Saline Moist Dressing daily until next Wound Healing Center or Other MD appointment. **Notify Wound Healing Center of regression in wound condition at [phone]. Brian Dodson, Brian Dodson ([id number]) Bathing/ Shower/ Hygiene: Wash wounds with antibacterial soap and water. May shower; gently cleanse wound with antibacterial soap, rinse and pat dry prior to dressing wounds No tub bath. Edema Control - Lymphedema / Segmental Compressive Device / Other: Elevate, Exercise Daily and Avoid Standing for Long Periods of Time. Elevate leg(s) parallel to the floor when sitting. DO YOUR BEST to sleep in the bed at night. DO NOT sleep in your recliner. Long hours of sitting in a recliner leads to swelling of the legs and/or potential wounds on your backside. WOUND #1: - Knee Wound Laterality: Left Cleanser: Soap and Water Every Other Day/30 Days Discharge Instructions: Gently cleanse wound with antibacterial soap, rinse and pat dry prior to dressing wounds Topical: Santyl Collagenase Ointment, 30 (gm), tube Every Other Day/30 Days Discharge Instructions: apply nickel thick to wound bed only Primary Dressing: Hydrofera Blue Ready Transfer Foam, 2.5x2.5 (in/in) Every Other Day/30 Days Discharge Instructions: Apply Hydrofera Blue Ready  to wound bed as directed Secondary Dressing: (SILICONE BORDER) Zetuvit Plus SILICONE BORDER Dressing 4x4 (in/in) Every Other Day/30 Days Discharge Instructions: Please do not put silicone bordered dressings under wraps. Use non-bordered dressing only. 1. In office sharp debridement 2. Santyl and Hydrofera Blue with dressing changes 3. Follow-up in 2 weeks Electronic Signature(s) Signed: 06/27/2022 3:09:11 PM By: Geralyn CorwinHoffman, Shila Kruczek DO Entered By: Geralyn CorwinHoffman, Curry Dulski on 06/27/2022 15:05:03 Brian Dodson, Brian H. ([id number]) -------------------------------------------------------------------------------- ROS/PFSH Details Patient Name: Brian Dodson, Brian H. Date  of Service: 06/27/2022 2:30 PM Medical Record Number: [id number] Patient Account Number: [id number] Date of Birth/Sex: 06-Oct-1956 (65 y.o. M) Treating RN: Angelina PihGordon, Caitlin Primary Care Provider: Donnel SaxonHaque, Imran Other Clinician: Referring Provider: Gwenevere GhaziHAGAN, JENNIFER Treating Provider/Extender: Tilda FrancoHoffman, Randall Colden Weeks in Treatment: 0 Constitutional Symptoms (General Health) Complaints and Symptoms: Negative for: Fatigue; Fever; Chills; Marked Weight Change Eyes Complaints and Symptoms: Negative for: Dry Eyes; Vision Changes; Glasses / Contacts Ear/Nose/Mouth/Throat Complaints and Symptoms: Negative for: Difficult clearing ears; Sinusitis Gastrointestinal Complaints and Symptoms: Negative for: Frequent diarrhea; Nausea; Vomiting Immunological Complaints and Symptoms: Negative for: Hives; Itching Musculoskeletal Complaints and Symptoms: Negative for: Muscle Pain; Muscle Weakness Hematologic/Lymphatic Medical History: Positive for: Anemia Respiratory Medical History: Positive for: Chronic Obstructive Pulmonary Disease (COPD) Cardiovascular Medical History: Positive for: Arrhythmia - a fib; Coronary Artery Disease; Hypertension Endocrine Medical History: Positive for: Type II Diabetes Treated with: Oral agents Blood sugar tested every day: No Genitourinary Complaints and Symptoms: Review of System Notes: chronic kidney disease Brian Dodson, Brian H. ([id number]) Integumentary (Skin) Medical History: Positive for: History of pressure wounds Neurologic Medical History: Positive for: Dementia - vascular Oncologic Psychiatric Complaints and Symptoms: Review of System Notes: schizophrenia Immunizations Pneumococcal Vaccine: Received Pneumococcal Vaccination: Yes Received Pneumococcal Vaccination On or After 60th Birthday: Yes Implantable Devices None Family and Social History Former smoker; Alcohol Use: Never; Drug Use: No History; Caffeine Use: Daily - coffee, soda Electronic  Signature(s) Signed: 06/27/2022 3:09:11 PM By: Geralyn CorwinHoffman, Nekia Maxham DO Signed: 06/27/2022 3:54:56 PM By: Angelina PihGordon, Caitlin Entered By: Angelina PihGordon, Caitlin on 06/27/2022 14:23:44 Brian Dodson, Brian H. ([id number]) -------------------------------------------------------------------------------- SuperBill Details Patient Name: Brian Dodson, Brian H. Date of Service: 06/27/2022 Medical Record Number: [id number] Patient Account Number: [id number] Date of Birth/Sex: 06-Oct-1956 (65 y.o. M) Treating RN: Angelina PihGordon, Caitlin Primary Care Provider: Donnel SaxonHaque, Imran Other Clinician: Referring Provider: Gwenevere GhaziHAGAN, JENNIFER Treating Provider/Extender: Tilda FrancoHoffman, Anari Evitt Weeks in Treatment: 0 Diagnosis Coding ICD-10 Codes Code Description [phone] Unspecified open wound, left lower leg, initial encounter E11.622 Type 2 diabetes mellitus with other skin ulcer J44.9 Chronic obstructive pulmonary disease, unspecified I48.91 Unspecified atrial fibrillation Facility Procedures CPT4 Code: [id number] Description: 99213 - WOUND CARE VISIT-LEV 3 EST PT Modifier: Quantity: 1 CPT4 Code: [id number] Description: 11042 - DEB SUBQ TISSUE 20 SQ CM/< Modifier: Quantity: 1 CPT4 Code: Description: ICD-10 Diagnosis Description S81.802A Unspecified open wound, left lower leg, initial encounter Modifier: Quantity: Physician Procedures CPT4 Code: [id number] Description: 99204 - WC PHYS LEVEL 4 - NEW PT Modifier: Quantity: 1 CPT4 Code: Description: ICD-10 Diagnosis Description S81.802A Unspecified open wound, left lower leg, initial encounter E11.622 Type 2 diabetes mellitus with other skin ulcer J44.9 Chronic obstructive pulmonary disease, unspecified I48.91 Unspecified atrial  fibrillation Modifier: Quantity: CPT4 Code: [id number] Description: 11042 - WC PHYS SUBQ TISS 20 SQ CM Modifier: Quantity: 1 CPT4 Code: Description: ICD-10 Diagnosis Description S81.802A Unspecified open wound, left lower leg, initial encounter Modifier: Quantity: Electronic  Signature(s) Signed: 06/27/2022 3:22:56 PM By: Angelina PihGordon, Caitlin Signed: 06/27/2022 3:36:12 PM By: Geralyn CorwinHoffman, Ronav Furney DO Previous Signature: 06/27/2022 3:09:11 PM Version By: Geralyn CorwinHoffman, Cyndel Griffey DO Entered  By: Angelina Pih on 06/27/2022 15:22:56

## 2022-07-11 ENCOUNTER — Encounter (HOSPITAL_BASED_OUTPATIENT_CLINIC_OR_DEPARTMENT_OTHER): Payer: Medicare Other | Admitting: Internal Medicine

## 2022-07-11 DIAGNOSIS — S81802A Unspecified open wound, left lower leg, initial encounter: Secondary | ICD-10-CM

## 2022-07-11 DIAGNOSIS — E11622 Type 2 diabetes mellitus with other skin ulcer: Secondary | ICD-10-CM | POA: Diagnosis not present

## 2022-07-12 NOTE — Progress Notes (Signed)
Brian, Dodson ([id number]) Visit Report for 07/11/2022 Chief Complaint Document Details Patient Name: Brian Dodson, Brian Dodson Date of Service: 07/11/2022 9:45 AM Medical Record Number: [id number] Patient Account Number: [id number] Date of Birth/Sex: [birth year] (65 y.o. M) Treating RN: Yevonne Pax Primary Care Provider: Donnel Saxon Other Clinician: Betha Loa Referring Provider: Donnel Saxon Treating Provider/Extender: Tilda Franco in Treatment: 2 Information Obtained from: Patient Chief Complaint 06/27/2022; the left knee wound Electronic Signature(s) Signed: 07/11/2022 10:40:44 AM By: Geralyn Corwin DO Entered By: Geralyn Corwin on 07/11/2022 10:36:17 Ricki Dodson ([id number]) -------------------------------------------------------------------------------- Debridement Details Patient Name: Ricki Dodson Date of Service: 07/11/2022 9:45 AM Medical Record Number: [id number] Patient Account Number: [id number] Date of Birth/Sex: Jun 26, 1956 (65 y.o. M) Treating RN: Yevonne Pax Primary Care Provider: Donnel Saxon Other Clinician: Betha Loa Referring Provider: Donnel Saxon Treating Provider/Extender: Tilda Franco in Treatment: 2 Debridement Performed for Wound #1 Left Knee Assessment: Performed By: Physician Geralyn Corwin, MD Debridement Type: Debridement Level of Consciousness (Pre- Awake and Alert procedure): Pre-procedure Verification/Time Out Yes - 10:17 Taken: Start Time: 10:17 Total Area Debrided (L x W): 0.6 (cm) x 1.4 (cm) = 0.84 (cm) Tissue and other material Viable, Non-Viable, Slough, Subcutaneous, Slough debrided: Level: Skin/Subcutaneous Tissue Debridement Description: Excisional Instrument: Curette Bleeding: Minimum Hemostasis Achieved: Pressure End Time: 10:19 Response to Treatment: Procedure was tolerated well Level of Consciousness (Post- Awake and Alert procedure): Post Debridement Measurements of Total Wound Length:  (cm) 0.6 Width: (cm) 1.4 Depth: (cm) 0.3 Volume: (cm) 0.198 Character of Wound/Ulcer Post Debridement: Stable Post Procedure Diagnosis Same as Pre-procedure Electronic Signature(s) Signed: 07/11/2022 10:40:44 AM By: Geralyn Corwin DO Signed: 07/11/2022 11:54:59 AM By: Betha Loa Signed: 07/12/2022 4:19:23 PM By: Yevonne Pax RN Entered By: Betha Loa on 07/11/2022 10:19:32 Ricki Dodson ([id number]) -------------------------------------------------------------------------------- HPI Details Patient Name: Ricki Dodson Date of Service: 07/11/2022 9:45 AM Medical Record Number: [id number] Patient Account Number: [id number] Date of Birth/Sex: 11-04-1956 (65 y.o. M) Treating RN: Yevonne Pax Primary Care Provider: Donnel Saxon Other Clinician: Betha Loa Referring Provider: Donnel Saxon Treating Provider/Extender: Tilda Franco in Treatment: 2 History of Present Illness HPI Description: Admission 06/27/2022 Brian Dodson is a 66 year old male with a past medical history of schizophrenia, type 2 diabetes Well-controlled on metformin, dementia on Aricept and A-fib on Eliquis that presents to the clinic for 2-week history of nonhealing wound to his left knee. He resides at [facility]. Tammy, a staff member from the group home is present during the encounter and helps provide the history. The patient visited the ED on 7/27 for increased swelling and pain to the left lower extremity. He had a wound present to the left knee at that time. He was admitted for lower extremity cellulitis. He was started on vancomycin and ceftriaxone and discharged on doxycycline and cefdinir and completed this course on 8/6. He is unable to tell me exactly how or when the wound started. But he did state he was doing an [employer] crawl And this may have been how the wound started. He has dementia and it is hard to follow his train of thought. He has been using medihoney to the wound  bed. He currently denies signs of infection. 8/23; patient presents for follow-up. Home health comes out every other day to change the dressing. At times it appears that he is taking the dressing off between these changes. Dementia and schizophrenia are playing a role with his ability for compliance. We have been using Hydrofera Blue and  Santyl. He has no issues or complaints today. He denies signs of infection. Electronic Signature(s) Signed: 07/11/2022 10:40:44 AM By: Geralyn Corwin DO Entered By: Geralyn Corwin on 07/11/2022 10:38:03 Ricki Dodson ([id number]) -------------------------------------------------------------------------------- Physical Exam Details Patient Name: Ricki Dodson Date of Service: 07/11/2022 9:45 AM Medical Record Number: [id number] Patient Account Number: [id number] Date of Birth/Sex: 1956/10/20 (65 y.o. M) Treating RN: Yevonne Pax Primary Care Provider: Donnel Saxon Other Clinician: Betha Loa Referring Provider: Donnel Saxon Treating Provider/Extender: Tilda Franco in Treatment: 2 Constitutional . Psychiatric . Notes Left lower extremity: To the knee there is an open wound with Granulation tissue and nonviable tissue tightly adhered. No signs of surrounding soft tissue infection. Electronic Signature(s) Signed: 07/11/2022 10:40:44 AM By: Geralyn Corwin DO Entered By: Geralyn Corwin on 07/11/2022 10:38:29 Ricki Dodson ([id number]) -------------------------------------------------------------------------------- Physician Orders Details Patient Name: Ricki Dodson Date of Service: 07/11/2022 9:45 AM Medical Record Number: [id number] Patient Account Number: [id number] Date of Birth/Sex: November 03, 1956 (65 y.o. M) Treating RN: Yevonne Pax Primary Care Provider: Donnel Saxon Other Clinician: Betha Loa Referring Provider: Donnel Saxon Treating Provider/Extender: Tilda Franco in Treatment: 2 Verbal / Phone Orders:  No Diagnosis Coding Follow-up Appointments o Return Appointment in 2 weeks. o Nurse Visit as needed Home Health o Home Health Company: - [facility] o [facility] for wound care. May utilize formulary equivalent dressing for wound treatment orders unless otherwise specified. Home Health Nurse may visit PRN to address patientos wound care needs. o Scheduled days for dressing changes to be completed; exception, patient has scheduled wound care visit that day. o **Please direct any NON-WOUND related issues/requests for orders to patient's Primary Care Physician. **If current dressing causes regression in wound condition, may D/C ordered dressing product/s and apply Normal Saline Moist Dressing daily until next Wound Healing Center or Other MD appointment. **Notify Wound Healing Center of regression in wound condition at [phone]. Bathing/ Shower/ Hygiene o Wash wounds with antibacterial soap and water. o May shower; gently cleanse wound with antibacterial soap, rinse and pat dry prior to dressing wounds o No tub bath. Edema Control - Lymphedema / Segmental Compressive Device / Other o Elevate, Exercise Daily and Avoid Standing for Long Periods of Time. o Elevate leg(s) parallel to the floor when sitting. o DO YOUR BEST to sleep in the bed at night. DO NOT sleep in your recliner. Long hours of sitting in a recliner leads to swelling of the legs and/or potential wounds on your backside. Wound Treatment Wound #1 - Knee Wound Laterality: Left Cleanser: Soap and Water Every Other Day/30 Days Discharge Instructions: Gently cleanse wound with antibacterial soap, rinse and pat dry prior to dressing wounds Topical: Santyl Collagenase Ointment, 30 (gm), tube Every Other Day/30 Days Discharge Instructions: apply nickel thick to wound bed only Primary Dressing: Hydrofera Blue Ready Transfer Foam, 2.5x2.5 (in/in) Every Other Day/30 Days Discharge Instructions: Apply  Hydrofera Blue Ready to wound bed as directed Secondary Dressing: (BORDER) Zetuvit Plus SILICONE BORDER Dressing 4x4 (in/in) Every Other Day/30 Days Discharge Instructions: Please do not put silicone bordered dressings under wraps. Use non-bordered dressing only. Electronic Signature(s) Signed: 07/11/2022 10:40:44 AM By: Geralyn Corwin DO Entered By: Geralyn Corwin on 07/11/2022 10:40:08 Ricki Dodson ([id number]) -------------------------------------------------------------------------------- Problem List Details Patient Name: Ricki Dodson Date of Service: 07/11/2022 9:45 AM Medical Record Number: [id number] Patient Account Number: [id number] Date of Birth/Sex: 08/12/1956 (65 y.o. M) Treating RN: Yevonne Pax Primary Care Provider: Donnel Saxon Other Clinician: Betha Loa Referring Provider:  Donnel Saxon Treating Provider/Extender: Tilda Franco in Treatment: 2 Active Problems ICD-10 Encounter Code Description Active Date MDM Diagnosis S81.802A Unspecified open wound, left lower leg, initial encounter 06/27/2022 No Yes E11.622 Type 2 diabetes mellitus with other skin ulcer 06/27/2022 No Yes J44.9 Chronic obstructive pulmonary disease, unspecified 06/27/2022 No Yes I48.91 Unspecified atrial fibrillation 06/27/2022 No Yes Inactive Problems Resolved Problems Electronic Signature(s) Signed: 07/11/2022 10:40:44 AM By: Geralyn Corwin DO Entered By: Geralyn Corwin on 07/11/2022 10:36:13 Ricki Dodson ([id number]) -------------------------------------------------------------------------------- Progress Note Details Patient Name: Ricki Dodson Date of Service: 07/11/2022 9:45 AM Medical Record Number: [id number] Patient Account Number: [id number] Date of Birth/Sex: 14-Dec-1955 (65 y.o. M) Treating RN: Yevonne Pax Primary Care Provider: Donnel Saxon Other Clinician: Betha Loa Referring Provider: Donnel Saxon Treating Provider/Extender: Tilda Franco in  Treatment: 2 Subjective Chief Complaint Information obtained from Patient 06/27/2022; the left knee wound History of Present Illness (HPI) Admission 06/27/2022 Mr. Frenchie Dangerfield is a 66 year old male with a past medical history of schizophrenia, type 2 diabetes Well-controlled on metformin, dementia on Aricept and A-fib on Eliquis that presents to the clinic for 2-week history of nonhealing wound to his left knee. He resides at [facility]. Tammy, a staff member from the group home is present during the encounter and helps provide the history. The patient visited the ED on 7/27 for increased swelling and pain to the left lower extremity. He had a wound present to the left knee at that time. He was admitted for lower extremity cellulitis. He was started on vancomycin and ceftriaxone and discharged on doxycycline and cefdinir and completed this course on 8/6. He is unable to tell me exactly how or when the wound started. But he did state he was doing an [employer] crawl And this may have been how the wound started. He has dementia and it is hard to follow his train of thought. He has been using medihoney to the wound bed. He currently denies signs of infection. 8/23; patient presents for follow-up. Home health comes out every other day to change the dressing. At times it appears that he is taking the dressing off between these changes. Dementia and schizophrenia are playing a role with his ability for compliance. We have been using Hydrofera Blue and Santyl. He has no issues or complaints today. He denies signs of infection. Objective Constitutional Vitals Time Taken: 10:03 AM, Height: 75 in, Weight: 220 lbs, BMI: 27.5, Temperature: 98.4 F, Pulse: 46 bpm, Respiratory Rate: 18 breaths/min, Blood Pressure: 131/74 mmHg. General Notes: Left lower extremity: To the knee there is an open wound with Granulation tissue and nonviable tissue tightly adhered. No signs of surrounding soft tissue  infection. Integumentary (Hair, Skin) Wound #1 status is Open. Original cause of wound was Gradually Appeared. The date acquired was: 06/14/2022. The wound has been in treatment 2 weeks. The wound is located on the Left Knee. The wound measures 0.6cm length x 1.4cm width x 0.1cm depth; 0.66cm^2 area and 0.066cm^3 volume. There is Fat Layer (Subcutaneous Tissue) exposed. There is a medium amount of serosanguineous drainage noted. There is no granulation within the wound bed. There is a large (67-100%) amount of necrotic tissue within the wound bed including Adherent Slough. Assessment Active Problems ICD-10 Unspecified open wound, left lower leg, initial encounter Type 2 diabetes mellitus with other skin ulcer Chronic obstructive pulmonary disease, unspecified Unspecified atrial fibrillation Brayfield, Shameer H. ([id number]) Patient's wound has shown improvement in size and appearance since last clinic visit. I  debrided nonviable tissue. I recommended continuing the course with Santyl and Hydrofera Blue. I recommended he keep the dressing on unless he takes a shower. I asked that he do the shower before home health does the dressing change. He expressed understanding. Follow-up in 2 weeks. Procedures Wound #1 Pre-procedure diagnosis of Wound #1 is a Cellulitis located on the Left Knee . There was a Excisional Skin/Subcutaneous Tissue Debridement with a total area of 0.84 sq cm performed by Geralyn Corwin, MD. With the following instrument(s): Curette to remove Viable and Non-Viable tissue/material. Material removed includes Subcutaneous Tissue and Slough and. A time out was conducted at 10:17, prior to the start of the procedure. A Minimum amount of bleeding was controlled with Pressure. The procedure was tolerated well. Post Debridement Measurements: 0.6cm length x 1.4cm width x 0.3cm depth; 0.198cm^3 volume. Character of Wound/Ulcer Post Debridement is stable. Post procedure Diagnosis Wound #1:  Same as Pre-Procedure Plan Follow-up Appointments: Return Appointment in 2 weeks. Nurse Visit as needed Home Health: Home Health Company: - [phone] [facility] for wound care. May utilize formulary equivalent dressing for wound treatment orders unless otherwise specified. Home Health Nurse may visit PRN to address patient s wound care needs. Scheduled days for dressing changes to be completed; exception, patient has scheduled wound care visit that day. **Please direct any NON-WOUND related issues/requests for orders to patient's Primary Care Physician. **If current dressing causes regression in wound condition, may D/C ordered dressing product/s and apply Normal Saline Moist Dressing daily until next Wound Healing Center or Other MD appointment. **Notify Wound Healing Center of regression in wound condition at [phone]. Bathing/ Shower/ Hygiene: Wash wounds with antibacterial soap and water. May shower; gently cleanse wound with antibacterial soap, rinse and pat dry prior to dressing wounds No tub bath. Edema Control - Lymphedema / Segmental Compressive Device / Other: Elevate, Exercise Daily and Avoid Standing for Long Periods of Time. Elevate leg(s) parallel to the floor when sitting. DO YOUR BEST to sleep in the bed at night. DO NOT sleep in your recliner. Long hours of sitting in a recliner leads to swelling of the legs and/or potential wounds on your backside. WOUND #1: - Knee Wound Laterality: Left Cleanser: Soap and Water Every Other Day/30 Days Discharge Instructions: Gently cleanse wound with antibacterial soap, rinse and pat dry prior to dressing wounds Topical: Santyl Collagenase Ointment, 30 (gm), tube Every Other Day/30 Days Discharge Instructions: apply nickel thick to wound bed only Primary Dressing: Hydrofera Blue Ready Transfer Foam, 2.5x2.5 (in/in) Every Other Day/30 Days Discharge Instructions: Apply Hydrofera Blue Ready to wound bed as directed Secondary  Dressing: (BORDER) Zetuvit Plus SILICONE BORDER Dressing 4x4 (in/in) Every Other Day/30 Days Discharge Instructions: Please do not put silicone bordered dressings under wraps. Use non-bordered dressing only. 1. In office sharp debridement 2. Santyl and Hydrofera Blue 3. Follow-up in 2 weeks Electronic Signature(s) Signed: 07/11/2022 10:40:44 AM By: Geralyn Corwin DO Entered By: Geralyn Corwin on 07/11/2022 10:39:48 Ricki Dodson ([id number]) -------------------------------------------------------------------------------- ROS/PFSH Details Patient Name: Ricki Dodson Date of Service: 07/11/2022 9:45 AM Medical Record Number: [id number] Patient Account Number: [id number] Date of Birth/Sex: [birth year] (65 y.o. M) Treating RN: Yevonne Pax Primary Care Provider: Donnel Saxon Other Clinician: Betha Loa Referring Provider: Donnel Saxon Treating Provider/Extender: Tilda Franco in Treatment: 2 Hematologic/Lymphatic Medical History: Positive for: Anemia Respiratory Medical History: Positive for: Chronic Obstructive Pulmonary Disease (COPD) Cardiovascular Medical History: Positive for: Arrhythmia - a fib; Coronary Artery Disease; Hypertension Endocrine Medical History: Positive  for: Type II Diabetes Treated with: Oral agents Blood sugar tested every day: No Integumentary (Skin) Medical History: Positive for: History of pressure wounds Neurologic Medical History: Positive for: Dementia - vascular Immunizations Pneumococcal Vaccine: Received Pneumococcal Vaccination: Yes Received Pneumococcal Vaccination On or After 60th Birthday: Yes Implantable Devices None Family and Social History Former smoker; Alcohol Use: Never; Drug Use: No History; Caffeine Use: Daily - coffee, soda Electronic Signature(s) Signed: 07/11/2022 10:40:44 AM By: Geralyn CorwinHoffman, Dayven Linsley DO Signed: 07/12/2022 4:19:23 PM By: Yevonne PaxEpps, Carrie RN Entered By: Geralyn CorwinHoffman, Kora Groom on 07/11/2022 10:40:18 Ricki MillerJONES,  Sabrina H. ([id number]) -------------------------------------------------------------------------------- SuperBill Details Patient Name: Ricki MillerJONES, Sudais H. Date of Service: 07/11/2022 Medical Record Number: [id number] Patient Account Number: [id number] Date of Birth/Sex: 1956-10-07 (65 y.o. M) Treating RN: Yevonne PaxEpps, Carrie Primary Care Provider: Donnel SaxonHaque, Imran Other Clinician: Betha LoaVenable, Angie Referring Provider: Donnel SaxonHaque, Imran Treating Provider/Extender: Tilda FrancoHoffman, Caylor Cerino Weeks in Treatment: 2 Diagnosis Coding ICD-10 Codes Code Description [id number] Unspecified open wound, left lower leg, initial encounter E11.622 Type 2 diabetes mellitus with other skin ulcer J44.9 Chronic obstructive pulmonary disease, unspecified I48.91 Unspecified atrial fibrillation Facility Procedures CPT4 Code: 2440102736100012 Description: 11042 - DEB SUBQ TISSUE 20 SQ CM/< Modifier: Quantity: 1 CPT4 Code: Description: ICD-10 Diagnosis Description S81.802A Unspecified open wound, left lower leg, initial encounter E11.622 Type 2 diabetes mellitus with other skin ulcer Modifier: Quantity: Physician Procedures CPT4 Code: [id number] Description: 11042 - WC PHYS SUBQ TISS 20 SQ CM Modifier: Quantity: 1 CPT4 Code: Description: ICD-10 Diagnosis Description S81.802A Unspecified open wound, left lower leg, initial encounter E11.622 Type 2 diabetes mellitus with other skin ulcer Modifier: Quantity: Electronic Signature(s) Signed: 07/11/2022 10:40:44 AM By: Geralyn CorwinHoffman, Hailly Fess DO Entered By: Geralyn CorwinHoffman, Ciel Chervenak on 07/11/2022 10:40:00

## 2022-07-12 NOTE — Progress Notes (Signed)
Brian Dodson, Brian Dodson ([id number]) Visit Report for 07/11/2022 Arrival Information Details Patient Name: Brian Dodson, Brian Dodson Date of Service: 07/11/2022 9:45 AM Medical Record Number: [id number] Patient Account Number: [id number] Date of Birth/Sex: [birth year] (65 y.o. M) Treating RN: Yevonne Pax Primary Care Matricia Begnaud: Donnel Saxon Other Clinician: Betha Loa Referring Garron Eline: Donnel Saxon Treating Damar Petit/Extender: Tilda Franco in Treatment: 2 Visit Information History Since Last Visit All ordered tests and consults were completed: No Patient Arrived: Ambulatory Added or deleted any medications: No Arrival Time: 10:00 Any new allergies or adverse reactions: No Transfer Assistance: None Had a fall or experienced change in No Patient Has Alerts: Yes activities of daily living that may affect Patient Alerts: Patient on Blood Thinner risk of falls: Hospitalized since last visit: No Pain Present Now: No Electronic Signature(s) Signed: 07/11/2022 11:54:59 AM By: Betha Loa Entered By: Betha Loa on 07/11/2022 10:01:04 Brian Dodson ([id number]) -------------------------------------------------------------------------------- Clinic Level of Care Assessment Details Patient Name: Brian Dodson Date of Service: 07/11/2022 9:45 AM Medical Record Number: [id number] Patient Account Number: [id number] Date of Birth/Sex: [birth year] (65 y.o. M) Treating RN: Yevonne Pax Primary Care Jhamal Plucinski: Donnel Saxon Other Clinician: Betha Loa Referring Cande Mastropietro: Donnel Saxon Treating Mateja Dier/Extender: Tilda Franco in Treatment: 2 Clinic Level of Care Assessment Items TOOL 1 Quantity Score []  - Use when EandM and Procedure is performed on INITIAL visit 0 ASSESSMENTS - Nursing Assessment / Reassessment []  - General Physical Exam (combine w/ comprehensive assessment (listed just below) when performed on new 0 pt. evals) []  - 0 Comprehensive Assessment (HX, ROS, Risk  Assessments, Wounds Hx, etc.) ASSESSMENTS - Wound and Skin Assessment / Reassessment []  - Dermatologic / Skin Assessment (not related to wound area) 0 ASSESSMENTS - Ostomy and/or Continence Assessment and Care []  - Incontinence Assessment and Management 0 []  - 0 Ostomy Care Assessment and Management (repouching, etc.) PROCESS - Coordination of Care []  - Simple Patient / Family Education for ongoing care 0 []  - 0 Complex (extensive) Patient / Family Education for ongoing care []  - 0 Staff obtains , Records, Test Results / Process Orders []  - 0 Staff telephones HHA, Nursing Homes / Clarify orders / etc []  - 0 Routine Transfer to another Facility (non-emergent condition) []  - 0 Routine Hospital Admission (non-emergent condition) []  - 0 New Admissions / / Ordering NPWT, Apligraf, etc. []  - 0 Emergency Hospital Admission (emergent condition) PROCESS - Special Needs []  - Pediatric / Minor Patient Management 0 []  - 0 Isolation Patient Management []  - 0 Hearing / Language / Visual special needs []  - 0 Assessment of Community assistance (transportation, D/C planning, etc.) []  - 0 Additional assistance / Altered mentation []  - 0 Support Surface(s) Assessment (bed, cushion, seat, etc.) INTERVENTIONS - Miscellaneous []  - External ear exam 0 []  - 0 Patient Transfer (multiple staff / / Similar devices) []  - 0 Simple Staple / Suture removal (25 or less) []  - 0 Complex Staple / Suture removal (26 or more) []  - 0 Hypo/Hyperglycemic Management (do not check if billed separately) []  - 0 Ankle / Brachial Index (ABI) - do not check if billed separately Has the patient been seen at the hospital within the last three years: Yes Total Score: 0 Level Of Care: ____ ( ) Electronic Signature(s) Signed: 07/11/2022 11:54:59 AM By: Entered By: on 07/11/2022 10:20:03 Chiropractor  ( ) -------------------------------------------------------------------------------- Encounter Discharge Information Details Patient Name: Date of Service: 07/11/2022 9:45 AM  Medical Record Number: [id number] Patient Account Number: [id number] Date of Birth/Sex: 1956/09/29 (65 y.o. M) Treating RN: Yevonne Pax Primary Care Lyan Holck: Donnel Saxon Other Clinician: Betha Loa Referring Ramari Bray: Donnel Saxon Treating Lynn Recendiz/Extender: Tilda Franco in Treatment: 2 Encounter Discharge Information Items Post Procedure Vitals Discharge Condition: Stable Temperature (F): 98.4 Ambulatory Status: Ambulatory Pulse (bpm): 46 Discharge Destination: Skilled Nursing Facility Respiratory Rate (breaths/min): 18 Telephoned: Yes Blood Pressure (mmHg): 131/74 Orders Sent: Yes Transportation: Other Accompanied By: caregiver Schedule Follow-up Appointment: Yes Clinical Summary of Care: Electronic Signature(s) Signed: 07/11/2022 11:54:59 AM By: Betha Loa Entered By: Betha Loa on 07/11/2022 10:34:10 Brian Dodson ([id number]) -------------------------------------------------------------------------------- Lower Extremity Assessment Details Patient Name: Brian Dodson Date of Service: 07/11/2022 9:45 AM Medical Record Number: [id number] Patient Account Number: [id number] Date of Birth/Sex: 1956/01/21 (65 y.o. M) Treating RN: Yevonne Pax Primary Care Brytney Somes: Donnel Saxon Other Clinician: Betha Loa Referring Yatzari Jonsson: Donnel Saxon Treating Envi Eagleson/Extender: Tilda Franco in Treatment: 2 Edema Assessment Assessed: [Left: Yes] [Right: No] Edema: [Left: Ye] [Right: s] Calf Left: Right: Point of Measurement: 40 cm From Medial Instep 40.2 cm Ankle Left: Right: Point of Measurement: 11 cm From Medial Instep 25.4 cm Vascular Assessment Pulses: Dorsalis Pedis Palpable: [Left:Yes] Posterior Tibial Palpable: [Left:Yes] Electronic  Signature(s) Signed: 07/11/2022 11:54:59 AM By: Betha Loa Signed: 07/12/2022 4:19:23 PM By: Yevonne Pax RN Entered By: Betha Loa on 07/11/2022 10:12:10 Brian Dodson ([id number]) -------------------------------------------------------------------------------- Multi Wound Chart Details Patient Name: Brian Dodson Date of Service: 07/11/2022 9:45 AM Medical Record Number: [id number] Patient Account Number: [id number] Date of Birth/Sex: [birth year] (65 y.o. M) Treating RN: Yevonne Pax Primary Care Aloni Chuang: Donnel Saxon Other Clinician: Betha Loa Referring Raini Tiley: Donnel Saxon Treating Kelia Gibbon/Extender: Tilda Franco in Treatment: 2 Vital Signs Height(in): 75 Pulse(bpm): 46 Weight(lbs): 220 Blood Pressure(mmHg): 131/74 Body Mass Index(BMI): 27.5 Temperature(F): 98.4 Respiratory Rate(breaths/min): 18 Photos: [N/A:N/A] Wound Location: Left Knee N/A N/A Wounding Event: Gradually Appeared N/A N/A Primary Etiology: Cellulitis N/A N/A Comorbid History: Anemia, Chronic Obstructive N/A N/A Pulmonary Disease (COPD), Arrhythmia, Coronary Artery Disease, Hypertension, Type II Diabetes, History of pressure wounds, Dementia Date Acquired: 06/14/2022 N/A N/A Weeks of Treatment: 2 N/A N/A Wound Status: Open N/A N/A Wound Recurrence: No N/A N/A Measurements L x W x D (cm) 0.6x1.4x0.1 N/A N/A Area (cm) : 0.66 N/A N/A Volume (cm) : 0.066 N/A N/A % Reduction in Area: 41.60% N/A N/A % Reduction in Volume: 41.60% N/A N/A Classification: Full Thickness Without Exposed N/A N/A Support Structures Exudate Amount: Medium N/A N/A Exudate Type: Serosanguineous N/A N/A Exudate Color: red, brown N/A N/A Granulation Amount: None Present (0%) N/A N/A Necrotic Amount: Large (67-100%) N/A N/A Exposed Structures: Fat Layer (Subcutaneous Tissue): N/A N/A Yes Fascia: No Tendon: No Muscle: No Joint: No Bone: No Epithelialization: None N/A N/A Treatment  Notes Electronic Signature(s) Signed: 07/11/2022 11:54:59 AM By: Betha Loa Entered By: Betha Loa on 07/11/2022 10:12:32 Brian Dodson ([id number] Dodson ([id number]) -------------------------------------------------------------------------------- Multi-Disciplinary Care Plan Details Patient Name: Brian Dodson Date of Service: 07/11/2022 9:45 AM Medical Record Number: [id number] Patient Account Number: [id number] Date of Birth/Sex: Sep 25, 1956 (65 y.o. M) Treating RN: Yevonne Pax Primary Care Edis Huish: Donnel Saxon Other Clinician: Betha Loa Referring Sarha Bartelt: Donnel Saxon Treating Olliver Boyadjian/Extender: Tilda Franco in Treatment: 2 Active Inactive Orientation to the Wound Care Program Nursing Diagnoses: Knowledge deficit related to the wound healing center program Goals: Patient/caregiver will verbalize understanding of the Wound Healing Center Program Date Initiated: 06/27/2022 Target Resolution Date: 07/04/2022  Goal Status: Active Interventions: Provide education on orientation to the wound center Notes: Wound/Skin Impairment Nursing Diagnoses: Impaired tissue integrity Knowledge deficit related to ulceration/compromised skin integrity Goals: Ulcer/skin breakdown will have a volume reduction of 30% by week 4 Date Initiated: 06/27/2022 Target Resolution Date: 07/25/2022 Goal Status: Active Ulcer/skin breakdown will have a volume reduction of 50% by week 8 Date Initiated: 06/27/2022 Target Resolution Date: 08/22/2022 Goal Status: Active Ulcer/skin breakdown will have a volume reduction of 80% by week 12 Date Initiated: 06/27/2022 Target Resolution Date: 09/19/2022 Goal Status: Active Ulcer/skin breakdown will heal within 14 weeks Date Initiated: 06/27/2022 Target Resolution Date: 10/03/2022 Goal Status: Active Interventions: Assess patient/caregiver ability to obtain necessary supplies Assess patient/caregiver ability to perform ulcer/skin care  regimen upon admission and as needed Assess ulceration(s) every visit Provide education on ulcer and skin care Notes: Electronic Signature(s) Signed: 07/11/2022 11:54:59 AM By: Betha Loa Signed: 07/12/2022 4:19:23 PM By: Yevonne Pax RN Entered By: Betha Loa on 07/11/2022 10:12:18 Brian Dodson ([id number]) -------------------------------------------------------------------------------- Pain Assessment Details Patient Name: Brian Dodson Date of Service: 07/11/2022 9:45 AM Medical Record Number: [id number] Patient Account Number: [id number] Date of Birth/Sex: 1956-10-08 (65 y.o. M) Treating RN: Yevonne Pax Primary Care Tenlee Wollin: Donnel Saxon Other Clinician: Betha Loa Referring Charli Liberatore: Donnel Saxon Treating Nimrat Woolworth/Extender: Tilda Franco in Treatment: 2 Active Problems Location of Pain Severity and Description of Pain Patient Has Paino No Site Locations Pain Management and Medication Current Pain Management: Electronic Signature(s) Signed: 07/11/2022 11:54:59 AM By: Betha Loa Signed: 07/12/2022 4:19:23 PM By: Yevonne Pax RN Entered By: Betha Loa on 07/11/2022 10:04:00 Brian Dodson ([id number]) -------------------------------------------------------------------------------- Patient/Caregiver Education Details Patient Name: Brian Dodson Date of Service: 07/11/2022 9:45 AM Medical Record Number: [id number] Patient Account Number: [id number] Date of Birth/Gender: Apr 28, 1956 (65 y.o. M) Treating RN: Yevonne Pax Primary Care Physician: Donnel Saxon Other Clinician: Betha Loa Referring Physician: Donnel Saxon Treating Physician/Extender: Tilda Franco in Treatment: 2 Education Assessment Education Provided To: Patient Education Topics Provided Wound/Skin Impairment: Handouts: Other: continue wound care as directed Methods: Explain/Verbal Responses: State content correctly Electronic Signature(s) Signed: 07/11/2022  11:54:59 AM By: Betha Loa Entered By: Betha Loa on 07/11/2022 10:20:28 Brian Dodson ([id number]) -------------------------------------------------------------------------------- Wound Assessment Details Patient Name: Brian Dodson Date of Service: 07/11/2022 9:45 AM Medical Record Number: [id number] Patient Account Number: [id number] Date of Birth/Sex: 01-11-1956 (65 y.o. M) Treating RN: Yevonne Pax Primary Care Elizah Lydon: Donnel Saxon Other Clinician: Betha Loa Referring Juliana Boling: Donnel Saxon Treating Jestina Stephani/Extender: Tilda Franco in Treatment: 2 Wound Status Wound Number: 1 Primary Cellulitis Etiology: Wound Location: Left Knee Wound Open Wounding Event: Gradually Appeared Status: Date Acquired: 06/14/2022 Comorbid Anemia, Chronic Obstructive Pulmonary Disease (COPD), Weeks Of Treatment: 2 History: Arrhythmia, Coronary Artery Disease, Hypertension, Type Clustered Wound: No II Diabetes, History of pressure wounds, Dementia Photos Wound Measurements Length: (cm) 0.6 Width: (cm) 1.4 Depth: (cm) 0.1 Area: (cm) 0.66 Volume: (cm) 0.066 % Reduction in Area: 41.6% % Reduction in Volume: 41.6% Epithelialization: None Wound Description Classification: Full Thickness Without Exposed Support Structu Exudate Amount: Medium Exudate Type: Serosanguineous Exudate Color: red, brown res Foul Odor After Cleansing: No Slough/Fibrino Yes Wound Bed Granulation Amount: None Present (0%) Exposed Structure Necrotic Amount: Large (67-100%) Fascia Exposed: No Necrotic Quality: Adherent Slough Fat Layer (Subcutaneous Tissue) Exposed: Yes Tendon Exposed: No Muscle Exposed: No Joint Exposed: No Bone Exposed: No Treatment Notes Wound #1 (Knee) Wound Laterality: Left Cleanser Soap and Water Discharge Instruction: Gently cleanse wound with antibacterial soap, rinse  and pat dry prior to dressing wounds Peri-Wound Care Brian Dodson, Brian Dodson  ([id number]) Topical Santyl Collagenase Ointment, 30 (gm), tube Discharge Instruction: apply nickel thick to wound bed only Primary Dressing Hydrofera Blue Ready Transfer Foam, 2.5x2.5 (in/in) Discharge Instruction: Apply Hydrofera Blue Ready to wound bed as directed Secondary Dressing (BORDER) Zetuvit Plus SILICONE BORDER Dressing 4x4 (in/in) Discharge Instruction: Please do not put silicone bordered dressings under wraps. Use non-bordered dressing only. Secured With Compression Wrap Compression Stockings Add-Ons Electronic Signature(s) Signed: 07/11/2022 11:54:59 AM By: Betha Loa Signed: 07/12/2022 4:19:23 PM By: Yevonne Pax RN Entered By: Betha Loa on 07/11/2022 10:10:23 Brian Dodson ([id number]) -------------------------------------------------------------------------------- Vitals Details Patient Name: Brian Dodson Date of Service: 07/11/2022 9:45 AM Medical Record Number: [id number] Patient Account Number: [id number] Date of Birth/Sex: Apr 22, 1956 (65 y.o. M) Treating RN: Yevonne Pax Primary Care Patti Shorb: Donnel Saxon Other Clinician: Betha Loa Referring Klani Caridi: Donnel Saxon Treating Yalanda Soderman/Extender: Tilda Franco in Treatment: 2 Vital Signs Time Taken: 10:03 Temperature (F): 98.4 Height (in): 75 Pulse (bpm): 46 Weight (lbs): 220 Respiratory Rate (breaths/min): 18 Body Mass Index (BMI): 27.5 Blood Pressure (mmHg): 131/74 Reference Range: 80 - 120 mg / dl Electronic Signature(s) Signed: 07/11/2022 11:54:59 AM By: Betha Loa Entered By: Betha Loa on 07/11/2022 10:03:57

## 2022-07-25 ENCOUNTER — Encounter: Payer: Medicare Other | Attending: Internal Medicine | Admitting: Internal Medicine

## 2022-07-25 DIAGNOSIS — E1151 Type 2 diabetes mellitus with diabetic peripheral angiopathy without gangrene: Secondary | ICD-10-CM | POA: Diagnosis not present

## 2022-07-25 DIAGNOSIS — I4891 Unspecified atrial fibrillation: Secondary | ICD-10-CM | POA: Insufficient documentation

## 2022-07-25 DIAGNOSIS — I2510 Atherosclerotic heart disease of native coronary artery without angina pectoris: Secondary | ICD-10-CM | POA: Diagnosis not present

## 2022-07-25 DIAGNOSIS — I10 Essential (primary) hypertension: Secondary | ICD-10-CM | POA: Diagnosis not present

## 2022-07-25 DIAGNOSIS — F0390 Unspecified dementia without behavioral disturbance: Secondary | ICD-10-CM | POA: Insufficient documentation

## 2022-07-25 DIAGNOSIS — E11622 Type 2 diabetes mellitus with other skin ulcer: Secondary | ICD-10-CM | POA: Insufficient documentation

## 2022-07-25 DIAGNOSIS — S81802A Unspecified open wound, left lower leg, initial encounter: Secondary | ICD-10-CM | POA: Diagnosis not present

## 2022-07-25 DIAGNOSIS — J449 Chronic obstructive pulmonary disease, unspecified: Secondary | ICD-10-CM | POA: Insufficient documentation

## 2022-07-25 DIAGNOSIS — Z7901 Long term (current) use of anticoagulants: Secondary | ICD-10-CM | POA: Insufficient documentation

## 2022-07-26 NOTE — Progress Notes (Signed)
Brian Dodson ([id number]) Visit Report for 07/25/2022 Chief Complaint Document Details Patient Name: Brian Dodson Date of Service: 07/25/2022 9:45 AM Medical Record Number: [id number] Patient Account Number: [id number] Date of Birth/Sex: 05/23/1956 (66 y.o. M) Treating RN: Huel Coventry Primary Care Provider: Donnel Saxon Other Clinician: Betha Loa Referring Provider: Donnel Saxon Treating Provider/Extender: Tilda Franco in Treatment: 4 Information Obtained from: Patient Chief Complaint 06/27/2022; the left knee wound Electronic Signature(s) Signed: 07/25/2022 12:34:47 PM By: Geralyn Corwin DO Entered By: Geralyn Corwin on 07/25/2022 10:34:57 Brian Dodson ([id number]) -------------------------------------------------------------------------------- HPI Details Patient Name: Brian Dodson Date of Service: 07/25/2022 9:45 AM Medical Record Number: [id number] Patient Account Number: [id number] Date of Birth/Sex: [birth year] (66 y.o. M) Treating RN: Huel Coventry Primary Care Provider: Donnel Saxon Other Clinician: Betha Loa Referring Provider: Donnel Saxon Treating Provider/Extender: Tilda Franco in Treatment: 4 History of Present Illness HPI Description: Admission 06/27/2022 Brian Dodson is a 65 year old male with a past medical history of schizophrenia, type 2 diabetes Well-controlled on metformin, dementia on Aricept and A-fib on Eliquis that presents to the clinic for 2-week history of nonhealing wound to his left knee. He resides at [facility]. Tammy, a staff member from the group home is present during the encounter and helps provide the history. The patient visited the ED on 7/27 for increased swelling and pain to the left lower extremity. He had a wound present to the left knee at that time. He was admitted for lower extremity cellulitis. He was started on vancomycin and ceftriaxone and discharged on doxycycline and cefdinir and completed  this course on 8/6. He is unable to tell me exactly how or when the wound started. But he did state he was doing an [employer] crawl And this may have been how the wound started. He has dementia and it is hard to follow his train of thought. He has been using medihoney to the wound bed. He currently denies signs of infection. 8/23; patient presents for follow-up. Home health comes out every other day to change the dressing. At times it appears that he is taking the dressing off between these changes. Dementia and schizophrenia are playing a role with his ability for compliance. We have been using Hydrofera Blue and Santyl. He has no issues or complaints today. He denies signs of infection. 9/6; patient presents for follow-up. He has been using Santyl and Hydrofera Blue. He has a scab over the previous wound site. He has no issues or complaints today. He denies pain or drainage. Electronic Signature(s) Signed: 07/25/2022 12:34:47 PM By: Geralyn Corwin DO Entered By: Geralyn Corwin on 07/25/2022 10:35:34 Brian Dodson ([id number]) -------------------------------------------------------------------------------- Physical Exam Details Patient Name: Brian Dodson Date of Service: 07/25/2022 9:45 AM Medical Record Number: [id number] Patient Account Number: [id number] Date of Birth/Sex: 1956-02-12 (66 y.o. M) Treating RN: Huel Coventry Primary Care Provider: Donnel Saxon Other Clinician: Betha Loa Referring Provider: Donnel Saxon Treating Provider/Extender: Tilda Franco in Treatment: 4 Constitutional . Cardiovascular . Psychiatric . Notes Left lower extremity: To the knee there is epithelization to the previous wound site with a scab in the middle. It appears well-healed. Electronic Signature(s) Signed: 07/25/2022 12:34:47 PM By: Geralyn Corwin DO Entered By: Geralyn Corwin on 07/25/2022 10:35:57 Brian Dodson  ([id number]) -------------------------------------------------------------------------------- Physician Orders Details Patient Name: Brian Dodson Date of Service: 07/25/2022 9:45 AM Medical Record Number: [id number] Patient Account Number: [id number] Date of Birth/Sex: [birth year] (66 y.o. M) Treating RN: Huel Coventry Primary Care  Provider: Donnel Saxon Other Clinician: Betha Loa Referring Provider: Donnel Saxon Treating Provider/Extender: Tilda Franco in Treatment: 4 Verbal / Phone Orders: No Diagnosis Coding Discharge From [facility] Services o Discharge from Wound Care Center Treatment Complete - wound is healed. please call if any further issues arise o Wear compression garments daily. Put garments on first thing when you wake up and remove them before bed. o Moisturize legs daily after removing compression garments. Electronic Signature(s) Signed: 07/25/2022 12:34:47 PM By: Geralyn Corwin DO Entered By: Geralyn Corwin on 07/25/2022 10:40:41 Brian Dodson ([id number]) -------------------------------------------------------------------------------- Problem List Details Patient Name: Brian Dodson Date of Service: 07/25/2022 9:45 AM Medical Record Number: [id number] Patient Account Number: [id number] Date of Birth/Sex: Apr 26, 1956 (66 y.o. M) Treating RN: Huel Coventry Primary Care Provider: Donnel Saxon Other Clinician: Betha Loa Referring Provider: Donnel Saxon Treating Provider/Extender: Tilda Franco in Treatment: 4 Active Problems ICD-10 Encounter Code Description Active Date MDM Diagnosis S81.802A Unspecified open wound, left lower leg, initial encounter 06/27/2022 No Yes E11.622 Type 2 diabetes mellitus with other skin ulcer 06/27/2022 No Yes J44.9 Chronic obstructive pulmonary disease, unspecified 06/27/2022 No Yes I48.91 Unspecified atrial fibrillation 06/27/2022 No Yes Inactive Problems Resolved Problems Electronic Signature(s) Signed: 07/25/2022  12:34:47 PM By: Geralyn Corwin DO Entered By: Geralyn Corwin on 07/25/2022 10:34:34 Brian Dodson ([id number]) -------------------------------------------------------------------------------- Progress Note Details Patient Name: Brian Dodson Date of Service: 07/25/2022 9:45 AM Medical Record Number: [id number] Patient Account Number: [id number] Date of Birth/Sex: 02/23/1956 (66 y.o. M) Treating RN: Huel Coventry Primary Care Provider: Donnel Saxon Other Clinician: Betha Loa Referring Provider: Donnel Saxon Treating Provider/Extender: Tilda Franco in Treatment: 4 Subjective Chief Complaint Information obtained from Patient 06/27/2022; the left knee wound History of Present Illness (HPI) Admission 06/27/2022 Mr. Humberto Addo is a 66 year old male with a past medical history of schizophrenia, type 2 diabetes Well-controlled on metformin, dementia on Aricept and A-fib on Eliquis that presents to the clinic for 2-week history of nonhealing wound to his left knee. He resides at [facility]. Tammy, a staff member from the group home is present during the encounter and helps provide the history. The patient visited the ED on 7/27 for increased swelling and pain to the left lower extremity. He had a wound present to the left knee at that time. He was admitted for lower extremity cellulitis. He was started on vancomycin and ceftriaxone and discharged on doxycycline and cefdinir and completed this course on 8/6. He is unable to tell me exactly how or when the wound started. But he did state he was doing an [employer] crawl And this may have been how the wound started. He has dementia and it is hard to follow his train of thought. He has been using medihoney to the wound bed. He currently denies signs of infection. 8/23; patient presents for follow-up. Home health comes out every other day to change the dressing. At times it appears that he is taking the dressing off between these  changes. Dementia and schizophrenia are playing a role with his ability for compliance. We have been using Hydrofera Blue and Santyl. He has no issues or complaints today. He denies signs of infection. 9/6; patient presents for follow-up. He has been using Santyl and Hydrofera Blue. He has a scab over the previous wound site. He has no issues or complaints today. He denies pain or drainage. Objective Constitutional Vitals Time Taken: 10:05 AM, Height: 75 in, Weight: 220 lbs, BMI: 27.5, Temperature: 98.3 F, Pulse:  73 bpm, Respiratory Rate: 16 breaths/min, Blood Pressure: 111/74 mmHg. General Notes: Left lower extremity: To the knee there is epithelization to the previous wound site with a scab in the middle. It appears well- healed. Integumentary (Hair, Skin) Wound #1 status is Healed - Epithelialized. Original cause of wound was Gradually Appeared. The date acquired was: 06/14/2022. The wound has been in treatment 4 weeks. The wound is located on the Left Knee. The wound measures 0cm length x 0cm width x 0cm depth; 0cm^2 area and 0cm^3 volume. There is Fat Layer (Subcutaneous Tissue) exposed. There is a none present amount of drainage noted. There is no granulation within the wound bed. There is no necrotic tissue within the wound bed. Assessment Active Problems ICD-10 Unspecified open wound, left lower leg, initial encounter Type 2 diabetes mellitus with other skin ulcer Chronic obstructive pulmonary disease, unspecified Unspecified atrial fibrillation ROSALIE, GELPI. ([id number]) Patient has done well Santyl and Hydrofera Blue. His wound is healed. He may follow-up as needed. Plan Discharge From [facility] Services: Discharge from Wound Care Center Treatment Complete - wound is healed. please call if any further issues arise Wear compression garments daily. Put garments on first thing when you wake up and remove them before bed. Moisturize legs daily after removing compression garments. 1.  Discharge from clinic due to closed wound 2. Follow-up as needed Electronic Signature(s) Signed: 07/25/2022 12:34:47 PM By: Geralyn Corwin DO Entered By: Geralyn Corwin on 07/25/2022 10:40:11 Brian Dodson ([id number]) -------------------------------------------------------------------------------- ROS/PFSH Details Patient Name: Brian Dodson Date of Service: 07/25/2022 9:45 AM Medical Record Number: [id number] Patient Account Number: [id number] Date of Birth/Sex: [birth year] (66 y.o. M) Treating RN: Huel Coventry Primary Care Provider: Donnel Saxon Other Clinician: Betha Loa Referring Provider: Donnel Saxon Treating Provider/Extender: Tilda Franco in Treatment: 4 Hematologic/Lymphatic Medical History: Positive for: Anemia Respiratory Medical History: Positive for: Chronic Obstructive Pulmonary Disease (COPD) Cardiovascular Medical History: Positive for: Arrhythmia - a fib; Coronary Artery Disease; Hypertension Endocrine Medical History: Positive for: Type II Diabetes Treated with: Oral agents Blood sugar tested every day: No Integumentary (Skin) Medical History: Positive for: History of pressure wounds Neurologic Medical History: Positive for: Dementia - vascular Immunizations Pneumococcal Vaccine: Received Pneumococcal Vaccination: Yes Received Pneumococcal Vaccination On or After 60th Birthday: Yes Implantable Devices None Family and Social History Former smoker; Alcohol Use: Never; Drug Use: No History; Caffeine Use: Daily - coffee, soda Electronic Signature(s) Signed: 07/25/2022 12:34:47 PM By: Geralyn Corwin DO Signed: 07/26/2022 9:40:40 AM By: Elliot Gurney, BSN, RN, CWS, Kim RN, BSN Entered By: Geralyn Corwin on 07/25/2022 10:41:12 Brian Dodson ([id number]) -------------------------------------------------------------------------------- SuperBill Details Patient Name: Brian Dodson Date of Service: 07/25/2022 Medical Record Number:  [id number] Patient Account Number: [id number] Date of Birth/Sex: September 28, 1956 (66 y.o. M) Treating RN: Huel Coventry Primary Care Provider: Donnel Saxon Other Clinician: Betha Loa Referring Provider: Donnel Saxon Treating Provider/Extender: Tilda Franco in Treatment: 4 Diagnosis Coding ICD-10 Codes Code Description [phone] Unspecified open wound, left lower leg, initial encounter E11.622 Type 2 diabetes mellitus with other skin ulcer J44.9 Chronic obstructive pulmonary disease, unspecified I48.91 Unspecified atrial fibrillation Facility Procedures CPT4 Code: [id number] Description: [phone] - WOUND CARE VISIT-LEV 2 EST PT Modifier: Quantity: 1 Physician Procedures CPT4 Code: 8676195 Description: 99213 - WC PHYS LEVEL 3 - EST PT Modifier: Quantity: 1 CPT4 Code: Description: ICD-10 Diagnosis Description S81.802A Unspecified open wound, left lower leg, initial encounter E11.622 Type 2 diabetes mellitus with other skin ulcer J44.9 Chronic obstructive pulmonary disease, unspecified I48.91 Unspecified atrial  fibrillation Modifier: Quantity: Electronic Signature(s) Signed: 07/25/2022 12:34:47 PM By: Geralyn Corwin DO Entered By: Geralyn Corwin on 07/25/2022 10:40:31

## 2022-07-26 NOTE — Progress Notes (Signed)
Brian Dodson, Brian Dodson ([id number]) Visit Report for 07/25/2022 Arrival Information Details Patient Name: Brian Dodson, Brian Dodson Date of Service: 07/25/2022 9:45 AM Medical Record Number: [id number] Patient Account Number: [id number] Date of Birth/Sex: [birth year] (65 y.o. M) Treating RN: Huel Coventry Primary Care Saburo Luger: Donnel Saxon Other Clinician: Betha Loa Referring Teliah Buffalo: Donnel Saxon Treating Martie Muhlbauer/Extender: Tilda Franco in Treatment: 4 Visit Information History Since Last Visit All ordered tests and consults were completed: No Patient Arrived: Ambulatory Added or deleted any medications: No Arrival Time: 10:04 Any new allergies or adverse reactions: No Transfer Assistance: None Had a fall or experienced change in No Patient Has Alerts: Yes activities of daily living that may affect Patient Alerts: Patient on Blood Thinner risk of falls: Hospitalized since last visit: No Pain Present Now: No Electronic Signature(s) Signed: 07/25/2022 5:12:58 PM By: Betha Loa Entered By: Betha Loa on 07/25/2022 10:05:26 Brian Dodson ([id number]) -------------------------------------------------------------------------------- Clinic Level of Care Assessment Details Patient Name: Brian Dodson Date of Service: 07/25/2022 9:45 AM Medical Record Number: [id number] Patient Account Number: [id number] Date of Birth/Sex: [birth year] (65 y.o. M) Treating RN: Huel Coventry Primary Care Ariston Grandison: Donnel Saxon Other Clinician: Betha Loa Referring Sendy Pluta: Donnel Saxon Treating Rogen Porte/Extender: Tilda Franco in Treatment: 4 Clinic Level of Care Assessment Items TOOL 4 Quantity Score []  - Use when only an EandM is performed on FOLLOW-UP visit 0 ASSESSMENTS - Nursing Assessment / Reassessment X - Reassessment of Co-morbidities (includes updates in patient status) 1 10 X- 1 5 Reassessment of Adherence to Treatment Plan ASSESSMENTS - Wound and Skin Assessment /  Reassessment X - Simple Wound Assessment / Reassessment - one wound 1 5 []  - 0 Complex Wound Assessment / Reassessment - multiple wounds []  - 0 Dermatologic / Skin Assessment (not related to wound area) ASSESSMENTS - Focused Assessment []  - Circumferential Edema Measurements - multi extremities 0 []  - 0 Nutritional Assessment / Counseling / Intervention []  - 0 Lower Extremity Assessment (monofilament, tuning fork, pulses) []  - 0 Peripheral Arterial Disease Assessment (using hand held doppler) ASSESSMENTS - Ostomy and/or Continence Assessment and Care []  - Incontinence Assessment and Management 0 []  - 0 Ostomy Care Assessment and Management (repouching, etc.) PROCESS - Coordination of Care X - Simple Patient / Family Education for ongoing care 1 15 []  - 0 Complex (extensive) Patient / Family Education for ongoing care []  - 0 Staff obtains , Records, Test Results / Process Orders []  - 0 Staff telephones HHA, Nursing Homes / Clarify orders / etc []  - 0 Routine Transfer to another Facility (non-emergent condition) []  - 0 Routine Hospital Admission (non-emergent condition) []  - 0 New Admissions / / Ordering NPWT, Apligraf, etc. []  - 0 Emergency Hospital Admission (emergent condition) X- 1 10 Simple Discharge Coordination []  - 0 Complex (extensive) Discharge Coordination PROCESS - Special Needs []  - Pediatric / Minor Patient Management 0 []  - 0 Isolation Patient Management []  - 0 Hearing / Language / Visual special needs []  - 0 Assessment of Community assistance (transportation, D/C planning, etc.) []  - 0 Additional assistance / Altered mentation []  - 0 Support Surface(s) Assessment (bed, cushion, seat, etc.) INTERVENTIONS - Wound Cleansing / Measurement Brian Dodson, Brian H. ( ) X- 1 5 Simple Wound Cleansing - one wound []  - 0 Complex Wound Cleansing - multiple wounds X- 1 5 Wound Imaging (photographs - any number of wounds) []  -  0 Wound Tracing (instead of photographs) []  - 0 Simple Wound Measurement - one wound []  - 0 Complex Wound  Measurement - multiple wounds INTERVENTIONS - Wound Dressings []  - Small Wound Dressing one or multiple wounds 0 []  - 0 Medium Wound Dressing one or multiple wounds []  - 0 Large Wound Dressing one or multiple wounds []  - 0 Application of Medications - topical []  - 0 Application of Medications - injection INTERVENTIONS - Miscellaneous []  - External ear exam 0 []  - 0 Specimen Collection (cultures, biopsies, blood, body fluids, etc.) []  - 0 Specimen(s) / Culture(s) sent or taken to Lab for analysis []  - 0 Patient Transfer (multiple staff / / Similar devices) []  - 0 Simple Staple / Suture removal (25 or less) []  - 0 Complex Staple / Suture removal (26 or more) []  - 0 Hypo / Hyperglycemic Management (close monitor of Blood Glucose) []  - 0 Ankle / Brachial Index (ABI) - do not check if billed separately X- 1 5 Vital Signs Has the patient been seen at the hospital within the last three years: Yes Total Score: 60 Level Of Care: New/Established - Level 2 Electronic Signature(s) Signed: 07/25/2022 5:12:58 PM By: Entered By: on 07/25/2022 10:21:26 ( ) -------------------------------------------------------------------------------- Encounter Discharge Information Details Patient Name: Date of Service: 07/25/2022 9:45 AM Medical Record Number: Nurse, adult Patient Account Number: Date of Birth/Sex: February 19, 1956 (65 y.o. M) Treating RN: Primary Care Makiyla Linch: 09/24/2022 Other Clinician: Betha Loa Referring Janele Lague: Betha Loa Treating Reata Petrov/Extender: 09/24/2022 in Treatment: 4 Encounter Discharge Information Items Discharge Condition: Stable Ambulatory Status: Ambulatory Discharge Destination: Other (Note Required) Telephoned: Yes Orders Sent:  Yes Transportation: Other Accompanied By: Resident care cooridnator Schedule Follow-up Appointment: Yes Clinical Summary of Care: Electronic Signature(s) Signed: 07/25/2022 5:12:58 PM By: Brian Dodson Entered By: Brian Dodson on 07/25/2022 10:23:44 Brian Dodson ([id number]) -------------------------------------------------------------------------------- Lower Extremity Assessment Details Patient Name: Brian Dodson Date of Service: 07/25/2022 9:45 AM Medical Record Number: Huel Coventry Patient Account Number: Donnel Saxon Date of Birth/Sex: 12/05/1955 (65 y.o. M) Treating RN: Tilda Franco Primary Care Placido Hangartner: 09/24/2022 Other Clinician: Betha Loa Referring Landi Biscardi: Betha Loa Treating Reakwon Barren/Extender: 09/24/2022 in Treatment: 4 Edema Assessment Assessed: [Left: No] [Right: No] [Left: Edema] [Right: :] Calf Left: Right: Point of Measurement: 40 cm From Medial Instep Ankle Left: Right: Point of Measurement: 11 cm From Medial Instep Electronic Signature(s) Signed: 07/25/2022 5:12:58 PM By: [id number] Signed: 07/26/2022 9:40:40 AM By: 09/24/2022, BSN, RN, CWS, Kim RN, BSN Entered By: [id number] on 07/25/2022 10:12:00 Brian Dodson ([id number]) -------------------------------------------------------------------------------- Multi Wound Chart Details Patient Name: Huel Coventry Date of Service: 07/25/2022 9:45 AM Medical Record Number: Betha Loa Patient Account Number: Donnel Saxon Date of Birth/Sex: 07-01-1956 (65 y.o. M) Treating RN: Betha Loa Primary Care Jonhatan Hearty: 09/25/2022 Other Clinician: Elliot Gurney Referring Deziyah Arvin: Betha Loa Treating Senetra Dillin/Extender: 09/24/2022 in Treatment: 4 Vital Signs Height(in): 75 Pulse(bpm): 73 Weight(lbs): 220 Blood Pressure(mmHg): 111/74 Body Mass Index(BMI): 27.5 Temperature(F): 98.3 Respiratory Rate(breaths/min): 16 Photos: [N/A:N/A] Wound Location: Left Knee N/A N/A Wounding  Event: Gradually Appeared N/A N/A Primary Etiology: Cellulitis N/A N/A Comorbid History: Anemia, Chronic Obstructive N/A N/A Pulmonary Disease (COPD), Arrhythmia, Coronary Artery Disease, Hypertension, Type II Diabetes, History of pressure wounds, Dementia Date Acquired: 06/14/2022 N/A N/A Weeks of Treatment: 4 N/A N/A Wound Status: Open N/A N/A Wound Recurrence: No N/A N/A Measurements L x W x D (cm) 0.1x0.1x0.1 N/A N/A Area (cm) : 0.008 N/A N/A Volume (cm) : 0.001 N/A N/A % Reduction in Area: 99.30% N/A N/A %  Reduction in Volume: 99.10% N/A N/A Classification: Full Thickness Without Exposed N/A N/A Support Structures Exudate Amount: None Present N/A N/A Granulation Amount: None Present (0%) N/A N/A Necrotic Amount: None Present (0%) N/A N/A Exposed Structures: Fat Layer (Subcutaneous Tissue): N/A N/A Yes Fascia: No Tendon: No Muscle: No Joint: No Bone: No Epithelialization: Large (67-100%) N/A N/A Treatment Notes Electronic Signature(s) Signed: 07/25/2022 5:12:58 PM By: Betha Loa Entered By: Betha Loa on 07/25/2022 10:12:14 Brian Dodson ([id number]) -------------------------------------------------------------------------------- Multi-Disciplinary Care Plan Details Patient Name: Brian Dodson Date of Service: 07/25/2022 9:45 AM Medical Record Number: [id number] Patient Account Number: [id number] Date of Birth/Sex: 1956-08-21 (65 y.o. M) Treating RN: Huel Coventry Primary Care Natayla Cadenhead: Donnel Saxon Other Clinician: Betha Loa Referring Lai Hendriks: Donnel Saxon Treating Bristyl Mclees/Extender: Tilda Franco in Treatment: 4 Active Inactive Electronic Signature(s) Signed: 07/25/2022 5:12:58 PM By: Betha Loa Signed: 07/26/2022 9:40:40 AM By: Elliot Gurney, BSN, RN, CWS, Kim RN, BSN Entered By: Betha Loa on 07/25/2022 10:21:54 Brian Dodson ([id number]) -------------------------------------------------------------------------------- Pain Assessment  Details Patient Name: Brian Dodson Date of Service: 07/25/2022 9:45 AM Medical Record Number: [id number] Patient Account Number: [id number] Date of Birth/Sex: 1956-07-27 (65 y.o. M) Treating RN: Huel Coventry Primary Care Madysun Thall: Donnel Saxon Other Clinician: Betha Loa Referring Amogh Komatsu: Donnel Saxon Treating Shannah Conteh/Extender: Tilda Franco in Treatment: 4 Active Problems Location of Pain Severity and Description of Pain Patient Has Paino No Site Locations Pain Management and Medication Current Pain Management: Electronic Signature(s) Signed: 07/25/2022 5:12:58 PM By: Betha Loa Signed: 07/26/2022 9:40:40 AM By: Elliot Gurney, BSN, RN, CWS, Kim RN, BSN Entered By: Betha Loa on 07/25/2022 10:07:34 Brian Dodson ([id number]) -------------------------------------------------------------------------------- Patient/Caregiver Education Details Patient Name: Brian Dodson Date of Service: 07/25/2022 9:45 AM Medical Record Number: [id number] Patient Account Number: [id number] Date of Birth/Gender: 14-May-1956 (65 y.o. M) Treating RN: Huel Coventry Primary Care Physician: Donnel Saxon Other Clinician: Betha Loa Referring Physician: Donnel Saxon Treating Physician/Extender: Tilda Franco in Treatment: 4 Education Assessment Education Provided To: Caregiver Education Topics Provided Wound/Skin Impairment: Handouts: Other: wound healed. Please call if any further issues arise Methods: Explain/Verbal Responses: State content correctly Electronic Signature(s) Signed: 07/25/2022 5:12:58 PM By: Betha Loa Entered By: Betha Loa on 07/25/2022 10:22:36 Brian Dodson ([id number]) -------------------------------------------------------------------------------- Wound Assessment Details Patient Name: Brian Dodson Date of Service: 07/25/2022 9:45 AM Medical Record Number: [id number] Patient Account Number: [id number] Date of Birth/Sex: 02/08/1956 (65 y.o.  M) Treating RN: Huel Coventry Primary Care Jack Mineau: Donnel Saxon Other Clinician: Betha Loa Referring Philander Ake: Donnel Saxon Treating Sayuri Rhames/Extender: Tilda Franco in Treatment: 4 Wound Status Wound Number: 1 Primary Cellulitis Etiology: Wound Location: Left Knee Wound Healed - Epithelialized Wounding Event: Gradually Appeared Status: Date Acquired: 06/14/2022 Comorbid Anemia, Chronic Obstructive Pulmonary Disease (COPD), Weeks Of Treatment: 4 History: Arrhythmia, Coronary Artery Disease, Hypertension, Type Clustered Wound: No II Diabetes, History of pressure wounds, Dementia Photos Wound Measurements Length: (cm) 0 Width: (cm) 0 Depth: (cm) 0 Area: (cm) 0 Volume: (cm) 0 % Reduction in Area: 100% % Reduction in Volume: 100% Epithelialization: Large (67-100%) Wound Description Classification: Full Thickness Without Exposed Support Structure Exudate Amount: None Present s Foul Odor After Cleansing: No Slough/Fibrino No Wound Bed Granulation Amount: None Present (0%) Exposed Structure Necrotic Amount: None Present (0%) Fascia Exposed: No Fat Layer (Subcutaneous Tissue) Exposed: Yes Tendon Exposed: No Muscle Exposed: No Joint Exposed: No Bone Exposed: No Treatment Notes Wound #1 (Knee) Wound Laterality: Left Cleanser Peri-Wound Care Topical Primary Dressing JAKARIUS, FLAMENCO ([id number]) Secondary Dressing  Secured With Compression Wrap Compression Stockings Facilities manager) Signed: 07/25/2022 5:12:58 PM By: Betha Loa Signed: 07/26/2022 9:40:40 AM By: Elliot Gurney, BSN, RN, CWS, Kim RN, BSN Entered By: Betha Loa on 07/25/2022 10:19:35 Brian Dodson ([id number]) -------------------------------------------------------------------------------- Vitals Details Patient Name: Brian Dodson Date of Service: 07/25/2022 9:45 AM Medical Record Number: [id number] Patient Account Number: [id number] Date of Birth/Sex: June 13, 1956 (65 y.o.  M) Treating RN: Huel Coventry Primary Care Tyce Delcid: Donnel Saxon Other Clinician: Betha Loa Referring Aneliz Carbary: Donnel Saxon Treating Talen Poser/Extender: Tilda Franco in Treatment: 4 Vital Signs Time Taken: 10:05 Temperature (F): 98.3 Height (in): 75 Pulse (bpm): 73 Weight (lbs): 220 Respiratory Rate (breaths/min): 16 Body Mass Index (BMI): 27.5 Blood Pressure (mmHg): 111/74 Reference Range: 80 - 120 mg / dl Electronic Signature(s) Signed: 07/25/2022 5:12:58 PM By: Betha Loa Entered By: Betha Loa on 07/25/2022 10:07:29

## 2022-12-17 ENCOUNTER — Emergency Department: Payer: Medicare Other

## 2022-12-17 ENCOUNTER — Other Ambulatory Visit: Payer: Self-pay

## 2022-12-17 ENCOUNTER — Inpatient Hospital Stay
Admission: EM | Admit: 2022-12-17 | Discharge: 2022-12-20 | DRG: 871 | Disposition: A | Discharge status: Skilled Nursing Facility | Payer: Medicare Other | Source: Skilled Nursing Facility | Attending: Internal Medicine | Admitting: Internal Medicine

## 2022-12-17 DIAGNOSIS — I129 Hypertensive chronic kidney disease with stage 1 through stage 4 chronic kidney disease, or unspecified chronic kidney disease: Secondary | ICD-10-CM | POA: Diagnosis present

## 2022-12-17 DIAGNOSIS — I10 Essential (primary) hypertension: Secondary | ICD-10-CM | POA: Diagnosis present

## 2022-12-17 DIAGNOSIS — N401 Enlarged prostate with lower urinary tract symptoms: Secondary | ICD-10-CM | POA: Diagnosis present

## 2022-12-17 DIAGNOSIS — Z7901 Long term (current) use of anticoagulants: Secondary | ICD-10-CM

## 2022-12-17 DIAGNOSIS — Z888 Allergy status to other drugs, medicaments and biological substances status: Secondary | ICD-10-CM

## 2022-12-17 DIAGNOSIS — K219 Gastro-esophageal reflux disease without esophagitis: Secondary | ICD-10-CM | POA: Diagnosis present

## 2022-12-17 DIAGNOSIS — Z7982 Long term (current) use of aspirin: Secondary | ICD-10-CM

## 2022-12-17 DIAGNOSIS — I480 Paroxysmal atrial fibrillation: Secondary | ICD-10-CM | POA: Diagnosis present

## 2022-12-17 DIAGNOSIS — F209 Schizophrenia, unspecified: Secondary | ICD-10-CM | POA: Diagnosis present

## 2022-12-17 DIAGNOSIS — E1122 Type 2 diabetes mellitus with diabetic chronic kidney disease: Secondary | ICD-10-CM | POA: Diagnosis present

## 2022-12-17 DIAGNOSIS — Z9841 Cataract extraction status, right eye: Secondary | ICD-10-CM

## 2022-12-17 DIAGNOSIS — M545 Low back pain, unspecified: Secondary | ICD-10-CM | POA: Diagnosis present

## 2022-12-17 DIAGNOSIS — R6520 Severe sepsis without septic shock: Secondary | ICD-10-CM | POA: Diagnosis present

## 2022-12-17 DIAGNOSIS — U071 COVID-19: Secondary | ICD-10-CM | POA: Diagnosis present

## 2022-12-17 DIAGNOSIS — E871 Hypo-osmolality and hyponatremia: Secondary | ICD-10-CM | POA: Diagnosis present

## 2022-12-17 DIAGNOSIS — E785 Hyperlipidemia, unspecified: Secondary | ICD-10-CM | POA: Diagnosis present

## 2022-12-17 DIAGNOSIS — N179 Acute kidney failure, unspecified: Secondary | ICD-10-CM | POA: Diagnosis present

## 2022-12-17 DIAGNOSIS — Z79899 Other long term (current) drug therapy: Secondary | ICD-10-CM

## 2022-12-17 DIAGNOSIS — I672 Cerebral atherosclerosis: Secondary | ICD-10-CM | POA: Diagnosis present

## 2022-12-17 DIAGNOSIS — I2510 Atherosclerotic heart disease of native coronary artery without angina pectoris: Secondary | ICD-10-CM | POA: Diagnosis present

## 2022-12-17 DIAGNOSIS — E860 Dehydration: Secondary | ICD-10-CM | POA: Diagnosis present

## 2022-12-17 DIAGNOSIS — Z87891 Personal history of nicotine dependence: Secondary | ICD-10-CM

## 2022-12-17 DIAGNOSIS — Z955 Presence of coronary angioplasty implant and graft: Secondary | ICD-10-CM

## 2022-12-17 DIAGNOSIS — J4489 Other specified chronic obstructive pulmonary disease: Secondary | ICD-10-CM | POA: Diagnosis present

## 2022-12-17 DIAGNOSIS — F0153 Vascular dementia, unspecified severity, with mood disturbance: Secondary | ICD-10-CM | POA: Insufficient documentation

## 2022-12-17 DIAGNOSIS — Z7984 Long term (current) use of oral hypoglycemic drugs: Secondary | ICD-10-CM

## 2022-12-17 DIAGNOSIS — A4150 Gram-negative sepsis, unspecified: Secondary | ICD-10-CM

## 2022-12-17 DIAGNOSIS — Z833 Family history of diabetes mellitus: Secondary | ICD-10-CM

## 2022-12-17 DIAGNOSIS — Z9842 Cataract extraction status, left eye: Secondary | ICD-10-CM

## 2022-12-17 DIAGNOSIS — Z961 Presence of intraocular lens: Secondary | ICD-10-CM | POA: Diagnosis present

## 2022-12-17 DIAGNOSIS — N189 Chronic kidney disease, unspecified: Secondary | ICD-10-CM | POA: Diagnosis present

## 2022-12-17 DIAGNOSIS — N390 Urinary tract infection, site not specified: Secondary | ICD-10-CM

## 2022-12-17 DIAGNOSIS — Z882 Allergy status to sulfonamides status: Secondary | ICD-10-CM

## 2022-12-17 DIAGNOSIS — F32A Depression, unspecified: Secondary | ICD-10-CM | POA: Diagnosis present

## 2022-12-17 DIAGNOSIS — E119 Type 2 diabetes mellitus without complications: Secondary | ICD-10-CM

## 2022-12-17 LAB — COMPREHENSIVE METABOLIC PANEL
ALT: 11 U/L (ref 0–44)
AST: 25 U/L (ref 15–41)
Albumin: 3.4 g/dL — ABNORMAL LOW (ref 3.5–5.0)
Alkaline Phosphatase: 49 U/L (ref 38–126)
Anion gap: 9 (ref 5–15)
BUN: 33 mg/dL — ABNORMAL HIGH (ref 8–23)
CO2: 23 mmol/L (ref 22–32)
Calcium: 8.5 mg/dL — ABNORMAL LOW (ref 8.9–10.3)
Chloride: 101 mmol/L (ref 98–111)
Creatinine, Ser: 1.57 mg/dL — ABNORMAL HIGH (ref 0.61–1.24)
GFR, Estimated: 48 mL/min — ABNORMAL LOW (ref >60)
Glucose, Bld: 139 mg/dL — ABNORMAL HIGH (ref 70–99)
Potassium: 4.2 mmol/L (ref 3.5–5.1)
Sodium: 133 mmol/L — ABNORMAL LOW (ref 135–145)
Total Bilirubin: 0.8 mg/dL (ref 0.3–1.2)
Total Protein: 7.3 g/dL (ref 6.5–8.1)

## 2022-12-17 LAB — CBC WITH DIFFERENTIAL/PLATELET
Abs Immature Granulocytes: 0.1 10*3/uL — ABNORMAL HIGH (ref 0.00–0.07)
Basophils Absolute: 0 10*3/uL (ref 0.0–0.1)
Basophils Relative: 0 %
Eosinophils Absolute: 0 10*3/uL (ref 0.0–0.5)
Eosinophils Relative: 0 %
HCT: 33.4 % — ABNORMAL LOW (ref 39.0–52.0)
Hemoglobin: 10.8 g/dL — ABNORMAL LOW (ref 13.0–17.0)
Immature Granulocytes: 1 %
Lymphocytes Relative: 15 %
Lymphs Abs: 2.1 10*3/uL (ref 0.7–4.0)
MCH: 32.3 pg (ref 26.0–34.0)
MCHC: 32.3 g/dL (ref 30.0–36.0)
MCV: 100 fL (ref 80.0–100.0)
Monocytes Absolute: 1.6 10*3/uL — ABNORMAL HIGH (ref 0.1–1.0)
Monocytes Relative: 12 %
Neutro Abs: 9.7 10*3/uL — ABNORMAL HIGH (ref 1.7–7.7)
Neutrophils Relative %: 72 %
Platelets: 166 10*3/uL (ref 150–400)
RBC: 3.34 MIL/uL — ABNORMAL LOW (ref 4.22–5.81)
RDW: 13.6 % (ref 11.5–15.5)
WBC: 13.4 10*3/uL — ABNORMAL HIGH (ref 4.0–10.5)
nRBC: 0 % (ref 0.0–0.2)

## 2022-12-17 LAB — URINALYSIS, ROUTINE W REFLEX MICROSCOPIC
Bilirubin Urine: NEGATIVE
Glucose, UA: NEGATIVE mg/dL
Hgb urine dipstick: NEGATIVE
Ketones, ur: 5 mg/dL — AB
Nitrite: NEGATIVE
Protein, ur: 30 mg/dL — AB
Specific Gravity, Urine: 1.026 (ref 1.005–1.030)
pH: 6 (ref 5.0–8.0)

## 2022-12-17 LAB — TROPONIN I (HIGH SENSITIVITY): Troponin I (High Sensitivity): 11 ng/L (ref <18)

## 2022-12-17 LAB — PROCALCITONIN: Procalcitonin: 0.35 ng/mL

## 2022-12-17 LAB — LACTIC ACID, PLASMA: Lactic Acid, Venous: 2.4 mmol/L (ref 0.5–1.9)

## 2022-12-17 MED ORDER — ACETAMINOPHEN 500 MG PO TABS
1000.0000 mg | ORAL_TABLET | Freq: Once | ORAL | Status: AC
  Administered 2022-12-18: 1000 mg via ORAL
  Filled 2022-12-17: qty 2

## 2022-12-17 NOTE — ED Provider Notes (Signed)
[facility] Provider Note    Event Date/Time   First MD Initiated Contact with Patient 12/17/22 2301     (approximate)   History   Back Pain   HPI  Brian Dodson is a 67 y.o. male who presents to the ED for evaluation of Back Pain   I review 8/22 cardiology clinic visit. Hx CAD, pAF, HTN, HLD, DM. CKD, schizophrenia and vascular dementia. Amio and eliquis.   He presents to the ED via EMS from his local SNF for evaluation.  He was triaged as having back pain, but he is disoriented and does not tell this to me.  He reports that he feels fine and has no complaints when I see him.  When asked about back pain he reports that he "sometimes" gets back pain but not right now.  History somewhat limited due to disorientation  Physical Exam   Triage Vital Signs: ED Triage Vitals [12/17/22 2254]  Enc Vitals Group     BP 102/79     Pulse Rate (!) 132     Resp 18     Temp (!) 100.8 F (38.2 C)     Temp Source Oral     SpO2 94 %     Weight      Height      Head Circumference      Peak Flow      Pain Score      Pain Loc      Pain Edu?      Excl. in [place]?     Most recent vital signs: Vitals:   12/18/22 0600 12/18/22 0601  BP: (!) 88/66   Pulse: 63   Resp: 12   Temp:  98.4 F (36.9 C)  SpO2: 100%     General: Awake, no distress.  Pleasant and conversational.  Dry mucous membranes.  Follows commands in all 4 extremities.  Sits upright with minimal assistance so I can examine his back and held onto the handrails without difficulty.  Nontender back and no signs of skin changes or trauma to the back.  Nontender spine without step-offs.  No CVA tenderness bilaterally. CV:  Good peripheral perfusion.  Tachycardic and irregular Resp:  Normal effort.  Abd:  No distention.  Soft and benign. MSK:  No deformity noted.  Neuro:  No focal deficits appreciated. Other:     ED Results / Procedures / Treatments   Labs (all labs ordered are listed, but only  abnormal results are displayed) Labs Reviewed  RESP PANEL BY RT-PCR (RSV, FLU A&B, COVID)  RVPGX2 - Abnormal; Notable for the following components:      Result Value   SARS Coronavirus 2 by RT PCR POSITIVE (*)    All other components within normal limits  CBC WITH DIFFERENTIAL/PLATELET - Abnormal; Notable for the following components:   WBC 13.4 (*)    RBC 3.34 (*)    Hemoglobin 10.8 (*)    HCT 33.4 (*)    Neutro Abs 9.7 (*)    Monocytes Absolute 1.6 (*)    Abs Immature Granulocytes 0.10 (*)    All other components within normal limits  COMPREHENSIVE METABOLIC PANEL - Abnormal; Notable for the following components:   Sodium 133 (*)    Glucose, Bld 139 (*)    BUN 33 (*)    Creatinine, Ser 1.57 (*)    Calcium 8.5 (*)    Albumin 3.4 (*)    GFR, Estimated 48 (*)  All other components within normal limits  LACTIC ACID, PLASMA - Abnormal; Notable for the following components:   Lactic Acid, Venous 2.4 (*)    All other components within normal limits  LACTIC ACID, PLASMA - Abnormal; Notable for the following components:   Lactic Acid, Venous 2.0 (*)    All other components within normal limits  URINALYSIS, ROUTINE W REFLEX MICROSCOPIC - Abnormal; Notable for the following components:   Color, Urine YELLOW (*)    APPearance HAZY (*)    Ketones, ur 5 (*)    Protein, ur 30 (*)    Leukocytes,Ua TRACE (*)    Bacteria, UA MANY (*)    All other components within normal limits  PROTIME-INR - Abnormal; Notable for the following components:   Prothrombin Time 16.9 (*)    INR 1.4 (*)    All other components within normal limits  APTT - Abnormal; Notable for the following components:   aPTT 44 (*)    All other components within normal limits  CULTURE, BLOOD (SINGLE)  CULTURE, BLOOD (SINGLE)  URINE CULTURE  PROCALCITONIN  PROCALCITONIN  LACTIC ACID, PLASMA  LACTIC ACID, PLASMA  TROPONIN I (HIGH SENSITIVITY)  TROPONIN I (HIGH SENSITIVITY)    EKG Afib, rate of 139 bpm, normal  axis. Qtc 490. No high grade interval block. No STEMI  RADIOLOGY CXR interpreted by me without evidence of acute cardiopulmonary pathology.  Official radiology report(s): CT HEAD WO CONTRAST ( )  Result Date: 12/18/2022 CLINICAL DATA:  Altered mental status EXAM: CT HEAD WITHOUT CONTRAST TECHNIQUE: Contiguous axial images were obtained from the base of the skull through the vertex without intravenous contrast. RADIATION DOSE REDUCTION: This exam was performed according to the departmental dose-optimization program which includes automated exposure control, adjustment of the mA and/or kV according to patient size and/or use of iterative reconstruction technique. COMPARISON:  09/17/2021 FINDINGS: Brain: No evidence of acute infarction, hemorrhage, hydrocephalus, extra-axial collection or mass lesion/mass effect. Chronic white matter ischemic changes are again noted and stable. Basal ganglia calcifications are seen. Vascular: No hyperdense vessel or unexpected calcification. Skull: Normal. Negative for fracture or focal lesion. Sinuses/Orbits: No acute finding. Other: None. IMPRESSION: Chronic ischemic changes without acute abnormality. Electronically Signed   By: Alcide Clever M.D.   On: 12/18/2022 00:25   DG Chest Portable 1 View  Result Date: 12/17/2022 CLINICAL DATA:  Back pain. EXAM: PORTABLE CHEST 1 VIEW COMPARISON:  Chest radiograph 09/23/2021. FINDINGS: Mild cardiomegaly is stable.The mediastinal contours are normal. The lungs are clear. Pulmonary vasculature is normal. No consolidation, pleural effusion, or pneumothorax. No acute osseous abnormalities are seen. IMPRESSION: Stable mild cardiomegaly. No acute chest findings. Electronically Signed   By: Narda Rutherford M.D.   On: 12/17/2022 23:32    PROCEDURES and INTERVENTIONS:  .Critical Care  Performed by: Delton Prairie, MD Authorized by: Delton Prairie, MD   Critical care provider statement:    Critical care time (minutes):  30   Critical  care time was exclusive of:  Separately billable procedures and treating other patients   Critical care was necessary to treat or prevent imminent or life-threatening deterioration of the following conditions:  Sepsis   Critical care was time spent personally by me on the following activities:  Development of treatment plan with patient or surrogate, discussions with consultants, evaluation of patient's response to treatment, examination of patient, ordering and review of laboratory studies, ordering and review of radiographic studies, ordering and performing treatments and interventions, pulse oximetry, re-evaluation of patient's condition and  review of old charts .1-3 Lead EKG Interpretation  Performed by: Vladimir Crofts, MD Authorized by: Vladimir Crofts, MD     Interpretation: normal     ECG rate:  64   ECG rate assessment: normal     Rhythm: sinus rhythm     Ectopy: none     Conduction: normal     Medications  aspirin EC tablet 81 mg (has no administration in time range)  furosemide (LASIX) tablet 20 mg (has no administration in time range)  citalopram (CELEXA) tablet 20 mg (has no administration in time range)  donepezil (ARICEPT) tablet 10 mg (has no administration in time range)  risperiDONE (RISPERDAL) tablet 4 mg (has no administration in time range)  magnesium oxide (MAG-OX) tablet 400 mg (has no administration in time range)  pantoprazole (PROTONIX) EC tablet 40 mg (has no administration in time range)  apixaban (ELIQUIS) tablet 2.5 mg (has no administration in time range)  ferrous sulfate tablet 325 mg (has no administration in time range)  melatonin tablet 5 mg (has no administration in time range)  benztropine (COGENTIN) tablet 1 mg (has no administration in time range)  divalproex (DEPAKOTE ER) 24 hr tablet 1,000 mg (has no administration in time range)  calcium carbonate (OS-CAL - dosed in mg of elemental calcium) tablet 1,250 mg (has no administration in time range)   loratadine (CLARITIN) tablet 10 mg (has no administration in time range)  sodium chloride (OCEAN) 0.65 % nasal spray 1 spray (has no administration in time range)  latanoprost (XALATAN) 0.005 % ophthalmic solution 1 drop (has no administration in time range)  guaiFENesin-dextromethorphan (ROBITUSSIN DM) 100-10 MG/5ML syrup 10 mL (has no administration in time range)  chlorpheniramine-HYDROcodone (TUSSIONEX) 10-8 MG/5ML suspension 5 mL (has no administration in time range)  ascorbic acid (VITAMIN C) tablet 500 mg (has no administration in time range)  zinc sulfate capsule 220 mg (has no administration in time range)  acetaminophen (TYLENOL) tablet 650 mg (has no administration in time range)  traZODone (DESYREL) tablet 25 mg (has no administration in time range)  magnesium hydroxide (MILK OF MAGNESIA) suspension 30 mL (has no administration in time range)  ondansetron (ZOFRAN) tablet 4 mg (has no administration in time range)    Or  ondansetron (ZOFRAN) injection 4 mg (has no administration in time range)  cefTRIAXone (ROCEPHIN) 2 g in sodium chloride 0.9 % 100 mL IVPB (has no administration in time range)  0.9 %  sodium chloride infusion (has no administration in time range)  atorvastatin (LIPITOR) tablet 10 mg (has no administration in time range)  nirmatrelvir/ritonavir (renal dosing) (PAXLOVID) 2 tablet (has no administration in time range)  amiodarone (PACERONE) tablet 200 mg (has no administration in time range)  acetaminophen (TYLENOL) tablet 1,000 mg (1,000 mg Oral Given 12/18/22 0029)  lactated ringers bolus 1,000 mL (0 mLs Intravenous Stopped 12/18/22 0239)  cefTRIAXone (ROCEPHIN) 2 g in sodium chloride 0.9 % 100 mL IVPB (0 g Intravenous Stopped 12/18/22 0100)  sodium chloride 0.9 % bolus 500 mL (0 mLs Intravenous Stopped 12/18/22 0425)  sodium chloride 0.9 % bolus 1,000 mL (0 mLs Intravenous Stopped 12/18/22 5102)     IMPRESSION / MDM / ASSESSMENT AND PLAN / ED COURSE  I reviewed  the triage vital signs and the nursing notes.  Differential diagnosis includes, but is not limited to, sepsis, pyelonephritis, UTI, cauda equina syndrome,  {Patient presents with symptoms of an acute illness or injury that is potentially life-threatening.  67 year old male presents from his SNF,  found to have evidence of COVID-19 and possible sepsis from cystitis.  Presented in A-fib with RVR and low-grade fever, rate controlled with IV fluids and did not require IV cardioactive medications.  Soft blood pressures are noted but no indications for vasopressors while in my care.  Blood work with mild leukocytosis.  AKI is noted.  Urine with ketones suggestive of dehydration.  Testing positive for COVID.  Procalcitonin is elevated as well as his lactic acid.  Urine with trace leukocytes and will be sent for culture.  Considering his history of urethral strictures ceftriaxone is provided to treat UTI.  Admitted to medicine.  Clinical Course as of 12/18/22 0643  Tue Dec 18, 2022  0009 Reassessed and had the patient change over into a gown.  I was able to do about myself with him.  Not clearly meningitic.  [DS]    Clinical Course User Index [DS] Delton Prairie, MD     FINAL CLINICAL IMPRESSION(S) / ED DIAGNOSES   Final diagnoses:  COVID-19  AKI (acute kidney injury) (HCC)     Rx / DC Orders   ED Discharge Orders     None        Note:  This document was prepared using Dragon voice recognition software and may include unintentional dictation errors.   Delton Prairie, MD 12/18/22 [phone]

## 2022-12-17 NOTE — ED Triage Notes (Signed)
Pt arrived from [place] with a complaint of back pain. Pt is in the memory care. Pt is tachycardic at this time with a fever.

## 2022-12-18 ENCOUNTER — Emergency Department: Payer: Medicare Other

## 2022-12-18 DIAGNOSIS — M545 Low back pain, unspecified: Secondary | ICD-10-CM | POA: Diagnosis present

## 2022-12-18 DIAGNOSIS — N189 Chronic kidney disease, unspecified: Secondary | ICD-10-CM | POA: Diagnosis present

## 2022-12-18 DIAGNOSIS — I480 Paroxysmal atrial fibrillation: Secondary | ICD-10-CM

## 2022-12-18 DIAGNOSIS — E1122 Type 2 diabetes mellitus with diabetic chronic kidney disease: Secondary | ICD-10-CM | POA: Diagnosis present

## 2022-12-18 DIAGNOSIS — N179 Acute kidney failure, unspecified: Secondary | ICD-10-CM | POA: Diagnosis present

## 2022-12-18 DIAGNOSIS — K219 Gastro-esophageal reflux disease without esophagitis: Secondary | ICD-10-CM | POA: Diagnosis present

## 2022-12-18 DIAGNOSIS — I2510 Atherosclerotic heart disease of native coronary artery without angina pectoris: Secondary | ICD-10-CM | POA: Diagnosis present

## 2022-12-18 DIAGNOSIS — N401 Enlarged prostate with lower urinary tract symptoms: Secondary | ICD-10-CM | POA: Diagnosis present

## 2022-12-18 DIAGNOSIS — I672 Cerebral atherosclerosis: Secondary | ICD-10-CM | POA: Diagnosis present

## 2022-12-18 DIAGNOSIS — N390 Urinary tract infection, site not specified: Secondary | ICD-10-CM | POA: Diagnosis present

## 2022-12-18 DIAGNOSIS — E785 Hyperlipidemia, unspecified: Secondary | ICD-10-CM | POA: Diagnosis present

## 2022-12-18 DIAGNOSIS — Z955 Presence of coronary angioplasty implant and graft: Secondary | ICD-10-CM | POA: Diagnosis not present

## 2022-12-18 DIAGNOSIS — E860 Dehydration: Secondary | ICD-10-CM | POA: Diagnosis present

## 2022-12-18 DIAGNOSIS — Z833 Family history of diabetes mellitus: Secondary | ICD-10-CM | POA: Diagnosis not present

## 2022-12-18 DIAGNOSIS — Z87891 Personal history of nicotine dependence: Secondary | ICD-10-CM | POA: Diagnosis not present

## 2022-12-18 DIAGNOSIS — F0153 Vascular dementia, unspecified severity, with mood disturbance: Secondary | ICD-10-CM | POA: Diagnosis present

## 2022-12-18 DIAGNOSIS — E871 Hypo-osmolality and hyponatremia: Secondary | ICD-10-CM | POA: Diagnosis present

## 2022-12-18 DIAGNOSIS — F209 Schizophrenia, unspecified: Secondary | ICD-10-CM | POA: Diagnosis present

## 2022-12-18 DIAGNOSIS — J4489 Other specified chronic obstructive pulmonary disease: Secondary | ICD-10-CM | POA: Diagnosis present

## 2022-12-18 DIAGNOSIS — I129 Hypertensive chronic kidney disease with stage 1 through stage 4 chronic kidney disease, or unspecified chronic kidney disease: Secondary | ICD-10-CM | POA: Diagnosis present

## 2022-12-18 DIAGNOSIS — A4150 Gram-negative sepsis, unspecified: Secondary | ICD-10-CM

## 2022-12-18 DIAGNOSIS — F32A Depression, unspecified: Secondary | ICD-10-CM | POA: Diagnosis present

## 2022-12-18 DIAGNOSIS — R6520 Severe sepsis without septic shock: Secondary | ICD-10-CM | POA: Diagnosis present

## 2022-12-18 DIAGNOSIS — U071 COVID-19: Secondary | ICD-10-CM | POA: Diagnosis present

## 2022-12-18 DIAGNOSIS — I10 Essential (primary) hypertension: Secondary | ICD-10-CM

## 2022-12-18 LAB — TROPONIN I (HIGH SENSITIVITY): Troponin I (High Sensitivity): 11 ng/L (ref <18)

## 2022-12-18 LAB — LACTIC ACID, PLASMA
Lactic Acid, Venous: 2 mmol/L (ref 0.5–1.9)
Lactic Acid, Venous: 1.6 mmol/L (ref 0.5–1.9)
Lactic Acid, Venous: 1.3 mmol/L (ref 0.5–1.9)

## 2022-12-18 LAB — RESP PANEL BY RT-PCR (RSV, FLU A&B, COVID)  RVPGX2
Influenza A by PCR: NEGATIVE
Influenza B by PCR: NEGATIVE
Resp Syncytial Virus by PCR: NEGATIVE
SARS Coronavirus 2 by RT PCR: POSITIVE — AB

## 2022-12-18 LAB — APTT: aPTT: 44 seconds — ABNORMAL HIGH (ref 24–36)

## 2022-12-18 LAB — PROCALCITONIN: Procalcitonin: 0.29 ng/mL

## 2022-12-18 LAB — PROTIME-INR
INR: 1.4 — ABNORMAL HIGH (ref 0.8–1.2)
Prothrombin Time: 16.9 seconds — ABNORMAL HIGH (ref 11.4–15.2)

## 2022-12-18 MED ORDER — SODIUM CHLORIDE 0.9 % IV SOLN: INTRAVENOUS | Status: AC

## 2022-12-18 MED ORDER — AMIODARONE HCL 200 MG PO TABS
200.0000 mg | ORAL_TABLET | Freq: Every day | ORAL | Status: DC
  Administered 2022-12-18 – 2022-12-20 (×3): 200 mg via ORAL
  Filled 2022-12-18 (×3): qty 1

## 2022-12-18 MED ORDER — LACTATED RINGERS IV BOLUS
1000.0000 mL | Freq: Once | INTRAVENOUS | Status: AC
  Administered 2022-12-18: 1000 mL via INTRAVENOUS

## 2022-12-18 MED ORDER — GUAIFENESIN-DM 100-10 MG/5ML PO SYRP: 10.0000 mL | ORAL_SOLUTION | ORAL | Status: DC | PRN

## 2022-12-18 MED ORDER — SODIUM CHLORIDE 0.9 % IV BOLUS
500.0000 mL | Freq: Once | INTRAVENOUS | Status: AC
  Administered 2022-12-18: 500 mL via INTRAVENOUS

## 2022-12-18 MED ORDER — BRIMONIDINE TARTRATE-TIMOLOL 0.2-0.5 % OP SOLN
1.0000 [drp] | Freq: Two times a day (BID) | OPHTHALMIC | Status: DC
  Filled 2022-12-18: qty 5

## 2022-12-18 MED ORDER — ATORVASTATIN CALCIUM 20 MG PO TABS: 10.0000 mg | ORAL_TABLET | Freq: Every day | ORAL | Status: DC

## 2022-12-18 MED ORDER — NIRMATRELVIR/RITONAVIR (PAXLOVID) TABLET (RENAL DOSING): 2.0000 | ORAL_TABLET | Freq: Two times a day (BID) | ORAL | Status: DC

## 2022-12-18 MED ORDER — ONDANSETRON HCL 4 MG PO TABS: 4.0000 mg | ORAL_TABLET | Freq: Four times a day (QID) | ORAL | Status: DC | PRN

## 2022-12-18 MED ORDER — TIMOLOL MALEATE 0.5 % OP SOLN
1.0000 [drp] | Freq: Two times a day (BID) | OPHTHALMIC | Status: DC
  Administered 2022-12-18 – 2022-12-20 (×5): 1 [drp] via OPHTHALMIC
  Filled 2022-12-18: qty 5

## 2022-12-18 MED ORDER — SALINE SPRAY 0.65 % NA SOLN
1.0000 | Freq: Every day | NASAL | Status: DC
  Administered 2022-12-20: 1 via NASAL
  Filled 2022-12-18: qty 44

## 2022-12-18 MED ORDER — BENZTROPINE MESYLATE 1 MG PO TABS
1.0000 mg | ORAL_TABLET | Freq: Every day | ORAL | Status: DC
  Administered 2022-12-18 – 2022-12-19 (×2): 1 mg via ORAL
  Filled 2022-12-18 (×2): qty 1

## 2022-12-18 MED ORDER — MAGNESIUM OXIDE -MG SUPPLEMENT 400 (240 MG) MG PO TABS
400.0000 mg | ORAL_TABLET | Freq: Every day | ORAL | Status: DC
  Administered 2022-12-18 – 2022-12-20 (×3): 400 mg via ORAL
  Filled 2022-12-18 (×4): qty 1

## 2022-12-18 MED ORDER — FERROUS SULFATE 325 (65 FE) MG PO TABS
325.0000 mg | ORAL_TABLET | Freq: Every day | ORAL | Status: DC
  Administered 2022-12-18 – 2022-12-20 (×3): 325 mg via ORAL
  Filled 2022-12-18 (×3): qty 1

## 2022-12-18 MED ORDER — ATORVASTATIN CALCIUM 10 MG PO TABS: 10.0000 mg | ORAL_TABLET | Freq: Every day | ORAL | Status: DC

## 2022-12-18 MED ORDER — CARVEDILOL 6.25 MG PO TABS: 12.5000 mg | ORAL_TABLET | Freq: Two times a day (BID) | ORAL | Status: DC

## 2022-12-18 MED ORDER — AMIODARONE HCL 200 MG PO TABS: 200.0000 mg | ORAL_TABLET | Freq: Every day | ORAL | Status: DC

## 2022-12-18 MED ORDER — TRAZODONE HCL 50 MG PO TABS
25.0000 mg | ORAL_TABLET | Freq: Every evening | ORAL | Status: DC | PRN
  Administered 2022-12-19: 25 mg via ORAL
  Filled 2022-12-18: qty 1

## 2022-12-18 MED ORDER — FUROSEMIDE 40 MG PO TABS: 20.0000 mg | ORAL_TABLET | Freq: Every day | ORAL | Status: DC | PRN

## 2022-12-18 MED ORDER — SODIUM CHLORIDE 0.9 % IV BOLUS
1000.0000 mL | Freq: Once | INTRAVENOUS | Status: AC
  Administered 2022-12-18: 1000 mL via INTRAVENOUS

## 2022-12-18 MED ORDER — LATANOPROST 0.005 % OP SOLN
1.0000 [drp] | Freq: Every day | OPHTHALMIC | Status: DC
  Administered 2022-12-18 – 2022-12-19 (×2): 1 [drp] via OPHTHALMIC
  Filled 2022-12-18: qty 2.5

## 2022-12-18 MED ORDER — SODIUM CHLORIDE 0.9 % IV SOLN: INTRAVENOUS | Status: DC

## 2022-12-18 MED ORDER — ACETAMINOPHEN 325 MG PO TABS: 650.0000 mg | ORAL_TABLET | Freq: Four times a day (QID) | ORAL | Status: DC | PRN

## 2022-12-18 MED ORDER — VITAMIN C 500 MG PO TABS
500.0000 mg | ORAL_TABLET | Freq: Every day | ORAL | Status: DC
  Administered 2022-12-18 – 2022-12-20 (×3): 500 mg via ORAL
  Filled 2022-12-18 (×3): qty 1

## 2022-12-18 MED ORDER — ASPIRIN 81 MG PO TBEC
81.0000 mg | DELAYED_RELEASE_TABLET | Freq: Every day | ORAL | Status: DC
  Administered 2022-12-18 – 2022-12-20 (×3): 81 mg via ORAL
  Filled 2022-12-18 (×3): qty 1

## 2022-12-18 MED ORDER — RISPERIDONE 3 MG PO TABS
4.0000 mg | ORAL_TABLET | Freq: Every day | ORAL | Status: DC
  Administered 2022-12-18 – 2022-12-19 (×2): 4 mg via ORAL
  Filled 2022-12-18 (×2): qty 1

## 2022-12-18 MED ORDER — BRIMONIDINE TARTRATE 0.2 % OP SOLN
1.0000 [drp] | Freq: Two times a day (BID) | OPHTHALMIC | Status: DC
  Administered 2022-12-18 – 2022-12-20 (×5): 1 [drp] via OPHTHALMIC
  Filled 2022-12-18: qty 5

## 2022-12-18 MED ORDER — CALCIUM CARBONATE 1250 (500 CA) MG PO TABS
500.0000 mg | ORAL_TABLET | Freq: Two times a day (BID) | ORAL | Status: DC
  Administered 2022-12-18 – 2022-12-20 (×5): 1250 mg via ORAL
  Filled 2022-12-18 (×6): qty 1

## 2022-12-18 MED ORDER — ONDANSETRON HCL 4 MG/2ML IJ SOLN: 4.0000 mg | Freq: Four times a day (QID) | INTRAMUSCULAR | Status: DC | PRN

## 2022-12-18 MED ORDER — HYDROCOD POLI-CHLORPHE POLI ER 10-8 MG/5ML PO SUER: 5.0000 mL | Freq: Two times a day (BID) | ORAL | Status: DC | PRN

## 2022-12-18 MED ORDER — LORATADINE 10 MG PO TABS
10.0000 mg | ORAL_TABLET | Freq: Every day | ORAL | Status: DC
  Administered 2022-12-18 – 2022-12-20 (×3): 10 mg via ORAL
  Filled 2022-12-18 (×3): qty 1

## 2022-12-18 MED ORDER — APIXABAN 2.5 MG PO TABS
2.5000 mg | ORAL_TABLET | Freq: Two times a day (BID) | ORAL | Status: DC
  Administered 2022-12-18 (×2): 2.5 mg via ORAL
  Filled 2022-12-18 (×4): qty 1

## 2022-12-18 MED ORDER — PANTOPRAZOLE SODIUM 40 MG PO TBEC
40.0000 mg | DELAYED_RELEASE_TABLET | Freq: Every day | ORAL | Status: DC
  Administered 2022-12-18 – 2022-12-20 (×3): 40 mg via ORAL
  Filled 2022-12-18 (×3): qty 1

## 2022-12-18 MED ORDER — CITALOPRAM HYDROBROMIDE 20 MG PO TABS
20.0000 mg | ORAL_TABLET | Freq: Every day | ORAL | Status: DC
  Administered 2022-12-18 – 2022-12-20 (×3): 20 mg via ORAL
  Filled 2022-12-18 (×3): qty 1

## 2022-12-18 MED ORDER — MAGNESIUM HYDROXIDE 400 MG/5ML PO SUSP: 30.0000 mL | Freq: Every day | ORAL | Status: DC | PRN

## 2022-12-18 MED ORDER — SODIUM CHLORIDE 0.9 % IV SOLN
2.0000 g | Freq: Once | INTRAVENOUS | Status: AC
  Administered 2022-12-18: 2 g via INTRAVENOUS
  Filled 2022-12-18: qty 20

## 2022-12-18 MED ORDER — NIRMATRELVIR/RITONAVIR (PAXLOVID)TABLET: 3.0000 | ORAL_TABLET | Freq: Two times a day (BID) | ORAL | Status: DC

## 2022-12-18 MED ORDER — DIVALPROEX SODIUM ER 500 MG PO TB24
1000.0000 mg | ORAL_TABLET | Freq: Two times a day (BID) | ORAL | Status: DC
  Administered 2022-12-18 – 2022-12-20 (×5): 1000 mg via ORAL
  Filled 2022-12-18 (×2): qty 2
  Filled 2022-12-18: qty 4
  Filled 2022-12-18 (×2): qty 2

## 2022-12-18 MED ORDER — MELATONIN 5 MG PO TABS
5.0000 mg | ORAL_TABLET | Freq: Every day | ORAL | Status: DC
  Administered 2022-12-18 – 2022-12-19 (×2): 5 mg via ORAL
  Filled 2022-12-18 (×2): qty 1

## 2022-12-18 MED ORDER — DONEPEZIL HCL 5 MG PO TABS
10.0000 mg | ORAL_TABLET | Freq: Every day | ORAL | Status: DC
  Administered 2022-12-18 – 2022-12-19 (×2): 10 mg via ORAL
  Filled 2022-12-18 (×2): qty 2

## 2022-12-18 MED ORDER — ZINC SULFATE 220 (50 ZN) MG PO CAPS
220.0000 mg | ORAL_CAPSULE | Freq: Every day | ORAL | Status: DC
  Administered 2022-12-18 – 2022-12-20 (×3): 220 mg via ORAL
  Filled 2022-12-18 (×3): qty 1

## 2022-12-18 MED ORDER — SODIUM CHLORIDE 0.9 % IV SOLN
2.0000 g | INTRAVENOUS | Status: DC
  Administered 2022-12-18 – 2022-12-20 (×2): 2 g via INTRAVENOUS
  Filled 2022-12-18 (×2): qty 20

## 2022-12-18 NOTE — Progress Notes (Signed)
Brief hospitalist update note.  This is a nonbillable note.  Please see same-day H&P for full billable details.  Briefly, this is a 67 year old male with history significant for atrial fibrillation, schizophrenia, vascular dementia who lives full-time in skilled nursing facility who presented with acute onset low back pain.  On presentation in the ED was found to have fever, tachycardia, tachypnea.  Admits to urinary frequency and dysuria.  Subjective fevers and chills endorsed.  Clinical presentation consistent with UTI and associated sepsis.  Also found to be in atrial fibrillation with rapid ventricular response.  Ventricular rates have improved.  Also incidentally found to have COVID-19 infection.  Was initially on Paxlovid, this has been discontinued as there is a significant drug drug interaction with amiodarone.  Patient has no pneumonia, no oxygen requirement.  On IV antibiotics, intravenous fluids.  Anticipate discharge in 1 to 2 days.  Ralene Muskrat MD  No charge

## 2022-12-18 NOTE — Assessment & Plan Note (Signed)
-  This could be contributing to his sepsis. - The patient will be placed on Paxlovid. - Will follow inflammatory markers. - The patient will be placed on vitamin C and zinc sulfate.

## 2022-12-18 NOTE — ED Notes (Signed)
Patient's bed saturated with urine. Fresh linens provided. Clean brief applied. Patient positioned for comfort.

## 2022-12-18 NOTE — Assessment & Plan Note (Signed)
-  We will continue Aricept, and Celexa as well as Depakote.

## 2022-12-18 NOTE — Assessment & Plan Note (Addendum)
-  The patient will be admitted to a cardiac telemetry bed. - He will be hydrated with IV normal saline. - We will follow BMPs. - We will avoid nephrotoxins.

## 2022-12-18 NOTE — Assessment & Plan Note (Signed)
-  We will hold off antihypertensives given his borderline BP.

## 2022-12-18 NOTE — Assessment & Plan Note (Addendum)
-  This is manifested by fever, tachycardia, tachypnea and leukocytosis. - The patient will be placed on IV Rocephin and will follow urine and blood cultures. - He meets severe sepsis criteria based on lactic acid of 2.4 and mild hypotension.

## 2022-12-18 NOTE — H&P (Signed)
[place]   PATIENT NAME: Brian Dodson    MR#:  [id number]  DATE OF BIRTH:  26-Apr-1956  DATE OF ADMISSION:  12/17/2022  PRIMARY CARE PHYSICIAN: [name], MD   Patient is coming from: SNF   REQUESTING/REFERRING PHYSICIAN: Vladimir Crofts, MD  CHIEF COMPLAINT:   Chief Complaint  Patient presents with   Back Pain    HISTORY OF PRESENT ILLNESS:  Brian Dodson is a 67 y.o. African-American male with medical history significant for atrial fibrillation, BPH, coronary artery disease, COPD, type diabetes mellitus, hypertension, dyslipidemia, schizophrenia and vascular dementia, who presented to the emergency room with acute onset of low back pain at his SNF which she denied in the ER.  He was noted to have fever, tachycardia and tachypnea.  He admits to urinary frequency and dysuria as well as fever and chills.  He admits to dry cough occasionally productive of whitish sputum with occasional dyspnea.  No nausea or vomiting or diarrhea.  No chest pain or palpitations.  He is overall poor historian due to his dementia.  ED Course: Upon admission to the ER, temperature was 100.8, heart rate was 132 and respiratory rate was 24.  Later on heart rate was down to 91.  Labs revealed hyponatremia of 133, BUN of 33 and creatinine 1.57 up from 19/1.04 on 06/17/2022.  Albumin was 3.4.  High-sensitivity troponin I was 11 and later the same.  Lactic acid was 2.4 and later 2.  Procalcitonin was 0.35 and later 0.29.  CBC showed leukocytosis of 13.4 with neutrophilia.  H&H were 10.8 and 33.4 that is chronic EKG as reviewed by me : A-fib with RVR of 139, prolonged QT interval with QTc 490 MS. Imaging: Portable chest x-ray showed stable mild cardiomegaly with no acute chest findings.  The patient was given 2 g IV Rocephin, 1 g p.o. Tylenol and 1 L bolus of IV lactated Ringer.  I ordered p.o. Paxlovid.  He will be admitted to a cardiac telemetry bed. PAST MEDICAL HISTORY:   Past Medical History:   Diagnosis Date   Anemia    Asthma    Atrial fibrillation ([place])    Benign prostatic hypertrophy with lower urinary tract symptoms (LUTS)    Bulbous urethral stricture    CAD (coronary artery disease)    s/p coronary stent 2003   CKD (chronic kidney disease)    Constipation    COPD (chronic obstructive pulmonary disease) (HCC)    Diabetes mellitus without complication (HCC)    type 2   Dizziness    Dyspnea    GERD (gastroesophageal reflux disease)    Gross hematuria    Hyperlipemia    Hypertension    Lumbago    Palpitation    Schizophrenia ([place])    Vascular dementia ([place])     PAST SURGICAL HISTORY:   Past Surgical History:  Procedure Laterality Date   CATARACT EXTRACTION W/PHACO Right 01/16/2022   Procedure: CATARACT EXTRACTION PHACO AND INTRAOCULAR LENS PLACEMENT (IOC) RIGHT DIABETIC 4.48 00:34.3;  Surgeon: Birder Robson, MD;  Location: [facility];  Service: Ophthalmology;  Laterality: Right;  Diabetic   CATARACT EXTRACTION W/PHACO Left 03/13/2022   Procedure: CATARACT EXTRACTION PHACO AND INTRAOCULAR LENS PLACEMENT (IOC) LEFT DIABETIC malyugin 8.39 01:02.9 ;  Surgeon: Birder Robson, MD;  Location: [place];  Service: Ophthalmology;  Laterality: Left;  Diabetic   COLONOSCOPY     COLONOSCOPY WITH PROPOFOL N/A 09/22/2021   Procedure: COLONOSCOPY WITH PROPOFOL;  Surgeon:  Regis Bill, MD;  Location: [facility];  Service: Endoscopy;  Laterality: N/A;   COLONOSCOPY WITH PROPOFOL N/A 01/02/2022   Procedure: COLONOSCOPY WITH PROPOFOL;  Surgeon: Regis Bill, MD;  Location: [facility];  Service: Endoscopy;  Laterality: N/A;   CORONARY ANGIOPLASTY WITH STENT PLACEMENT  11/2003   kidney stent     TEE WITHOUT CARDIOVERSION N/A 09/20/2021   Procedure: TRANSESOPHAGEAL ECHOCARDIOGRAM (TEE);  Surgeon: Dalia Heading, MD;  Location: [facility];  Service: Cardiovascular;  Laterality: N/A;    SOCIAL HISTORY:   Social History   Tobacco Use    Smoking status: Former   Smokeless tobacco: Never  Substance Use Topics   Alcohol use: No    FAMILY HISTORY:   Family History  Problem Relation Age of Onset   Diabetes Other    Bladder Cancer Neg Hx    Kidney cancer Neg Hx    Prostate cancer Neg Hx     DRUG ALLERGIES:   Allergies  Allergen Reactions   Sulfa Antibiotics Other (See Comments)    Unknown reaction   Xarelto [Rivaroxaban]     REVIEW OF SYSTEMS:   ROS As per history of present illness. All pertinent systems were reviewed above. Constitutional, HEENT, cardiovascular, respiratory, GI, GU, musculoskeletal, neuro, psychiatric, endocrine, integumentary and hematologic systems were reviewed and are otherwise negative/unremarkable except for positive findings mentioned above in the HPI.   MEDICATIONS AT HOME:   Prior to Admission medications   Medication Sig Start Date End Date Taking? Authorizing Provider  amiodarone (PACERONE) 200 MG tablet Take 1 tablet (200 mg total) by mouth daily. 07/26/21  Yes Charise Killian, MD  aspirin 81 MG tablet Take 81 mg by mouth daily.    Yes [provider]  atorvastatin (LIPITOR) 10 MG tablet Take 10 mg by mouth daily. 05/18/15  Yes [provider]  benztropine (COGENTIN) 1 MG tablet Take 1 mg by mouth at bedtime.    Yes [provider]  brimonidine-timolol (COMBIGAN) 0.2-0.5 % ophthalmic solution Place 1 drop into both eyes every 12 (twelve) hours. Wait 3 to 5 minutes between drops   Yes [provider]  citalopram (CELEXA) 20 MG tablet Take 20 mg by mouth daily.   Yes [provider]  divalproex (DEPAKOTE ER) 500 MG 24 hr tablet Take 2 tablets (1,000 mg total) by mouth 2 (two) times daily. 06/18/22  Yes Esaw Grandchild A, DO  donepezil (ARICEPT) 10 MG tablet Take 10 mg by mouth at bedtime.    Yes [provider]  ELIQUIS 5 MG TABS tablet Take 5 mg by mouth 2 (two) times daily. 05/18/15  Yes [provider]  ferrous sulfate  325 (65 FE) MG tablet Take 1 tablet (325 mg total) by mouth daily with breakfast. (Hold for 1 week, resume on 06/25/22) 06/18/22  Yes Esaw Grandchild A, DO  furosemide (LASIX) 20 MG tablet Take 20 mg by mouth daily as needed. For edema (swelling) 06/22/21  Yes [provider]  leptospermum manuka honey (MEDIHONEY) PSTE paste Apply 1 Application topically daily. 06/18/22  Yes Esaw Grandchild A, DO  loratadine (CLARITIN) 10 MG tablet Take 10 mg by mouth daily. 05/01/22  Yes [provider]  LUMIGAN 0.01 % SOLN Place 1 drop into both eyes at bedtime. 07/06/21  Yes [provider]  Magnesium Oxide 400 MG CAPS Take 1 capsule (400 mg total) by mouth daily. (Hold for 1 week, resume on 06/25/22) 06/18/22  Yes Esaw Grandchild A, DO  melatonin  5 MG TABS Take 5 mg by mouth at bedtime.   Yes [provider]  metFORMIN (GLUCOPHAGE) 500 MG tablet Take 500 mg by mouth 2 (two) times daily with a meal.   Yes [provider]  omeprazole (PRILOSEC) 20 MG capsule Take 20 mg by mouth 2 (two) times daily before a meal. May open and sprinkle in applesauce.   Yes [provider]  [place] (OYSTER CALCIUM) 500 MG TABS tablet Take 500 mg of elemental calcium by mouth 2 (two) times daily.   Yes [provider]  risperidone (RISPERDAL) 4 MG tablet Take 4 mg by mouth at bedtime. 05/18/15  Yes [provider]  sodium chloride (OCEAN) 0.65 % SOLN nasal spray Place 1 spray into both nostrils daily.   Yes [provider]  zinc oxide 20 % ointment Apply 1 Application topically 2 (two) times daily as needed for irritation (to left buttock).   Yes [provider]  acetaminophen (TYLENOL) 500 MG tablet Take 1,000 mg by mouth every 6 (six) hours as needed for mild pain.    [provider]  carvedilol (COREG) 12.5 MG tablet Take 12.5 mg by mouth in the morning and at bedtime. Patient not taking: Reported on 12/17/2022    [provider]   diclofenac Sodium (VOLTAREN) 1 % GEL Apply 2 g topically 4 (four) times daily as needed. Apply to neck and shoulders Patient not taking: Reported on 12/17/2022 08/18/21   Jennye Boroughs, MD  Difluprednate 0.05 % EMUL Apply to eye. Patient not taking: Reported on 12/17/2022 04/10/22   [provider]  ILEVRO 0.3 % ophthalmic suspension SMARTSIG:In Eye(s) Patient not taking: Reported on 12/17/2022 04/02/22   [provider]  lanolin/mineral oil (KERI/THERA-DERM) LOTN Apply 1 application topically as needed for dry skin. Apply to face after washing face daily    [provider]  Menthol, Topical Analgesic, (BIOFREEZE) 4 % GEL Apply 1 application topically 3 (three) times daily. To lower back area as needed for pain    [provider]      VITAL SIGNS:  Blood pressure 102/77, pulse 81, temperature 98.8 F (37.1 C), temperature source Oral, resp. rate 13, SpO2 96 %.  PHYSICAL EXAMINATION:  Physical Exam  GENERAL:  67 y.o.-year-old African American male patient lying in the bed with no acute distress.  EYES: Pupils equal, round, reactive to light and accommodation. No scleral icterus. Extraocular muscles intact.  HEENT: Head atraumatic, normocephalic. Oropharynx and nasopharynx clear.  NECK:  Supple, no jugular venous distention. No thyroid enlargement, no tenderness.  LUNGS: Normal breath sounds bilaterally, no wheezing, rales,rhonchi or crepitation. No use of accessory muscles of respiration.  CARDIOVASCULAR: Irregularly irregular rhythm, S1, S2 normal. No murmurs, rubs, or gallops.  ABDOMEN: Soft, nondistended, nontender. Bowel sounds present. No organomegaly or mass.  EXTREMITIES: No pedal edema, cyanosis, or clubbing.  NEUROLOGIC: Cranial nerves II through XII are intact. Muscle strength 5/5 in all extremities. Sensation intact. Gait not checked.  PSYCHIATRIC: The patient is alert and oriented x 3.  Normal affect and good eye contact. SKIN: No obvious rash,  lesion, or ulcer.   LABORATORY PANEL:   CBC Recent Labs  Lab 12/17/22 2300  WBC 13.4*  HGB 10.8*  HCT 33.4*  PLT 166   ------------------------------------------------------------------------------------------------------------------  Chemistries  Recent Labs  Lab 12/17/22 2300  NA 133*  K 4.2  CL 101  CO2 23  GLUCOSE 139*  BUN 33*  CREATININE 1.57*  CALCIUM 8.5*  AST 25  ALT 11  ALKPHOS 49  BILITOT 0.8   ------------------------------------------------------------------------------------------------------------------  Cardiac Enzymes No results for input(s): "TROPONINI" in the last 168 hours. ------------------------------------------------------------------------------------------------------------------  RADIOLOGY:  CT HEAD WO CONTRAST (5MM)  Result Date: 12/18/2022 CLINICAL DATA:  Altered mental status EXAM: CT HEAD WITHOUT CONTRAST TECHNIQUE: Contiguous axial images were obtained from the base of the skull through the vertex without intravenous contrast. RADIATION DOSE REDUCTION: This exam was performed according to the departmental dose-optimization program which includes automated exposure control, adjustment of the mA and/or kV according to patient size and/or use of iterative reconstruction technique. COMPARISON:  09/17/2021 FINDINGS: Brain: No evidence of acute infarction, hemorrhage, hydrocephalus, extra-axial collection or mass lesion/mass effect. Chronic white matter ischemic changes are again noted and stable. Basal ganglia calcifications are seen. Vascular: No hyperdense vessel or unexpected calcification. Skull: Normal. Negative for fracture or focal lesion. Sinuses/Orbits: No acute finding. Other: None. IMPRESSION: Chronic ischemic changes without acute abnormality. Electronically Signed   By: Inez Catalina M.D.   On: 12/18/2022 00:25   DG Chest Portable 1 View  Result Date: 12/17/2022 CLINICAL DATA:  Back pain. EXAM: PORTABLE CHEST 1 VIEW COMPARISON:   Chest radiograph 09/23/2021. FINDINGS: Mild cardiomegaly is stable.The mediastinal contours are normal. The lungs are clear. Pulmonary vasculature is normal. No consolidation, pleural effusion, or pneumothorax. No acute osseous abnormalities are seen. IMPRESSION: Stable mild cardiomegaly. No acute chest findings. Electronically Signed   By: Keith Rake M.D.   On: 12/17/2022 23:32      IMPRESSION AND PLAN:  Assessment and Plan: * AKI (acute kidney injury) ([place]) - The patient will be admitted to a cardiac telemetry bed. - He will be hydrated with IV normal saline. - We will follow BMPs. - We will avoid nephrotoxins.  Sepsis due to gram-negative UTI [facility]) - This is manifested by fever, tachycardia, tachypnea and leukocytosis. - The patient will be placed on IV Rocephin and will follow urine and blood cultures. - He meets severe sepsis criteria based on lactic acid of 2.4 and mild hypotension.  COVID-19 virus infection - This could be contributing to his sepsis. - The patient will be placed on Paxlovid. - Will follow inflammatory markers. - The patient will be placed on vitamin C and zinc sulfate.  Paroxysmal atrial fibrillation with RVR (HCC) - This is likely related to volume depletion and dehydration as well as sepsis. - We will continue amiodarone and Eliquis.  Essential hypertension - We will hold off antihypertensives given his borderline BP.  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[place]) - We will continue Aricept, and Celexa as well as Depakote.  Diabetes mellitus type 2, controlled, without complications ([place]) - The patient will be placed on supplemental coverage with NovoLog. - We will hold off metformin.       DVT prophylaxis: Lovenox.  Advanced Care Planning:  Code Status: full code.  Family Communication:  The plan of care was discussed in details with the patient (and family). I answered all questions. The patient agreed to proceed with the above mentioned  plan. Further management will depend upon hospital course. Disposition Plan: Back to previous home environment Consults called: none.  All the records are reviewed and case discussed with ED provider.  Status is: Inpatient   At the time of the admission, it appears that the appropriate admission status for this patient is inpatient.  This is judged to be reasonable and necessary in order to provide the required intensity of service to ensure the patient's safety given the presenting symptoms,  physical exam findings and initial radiographic and laboratory data in the context of comorbid conditions.  The patient requires inpatient status due to high intensity of service, high risk of further deterioration and high frequency of surveillance required.  I certify that at the time of admission, it is my clinical judgment that the patient will require inpatient hospital care extending more than 2 midnights.                            Dispo: The patient is from: Home              Anticipated d/c is to: Home              Patient currently is not medically stable to d/c.              Difficult to place patient: No  Hannah Beat M.D on 12/18/2022 at 3:28 AM  [facility]   From 7 PM-7 AM, contact night-coverage [email or web address]  CC: Primary care physician; Galvin Proffer, MD

## 2022-12-18 NOTE — Assessment & Plan Note (Signed)
-  This is likely related to volume depletion and dehydration as well as sepsis. - We will continue amiodarone and Eliquis.

## 2022-12-18 NOTE — Assessment & Plan Note (Signed)
-  The patient will be placed on supplemental coverage with NovoLog. - We will hold off metformin. 

## 2022-12-19 DIAGNOSIS — N179 Acute kidney failure, unspecified: Secondary | ICD-10-CM | POA: Diagnosis not present

## 2022-12-19 LAB — CBC WITH DIFFERENTIAL/PLATELET
Abs Immature Granulocytes: 0.09 10*3/uL — ABNORMAL HIGH (ref 0.00–0.07)
Basophils Absolute: 0 10*3/uL (ref 0.0–0.1)
Basophils Relative: 0 %
Eosinophils Absolute: 0.2 10*3/uL (ref 0.0–0.5)
Eosinophils Relative: 2 %
HCT: 30.3 % — ABNORMAL LOW (ref 39.0–52.0)
Hemoglobin: 9.7 g/dL — ABNORMAL LOW (ref 13.0–17.0)
Immature Granulocytes: 1 %
Lymphocytes Relative: 28 %
Lymphs Abs: 2.6 10*3/uL (ref 0.7–4.0)
MCH: 32.4 pg (ref 26.0–34.0)
MCHC: 32 g/dL (ref 30.0–36.0)
MCV: 101.3 fL — ABNORMAL HIGH (ref 80.0–100.0)
Monocytes Absolute: 0.9 10*3/uL (ref 0.1–1.0)
Monocytes Relative: 10 %
Neutro Abs: 5.6 10*3/uL (ref 1.7–7.7)
Neutrophils Relative %: 59 %
Platelets: 152 10*3/uL (ref 150–400)
RBC: 2.99 MIL/uL — ABNORMAL LOW (ref 4.22–5.81)
RDW: 14.1 % (ref 11.5–15.5)
WBC: 9.4 10*3/uL (ref 4.0–10.5)
nRBC: 0 % (ref 0.0–0.2)

## 2022-12-19 LAB — COMPREHENSIVE METABOLIC PANEL
ALT: 11 U/L (ref 0–44)
AST: 29 U/L (ref 15–41)
Albumin: 2.7 g/dL — ABNORMAL LOW (ref 3.5–5.0)
Alkaline Phosphatase: 36 U/L — ABNORMAL LOW (ref 38–126)
Anion gap: 5 (ref 5–15)
BUN: 24 mg/dL — ABNORMAL HIGH (ref 8–23)
CO2: 28 mmol/L (ref 22–32)
Calcium: 8.4 mg/dL — ABNORMAL LOW (ref 8.9–10.3)
Chloride: 105 mmol/L (ref 98–111)
Creatinine, Ser: 1.06 mg/dL (ref 0.61–1.24)
GFR, Estimated: >60 mL/min (ref >60)
Glucose, Bld: 106 mg/dL — ABNORMAL HIGH (ref 70–99)
Potassium: 4.6 mmol/L (ref 3.5–5.1)
Sodium: 138 mmol/L (ref 135–145)
Total Bilirubin: 0.6 mg/dL (ref 0.3–1.2)
Total Protein: 5.9 g/dL — ABNORMAL LOW (ref 6.5–8.1)

## 2022-12-19 LAB — MRSA NEXT GEN BY PCR, NASAL: MRSA by PCR Next Gen: NOT DETECTED

## 2022-12-19 MED ORDER — APIXABAN 5 MG PO TABS
5.0000 mg | ORAL_TABLET | Freq: Two times a day (BID) | ORAL | Status: DC
  Administered 2022-12-19 – 2022-12-20 (×3): 5 mg via ORAL
  Filled 2022-12-19 (×3): qty 1

## 2022-12-19 NOTE — Progress Notes (Signed)
PROGRESS NOTE  Brian Dodson YNW:[id number] DOB: November 11, 1956 DOA: 12/17/2022 PCP: Galvin Proffer, MD  Hospital Course/Subjective: 67 year old male with history significant for atrial fibrillation, schizophrenia, vascular dementia who lives full-time in skilled nursing facility who presented with acute onset low back pain. On presentation in the ED was found to have fever, tachycardia, tachypnea. Admits to urinary frequency and dysuria. Subjective fevers and chills endorsed.  Was also found to have COVID infection and AFib RVR, he is on room air and he is currently in NSR.   Seen this AM as his RN leaving. He is doing well has no complaints. Remains on room air.   Assessment and Plan: * AKI (acute kidney injury) (HCC) - The patient will be admitted to a cardiac telemetry bed. - He will be hydrated with IV normal saline, cont this - We will follow BMPs which show improving Cr - We will avoid nephrotoxins. Lab Results  Component Value Date   CREATININE 1.06 12/19/2022   CREATININE 1.57 (H) 12/17/2022   CREATININE 1.04 06/17/2022   Sepsis due to gram-negative UTI (HCC) - This is manifested by fever, tachycardia, tachypnea and leukocytosis which are improving - cont IV Rocephin and follow urine and blood cultures, await sensitivities - He meets severe sepsis criteria based on lactic acid of 2.4 and mild hypotension.  COVID-19 virus infection - This could be contributing to his sepsis. - was placed on Paxlovid but DC due to interaction with amio - Will follow inflammatory markers. - The patient will be placed on vitamin C and zinc sulfate.  Paroxysmal atrial fibrillation with RVR (HCC) - This is likely related to volume depletion and dehydration as well as sepsis. - We will continue amiodarone and Eliquis, now in NSR per telemetry  Essential hypertension - We will hold off antihypertensives given his borderline BP.  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HCC) - We will  continue Aricept, and Celexa as well as Depakote.  Diabetes mellitus type 2, controlled, without complications (HCC) - The patient will be placed on supplemental coverage with NovoLog. - We will hold off metformin, especially in light of AKI on admission  DVT Prophylaxis: Eliquis  Code Status: FULL   Family Communication: None present.   Disposition Plan: Likely back to SNF in 1-2 days pending urine culture. Have asked TOC to inquire about ability to return given COVID positive status.   Consultants: None   Procedures: None   Antimicrobials: Anti-infectives (From admission, onward)    Start     Dose/Rate Route Frequency Ordered Stop   12/18/22 2200  cefTRIAXone (ROCEPHIN) 2 g in sodium chloride 0.9 % 100 mL IVPB        2 g 200 mL/hr over 30 Minutes Intravenous Every 24 hours 12/18/22 0233 12/24/22 2159   12/18/22 1000  nirmatrelvir/ritonavir (renal dosing) (PAXLOVID) 2 tablet  Status:  Discontinued        2 tablet Oral 2 times daily 12/18/22 0322 12/18/22 0831   12/18/22 0245  nirmatrelvir/ritonavir (PAXLOVID) 3 tablet  Status:  Discontinued        3 tablet Oral 2 times daily 12/18/22 0231 12/18/22 0322   12/18/22 0015  cefTRIAXone (ROCEPHIN) 2 g in sodium chloride 0.9 % 100 mL IVPB        2 g 200 mL/hr over 30 Minutes Intravenous  Once 12/18/22 0002 12/18/22 0100      Objective: Vitals:   12/18/22 1930 12/18/22 2004 12/18/22 2348 12/19/22 0405  BP: 94/70 95/75 104/74 92/67  Pulse: 72  70 63 68  Resp: 14 20 18 18   Temp:  98.2 F (36.8 C) 98 F (36.7 C) 98.5 F (36.9 C)  TempSrc:    Oral  SpO2: 98% 98% 100% 93%    Intake/Output Summary (Last 24 hours) at 12/19/2022 1021 Last data filed at 12/19/2022 0551 Gross per 24 hour  Intake 1100.06 ml  Output 650 ml  Net 450.06 ml   There were no vitals filed for this visit. Exam: General:  Alert, oriented, calm, in no acute distress, resting on room air Eyes: EOMI, clear sclerea Neck: supple, no masses, trachea  mildline  Cardiovascular: RRR, no murmurs or rubs, no peripheral edema  Respiratory: clear to auscultation bilaterally, no wheezes, no crackles  Abdomen: soft, nontender, nondistended, normal bowel tones heard  Skin: dry, no rashes  Musculoskeletal: no joint effusions, normal range of motion  Psychiatric: appropriate affect, normal speech  Neurologic: extraocular muscles intact, clear speech, moving all extremities with intact sensorium   Data Reviewed: CBC: Recent Labs  Lab 12/17/22 2300 12/19/22 0226  WBC 13.4* 9.4  NEUTROABS 9.7* 5.6  HGB 10.8* 9.7*  HCT 33.4* 30.3*  MCV 100.0 101.3*  PLT 166 536   Basic Metabolic Panel: Recent Labs  Lab 12/17/22 2300 12/19/22 0226  NA 133* 138  K 4.2 4.6  CL 101 105  CO2 23 28  GLUCOSE 139* 106*  BUN 33* 24*  CREATININE 1.57* 1.06  CALCIUM 8.5* 8.4*   GFR: CrCl cannot be calculated (Unknown ideal weight.). Liver Function Tests: Recent Labs  Lab 12/17/22 2300 12/19/22 0226  AST 25 29  ALT 11 11  ALKPHOS 49 36*  BILITOT 0.8 0.6  PROT 7.3 5.9*  ALBUMIN 3.4* 2.7*   No results for input(s): "LIPASE", "AMYLASE" in the last 168 hours. No results for input(s): "AMMONIA" in the last 168 hours. Coagulation Profile: Recent Labs  Lab 12/18/22 0309  INR 1.4*   Cardiac Enzymes: No results for input(s): "CKTOTAL", "CKMB", "CKMBINDEX", "TROPONINI" in the last 168 hours. BNP (last 3 results) No results for input(s): "PROBNP" in the last 8760 hours. HbA1C: No results for input(s): "HGBA1C" in the last 72 hours. CBG: No results for input(s): "GLUCAP" in the last 168 hours. Lipid Profile: No results for input(s): "CHOL", "HDL", "LDLCALC", "TRIG", "CHOLHDL", "LDLDIRECT" in the last 72 hours. Thyroid Function Tests: No results for input(s): "TSH", "T4TOTAL", "FREET4", "T3FREE", "THYROIDAB" in the last 72 hours. Anemia Panel: No results for input(s): "VITAMINB12", "FOLATE", "FERRITIN", "TIBC", "IRON", "RETICCTPCT" in the last 72  hours. Urine analysis:    Component Value Date/Time   COLORURINE YELLOW (A) 12/17/2022 2312   APPEARANCEUR HAZY (A) 12/17/2022 2312   APPEARANCEUR Clear 03/14/2016 1027   LABSPEC 1.026 12/17/2022 2312   LABSPEC 1.017 01/29/2014 1545   PHURINE 6.0 12/17/2022 2312   GLUCOSEU NEGATIVE 12/17/2022 2312   GLUCOSEU Negative 01/29/2014 1545   HGBUR NEGATIVE 12/17/2022 2312   BILIRUBINUR NEGATIVE 12/17/2022 2312   BILIRUBINUR Negative 03/14/2016 1027   BILIRUBINUR Negative 01/29/2014 1545   KETONESUR 5 (A) 12/17/2022 2312   PROTEINUR 30 (A) 12/17/2022 2312   NITRITE NEGATIVE 12/17/2022 2312   LEUKOCYTESUR TRACE (A) 12/17/2022 2312   LEUKOCYTESUR Negative 01/29/2014 1545   Sepsis Labs: @LABRCNTIP (procalcitonin:4,lacticidven:4)  ) Recent Results (from the past 240 hour(s))  Blood culture (single)     Status: None (Preliminary result)   Collection Time: 12/17/22 11:00 PM   Specimen: BLOOD  Result Value Ref Range Status   Specimen Description BLOOD  RIGHT FOREARM  Final  Special Requests   Final    BOTTLES DRAWN AEROBIC AND ANAEROBIC Blood Culture adequate volume   Culture   Final    NO GROWTH 2 DAYS Performed at [facility], [address] [place], [place] [address]    Report Status PENDING  Incomplete  Urine Culture (for pregnant, neutropenic or urologic patients or patients with an indwelling urinary catheter)     Status: Abnormal (Preliminary result)   Collection Time: 12/17/22 11:12 PM   Specimen: Urine, Catheterized  Result Value Ref Range Status   Specimen Description   Final    URINE, CATHETERIZED Performed at [facility], [address] [place], [place] [address]    Special Requests   Final    NONE Performed at [facility], [address] [place], [place] [address]    Culture (A)  Final    >=100,000 COLONIES/mL KLEBSIELLA PNEUMONIAE SUSCEPTIBILITIES TO FOLLOW Performed at [facility], [place] [address], [place],   [address]    Report Status PENDING  Incomplete  Resp panel by RT-PCR (RSV, Flu A&B, Covid) Anterior Nasal Swab     Status: Abnormal   Collection Time: 12/18/22 12:38 AM   Specimen: Anterior Nasal Swab  Result Value Ref Range Status   SARS Coronavirus 2 by RT PCR POSITIVE (A) NEGATIVE Final    Comment: (NOTE) SARS-CoV-2 target nucleic acids are DETECTED.  The SARS-CoV-2 RNA is generally detectable in upper respiratory specimens during the acute phase of infection. Positive results are indicative of the presence of the identified virus, but do not rule out bacterial infection or co-infection with other pathogens not detected by the test. Clinical correlation with patient history and other diagnostic information is necessary to determine patient infection status. The expected result is Negative.  Fact Sheet for Patients: EntrepreneurPulse.com.au  Fact Sheet for Healthcare Providers: IncredibleEmployment.be  This test is not yet approved or cleared by the [place] FDA and  has been authorized for detection and/or diagnosis of SARS-CoV-2 by FDA under an Emergency Use Authorization (EUA).  This EUA will remain in effect (meaning this test can be used) for the duration of  the COVID-19 declaration under Section 564(b)(1) of the A ct, 21 U.S.C. section 360bbb-3(b)(1), unless the authorization is terminated or revoked sooner.     Influenza A by PCR NEGATIVE NEGATIVE Final   Influenza B by PCR NEGATIVE NEGATIVE Final    Comment: (NOTE) The Xpert Xpress SARS-CoV-2/FLU/RSV plus assay is intended as an aid in the diagnosis of influenza from Nasopharyngeal swab specimens and should not be used as a sole basis for treatment. Nasal washings and aspirates are unacceptable for Xpert Xpress SARS-CoV-2/FLU/RSV testing.  Fact Sheet for Patients: EntrepreneurPulse.com.au  Fact Sheet for Healthcare  Providers: IncredibleEmployment.be  This test is not yet approved or cleared by the [place] FDA and has been authorized for detection and/or diagnosis of SARS-CoV-2 by FDA under an Emergency Use Authorization (EUA). This EUA will remain in effect (meaning this test can be used) for the duration of the COVID-19 declaration under Section 564(b)(1) of the Act, 21 U.S.C. section 360bbb-3(b)(1), unless the authorization is terminated or revoked.     Resp Syncytial Virus by PCR NEGATIVE NEGATIVE Final    Comment: (NOTE) Fact Sheet for Patients: EntrepreneurPulse.com.au  Fact Sheet for Healthcare Providers: IncredibleEmployment.be  This test is not yet approved or cleared by the [place] FDA and has been authorized for detection and/or diagnosis of SARS-CoV-2 by FDA under an Emergency Use Authorization (  EUA). This EUA will remain in effect (meaning this test can be used) for the duration of the COVID-19 declaration under Section 564(b)(1) of the Act, 21 U.S.C. section 360bbb-3(b)(1), unless the authorization is terminated or revoked.  Performed at [facility], [address] [place], [place] [address]   Blood culture (single)     Status: None (Preliminary result)   Collection Time: 12/18/22 12:38 AM   Specimen: BLOOD  Result Value Ref Range Status   Specimen Description BLOOD  RIGHT FOREARM  Final   Special Requests   Final    BOTTLES DRAWN AEROBIC AND ANAEROBIC Blood Culture adequate volume   Culture   Final    NO GROWTH 1 DAY Performed at [facility], [address] [place], [place] [address]    Report Status PENDING  Incomplete     Studies: No results found.  Scheduled Meds:  amiodarone  200 mg Oral Daily   apixaban  5 mg Oral BID   vitamin C  500 mg Oral Daily   aspirin EC  81 mg Oral Daily   [START ON 12/24/2022] atorvastatin  10 mg Oral Daily   benztropine  1 mg Oral QHS    brimonidine  1 drop Both Eyes Q12H   And   timolol  1 drop Both Eyes Q12H   calcium carbonate  500 mg of elemental calcium Oral BID WC   citalopram  20 mg Oral Daily   divalproex  1,000 mg Oral BID   donepezil  10 mg Oral QHS   ferrous sulfate  325 mg Oral Q breakfast   latanoprost  1 drop Both Eyes QHS   loratadine  10 mg Oral Daily   magnesium oxide  400 mg Oral Daily   melatonin  5 mg Oral QHS   pantoprazole  40 mg Oral Daily   risperidone  4 mg Oral QHS   sodium chloride  1 spray Each Nare Daily   zinc sulfate  220 mg Oral Daily    Continuous Infusions:  cefTRIAXone (ROCEPHIN)  IV Stopped (12/18/22 2240)     LOS: 1 day   Time spent: 25 minutes  Reha Martinovich Neva Seat, MD [facility] Pager [phone]  If 7PM-7AM, please contact night-coverage [email or web address] Password [facility] 12/19/2022, 10:21 AM

## 2022-12-19 NOTE — Plan of Care (Signed)
  Problem: Education: Goal: Knowledge of risk factors and measures for prevention of condition will improve Outcome: Progressing   Problem: Coping: Goal: Psychosocial and spiritual needs will be supported Outcome: Progressing   Problem: Respiratory: Goal: Will maintain a patent airway Outcome: Progressing Goal: Complications related to the disease process, condition or treatment will be avoided or minimized Outcome: Progressing   Problem: Fluid Volume: Goal: Hemodynamic stability will improve Outcome: Progressing   Problem: Clinical Measurements: Goal: Diagnostic test results will improve Outcome: Progressing Goal: Signs and symptoms of infection will decrease Outcome: Progressing   Problem: Respiratory: Goal: Ability to maintain adequate ventilation will improve Outcome: Progressing   Problem: Education: Goal: Knowledge of General Education information will improve Description: Including pain rating scale, medication(s)/side effects and non-pharmacologic comfort measures Outcome: Progressing   Problem: Health Behavior/Discharge Planning: Goal: Ability to manage health-related needs will improve Outcome: Progressing   Problem: Clinical Measurements: Goal: Ability to maintain clinical measurements within normal limits will improve Outcome: Progressing Goal: Will remain free from infection Outcome: Progressing Goal: Diagnostic test results will improve Outcome: Progressing Goal: Respiratory complications will improve Outcome: Progressing Goal: Cardiovascular complication will be avoided Outcome: Progressing   Problem: Activity: Goal: Risk for activity intolerance will decrease Outcome: Progressing   Problem: Nutrition: Goal: Adequate nutrition will be maintained Outcome: Progressing   Problem: Coping: Goal: Level of anxiety will decrease Outcome: Progressing   Problem: Elimination: Goal: Will not experience complications related to bowel motility Outcome:  Progressing Goal: Will not experience complications related to urinary retention Outcome: Progressing   Problem: Pain Managment: Goal: General experience of comfort will improve Outcome: Progressing   Problem: Safety: Goal: Ability to remain free from injury will improve Outcome: Progressing   Problem: Skin Integrity: Goal: Risk for impaired skin integrity will decrease Outcome: Progressing

## 2022-12-19 NOTE — TOC Initial Note (Signed)
Transition of Care [facility]) - Initial/Assessment Note    Patient Details  Name: Brian Dodson MRN: [id number] Date of Birth: [birth year]  Transition of [facility]) CM/SW Contact:    Laurena Slimmer, RN Phone Number: 12/19/2022, 10:48 AM  Clinical Narrative:                 Spoke with Tammy at [facility] regarding patient's return. Tammy stated patient must be able to stand and pivot. Patient can return with positive COVID status.        Patient Goals and CMS Choice            Expected Discharge Plan and Services                                              Prior Living Arrangements/Services                       Activities of Daily Living      Permission Sought/Granted                  Emotional Assessment              Admission diagnosis:  AKI (acute kidney injury) ([place]) [N17.9] COVID-19 [U07.1] Patient Active Problem List   Diagnosis Date Noted   AKI (acute kidney injury) ([place]) 12/18/2022   Sepsis due to gram-negative UTI ([place]) 12/18/2022   COVID-19 virus infection 12/18/2022   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[place]) 12/18/2022   Cellulitis of left lower extremity 06/14/2022   COPD (chronic obstructive pulmonary disease) (HCC)    CKD (chronic kidney disease) stage 2, GFR 60-89 ml/min    HLD (hyperlipidemia)    Type II diabetes mellitus with renal manifestations ([place])    Depression    Cellulitis and abscess of left leg 02/12/2022   Streptococcal bacteremia 09/18/2021   Acute renal failure superimposed on stage 2 chronic kidney disease ([place]) 09/17/2021   Sepsis secondary to UTI ([place]) 23/53/6144   Acute metabolic encephalopathy 31/54/0086   Dementia ([place]) 03/05/2021   Memory loss due to medical condition 03/05/2021   PAF (paroxysmal atrial fibrillation) ([place]) 03/05/2021   Atrial flutter ([place]) 03/05/2021   Diabetes mellitus type 2, controlled, without complications ([place]) 76/19/5093   Obese 03/05/2021   Anemia of chronic  disease 03/04/2021   Acute lower UTI 03/02/2021   Acute febrile illness    Bilateral lower extremity edema 04/01/2018   Lower extremity pain, bilateral 04/01/2018   Swelling of limb 10/04/2017   Sepsis ([place]) 05/20/2017   Anemia 05/07/2017   B12 deficiency 05/07/2017   Loss of memory 05/07/2017   Paroxysmal atrial fibrillation with RVR ([place]) 05/23/2015   Syncope 05/23/2015   Schizophrenia ([place]) 05/23/2015   Diabetes mellitus, type II ([place]) 05/23/2015   Essential hypertension 05/23/2015   GERD (gastroesophageal reflux disease) 05/23/2015   CAD (coronary artery disease) 05/23/2015   PCP:  [name], MD Pharmacy:   [facility] - [place], [place] - [address] [place] [address] Phone: [phone] Fax: [phone]  [facility] [place], [place] - [place] [address] Phone: [phone] Fax: [phone]  [facility] [place], [place] - [address] [place] [address] Phone: [phone] Fax: [phone]  Social Determinants of Health (SDOH) Social History: SDOH Screenings   Tobacco Use: Medium Risk (06/14/2022)   SDOH Interventions:     Readmission Risk Interventions    02/14/2022    1:19 PM 09/19/2021    3:16 PM  Readmission Risk Prevention Plan  Transportation Screening Complete Complete  PCP or Specialist Appt within 3-5 Days  Complete  HRI or [place]  Complete  Social Work Consult for [place] Planning/Counseling  Complete  Palliative Care Screening  Not Applicable  Medication Review Press photographer)  Complete  HRI or [place] Complete   SW Recovery Care/Counseling Consult Complete   Palliative Care Screening Not [place] Not Applicable

## 2022-12-20 DIAGNOSIS — N179 Acute kidney failure, unspecified: Secondary | ICD-10-CM | POA: Diagnosis not present

## 2022-12-20 LAB — COMPREHENSIVE METABOLIC PANEL
ALT: 15 U/L (ref 0–44)
AST: 30 U/L (ref 15–41)
Albumin: 2.8 g/dL — ABNORMAL LOW (ref 3.5–5.0)
Alkaline Phosphatase: 38 U/L (ref 38–126)
Anion gap: 7 (ref 5–15)
BUN: 20 mg/dL (ref 8–23)
CO2: 27 mmol/L (ref 22–32)
Calcium: 8.7 mg/dL — ABNORMAL LOW (ref 8.9–10.3)
Chloride: 106 mmol/L (ref 98–111)
Creatinine, Ser: 0.95 mg/dL (ref 0.61–1.24)
GFR, Estimated: >60 mL/min (ref >60)
Glucose, Bld: 86 mg/dL (ref 70–99)
Potassium: 4.5 mmol/L (ref 3.5–5.1)
Sodium: 140 mmol/L (ref 135–145)
Total Bilirubin: 0.5 mg/dL (ref 0.3–1.2)
Total Protein: 6.3 g/dL — ABNORMAL LOW (ref 6.5–8.1)

## 2022-12-20 LAB — URINE CULTURE: Culture: >=100000 — AB

## 2022-12-20 LAB — CBC WITH DIFFERENTIAL/PLATELET
Abs Immature Granulocytes: 0.08 10*3/uL — ABNORMAL HIGH (ref 0.00–0.07)
Basophils Absolute: 0 10*3/uL (ref 0.0–0.1)
Basophils Relative: 0 %
Eosinophils Absolute: 0.2 10*3/uL (ref 0.0–0.5)
Eosinophils Relative: 4 %
HCT: 33.5 % — ABNORMAL LOW (ref 39.0–52.0)
Hemoglobin: 10.7 g/dL — ABNORMAL LOW (ref 13.0–17.0)
Immature Granulocytes: 1 %
Lymphocytes Relative: 33 %
Lymphs Abs: 2 10*3/uL (ref 0.7–4.0)
MCH: 32 pg (ref 26.0–34.0)
MCHC: 31.9 g/dL (ref 30.0–36.0)
MCV: 100.3 fL — ABNORMAL HIGH (ref 80.0–100.0)
Monocytes Absolute: 0.7 10*3/uL (ref 0.1–1.0)
Monocytes Relative: 11 %
Neutro Abs: 3.1 10*3/uL (ref 1.7–7.7)
Neutrophils Relative %: 51 %
Platelets: 158 10*3/uL (ref 150–400)
RBC: 3.34 MIL/uL — ABNORMAL LOW (ref 4.22–5.81)
RDW: 13.6 % (ref 11.5–15.5)
WBC: 6.1 10*3/uL (ref 4.0–10.5)
nRBC: 0 % (ref 0.0–0.2)

## 2022-12-20 MED ORDER — ATORVASTATIN CALCIUM 10 MG PO TABS: 10.0000 mg | ORAL_TABLET | Freq: Every day | ORAL | Status: DC

## 2022-12-20 MED ORDER — NITROFURANTOIN MONOHYD MACRO 100 MG PO CAPS: 100.0000 mg | ORAL_CAPSULE | Freq: Two times a day (BID) | ORAL | Status: DC

## 2022-12-20 MED ORDER — NITROFURANTOIN MONOHYD MACRO 100 MG PO CAPS: 100.0000 mg | ORAL_CAPSULE | Freq: Two times a day (BID) | ORAL | 0 refills | Status: AC

## 2022-12-20 MED ORDER — NITROFURANTOIN MONOHYD MACRO 100 MG PO CAPS: 100.0000 mg | ORAL_CAPSULE | Freq: Two times a day (BID) | ORAL | 0 refills | Status: DC

## 2022-12-20 NOTE — Discharge Summary (Signed)
Discharge Summary  Brian Dodson HCW:[id number] DOB: [birth year]  PCP: [name], MD  Admit date: 12/17/2022 Discharge date: 12/20/2022  Recommendations for Outpatient Follow-up:  Please follow up with your PCP with CBC and BMP in 1-2 weeks  Discharge Diagnoses:  Active Hospital Problems   Diagnosis Date Noted   AKI (acute kidney injury) ([place]) 12/18/2022    Priority: 1.   Sepsis due to gram-negative UTI ([place]) 12/18/2022    Priority: 2.   COVID-19 virus infection 12/18/2022    Priority: 3.   Paroxysmal atrial fibrillation with RVR ([place]) 05/23/2015    Priority: 5.   Essential hypertension 05/23/2015    Priority: 6.   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[place]) 12/18/2022   Diabetes mellitus type 2, controlled, without complications ([place]) 17/61/6073    Resolved Hospital Problems  No resolved problems to display.   Discharge Condition: Stable   Diet recommendation: Diet Orders (From admission, onward)     Start     Ordered   12/18/22 0229  Diet Heart Room service appropriate? Yes; Fluid consistency: Thin  Diet effective now       Question Answer Comment  Room service appropriate? Yes   Fluid consistency: Thin      12/18/22 0231           HPI and Brief Hospital Course:  67 year old male with a history of atrial fibrillation, schizophrenia, vascular dementia who lives full-time at an assisted living facility presented with acute onset of back pain.  He was found to have UTI, also incidentally found to have COVID infection.  Initially was started on Paxlovid for the COVID positivity, however this was discontinued due to interaction with amiodarone.  He has been asymptomatic and stable on room air.  He was admitted with sepsis due to [facility] negative for UTI, he was treated empirically with IV Rocephin, urine cultures show sensitive Klebsiella and patient will be discharged to complete a total 7-day course of nitrofurantoin.  Patient has been stable without complaints  since admission, he will work with PT today to ensure that he can stand for transfers and pivot, once this is ensured, he will return back to his ALF today.  Discharge details, plan of care and follow up instructions were discussed with patient and any available family or care providers. Patient and family are in agreement with discharge from the hospital today and all questions were answered to their satisfaction.  Discharge Exam: BP 106/66 (BP Location: Right Arm)   Pulse (!) 51   Temp 98.5 F (36.9 C) (Oral)   Resp 16   Ht 6\' 4"  (1.93 m)   Wt 104.9 kg   SpO2 99%   BMI 28.15 kg/m  General:  Alert, oriented, calm, in no acute distress, pleasant and cooperative resting on room air this morning Eyes: EOMI, clear sclerea Neck: supple, no masses, trachea mildline  Cardiovascular: RRR, no murmurs or rubs, no peripheral edema  Respiratory: clear to auscultation bilaterally, no wheezes, no crackles  Abdomen: soft, nontender, nondistended, normal bowel tones heard  Skin: dry, no rashes  Musculoskeletal: no joint effusions, normal range of motion  Psychiatric: appropriate affect, normal speech  Neurologic: extraocular muscles intact, clear speech, moving all extremities with intact sensorium   Discharge Instructions You were cared for by a hospitalist during your hospital stay. If you have any questions about your discharge medications or the care you received while you were in the hospital after you are discharged, you can call the unit and asked to  speak with the hospitalist on call if the hospitalist that took care of you is not available. Once you are discharged, your primary care physician will handle any further medical issues. Please note that NO REFILLS for any discharge medications will be authorized once you are discharged, as it is imperative that you return to your primary care physician (or establish a relationship with a primary care physician if you do not have one) for your  aftercare needs so that they can reassess your need for medications and monitor your lab values.   Allergies as of 12/20/2022       Reactions   Sulfa Antibiotics Other (See Comments)   Unknown reaction   Xarelto [rivaroxaban]         Medication List     STOP taking these medications    carvedilol 12.5 MG tablet Commonly known as: COREG   diclofenac Sodium 1 % Gel Commonly known as: VOLTAREN   Difluprednate 0.05 % Emul   furosemide 20 MG tablet Commonly known as: LASIX   Ilevro 0.3 % ophthalmic suspension Generic drug: nepafenac       TAKE these medications    acetaminophen 500 MG tablet Commonly known as: TYLENOL Take 1,000 mg by mouth every 6 (six) hours as needed for mild pain.   amiodarone 200 MG tablet Commonly known as: PACERONE Take 1 tablet (200 mg total) by mouth daily.   aspirin 81 MG tablet Take 81 mg by mouth daily.   atorvastatin 10 MG tablet Commonly known as: LIPITOR Take 10 mg by mouth daily.   benztropine 1 MG tablet Commonly known as: COGENTIN Take 1 mg by mouth at bedtime.   Biofreeze 4 % Gel Generic drug: Menthol (Topical Analgesic) Apply 1 application topically 3 (three) times daily. To lower back area as needed for pain   brimonidine-timolol 0.2-0.5 % ophthalmic solution Commonly known as: COMBIGAN Place 1 drop into both eyes every 12 (twelve) hours. Wait 3 to 5 minutes between drops   citalopram 20 MG tablet Commonly known as: CELEXA Take 20 mg by mouth daily.   divalproex 500 MG 24 hr tablet Commonly known as: DEPAKOTE ER Take 2 tablets (1,000 mg total) by mouth 2 (two) times daily.   donepezil 10 MG tablet Commonly known as: ARICEPT Take 10 mg by mouth at bedtime.   Eliquis 5 MG Tabs tablet Generic drug: apixaban Take 5 mg by mouth 2 (two) times daily.   ferrous sulfate 325 (65 FE) MG tablet Take 1 tablet (325 mg total) by mouth daily with breakfast. (Hold for 1 week, resume on 06/25/22)   lanolin/mineral oil  Lotn Apply 1 application topically as needed for dry skin. Apply to face after washing face daily   leptospermum manuka honey Pste paste Apply 1 Application topically daily.   loratadine 10 MG tablet Commonly known as: CLARITIN Take 10 mg by mouth daily.   Lumigan 0.01 % Soln Generic drug: bimatoprost Place 1 drop into both eyes at bedtime.   Magnesium Oxide 400 MG Caps Take 1 capsule (400 mg total) by mouth daily. (Hold for 1 week, resume on 06/25/22)   melatonin 5 MG Tabs Take 5 mg by mouth at bedtime.   metFORMIN 500 MG tablet Commonly known as: GLUCOPHAGE Take 500 mg by mouth 2 (two) times daily with a meal.   nitrofurantoin (macrocrystal-monohydrate) 100 MG capsule Commonly known as: MACROBID Take 1 capsule (100 mg total) by mouth every 12 (twelve) hours for 7 days.   omeprazole 20 MG  capsule Commonly known as: PRILOSEC Take 20 mg by mouth 2 (two) times daily before a meal. May open and sprinkle in applesauce.   oyster calcium 500 MG Tabs tablet Take 500 mg of elemental calcium by mouth 2 (two) times daily.   risperidone 4 MG tablet Commonly known as: RISPERDAL Take 4 mg by mouth at bedtime.   sodium chloride 0.65 % Soln nasal spray Commonly known as: OCEAN Place 1 spray into both nostrils daily.   zinc oxide 20 % ointment Apply 1 Application topically 2 (two) times daily as needed for irritation (to left buttock).       Allergies  Allergen Reactions   Sulfa Antibiotics Other (See Comments)    Unknown reaction   Xarelto [Rivaroxaban]     Follow-up Information     Hague, Myrene Galas, MD Follow up in 2 week(s).   Specialty: Internal Medicine Contact information: [address] [place] [address] [phone]                 The results of significant diagnostics from this hospitalization (including imaging, microbiology, ancillary and laboratory) are listed below for reference.    Significant Diagnostic Studies: CT HEAD WO CONTRAST  ( )  Result Date: 12/18/2022 CLINICAL DATA:  Altered mental status EXAM: CT HEAD WITHOUT CONTRAST TECHNIQUE: Contiguous axial images were obtained from the base of the skull through the vertex without intravenous contrast. RADIATION DOSE REDUCTION: This exam was performed according to the departmental dose-optimization program which includes automated exposure control, adjustment of the mA and/or kV according to patient size and/or use of iterative reconstruction technique. COMPARISON:  09/17/2021 FINDINGS: Brain: No evidence of acute infarction, hemorrhage, hydrocephalus, extra-axial collection or mass lesion/mass effect. Chronic white matter ischemic changes are again noted and stable. Basal ganglia calcifications are seen. Vascular: No hyperdense vessel or unexpected calcification. Skull: Normal. Negative for fracture or focal lesion. Sinuses/Orbits: No acute finding. Other: None. IMPRESSION: Chronic ischemic changes without acute abnormality. Electronically Signed   By: Alcide Clever M.D.   On: 12/18/2022 00:25   DG Chest Portable 1 View  Result Date: 12/17/2022 CLINICAL DATA:  Back pain. EXAM: PORTABLE CHEST 1 VIEW COMPARISON:  Chest radiograph 09/23/2021. FINDINGS: Mild cardiomegaly is stable.The mediastinal contours are normal. The lungs are clear. Pulmonary vasculature is normal. No consolidation, pleural effusion, or pneumothorax. No acute osseous abnormalities are seen. IMPRESSION: Stable mild cardiomegaly. No acute chest findings. Electronically Signed   By: Narda Rutherford M.D.   On: 12/17/2022 23:32    Microbiology: Recent Results (from the past 240 hour(s))  Blood culture (single)     Status: None (Preliminary result)   Collection Time: 12/17/22 11:00 PM   Specimen: BLOOD  Result Value Ref Range Status   Specimen Description BLOOD  RIGHT FOREARM  Final   Special Requests   Final    BOTTLES DRAWN AEROBIC AND ANAEROBIC Blood Culture adequate volume   Culture   Final    NO GROWTH 3  DAYS Performed at [facility], [address] [place], [place] [address]    Report Status PENDING  Incomplete  Urine Culture (for pregnant, neutropenic or urologic patients or patients with an indwelling urinary catheter)     Status: Abnormal   Collection Time: 12/17/22 11:12 PM   Specimen: Urine, Catheterized  Result Value Ref Range Status   Specimen Description   Final    URINE, CATHETERIZED Performed at [facility], [address], [place], [place] [address]    Special Requests  Final    NONE Performed at [facility], [address] [place], [place] [address]    Culture >=100,000 COLONIES/mL KLEBSIELLA PNEUMONIAE (A)  Final   Report Status 12/20/2022 FINAL  Final   Organism ID, Bacteria KLEBSIELLA PNEUMONIAE (A)  Final      Susceptibility   Klebsiella pneumoniae - MIC*    AMPICILLIN RESISTANT Resistant     CEFAZOLIN <=4 SENSITIVE Sensitive     CEFEPIME <=0.12 SENSITIVE Sensitive     CEFTRIAXONE <=0.25 SENSITIVE Sensitive     CIPROFLOXACIN <=0.25 SENSITIVE Sensitive     GENTAMICIN <=1 SENSITIVE Sensitive     IMIPENEM <=0.25 SENSITIVE Sensitive     NITROFURANTOIN 32 SENSITIVE Sensitive     TRIMETH/SULFA <=20 SENSITIVE Sensitive     AMPICILLIN/SULBACTAM 4 SENSITIVE Sensitive     PIP/TAZO <=4 SENSITIVE Sensitive     * >=100,000 COLONIES/mL KLEBSIELLA PNEUMONIAE  Resp panel by RT-PCR (RSV, Flu A&B, Covid) Anterior Nasal Swab     Status: Abnormal   Collection Time: 12/18/22 12:38 AM   Specimen: Anterior Nasal Swab  Result Value Ref Range Status   SARS Coronavirus 2 by RT PCR POSITIVE (A) NEGATIVE Final    Comment: (NOTE) SARS-CoV-2 target nucleic acids are DETECTED.  The SARS-CoV-2 RNA is generally detectable in upper respiratory specimens during the acute phase of infection. Positive results are indicative of the presence of the identified virus, but do not rule out bacterial infection or co-infection with other pathogens not detected by  the test. Clinical correlation with patient history and other diagnostic information is necessary to determine patient infection status. The expected result is Negative.  Fact Sheet for Patients: EntrepreneurPulse.com.au  Fact Sheet for Healthcare Providers: IncredibleEmployment.be  This test is not yet approved or cleared by the [place] FDA and  has been authorized for detection and/or diagnosis of SARS-CoV-2 by FDA under an Emergency Use Authorization (EUA).  This EUA will remain in effect (meaning this test can be used) for the duration of  the COVID-19 declaration under Section 564(b)(1) of the A ct, 21 U.S.C. section 360bbb-3(b)(1), unless the authorization is terminated or revoked sooner.     Influenza A by PCR NEGATIVE NEGATIVE Final   Influenza B by PCR NEGATIVE NEGATIVE Final    Comment: (NOTE) The Xpert Xpress SARS-CoV-2/FLU/RSV plus assay is intended as an aid in the diagnosis of influenza from Nasopharyngeal swab specimens and should not be used as a sole basis for treatment. Nasal washings and aspirates are unacceptable for Xpert Xpress SARS-CoV-2/FLU/RSV testing.  Fact Sheet for Patients: EntrepreneurPulse.com.au  Fact Sheet for Healthcare Providers: IncredibleEmployment.be  This test is not yet approved or cleared by the [place] FDA and has been authorized for detection and/or diagnosis of SARS-CoV-2 by FDA under an Emergency Use Authorization (EUA). This EUA will remain in effect (meaning this test can be used) for the duration of the COVID-19 declaration under Section 564(b)(1) of the Act, 21 U.S.C. section 360bbb-3(b)(1), unless the authorization is terminated or revoked.     Resp Syncytial Virus by PCR NEGATIVE NEGATIVE Final    Comment: (NOTE) Fact Sheet for Patients: EntrepreneurPulse.com.au  Fact Sheet for Healthcare  Providers: IncredibleEmployment.be  This test is not yet approved or cleared by the [place] FDA and has been authorized for detection and/or diagnosis of SARS-CoV-2 by FDA under an Emergency Use Authorization (EUA). This EUA will remain in effect (meaning this test can be used) for the duration of the COVID-19 declaration under Section 564(b)(1) of the  Act, 21 U.S.C. section 360bbb-3(b)(1), unless the authorization is terminated or revoked.  Performed at [facility], [address] [place], [place] [address]   Blood culture (single)     Status: None (Preliminary result)   Collection Time: 12/18/22 12:38 AM   Specimen: BLOOD  Result Value Ref Range Status   Specimen Description BLOOD  RIGHT FOREARM  Final   Special Requests   Final    BOTTLES DRAWN AEROBIC AND ANAEROBIC Blood Culture adequate volume   Culture   Final    NO GROWTH 2 DAYS Performed at [facility], [address] [place], [place] [address]    Report Status PENDING  Incomplete  MRSA Next Gen by PCR, Nasal     Status: None   Collection Time: 12/19/22  9:15 AM   Specimen: Nasal Mucosa; Nasal Swab  Result Value Ref Range Status   MRSA by PCR Next Gen NOT DETECTED NOT DETECTED Final    Comment: (NOTE) The GeneXpert MRSA Assay (FDA approved for NASAL specimens only), is one component of a comprehensive MRSA colonization surveillance program. It is not intended to diagnose MRSA infection nor to guide or monitor treatment for MRSA infections. Test performance is not FDA approved in patients less than 8 years old. Performed at [facility], [address] [place], [place] [address]      Labs: Basic Metabolic Panel: Recent Labs  Lab 12/17/22 2300 12/19/22 0226 12/20/22 0528  NA 133* 138 140  K 4.2 4.6 4.5  CL 101 105 106  CO2 23 28 27   GLUCOSE 139* 106* 86  BUN 33* 24* 20  CREATININE 1.57* 1.06 0.95  CALCIUM 8.5* 8.4* 8.7*   Liver Function  Tests: Recent Labs  Lab 12/17/22 2300 12/19/22 0226 12/20/22 0528  AST 25 29 30   ALT 11 11 15   ALKPHOS 49 36* 38  BILITOT 0.8 0.6 0.5  PROT 7.3 5.9* 6.3*  ALBUMIN 3.4* 2.7* 2.8*   No results for input(s): "LIPASE", "AMYLASE" in the last 168 hours. No results for input(s): "AMMONIA" in the last 168 hours. CBC: Recent Labs  Lab 12/17/22 2300 12/19/22 0226 12/20/22 0528  WBC 13.4* 9.4 6.1  NEUTROABS 9.7* 5.6 3.1  HGB 10.8* 9.7* 10.7*  HCT 33.4* 30.3* 33.5*  MCV 100.0 101.3* 100.3*  PLT 166 152 158   Cardiac Enzymes: No results for input(s): "CKTOTAL", "CKMB", "CKMBINDEX", "TROPONINI" in the last 168 hours. BNP: BNP (last 3 results) No results for input(s): "BNP" in the last 8760 hours.  ProBNP (last 3 results) No results for input(s): "PROBNP" in the last 8760 hours.  CBG: No results for input(s): "GLUCAP" in the last 168 hours.  Time spent: > 30 minutes were spent in preparing this discharge including medication reconciliation, counseling, and coordination of care.  Signed:  Nessa Ramaker Neva Seat, MD  [facility] 12/20/2022, 9:37 AM'

## 2022-12-20 NOTE — TOC Progression Note (Signed)
Transition of Care [facility]) - Progression Note    Patient Details  Name: Brian Dodson MRN: [id number] Date of Birth: [birth year]  [facility] CM/SW Contact  Laurena Slimmer, RN Phone Number: 12/20/2022, 11:04 AM  Clinical Narrative:    Spoke with Tammy in admissions at facility @ [phone] to advised of transfer back to facility today.  FL2 and discharge summary sent to [facility] will provided transportation at 12:30 Contacted to Toula Moos at [place] @ [phone]. Kailee requested FL2. FL2 faxed to [phone].          Expected Discharge Plan and Services         Expected Discharge Date: 12/20/22                                     Social Determinants of Health (SDOH) Interventions SDOH Screenings   Tobacco Use: Medium Risk (06/14/2022)    Readmission Risk Interventions    02/14/2022    1:19 PM 09/19/2021    3:16 PM  Readmission Risk Prevention Plan  Transportation Screening Complete Complete  PCP or Specialist Appt within 3-5 Days  Complete  HRI or [place]  Complete  Social Work Consult for [place] Planning/Counseling  Complete  Palliative Care Screening  Not Applicable  Medication Review [employer])  Complete  HRI or [place] Complete   SW Recovery Care/Counseling Consult Complete   Palliative Care Screening Not [name] Not Applicable

## 2022-12-20 NOTE — Evaluation (Signed)
Physical Therapy Evaluation Patient Details Name: Brian Dodson MRN: [id number] DOB: August 10, 1956 Today's Date: 12/20/2022  History of Present Illness  Brian Dodson is a 67 y.o. African-American male with medical history significant for atrial fibrillation, BPH, coronary artery disease, COPD, type diabetes mellitus, hypertension, dyslipidemia, schizophrenia and vascular dementia, who presented to the emergency room with acute onset of low back pain at his SNF which she denied in the ER.  He was noted to have fever, tachycardia and tachypnea.  He admits to urinary frequency and dysuria as well as fever and chills. Was found to have COVID infection and AFib RVR, he is on room air and he is currently in NSR.   Clinical Impression  Patient is very pleasant. Agrees to PT assessment. He performed bed mobility with mod I, transfers with supervision to min guard. Ambulated 25 feet with single hand held assist, then no Ue support. No lob. Appears to be at baseline. He does not require skilled PT follow up at this time. Signing off.           Recommendations for follow up therapy are one component of a multi-disciplinary discharge planning process, led by the attending physician.  Recommendations may be updated based on patient status, additional functional criteria and insurance authorization.  Follow Up Recommendations No PT follow up      Assistance Recommended at Discharge Intermittent Supervision/Assistance  Patient can return home with the following  Help with stairs or ramp for entrance;Assist for transportation    Equipment Recommendations None recommended by PT  Recommendations for Other Services       Functional Status Assessment Patient has not had a recent decline in their functional status     Precautions / Restrictions Precautions Precautions: Fall Restrictions Weight Bearing Restrictions: No      Mobility  Bed Mobility Overal bed mobility: Modified Independent              General bed mobility comments: increased time but no assist needed    Transfers Overall transfer level: Needs assistance Equipment used: None Transfers: Sit to/from Stand Sit to Stand: Supervision                Ambulation/Gait Ambulation/Gait assistance: Supervision Gait Distance (Feet): 25 Feet Assistive device: None, 1 person hand held assist Gait Pattern/deviations: Step-through pattern, Decreased step length - right, Decreased step length - left, Trunk flexed Gait velocity: slightly decreased     General Gait Details: no lob or difficulties reported, he appears to be at baseline.  Stairs            Wheelchair Mobility    Modified Rankin (Stroke Patients Only)       Balance Overall balance assessment: Modified Independent                                           Pertinent Vitals/Pain Pain Assessment Pain Assessment: No/denies pain    Home Living Family/patient expects to be discharged to:: Assisted living                 Home Equipment: None      Prior Function Prior Level of Function : Independent/Modified Independent             Mobility Comments: independent prior ADLs Comments: supervision/ assist as needed from ALF     Hand Dominance   Dominant Hand: Right  Extremity/Trunk Assessment   Upper Extremity Assessment Upper Extremity Assessment: Overall WFL for tasks assessed    Lower Extremity Assessment Lower Extremity Assessment: Overall WFL for tasks assessed    Cervical / Trunk Assessment Cervical / Trunk Assessment: Normal  Communication   Communication: No difficulties  Cognition Arousal/Alertness: Awake/alert Behavior During Therapy: WFL for tasks assessed/performed Overall Cognitive Status: History of cognitive impairments - at baseline                                          General Comments      Exercises     Assessment/Plan    PT Assessment Patient does  not need any further PT services  PT Problem List         PT Treatment Interventions      PT Goals (Current goals can be found in the Care Plan section)  Acute Rehab PT Goals Patient Stated Goal: to return home PT Goal Formulation: With patient Time For Goal Achievement: 12/21/22 Potential to Achieve Goals: Good    Frequency       Co-evaluation               AM-PAC PT "6 Clicks" Mobility  Outcome Measure Help needed turning from your back to your side while in a flat bed without using bedrails?: None Help needed moving from lying on your back to sitting on the side of a flat bed without using bedrails?: None Help needed moving to and from a bed to a chair (including a wheelchair)?: None Help needed standing up from a chair using your arms (e.g., wheelchair or bedside chair)?: None Help needed to walk in hospital room?: None Help needed climbing 3-5 steps with a railing? : A Little 6 Click Score: 23    End of Session   Activity Tolerance: Patient tolerated treatment well Patient left: in chair;with chair alarm set;with nursing/sitter in room Nurse Communication: Mobility status      Time: 4008-6761 PT Time Calculation (min) (ACUTE ONLY): 15 min   Charges:   PT Evaluation $PT Eval Low Complexity: 1 Low          Chandra Asher, PT, GCS 12/20/22,11:19 AM

## 2022-12-20 NOTE — NC FL2 (Signed)
[place] LEVEL OF CARE FORM     IDENTIFICATION  Patient Name: Brian Dodson Birthdate: November 20, 1955 Sex: male Admission Date (Current Location): 12/17/2022  [facility] and [place] Number:  Engineering geologist and Address:  [facility], [address], [place], [place] [address]      Provider Number: [id number]  Attending Physician Name and Address:  Lucillie Garfinkel, MD  Relative Name and Phone Number:  Toula Moos [phone]    Current Level of Care: Hospital Recommended Level of Care: [place] Prior Approval Number:    Date Approved/Denied:   PASRR Number: [id number]  Discharge Plan: Other (Comment) (ALF)    Current Diagnoses: Patient Active Problem List   Diagnosis Date Noted   AKI (acute kidney injury) ([place]) 12/18/2022   Sepsis due to gram-negative UTI ([place]) 12/18/2022   COVID-19 virus infection 12/18/2022   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[place]) 12/18/2022   Cellulitis of left lower extremity 06/14/2022   COPD (chronic obstructive pulmonary disease) ([place])    CKD (chronic kidney disease) stage 2, GFR 60-89 ml/min    HLD (hyperlipidemia)    Type II diabetes mellitus with renal manifestations ([place])    Depression    Cellulitis and abscess of left leg 02/12/2022   Streptococcal bacteremia 09/18/2021   Acute renal failure superimposed on stage 2 chronic kidney disease ([place]) 09/17/2021   Sepsis secondary to UTI ([place]) 27/78/2423   Acute metabolic encephalopathy 53/61/4431   Dementia ([place]) 03/05/2021   Memory loss due to medical condition 03/05/2021   PAF (paroxysmal atrial fibrillation) ([place]) 03/05/2021   Atrial flutter ([place]) 03/05/2021   Diabetes mellitus type 2, controlled, without complications ([place]) 54/00/8676   Obese 03/05/2021   Anemia of chronic disease 03/04/2021   Acute lower UTI 03/02/2021   Acute febrile illness    Bilateral lower extremity edema 04/01/2018   Lower extremity  pain, bilateral 04/01/2018   Swelling of limb 10/04/2017   Sepsis ([place]) 05/20/2017   Anemia 05/07/2017   B12 deficiency 05/07/2017   Loss of memory 05/07/2017   Paroxysmal atrial fibrillation with RVR ([place]) 05/23/2015   Syncope 05/23/2015   Schizophrenia ([place]) 05/23/2015   Diabetes mellitus, type II ([place]) 05/23/2015   Essential hypertension 05/23/2015   GERD (gastroesophageal reflux disease) 05/23/2015   CAD (coronary artery disease) 05/23/2015    Orientation RESPIRATION BLADDER Height & Weight     Self  Normal External catheter Weight: 104.9 kg Height:  6\' 4"  (193 cm)  BEHAVIORAL SYMPTOMS/MOOD NEUROLOGICAL BOWEL NUTRITION STATUS  Other (Comment) (n/a)  (n/a) Continent Diet (Heart Health)  AMBULATORY STATUS COMMUNICATION OF NEEDS Skin   Limited Assist Verbally Skin abrasions (R knee)                       Personal Care Assistance Level of Assistance  Bathing, Dressing Bathing Assistance: Limited assistance   Dressing Assistance: Limited assistance     Functional Limitations Info             SPECIAL CARE FACTORS FREQUENCY                       Contractures Contractures Info: Not present    Additional Factors Info  Code Status, Allergies Code Status Info: FULL Allergies Info: Sulfa Antibiotics, Xarelto (Rivaroxaban)           Current Medications (12/20/2022):  This is the current hospital active medication list TAKE these medications     acetaminophen  500 MG tablet Commonly known as: TYLENOL Take 1,000 mg by mouth every 6 (six) hours as needed for mild pain.    amiodarone 200 MG tablet Commonly known as: PACERONE Take 1 tablet (200 mg total) by mouth daily.    aspirin 81 MG tablet Take 81 mg by mouth daily.    atorvastatin 10 MG tablet Commonly known as: LIPITOR Take 10 mg by mouth daily.    benztropine 1 MG tablet Commonly known as: COGENTIN Take 1 mg by mouth at bedtime.    Biofreeze 4 % Gel Generic drug: Menthol (Topical  Analgesic) Apply 1 application topically 3 (three) times daily. To lower back area as needed for pain    brimonidine-timolol 0.2-0.5 % ophthalmic solution Commonly known as: COMBIGAN Place 1 drop into both eyes every 12 (twelve) hours. Wait 3 to 5 minutes between drops    citalopram 20 MG tablet Commonly known as: CELEXA Take 20 mg by mouth daily.    divalproex 500 MG 24 hr tablet Commonly known as: DEPAKOTE ER Take 2 tablets (1,000 mg total) by mouth 2 (two) times daily.    donepezil 10 MG tablet Commonly known as: ARICEPT Take 10 mg by mouth at bedtime.    Eliquis 5 MG Tabs tablet Generic drug: apixaban Take 5 mg by mouth 2 (two) times daily.    ferrous sulfate 325 (65 FE) MG tablet Take 1 tablet (325 mg total) by mouth daily with breakfast. (Hold for 1 week, resume on 06/25/22)    lanolin/mineral oil Lotn Apply 1 application topically as needed for dry skin. Apply to face after washing face daily    leptospermum manuka honey Pste paste Apply 1 Application topically daily.    loratadine 10 MG tablet Commonly known as: CLARITIN Take 10 mg by mouth daily.    Lumigan 0.01 % Soln Generic drug: bimatoprost Place 1 drop into both eyes at bedtime.    Magnesium Oxide 400 MG Caps Take 1 capsule (400 mg total) by mouth daily. (Hold for 1 week, resume on 06/25/22)    melatonin 5 MG Tabs Take 5 mg by mouth at bedtime.    metFORMIN 500 MG tablet Commonly known as: GLUCOPHAGE Take 500 mg by mouth 2 (two) times daily with a meal.    nitrofurantoin (macrocrystal-monohydrate) 100 MG capsule Commonly known as: MACROBID Take 1 capsule (100 mg total) by mouth every 12 (twelve) hours for 7 days.    omeprazole 20 MG capsule Commonly known as: PRILOSEC Take 20 mg by mouth 2 (two) times daily before a meal. May open and sprinkle in applesauce.    oyster calcium 500 MG Tabs tablet Take 500 mg of elemental calcium by mouth 2 (two) times daily.    risperidone 4 MG tablet Commonly  known as: RISPERDAL Take 4 mg by mouth at bedtime.    sodium chloride 0.65 % Soln nasal spray Commonly known as: OCEAN Place 1 spray into both nostrils daily.    zinc oxide 20 % ointment Apply 1 Application topically 2 (two) times daily as needed for irritation (to left buttock).       Discharge Medications: Please see discharge summary for a list of discharge medications.  Relevant Imaging Results:  Relevant Lab Results:   Additional Information SS# [id number]  Laurena Slimmer, RN

## 2022-12-22 LAB — CULTURE, BLOOD (SINGLE)
Culture: NO GROWTH
Special Requests: ADEQUATE

## 2022-12-23 LAB — CULTURE, BLOOD (SINGLE)
Culture: NO GROWTH
Special Requests: ADEQUATE

## 2023-04-22 IMAGING — DX DG CHEST 1V PORT
1 series · 1 of 1 positions shown · non-contrast
Comparison: Radiograph and CT 09/17/2021

CLINICAL DATA: PICC placement.

EXAM:
PORTABLE CHEST 1 VIEW

[chest ap]
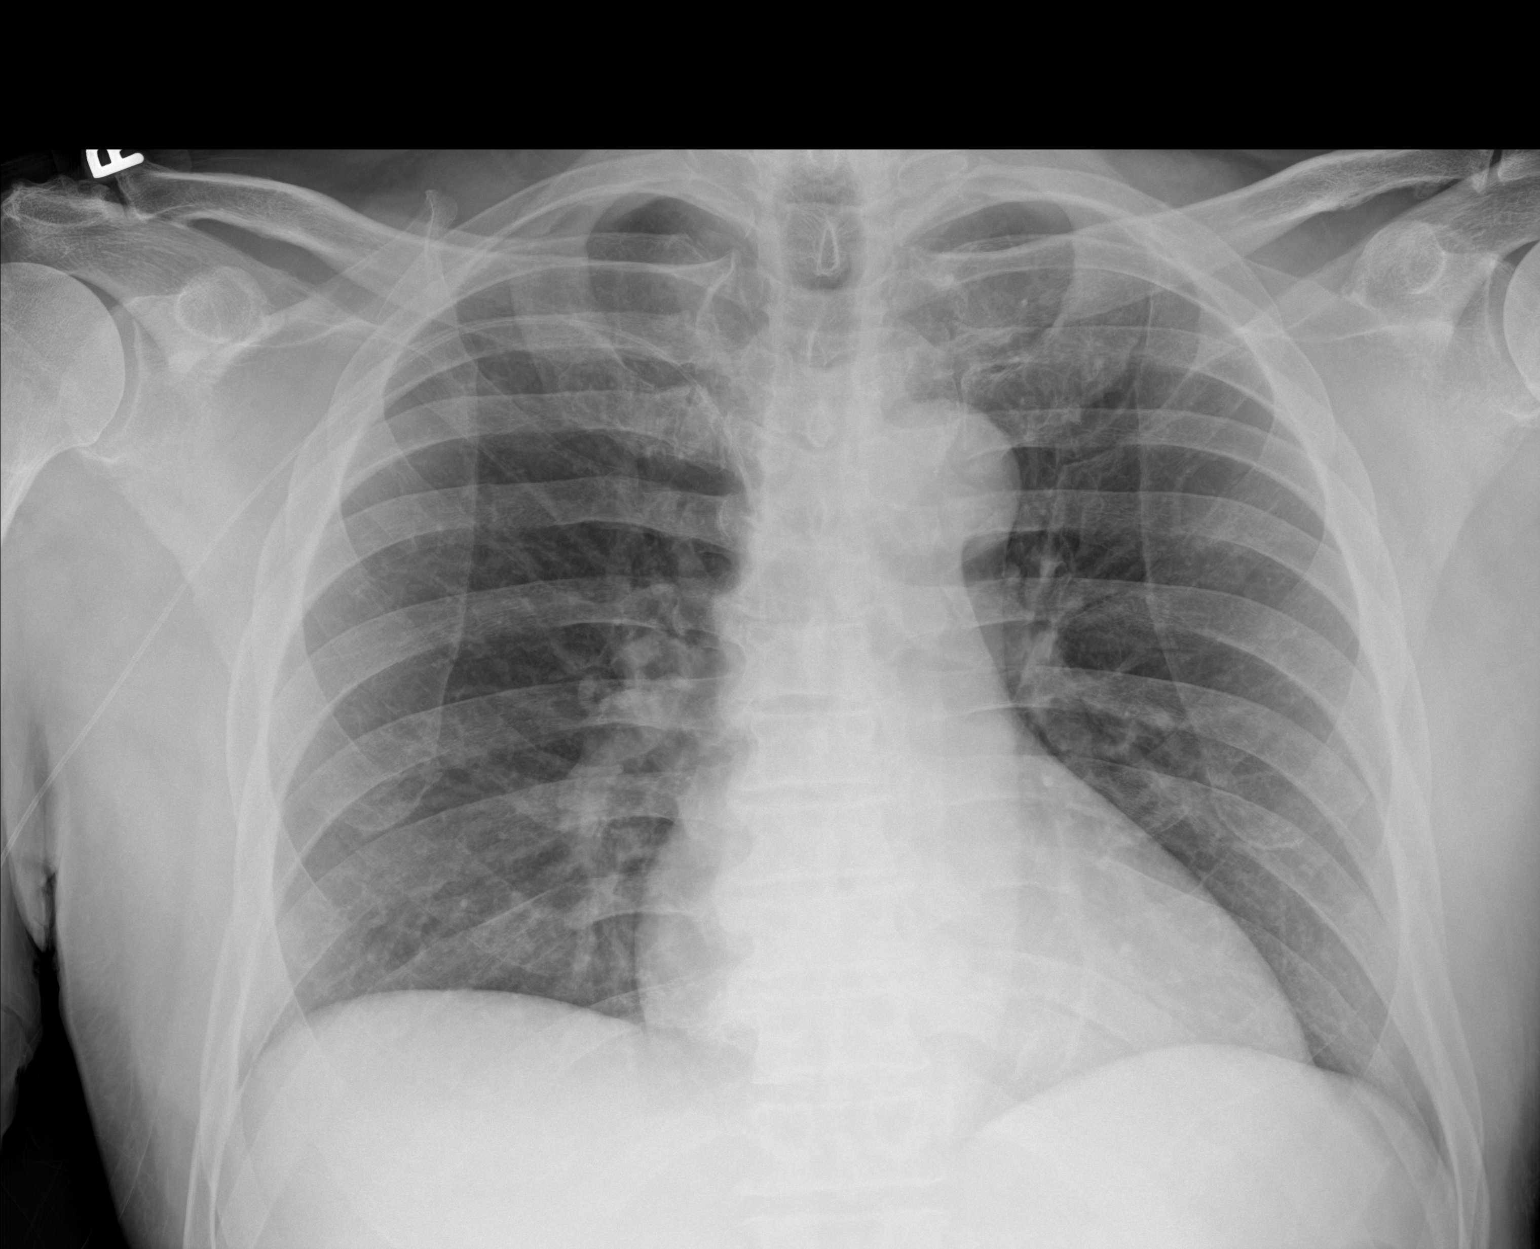

[1 of 1 positions shown; findings below may reference images not displayed]

FINDINGS: Tip of the right upper extremity PICC in the mid SVC. The
cardiomediastinal contours are stable, upper normal heart size. The
lungs are clear. Pulmonary vasculature is normal. No consolidation,
pleural effusion, or pneumothorax. No acute osseous abnormalities
are seen.
IMPRESSION: Tip of the right upper extremity PICC in the mid SVC.

## 2023-05-14 ENCOUNTER — Emergency Department: Payer: Medicare Other

## 2023-05-14 ENCOUNTER — Other Ambulatory Visit: Payer: Self-pay

## 2023-05-14 ENCOUNTER — Observation Stay: Payer: Medicare Other

## 2023-05-14 ENCOUNTER — Observation Stay
Admission: EM | Admit: 2023-05-14 | Discharge: 2023-05-15 | Disposition: A | Discharge status: Skilled Nursing Facility | Payer: Medicare Other | Attending: Internal Medicine | Admitting: Internal Medicine

## 2023-05-14 DIAGNOSIS — G9341 Metabolic encephalopathy: Secondary | ICD-10-CM | POA: Diagnosis not present

## 2023-05-14 DIAGNOSIS — I129 Hypertensive chronic kidney disease with stage 1 through stage 4 chronic kidney disease, or unspecified chronic kidney disease: Secondary | ICD-10-CM | POA: Insufficient documentation

## 2023-05-14 DIAGNOSIS — Z79899 Other long term (current) drug therapy: Secondary | ICD-10-CM | POA: Diagnosis not present

## 2023-05-14 DIAGNOSIS — I480 Paroxysmal atrial fibrillation: Secondary | ICD-10-CM | POA: Insufficient documentation

## 2023-05-14 DIAGNOSIS — J45909 Unspecified asthma, uncomplicated: Secondary | ICD-10-CM | POA: Insufficient documentation

## 2023-05-14 DIAGNOSIS — N3000 Acute cystitis without hematuria: Secondary | ICD-10-CM | POA: Diagnosis not present

## 2023-05-14 DIAGNOSIS — Z1152 Encounter for screening for COVID-19: Secondary | ICD-10-CM | POA: Insufficient documentation

## 2023-05-14 DIAGNOSIS — I2510 Atherosclerotic heart disease of native coronary artery without angina pectoris: Secondary | ICD-10-CM | POA: Diagnosis not present

## 2023-05-14 DIAGNOSIS — Z955 Presence of coronary angioplasty implant and graft: Secondary | ICD-10-CM | POA: Insufficient documentation

## 2023-05-14 DIAGNOSIS — Z7982 Long term (current) use of aspirin: Secondary | ICD-10-CM | POA: Insufficient documentation

## 2023-05-14 DIAGNOSIS — N390 Urinary tract infection, site not specified: Secondary | ICD-10-CM | POA: Insufficient documentation

## 2023-05-14 DIAGNOSIS — E785 Hyperlipidemia, unspecified: Secondary | ICD-10-CM | POA: Diagnosis present

## 2023-05-14 DIAGNOSIS — N182 Chronic kidney disease, stage 2 (mild): Secondary | ICD-10-CM | POA: Diagnosis not present

## 2023-05-14 DIAGNOSIS — F209 Schizophrenia, unspecified: Secondary | ICD-10-CM | POA: Diagnosis present

## 2023-05-14 DIAGNOSIS — Z87891 Personal history of nicotine dependence: Secondary | ICD-10-CM | POA: Diagnosis not present

## 2023-05-14 DIAGNOSIS — R531 Weakness: Secondary | ICD-10-CM | POA: Diagnosis present

## 2023-05-14 DIAGNOSIS — G9340 Encephalopathy, unspecified: Secondary | ICD-10-CM

## 2023-05-14 DIAGNOSIS — E1122 Type 2 diabetes mellitus with diabetic chronic kidney disease: Secondary | ICD-10-CM | POA: Diagnosis not present

## 2023-05-14 DIAGNOSIS — F0390 Unspecified dementia without behavioral disturbance: Secondary | ICD-10-CM | POA: Diagnosis not present

## 2023-05-14 DIAGNOSIS — E1129 Type 2 diabetes mellitus with other diabetic kidney complication: Secondary | ICD-10-CM | POA: Diagnosis present

## 2023-05-14 DIAGNOSIS — Z7984 Long term (current) use of oral hypoglycemic drugs: Secondary | ICD-10-CM | POA: Diagnosis not present

## 2023-05-14 DIAGNOSIS — F32A Depression, unspecified: Secondary | ICD-10-CM | POA: Diagnosis present

## 2023-05-14 LAB — COMPREHENSIVE METABOLIC PANEL
ALT: 13 U/L (ref 0–44)
AST: 22 U/L (ref 15–41)
Albumin: 3.4 g/dL — ABNORMAL LOW (ref 3.5–5.0)
Alkaline Phosphatase: 39 U/L (ref 38–126)
Anion gap: 7 (ref 5–15)
BUN: 25 mg/dL — ABNORMAL HIGH (ref 8–23)
CO2: 25 mmol/L (ref 22–32)
Calcium: 8.9 mg/dL (ref 8.9–10.3)
Chloride: 104 mmol/L (ref 98–111)
Creatinine, Ser: 1.19 mg/dL (ref 0.61–1.24)
GFR, Estimated: >60 mL/min (ref >60)
Glucose, Bld: 147 mg/dL — ABNORMAL HIGH (ref 70–99)
Potassium: 4.2 mmol/L (ref 3.5–5.1)
Sodium: 136 mmol/L (ref 135–145)
Total Bilirubin: 0.6 mg/dL (ref 0.3–1.2)
Total Protein: 6.8 g/dL (ref 6.5–8.1)

## 2023-05-14 LAB — ETHANOL: Alcohol, Ethyl (B): <10 mg/dL (ref <10)

## 2023-05-14 LAB — CBC
HCT: 34 % — ABNORMAL LOW (ref 39.0–52.0)
Hemoglobin: 10.9 g/dL — ABNORMAL LOW (ref 13.0–17.0)
MCH: 32 pg (ref 26.0–34.0)
MCHC: 32.1 g/dL (ref 30.0–36.0)
MCV: 99.7 fL (ref 80.0–100.0)
Platelets: 146 10*3/uL — ABNORMAL LOW (ref 150–400)
RBC: 3.41 MIL/uL — ABNORMAL LOW (ref 4.22–5.81)
RDW: 14.5 % (ref 11.5–15.5)
WBC: 11.7 10*3/uL — ABNORMAL HIGH (ref 4.0–10.5)
nRBC: 0 % (ref 0.0–0.2)

## 2023-05-14 LAB — URINALYSIS, ROUTINE W REFLEX MICROSCOPIC
Bilirubin Urine: NEGATIVE
Glucose, UA: NEGATIVE mg/dL
Hgb urine dipstick: NEGATIVE
Ketones, ur: NEGATIVE mg/dL
Nitrite: POSITIVE — AB
Protein, ur: 30 mg/dL — AB
Specific Gravity, Urine: 1.039 — ABNORMAL HIGH (ref 1.005–1.030)
WBC, UA: >50 WBC/hpf (ref 0–5)
pH: 6 (ref 5.0–8.0)

## 2023-05-14 LAB — DIFFERENTIAL
Abs Immature Granulocytes: 0.06 10*3/uL (ref 0.00–0.07)
Basophils Absolute: 0 10*3/uL (ref 0.0–0.1)
Basophils Relative: 0 %
Eosinophils Absolute: 0 10*3/uL (ref 0.0–0.5)
Eosinophils Relative: 0 %
Immature Granulocytes: 1 %
Lymphocytes Relative: 17 %
Lymphs Abs: 1.9 10*3/uL (ref 0.7–4.0)
Monocytes Absolute: 1.1 10*3/uL — ABNORMAL HIGH (ref 0.1–1.0)
Monocytes Relative: 10 %
Neutro Abs: 8.5 10*3/uL — ABNORMAL HIGH (ref 1.7–7.7)
Neutrophils Relative %: 72 %

## 2023-05-14 LAB — GLUCOSE, CAPILLARY: Glucose-Capillary: 72 mg/dL (ref 70–99)

## 2023-05-14 LAB — PROTIME-INR
INR: 1.2 (ref 0.8–1.2)
Prothrombin Time: 15.1 seconds (ref 11.4–15.2)

## 2023-05-14 LAB — SARS CORONAVIRUS 2 BY RT PCR: SARS Coronavirus 2 by RT PCR: NEGATIVE

## 2023-05-14 LAB — APTT: aPTT: 34 seconds (ref 24–36)

## 2023-05-14 MED ORDER — INSULIN ASPART 100 UNIT/ML IJ SOLN: 0.0000 [IU] | Freq: Three times a day (TID) | INTRAMUSCULAR | Status: DC

## 2023-05-14 MED ORDER — PANTOPRAZOLE SODIUM 40 MG PO TBEC
40.0000 mg | DELAYED_RELEASE_TABLET | Freq: Every day | ORAL | Status: DC
  Administered 2023-05-15: 40 mg via ORAL
  Filled 2023-05-14: qty 1

## 2023-05-14 MED ORDER — CITALOPRAM HYDROBROMIDE 20 MG PO TABS
20.0000 mg | ORAL_TABLET | Freq: Every day | ORAL | Status: DC
  Administered 2023-05-15: 20 mg via ORAL
  Filled 2023-05-14: qty 1

## 2023-05-14 MED ORDER — SENNOSIDES-DOCUSATE SODIUM 8.6-50 MG PO TABS: 1.0000 | ORAL_TABLET | Freq: Every evening | ORAL | Status: DC | PRN

## 2023-05-14 MED ORDER — STROKE: EARLY STAGES OF RECOVERY BOOK: Freq: Once | Status: AC

## 2023-05-14 MED ORDER — BRIMONIDINE TARTRATE 0.2 % OP SOLN
1.0000 [drp] | Freq: Two times a day (BID) | OPHTHALMIC | Status: DC
  Administered 2023-05-14 – 2023-05-15 (×2): 1 [drp] via OPHTHALMIC
  Filled 2023-05-14: qty 5

## 2023-05-14 MED ORDER — SODIUM CHLORIDE 0.9% FLUSH: 3.0000 mL | Freq: Once | INTRAVENOUS | Status: DC

## 2023-05-14 MED ORDER — IOHEXOL 350 MG/ML SOLN
50.0000 mL | Freq: Once | INTRAVENOUS | Status: AC | PRN
  Administered 2023-05-14: 50 mL via INTRAVENOUS

## 2023-05-14 MED ORDER — LACTATED RINGERS IV BOLUS
1000.0000 mL | Freq: Once | INTRAVENOUS | Status: AC
  Administered 2023-05-14: 1000 mL via INTRAVENOUS

## 2023-05-14 MED ORDER — CALCIUM CARBONATE 1250 (500 CA) MG PO TABS
500.0000 mg | ORAL_TABLET | Freq: Two times a day (BID) | ORAL | Status: DC
  Administered 2023-05-15: 1250 mg via ORAL
  Filled 2023-05-14 (×2): qty 1

## 2023-05-14 MED ORDER — TIMOLOL MALEATE 0.5 % OP SOLN
1.0000 [drp] | Freq: Two times a day (BID) | OPHTHALMIC | Status: DC
  Administered 2023-05-14 – 2023-05-15 (×2): 1 [drp] via OPHTHALMIC
  Filled 2023-05-14: qty 5

## 2023-05-14 MED ORDER — ATORVASTATIN CALCIUM 20 MG PO TABS
10.0000 mg | ORAL_TABLET | Freq: Every day | ORAL | Status: DC
  Administered 2023-05-15: 10 mg via ORAL
  Filled 2023-05-14: qty 1

## 2023-05-14 MED ORDER — APIXABAN 5 MG PO TABS
5.0000 mg | ORAL_TABLET | Freq: Two times a day (BID) | ORAL | Status: DC
  Administered 2023-05-14 – 2023-05-15 (×2): 5 mg via ORAL
  Filled 2023-05-14 (×2): qty 1

## 2023-05-14 MED ORDER — ACETAMINOPHEN 650 MG RE SUPP: 650.0000 mg | RECTAL | Status: DC | PRN

## 2023-05-14 MED ORDER — DONEPEZIL HCL 5 MG PO TABS
10.0000 mg | ORAL_TABLET | Freq: Every day | ORAL | Status: DC
  Administered 2023-05-14: 10 mg via ORAL
  Filled 2023-05-14: qty 2

## 2023-05-14 MED ORDER — HYDROCERIN EX CREA
1.0000 | TOPICAL_CREAM | Freq: Every morning | CUTANEOUS | Status: DC
  Filled 2023-05-14: qty 113

## 2023-05-14 MED ORDER — ACETAMINOPHEN 160 MG/5ML PO SOLN
650.0000 mg | ORAL | Status: DC | PRN
  Filled 2023-05-14: qty 20.3

## 2023-05-14 MED ORDER — ACETAMINOPHEN 325 MG PO TABS: 650.0000 mg | ORAL_TABLET | ORAL | Status: DC | PRN

## 2023-05-14 MED ORDER — HYDROCERIN EX CREA: 1.0000 | TOPICAL_CREAM | CUTANEOUS | Status: DC | PRN

## 2023-05-14 MED ORDER — AMIODARONE HCL 200 MG PO TABS
200.0000 mg | ORAL_TABLET | Freq: Every day | ORAL | Status: DC
  Administered 2023-05-15: 200 mg via ORAL
  Filled 2023-05-14: qty 1

## 2023-05-14 MED ORDER — MAGNESIUM OXIDE 400 MG PO TABS
400.0000 mg | ORAL_TABLET | Freq: Every day | ORAL | Status: DC
  Administered 2023-05-15: 400 mg via ORAL
  Filled 2023-05-14 (×2): qty 1

## 2023-05-14 MED ORDER — BENZTROPINE MESYLATE 1 MG PO TABS
1.0000 mg | ORAL_TABLET | Freq: Every day | ORAL | Status: DC
  Administered 2023-05-14: 1 mg via ORAL
  Filled 2023-05-14: qty 1

## 2023-05-14 MED ORDER — LORATADINE 10 MG PO TABS
10.0000 mg | ORAL_TABLET | Freq: Every day | ORAL | Status: DC
  Administered 2023-05-15: 10 mg via ORAL
  Filled 2023-05-14: qty 1

## 2023-05-14 MED ORDER — DIVALPROEX SODIUM ER 500 MG PO TB24
1000.0000 mg | ORAL_TABLET | Freq: Two times a day (BID) | ORAL | Status: DC
  Administered 2023-05-14 – 2023-05-15 (×2): 1000 mg via ORAL
  Filled 2023-05-14 (×2): qty 2

## 2023-05-14 MED ORDER — ONDANSETRON HCL 4 MG/2ML IJ SOLN: 4.0000 mg | Freq: Three times a day (TID) | INTRAMUSCULAR | Status: DC | PRN

## 2023-05-14 MED ORDER — SALINE SPRAY 0.65 % NA SOLN
1.0000 | Freq: Every day | NASAL | Status: DC
  Filled 2023-05-14: qty 44

## 2023-05-14 MED ORDER — SODIUM CHLORIDE 0.9 % IV SOLN
2.0000 g | INTRAVENOUS | Status: DC
  Filled 2023-05-14: qty 20

## 2023-05-14 MED ORDER — SODIUM CHLORIDE 0.9 % IV SOLN
1.0000 g | Freq: Once | INTRAVENOUS | Status: AC
  Administered 2023-05-14: 1 g via INTRAVENOUS
  Filled 2023-05-14: qty 10

## 2023-05-14 MED ORDER — INSULIN ASPART 100 UNIT/ML IJ SOLN: 0.0000 [IU] | Freq: Every day | INTRAMUSCULAR | Status: DC

## 2023-05-14 MED ORDER — RISPERIDONE 1 MG PO TABS
4.0000 mg | ORAL_TABLET | Freq: Every day | ORAL | Status: DC
  Administered 2023-05-14: 4 mg via ORAL
  Filled 2023-05-14: qty 4

## 2023-05-14 MED ORDER — LATANOPROST 0.005 % OP SOLN
1.0000 [drp] | Freq: Every day | OPHTHALMIC | Status: DC
  Administered 2023-05-14: 1 [drp] via OPHTHALMIC
  Filled 2023-05-14: qty 2.5

## 2023-05-14 MED ORDER — ZINC OXIDE 20 % EX OINT: 1.0000 | TOPICAL_OINTMENT | Freq: Two times a day (BID) | CUTANEOUS | Status: DC | PRN

## 2023-05-14 MED ORDER — MELATONIN 5 MG PO TABS
5.0000 mg | ORAL_TABLET | Freq: Every day | ORAL | Status: DC
  Administered 2023-05-14: 5 mg via ORAL
  Filled 2023-05-14: qty 1

## 2023-05-14 MED ORDER — HYDRALAZINE HCL 20 MG/ML IJ SOLN: 5.0000 mg | INTRAMUSCULAR | Status: DC | PRN

## 2023-05-14 MED ORDER — DM-GUAIFENESIN ER 30-600 MG PO TB12: 1.0000 | ORAL_TABLET | Freq: Two times a day (BID) | ORAL | Status: DC | PRN

## 2023-05-14 MED ORDER — ASPIRIN 81 MG PO TBEC
81.0000 mg | DELAYED_RELEASE_TABLET | Freq: Every day | ORAL | Status: DC
  Administered 2023-05-15: 81 mg via ORAL
  Filled 2023-05-14: qty 1

## 2023-05-14 MED ORDER — ALBUTEROL SULFATE (2.5 MG/3ML) 0.083% IN NEBU: 2.5000 mg | INHALATION_SOLUTION | RESPIRATORY_TRACT | Status: DC | PRN

## 2023-05-14 MED ORDER — FERROUS SULFATE 325 (65 FE) MG PO TABS
325.0000 mg | ORAL_TABLET | Freq: Every day | ORAL | Status: DC
  Administered 2023-05-15: 325 mg via ORAL
  Filled 2023-05-14: qty 1

## 2023-05-14 MED ORDER — BRIMONIDINE TARTRATE-TIMOLOL 0.2-0.5 % OP SOLN
1.0000 [drp] | Freq: Two times a day (BID) | OPHTHALMIC | Status: DC
  Filled 2023-05-14: qty 5

## 2023-05-14 NOTE — H&P (Signed)
History and Physical    Brian Dodson ZOX:[id number] DOB: Mar 31, 1956 DOA: 05/14/2023  Referring MD/NP/PA:   PCP: Galvin Proffer, MD   Patient coming from:  The patient is coming from ALF.     Chief Complaint: Weakness and AMS  HPI: Brian Dodson is a 67 y.o. male with medical history significant of hypertension, hyperlipidemia, diabetes mellitus, CAD with stent placement, GERD, vascular dementia, schizophrenia, CKD-2, atrial fibrillation on Eliquis, who presents with weakness and AMS.  Per her niece at the bedside, at her normal baseline, patient recognizes family members, knows his living place, but not oriented to the time.  Patient was last known normal at 12:00 today, and then was found to have bilateral leg weakness.  Patient is more confused than baseline.  He moves arms normally.  Per report, patient had right facial droop, but her niece denies right facial droop.  Patient does not have chest pain, cough, shortness of breath.  No nausea, vomiting, diarrhea or abdominal pain.  Denies symptoms of UTI.  When I saw patient in ED, patient is alert, can recognize his niece, not oriented to place and time.  No right facial droop.  Data reviewed independently and ED Course: pt was found to have WBC 11.7, positive urinalysis (hazy appearance, moderate amount leukocyte, positive nitrite, many bacteria, WBC> 50), negative COVID PCR, GFR> 60, temperature 99.4, blood pressure 103/64, heart rate 85, RR 20, oxygen saturation 96% on room air.  Chest x-ray negative.  CT of head is negative.  CTA of head and neck negative for LVO (CTA is a suboptimal study). Pt is placed in tele bed for obs.  MRI-brain: 1. No acute intracranial process. 2. Hemosiderin deposition in bilateral cerebral sulci, compatible with sequela of prior subarachnoid hemorrhage and superficial siderosis, some of which are new from the 2022 MRI. Additional areas of hemosiderin deposition in the left temporal lobe and left occipital  lobe are also new from the prior exam, consistent with interval hemorrhage.     EKG: Not done in ED, will get one.     Review of Systems: Could not be reviewed accurately due to dementia  Allergy:  Allergies  Allergen Reactions   Sulfa Antibiotics Other (See Comments)    Unknown reaction   Xarelto [Rivaroxaban]     Past Medical History:  Diagnosis Date   Anemia    Asthma    Atrial fibrillation (HCC)    Benign prostatic hypertrophy with lower urinary tract symptoms (LUTS)    Bulbous urethral stricture    CAD (coronary artery disease)    s/p coronary stent 2003   CKD (chronic kidney disease)    Constipation    COPD (chronic obstructive pulmonary disease) (HCC)    Diabetes mellitus without complication (HCC)    type 2   Dizziness    Dyspnea    GERD (gastroesophageal reflux disease)    Gross hematuria    Hyperlipemia    Hypertension    Lumbago    Palpitation    Schizophrenia (HCC)    Vascular dementia [facility])     Past Surgical History:  Procedure Laterality Date   CATARACT EXTRACTION W/PHACO Right 01/16/2022   Procedure: CATARACT EXTRACTION PHACO AND INTRAOCULAR LENS PLACEMENT (IOC) RIGHT DIABETIC 4.48 00:34.3;  Surgeon: Galen Manila, MD;  Location: [facility];  Service: Ophthalmology;  Laterality: Right;  Diabetic   CATARACT EXTRACTION W/PHACO Left 03/13/2022   Procedure: CATARACT EXTRACTION PHACO AND INTRAOCULAR LENS PLACEMENT (IOC) LEFT DIABETIC malyugin 8.39 01:02.9 ;  Surgeon: Galen Manila, MD;  Location: [facility];  Service: Ophthalmology;  Laterality: Left;  Diabetic   COLONOSCOPY     COLONOSCOPY WITH PROPOFOL N/A 09/22/2021   Procedure: COLONOSCOPY WITH PROPOFOL;  Surgeon: Regis Bill, MD;  Location: [facility];  Service: Endoscopy;  Laterality: N/A;   COLONOSCOPY WITH PROPOFOL N/A 01/02/2022   Procedure: COLONOSCOPY WITH PROPOFOL;  Surgeon: Regis Bill, MD;  Location: [facility];  Service: Endoscopy;   Laterality: N/A;   CORONARY ANGIOPLASTY WITH STENT PLACEMENT  11/2003   kidney stent     TEE WITHOUT CARDIOVERSION N/A 09/20/2021   Procedure: TRANSESOPHAGEAL ECHOCARDIOGRAM (TEE);  Surgeon: Dalia Heading, MD;  Location: [facility];  Service: Cardiovascular;  Laterality: N/A;    Social History:  reports that he has quit smoking. He has never used smokeless tobacco. He reports that he does not drink alcohol and does not use drugs.  Family History:  Family History  Problem Relation Age of Onset   Diabetes Other    Bladder Cancer Neg Hx    Kidney cancer Neg Hx    Prostate cancer Neg Hx      Prior to Admission medications   Medication Sig Start Date End Date Taking? Authorizing Provider  acetaminophen (TYLENOL) 500 MG tablet Take 1,000 mg by mouth every 6 (six) hours as needed for mild pain.    [provider]  amiodarone (PACERONE) 200 MG tablet Take 1 tablet (200 mg total) by mouth daily. 07/26/21   Charise Killian, MD  aspirin 81 MG tablet Take 81 mg by mouth daily.     [provider]  atorvastatin (LIPITOR) 10 MG tablet Take 10 mg by mouth daily. 05/18/15   [provider]  benztropine (COGENTIN) 1 MG tablet Take 1 mg by mouth at bedtime.     [provider]  brimonidine-timolol (COMBIGAN) 0.2-0.5 % ophthalmic solution Place 1 drop into both eyes every 12 (twelve) hours. Wait 3 to 5 minutes between drops    [provider]  citalopram (CELEXA) 20 MG tablet Take 20 mg by mouth daily.    [provider]  divalproex (DEPAKOTE ER) 500 MG 24 hr tablet Take 2 tablets (1,000 mg total) by mouth 2 (two) times daily. 06/18/22   Esaw Grandchild A, DO  donepezil (ARICEPT) 10 MG tablet Take 10 mg by mouth at bedtime.     [provider]  ELIQUIS 5 MG TABS tablet Take 5 mg by mouth 2 (two) times daily. 05/18/15   [provider]  ferrous sulfate 325 (65 FE) MG tablet Take 1 tablet (325 mg total) by mouth daily with breakfast. (Hold  for 1 week, resume on 06/25/22) 06/18/22   Pennie Banter, DO  lanolin/mineral oil (KERI/THERA-DERM) LOTN Apply 1 application topically as needed for dry skin. Apply to face after washing face daily    [provider]  leptospermum manuka honey (MEDIHONEY) PSTE paste Apply 1 Application topically daily. 06/18/22   Pennie Banter, DO  loratadine (CLARITIN) 10 MG tablet Take 10 mg by mouth daily. 05/01/22   [provider]  LUMIGAN 0.01 % SOLN Place 1 drop into both eyes at bedtime. 07/06/21   [provider]  Magnesium Oxide 400 MG CAPS Take 1 capsule (400 mg total) by mouth daily. (Hold for 1 week, resume on 06/25/22) 06/18/22   Esaw Grandchild A, DO  melatonin 5 MG TABS Take 5 mg by mouth at bedtime.    [provider]  Menthol,  Topical Analgesic, (BIOFREEZE) 4 % GEL Apply 1 application topically 3 (three) times daily. To lower back area as needed for pain    [provider]  metFORMIN (GLUCOPHAGE) 500 MG tablet Take 500 mg by mouth 2 (two) times daily with a meal.    [provider]  omeprazole (PRILOSEC) 20 MG capsule Take 20 mg by mouth 2 (two) times daily before a meal. May open and sprinkle in applesauce.    [provider]  [name] (OYSTER CALCIUM) 500 MG TABS tablet Take 500 mg of elemental calcium by mouth 2 (two) times daily.    [provider]  risperidone (RISPERDAL) 4 MG tablet Take 4 mg by mouth at bedtime. 05/18/15   [provider]  sodium chloride (OCEAN) 0.65 % SOLN nasal spray Place 1 spray into both nostrils daily.    [provider]  zinc oxide 20 % ointment Apply 1 Application topically 2 (two) times daily as needed for irritation (to left buttock).    [provider]    Physical Exam: Vitals:   05/14/23 1830 05/14/23 1900 05/14/23 2100 05/14/23 2145  BP: 129/79 (!) 136/96 127/86 120/87  Pulse: (!) 58 73 85 84  Resp: (!) 21 (!) 24  19  Temp:    98 F (36.7 C)  TempSrc:       SpO2: 98% 100% 97% 100%  Weight:    106.5 kg  Height:    6\' 4"  (1.93 m)   General: Not in acute distress HEENT:       Eyes: PERRL, EOMI, no jaundice       ENT: No discharge from the ears and nose.       Neck: No JVD, no bruit, no mass felt. Heme: No neck lymph node enlargement. Cardiac: S1/S2, RRR, No murmurs, No gallops or rubs. Respiratory: No rales, wheezing, rhonchi or rubs. GI: Soft, nondistended, nontender, no organomegaly, BS present. GU: No hematuria Ext: No pitting leg edema bilaterally. 1+DP/PT pulse bilaterally. Musculoskeletal: No joint deformities, No joint redness or warmth, no limitation of ROM in spin. Skin: No rashes.  Neuro: Alert, oriented to person, not oriented to time and place.  Cranial nerves II-XII grossly intact, muscle strength normal in arms.  Has symmetrical weakness in legs with muscle strength 3/5 in both legs. Psych: Patient is not psychotic, no suicidal or hemocidal ideation.  Labs on Admission: I have personally reviewed following labs and imaging studies  CBC: Recent Labs  Lab 05/14/23 1402  WBC 11.7*  NEUTROABS 8.5*  HGB 10.9*  HCT 34.0*  MCV 99.7  PLT 146*   Basic Metabolic Panel: Recent Labs  Lab 05/14/23 1402  NA 136  K 4.2  CL 104  CO2 25  GLUCOSE 147*  BUN 25*  CREATININE 1.19  CALCIUM 8.9   GFR: Estimated Creatinine Clearance: 80.7 mL/min (by C-G formula based on SCr of 1.19 mg/dL). Liver Function Tests: Recent Labs  Lab 05/14/23 1402  AST 22  ALT 13  ALKPHOS 39  BILITOT 0.6  PROT 6.8  ALBUMIN 3.4*   No results for input(s): "LIPASE", "AMYLASE" in the last 168 hours. No results for input(s): "AMMONIA" in the last 168 hours. Coagulation Profile: Recent Labs  Lab 05/14/23 1402  INR 1.2   Cardiac Enzymes: No results for input(s): "CKTOTAL", "CKMB", "CKMBINDEX", "TROPONINI" in the last 168 hours. BNP (last 3 results) No results for input(s): "PROBNP" in the last 8760 hours. HbA1C: No results for input(s):  "HGBA1C" in the last 72  hours. CBG: Recent Labs  Lab 05/14/23 2236  GLUCAP 72   Lipid Profile: No results for input(s): "CHOL", "HDL", "LDLCALC", "TRIG", "CHOLHDL", "LDLDIRECT" in the last 72 hours. Thyroid Function Tests: No results for input(s): "TSH", "T4TOTAL", "FREET4", "T3FREE", "THYROIDAB" in the last 72 hours. Anemia Panel: No results for input(s): "VITAMINB12", "FOLATE", "FERRITIN", "TIBC", "IRON", "RETICCTPCT" in the last 72 hours. Urine analysis:    Component Value Date/Time   COLORURINE YELLOW (A) 05/14/2023 1443   APPEARANCEUR HAZY (A) 05/14/2023 1443   APPEARANCEUR Clear 03/14/2016 1027   LABSPEC 1.039 (H) 05/14/2023 1443   LABSPEC 1.017 01/29/2014 1545   PHURINE 6.0 05/14/2023 1443   GLUCOSEU NEGATIVE 05/14/2023 1443   GLUCOSEU Negative 01/29/2014 1545   HGBUR NEGATIVE 05/14/2023 1443   BILIRUBINUR NEGATIVE 05/14/2023 1443   BILIRUBINUR Negative 03/14/2016 1027   BILIRUBINUR Negative 01/29/2014 1545   KETONESUR NEGATIVE 05/14/2023 1443   PROTEINUR 30 (A) 05/14/2023 1443   NITRITE POSITIVE (A) 05/14/2023 1443   LEUKOCYTESUR MODERATE (A) 05/14/2023 1443   LEUKOCYTESUR Negative 01/29/2014 1545   Sepsis Labs: @LABRCNTIP (procalcitonin:4,lacticidven:4) ) Recent Results (from the past 240 hour(s))  SARS Coronavirus 2 by RT PCR (hospital order, performed in [facility] hospital lab) *cepheid single result test* Anterior Nasal Swab     Status: None   Collection Time: 05/14/23  2:43 PM   Specimen: Anterior Nasal Swab  Result Value Ref Range Status   SARS Coronavirus 2 by RT PCR NEGATIVE NEGATIVE Final    Comment: (NOTE) SARS-CoV-2 target nucleic acids are NOT DETECTED.  The SARS-CoV-2 RNA is generally detectable in upper and lower respiratory specimens during the acute phase of infection. The lowest concentration of SARS-CoV-2 viral copies this assay can detect is 250 copies / mL. A negative result does not preclude SARS-CoV-2 infection and should not be used as  the sole basis for treatment or other patient management decisions.  A negative result may occur with improper specimen collection / handling, submission of specimen other than nasopharyngeal swab, presence of viral mutation(s) within the areas targeted by this assay, and inadequate number of viral copies (<250 copies / mL). A negative result must be combined with clinical observations, patient history, and epidemiological information.  Fact Sheet for Patients:   RoadLapTop.co.za  Fact Sheet for Healthcare Providers: http://kim-miller.com/  This test is not yet approved or  cleared by the [place] FDA and has been authorized for detection and/or diagnosis of SARS-CoV-2 by FDA under an Emergency Use Authorization (EUA).  This EUA will remain in effect (meaning this test can be used) for the duration of the COVID-19 declaration under Section 564(b)(1) of the Act, 21 U.S.C. section 360bbb-3(b)(1), unless the authorization is terminated or revoked sooner.  Performed at [facility], [address] [place], [place] [address]      Radiological Exams on Admission: MR BRAIN WO CONTRAST  Result Date: 05/14/2023 CLINICAL DATA:  Altered mental status EXAM: MRI HEAD WITHOUT CONTRAST TECHNIQUE: Multiplanar, multiecho pulse sequences of the brain and surrounding structures were obtained without intravenous contrast. COMPARISON:  03/03/2021 MRI head, correlation is also made with 05/14/2023 CT head FINDINGS: Brain: No restricted diffusion to suggest acute or subacute infarct. No acute hemorrhage, mass, mass effect, or midline shift. No hydrocephalus or extra-axial collection. Partial empty sella. Normal craniocervical junction. Hemosiderin deposition in bilateral cerebral sulci (for example series 13, image 42, 28, 21), compatible with sequela of prior subarachnoid hemorrhage and superficial siderosis, some of which are new from the 2022 MRI.  Additional  areas of hemosiderin deposition in the left temporal lobe and left occipital lobe, which are new from the prior exam, with redemonstrated hemosiderin deposition in the vermis and right cerebellum. Advanced cerebral volume loss for age. This is most pronounced in the left temporal lobe. Confluent T2 hyperintense signal in the periventricular white matter, likely the sequela of moderate chronic small vessel ischemic disease. Vascular: Normal arterial flow voids. Skull and upper cervical spine: Normal marrow signal. Sinuses/Orbits: Clear paranasal sinuses. No acute finding in the orbits. Status post bilateral lens replacements. Other: The mastoid air cells are well aerated. IMPRESSION: 1. No acute intracranial process. 2. Hemosiderin deposition in bilateral cerebral sulci, compatible with sequela of prior subarachnoid hemorrhage and superficial siderosis, some of which are new from the 2022 MRI. Additional areas of hemosiderin deposition in the left temporal lobe and left occipital lobe are also new from the prior exam, consistent with interval hemorrhage. Electronically Signed   By: Wiliam Ke M.D.   On: 05/14/2023 20:05   DG Chest Portable 1 View  Result Date: 05/14/2023 CLINICAL DATA:  Shortness of breath EXAM: PORTABLE CHEST 1 VIEW COMPARISON:  X-ray 12/17/2022 and older FINDINGS: Underinflated x-ray. Borderline cardiopericardial silhouette. No consolidation, pneumothorax or effusion. No edema. Overlapping cardiac leads. Degenerative changes along the spine. IMPRESSION: No acute cardiopulmonary disease. Electronically Signed   By: Karen Kays M.D.   On: 05/14/2023 15:25   CT ANGIO HEAD NECK W WO CM (CODE STROKE)  Result Date: 05/14/2023 CLINICAL DATA:  Stroke. EXAM: CT ANGIOGRAPHY HEAD AND NECK WITH AND WITHOUT CONTRAST TECHNIQUE: Multidetector CT imaging of the head and neck was performed using the standard protocol during bolus administration of intravenous contrast. Multiplanar CT image  reconstructions and MIPs were obtained to evaluate the vascular anatomy. Carotid stenosis measurements (when applicable) are obtained utilizing NASCET criteria, using the distal internal carotid diameter as the denominator. RADIATION DOSE REDUCTION: This exam was performed according to the departmental dose-optimization program which includes automated exposure control, adjustment of the mA and/or kV according to patient size and/or use of iterative reconstruction technique. CONTRAST:  50mL OMNIPAQUE IOHEXOL 350 MG/ML SOLN COMPARISON:  CTA head 01/29/2014.  MRI head 03/03/2021. FINDINGS: CTA NECK FINDINGS Aortic arch: Standard 3 vessel aortic arch with widely patent arch vessel origins. Right carotid system: Patent without evidence of stenosis or dissection. Left carotid system: Patent with a small amount of calcified plaque at the carotid bifurcation. No evidence of a significant stenosis or dissection. Vertebral arteries: The right vertebral artery is patent and strongly dominant without evidence of stenosis or dissection. The left vertebral artery arises from the proximal most aspect of the left subclavian artery and is diffusely hypoplastic but patent. 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 Moderate cervical spondylosis and asymmetric right-sided facet arthrosis. Other neck: No evidence of cervical lymphadenopathy or mass. Upper chest: Clear lung apices. Review of the MIP images confirms the above findings CTA HEAD FINDINGS Suboptimal arterial opacification limits assessment, particularly of the small and medium-sized arteries. Anterior circulation: The internal carotid arteries are patent from skull base to carotid termini with mild atherosclerotic plaque bilaterally and no suspected high-grade stenosis although assessment is limited by study quality. ACAs and MCAs are patent without evidence a proximal branch occlusion within study  limitations. No aneurysm is identified. Posterior circulation: The intracranial vertebral arteries are patent to the basilar with the right being strongly dominant. The basilar artery is patent with an  apparent punctate filling defect proximally attributed to artifact given similar findings in other similarly sized vessels (such as the right V4 segment). There are patent posterior communicating arteries bilaterally. The PCAs are patent with detailed assessment for stenosis precluded by poor study quality. No aneurysm is identified. Venous sinuses: As permitted by contrast timing, patent. Anatomic variants: None. Review of the MIP images confirms the above findings These results were communicated to Dr. Wilford Corner at 2:23 pm on 05/14/2023 by text page via the [facility] messaging system. IMPRESSION: 1. Suboptimal CTA without evidence of a large vessel occlusion. 2. Mild cervical carotid artery atherosclerosis without stenosis. Electronically Signed   By: Sebastian Ache M.D.   On: 05/14/2023 14:36   CT HEAD CODE STROKE WO CONTRAST  Result Date: 05/14/2023 CLINICAL DATA:  Code stroke. Neuro deficit, acute, stroke suspected. Altered mental status. Right facial droop. EXAM: CT HEAD WITHOUT CONTRAST TECHNIQUE: Contiguous axial images were obtained from the base of the skull through the vertex without intravenous contrast. RADIATION DOSE REDUCTION: This exam was performed according to the departmental dose-optimization program which includes automated exposure control, adjustment of the mA and/or kV according to patient size and/or use of iterative reconstruction technique. COMPARISON:  Head CT 12/18/2022 FINDINGS: Brain: A small hypodensity in the left paracentral pons is favored to reflect skull base artifact on reformats. No acute supratentorial infarct, intracranial hemorrhage, mass, midline shift, or extra-axial fluid collection is identified. Patchy hypodensities in the cerebral white matter, greatest in the left parietal  lobe, are unchanged and nonspecific but compatible with moderate chronic small vessel ischemic disease. There is mild-to-moderate cerebral atrophy including asymmetric volume loss in the left temporal lobe. Vascular: Calcified atherosclerosis at the skull base. No hyperdense vessel. Skull: No acute fracture or suspicious osseous lesion. Sinuses/Orbits: Paranasal sinuses and mastoid air cells are clear. Bilateral cataract extraction. Other: None. ASPECTS ([place] Stroke Program Early CT Score) - Ganglionic level infarction (caudate, lentiform nuclei, internal capsule, insula, M1-M3 cortex): 7 - Supraganglionic infarction (M4-M6 cortex): 3 Total score (0-10 with 10 being normal): 10 These results were communicated to Dr. Wilford Corner at 2:23 pm on 05/14/2023 by text page via the [facility] messaging system. IMPRESSION: 1. No evidence of acute intracranial abnormality. ASPECTS of 10. 2. Moderate chronic small vessel ischemic disease. Electronically Signed   By: Sebastian Ache M.D.   On: 05/14/2023 14:24      Assessment/Plan Principal Problem:   Acute metabolic encephalopathy Active Problems:   UTI (urinary tract infection)   CAD (coronary artery disease)   CKD (chronic kidney disease) stage 2, GFR 60-89 ml/min   HLD (hyperlipidemia)   PAF (paroxysmal atrial fibrillation) (HCC)   Schizophrenia (HCC)   Depression   Type II diabetes mellitus with renal manifestations (HCC)   Dementia (HCC)   Assessment and Plan:  Acute metabolic encephalopathy: Likely due to UTI.  CT head negative.  MRI of the brain is negative for stroke. -Placed on telemetry bed for observation -Frequent neurocheck -Fall precaution  UTI (urinary tract infection) -Rocephin -Follow-up urine culture  CAD (coronary artery disease) -Aspirin, Lipitor  CKD (chronic kidney disease) stage 2, GFR 60-89 ml/min: Stable, GFR> 60 today -Follow-up with BMP  HLD (hyperlipidemia) -Lipitor  PAF (paroxysmal atrial fibrillation)  (HCC) -Eliquis -Amiodarone  Schizophrenia (HCC) and Depression: -Celexa, Depakote, risperidone, Cogentin  Type II diabetes mellitus with renal manifestations (HCC): Recent A1c 6.1, well-controlled.  Patient is taking metformin -SSI  Dementia (HCC) -Donepezil,    DVT ppx:on Eliquis  Code Status: Full code per his  niece  Family Communication:     Yes, patient's niece   at bed side.    Disposition Plan:  Anticipate discharge back to previous environment, ALF  Consults called:  none  Admission status and Level of care: Telemetry Medical:   for obs   Dispo: The patient is from: ALF              Anticipated d/c is to: ALF              Anticipated d/c date is: 1 day              Patient currently is not medically stable to d/c.    Severity of Illness:  The appropriate patient status for this patient is INPATIENT. Inpatient status is judged to be reasonable and necessary in order to provide the required intensity of service to ensure the patient's safety. The patient's presenting symptoms, physical exam findings, and initial radiographic and laboratory data in the context of their chronic comorbidities is felt to place them at high risk for further clinical deterioration. Furthermore, it is not anticipated that the patient will be medically stable for discharge from the hospital within 2 midnights of admission.   * I certify that at the point of admission it is my clinical judgment that the patient will require inpatient hospital care spanning beyond 2 midnights from the point of admission due to high intensity of service, high risk for further deterioration and high frequency of surveillance required.*       Date of Service 05/14/2023    Lorretta Harp [facility]   If 7PM-7AM, please contact night-coverage [email or web address] 05/14/2023, 10:44 PM

## 2023-05-14 NOTE — Consult Note (Signed)
Neurology Consultation  Reason for Consult: Code stroke-sudden change in mental status, unresponsiveness Referring Physician: Dr. Collier Salina  CC: Code stroke for change in mentation, unresponsiveness and the patient on Eliquis  History is obtained from: EMS, chart review  HPI: Brian Dodson is a 67 y.o. male past medical history of atrial fibrillation on Eliquis resident of a assisted living facility, vascular dementia, diabetes, hypertension, hyperlipidemia, schizophrenia brought in from his facility after he was noticed that he was not behaving like himself and was altered and less responsive than usual.  At baseline he is able to walk, bathe himself and feed himself, came to lunch last witnessed normal at 12 PM and at a later point noted to be not talking and not responding.  Blood pressure was noted to be systolic 80s to 90s. No facility member accompanying the patient daughter at bedside Patient unable to provide history  LKW: 12 PM per EMS report IV thrombolysis given?: no, on Eliquis EVT: No E BL Premorbid modified Rankin scale (mRS): 3 based on the prior documented MR S   ROS: Unable to obtain due to altered mental status.   Past Medical History:  Diagnosis Date   Anemia    Asthma    Atrial fibrillation (HCC)    Benign prostatic hypertrophy with lower urinary tract symptoms (LUTS)    Bulbous urethral stricture    CAD (coronary artery disease)    s/p coronary stent 2003   CKD (chronic kidney disease)    Constipation    COPD (chronic obstructive pulmonary disease) (HCC)    Diabetes mellitus without complication (HCC)    type 2   Dizziness    Dyspnea    GERD (gastroesophageal reflux disease)    Gross hematuria    Hyperlipemia    Hypertension    Lumbago    Palpitation    Schizophrenia (HCC)    Vascular dementia (HCC)    Family History  Problem Relation Age of Onset   Diabetes Other    Bladder Cancer Neg Hx    Kidney cancer Neg Hx    Prostate cancer Neg Hx     Social History:   reports that he has quit smoking. He has never used smokeless tobacco. He reports that he does not drink alcohol and does not use drugs.  Medications  Current Facility-Administered Medications:    sodium chloride flush (NS) 0.9 % injection 3 mL, 3 mL, Intravenous, Once, Sidney Ace, Teddy Spike, MD  Current Outpatient Medications:    acetaminophen (TYLENOL) 500 MG tablet, Take 1,000 mg by mouth every 6 (six) hours as needed for mild pain., Disp: , Rfl:    amiodarone (PACERONE) 200 MG tablet, Take 1 tablet (200 mg total) by mouth daily., Disp: , Rfl:    aspirin 81 MG tablet, Take 81 mg by mouth daily. , Disp: , Rfl:    atorvastatin (LIPITOR) 10 MG tablet, Take 10 mg by mouth daily., Disp: , Rfl:    benztropine (COGENTIN) 1 MG tablet, Take 1 mg by mouth at bedtime. , Disp: , Rfl:    brimonidine-timolol (COMBIGAN) 0.2-0.5 % ophthalmic solution, Place 1 drop into both eyes every 12 (twelve) hours. Wait 3 to 5 minutes between drops, Disp: , Rfl:    citalopram (CELEXA) 20 MG tablet, Take 20 mg by mouth daily., Disp: , Rfl:    divalproex (DEPAKOTE ER) 500 MG 24 hr tablet, Take 2 tablets (1,000 mg total) by mouth 2 (two) times daily., Disp: 60 tablet, Rfl: 1   donepezil (ARICEPT)  10 MG tablet, Take 10 mg by mouth at bedtime. , Disp: , Rfl:    ELIQUIS 5 MG TABS tablet, Take 5 mg by mouth 2 (two) times daily., Disp: , Rfl:    ferrous sulfate 325 (65 FE) MG tablet, Take 1 tablet (325 mg total) by mouth daily with breakfast. (Hold for 1 week, resume on 06/25/22), Disp: , Rfl: 3   lanolin/mineral oil (KERI/THERA-DERM) LOTN, Apply 1 application topically as needed for dry skin. Apply to face after washing face daily, Disp: , Rfl:    leptospermum manuka honey (MEDIHONEY) PSTE paste, Apply 1 Application topically daily., Disp: 44 mL, Rfl: 0   loratadine (CLARITIN) 10 MG tablet, Take 10 mg by mouth daily., Disp: , Rfl:    LUMIGAN 0.01 % SOLN, Place 1 drop into both eyes at bedtime., Disp: , Rfl:     Magnesium Oxide 400 MG CAPS, Take 1 capsule (400 mg total) by mouth daily. (Hold for 1 week, resume on 06/25/22), Disp: , Rfl: 0   melatonin 5 MG TABS, Take 5 mg by mouth at bedtime., Disp: , Rfl:    Menthol, Topical Analgesic, (BIOFREEZE) 4 % GEL, Apply 1 application topically 3 (three) times daily. To lower back area as needed for pain, Disp: , Rfl:    metFORMIN (GLUCOPHAGE) 500 MG tablet, Take 500 mg by mouth 2 (two) times daily with a meal., Disp: , Rfl:    omeprazole (PRILOSEC) 20 MG capsule, Take 20 mg by mouth 2 (two) times daily before a meal. May open and sprinkle in applesauce., Disp: , Rfl:    Oyster Shell (OYSTER CALCIUM) 500 MG TABS tablet, Take 500 mg of elemental calcium by mouth 2 (two) times daily., Disp: , Rfl:    risperidone (RISPERDAL) 4 MG tablet, Take 4 mg by mouth at bedtime., Disp: , Rfl:    sodium chloride (OCEAN) 0.65 % SOLN nasal spray, Place 1 spray into both nostrils daily., Disp: , Rfl:    zinc oxide 20 % ointment, Apply 1 Application topically 2 (two) times daily as needed for irritation (to left buttock)., Disp: , Rfl:   Exam: Current vital signs: Pulse 85   Temp 99.4 F (37.4 C) (Oral)   Resp 20   Ht 6\' 4"  (1.93 m)   Wt 107.9 kg   SpO2 96%   BMI 28.95 kg/m  Vital signs in last 24 hours: Temp:  [99.4 F (37.4 C)] 99.4 F (37.4 C) (06/25 1424) Pulse Rate:  [85] 85 (06/25 1424) Resp:  [20] 20 (06/25 1424) SpO2:  [96 %] 96 % (06/25 1424) Weight:  [107.9 kg] 107.9 kg (06/25 1419)  General: Drowsy, opens eyes to voice, is able to say his name. HEENT: Normocephalic atraumatic edentulous CVS: Regular rate rhythm Respiratory: Breathing well saturating normally on room air Abdomen nondistended nontender Extremities warm well-perfused Neurological exam Drowsy, opens eyes to voice.  Able to say his name.  Not able to tell me his date of birth age or current month. Unable to follow commands consistently and keeps falling asleep Moderately dysarthric  speech-logopenic Cranials 2-12: Pupils equal round react light, no gaze preference or deviation, blinks to threat from both sides, facial symmetry difficult to ascertain due to edentulousness. Motor examination with no drift although he is slow to follow commands Sensation intact Coordination difficult to assess NIH stroke scale 1a Level of Conscious.: 1 1b LOC Questions: 2 1c LOC Commands: 2 2 Best Gaze: 0 3 Visual: 0 4 Facial Palsy: 0 5a Motor Arm -  left: 0 5b Motor Arm - Right: 0 6a Motor Leg - Left: 0 6b Motor Leg - Right: 0 7 Limb Ataxia: 0 8 Sensory: 0 9 Best Language: 2 10 Dysarthria: 2 11 Extinct. and Inatten.: 0 TOTAL: 9   Labs I have reviewed labs in epic and the results pertinent to this consultation are:  CBC    Component Value Date/Time   WBC 11.7 (H) 05/14/2023 1402   RBC 3.41 (L) 05/14/2023 1402   HGB 10.9 (L) 05/14/2023 1402   HGB 9.4 (L) 02/14/2015 1427   HCT 34.0 (L) 05/14/2023 1402   HCT 29.6 (L) 02/14/2015 1427   PLT 146 (L) 05/14/2023 1402   PLT 106 (L) 02/14/2015 1427   MCV 99.7 05/14/2023 1402   MCV 101 (H) 02/14/2015 1427   MCH 32.0 05/14/2023 1402   MCHC 32.1 05/14/2023 1402   RDW 14.5 05/14/2023 1402   RDW 15.3 (H) 02/14/2015 1427   LYMPHSABS 1.9 05/14/2023 1402   LYMPHSABS 2.8 02/14/2015 1427   MONOABS 1.1 (H) 05/14/2023 1402   MONOABS 0.5 02/14/2015 1427   EOSABS 0.0 05/14/2023 1402   EOSABS 0.1 02/14/2015 1427   BASOSABS 0.0 05/14/2023 1402   BASOSABS 0.0 02/14/2015 1427    CMP     Component Value Date/Time   NA 136 05/14/2023 1402   NA 138 02/14/2015 1427   K 4.2 05/14/2023 1402   K 4.3 02/14/2015 1427   CL 104 05/14/2023 1402   CL 104 02/14/2015 1427   CO2 25 05/14/2023 1402   CO2 30 02/14/2015 1427   GLUCOSE 147 (H) 05/14/2023 1402   GLUCOSE 86 02/14/2015 1427   BUN 25 (H) 05/14/2023 1402   BUN 33 (H) 02/14/2015 1427   CREATININE 1.19 05/14/2023 1402   CREATININE 1.12 02/14/2015 1427   CALCIUM 8.9 05/14/2023 1402    CALCIUM 9.0 02/14/2015 1427   PROT 6.8 05/14/2023 1402   PROT 6.1 (L) 02/01/2014 0500   ALBUMIN 3.4 (L) 05/14/2023 1402   ALBUMIN 2.8 (L) 02/01/2014 0500   AST 22 05/14/2023 1402   AST 25 02/01/2014 0500   ALT 13 05/14/2023 1402   ALT 23 02/01/2014 0500   ALKPHOS 39 05/14/2023 1402   ALKPHOS 40 (L) 02/01/2014 0500   BILITOT 0.6 05/14/2023 1402   BILITOT 0.5 02/01/2014 0500   GFRNONAA >60 05/14/2023 1402   GFRNONAA >60 02/14/2015 1427   GFRAA >60 11/18/2018 1154   GFRAA >60 02/14/2015 1427    Imaging I have reviewed the images obtained: CT head with no changes CTA head and neck-poor quality due to bolus timing but no emergent LVO  Assessment: 67 year old past history of A-fib on Eliquis, resident of an assisted living facility, vascular dementia, diabetes, hypertension, hyperlipidemia schizophrenia brought in after noticed to have change in mentation and less responsiveness.  Was hypotensive at the facility.  Examination more consistent with encephalopathy than stroke. Noncontrast head CT negative for bleed or acute process.  No emergent LVO. Not a candidate for IV thrombolysis given exam less consistent with stroke and also the fact that he is on Eliquis precludes him from IV thrombolysis. In the past has been seen with similar presentation in the setting of a UTI and fevers. I suspected toxic metabolic encephalopathy-further workup as per ER but will recommend MRI to rule out stroke.  Recommendations: Chest x-ray, UA MRI brain without contrast Full stroke workup only if the MRI reveals a stroke-otherwise correction of toxic metabolic derangements per primary team. Plan discussed with Dr. Sidney Ace  --  Amie Portland, MD Neurologist [facility] Pager: [phone]

## 2023-05-14 NOTE — ED Triage Notes (Signed)
Patient arrived to ED via EMS from [facility]; per staff at facility patient was ambulatory and last known normal at 1200. Prior to calling EMS patient had right sided facial droop and bilateral weakness; patient is on eliquis.   BG 159

## 2023-05-14 NOTE — ED Notes (Signed)
Patient cleaned of urinary incontinence.

## 2023-05-14 NOTE — ED Notes (Signed)
Pt gone to MRI at this time

## 2023-05-14 NOTE — Progress Notes (Signed)
   05/14/23 1420  Spiritual Encounters  Type of Visit Initial  Care provided to: Pt not available  Conversation partners present during encounter Nurse  Referral source Code page  Reason for visit Code (Stroke)  OnCall Visit Yes  Spiritual Care Plan  Spiritual Care Issues Still Outstanding No further spiritual care needs at this time (see row info)  Advance Directives (For Healthcare)  Does Patient Have a Medical Advance Directive? Unable to assess, patient is non-responsive or altered mental status  Mental Health Advance Directives  Does Patient Have a Mental Health Advance Directive? Unable to assess, patient is non-responsive or altered mental status   Chaplain responded to code stroke. Patient is being cared for by interdisciplinary team at CT. No family present. Chaplain services remain available for follow up spiritual and emotional support as needed. Please consult if needs arise.

## 2023-05-14 NOTE — ED Provider Notes (Signed)
[facility] Provider Note    Event Date/Time   First MD Initiated Contact with Patient 05/14/23 1402     (approximate)   History   Weakness   HPI  Brian Dodson is a 67 y.o. male with past medical history of atrial fibrillation, vascular dementia, schizophrenia who presents with altered mental status.  Per EMS he typically walks and talks but around 12 today they found him to be not able to ambulate and altered.  Patient cannot really tell me why he is here.  He is denying acute pain.  Patient was last hospitalized in February 2024.  He presented with UTI and COVID.  He grew Klebsiella in his urine culture completed course of antibiotics.     Past Medical History:  Diagnosis Date   Anemia    Asthma    Atrial fibrillation (HCC)    Benign prostatic hypertrophy with lower urinary tract symptoms (LUTS)    Bulbous urethral stricture    CAD (coronary artery disease)    s/p coronary stent 2003   CKD (chronic kidney disease)    Constipation    COPD (chronic obstructive pulmonary disease) (HCC)    Diabetes mellitus without complication (HCC)    type 2   Dizziness    Dyspnea    GERD (gastroesophageal reflux disease)    Gross hematuria    Hyperlipemia    Hypertension    Lumbago    Palpitation    Schizophrenia (HCC)    Vascular dementia [facility])     Patient Active Problem List   Diagnosis Date Noted   AKI (acute kidney injury) (HCC) 12/18/2022   Sepsis due to gram-negative UTI (HCC) 12/18/2022   COVID-19 virus infection 12/18/2022   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/18/2022   Cellulitis of left lower extremity 06/14/2022   COPD (chronic obstructive pulmonary disease) (HCC)    CKD (chronic kidney disease) stage 2, GFR 60-89 ml/min    HLD (hyperlipidemia)    Type II diabetes mellitus with renal manifestations (HCC)    Depression    Cellulitis and abscess of left leg 02/12/2022   Streptococcal bacteremia 09/18/2021   Acute renal  failure superimposed on stage 2 chronic kidney disease (HCC) 09/17/2021   Sepsis secondary to UTI (HCC) 03/05/2021   Acute metabolic encephalopathy 03/05/2021   Dementia (HCC) 03/05/2021   Memory loss due to medical condition 03/05/2021   PAF (paroxysmal atrial fibrillation) (HCC) 03/05/2021   Atrial flutter (HCC) 03/05/2021   Diabetes mellitus type 2, controlled, without complications (HCC) 03/05/2021   Obese 03/05/2021   Anemia of chronic disease 03/04/2021   Acute lower UTI 03/02/2021   Acute febrile illness    Bilateral lower extremity edema 04/01/2018   Lower extremity pain, bilateral 04/01/2018   Swelling of limb 10/04/2017   Sepsis (HCC) 05/20/2017   Anemia 05/07/2017   B12 deficiency 05/07/2017   Loss of memory 05/07/2017   Paroxysmal atrial fibrillation with RVR (HCC) 05/23/2015   Syncope 05/23/2015   Schizophrenia (HCC) 05/23/2015   Diabetes mellitus, type II (HCC) 05/23/2015   Essential hypertension 05/23/2015   GERD (gastroesophageal reflux disease) 05/23/2015   CAD (coronary artery disease) 05/23/2015     Physical Exam  Triage Vital Signs: ED Triage Vitals  Enc Vitals Group     BP 05/14/23 1430 103/64     Pulse Rate 05/14/23 1424 85     Resp 05/14/23 1424 20     Temp 05/14/23 1424 99.4 F (37.4 C)  Temp Source 05/14/23 1424 Oral     SpO2 05/14/23 1424 96 %     Weight 05/14/23 1419 237 lb 12.8 oz (107.9 kg)     Height 05/14/23 1419 6\' 4"  (1.93 m)     Head Circumference --      Peak Flow --      Pain Score 05/14/23 1424 0     Pain Loc --      Pain Edu? --      Excl. in GC? --     Most recent vital signs: Vitals:   05/14/23 1424 05/14/23 1430  BP:  103/64  Pulse: 85   Resp: 20   Temp: 99.4 F (37.4 C)   SpO2: 96%      General: Awake, no distress.  CV:  Good peripheral perfusion.  Resp:  Normal effort.  Abd:  No distention.  Neuro:             Awake, Alert, Oriented x 1-oriented to self only, somewhat drowsy but does open his eyes to  voice Other:  Patient is edentulous with no facial droop face is symmetric EOMI Patient is able to hold both arms and legs off the stretcher for 5 seconds Unable to test finger-nose-finger    ED Results / Procedures / Treatments  Labs (all labs ordered are listed, but only abnormal results are displayed) Labs Reviewed  CBC - Abnormal; Notable for the following components:      Result Value   WBC 11.7 (*)    RBC 3.41 (*)    Hemoglobin 10.9 (*)    HCT 34.0 (*)    Platelets 146 (*)    All other components within normal limits  DIFFERENTIAL - Abnormal; Notable for the following components:   Neutro Abs 8.5 (*)    Monocytes Absolute 1.1 (*)    All other components within normal limits  COMPREHENSIVE METABOLIC PANEL - Abnormal; Notable for the following components:   Glucose, Bld 147 (*)    BUN 25 (*)    Albumin 3.4 (*)    All other components within normal limits  URINALYSIS, ROUTINE W REFLEX MICROSCOPIC - Abnormal; Notable for the following components:   Color, Urine YELLOW (*)    APPearance HAZY (*)    Specific Gravity, Urine 1.039 (*)    Protein, ur 30 (*)    Nitrite POSITIVE (*)    Leukocytes,Ua MODERATE (*)    Bacteria, UA MANY (*)    All other components within normal limits  SARS CORONAVIRUS 2 BY RT PCR  URINE CULTURE  PROTIME-INR  APTT  ETHANOL  CBG MONITORING, ED     EKG  Reviewed interpreted patient's EKG which shows atrial flutter with variable conduction normal axis and intervals no acute ischemic changes   RADIOLOGY I reviewed and interpreted the CT scan of the brain which does not show any acute intracranial process    PROCEDURES:  Critical Care performed: Yes, see critical care procedure note(s)  Procedures  The patient is on the cardiac monitor to evaluate for evidence of arrhythmia and/or significant heart rate changes.   MEDICATIONS ORDERED IN ED: Medications  cefTRIAXone (ROCEPHIN) 1 g in sodium chloride 0.9 % 100 mL IVPB (1 g  Intravenous New Bag/Given 05/14/23 1521)  iohexol (OMNIPAQUE) 350 MG/ML injection 50 mL (50 mLs Intravenous Contrast Given 05/14/23 1415)  lactated ringers bolus 1,000 mL (1,000 mLs Intravenous New Bag/Given 05/14/23 1516)     IMPRESSION / MDM / ASSESSMENT AND PLAN / ED COURSE  I reviewed the triage vital signs and the nursing notes.                              Patient's presentation is most consistent with acute presentation with potential threat to life or bodily function.  Differential diagnosis includes, but is not limited to, intracranial hemorrhage, ischemic stroke, electrolyte abnormality, infection, metabolic abnormality hypo or hyperglycemia  The patient is a 68 year old male with history of atrial flutter on Eliquis who presents with an acute change in mental status.  Was last seen himself which apparently includes him being able to walk and talk and feed himself around 1215.  Was then noted to have an acute change in mental status, less somnolent difficulty following commands.  Stroke alert was called from the field.  On arrival patient is alert and oriented x 1 he does seem to lean toward his right somewhat.  He is a dentulous but does not have obvious facial droop.  Does have some difficulty following commands but when I get him to raise his arms and legs off the bed he is able to do so and hold both extremities up.  Difficulty testing finger-nose-finger.  Pt was evaluated by neurology.  CT head and CT angio head and neck were obtained which did not have any acute findings.  Dr. Wilford Corner with neurology felt that this was more likely to be a encephalopathy.  Patient's blood pressure somewhat soft oral temp 99.4.  I have added on chest x-ray urinalysis and COVID test to evaluate for infectious processes because of a change in mental status.  Labs are notable for slight leukocytosis to 11.7.  Urinalysis is grossly positive, with greater than 50 white cells moderate leuks positive  nitrites.  He did grow Klebsiella last about 5 months ago.  This was sensitive to Rocephin so we will cover with Rocephin.  I am ordering a bolus of fluid.  He is still pending an MRI but suspect he will likely need admission given his acute change in mental status.      FINAL CLINICAL IMPRESSION(S) / ED DIAGNOSES   Final diagnoses:  Acute encephalopathy  Urinary tract infection without hematuria, site unspecified     Rx / DC Orders   ED Discharge Orders     None        Note:  This document was prepared using Dragon voice recognition software and may include unintentional dictation errors.   [name], MD 05/14/23 [phone]

## 2023-05-14 NOTE — Code Documentation (Signed)
Stroke Response Nurse Documentation Code Documentation  Brian Dodson is a 67 y.o. male arriving to [facility] via [facility] on 05/14/2023 with past medical hx of DM, schizophrenia, HTN, CAD, a-fib, HLD, dementia, CKD. On Eliquis (apixaban) daily. Code stroke was activated by EMS.   Patient from [facility] where he was LKW at 1200 and now complaining of right sided facial droop and aphasia. EMS reports that patient was last witness at baseline by staff at 1200. Patient was noted to have right sided facial droop and not speaking as he normally does and EMS was called. LVO score 3 with EMS.   Stroke team at the bedside on patient arrival. Labs drawn and patient cleared for CT by Dr. Sidney Ace. Patient to CT with team. NIHSS 8, see documentation for details and code stroke times. Patient with decreased LOC, disoriented, not following commands, Global aphasia , and dysarthria  on exam. The following imaging was completed:  CT Head and CTA. Patient is not a candidate for IV Thrombolytic due to pt on Eliquis, per MD. Patient is not a candidate for IR due to no LVO per MD.   Care Plan: q2h NIHSS and vital signs, swallow screen per protocol.   Bedside handoff with ED RN [place].    Wille Glaser  Stroke Response RN

## 2023-05-15 ENCOUNTER — Other Ambulatory Visit: Payer: Self-pay

## 2023-05-15 DIAGNOSIS — N390 Urinary tract infection, site not specified: Secondary | ICD-10-CM

## 2023-05-15 DIAGNOSIS — I480 Paroxysmal atrial fibrillation: Secondary | ICD-10-CM | POA: Diagnosis not present

## 2023-05-15 DIAGNOSIS — N182 Chronic kidney disease, stage 2 (mild): Secondary | ICD-10-CM | POA: Diagnosis not present

## 2023-05-15 DIAGNOSIS — G9341 Metabolic encephalopathy: Secondary | ICD-10-CM | POA: Diagnosis not present

## 2023-05-15 LAB — LIPID PANEL
Cholesterol: 121 mg/dL (ref 0–200)
HDL: 64 mg/dL (ref >40)
LDL Cholesterol: 50 mg/dL (ref 0–99)
Total CHOL/HDL Ratio: 1.9 RATIO
Triglycerides: 33 mg/dL (ref <150)
VLDL: 7 mg/dL (ref 0–40)

## 2023-05-15 LAB — GLUCOSE, CAPILLARY
Glucose-Capillary: 159 mg/dL — ABNORMAL HIGH (ref 70–99)
Glucose-Capillary: 71 mg/dL (ref 70–99)
Glucose-Capillary: 76 mg/dL (ref 70–99)

## 2023-05-15 MED ORDER — CEPHALEXIN 500 MG PO CAPS: 500.0000 mg | ORAL_CAPSULE | Freq: Four times a day (QID) | ORAL | 0 refills | Status: AC

## 2023-05-15 NOTE — Progress Notes (Signed)
MRI negative for acute process. UA positive for UTI. Management of UTI per primary team. No further inpatient work up as presentation likely secondary to toxic metabolic encephalopathy in the setting of UTI and poor brain reserve Please call back with questions.   -- Milon Dikes, MD Neurologist [facility] Pager: [phone]

## 2023-05-15 NOTE — Evaluation (Signed)
Occupational Therapy Evaluation Patient Details Name: Brian Dodson MRN: [id number] DOB: 04/22/1956 Today's Date: 05/15/2023   History of Present Illness 67 y.o. male with medical history significant of hypertension, hyperlipidemia, diabetes mellitus, CAD with stent placement, GERD, vascular dementia, schizophrenia, CKD-2, atrial fibrillation on Eliquis, who presents with weakness and AMS.   Clinical Impression   Patient presenting with decreased Ind in self care, balance, functional mobility/transfers, endurance, and safety awareness. Patient reports living at [facility] with use of RW for functional mobility and some assistance for self care tasks as needed. Pt is very pleasant overall and follows commands with increased time. Pt found to be in bed saturated with urine. He stands with min A from EOB and uses incontinence wipes to cleanse self and then dons clean gown. Pt then ambulates with RW and min guard for safety 20' in room to recliner chair. NT arrives to assist pt further. Pt is likely close to baseline.Patient will benefit from acute OT to increase overall independence in the areas of ADLs, functional mobility, and safety awareness  in order to safely discharge.     Recommendations for follow up therapy are one component of a multi-disciplinary discharge planning process, led by the attending physician.  Recommendations may be updated based on patient status, additional functional criteria and insurance authorization.   Assistance Recommended at Discharge Frequent or constant Supervision/Assistance  Patient can return home with the following A little help with walking and/or transfers;A little help with bathing/dressing/bathroom;Assistance with cooking/housework;Assist for transportation;Help with stairs or ramp for entrance    Functional Status Assessment  Patient has had a recent decline in their functional status and demonstrates the ability to make significant improvements in  function in a reasonable and predictable amount of time.  Equipment Recommendations  None recommended by OT       Precautions / Restrictions Precautions Precautions: Fall      Mobility Bed Mobility Overal bed mobility: Needs Assistance Bed Mobility: Supine to Sit     Supine to sit: Supervision          Transfers Overall transfer level: Needs assistance Equipment used: Rolling walker (2 wheels) Transfers: Sit to/from Stand, Bed to chair/wheelchair/BSC Sit to Stand: Min assist     Step pivot transfers: Min guard            Balance Overall balance assessment: Needs assistance Sitting-balance support: Feet supported, Bilateral upper extremity supported Sitting balance-Leahy Scale: Good     Standing balance support: Reliant on assistive device for balance, During functional activity, Bilateral upper extremity supported Standing balance-Leahy Scale: Fair                             ADL either performed or assessed with clinical judgement   ADL Overall ADL's : Needs assistance/impaired         Upper Body Bathing: Supervision/ safety;Standing   Lower Body Bathing: Min guard;Minimal assistance;Sit to/from stand   Upper Body Dressing : Set up;Sitting Upper Body Dressing Details (indicate cue type and reason): to don hospital gown     Toilet Transfer: Min guard;Rolling walker (2 wheels)                   Vision Patient Visual Report: No change from baseline              Pertinent Vitals/Pain Pain Assessment Pain Assessment: No/denies pain     Hand Dominance Right   Extremity/Trunk Assessment  Upper Extremity Assessment Upper Extremity Assessment: Overall WFL for tasks assessed   Lower Extremity Assessment Lower Extremity Assessment: Generalized weakness       Communication Communication Communication: No difficulties   Cognition Arousal/Alertness: Awake/alert Behavior During Therapy: WFL for tasks  assessed/performed Overall Cognitive Status: History of cognitive impairments - at baseline                                 General Comments: hx of vascular dementia but pt follows commands with increased time to process and is pleasant. Pt has been urinating on himself in bed.                Home Living Family/patient expects to be discharged to:: Assisted living ([facility])   Available Help at Discharge: Available 24 hours/day Type of Home: Assisted living       Home Layout: One level         Bathroom Toilet: Handicapped height     Home Equipment: Agricultural consultant (2 wheels)          Prior Functioning/Environment               Mobility Comments: Pt reports use of RW ADLs Comments: supervision/ assist as needed from ALF ( from prior admission 4 months ago)        OT Problem List: Decreased strength;Decreased activity tolerance;Decreased safety awareness;Impaired balance (sitting and/or standing);Decreased knowledge of use of DME or AE      OT Treatment/Interventions: Self-care/ADL training;Therapeutic exercise;Therapeutic activities;Energy conservation;DME and/or AE instruction;Patient/family education;Balance training    OT Goals(Current goals can be found in the care plan section) Acute Rehab OT Goals Patient Stated Goal: to feel better OT Goal Formulation: With patient Time For Goal Achievement: 05/29/23 Potential to Achieve Goals: Fair ADL Goals Pt Will Perform Grooming: with supervision;standing Pt Will Perform Lower Body Dressing: with supervision;sit to/from stand Pt Will Transfer to Toilet: with supervision;ambulating Pt Will Perform Toileting - Clothing Manipulation and hygiene: with supervision;sit to/from stand  OT Frequency: Min 2X/week       AM-PAC OT "6 Clicks" Daily Activity     Outcome Measure Help from another person eating meals?: None Help from another person taking care of personal grooming?: A Little Help from  another person toileting, which includes using toliet, bedpan, or urinal?: A Little Help from another person bathing (including washing, rinsing, drying)?: A Little Help from another person to put on and taking off regular upper body clothing?: None Help from another person to put on and taking off regular lower body clothing?: A Little 6 Click Score: 20   End of Session Equipment Utilized During Treatment: Rolling walker (2 wheels) Nurse Communication: Mobility status  Activity Tolerance: Patient tolerated treatment well Patient left: in chair;with chair alarm set;with call bell/phone within reach;with nursing/sitter in room  OT Visit Diagnosis: Unsteadiness on feet (R26.81);Muscle weakness (generalized) (M62.81);History of falling (Z91.81)                Time: 1914-7829 OT Time Calculation (min): 16 min Charges:  OT General Charges $OT Visit: 1 Visit OT Evaluation $OT Eval Low Complexity: 1 Low OT Treatments $Self Care/Home Management : 8-22 mins  Jackquline Denmark, MS, OTR/L , CBIS ascom [phone]  05/15/23, 11:59 AM

## 2023-05-15 NOTE — Progress Notes (Addendum)
SLP Cancellation Note  Patient Details Name: Brian Dodson MRN: [id number] DOB: 1956-03-10   Cancelled treatment:       Reason Eval/Treat Not Completed: SLP screened, no needs identified, will sign off (chart reviewed; consulted NSG and met w/ pt in room)  Per chart and H&P, pt has medical history including Vascular Dementia, schizophrenia. He presented w/ weakness and AMS; neurological work-up: MRI- No acute intracranial process. 2. 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 At baseline, he is able to walk, bathe himself and feed himself -- this morning, pt is feeding himself his drinks and assisting w/ Pills w/ NSG. Neurology note indicates "suspected toxic metabolic encephalopathy".   Pt gave his DOB. He denied any difficulty swallowing and is currently on a regular diet; tolerates swallowing pills w/ water per NSG -- he swallowed 3 at a time w/ water w/ NSG while in room.  Pt conversed in basic/simple conversation and engagement w/ this SLP and NSG making his wants/needs known; he followed basic, 1-step commands w/ verbal/tactile cues. Speech was mumbled/muttered but grossly intelligible for wants/needs. Pt is missing Dentition also.   Pt appears to exhibit functional follow through w/ basic communication and self-feeding/tasks this morning. Communication is basic but functional for wants/needs. No further Acute skilled ST services indicated currently, but if he presents differently from his Baseline post return home to his structured setting, then his PCP can order f/u ST services. NSG and MD updated; agreed. MD to reconsult if any new change in status while admitted. W/ dx'd Vascular Dementia at Baseline, he needs 24/7 Supervision in the home.     [name], MS, CCC-SLP Speech Language Pathologist Rehab Services; [facility] [phone] (ascom) [name] 05/15/2023, 8:55 AM

## 2023-05-15 NOTE — Progress Notes (Signed)
Pt called to facility RN spoke to [facility]

## 2023-05-15 NOTE — Evaluation (Signed)
Physical Therapy Evaluation Patient Details Name: Brian Dodson MRN: [id number] DOB: 04-30-1956 Today's Date: 05/15/2023  History of Present Illness  Pt is a 67 y.o. male with PMH that includes: hypertension, hyperlipidemia, diabetes mellitus, CAD with stent placement, GERD, vascular dementia, schizophrenia, CKD-2, atrial fibrillation on Eliquis, who presents with weakness, UTI, and AMS.   Clinical Impression  Pt was pleasant and motivated to participate during the session and put forth good effort throughout. Pt unable to provide reliable history but was able to follow most 1-step commands with extra time and cuing.  Pt was generally steady with transfers and gait with no overt LOB but required cuing for sequencing for increased safety regarding proper use of the RW.  Pt reported no adverse symptoms during the session with SpO2 and HR WNL on room air.  Pt will benefit from continued PT services upon discharge to safely address deficits listed in patient problem list for decreased caregiver assistance and eventual return to PLOF.         Recommendations for follow up therapy are one component of a multi-disciplinary discharge planning process, led by the attending physician.  Recommendations may be updated based on patient status, additional functional criteria and insurance authorization.  Follow Up Recommendations       Assistance Recommended at Discharge Frequent or constant Supervision/Assistance  Patient can return home with the following  A little help with walking and/or transfers;A little help with bathing/dressing/bathroom;Assistance with cooking/housework;Direct supervision/assist for medications management;Assist for transportation    Equipment Recommendations Rolling walker (2 wheels);BSC/3in1 (Unknown if pt has access to this DME at ALF)  Recommendations for Other Services       Functional Status Assessment Patient has had a recent decline in their functional status and  demonstrates the ability to make significant improvements in function in a reasonable and predictable amount of time.     Precautions / Restrictions Precautions Precautions: Fall Restrictions Weight Bearing Restrictions: No      Mobility  Bed Mobility Overal bed mobility: Modified Independent             General bed mobility comments: Min extra time and effort only    Transfers Overall transfer level: Needs assistance Equipment used: Rolling walker (2 wheels) Transfers: Sit to/from Stand Sit to Stand: Supervision           General transfer comment: Pt able to stand from EOB and recliner without physical assist or LOB; min extra time/effort to come to standing along with use of BUEs required    Ambulation/Gait Ambulation/Gait assistance: Min guard Gait Distance (Feet): 50 Feet Assistive device: Rolling walker (2 wheels) Gait Pattern/deviations: Step-through pattern, Decreased step length - left, Decreased step length - right, Trunk flexed Gait velocity: decreased     General Gait Details: Pt ambulated with significant trunk flexion and slow cadence with cues given for upright posture and amb closer to the RW but very poor carryover, no overt LOB  Stairs            Wheelchair Mobility    Modified Rankin (Stroke Patients Only)       Balance Overall balance assessment: Needs assistance Sitting-balance support: Feet supported, Bilateral upper extremity supported Sitting balance-Leahy Scale: Good     Standing balance support: Reliant on assistive device for balance, During functional activity, Bilateral upper extremity supported Standing balance-Leahy Scale: Fair  Pertinent Vitals/Pain Pain Assessment Pain Assessment: No/denies pain    Home Living Family/patient expects to be discharged to:: Other (Comment) ([facility])   Available Help at Discharge: Available 24 hours/day Type of Home: Assisted  living         Home Layout: One level Home Equipment: Agricultural consultant (2 wheels)      Prior Function Prior Level of Function : Patient poor historian/Family not available             Mobility Comments: Unable to obtain reliable history secondary to pt being a poor historian ADLs Comments: supervision/ assist as needed from ALF ( from prior admission 4 months ago)     Hand Dominance   Dominant Hand: Right    Extremity/Trunk Assessment   Upper Extremity Assessment Upper Extremity Assessment: Overall WFL for tasks assessed    Lower Extremity Assessment Lower Extremity Assessment: Generalized weakness       Communication   Communication: No difficulties  Cognition Arousal/Alertness: Awake/alert Behavior During Therapy: WFL for tasks assessed/performed Overall Cognitive Status: No family/caregiver present to determine baseline cognitive functioning                                 General Comments: History of dementia, pt able to follow most simple 1-step commands with increased time and cuing to process        General Comments      Exercises     Assessment/Plan    PT Assessment Patient needs continued PT services  PT Problem List Decreased strength;Decreased activity tolerance;Decreased balance;Decreased mobility;Decreased knowledge of use of DME       PT Treatment Interventions DME instruction;Gait training;Functional mobility training;Therapeutic activities;Therapeutic exercise;Balance training;Patient/family education    PT Goals (Current goals can be found in the Care Plan section)  Acute Rehab PT Goals Patient Stated Goal: to return home PT Goal Formulation: With patient Time For Goal Achievement: 05/28/23 Potential to Achieve Goals: Good    Frequency Min 2X/week     Co-evaluation               AM-PAC PT "6 Clicks" Mobility  Outcome Measure Help needed turning from your back to your side while in a flat bed without using  bedrails?: None Help needed moving from lying on your back to sitting on the side of a flat bed without using bedrails?: None Help needed moving to and from a bed to a chair (including a wheelchair)?: A Little Help needed standing up from a chair using your arms (e.g., wheelchair or bedside chair)?: A Little Help needed to walk in hospital room?: A Little Help needed climbing 3-5 steps with a railing? : A Little 6 Click Score: 20    End of Session Equipment Utilized During Treatment: Gait belt Activity Tolerance: Patient tolerated treatment well Patient left: in bed;with bed alarm set;with call bell/phone within reach;Other (comment) (Pt declined up in chair) Nurse Communication: Mobility status PT Visit Diagnosis: Difficulty in walking, not elsewhere classified (R26.2);Muscle weakness (generalized) (M62.81)    Time: 0865-7846 PT Time Calculation (min) (ACUTE ONLY): 18 min   Charges:   PT Evaluation $PT Eval Low Complexity: 1 Low        D. Scott Lenton Gendreau PT, DPT 05/15/23, 1:55 PM

## 2023-05-15 NOTE — Progress Notes (Signed)
Guardian Myriam Forehand aware of pt discharge, copy of AVS sent to fax for [place].

## 2023-05-15 NOTE — Discharge Summary (Signed)
Physician Discharge Summary   Patient: Brian Dodson MRN: [id number] DOB: 1956-04-01  Admit date:     05/14/2023  Discharge date: 05/15/23  Discharge Physician: Arnetha Courser   PCP: Galvin Proffer, MD   Recommendations at discharge:  Please follow-up final urine culture results Patient is being discharged on Keflex-please ensure he completes the course, he might need a change of antibiotics based on final urine culture results Follow-up with primary care provider within a week  Discharge Diagnoses: Principal Problem:   Acute metabolic encephalopathy Active Problems:   UTI (urinary tract infection)   CAD (coronary artery disease)   CKD (chronic kidney disease) stage 2, GFR 60-89 ml/min   HLD (hyperlipidemia)   PAF (paroxysmal atrial fibrillation) (HCC)   Schizophrenia (HCC)   Depression   Type II diabetes mellitus with renal manifestations (HCC)   Dementia [facility])   Hospital Course: Taken from H&P.  Brian Dodson is a 67 y.o. male with medical history significant of hypertension, hyperlipidemia, diabetes mellitus, CAD with stent placement, GERD, vascular dementia, schizophrenia, CKD-2, atrial fibrillation on Eliquis, who presents with weakness and AMS.   Per her niece at the bedside, at her normal baseline, patient recognizes family members, knows his living place, but not oriented to the time.   Data reviewed independently and ED Course: pt was found to have WBC 11.7, positive urinalysis (hazy appearance, moderate amount leukocyte, positive nitrite, many bacteria, WBC> 50), negative COVID PCR, GFR> 60, temperature 99.4, blood pressure 103/64, heart rate 85, RR 20, oxygen saturation 96% on room air.  Chest x-ray negative.  CT of head is negative.  CTA of head and neck negative for LVO (CTA is a suboptimal study)  MRI brain was also negative for any acute intracranial process. Did show hemosiderin deposition, compatible with sequela of prior subarachnoid hemorrhage with some  additional areas in the left temporal lobe and left occipital lobe, consistent with interval hemorrhage.  Patient was admitted for acute toxic metabolic encephalopathy, likely secondary to UTI.  He was started on Rocephin.  Urology was also consulted and patient does not need any further inpatient workup.  6/26: Labs and vital stable.  Patient appears to be at his baseline.  He is slow in response but answering questions appropriately.  Has an history of prior UTI in January with Klebsiella pneumonia, only resistant to ampicillin.  He received ceftriaxone while in the hospital and is being discharged on Keflex based on prior culture results as recent culture is still pending.  Over PT and OT also evaluated him and recommended home health which was ordered.  Patient will continue the rest of his home medications and follow-up with his providers for further recommendations.     Consultants: None Procedures performed: None Disposition: Assisted living Diet recommendation:  Discharge Diet Orders (From admission, onward)     Start     Ordered   05/15/23 0000  Diet - low sodium heart healthy        05/15/23 1128           Cardiac and Carb modified diet DISCHARGE MEDICATION: Allergies as of 05/15/2023       Reactions   Sulfa Antibiotics Other (See Comments)   Unknown reaction   Xarelto [rivaroxaban]         Medication List     STOP taking these medications    leptospermum manuka honey Pste paste       TAKE these medications    acetaminophen 500 MG tablet Commonly known  as: TYLENOL Take 1,000 mg by mouth every 6 (six) hours as needed for mild pain.   amiodarone 200 MG tablet Commonly known as: PACERONE Take 1 tablet (200 mg total) by mouth daily.   aspirin 81 MG tablet Take 81 mg by mouth daily.   atorvastatin 10 MG tablet Commonly known as: LIPITOR Take 10 mg by mouth daily.   benztropine 1 MG tablet Commonly known as: COGENTIN Take 1 mg by mouth at  bedtime.   Biofreeze 4 % Gel Generic drug: Menthol (Topical Analgesic) Apply 1 application topically 3 (three) times daily. To lower back area as needed for pain   brimonidine-timolol 0.2-0.5 % ophthalmic solution Commonly known as: COMBIGAN Place 1 drop into both eyes every 12 (twelve) hours. Wait 3 to 5 minutes between drops   cephALEXin 500 MG capsule Commonly known as: KEFLEX Take 1 capsule (500 mg total) by mouth 4 (four) times daily for 5 days.   citalopram 20 MG tablet Commonly known as: CELEXA Take 20 mg by mouth daily.   divalproex 500 MG 24 hr tablet Commonly known as: DEPAKOTE ER Take 2 tablets (1,000 mg total) by mouth 2 (two) times daily.   donepezil 10 MG tablet Commonly known as: ARICEPT Take 10 mg by mouth at bedtime.   Eliquis 5 MG Tabs tablet Generic drug: apixaban Take 5 mg by mouth 2 (two) times daily.   ferrous sulfate 325 (65 FE) MG tablet Take 1 tablet (325 mg total) by mouth daily with breakfast. (Hold for 1 week, resume on 06/25/22)   lanolin/mineral oil Lotn Apply 1 application topically as needed for dry skin. Apply to face after washing face daily   loratadine 10 MG tablet Commonly known as: CLARITIN Take 10 mg by mouth daily.   Lumigan 0.01 % Soln Generic drug: bimatoprost Place 1 drop into both eyes at bedtime.   Magnesium Oxide 400 MG Caps Take 1 capsule (400 mg total) by mouth daily. (Hold for 1 week, resume on 06/25/22)   melatonin 5 MG Tabs Take 5 mg by mouth at bedtime.   metFORMIN 500 MG tablet Commonly known as: GLUCOPHAGE Take 500 mg by mouth 2 (two) times daily with a meal.   Minerin Creme Crea Apply 1 Application topically in the morning. Apply to arms and legs   omeprazole 20 MG capsule Commonly known as: PRILOSEC Take 20 mg by mouth 2 (two) times daily before a meal. May open and sprinkle in applesauce.   oyster calcium 500 MG Tabs tablet Take 500 mg of elemental calcium by mouth 2 (two) times daily.   risperidone 4  MG tablet Commonly known as: RISPERDAL Take 4 mg by mouth at bedtime.   sodium chloride 0.65 % Soln nasal spray Commonly known as: OCEAN Place 1 spray into both nostrils daily.   zinc oxide 20 % ointment Apply 1 Application topically 2 (two) times daily as needed for irritation (to left buttock).        Follow-up Information     Hague, Myrene Galas, MD. Schedule an appointment as soon as possible for a visit in 1 week(s).   Specialty: Internal Medicine Contact information: [address] [place] [address] [phone]                Discharge Exam: Ceasar Mons Weights   05/14/23 1419 05/14/23 2145  Weight: 107.9 kg 106.5 kg   General.  Frail gentleman, in no acute distress. Pulmonary.  Lungs clear bilaterally, normal respiratory effort. CV.  Regular rate  and rhythm, no JVD, rub or murmur. Abdomen.  Soft, nontender, nondistended, BS positive. CNS.  Alert and oriented x 2, slow response and no new focal deficit. Extremities.  No edema, no cyanosis, pulses intact and symmetrical. Psychiatry.  Appears to have cognitive impairment  Condition at discharge: stable  The results of significant diagnostics from this hospitalization (including imaging, microbiology, ancillary and laboratory) are listed below for reference.   Imaging Studies: MR BRAIN WO CONTRAST  Result Date: 05/14/2023 CLINICAL DATA:  Altered mental status EXAM: MRI HEAD WITHOUT CONTRAST TECHNIQUE: Multiplanar, multiecho pulse sequences of the brain and surrounding structures were obtained without intravenous contrast. COMPARISON:  03/03/2021 MRI head, correlation is also made with 05/14/2023 CT head FINDINGS: Brain: No restricted diffusion to suggest acute or subacute infarct. No acute hemorrhage, mass, mass effect, or midline shift. No hydrocephalus or extra-axial collection. Partial empty sella. Normal craniocervical junction. Hemosiderin deposition in bilateral cerebral sulci (for example series 13, image  42, 28, 21), compatible with sequela of prior subarachnoid hemorrhage and superficial siderosis, some of which are new from the 2022 MRI. Additional areas of hemosiderin deposition in the left temporal lobe and left occipital lobe, which are new from the prior exam, with redemonstrated hemosiderin deposition in the vermis and right cerebellum. Advanced cerebral volume loss for age. This is most pronounced in the left temporal lobe. Confluent T2 hyperintense signal in the periventricular white matter, likely the sequela of moderate chronic small vessel ischemic disease. Vascular: Normal arterial flow voids. Skull and upper cervical spine: Normal marrow signal. Sinuses/Orbits: Clear paranasal sinuses. No acute finding in the orbits. Status post bilateral lens replacements. Other: The mastoid air cells are well aerated. IMPRESSION: 1. No acute intracranial process. 2. Hemosiderin deposition in bilateral cerebral sulci, compatible with sequela of prior subarachnoid hemorrhage and superficial siderosis, some of which are new from the 2022 MRI. Additional areas of hemosiderin deposition in the left temporal lobe and left occipital lobe are also new from the prior exam, consistent with interval hemorrhage. Electronically Signed   By: Wiliam Ke M.D.   On: 05/14/2023 20:05   DG Chest Portable 1 View  Result Date: 05/14/2023 CLINICAL DATA:  Shortness of breath EXAM: PORTABLE CHEST 1 VIEW COMPARISON:  X-ray 12/17/2022 and older FINDINGS: Underinflated x-ray. Borderline cardiopericardial silhouette. No consolidation, pneumothorax or effusion. No edema. Overlapping cardiac leads. Degenerative changes along the spine. IMPRESSION: No acute cardiopulmonary disease. Electronically Signed   By: Karen Kays M.D.   On: 05/14/2023 15:25   CT ANGIO HEAD NECK W WO CM (CODE STROKE)  Result Date: 05/14/2023 CLINICAL DATA:  Stroke. EXAM: CT ANGIOGRAPHY HEAD AND NECK WITH AND WITHOUT CONTRAST TECHNIQUE: Multidetector CT imaging  of the head and neck was performed using the standard protocol during bolus administration of intravenous contrast. Multiplanar CT image reconstructions and MIPs were obtained to evaluate the vascular anatomy. Carotid stenosis measurements (when applicable) are obtained utilizing NASCET criteria, using the distal internal carotid diameter as the denominator. RADIATION DOSE REDUCTION: This exam was performed according to the departmental dose-optimization program which includes automated exposure control, adjustment of the mA and/or kV according to patient size and/or use of iterative reconstruction technique. CONTRAST:  50mL OMNIPAQUE IOHEXOL 350 MG/ML SOLN COMPARISON:  CTA head 01/29/2014.  MRI head 03/03/2021. FINDINGS: CTA NECK FINDINGS Aortic arch: Standard 3 vessel aortic arch with widely patent arch vessel origins. Right carotid system: Patent without evidence of stenosis or dissection. Left carotid system: Patent with a small amount of calcified plaque at  the carotid bifurcation. No evidence of a significant stenosis or dissection. Vertebral arteries: The right vertebral artery is patent and strongly dominant without evidence of stenosis or dissection. The left vertebral artery arises from the proximal most aspect of the left subclavian artery and is diffusely hypoplastic but patent. 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 Moderate cervical spondylosis and asymmetric right-sided facet arthrosis. Other neck: No evidence of cervical lymphadenopathy or mass. Upper chest: Clear lung apices. Review of the MIP images confirms the above findings CTA HEAD FINDINGS Suboptimal arterial opacification limits assessment, particularly of the small and medium-sized arteries. Anterior circulation: The internal carotid arteries are patent from skull base to carotid termini with mild atherosclerotic plaque bilaterally and no suspected high-grade stenosis  although assessment is limited by study quality. ACAs and MCAs are patent without evidence a proximal branch occlusion within study limitations. No aneurysm is identified. Posterior circulation: The intracranial vertebral arteries are patent to the basilar with the right being strongly dominant. The basilar artery is patent with an apparent punctate filling defect proximally attributed to artifact given similar findings in other similarly sized vessels (such as the right V4 segment). There are patent posterior communicating arteries bilaterally. The PCAs are patent with detailed assessment for stenosis precluded by poor study quality. No aneurysm is identified. Venous sinuses: As permitted by contrast timing, patent. Anatomic variants: None. Review of the MIP images confirms the above findings These results were communicated to Dr. Wilford Corner at 2:23 pm on 05/14/2023 by text page via the [facility] messaging system. IMPRESSION: 1. Suboptimal CTA without evidence of a large vessel occlusion. 2. Mild cervical carotid artery atherosclerosis without stenosis. Electronically Signed   By: Sebastian Ache M.D.   On: 05/14/2023 14:36   CT HEAD CODE STROKE WO CONTRAST  Result Date: 05/14/2023 CLINICAL DATA:  Code stroke. Neuro deficit, acute, stroke suspected. Altered mental status. Right facial droop. EXAM: CT HEAD WITHOUT CONTRAST TECHNIQUE: Contiguous axial images were obtained from the base of the skull through the vertex without intravenous contrast. RADIATION DOSE REDUCTION: This exam was performed according to the departmental dose-optimization program which includes automated exposure control, adjustment of the mA and/or kV according to patient size and/or use of iterative reconstruction technique. COMPARISON:  Head CT 12/18/2022 FINDINGS: Brain: A small hypodensity in the left paracentral pons is favored to reflect skull base artifact on reformats. No acute supratentorial infarct, intracranial hemorrhage, mass, midline shift,  or extra-axial fluid collection is identified. Patchy hypodensities in the cerebral white matter, greatest in the left parietal lobe, are unchanged and nonspecific but compatible with moderate chronic small vessel ischemic disease. There is mild-to-moderate cerebral atrophy including asymmetric volume loss in the left temporal lobe. Vascular: Calcified atherosclerosis at the skull base. No hyperdense vessel. Skull: No acute fracture or suspicious osseous lesion. Sinuses/Orbits: Paranasal sinuses and mastoid air cells are clear. Bilateral cataract extraction. Other: None. ASPECTS ([place] Stroke Program Early CT Score) - Ganglionic level infarction (caudate, lentiform nuclei, internal capsule, insula, M1-M3 cortex): 7 - Supraganglionic infarction (M4-M6 cortex): 3 Total score (0-10 with 10 being normal): 10 These results were communicated to Dr. Wilford Corner at 2:23 pm on 05/14/2023 by text page via the [facility] messaging system. IMPRESSION: 1. No evidence of acute intracranial abnormality. ASPECTS of 10. 2. Moderate chronic small vessel ischemic disease. Electronically Signed   By: Sebastian Ache M.D.   On: 05/14/2023 14:24    Microbiology: Results for orders placed or  performed during the hospital encounter of 05/14/23  SARS Coronavirus 2 by RT PCR (hospital order, performed in [facility] hospital lab) *cepheid single result test* Anterior Nasal Swab     Status: None   Collection Time: 05/14/23  2:43 PM   Specimen: Anterior Nasal Swab  Result Value Ref Range Status   SARS Coronavirus 2 by RT PCR NEGATIVE NEGATIVE Final    Comment: (NOTE) SARS-CoV-2 target nucleic acids are NOT DETECTED.  The SARS-CoV-2 RNA is generally detectable in upper and lower respiratory specimens during the acute phase of infection. The lowest concentration of SARS-CoV-2 viral copies this assay can detect is 250 copies / mL. A negative result does not preclude SARS-CoV-2 infection and should not be used as the sole basis for treatment or  other patient management decisions.  A negative result may occur with improper specimen collection / handling, submission of specimen other than nasopharyngeal swab, presence of viral mutation(s) within the areas targeted by this assay, and inadequate number of viral copies (<250 copies / mL). A negative result must be combined with clinical observations, patient history, and epidemiological information.  Fact Sheet for Patients:   RoadLapTop.co.za  Fact Sheet for Healthcare Providers: http://kim-miller.com/  This test is not yet approved or  cleared by the [place] FDA and has been authorized for detection and/or diagnosis of SARS-CoV-2 by FDA under an Emergency Use Authorization (EUA).  This EUA will remain in effect (meaning this test can be used) for the duration of the COVID-19 declaration under Section 564(b)(1) of the Act, 21 U.S.C. section 360bbb-3(b)(1), unless the authorization is terminated or revoked sooner.  Performed at [facility], [address] [place], [place] [address]     Labs: CBC: Recent Labs  Lab 05/14/23 1402  WBC 11.7*  NEUTROABS 8.5*  HGB 10.9*  HCT 34.0*  MCV 99.7  PLT 146*   Basic Metabolic Panel: Recent Labs  Lab 05/14/23 1402  NA 136  K 4.2  CL 104  CO2 25  GLUCOSE 147*  BUN 25*  CREATININE 1.19  CALCIUM 8.9   Liver Function Tests: Recent Labs  Lab 05/14/23 1402  AST 22  ALT 13  ALKPHOS 39  BILITOT 0.6  PROT 6.8  ALBUMIN 3.4*   CBG: Recent Labs  Lab 05/14/23 2236 05/15/23 0746 05/15/23 1156  GLUCAP 72 71 76    Discharge time spent: greater than 30 minutes.  This record has been created using Conservation officer, historic buildings. Errors have been sought and corrected,but may not always be located. Such creation errors do not reflect on the standard of care.   Signed: Arnetha Courser, MD [facility] 05/15/2023

## 2023-05-15 NOTE — TOC Progression Note (Signed)
Transition of Care [facility]) - Progression Note    Patient Details  Name: Brian Dodson MRN: [id number] Date of Birth: December 24, 1955  Transition of [facility]) CM/SW Contact  Allena Katz, LCSW Phone Number: 05/15/2023, 11:10 AM  Clinical Narrative:   CSW spoke with Tammy at [facility] who reports she can take him back. No FL2 needed. Tammy would like DC summary emailed. TOC to email. Tammy agreeable to [facility] and reports no preference. Referral given to [facility] for PT/OT/RN. Tammy requesting EMS transport for patient.          Expected Discharge Plan and Services                                               Social Determinants of Health (SDOH) Interventions SDOH Screenings   Food Insecurity: Patient Unable To Answer (05/14/2023)  Housing: Patient Unable To Answer (05/14/2023)  Transportation Needs: Patient Unable To Answer (05/14/2023)  Utilities: Patient Unable To Answer (05/14/2023)  Tobacco Use: Medium Risk (05/14/2023)    Readmission Risk Interventions    02/14/2022    1:19 PM 09/19/2021    3:16 PM  Readmission Risk Prevention Plan  Transportation Screening Complete Complete  PCP or Specialist Appt within 3-5 Days  Complete  HRI or Home Care Consult  Complete  Social Work Consult for Recovery Care Planning/Counseling  Complete  Palliative Care Screening  Not Applicable  Medication Review Oceanographer)  Complete  HRI or Home Care Consult Complete   SW Recovery Care/Counseling Consult Complete   Palliative Care Screening Not Applicable   Skilled Nursing Facility Not Applicable

## 2023-05-15 NOTE — TOC Transition Note (Addendum)
Transition of Care [facility]) - CM/SW Discharge Note   Patient Details  Name: Brian Dodson MRN: [id number] Date of Birth: 19-Feb-1956  Transition of Care [facility]) CM/SW Contact:  Allena Katz, LCSW Phone Number: 05/15/2023, 12:40 PM   Clinical Narrative:   CSW spoke with Tammy at [facility] who reports pt is able to return today. Dc summary sent via email. Tammy requesting EMS transport.  Ems forms printed. Ems called patient is second on the list. Tammy notified. Barbara Cower with [facility] notified.     Final next level of care: Assisted Living Barriers to Discharge: Barriers Resolved   Patient Goals and CMS Choice CMS Medicare.gov Compare Post Acute Care list provided to:: Patient    Discharge Placement                Patient chooses bed at:  ([facility]) Patient to be transferred to facility by: [facility]   Patient and family notified of of transfer: 05/15/23  Discharge Plan and Services Additional resources added to the After Visit Summary for                            [facility] Arranged: PT, OT, RN [facility] Agency: [facility] ([facility]) Date HH Agency Contacted: 05/15/23 Time HH Agency Contacted: 1000    Social Determinants of Health (SDOH) Interventions SDOH Screenings   Food Insecurity: Patient Unable To Answer (05/14/2023)  Housing: Patient Unable To Answer (05/14/2023)  Transportation Needs: Patient Unable To Answer (05/14/2023)  Utilities: Patient Unable To Answer (05/14/2023)  Tobacco Use: Medium Risk (05/14/2023)     Readmission Risk Interventions    02/14/2022    1:19 PM 09/19/2021    3:16 PM  Readmission Risk Prevention Plan  Transportation Screening Complete Complete  PCP or Specialist Appt within 3-5 Days  Complete  HRI or Home Care Consult  Complete  Social Work Consult for Recovery Care Planning/Counseling  Complete  Palliative Care Screening  Not Applicable  Medication Review Oceanographer)  Complete  HRI or Home Care Consult Complete   SW  Recovery Care/Counseling Consult Complete   Palliative Care Screening Not Applicable   Skilled Nursing Facility Not Applicable

## 2023-05-15 NOTE — Hospital Course (Addendum)
Taken from H&P.  Brian Dodson is a 67 y.o. male with medical history significant of hypertension, hyperlipidemia, diabetes mellitus, CAD with stent placement, GERD, vascular dementia, schizophrenia, CKD-2, atrial fibrillation on Eliquis, who presents with weakness and AMS.   Per her niece at the bedside, at her normal baseline, patient recognizes family members, knows his living place, but not oriented to the time.   Data reviewed independently and ED Course: pt was found to have WBC 11.7, positive urinalysis (hazy appearance, moderate amount leukocyte, positive nitrite, many bacteria, WBC> 50), negative COVID PCR, GFR> 60, temperature 99.4, blood pressure 103/64, heart rate 85, RR 20, oxygen saturation 96% on room air.  Chest x-ray negative.  CT of head is negative.  CTA of head and neck negative for LVO (CTA is a suboptimal study)  MRI brain was also negative for any acute intracranial process. Did show hemosiderin deposition, compatible with sequela of prior subarachnoid hemorrhage with some additional areas in the left temporal lobe and left occipital lobe, consistent with interval hemorrhage.  Patient was admitted for acute toxic metabolic encephalopathy, likely secondary to UTI.  He was started on Rocephin.  Urology was also consulted and patient does not need any further inpatient workup.  6/26: Labs and vital stable.  Patient appears to be at his baseline.  He is slow in response but answering questions appropriately.  Has an history of prior UTI in January with Klebsiella pneumonia, only resistant to ampicillin.  He received ceftriaxone while in the hospital and is being discharged on Keflex based on prior culture results as recent culture is still pending.  Over PT and OT also evaluated him and recommended home health which was ordered.  Patient will continue the rest of his home medications and follow-up with his providers for further recommendations.

## 2023-05-16 LAB — HEMOGLOBIN A1C
Hgb A1c MFr Bld: 5.9 % — ABNORMAL HIGH (ref 4.8–5.6)
Mean Plasma Glucose: 123 mg/dL

## 2023-05-16 LAB — URINE CULTURE: Culture: >=100000 — AB

## 2023-06-06 ENCOUNTER — Emergency Department
Admission: EM | Admit: 2023-06-06 | Discharge: 2023-06-06 | Disposition: A | Discharge status: Other Acute Inpatient Hospital | Payer: Medicare Other | Attending: Emergency Medicine | Admitting: Emergency Medicine

## 2023-06-06 ENCOUNTER — Inpatient Hospital Stay (HOSPITAL_COMMUNITY)
Admission: EM | Admit: 2023-06-06 | Discharge: 2023-06-20 | DRG: 064 | Disposition: E | Payer: Medicare Other | Source: Other Acute Inpatient Hospital | Attending: Neurology | Admitting: Neurology

## 2023-06-06 ENCOUNTER — Emergency Department: Payer: Medicare Other

## 2023-06-06 ENCOUNTER — Inpatient Hospital Stay (HOSPITAL_COMMUNITY): Payer: Medicare Other

## 2023-06-06 ENCOUNTER — Encounter (HOSPITAL_COMMUNITY): Payer: Self-pay

## 2023-06-06 DIAGNOSIS — I4891 Unspecified atrial fibrillation: Secondary | ICD-10-CM | POA: Diagnosis present

## 2023-06-06 DIAGNOSIS — I6389 Other cerebral infarction: Secondary | ICD-10-CM | POA: Diagnosis not present

## 2023-06-06 DIAGNOSIS — J4489 Other specified chronic obstructive pulmonary disease: Secondary | ICD-10-CM | POA: Diagnosis present

## 2023-06-06 DIAGNOSIS — K219 Gastro-esophageal reflux disease without esophagitis: Secondary | ICD-10-CM | POA: Insufficient documentation

## 2023-06-06 DIAGNOSIS — E1165 Type 2 diabetes mellitus with hyperglycemia: Secondary | ICD-10-CM | POA: Diagnosis present

## 2023-06-06 DIAGNOSIS — I611 Nontraumatic intracerebral hemorrhage in hemisphere, cortical: Principal | ICD-10-CM | POA: Diagnosis present

## 2023-06-06 DIAGNOSIS — R569 Unspecified convulsions: Secondary | ICD-10-CM

## 2023-06-06 DIAGNOSIS — G936 Cerebral edema: Secondary | ICD-10-CM | POA: Diagnosis present

## 2023-06-06 DIAGNOSIS — I129 Hypertensive chronic kidney disease with stage 1 through stage 4 chronic kidney disease, or unspecified chronic kidney disease: Secondary | ICD-10-CM | POA: Diagnosis not present

## 2023-06-06 DIAGNOSIS — R4182 Altered mental status, unspecified: Secondary | ICD-10-CM | POA: Diagnosis not present

## 2023-06-06 DIAGNOSIS — Z7901 Long term (current) use of anticoagulants: Secondary | ICD-10-CM | POA: Insufficient documentation

## 2023-06-06 DIAGNOSIS — N189 Chronic kidney disease, unspecified: Secondary | ICD-10-CM | POA: Diagnosis present

## 2023-06-06 DIAGNOSIS — E785 Hyperlipidemia, unspecified: Secondary | ICD-10-CM | POA: Diagnosis not present

## 2023-06-06 DIAGNOSIS — G931 Anoxic brain damage, not elsewhere classified: Secondary | ICD-10-CM | POA: Diagnosis present

## 2023-06-06 DIAGNOSIS — Z7189 Other specified counseling: Secondary | ICD-10-CM | POA: Diagnosis not present

## 2023-06-06 DIAGNOSIS — Z515 Encounter for palliative care: Secondary | ICD-10-CM | POA: Diagnosis not present

## 2023-06-06 DIAGNOSIS — G8194 Hemiplegia, unspecified affecting left nondominant side: Secondary | ICD-10-CM | POA: Diagnosis present

## 2023-06-06 DIAGNOSIS — E669 Obesity, unspecified: Secondary | ICD-10-CM | POA: Diagnosis present

## 2023-06-06 DIAGNOSIS — E878 Other disorders of electrolyte and fluid balance, not elsewhere classified: Secondary | ICD-10-CM | POA: Diagnosis present

## 2023-06-06 DIAGNOSIS — I619 Nontraumatic intracerebral hemorrhage, unspecified: Secondary | ICD-10-CM | POA: Insufficient documentation

## 2023-06-06 DIAGNOSIS — F209 Schizophrenia, unspecified: Secondary | ICD-10-CM | POA: Diagnosis present

## 2023-06-06 DIAGNOSIS — J9600 Acute respiratory failure, unspecified whether with hypoxia or hypercapnia: Secondary | ICD-10-CM

## 2023-06-06 DIAGNOSIS — T45515A Adverse effect of anticoagulants, initial encounter: Secondary | ICD-10-CM | POA: Diagnosis present

## 2023-06-06 DIAGNOSIS — F0390 Unspecified dementia without behavioral disturbance: Secondary | ICD-10-CM | POA: Insufficient documentation

## 2023-06-06 DIAGNOSIS — I4892 Unspecified atrial flutter: Secondary | ICD-10-CM | POA: Diagnosis not present

## 2023-06-06 DIAGNOSIS — Z79899 Other long term (current) drug therapy: Secondary | ICD-10-CM

## 2023-06-06 DIAGNOSIS — I615 Nontraumatic intracerebral hemorrhage, intraventricular: Secondary | ICD-10-CM | POA: Diagnosis present

## 2023-06-06 DIAGNOSIS — Z66 Do not resuscitate: Secondary | ICD-10-CM | POA: Diagnosis not present

## 2023-06-06 DIAGNOSIS — E1122 Type 2 diabetes mellitus with diabetic chronic kidney disease: Secondary | ICD-10-CM | POA: Insufficient documentation

## 2023-06-06 DIAGNOSIS — G911 Obstructive hydrocephalus: Secondary | ICD-10-CM | POA: Diagnosis not present

## 2023-06-06 DIAGNOSIS — I161 Hypertensive emergency: Secondary | ICD-10-CM | POA: Diagnosis present

## 2023-06-06 DIAGNOSIS — G935 Compression of brain: Secondary | ICD-10-CM | POA: Diagnosis present

## 2023-06-06 DIAGNOSIS — Z955 Presence of coronary angioplasty implant and graft: Secondary | ICD-10-CM

## 2023-06-06 DIAGNOSIS — G929 Unspecified toxic encephalopathy: Secondary | ICD-10-CM | POA: Diagnosis present

## 2023-06-06 DIAGNOSIS — I612 Nontraumatic intracerebral hemorrhage in hemisphere, unspecified: Secondary | ICD-10-CM

## 2023-06-06 DIAGNOSIS — Z7982 Long term (current) use of aspirin: Secondary | ICD-10-CM

## 2023-06-06 DIAGNOSIS — R29723 NIHSS score 23: Secondary | ICD-10-CM | POA: Diagnosis present

## 2023-06-06 DIAGNOSIS — N401 Enlarged prostate with lower urinary tract symptoms: Secondary | ICD-10-CM | POA: Diagnosis present

## 2023-06-06 DIAGNOSIS — F0150 Vascular dementia without behavioral disturbance: Secondary | ICD-10-CM | POA: Diagnosis present

## 2023-06-06 DIAGNOSIS — I2510 Atherosclerotic heart disease of native coronary artery without angina pectoris: Secondary | ICD-10-CM | POA: Diagnosis present

## 2023-06-06 DIAGNOSIS — Z833 Family history of diabetes mellitus: Secondary | ICD-10-CM

## 2023-06-06 DIAGNOSIS — Z87891 Personal history of nicotine dependence: Secondary | ICD-10-CM

## 2023-06-06 DIAGNOSIS — D6832 Hemorrhagic disorder due to extrinsic circulating anticoagulants: Secondary | ICD-10-CM | POA: Diagnosis present

## 2023-06-06 DIAGNOSIS — R627 Adult failure to thrive: Secondary | ICD-10-CM | POA: Diagnosis present

## 2023-06-06 DIAGNOSIS — K5900 Constipation, unspecified: Secondary | ICD-10-CM | POA: Diagnosis present

## 2023-06-06 DIAGNOSIS — Z7984 Long term (current) use of oral hypoglycemic drugs: Secondary | ICD-10-CM

## 2023-06-06 DIAGNOSIS — Z6829 Body mass index (BMI) 29.0-29.9, adult: Secondary | ICD-10-CM

## 2023-06-06 DIAGNOSIS — R1310 Dysphagia, unspecified: Secondary | ICD-10-CM | POA: Diagnosis present

## 2023-06-06 DIAGNOSIS — M545 Low back pain, unspecified: Secondary | ICD-10-CM | POA: Diagnosis present

## 2023-06-06 LAB — DIFFERENTIAL
Abs Immature Granulocytes: 0.03 10*3/uL (ref 0.00–0.07)
Basophils Absolute: 0 10*3/uL (ref 0.0–0.1)
Basophils Relative: 1 %
Eosinophils Absolute: 0.2 10*3/uL (ref 0.0–0.5)
Eosinophils Relative: 4 %
Immature Granulocytes: 1 %
Lymphocytes Relative: 54 %
Lymphs Abs: 2.3 10*3/uL (ref 0.7–4.0)
Monocytes Absolute: 0.3 10*3/uL (ref 0.1–1.0)
Monocytes Relative: 7 %
Neutro Abs: 1.3 10*3/uL — ABNORMAL LOW (ref 1.7–7.7)
Neutrophils Relative %: 33 %

## 2023-06-06 LAB — PROTIME-INR
INR: 1.1 (ref 0.8–1.2)
Prothrombin Time: 14.5 seconds (ref 11.4–15.2)

## 2023-06-06 LAB — CBC
HCT: 31.7 % — ABNORMAL LOW (ref 39.0–52.0)
Hemoglobin: 10.6 g/dL — ABNORMAL LOW (ref 13.0–17.0)
MCH: 32.6 pg (ref 26.0–34.0)
MCHC: 33.4 g/dL (ref 30.0–36.0)
MCV: 97.5 fL (ref 80.0–100.0)
Platelets: 145 10*3/uL — ABNORMAL LOW (ref 150–400)
RBC: 3.25 MIL/uL — ABNORMAL LOW (ref 4.22–5.81)
RDW: 14.6 % (ref 11.5–15.5)
WBC: 4.1 10*3/uL (ref 4.0–10.5)
nRBC: 0 % (ref 0.0–0.2)

## 2023-06-06 LAB — COMPREHENSIVE METABOLIC PANEL
ALT: 13 U/L (ref 0–44)
AST: 21 U/L (ref 15–41)
Albumin: 3.3 g/dL — ABNORMAL LOW (ref 3.5–5.0)
Alkaline Phosphatase: 51 U/L (ref 38–126)
Anion gap: 4 — ABNORMAL LOW (ref 5–15)
BUN: 21 mg/dL (ref 8–23)
CO2: 25 mmol/L (ref 22–32)
Calcium: 8.4 mg/dL — ABNORMAL LOW (ref 8.9–10.3)
Chloride: 106 mmol/L (ref 98–111)
Creatinine, Ser: 1.1 mg/dL (ref 0.61–1.24)
GFR, Estimated: >60 mL/min (ref >60)
Glucose, Bld: 117 mg/dL — ABNORMAL HIGH (ref 70–99)
Potassium: 4.6 mmol/L (ref 3.5–5.1)
Sodium: 135 mmol/L (ref 135–145)
Total Bilirubin: 0.4 mg/dL (ref 0.3–1.2)
Total Protein: 6.5 g/dL (ref 6.5–8.1)

## 2023-06-06 LAB — POCT I-STAT 7, (LYTES, BLD GAS, ICA,H+H)
Acid-Base Excess: 1 mmol/L (ref 0.0–2.0)
Bicarbonate: 26.7 mmol/L (ref 20.0–28.0)
Calcium, Ion: 1.21 mmol/L (ref 1.15–1.40)
HCT: 35 % — ABNORMAL LOW (ref 39.0–52.0)
Hemoglobin: 11.9 g/dL — ABNORMAL LOW (ref 13.0–17.0)
O2 Saturation: 56 %
Patient temperature: 36
Potassium: 4.7 mmol/L (ref 3.5–5.1)
Sodium: 139 mmol/L (ref 135–145)
TCO2: 28 mmol/L (ref 22–32)
pCO2 arterial: 45.6 mmHg (ref 32–48)
pH, Arterial: 7.371 (ref 7.35–7.45)
pO2, Arterial: 29 mmHg — CL (ref 83–108)

## 2023-06-06 LAB — APTT: aPTT: 34 seconds (ref 24–36)

## 2023-06-06 LAB — CBG MONITORING, ED: Glucose-Capillary: 114 mg/dL — ABNORMAL HIGH (ref 70–99)

## 2023-06-06 LAB — ETHANOL: Alcohol, Ethyl (B): <10 mg/dL (ref <10)

## 2023-06-06 LAB — SODIUM: Sodium: 140 mmol/L (ref 135–145)

## 2023-06-06 LAB — MRSA NEXT GEN BY PCR, NASAL: MRSA by PCR Next Gen: NOT DETECTED

## 2023-06-06 MED ORDER — ETOMIDATE 2 MG/ML IV SOLN: 30.0000 mg | Freq: Once | INTRAVENOUS | Status: AC

## 2023-06-06 MED ORDER — CHLORHEXIDINE GLUCONATE CLOTH 2 % EX PADS
6.0000 | MEDICATED_PAD | Freq: Every day | CUTANEOUS | Status: DC
  Administered 2023-06-07: 6 via TOPICAL

## 2023-06-06 MED ORDER — PROPOFOL 1000 MG/100ML IV EMUL
INTRAVENOUS | Status: AC
  Administered 2023-06-06: 10 ug/kg/min via INTRAVENOUS
  Filled 2023-06-06: qty 100

## 2023-06-06 MED ORDER — ROCURONIUM BROMIDE 10 MG/ML (PF) SYRINGE: 100.0000 mg | PREFILLED_SYRINGE | Freq: Once | INTRAVENOUS | Status: AC

## 2023-06-06 MED ORDER — ETOMIDATE 2 MG/ML IV SOLN
INTRAVENOUS | Status: AC
  Administered 2023-06-06: 30 mg via INTRAVENOUS
  Filled 2023-06-06: qty 20

## 2023-06-06 MED ORDER — CLEVIDIPINE BUTYRATE 0.5 MG/ML IV EMUL
0.0000 mg/h | INTRAVENOUS | Status: DC
  Administered 2023-06-06 (×2): 2 mg/h via INTRAVENOUS
  Filled 2023-06-06 (×2): qty 50

## 2023-06-06 MED ORDER — STROKE: EARLY STAGES OF RECOVERY BOOK: Freq: Once | Status: AC

## 2023-06-06 MED ORDER — DOCUSATE SODIUM 50 MG/5ML PO LIQD
100.0000 mg | Freq: Two times a day (BID) | ORAL | Status: DC
  Administered 2023-06-07: 100 mg
  Filled 2023-06-06 (×2): qty 10

## 2023-06-06 MED ORDER — LORAZEPAM 2 MG/ML IJ SOLN: 2.0000 mg | INTRAMUSCULAR | Status: DC | PRN

## 2023-06-06 MED ORDER — AMIODARONE HCL 200 MG PO TABS
200.0000 mg | ORAL_TABLET | Freq: Every day | ORAL | Status: DC
  Administered 2023-06-07: 200 mg
  Filled 2023-06-06 (×2): qty 1

## 2023-06-06 MED ORDER — EMPTY CONTAINERS FLEXIBLE MISC
900.0000 mg | Freq: Once | Status: AC
  Administered 2023-06-06: 900 mg via INTRAVENOUS
  Filled 2023-06-06 (×2): qty 90

## 2023-06-06 MED ORDER — ACETAMINOPHEN 160 MG/5ML PO SOLN: 650.0000 mg | ORAL | Status: DC | PRN

## 2023-06-06 MED ORDER — CLEVIDIPINE BUTYRATE 0.5 MG/ML IV EMUL
0.0000 mg/h | INTRAVENOUS | Status: DC
  Administered 2023-06-07: 2 mg/h via INTRAVENOUS
  Filled 2023-06-06: qty 50

## 2023-06-06 MED ORDER — ACETAMINOPHEN 650 MG RE SUPP: 650.0000 mg | RECTAL | Status: DC | PRN

## 2023-06-06 MED ORDER — SODIUM CHLORIDE 3 % IV SOLN
INTRAVENOUS | Status: DC
  Filled 2023-06-06 (×4): qty 500

## 2023-06-06 MED ORDER — SODIUM CHLORIDE 0.9% FLUSH: 3.0000 mL | Freq: Once | INTRAVENOUS | Status: DC

## 2023-06-06 MED ORDER — PROPOFOL 1000 MG/100ML IV EMUL
0.0000 ug/kg/min | INTRAVENOUS | Status: DC
  Administered 2023-06-07 (×2): 30 ug/kg/min via INTRAVENOUS
  Filled 2023-06-06 (×2): qty 100

## 2023-06-06 MED ORDER — ROCURONIUM BROMIDE 10 MG/ML (PF) SYRINGE
PREFILLED_SYRINGE | INTRAVENOUS | Status: AC
  Administered 2023-06-06: 100 mg via INTRAVENOUS
  Filled 2023-06-06: qty 10

## 2023-06-06 MED ORDER — MIDODRINE HCL 5 MG PO TABS: 10.0000 mg | ORAL_TABLET | Freq: Once | ORAL | Status: DC

## 2023-06-06 MED ORDER — FENTANYL CITRATE PF 50 MCG/ML IJ SOSY: 25.0000 ug | PREFILLED_SYRINGE | INTRAMUSCULAR | Status: DC | PRN

## 2023-06-06 MED ORDER — ALBUTEROL SULFATE (2.5 MG/3ML) 0.083% IN NEBU: 2.5000 mg | INHALATION_SOLUTION | RESPIRATORY_TRACT | Status: DC | PRN

## 2023-06-06 MED ORDER — IPRATROPIUM-ALBUTEROL 0.5-2.5 (3) MG/3ML IN SOLN
3.0000 mL | Freq: Four times a day (QID) | RESPIRATORY_TRACT | Status: DC
  Administered 2023-06-07: 3 mL via RESPIRATORY_TRACT
  Filled 2023-06-06: qty 3

## 2023-06-06 MED ORDER — SODIUM CHLORIDE 3% (HYPERTONIC SALINE) BOLUS VIA INFUSION
100.0000 mL | Freq: Once | INTRAVENOUS | Status: DC
  Filled 2023-06-06 (×3): qty 100

## 2023-06-06 MED ORDER — SODIUM CHLORIDE 3 % IV BOLUS
250.0000 mL | Freq: Once | INTRAVENOUS | Status: AC
  Administered 2023-06-06: 250 mL via INTRAVENOUS
  Filled 2023-06-06: qty 500

## 2023-06-06 MED ORDER — ACETAMINOPHEN 325 MG PO TABS: 650.0000 mg | ORAL_TABLET | ORAL | Status: DC | PRN

## 2023-06-06 MED ORDER — SODIUM CHLORIDE 3 % IV SOLN
INTRAVENOUS | Status: DC
  Filled 2023-06-06 (×2): qty 500

## 2023-06-06 MED ORDER — PROPOFOL 1000 MG/100ML IV EMUL: 5.0000 ug/kg/min | INTRAVENOUS | Status: DC

## 2023-06-06 MED ORDER — SENNOSIDES-DOCUSATE SODIUM 8.6-50 MG PO TABS
1.0000 | ORAL_TABLET | Freq: Two times a day (BID) | ORAL | Status: DC
  Filled 2023-06-06 (×2): qty 1

## 2023-06-06 MED ORDER — FAMOTIDINE 20 MG PO TABS
20.0000 mg | ORAL_TABLET | Freq: Two times a day (BID) | ORAL | Status: DC
  Administered 2023-06-07 (×2): 20 mg
  Filled 2023-06-06 (×3): qty 1

## 2023-06-06 MED ORDER — IOHEXOL 350 MG/ML SOLN
75.0000 mL | Freq: Once | INTRAVENOUS | Status: AC | PRN
  Administered 2023-06-06: 75 mL via INTRAVENOUS

## 2023-06-06 MED ORDER — PANTOPRAZOLE SODIUM 40 MG IV SOLR
40.0000 mg | Freq: Every day | INTRAVENOUS | Status: DC
  Administered 2023-06-07 – 2023-06-08 (×3): 40 mg via INTRAVENOUS
  Filled 2023-06-06 (×3): qty 10

## 2023-06-06 MED ORDER — ATORVASTATIN CALCIUM 10 MG PO TABS
10.0000 mg | ORAL_TABLET | Freq: Every day | ORAL | Status: DC
  Administered 2023-06-07: 10 mg
  Filled 2023-06-06 (×2): qty 1

## 2023-06-06 MED ORDER — EMPTY CONTAINERS FLEXIBLE MISC: 1800.0000 mg | Freq: Once | Status: DC

## 2023-06-06 MED ORDER — INSULIN ASPART 100 UNIT/ML IJ SOLN
0.0000 [IU] | INTRAMUSCULAR | Status: DC
  Administered 2023-06-07 (×3): 2 [IU] via SUBCUTANEOUS

## 2023-06-06 MED ORDER — POLYETHYLENE GLYCOL 3350 17 G PO PACK
17.0000 g | PACK | Freq: Every day | ORAL | Status: DC
  Filled 2023-06-06: qty 1

## 2023-06-06 NOTE — Consult Note (Signed)
NAME:  Brian Dodson, MRN:  [id number], DOB:  Apr 10, 1956, LOS: 0 ADMISSION DATE:  06-14-23, CONSULTATION DATE: 2023-06-14 REFERRING MD:  Dr Iver Nestle, CHIEF COMPLAINT: ICH  History of Present Illness:  67 year old male with past medical history as below, which is significant for hypertension, hyperlipidemia, diabetes mellitus, vascular dementia, schizophrenia, coronary artery disease, and atrial fibrillation on Eliquis.  He was recently admitted June 25 for acute encephalopathy which was ultimately determined to be secondary to urinary tract infection and improved with antibiotic administration.  He resides in a SNF where he was discharged to on 6/26.  He then presented to [facility] ED via EMS on 2023-06-14 after being found down at his SNF.  Last known well at around 3 PM and arrived to the ED around 5 PM.  Upon arrival to the ED he would open eyes to voice, but not follow commands.  Moving right side not purposefully.  CT scan of the head described right-sided intercranial hemorrhage with intraventricular extension and evidence of uncal herniation.  Eliquis was reversed with Andexxa.  There was also evidence of seizure activity while in the emergency department.  This was treated effectively with benzodiazepine.  He was ultimately intubated for airway protection and transported to [facility] for ICU admission and EEG monitoring.  Pertinent  Medical History   has a past medical history of Anemia, Asthma, Atrial fibrillation (HCC), Benign prostatic hypertrophy with lower urinary tract symptoms (LUTS), Bulbous urethral stricture, CAD (coronary artery disease), CKD (chronic kidney disease), Constipation, COPD (chronic obstructive pulmonary disease) (HCC), Diabetes mellitus without complication (HCC), Dizziness, Dyspnea, GERD (gastroesophageal reflux disease), Gross hematuria, Hyperlipemia, Hypertension, Lumbago, Palpitation, Schizophrenia (HCC), and Vascular dementia (HCC).   Significant Hospital  Events: Including procedures, antibiotic start and stop dates in addition to other pertinent events     Interim History / Subjective:    Objective   Blood pressure (!) 143/122, pulse 71, temperature (!) 96.8 F (36 C), resp. rate 20, SpO2 100%.    Vent Mode: PRVC FiO2 (%):  [40 %] 40 % Set Rate:  [20 bmp] 20 bmp Vt Set:  [520 mL] 520 mL PEEP:  [5 cmH20] 5 cmH20 Plateau Pressure:  [10 cmH20] 10 cmH20  No intake or output data in the 24 hours ending 06/14/2023 2223 There were no vitals filed for this visit.  Examination: General: Adult male in no acute distress on ventilator HENT: Normocephalic, atraumatic, right pupil 4 mm and reactive, left pupil 2 mm and more sluggish. Lungs: Expiratory wheeze Cardiovascular: Regular rate and rhythm Abdomen: Soft, nontender, nondistended Extremities: No acute deformity, range of motion limitation, or edema Neuro: Sedated.  No response to voice or touch.  Flexion of bilateral lower extremities to pain stronger on the right.  Cough and gag intact.  Corneal reflex intact.  Resolved Hospital Problem list     Assessment & Plan:   Intracranial hemorrhage: 65 cc right parietotemporal bleed. Intraventricular extension. GCS 8 upon arrival to ED giving him an ICH score of 3. Evidence of uncal herniation as well brain compression - Support in ICU - Neurology following - Hypertonic saline, trend serum sodium - Repeat CT at midnight - clevidipine infusion for SBP goal 130-150 mmhg  Respiratory insufficiency secondary to acute encephalopathy - Full vent support - ABG reviewed and settings adjusted - Daily SBT  - VAP bundle - Propofol and PRN fentanyl for RASS goal -1 to -2.   Seizure: - AEDs per neurology - He is on Depakote at home but I  suspect this is for behavioral diagnoses - EEG LTM - Seizure precautions  Atrial fibrillation  CAD - Hold Eliquis and ASA - continue amiodarone, statin  DM - CBG monitoring and SSI  Asthma: listed in  history. No home medications. Wheezing on exam here.  - Scheduled nebs  Vascular Dementia Schizophrenia - holding home medications  CKD  - creatinine is at baseline - Trend chemistry  GOC - Patient has HCPOA appointed by the county. Family is here and they feel like he wouldn't want to be kept alive on life support if chances of a meaningful recovery were slim, but they defer to the [facility]. Niece Maryan Char is who I spoke to.   Best Practice (right click and "Reselect all SmartList Selections" daily)   Diet/type: NPO DVT prophylaxis: not indicated GI prophylaxis: H2B Lines: N/A Foley:  Yes, and it is still needed Code Status:  full code Last date of multidisciplinary goals of care discussion [ 7/18: ]  Labs   CBC: Recent Labs  Lab 06/09/2023 1718 06/02/2023 2201  WBC 4.1  --   NEUTROABS 1.3*  --   HGB 10.6* 11.9*  HCT 31.7* 35.0*  MCV 97.5  --   PLT 145*  --     Basic Metabolic Panel: Recent Labs  Lab 05/26/2023 1718 06/09/2023 2201  NA 135 139  K 4.6 4.7  CL 106  --   CO2 25  --   GLUCOSE 117*  --   BUN 21  --   CREATININE 1.10  --   CALCIUM 8.4*  --    GFR: Estimated Creatinine Clearance: 88.1 mL/min (by C-G formula based on SCr of 1.1 mg/dL). Recent Labs  Lab 06/14/2023 1718  WBC 4.1    Liver Function Tests: Recent Labs  Lab 06/17/2023 1718  AST 21  ALT 13  ALKPHOS 51  BILITOT 0.4  PROT 6.5  ALBUMIN 3.3*   No results for input(s): "LIPASE", "AMYLASE" in the last 168 hours. No results for input(s): "AMMONIA" in the last 168 hours.  ABG    Component Value Date/Time   PHART 7.371 05/31/2023 2201   PCO2ART 45.6 06/11/2023 2201   PO2ART 29 (LL) 05/25/2023 2201   HCO3 26.7 06/05/2023 2201   TCO2 28 05/29/2023 2201   O2SAT 56 06/03/2023 2201     Coagulation Profile: Recent Labs  Lab 06/05/2023 1718  INR 1.1    Cardiac Enzymes: No results for input(s): "CKTOTAL", "CKMB", "CKMBINDEX", "TROPONINI" in the last 168 hours.  HbA1C: Hgb A1c MFr Bld   Date/Time Value Ref Range Status  05/14/2023 02:02 PM 5.9 (H) 4.8 - 5.6 % Final    Comment:    (NOTE)         Prediabetes: 5.7 - 6.4         Diabetes: >6.4         Glycemic control for adults with diabetes: <7.0   06/14/2022 12:55 PM 6.1 (H) 4.8 - 5.6 % Final    Comment:    (NOTE) Pre diabetes:          5.7%-6.4%  Diabetes:              >6.4%  Glycemic control for   <7.0% adults with diabetes     CBG: Recent Labs  Lab 06/02/2023 1716  GLUCAP 114*    Review of Systems:   Patient is encephalopathic and/or intubated; therefore, history has been obtained from chart review.    Past Medical History:  He,  has a past  medical history of Anemia, Asthma, Atrial fibrillation (HCC), Benign prostatic hypertrophy with lower urinary tract symptoms (LUTS), Bulbous urethral stricture, CAD (coronary artery disease), CKD (chronic kidney disease), Constipation, COPD (chronic obstructive pulmonary disease) (HCC), Diabetes mellitus without complication (HCC), Dizziness, Dyspnea, GERD (gastroesophageal reflux disease), Gross hematuria, Hyperlipemia, Hypertension, Lumbago, Palpitation, Schizophrenia (HCC), and Vascular dementia (HCC).   Surgical History:   Past Surgical History:  Procedure Laterality Date   CATARACT EXTRACTION W/PHACO Right 01/16/2022   Procedure: CATARACT EXTRACTION PHACO AND INTRAOCULAR LENS PLACEMENT (IOC) RIGHT DIABETIC 4.48 00:34.3;  Surgeon: Galen Manila, MD;  Location: [facility];  Service: Ophthalmology;  Laterality: Right;  Diabetic   CATARACT EXTRACTION W/PHACO Left 03/13/2022   Procedure: CATARACT EXTRACTION PHACO AND INTRAOCULAR LENS PLACEMENT (IOC) LEFT DIABETIC malyugin 8.39 01:02.9 ;  Surgeon: Galen Manila, MD;  Location: [facility];  Service: Ophthalmology;  Laterality: Left;  Diabetic   COLONOSCOPY     COLONOSCOPY WITH PROPOFOL N/A 09/22/2021   Procedure: COLONOSCOPY WITH PROPOFOL;  Surgeon: Regis Bill, MD;  Location: [facility]  ENDOSCOPY;  Service: Endoscopy;  Laterality: N/A;   COLONOSCOPY WITH PROPOFOL N/A 01/02/2022   Procedure: COLONOSCOPY WITH PROPOFOL;  Surgeon: Regis Bill, MD;  Location: [facility] ENDOSCOPY;  Service: Endoscopy;  Laterality: N/A;   CORONARY ANGIOPLASTY WITH STENT PLACEMENT  11/2003   kidney stent     TEE WITHOUT CARDIOVERSION N/A 09/20/2021   Procedure: TRANSESOPHAGEAL ECHOCARDIOGRAM (TEE);  Surgeon: Dalia Heading, MD;  Location: [facility] ORS;  Service: Cardiovascular;  Laterality: N/A;     Social History:   reports that he has quit smoking. He has never used smokeless tobacco. He reports that he does not drink alcohol and does not use drugs.   Family History:  His family history includes Diabetes in an other family member. There is no history of Bladder Cancer, Kidney cancer, or Prostate cancer.   Allergies Allergies  Allergen Reactions   Sulfa Antibiotics Other (See Comments)    Unknown reaction   Xarelto [Rivaroxaban]      Home Medications  Prior to Admission medications   Medication Sig Start Date End Date Taking? Authorizing Provider  acetaminophen (TYLENOL) 500 MG tablet Take 1,000 mg by mouth in the morning, at noon, and at bedtime.    [provider]  amiodarone (PACERONE) 200 MG tablet Take 1 tablet (200 mg total) by mouth daily. 07/26/21   Charise Killian, MD  aspirin 81 MG tablet Take 81 mg by mouth daily.     [provider]  atorvastatin (LIPITOR) 10 MG tablet Take 10 mg by mouth daily. 05/18/15   [provider]  benztropine (COGENTIN) 1 MG tablet Take 1 mg by mouth at bedtime.     [provider]  brimonidine-timolol (COMBIGAN) 0.2-0.5 % ophthalmic solution Place 1 drop into both eyes 2 (two) times daily. Wait 3 to 5 minutes between drops    [provider]  citalopram (CELEXA) 20 MG tablet Take 20 mg by mouth daily.    [provider]  divalproex (DEPAKOTE ER) 500 MG 24 hr tablet Take 2 tablets (1,000 mg total) by  mouth 2 (two) times daily. 06/18/22   Esaw Grandchild A, DO  donepezil (ARICEPT) 10 MG tablet Take 10 mg by mouth at bedtime.     [provider]  ELIQUIS 5 MG TABS tablet Take 5 mg by mouth 2 (two) times daily. 05/18/15   [provider]  ferrous sulfate 325 (65 FE) MG tablet Take 1 tablet (325  mg total) by mouth daily with breakfast. (Hold for 1 week, resume on 06/25/22) 06/18/22   Pennie Banter, DO  lanolin/mineral oil (KERI/THERA-DERM) LOTN Apply 1 application topically as needed for dry skin. Apply to face after washing face daily Patient not taking: Reported on 05/27/2023    [provider]  loratadine (CLARITIN) 10 MG tablet Take 10 mg by mouth daily. 05/01/22   [provider]  LUMIGAN 0.01 % SOLN Place 1 drop into both eyes at bedtime. 07/06/21   [provider]  Magnesium Oxide 400 MG CAPS Take 1 capsule (400 mg total) by mouth daily. (Hold for 1 week, resume on 06/25/22) 06/18/22   Esaw Grandchild A, DO  melatonin 5 MG TABS Take 10 mg by mouth at bedtime.    [provider]  Menthol, Topical Analgesic, (BIOFREEZE) 4 % GEL Apply 1 application topically 3 (three) times daily. To lower back area as needed for pain    [provider]  metFORMIN (GLUCOPHAGE) 500 MG tablet Take 500 mg by mouth 2 (two) times daily with a meal.    [provider]  omeprazole (PRILOSEC) 20 MG capsule Take 20 mg by mouth 2 (two) times daily before a meal. May open and sprinkle in applesauce.    [provider]  [name] (OYSTER CALCIUM) 500 MG TABS tablet Take 500 mg of elemental calcium by mouth 2 (two) times daily.    [provider]  risperidone (RISPERDAL) 4 MG tablet Take 4 mg by mouth at bedtime. 05/18/15   [provider]  Skin Protectants, Misc. (MINERIN CREME) CREA Apply 1 Application topically in the morning. Apply to arms and legs    [provider]  sodium chloride (OCEAN) 0.65 % SOLN nasal spray Place  1 spray into both nostrils daily.    [provider]  zinc oxide 20 % ointment Apply 1 Application topically 2 (two) times daily as needed for irritation (to left buttock).    [provider]     Critical care time: 48 minutes     Joneen Roach, AGACNP-BC  Pulmonary & Critical Care  See Amion for personal pager PCCM on call pager [phone] until 7pm. Please call Elink 7p-7a. [phone]  05/22/2023 10:51 PM

## 2023-06-06 NOTE — ED Notes (Signed)
MD notified of patients decrease in HR.

## 2023-06-06 NOTE — ED Notes (Signed)
NIH completed to best ability. Pt unable to follow commands. Would only open eyes upon request. Pt was lifting right arm and extending fully and squeezing right hand randomly.

## 2023-06-06 NOTE — Consult Note (Addendum)
PHARMACY CONSULT NOTE - Hypertonic Saline  Pharmacy Consult for Hypertonic Saline Monitoring and Management  Recent Labs: Potassium (mmol/L)  Date Value  2023-06-24 4.6  02/14/2015 4.3   Magnesium (mg/dL)  Date Value  64/40/3474 1.9   Calcium (mg/dL)  Date Value  25/95/6387 8.4 (L)   Calcium, Total (mg/dL)  Date Value  56/43/3295 9.0   Albumin (g/dL)  Date Value  18/84/1660 3.3 (L)  02/01/2014 2.8 (L)   Phosphorus (mg/dL)  Date Value  63/11/6008 3.2   Sodium (mmol/L)  Date Value  Jun 24, 2023 135  02/14/2015 138    Assessment  Brian Dodson is a 67 y.o. male presenting with ICH. PMH significant for AF (on apixaban), T2DM, HTN, GERD, dementia, HLD, CKD. Pharmacy has been consulted to monitor hypertonic saline (3%) infusion.  Baseline Labs: Serum Na 135  Goal of Therapy:  Na between 150-155  Monitoring:  Date Time Na Rate/Comment    Plan:  Give 250 mL bolus x1 Initiate 3% saline at 75 mL/hr Check Na Q6H  Check Na Q6H x2 after discontinuation of hypertonic saline infusion Continue to monitor for signs of clinical improvement and recommendations per neurology  Celene Squibb, PharmD Clinical Pharmacist 24-Jun-2023 6:01 PM

## 2023-06-06 NOTE — Code Documentation (Addendum)
ET tube 23 at the lips

## 2023-06-06 NOTE — ED Notes (Signed)
Called pharmacy to tube a bag of 3% saline to ED for carelink. MD approval. [facility] does not have 3% saline stocked on the truck. Instructions from ED provider to carelink to give a bolus in route if he brady's further. This RN is not to give ordered bolus at this time.

## 2023-06-06 NOTE — Plan of Care (Signed)
Neurology plan of care  I was contacted by EDP regarding this 67 yo man with pmhx a fib on eliquis, CAD, CKD, COPD, DM, HL, HTN, schizophrenia, and vascular dementia who was BIB EMS after being found pooly responsive on the floor at his ALF. Per facility pt very high-functioning, was doing jumping jacks this AM. LKW approx 1500. He was later found poorly responsive lying on the floor of another patient's room (unusual him to visit others). He did not have any obvious bleeding or injury from the fall. He was brought to ED by EMS. CT head showed 65cc R parieto-temporal acute hematoma with I'VE and likely uncal herniation (personal review and discussion with radiologist).  His eliquis was promptly reversed with andexxa. SBP 140s-170s therefore clevidipine was ordered for goal SBP <150. CTA head is pending. He was started on hypertonic saline (3% w/ 250cc bolus f/b continuous infusion via PIV @ 75 cc/hr) and is currently being intubated for airway protection.  Family not available/involved. Pt has a county-appointed guardian who is also his HCPOA. Dr. Derrill Kay was able to reach them and they stated that he would want everything done if there is a chance he would be able to recover.   Recommendations: - STAT transfer to [facility] for admission to 4N neuro ICU. Accepting MD is Dr. Brooke Dare who has accepted the patient. If there are no available 4N beds we agreed that Dr. Derrill Kay would request ED to ED transfer to get her there as soon as possible. - Eliquis was reversed with andexxa - Clevidipine for goal SBP <150 - SCDs for DVT prophylaxis - Patient will be intubated prior to transfer to [facility] elevated 30 degrees - Hypertonic saline ordered, 3% 250cc bolus then 3% continuous PIV per protocol @ 75cc/hr - F/u CTA head - Repeat head CT in 6 hrs assess ICH stability - Consult NSU if indicated after transfer - Further mgmt after transfer to [facility] per admitting MD  Please page me with any questions  before 8pm, page Dr. Iver Nestle for any questions after 8pm.  D/w Dr. Iver Nestle and Dr. Domenica Reamer, MD [facility] [phone]  If 7pm- 7am, please page neurology on call as listed in AMION.

## 2023-06-06 NOTE — ED Notes (Addendum)
OG tube not fully visualized on chest xray. Pt already loaded on stretcher with carelink at this time. Alver Sorrow, RN at [facility] cone to inform her that a KUB will need to be ordered upon arrival for placement verification. Placement was verified with auscultation, however per protocol placement still needs to be verified with xray. OG tube is clamped and was NOT used at this facility and remained clamped after placement. Zac, with carelink aware of need for xray verification as well and agreeable with plan.

## 2023-06-06 NOTE — ED Notes (Signed)
Spoke with Kylie with [facility].

## 2023-06-06 NOTE — ED Notes (Signed)
Unable to complete NIH due to patients deteriorating condition and inability to perform tasks.

## 2023-06-06 NOTE — ED Notes (Signed)
Niece at bedside. Niece requesting for no interventions, but is not POA. MD attempting to get in contact with DSS on call as patients legal guardian.

## 2023-06-06 NOTE — ED Notes (Signed)
Awaiting coag fact from pharmacy at this time

## 2023-06-06 NOTE — ED Notes (Signed)
RN remains at bedside. Pt still maintaining airway and VSS at this time.

## 2023-06-06 NOTE — Progress Notes (Signed)
eLink Physician-Brief Progress Note Patient Name: Brian Dodson DOB: [birth year] MRN: [id number]   Date of Service  06/22/2023  HPI/Events of Note  Patient admitted with intracranial hemorrhage, coma and acute respiratory failure requiring intubation and mechanical ventilation.  eICU Interventions  New Patient Evaluation.        [name] 22-Jun-2023, 10:06 PM

## 2023-06-06 NOTE — ED Notes (Signed)
SBP 129. Decreased Cleviprex dose

## 2023-06-06 NOTE — *Deleted (Incomplete)
ALF High functioning

## 2023-06-06 NOTE — Consult Note (Addendum)
PHARMACY CONSULT NOTE - Anticoagulation Reversal  Assessment Brian Dodson is a 67 y.o. male presenting with AMS. Patient was on Eliquis 5 mg BID per nursing home MAG. Pharmacy has been consulted to monitor post reversal.  Anticoagulant: Eliquis 5 mg BID Last dose: 06/01/2023 at 0800  Recent Labs    05/23/2023 1718  HGB 10.6*  HCT 31.7*  PLT 145*  APTT 34  LABPROT 14.5  INR 1.1  CREATININE 1.10    Plan:  Give low dose Andexxa Pharmacy will sign off and continue to monitor peripherally  Thank you for allowing pharmacy to be a part of this patient's care.  Celene Squibb, PharmD PGY1 Pharmacy Resident 06/05/2023 5:50 PM

## 2023-06-06 NOTE — ED Notes (Signed)
Report given to Magda Paganini, Charity fundraiser at [facility].

## 2023-06-06 NOTE — ED Notes (Signed)
X-ray at bedside

## 2023-06-06 NOTE — ED Provider Notes (Signed)
[facility] Provider Note    Event Date/Time   First MD Initiated Contact with Patient 06/12/2023 1716     (approximate)   History   Code Stroke   HPI  Brian Dodson is a 67 y.o. male  who presents to the emergency department today after being found on the ground in another persons room. Apparently the patient was last seen normal roughly 2 hours previously. Patient is unable to give any history. Per chart review patient is on blood thinner.    Physical Exam   Triage Vital Signs: ED Triage Vitals  Encounter Vitals Group     BP 06/16/2023 1721 (!) 156/105     Systolic BP Percentile --      Diastolic BP Percentile --      Pulse Rate 05/26/2023 1721 87     Resp 06/13/2023 1721 18     Temp 05/28/2023 1721 97.8 F (36.6 C)     Temp Source 06/04/2023 1721 Axillary     SpO2 06/12/2023 1721 98 %     Weight 06/16/2023 1751 240 lb (108.9 kg)     Height 06/11/2023 1757 6\' 4"  (1.93 m)     Head Circumference --      Peak Flow --      Pain Score --      Pain Loc --      Pain Education --      Exclude from Growth Chart --     Most recent vital signs: Vitals:   06/05/2023 1750 05/30/2023 1800  BP: (!) 177/115 (!) 143/102  Pulse: 65 73  Resp: 15 (!) 21  Temp:    SpO2: 96% 96%   General: Minimally responsive. Occasional voluntary movement. CV:  Good peripheral perfusion.  Resp:  Normal effort.  Abd:  No distention.  Neuro:  GCS of 7, E2M4V1   ED Results / Procedures / Treatments   Labs (all labs ordered are listed, but only abnormal results are displayed) Labs Reviewed  CBC - Abnormal; Notable for the following components:      Result Value   RBC 3.25 (*)    Hemoglobin 10.6 (*)    HCT 31.7 (*)    Platelets 145 (*)    All other components within normal limits  DIFFERENTIAL - Abnormal; Notable for the following components:   Neutro Abs 1.3 (*)    All other components within normal limits  COMPREHENSIVE METABOLIC PANEL - Abnormal; Notable for the following  components:   Glucose, Bld 117 (*)    Calcium 8.4 (*)    Albumin 3.3 (*)    Anion gap 4 (*)    All other components within normal limits  CBG MONITORING, ED - Abnormal; Notable for the following components:   Glucose-Capillary 114 (*)    All other components within normal limits  PROTIME-INR  APTT  ETHANOL  SODIUM  CBG MONITORING, ED     EKG  I, Phineas Semen, attending physician, personally viewed and interpreted this EKG  EKG Time: 1748 Rate: 70 Rhythm: atrial flutter Axis: normal Intervals: qtc 406 QRS: narrow, q waves v1, v2 ST changes: no st elevation Impression: abnormal ekg  RADIOLOGY I independently interpreted and visualized the CT head. My interpretation: large right sided bleed Radiology interpretation:  IMPRESSION:  1. Approximate 65 cc parenchymal hematoma centered in the right  parietotemporal region with evidence of intraventricular extension  and likely early uncal herniation on the right.  2. Near-complete effacement of the right lateral ventricular  system.  Size and shape of the ventricular system is otherwise unchanged from  prior exam.      PROCEDURES:  Critical Care performed: Yes  CRITICAL CARE Performed by: Phineas Semen   Total critical care time: 40 minutes  Critical care time was exclusive of separately billable procedures and treating other patients.  Critical care was necessary to treat or prevent imminent or life-threatening deterioration.  Critical care was time spent personally by me on the following activities: development of treatment plan with patient and/or surrogate as well as nursing, discussions with consultants, evaluation of patient's response to treatment, examination of patient, obtaining history from patient or surrogate, ordering and performing treatments and interventions, ordering and review of laboratory studies, ordering and review of radiographic studies, pulse oximetry and re-evaluation of patient's  condition.   Procedures  INTUBATION Performed by: Phineas Semen  Required items: required blood products, implants, devices, and special equipment available Patient identity confirmed: provided demographic data and hospital-assigned identification number Time out: Immediately prior to procedure a "time out" was called to verify the correct patient, procedure, equipment, support staff and site/side marked as required.  Indications: airway protection  Intubation method: Glidescope Laryngoscopy   Preoxygenation: BVM  Sedatives: Etomidate Paralytic: Rocuronium  Post-procedure assessment: chest rise and ETCO2 monitor Breath sounds: equal and absent over the epigastrium Tube secured with: ETT holder Chest x-ray interpreted by radiologist and me.  Chest x-ray findings: endotracheal tube in appropriate position  Patient tolerated the procedure well with no immediate complications.     MEDICATIONS ORDERED IN ED: Medications  sodium chloride flush (NS) 0.9 % injection 3 mL (3 mLs Intravenous Not Given 06/04/2023 1759)  coag fact Xa recombinant (ANDEXXA) low dose infusion 900 mg (has no administration in time range)  sodium chloride 3% (hypertonic) IV bolus 250 mL (has no administration in time range)  sodium chloride (hypertonic) 3 % solution (has no administration in time range)  rocuronium bromide 10 mg/mL (PF) syringe (has no administration in time range)  etomidate (AMIDATE) injection 30 mg (has no administration in time range)  iohexol (OMNIPAQUE) 350 MG/ML injection 75 mL (75 mLs Intravenous Contrast Given 05/25/2023 1731)     IMPRESSION / MDM / ASSESSMENT AND PLAN / ED COURSE  I reviewed the triage vital signs and the nursing notes.                              Differential diagnosis includes, but is not limited to, CVA, ICH, infection, dehydration.  Patient's presentation is most consistent with acute presentation with potential threat to life or bodily function.   The  patient is on the cardiac monitor to evaluate for evidence of arrhythmia and/or significant heart rate changes.  Patient presented to the emergency department today because of concern for altered mental status and possible stroke.  On initial exam patient was significantly altered.  Stat head CT was performed which showed a large intraparenchymal bleed.  In discussion with patient's guardians through DSS they did request that patient remain full code at this time.  Thus patient was intubated to protect his airway.  Dr. Selina Cooley with neurology did evaluate the patient upon arrival and helped facilitate transfer to [facility].     FINAL CLINICAL IMPRESSION(S) / ED DIAGNOSES   Final diagnoses:  Nontraumatic intracerebral hemorrhage, unspecified cerebral location, unspecified laterality [facility])     Note:  This document was prepared using Dragon voice recognition software and may include  unintentional dictation errors.    Phineas Semen, MD 05/26/2023 2325

## 2023-06-06 NOTE — ED Notes (Signed)
MD notified of continued drop of HR into the 30's.

## 2023-06-06 NOTE — ED Notes (Signed)
Nieces and nephew informed of pt's room information

## 2023-06-06 NOTE — ED Notes (Signed)
EMTALA reviewed by charge RN at this time.  All documentation complete and pt is ready to transfer via [facility].

## 2023-06-06 NOTE — ED Notes (Signed)
1713 Activation. 1714 Pt to CT and neuro paged 1719 Dr Selina Cooley joins cart [phone] Radiology reports pt has large bleed.  Dr Selina Cooley contacting pharmacy to reverse Eliquis 1731 Off cart

## 2023-06-06 NOTE — ED Notes (Signed)
[facility] arrived to transport patient.  

## 2023-06-06 NOTE — ED Notes (Signed)
OG tube in place.

## 2023-06-06 NOTE — ED Triage Notes (Addendum)
Pt arrives to the ED via [facility] from [facility] in [place]. Pt was LKW was around 3pm. This morning patient was doing jumping jacks and performing ADL's prior to this. Staff found him on the floor. Pt presents with left sided deficits. Pt has a hx of afib and is on eliquis. ED provider at bedside at time of patients arrival. Telestroke MD paged and answered during triage. Pt responsive to voice only.

## 2023-06-06 NOTE — ED Notes (Signed)
Nephew at bedside.

## 2023-06-06 NOTE — ED Notes (Signed)
MD at bedside to intubate. RT, charge, and float RN's present.

## 2023-06-06 NOTE — ED Notes (Signed)
Pauses noted on cardiac rhythm. MD notified.

## 2023-06-06 NOTE — ED Notes (Signed)
Spoke with [facility] ETA 20 minutes. Report given to Misty Stanley, [place]

## 2023-06-07 ENCOUNTER — Inpatient Hospital Stay (HOSPITAL_COMMUNITY): Payer: Medicare Other

## 2023-06-07 DIAGNOSIS — G935 Compression of brain: Secondary | ICD-10-CM | POA: Diagnosis not present

## 2023-06-07 DIAGNOSIS — I611 Nontraumatic intracerebral hemorrhage in hemisphere, cortical: Secondary | ICD-10-CM

## 2023-06-07 DIAGNOSIS — I6389 Other cerebral infarction: Secondary | ICD-10-CM

## 2023-06-07 DIAGNOSIS — Z515 Encounter for palliative care: Secondary | ICD-10-CM | POA: Diagnosis not present

## 2023-06-07 DIAGNOSIS — Z7189 Other specified counseling: Secondary | ICD-10-CM | POA: Diagnosis not present

## 2023-06-07 LAB — ECHOCARDIOGRAM COMPLETE
AR max vel: 3.84 cm2
AV Area VTI: 4.2 cm2
AV Area mean vel: 3.85 cm2
AV Mean grad: 4 mmHg
AV Peak grad: 7.1 mmHg
Ao pk vel: 1.33 m/s
Area-P 1/2: 4.15 cm2
Height: 76 in
S' Lateral: 3.4 cm
Weight: 3840 oz

## 2023-06-07 LAB — LIPID PANEL
Cholesterol: 116 mg/dL (ref 0–200)
HDL: 56 mg/dL (ref >40)
LDL Cholesterol: 53 mg/dL (ref 0–99)
Total CHOL/HDL Ratio: 2.1 RATIO
Triglycerides: 36 mg/dL (ref <150)
VLDL: 7 mg/dL (ref 0–40)

## 2023-06-07 LAB — GLUCOSE, CAPILLARY
Glucose-Capillary: 151 mg/dL — ABNORMAL HIGH (ref 70–99)
Glucose-Capillary: 162 mg/dL — ABNORMAL HIGH (ref 70–99)
Glucose-Capillary: 76 mg/dL (ref 70–99)
Glucose-Capillary: 77 mg/dL (ref 70–99)

## 2023-06-07 LAB — SODIUM
Sodium: 141 mmol/L (ref 135–145)
Sodium: 144 mmol/L (ref 135–145)

## 2023-06-07 LAB — TRIGLYCERIDES: Triglycerides: 42 mg/dL (ref <150)

## 2023-06-07 LAB — HEMOGLOBIN A1C
Hgb A1c MFr Bld: 6.2 % — ABNORMAL HIGH (ref 4.8–5.6)
Mean Plasma Glucose: 131.24 mg/dL

## 2023-06-07 MED ORDER — ORAL CARE MOUTH RINSE: 15.0000 mL | OROMUCOSAL | Status: DC | PRN

## 2023-06-07 MED ORDER — SODIUM CHLORIDE 0.9 % IV SOLN: INTRAVENOUS | Status: DC

## 2023-06-07 MED ORDER — ACETAMINOPHEN 325 MG PO TABS: 650.0000 mg | ORAL_TABLET | Freq: Four times a day (QID) | ORAL | Status: DC | PRN

## 2023-06-07 MED ORDER — ACETAMINOPHEN 650 MG RE SUPP: 650.0000 mg | Freq: Four times a day (QID) | RECTAL | Status: DC | PRN

## 2023-06-07 MED ORDER — GLYCOPYRROLATE 0.2 MG/ML IJ SOLN
INTRAMUSCULAR | Status: AC
  Filled 2023-06-07: qty 1

## 2023-06-07 MED ORDER — MORPHINE BOLUS VIA INFUSION
5.0000 mg | INTRAVENOUS | Status: DC | PRN
  Administered 2023-06-09: 5 mg via INTRAVENOUS

## 2023-06-07 MED ORDER — ORAL CARE MOUTH RINSE
15.0000 mL | OROMUCOSAL | Status: DC
  Administered 2023-06-07 (×5): 15 mL via OROMUCOSAL

## 2023-06-07 MED ORDER — POLYVINYL ALCOHOL 1.4 % OP SOLN: 1.0000 [drp] | Freq: Four times a day (QID) | OPHTHALMIC | Status: DC | PRN

## 2023-06-07 MED ORDER — GLYCOPYRROLATE 0.2 MG/ML IJ SOLN: 0.2000 mg | INTRAMUSCULAR | Status: DC | PRN

## 2023-06-07 MED ORDER — GLYCOPYRROLATE 1 MG PO TABS: 1.0000 mg | ORAL_TABLET | ORAL | Status: DC | PRN

## 2023-06-07 MED ORDER — MORPHINE 100MG IN NS 100ML (1MG/ML) PREMIX INFUSION
0.0000 mg/h | INTRAVENOUS | Status: DC
  Administered 2023-06-07 – 2023-06-08 (×2): 3 mg/h via INTRAVENOUS
  Administered 2023-06-09 (×2): 10 mg/h via INTRAVENOUS
  Administered 2023-06-10: 20 mg/h via INTRAVENOUS
  Filled 2023-06-07 (×4): qty 100

## 2023-06-07 MED ORDER — MORPHINE 100MG IN NS 100ML (1MG/ML) PREMIX INFUSION
INTRAVENOUS | Status: AC
  Filled 2023-06-07: qty 100

## 2023-06-07 NOTE — Progress Notes (Signed)
RT and Rn x2 tranported pt on ventilator from 4N24 to CT and back without complications.

## 2023-06-07 NOTE — Progress Notes (Signed)
   NAME:  JANES COLEGROVE, MRN:  [id number], DOB:  05-04-1956, LOS: 1 ADMISSION DATE:  05/30/2023, CONSULTATION DATE: 7/18 REFERRING MD:  Dr Iver Nestle, CHIEF COMPLAINT: ICH  History of Present Illness:  67 year old male with past medical history as below, which is significant for hypertension, hyperlipidemia, diabetes mellitus, vascular dementia, schizophrenia, coronary artery disease, and atrial fibrillation on Eliquis.  He was recently admitted June 25 for acute encephalopathy which was ultimately determined to be secondary to urinary tract infection and improved with antibiotic administration.  He resides in a SNF where he was discharged to on 6/26.  He then presented to [facility] ED via EMS on 7/18 after being found down at his SNF.  Last known well at around 3 PM and arrived to the ED around 5 PM.  Upon arrival to the ED he would open eyes to voice, but not follow commands.  Moving right side not purposefully.  CT scan of the head described right-sided intercranial hemorrhage with intraventricular extension and evidence of uncal herniation.  Eliquis was reversed with Andexxa.  There was also evidence of seizure activity while in the emergency department.  This was treated effectively with benzodiazepine.  He was ultimately intubated for airway protection and transported to [facility] for ICU admission and EEG monitoring.  Pertinent  Medical History   has a past medical history of Anemia, Asthma, Atrial fibrillation (HCC), Benign prostatic hypertrophy with lower urinary tract symptoms (LUTS), Bulbous urethral stricture, CAD (coronary artery disease), CKD (chronic kidney disease), Constipation, COPD (chronic obstructive pulmonary disease) (HCC), Diabetes mellitus without complication (HCC), Dizziness, Dyspnea, GERD (gastroesophageal reflux disease), Gross hematuria, Hyperlipemia, Hypertension, Lumbago, Palpitation, Schizophrenia (HCC), and Vascular dementia (HCC).   Significant Hospital  Events: Including procedures, antibiotic start and stop dates in addition to other pertinent events     Interim History / Subjective:  Comatose on vent but on sedation  Objective   Blood pressure (!) 148/82, pulse (!) 51, temperature 99.9 F (37.7 C), temperature source Bladder, resp. rate 20, SpO2 100%.    Vent Mode: PRVC FiO2 (%):  [40 %] 40 % Set Rate:  [20 bmp] 20 bmp Vt Set:  [520 mL] 520 mL PEEP:  [5 cmH20] 5 cmH20 Plateau Pressure:  [10 cmH20-17 cmH20] 16 cmH20   Intake/Output Summary (Last 24 hours) at 06/07/2023 0847 Last data filed at 06/07/2023 0800 Gross per 24 hour  Intake 892.87 ml  Output 325 ml  Net 567.87 ml   There were no vitals filed for this visit.  Examination: Poorly responsive on vent Flicker movement R Pupils small, look equal to me Lungs clear Abd soft Heart sounds regular  CT head personally reviewed  Resolved Hospital Problem list     Assessment & Plan:  Large intracranial hemorrhage with brain compression- likely hypertensive in origin.  Baseline vascular dementia, schizophrenia with FTT.  Agree with considering transition to comfort measures as there will be no quality of life should he survive this. - Continue vent support, sedation as ordered - Gaurdian to be contacted by primary - Should we decide on comfort would start morphine drip titrated to RR<20 and/or signs of distress   Best Practice (right click and "Reselect all SmartList Selections" daily)   Diet/type: NPO DVT prophylaxis: not indicated GI prophylaxis: H2B Lines: N/A Foley:  Yes, and it is still needed Code Status:  full code Last date of multidisciplinary goals of care discussion [ today ]  31 min cc time Myrla Halsted MD PCCM

## 2023-06-07 NOTE — Consult Note (Signed)
Neurosurgery Consultation  Reason for Consult: ICH Referring Physician: Bhagat  CC: AMS  HPI: This is a [age] y.o. man with a history of vascular dementia, also on apixiban for Afib, reportedly was declining prior to this event, that now presents after being found down at his SNF, was not following commands and was intubated for airway protection in the ED, apixiban was reversed, some question of possible seizure activity but semiology is unknown. CTH was obtained which showed a large ICH, prompting neurosurgical consultation. The patient is unable to provide further history due to depressed mental status.    ROS: A 14 point ROS was performed and is negative except as noted in the HPI.   PMHx:  Past Medical History:  Diagnosis Date   Anemia    Asthma    Atrial fibrillation (HCC)    Benign prostatic hypertrophy with lower urinary tract symptoms (LUTS)    Bulbous urethral stricture    CAD (coronary artery disease)    s/p coronary stent 2003   CKD (chronic kidney disease)    Constipation    COPD (chronic obstructive pulmonary disease) (HCC)    Diabetes mellitus without complication (HCC)    type 2   Dizziness    Dyspnea    GERD (gastroesophageal reflux disease)    Gross hematuria    Hyperlipemia    Hypertension    Lumbago    Palpitation    Schizophrenia (HCC)    Vascular dementia (HCC)    FamHx:  Family History  Problem Relation Age of Onset   Diabetes Other    Bladder Cancer Neg Hx    Kidney cancer Neg Hx    Prostate cancer Neg Hx    SocHx:  reports that he has quit smoking. He has never used smokeless tobacco. He reports that he does not drink alcohol and does not use drugs.  Exam: Vital signs in last 24 hours: Temp:  [96 F (35.6 C)-100 F (37.8 C)] 100 F (37.8 C) (07/19 0900) Pulse Rate:  [41-109] 55 (07/19 0900) Resp:  [0-24] 18 (07/19 0900) BP: (104-202)/(64-122) 139/87 (07/19 0900) SpO2:  [96 %-100 %] 100 % (07/19 0900) FiO2 (%):  [40 %] 40 % (07/19  0800) Weight:  [108.9 kg] 108.9 kg June 29, 2023 1757) General: Lying in ICU bed, appears acutely ill Head: Normocephalic and atruamatic HEENT: Neck supple Pulmonary: Intubated, good chest rise bilaterally Cardiac: bradycardic in the 50s Abdomen: S NT ND Extremities: Warm and well perfused x4 Neuro: Intubated, eyes closed to painful stim, +left gaze, pupils reactive, +corneal / cough, no response to painful stim x4   Assessment and Plan: 67 y.o. man w/ vascular dementia and AMS. CTH personally reviewed, which shows a large R lobar ICH with extension to the cortical surface and into the ventricle.   -no neurosurgical intervention indicated, discussed with pt's cousin at bedside  -please call with any concerns or questions  Jadene Pierini, MD 06/07/23 10:00 AM [facility]

## 2023-06-07 NOTE — Plan of Care (Cosign Needed Addendum)
Goals of Care Discussion     Date carried out:: 06/07/23    Location of the meeting: Outside 4N24   Member's involved: Dr. Pearlean Brownie, Dr. Theron Arista, NP, Beverely Low, RN   Durable Power of Attorney or acting medical decision maker:    Discussion:   Dr. Pearlean Brownie and Dr. Katrinka Blazing both spoke, at length, to guardian, Candace Corbelle ([phone]). She confirmed patient should be made a DNR and to allow the family time to come in to say goodbye, then transition to comfort care.      Code status: DNR, transition to comfort care when family is ready    Disposition: Transfer out of ICU after transition to comfort care.      Time spent for the meeting: 20 minutes     Note/plan to be edited by MD as needed.    Lynnae January, DNP, AGACNP-BC [facility] Please use AMION for contact information & EPIC for messaging.

## 2023-06-07 NOTE — Plan of Care (Signed)
Problem: Education: Goal: Knowledge of General Education information will improve Description: Including pain rating scale, medication(s)/side effects and non-pharmacologic comfort measures Outcome: Not Met (add Reason)   Problem: Health Behavior/Discharge Planning: Goal: Ability to manage health-related needs will improve Outcome: Not Met (add Reason)   Problem: Clinical Measurements: Goal: Ability to maintain clinical measurements within normal limits will improve Outcome: Not Met (add Reason) Goal: Will remain free from infection Outcome: Not Met (add Reason) Goal: Diagnostic test results will improve Outcome: Not Met (add Reason) Goal: Respiratory complications will improve Outcome: Not Met (add Reason) Goal: Cardiovascular complication will be avoided Outcome: Not Met (add Reason)   Problem: Activity: Goal: Risk for activity intolerance will decrease Outcome: Not Met (add Reason)   Problem: Nutrition: Goal: Adequate nutrition will be maintained Outcome: Not Met (add Reason)   Problem: Coping: Goal: Level of anxiety will decrease Outcome: Not Met (add Reason)   Problem: Elimination: Goal: Will not experience complications related to bowel motility Outcome: Not Met (add Reason) Goal: Will not experience complications related to urinary retention Outcome: Not Met (add Reason)   Problem: Pain Managment: Goal: General experience of comfort will improve Outcome: Not Met (add Reason)   Problem: Safety: Goal: Ability to remain free from injury will improve Outcome: Not Met (add Reason)   Problem: Skin Integrity: Goal: Risk for impaired skin integrity will decrease Outcome: Not Met (add Reason)   Problem: Education: Goal: Knowledge of disease or condition will improve Outcome: Not Met (add Reason) Goal: Knowledge of secondary prevention will improve (MUST DOCUMENT ALL) Outcome: Not Met (add Reason) Goal: Knowledge of patient specific risk factors will improve  Loraine Leriche N/A or DELETE if not current risk factor) Outcome: Not Met (add Reason)   Problem: Ischemic Stroke/TIA Tissue Perfusion: Goal: Complications of ischemic stroke/TIA will be minimized Outcome: Not Met (add Reason)   Problem: Coping: Goal: Will verbalize positive feelings about self Outcome: Not Met (add Reason) Goal: Will identify appropriate support needs Outcome: Not Met (add Reason)   Problem: Health Behavior/Discharge Planning: Goal: Ability to manage health-related needs will improve Outcome: Not Met (add Reason) Goal: Goals will be collaboratively established with patient/family Outcome: Not Met (add Reason)   Problem: Self-Care: Goal: Ability to participate in self-care as condition permits will improve Outcome: Not Met (add Reason) Goal: Verbalization of feelings and concerns over difficulty with self-care will improve Outcome: Not Met (add Reason) Goal: Ability to communicate needs accurately will improve Outcome: Not Met (add Reason)   Problem: Nutrition: Goal: Risk of aspiration will decrease Outcome: Not Met (add Reason) Goal: Dietary intake will improve Outcome: Not Met (add Reason)   Problem: Education: Goal: Knowledge of disease or condition will improve Outcome: Not Met (add Reason) Goal: Knowledge of secondary prevention will improve (MUST DOCUMENT ALL) Outcome: Not Met (add Reason) Goal: Knowledge of patient specific risk factors will improve Loraine Leriche N/A or DELETE if not current risk factor) Outcome: Not Met (add Reason)   Problem: Intracerebral Hemorrhage Tissue Perfusion: Goal: Complications of Intracerebral Hemorrhage will be minimized Outcome: Not Met (add Reason)   Problem: Coping: Goal: Will verbalize positive feelings about self Outcome: Not Met (add Reason) Goal: Will identify appropriate support needs Outcome: Not Met (add Reason)   Problem: Health Behavior/Discharge Planning: Goal: Ability to manage health-related needs will  improve Outcome: Not Met (add Reason) Goal: Goals will be collaboratively established with patient/family Outcome: Not Met (add Reason)   Problem: Self-Care: Goal: Ability to participate in self-care as condition permits will improve  Outcome: Not Met (add Reason) Goal: Verbalization of feelings and concerns over difficulty with self-care will improve Outcome: Not Met (add Reason) Goal: Ability to communicate needs accurately will improve Outcome: Not Met (add Reason)   Problem: Nutrition: Goal: Risk of aspiration will decrease Outcome: Not Met (add Reason) Goal: Dietary intake will improve Outcome: Not Met (add Reason)   Problem: Education: Goal: Ability to describe self-care measures that may prevent or decrease complications (Diabetes Survival Skills Education) will improve Outcome: Not Met (add Reason) Goal: Individualized Educational Video(s) Outcome: Not Met (add Reason)   Problem: Coping: Goal: Ability to adjust to condition or change in health will improve Outcome: Not Met (add Reason)   Problem: Fluid Volume: Goal: Ability to maintain a balanced intake and output will improve Outcome: Not Met (add Reason)   Problem: Health Behavior/Discharge Planning: Goal: Ability to identify and utilize available resources and services will improve Outcome: Not Met (add Reason) Goal: Ability to manage health-related needs will improve Outcome: Not Met (add Reason)   Problem: Metabolic: Goal: Ability to maintain appropriate glucose levels will improve Outcome: Not Met (add Reason)  Comfort patient

## 2023-06-07 NOTE — Progress Notes (Deleted)
RT and Rn x2 Transported pt 4N24 on ventilator, to CT and back without complications.

## 2023-06-07 NOTE — Consult Note (Signed)
Palliative Medicine Inpatient Consult Note  Consulting Provider:  Gordy Councilman, MD   Reason for consult:   Palliative Care Consult Services Palliative Medicine Consult  Reason for Consult? goals of care, ICH   06/07/2023  HPI:  Per intake H&P -->  Brian Dodson is a 67 y.o. male with past medical history of atrial fibrillation on Eliquis, coronary artery disease, CKD, COPD, diabetes, hyperlipidemia, hypertension, schizophrenia, vascular dementia   Palliative care asked to get involved in the setting of ICH to help with goals of care conversations - family had already opted for full comfort measures. We plan to provide additional family support and symptom management  Clinical Assessment/Goals of Care:  *Please note that this is a verbal dictation therefore any spelling or grammatical errors are due to the "Dragon Medical One" system interpretation.  I have reviewed medical records including EPIC notes, labs and imaging, received report from bedside RN, assessed the patient who is resting comfortably on a morphine gtt.    I met with seven of patients nieces, nephews, and cousins to further discuss diagnosis prognosis, GOC, EOL wishes, disposition and options.   I introduced Palliative Medicine as specialized medical care for people living with serious illness. It focuses on providing relief from the symptoms and stress of a serious illness. The goal is to improve quality of life for both the patient and the family.  Discussion:  We reviewed conversations had to date and how Brian Dodson suffered a large intracranial hemorrhage with brain compression likely in the setting of high blood pressure. We discussed sadly patients likely outcomes overall had anything but comfort been pursued.  Patients family and I discussed comfort measures in greater detail. We talked about transition to comfort measures in house and what that would entail inclusive of medications to control pain, dyspnea,  agitation, nausea, itching, and hiccups.  We discussed stopping all uneccessary measures such as cardiac monitoring, blood draws, needle sticks, and frequent vital signs. Family vocalize understanding and awareness of this emphasis.   Patients niece shares that his mother is awaiting him in heaven. Offered time for them to share their thoughts. Family all appear at peace with patients present state. I was able to provide a "Gone from my Sight" booklet to support them further. I provided information on end of life processes and what to expect.  __________________  Addendum:  I was able to call and speak to patients guardian, Candace and answer additional questions that she had. She was very thankful for the medical care that Brian Dodson has received.   Decision Maker: Corbelle,Candace (Other): [phone] (Work Phone)   SUMMARY OF RECOMMENDATIONS   DNAR/DNI  Comfort Care  Continue morphine gtt  Additional medications per [facility]  Unrestricted visitation  Ongoing PMT support  Code Status/Advance Care Planning: DNAR/DNI  Palliative Prophylaxis:  Aspiration, Bowel Regimen, Delirium Protocol, Frequent Pain Assessment, Oral Care, Palliative Wound Care, and Turn Reposition  Additional Recommendations (Limitations, Scope, Preferences): Comfort Measures  Psycho-social/Spiritual:  Desire for further Chaplaincy support: Yes Additional Recommendations: Education on end of life care   Prognosis: Hours to days  Discharge Planning: Discharge will be [facility]  Vitals:   06/07/23 1300 06/07/23 1400  BP:    Pulse: 64 62  Resp: 18 16  Temp: 100.2 F (37.9 C) 99.9 F (37.7 C)  SpO2: 94% 97%    Intake/Output Summary (Last 24 hours) at 06/07/2023 1625 Last data filed at 06/07/2023 1600 Gross per 24 hour  Intake 1267.88 ml  Output 325 ml  Net 942.88 ml   Gen:  Elderly AA M in NAD HEENT: moist mucous membranes CV: Regular rate and rhythm  PULM: On RA, breathing is even and  nonlabored ABD: soft/nontender  EXT: No edema  Neuro: Somonolent  PPS: 10%    Billing based on MDM: High ______________________________________________________ Lamarr Lulas [place] Palliative Medicine Team Team Cell Phone: [phone] Please utilize secure chat with additional questions, if there is no response within 30 minutes please call the above phone number  Palliative Medicine Team providers are available by phone from 7am to 7pm daily and can be reached through the team cell phone.  Should this patient require assistance outside of these hours, please call the patient's attending physician.

## 2023-06-07 NOTE — H&P (Signed)
Neurology H&P  CC: Found down, ICH  History is obtained from: Chart review  HPI: Brian Dodson is a 67 y.o. male with past medical history of atrial fibrillation on Eliquis, coronary artery disease, CKD, COPD, diabetes, hyperlipidemia, hypertension, schizophrenia, vascular dementia (but high functioning at baseline, ambulates and takes care of his ADLs, oriented to self, family members and place but not time)  He was last seen by neurology on 05/14/2023 for altered mental status, felt to be encephalopathy secondary to UTI, discharged back to his facility on 6/26 on antibiotics.  This morning he was found poorly responsive on the floor at his assisted living facility (per verbal report in another resident's room and therefore may have been confused prior to his fall, as he does not typically visit others).  He was brought to [facility] hospital for evaluation where head CT revealed a large right parietotemporal hematoma.  He was reversed with Andexxa, started on Cleviprex as well as hypertonic saline, and intubated for GCS score of 7.  Per ED providers discussion with [place] appointed legal guardian "he would want everything done if there is a chance he would be able to recover" and therefore decision was made to transfer to [facility] for further management of his ICH  During transport with [facility] there was concern for seizure activity for which Versed was administered, gaze deviation (unclear to which side) and head shaking per verbal report. No further seizure activity since arrival   Anticoagulant: Eliquis 5 mg BID Last dose: 05/27/2023 at 0800  LKW: 3 PM Thrombolytic given?: No, ICH IA performed?: No, ICH Premorbid modified rankin scale:      3 - Moderate disability. Requires some help, but able to walk unassisted.  ICH Score: 3   Time performed: ~7 PM GCS: 5-12 is 1 point based on initial GCS 7 per EDP Dr. Derrill Kay Infratentorial: No.. If yes, 1 point (0) Volume: >30cc is 1 point  Age:  67 y.o.. >80 is 1 point (0) Intraventricular extension is 1 point (1) A Score of 3 points has a 30 day mortality of 72%. Stroke. 2001 Apr;32(4):891-7.    ROS: Unable to obtain due to altered mental status.   Past Medical History:  Diagnosis Date   Anemia    Asthma    Atrial fibrillation (HCC)    Benign prostatic hypertrophy with lower urinary tract symptoms (LUTS)    Bulbous urethral stricture    CAD (coronary artery disease)    s/p coronary stent 2003   CKD (chronic kidney disease)    Constipation    COPD (chronic obstructive pulmonary disease) (HCC)    Diabetes mellitus without complication (HCC)    type 2   Dizziness    Dyspnea    GERD (gastroesophageal reflux disease)    Gross hematuria    Hyperlipemia    Hypertension    Lumbago    Palpitation    Schizophrenia (HCC)    Vascular dementia [facility])    Past Surgical History:  Procedure Laterality Date   CATARACT EXTRACTION W/PHACO Right 01/16/2022   Procedure: CATARACT EXTRACTION PHACO AND INTRAOCULAR LENS PLACEMENT (IOC) RIGHT DIABETIC 4.48 00:34.3;  Surgeon: Galen Manila, MD;  Location: [facility];  Service: Ophthalmology;  Laterality: Right;  Diabetic   CATARACT EXTRACTION W/PHACO Left 03/13/2022   Procedure: CATARACT EXTRACTION PHACO AND INTRAOCULAR LENS PLACEMENT (IOC) LEFT DIABETIC malyugin 8.39 01:02.9 ;  Surgeon: Galen Manila, MD;  Location: [facility];  Service: Ophthalmology;  Laterality: Left;  Diabetic  COLONOSCOPY     COLONOSCOPY WITH PROPOFOL N/A 09/22/2021   Procedure: COLONOSCOPY WITH PROPOFOL;  Surgeon: Regis Bill, MD;  Location: [facility];  Service: Endoscopy;  Laterality: N/A;   COLONOSCOPY WITH PROPOFOL N/A 01/02/2022   Procedure: COLONOSCOPY WITH PROPOFOL;  Surgeon: Regis Bill, MD;  Location: [facility];  Service: Endoscopy;  Laterality: N/A;   CORONARY ANGIOPLASTY WITH STENT PLACEMENT  11/2003   kidney stent     TEE WITHOUT CARDIOVERSION N/A 09/20/2021    Procedure: TRANSESOPHAGEAL ECHOCARDIOGRAM (TEE);  Surgeon: Dalia Heading, MD;  Location: [facility];  Service: Cardiovascular;  Laterality: N/A;   Current Outpatient Medications  Medication Instructions   acetaminophen (TYLENOL) 1,000 mg, Oral, 3 times daily   amiodarone (PACERONE) 200 mg, Oral, Daily   aspirin 81 mg, Oral, Daily   atorvastatin (LIPITOR) 10 mg, Oral, Daily   benztropine (COGENTIN) 1 mg, Oral, Daily at bedtime   brimonidine-timolol (COMBIGAN) 0.2-0.5 % ophthalmic solution 1 drop, Both Eyes, 2 times daily, Wait 3 to 5 minutes between drops   citalopram (CELEXA) 20 mg, Oral, Daily   divalproex (DEPAKOTE ER) 1,000 mg, Oral, 2 times daily   donepezil (ARICEPT) 10 mg, Oral, Daily at bedtime   Eliquis 5 mg, Oral, 2 times daily   ferrous sulfate 325 mg, Oral, Daily with breakfast, (Hold for 1 week, resume on 06/25/22)   lanolin/mineral oil (KERI/THERA-DERM) LOTN 1 application , As needed   loratadine (CLARITIN) 10 mg, Oral, Daily   LUMIGAN 0.01 % SOLN 1 drop, Both Eyes, Daily at bedtime   Magnesium Oxide 400 mg, Oral, Daily, (Hold for 1 week, resume on 06/25/22)   melatonin 10 mg, Oral, Daily at bedtime   Menthol, Topical Analgesic, (BIOFREEZE) 4 % GEL 1 application , Apply externally, 3 times daily, To lower back area as needed for pain   metFORMIN (GLUCOPHAGE) 500 mg, Oral, 2 times daily with meals   omeprazole (PRILOSEC) 20 mg, Oral, 2 times daily before meals, May open and sprinkle in applesauce.   Oyster Shell (OYSTER CALCIUM) 500 MG TABS tablet 500 mg of elemental calcium, Oral, 2 times daily   risperidone (RISPERDAL) 4 mg, Oral, Daily at bedtime   Skin Protectants, Misc. (MINERIN CREME) CREA 1 Application, Topical, Every morning, Apply to arms and legs   sodium chloride (OCEAN) 0.65 % SOLN nasal spray 1 spray, Each Nare, Daily   zinc oxide 20 % ointment 1 Application, Topical, 2 times daily PRN     Family History  Problem Relation Age of Onset   Diabetes Other     Bladder Cancer Neg Hx    Kidney cancer Neg Hx    Prostate cancer Neg Hx     Social History:  reports that he has quit smoking. He has never used smokeless tobacco. He reports that he does not drink alcohol and does not use drugs.   Exam: Current vital signs: BP 121/78   Pulse 70   Temp (!) 97.3 F (36.3 C)   Resp 20   SpO2 100%  Vital signs in last 24 hours: Temp:  [96 F (35.6 C)-97.8 F (36.6 C)] 97.3 F (36.3 C) (07/19 0012) Pulse Rate:  [41-109] 70 (07/19 0012) Resp:  [11-24] 20 (07/19 0012) BP: (119-202)/(68-122) 121/78 (07/19 0012) SpO2:  [96 %-100 %] 100 % (07/19 0012) FiO2 (%):  [40 %] 40 % (07/19 0012) Weight:  [108.9 kg] 108.9 kg 2023-06-08 1757)   Physical Exam  Constitutional: Appears well-developed and well-nourished.  Psych: Affect minimally interactive  Eyes: Mild left eye scleral edema. Left eye exotropia at baseline HENT: ETT in place, comfortable on the ventilator MSK: no major joint deformities.  Cardiovascular: Normal rate and regular rhythm. Respiratory: Effort normal, non-labored breathing GI: Soft.  No distension. There is no tenderness.  Skin: Warm dry and intact visible skin  Neuro: Mental Status: Does not open eyes spontaneously, to voice or noxious stimulation Does not follow any commands Cranial Nerves: II: No blink to threat. Left pupil 4 -> 2 mm, right pupil 2 -> 1 mm both sluggish III,IV, VI/VIII: EOMI to VOR, baseline left eye exotropia V/VII: Facial sensation is symmetric to eyelash brush VIII: No response to voice X/XI: Intact cough/gag XII: Unable to assess tongue protrusion secondary to patient's mental status  Motor/Sensory: Localizes with the RUE, flexion in the other 3 extremities  Cerebellar/Gait Unable to assess secondary to patient's mental status   NIHSS total 23  I have reviewed labs in epic and the results pertinent to this consultation are:  Basic Metabolic Panel: Recent Labs  Lab Jun 11, 2023 1718 Jun 11, 2023 2201   NA 135 140  139  K 4.6 4.7  CL 106  --   CO2 25  --   GLUCOSE 117*  --   BUN 21  --   CREATININE 1.10  --   CALCIUM 8.4*  --     CBC: Recent Labs  Lab June 11, 2023 1718 June 11, 2023 2201  WBC 4.1  --   NEUTROABS 1.3*  --   HGB 10.6* 11.9*  HCT 31.7* 35.0*  MCV 97.5  --   PLT 145*  --     Coagulation Studies: Recent Labs    06/11/23 1718  LABPROT 14.5  INR 1.1      I have reviewed the images obtained:  MRI brain 05/14/2023 1. No acute intracranial process. 2. Hemosiderin deposition in bilateral cerebral sulci, compatible with sequela of prior subarachnoid hemorrhage and superficial siderosis, some of which are new from the 2022 MRI. Additional areas of hemosiderin deposition in the left temporal lobe and left occipital lobe are also new from the prior exam, consistent with interval hemorrhage.  Head CT personally reviewed, agree with radiology:  1. Approximate 65 cc parenchymal hematoma centered in the right parietotemporal region with evidence of intraventricular extension and likely early uncal herniation on the right. 2. Near-complete effacement of the right lateral ventricular system. Size and shape of the ventricular system is otherwise unchanged from prior exam.  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 Possible CAA    Treatment:   -Admit to ICU -ICH Score: 3 -ICH Volume: > 60 cc   -BP control goal SYS 130 - 150  -NSGY Consult for decompressive hemicraniotomy / EVD  -PT/OT/ST when stabilized  -neuromonitoring  CNS Cerebral edema Compression of  brain Developing obstructive hydrocephalus -Hyperosmolar therapy goal Na 150 - 160 -NSGY consult (not a candidate for intervention) -Close neuro monitoring  Dysarthria Dysphagia following ICH  -NPO until cleared by speech -ST -Advance diet as tolerated -May need PEG  Toxic encephalopathy Anoxic encephalopathy -Correct metabolic causes -Monitor  Hemiplegia and hemiparesis following nontraumatic intracerebral hemorrhage affecting left non-dominant side  -PT/OT/ST when stabilized  RESP Acute Respiratory  Failure  -vent management per ICU -wean when able  CV Essential (primary) hypertension Hypertensive Emergency -Aggressive BP control, goal SBP 130 - 150 -Titrate oral agents  Heart failure, unspecified  -TTE -Continue BB   Atrial fibrillation -Rate control -Continue BB -Hold anticoagulation  GI/GU -No active issues  HEME Anemia  -Monitor -transfuse for hgb < 7  Coagulopathy secondary to anticoagulation -Reversed with Andexxa  ENDO Type 2 diabetes mellitus w/o complications . Type 2 diabetes mellitus with hyperglycemia  -SSI -Start oral meds -goal HgbA1c < 7  Fluid/Electrolyte Disorders -Replete as needed -Trend  ID Possible Aspiration PNA -CXR -NPO -Monitor  Nutrition E66.9 Obesity  -Hold pending goals of care  Prophylaxis DVT: SCD GI: Protonix Bowel: Senna  Dispo: Pending clinical course Diet: NPO until cleared by speech  Code Status: Full Code  - Palliative care consult  THE FOLLOWING WERE PRESENT ON ADMISSION: CNS -  Possible Seizures, COMA, Cerebral Herniation, Cerebral Edema, Likely Obstructive Hydrocephalus, ICH, Hemiparesis, Loss of Consciousness,  Respiratory - Respiratory failure, Probable Aspiration Pneumonia, Mech Ventilation (Intubated) Cardiovascular - Hypertensive Emergency Urgency,  Heme-  Coagulopathy secondary to Eliquis, Thrombotic state secondary to Andexxa    Brooke Dare MD-PhD [facility] [phone] Available 7 PM to 7 AM, outside of these hours please call Neurologist on call as listed on Amion.    Total critical care time: 45 minutes   Critical care time was exclusive of separately billable procedures and treating other patients.   Critical care was necessary to treat or prevent imminent or life-threatening deterioration.   Critical care was time spent personally by me on the following activities: development of treatment plan with patient and/or surrogate as well as nursing, discussions with consultants/primary team, evaluation of patient's response to treatment, examination of patient, obtaining history from patient or surrogate, ordering and performing treatments and interventions, ordering and review of laboratory studies, ordering and review of radiographic studies, and re-evaluation of patient's condition as needed, as documented above.

## 2023-06-07 NOTE — Progress Notes (Addendum)
STROKE TEAM PROGRESS NOTE   BRIEF HPI [name] is a 67 y.o. male with history of Afib on Eliquis, CAD, COPD, DM, HLD, HTN, Vascular Dementia, Schizophrenia presenting to [facility] with altered mental status, where head CT revealed a large right lobar ICH with IV extension Reversed with Andexxa and started on Cleviprex for BP control, along with hypertonic saline, transferred to [facility].  Patient is from a facility and is a ward of the state with an appointed legal guardian.  LKW: 3 PM 06-17-2023 Thrombolytic given?: No, ICH IA performed?: No, ICH Premorbid modified rankin scale: 3 ICH score: 3 ICH volume: >60cc  SIGNIFICANT HOSPITAL EVENTS June 17, 2023: during transport to [facility], concern for seizure activity (gaze deviation and head shaking described)--Versed given 7/19: NS consult--no intervention at this time Patient made DNR by county guardian over phone (Dr. Pearlean Brownie and Dr. Katrinka Blazing both confirmed), transition to comfort care once family is ready.   INTERIM HISTORY/SUBJECTIVE On exam, patient is unresponsive, on minimal sedation meds/with meds turned off. Positive cough and gag, sluggish R corneal, trace ble withdrawal.  Assessment and plan of care discussed with niece at bedside, agrees with guardian's directives.   OBJECTIVE      CBC    Component Value Date/Time   WBC 4.1 Jun 17, 2023 1718   RBC 3.25 (L) Jun 17, 2023 1718   HGB 11.9 (L) 2023/06/17 2201   HGB 9.4 (L) 02/14/2015 1427   HCT 35.0 (L) 06/17/23 2201   HCT 29.6 (L) 02/14/2015 1427   PLT 145 (L) 06-17-2023 1718   PLT 106 (L) 02/14/2015 1427   MCV 97.5 06-17-23 1718   MCV 101 (H) 02/14/2015 1427   MCH 32.6 2023-06-17 1718   MCHC 33.4 06/17/2023 1718   RDW 14.6 2023/06/17 1718   RDW 15.3 (H) 02/14/2015 1427   LYMPHSABS 2.3 June 17, 2023 1718   LYMPHSABS 2.8 02/14/2015 1427   MONOABS 0.3 June 17, 2023 1718   MONOABS 0.5 02/14/2015 1427   EOSABS 0.2 17-Jun-2023 1718   EOSABS 0.1 02/14/2015 1427   BASOSABS 0.0 June 17, 2023 1718   BASOSABS 0.0  02/14/2015 1427    BMET    Component Value Date/Time   NA 140 2023-06-17 2201   NA 139 06-17-2023 2201   NA 138 02/14/2015 1427   K 4.7 Jun 17, 2023 2201   K 4.3 02/14/2015 1427   CL 106 06/17/2023 1718   CL 104 02/14/2015 1427   CO2 25 06/17/23 1718   CO2 30 02/14/2015 1427   GLUCOSE 117 (H) 06-17-2023 1718   GLUCOSE 86 02/14/2015 1427   BUN 21 06-17-2023 1718   BUN 33 (H) 02/14/2015 1427   CREATININE 1.10 2023-06-17 1718   CREATININE 1.12 02/14/2015 1427   CALCIUM 8.4 (L) 06/17/2023 1718   CALCIUM 9.0 02/14/2015 1427   GFRNONAA >60 17-Jun-2023 1718   GFRNONAA >60 02/14/2015 1427    IMAGING past 24 hours ECHOCARDIOGRAM COMPLETE  Result Date: 06/07/2023    ECHOCARDIOGRAM REPORT   Patient Name:   Brian Dodson Date of Exam: 06/07/2023 Medical Rec #:  [id number]     Height:       76.0 in Accession #:    [id number]    Weight:       240.0 lb Date of Birth:  Jun 24, 1956    BSA:          2.394 m Patient Age:    66 years      BP:           143/85 mmHg Patient Gender: M  HR:           62 bpm. Exam Location:  Inpatient Procedure: 2D Echo, Cardiac Doppler and Color Doppler Indications:    Stroke I63.9  History:        Patient has prior history of Echocardiogram examinations, most                 recent 07/23/2021. CAD, Stroke and CKD; Intracranial Hemorrhage;                 Risk Factors:Dyslipidemia, Diabetes, Hypertension and Former                 Smoker.  Sonographer:    Dondra Prader RVT RCS Referring Phys: [id number] SRISHTI L BHAGAT IMPRESSIONS  1. Left ventricular ejection fraction, by estimation, is 55 to 60%. The left ventricle has normal function. The left ventricle has no regional wall motion abnormalities. There is mild concentric left ventricular hypertrophy. Left ventricular diastolic parameters were normal.  2. Right ventricular systolic function is normal. The right ventricular size is normal.  3. The mitral valve is normal in structure. Trivial mitral valve regurgitation. No  evidence of mitral stenosis.  4. The aortic valve is tricuspid. There is mild calcification of the aortic valve. Aortic valve regurgitation is not visualized. Aortic valve sclerosis/calcification is present, without any evidence of aortic stenosis.  5. The inferior vena cava is dilated in size with >50% respiratory variability, suggesting right atrial pressure of 8 mmHg. FINDINGS  Left Ventricle: Left ventricular ejection fraction, by estimation, is 55 to 60%. The left ventricle has normal function. The left ventricle has no regional wall motion abnormalities. The left ventricular internal cavity size was normal in size. There is  mild concentric left ventricular hypertrophy. Left ventricular diastolic parameters were normal. Right Ventricle: The right ventricular size is normal. No increase in right ventricular wall thickness. Right ventricular systolic function is normal. Left Atrium: Left atrial size was normal in size. Right Atrium: Right atrial size was normal in size. Pericardium: There is no evidence of pericardial effusion. Mitral Valve: The mitral valve is normal in structure. Trivial mitral valve regurgitation. No evidence of mitral valve stenosis. Tricuspid Valve: The tricuspid valve is normal in structure. Tricuspid valve regurgitation is trivial. No evidence of tricuspid stenosis. Aortic Valve: The aortic valve is tricuspid. There is mild calcification of the aortic valve. Aortic valve regurgitation is not visualized. Aortic valve sclerosis/calcification is present, without any evidence of aortic stenosis. Aortic valve mean gradient measures 4.0 mmHg. Aortic valve peak gradient measures 7.1 mmHg. Aortic valve area, by VTI measures 4.20 cm. Pulmonic Valve: The pulmonic valve was normal in structure. Pulmonic valve regurgitation is trivial. No evidence of pulmonic stenosis. Aorta: The aortic root is normal in size and structure. Venous: The inferior vena cava is dilated in size with greater than 50%  respiratory variability, suggesting right atrial pressure of 8 mmHg. IAS/Shunts: No atrial level shunt detected by color flow Doppler.  LEFT VENTRICLE PLAX 2D LVIDd:         5.20 cm   Diastology LVIDs:         3.40 cm   LV e' medial:    10.10 cm/s LV PW:         1.20 cm   LV E/e' medial:  11.3 LV IVS:        1.30 cm   LV e' lateral:   12.80 cm/s LVOT diam:     2.60 cm   LV E/e' lateral: 8.9  LV SV:         116 LV SV Index:   49 LVOT Area:     5.31 cm  RIGHT VENTRICLE          IVC RV Basal diam:  3.60 cm  IVC diam: 2.10 cm LEFT ATRIUM             Index        RIGHT ATRIUM           Index LA diam:        3.70 cm 1.55 cm/m   RA Area:     18.50 cm LA Vol (A2C):   62.7 ml 26.19 ml/m  RA Volume:   50.90 ml  21.26 ml/m LA Vol (A4C):   68.6 ml 28.66 ml/m LA Biplane Vol: 70.9 ml 29.62 ml/m  AORTIC VALVE                    PULMONIC VALVE AV Area (Vmax):    3.84 cm     PV Vmax:       1.23 m/s AV Area (Vmean):   3.85 cm     PV Peak grad:  6.1 mmHg AV Area (VTI):     4.20 cm AV Vmax:           133.00 cm/s AV Vmean:          87.000 cm/s AV VTI:            0.277 m AV Peak Grad:      7.1 mmHg AV Mean Grad:      4.0 mmHg LVOT Vmax:         96.10 cm/s LVOT Vmean:        63.100 cm/s LVOT VTI:          0.219 m LVOT/AV VTI ratio: 0.79  AORTA Ao Root diam: 3.60 cm Ao Asc diam:  3.60 cm MITRAL VALVE MV Area (PHT): 4.15 cm     SHUNTS MV Decel Time: 183 msec     Systemic VTI:  0.22 m MV E velocity: 114.00 cm/s  Systemic Diam: 2.60 cm MV A velocity: 61.30 cm/s MV E/A ratio:  1.86 Arvilla Meres MD Electronically signed by Arvilla Meres MD Signature Date/Time: 06/07/2023/10:16:24 AM    Final    CT HEAD WO CONTRAST ( )  Result Date: 06/07/2023 CLINICAL DATA:  Follow-up ICH EXAM: CT HEAD WITHOUT CONTRAST TECHNIQUE: Contiguous axial images were obtained from the base of the skull through the vertex without intravenous contrast. RADIATION DOSE REDUCTION: This exam was performed according to the departmental dose-optimization  program which includes automated exposure control, adjustment of the mA and/or kV according to patient size and/or use of iterative reconstruction technique. COMPARISON:  Head CT from 1 day prior FINDINGS: Brain: Interval progression of hematoma centered in the right temporal lobe with occipital parietal extension, now measuring up to 10 cm anterior to posterior and 4 cm in thickness. Small volume intraventricular extension into the right lateral ventricle is unchanged. Progressive edema in the right cerebral hemisphere, superimposed on chronic small vessel ischemia. Leftward midline shift measures 14 mm and there is increased dilatation of the left lateral ventricle. Right uncal herniation. No new site of bleeding. Case obtained during [employer] downtime. Images were reviewed on the scanner without comparison and case discussed with Dr. Iver Nestle who was already aware. Vascular: No hyperdense vessel or unexpected calcification. Skull: Normal. Negative for fracture or focal lesion. Sinuses/Orbits: No acute finding. IMPRESSION: Worsening right cerebral hematoma  with edema, clot measuring up to 10 cm in length. Midline shift measures up to 14 mm and there is interval left lateral ventricular entrapment. Intraventricular blood clot remains small volume. Electronically Signed   By: Tiburcio Pea M.D.   On: 06/07/2023 06:27   DG Abd 1 View  Result Date: 2023/06/11 CLINICAL DATA:  Endotracheal tube placement. EXAM: ABDOMEN - 1 VIEW; PORTABLE CHEST - 1 VIEW COMPARISON:  2023/06/11. FINDINGS: The heart is normal in size and the mediastinal contour is within normal limits. No consolidation, effusion, or pneumothorax. An endotracheal tube terminates 2.3 cm above the carina. No acute osseous abnormality. The bowel gas pattern is normal in the visualized upper abdomen. The side port of the enteric tube is above the diaphragm and should be advanced 9 cm. IMPRESSION: 1. No acute cardiopulmonary process. 2. Side port of the  enteric tube is above the level of the diaphragm and should be advanced 9 cm. 3. Endotracheal tube terminates 2.3 cm above the carina. Electronically Signed   By: Thornell Sartorius M.D.   On: Jun 11, 2023 22:18   DG CHEST PORT 1 VIEW  Result Date: 2023-06-11 CLINICAL DATA:  Endotracheal tube placement. EXAM: ABDOMEN - 1 VIEW; PORTABLE CHEST - 1 VIEW COMPARISON:  06/11/2023. FINDINGS: The heart is normal in size and the mediastinal contour is within normal limits. No consolidation, effusion, or pneumothorax. An endotracheal tube terminates 2.3 cm above the carina. No acute osseous abnormality. The bowel gas pattern is normal in the visualized upper abdomen. The side port of the enteric tube is above the diaphragm and should be advanced 9 cm. IMPRESSION: 1. No acute cardiopulmonary process. 2. Side port of the enteric tube is above the level of the diaphragm and should be advanced 9 cm. 3. Endotracheal tube terminates 2.3 cm above the carina. Electronically Signed   By: Thornell Sartorius M.D.   On: 2023/06/11 22:18   DG Chest Portable 1 View  Result Date: 06-11-23 CLINICAL DATA:  Intubated EXAM: PORTABLE CHEST 1 VIEW COMPARISON:  05/14/2023 FINDINGS: Endotracheal tube tip just above the right bronchus. Esophageal tube tip below the diaphragm but incompletely visualized. Low lung volumes. No acute airspace disease. Cardiomediastinal silhouette within normal limits IMPRESSION: Endotracheal tube tip just above the right bronchus. Low lung volumes without acute airspace disease Electronically Signed   By: Jasmine Pang M.D.   On: 06-11-23 19:32   CT ANGIO HEAD CODE STROKE  Result Date: 2023/06/11 CLINICAL DATA:  Neuro deficit, acute, stroke suspected large R IPH, r/o aneurysm EXAM: CT ANGIOGRAPHY HEAD TECHNIQUE: Multidetector CT imaging of the head was performed using the standard protocol during bolus administration of intravenous contrast. Multiplanar CT image reconstructions and MIPs were obtained to evaluate the  vascular anatomy. RADIATION DOSE REDUCTION: This exam was performed according to the departmental dose-optimization program which includes automated exposure control, adjustment of the mA and/or kV according to patient size and/or use of iterative reconstruction technique. CONTRAST:  75mL OMNIPAQUE IOHEXOL 350 MG/ML SOLN COMPARISON:  Same day CT head. CT head and neck angiogram 05/14/2023 FINDINGS: Same day CT head for intracranial findings, including description of the large right parietal temporal region parenchymal hematoma. There is no evidence of active extravasation. CTA HEAD Anterior circulation: No significant stenosis, proximal occlusion, aneurysm, or vascular malformation. Posterior circulation: No significant stenosis, proximal occlusion, aneurysm, or vascular malformation. Venous sinuses: Not opacified Anatomic variants: None Review of the MIP images confirms the above findings. IMPRESSION: 1. No evidence of proximal occlusion or intracranial aneurysm. 2.  Same day CT head for intracranial findings, including description of the large right parietotemporal region parenchymal hematoma. No evidence of active extravasation. Electronically Signed   By: Lorenza Cambridge M.D.   On: 10-Jun-2023 17:59   CT HEAD CODE STROKE WO CONTRAST  Result Date: 10-Jun-2023 CLINICAL DATA:  Code stroke.  Neuro deficit, acute, stroke suspected EXAM: CT HEAD WITHOUT CONTRAST TECHNIQUE: Contiguous axial images were obtained from the base of the skull through the vertex without intravenous contrast. RADIATION DOSE REDUCTION: This exam was performed according to the departmental dose-optimization program which includes automated exposure control, adjustment of the mA and/or kV according to patient size and/or use of iterative reconstruction technique. COMPARISON:  CT Head 05/14/23, MR head 05/14/23 FINDINGS: Brain: Large 7.0 x 3.9 x 4.8 cm parenchymal hematoma centered in the right parietal temporal region. There is evidence of  intraventricular extension. There is near-complete effacement of the right lateral ventricular system. Size and shape of the ventricular system is otherwise unchanged from prior exam. No evidence of midline shift. There is likely early effacement of the basal cisterns on the right, suggestive of uncal herniation. No CT evidence of an acute cortical infarct. Vascular: No hyperdense vessel or unexpected calcification. Skull: Normal. Negative for fracture or focal lesion. Sinuses/Orbits: No middle ear or mastoid effusion. Paranasal sinuses are clear. Orbits are unremarkable. Other: None IMPRESSION: 1. Approximate 65 cc parenchymal hematoma centered in the right parietotemporal region with evidence of intraventricular extension and likely early uncal herniation on the right. 2. Near-complete effacement of the right lateral ventricular system. Size and shape of the ventricular system is otherwise unchanged from prior exam. Findings were discussed with Dr. Selina Cooley on 05/20/2023 at 5:38 PM. Electronically Signed   By: Lorenza Cambridge M.D.   On: 10-Jun-2023 17:39    Vitals:   06/07/23 0900 06/07/23 1000 06/07/23 1100 06/07/23 1200  BP: 139/87 (!) 143/85 (!) 149/86 (!) 150/87  Pulse: (!) 55 (!) 51 (!) 52 (!) 51  Resp: 18 20 20 20   Temp: 100 F (37.8 C) 100.2 F (37.9 C) 100.2 F (37.9 C) 100.2 F (37.9 C)  TempSrc:      SpO2: 100% 100% 100% 100%     PHYSICAL EXAM General:  Critically-ill patient intubated and sedated.  Comatose eyes closed. CV: SB on monitor Respiratory:  Intubated, on mechanical ventilation GI: Abdomen soft and nontender   NEURO: Patient is intubated and sedated.  Comatose.  Eyes closed patient does not open eyes to noxious stimuli. Sluggish right corneal, brisk left corneal reflex. Positive cough and gag. Trace withdrawal in lower extremities, no response in upper extremities.    ASSESSMENT/PLAN  Intracerebral Hemorrhage with IV extension:  right parieto-temporal region Etiology:   likely hypertensive bleed   Code Stroke CT head: Approximate 65 cc parenchymal hematoma centered in the right parietotemporal region with evidence of intraventricular extension and likely early uncal herniation on the righ. Near-complete effacement of the right ventricular system.  CTA head & neck: No evidence of proximal occlusion or intracranial aneurysm.  Repeat CT 7/19 early AM: Worsening right cerebral hematoma with edema, clot measuring up to 10cm in length. Midline shift up to 14mm with interval left ventricular entrapment.   2D Echo: LVEF 55-60%, Mild LVH, Trivial MVR.  LDL 50 HgbA1c 6.2 VTE prophylaxis - SCDs Eliquis (apixaban) daily and aspirin 81mg  prior to admission, now on No antithrombotic due to ICH/IVH.  Therapy recommendations:  pending Disposition:  comfort care  Hypertension Atrial fibrillation CAD 2003 Home Meds: eliquis,  amiodarone 200mg  Continue telemetry monitoring Eliquis on hold due to ICH/IVH Blood Pressure Goal: SBP less than 160   Hyperlipidemia Home meds:  lipitor 10mg  LDL 50, goal < 70 High intensity statin held due to lobar ICH  Diabetes type II Home meds:  metformin 500mg  BID HgbA1c 6.2, goal < 7.0 CBGs SSI  Tobacco Abuse Former smoker   Dysphagia Patient has post-stroke dysphagia, SLP consulted    Diet   Diet NPO time specified   Advance diet as tolerated  Other Stroke Risk Factors Obesity, BMI: 29.21, BMI >/= 30 associated with increased stroke risk, recommend weight loss, diet and exercise as appropriate.   Other Active Problems Vascular Dementia Home meds: Aricept, Cogentin   Hospital day # 1   Pt seen by Neuro NP/APP and later by MD. Note/plan to be edited by MD as needed.    Lynnae January, DNP, AGACNP-BC [facility] Please use AMION for contact information & EPIC for messaging.  STROKE MD NOTE :  I have personally obtained history,examined this patient, reviewed notes, independently viewed imaging studies,  participated in medical decision making and plan of care.ROS completed by me personally and pertinent positives fully documented  I have made any additions or clarifications directly to the above note. Agree with note above.  Patient with atrial fibrillation on Eliquis presented with altered mental status with CT scan showing a large right hemispheric parenchymal hemorrhage with volume 65 cc with 1.5 cm right to left midline shift and a very poor neurological exam.  Eliquis was reversed with Andexxa.  Chances of patient survival and making meaningful neurological improvement and living independently negligible.  Continue strict blood pressure control with systolic goal 130-150.  Had a long discussion with the bedside with the patient's niece as well as I spoke to [employer] who is the patient's guardian and Interior and spatial designer of Department of Social Services about his poor prognosis.  She agrees to DNR DNI and we await arrival of other family members before patient was made full comfort care measures and extubated.  Discussed with Dr. Katrinka Blazing critical care medicine This patient is critically ill and at significant risk of neurological worsening, death and care requires constant monitoring of vital signs, hemodynamics,respiratory and cardiac monitoring, extensive review of multiple databases, frequent neurological assessment, discussion with family, other specialists and medical decision making of high complexity.I have made any additions or clarifications directly to the above note.This critical care time does not reflect procedure time, or teaching time or supervisory time of PA/NP/Med Resident etc but could involve care discussion time.  I spent 30 minutes of neurocritical care time  in the care of  this patient.     Delia Heady, MD Medical Director [facility] Pager: [phone] 06/07/2023 2:46 PM   To contact Stroke Continuity provider, please refer to [email or web address]. After hours, contact General  Neurology

## 2023-06-07 NOTE — Progress Notes (Signed)
Nutrition Brief Note  Chart reviewed. Pt will be transitioning to comfort care after family has time to come in and say goodbye. No nutrition interventions planned at this time.  Please consult as needed.    Mertie Clause, MS, RD, LDN Inpatient Clinical Dietitian Please see AMiON for contact information.

## 2023-06-07 NOTE — Progress Notes (Signed)
PT Cancellation Note  Patient Details Name: Brian Dodson MRN: [id number] DOB: October 07, 1956   Cancelled Treatment:    Reason Eval/Treat Not Completed: Patient not medically ready. Pt with possible transition to comfort care.   Angelina Ok [facility] 06/07/2023, 11:20 AM Skip Mayer PT Acute Rehabilitation Services Office [phone]

## 2023-06-07 NOTE — Progress Notes (Signed)
SLP Cancellation Note  Patient Details Name: [name] MRN: [id number] DOB: [birth year]   Cancelled treatment:       Reason Eval/Treat Not Completed: Patient not medically ready (on vent). Will f/u for speech/language evaluation as able.     Mahala Menghini., M.A. CCC-SLP Acute Rehabilitation Services Office [phone]  Secure chat preferred  06/07/2023, 7:35 AM

## 2023-06-07 NOTE — Progress Notes (Signed)
*  PRELIMINARY RESULTS* Echocardiogram 2D Echocardiogram has been performed.  Brian Dodson 06/07/2023, 10:11 AM

## 2023-06-07 NOTE — Progress Notes (Signed)
Order planning on being DC as per Dr Iver Nestle

## 2023-06-07 NOTE — Procedures (Signed)
Extubation Procedure Note  Patient Details:   Name: Brian Dodson DOB: January 01, 1956 MRN: [id number]   Airway Documentation:  Airway 7.5 mm (Active)  Secured at (cm) 26 cm 06/07/23 1218  Measured From Lips 06/07/23 1218  Secured Location Right 06/07/23 1218  Secured By [employer] 06/07/23 1218  Tube Holder Repositioned Yes 06/07/23 1218  Prone position No 06/07/23 1218  Cuff Pressure (cm H2O) Green OR 18-26 CmH2O 06/07/23 0800  Site Condition Dry 06/07/23 1218   Vent end date: (not recorded) Vent end time: (not recorded)   Evaluation  O2 sats: currently acceptable Complications: No apparent complications Patient did tolerate procedure well. Bilateral Breath Sounds: Diminished, Clear   No Pt terminally extubated per MD order. Allen Norris 06/07/2023, 1:15 PM

## 2023-06-07 NOTE — Progress Notes (Signed)
OT Cancellation Note  Patient Details Name: ULICES MAACK MRN: [id number] DOB: 1956-02-03   Cancelled Treatment:    Reason Eval/Treat Not Completed: Active bedrest order. Will follow and see when appropriate and able.   Barry Brunner, OT Acute Rehabilitation Services Office [phone]   Chancy Milroy 06/07/2023, 8:10 AM

## 2023-06-08 DIAGNOSIS — I612 Nontraumatic intracerebral hemorrhage in hemisphere, unspecified: Secondary | ICD-10-CM | POA: Diagnosis not present

## 2023-06-08 DIAGNOSIS — Z515 Encounter for palliative care: Secondary | ICD-10-CM | POA: Diagnosis not present

## 2023-06-08 DIAGNOSIS — Z7189 Other specified counseling: Secondary | ICD-10-CM | POA: Diagnosis not present

## 2023-06-08 NOTE — TOC Progression Note (Signed)
Transition of Care [facility]) - Progression Note    Patient Details  Name: Brian Dodson MRN: [id number] Date of Birth: 27-Jul-1956  [facility] CM/SW Contact  Dellie Burns [place], [place] Phone Number: 06/08/2023, 4:24 PM  Clinical Narrative:  Per Palliative Medicine APP, pt's guardian Lyman Bishop [phone]) is agreeable to hospice home placement and requesting [facility]. Referral made to [facility] with Civil engineer, contracting. SW will follow.   Dellie Burns, MSW, LCSW [phone] (coverage)      Expected Discharge Plan: Hospice Medical Facility Barriers to Discharge: Hospice Bed not available  Expected Discharge Plan and Services                                               Social Determinants of Health (SDOH) Interventions SDOH Screenings   Food Insecurity: Patient Unable To Answer (05/14/2023)  Housing: Patient Unable To Answer (05/14/2023)  Transportation Needs: Patient Unable To Answer (05/14/2023)  Utilities: Patient Unable To Answer (05/14/2023)  Tobacco Use: Medium Risk (05/14/2023)    Readmission Risk Interventions    02/14/2022    1:19 PM 09/19/2021    3:16 PM  Readmission Risk Prevention Plan  Transportation Screening Complete Complete  PCP or Specialist Appt within 3-5 Days  Complete  HRI or Home Care Consult  Complete  Social Work Consult for Recovery Care Planning/Counseling  Complete  Palliative Care Screening  Not Applicable  Medication Review Oceanographer)  Complete  HRI or Home Care Consult Complete   SW Recovery Care/Counseling Consult Complete   Palliative Care Screening Not Applicable   Skilled Nursing Facility Not Applicable

## 2023-06-08 NOTE — Plan of Care (Signed)
Problem: Education: Goal: Knowledge of General Education information will improve Description: Including pain rating scale, medication(s)/side effects and non-pharmacologic comfort measures Outcome: Progressing   Problem: Health Behavior/Discharge Planning: Goal: Ability to manage health-related needs will improve Outcome: Progressing   Problem: Clinical Measurements: Goal: Ability to maintain clinical measurements within normal limits will improve Outcome: Progressing Goal: Will remain free from infection Outcome: Progressing Goal: Diagnostic test results will improve Outcome: Progressing Goal: Respiratory complications will improve Outcome: Progressing Goal: Cardiovascular complication will be avoided Outcome: Progressing   Problem: Activity: Goal: Risk for activity intolerance will decrease Outcome: Progressing   Problem: Nutrition: Goal: Adequate nutrition will be maintained Outcome: Progressing   Problem: Coping: Goal: Level of anxiety will decrease Outcome: Progressing   Problem: Elimination: Goal: Will not experience complications related to bowel motility Outcome: Progressing Goal: Will not experience complications related to urinary retention Outcome: Progressing   Problem: Pain Managment: Goal: General experience of comfort will improve Outcome: Progressing   Problem: Safety: Goal: Ability to remain free from injury will improve Outcome: Progressing   Problem: Skin Integrity: Goal: Risk for impaired skin integrity will decrease Outcome: Progressing   Problem: Education: Goal: Knowledge of disease or condition will improve Outcome: Progressing Goal: Knowledge of secondary prevention will improve (MUST DOCUMENT ALL) Outcome: Progressing Goal: Knowledge of patient specific risk factors will improve Loraine Leriche N/A or DELETE if not current risk factor) Outcome: Progressing   Problem: Ischemic Stroke/TIA Tissue Perfusion: Goal: Complications of ischemic  stroke/TIA will be minimized Outcome: Progressing   Problem: Coping: Goal: Will verbalize positive feelings about self Outcome: Progressing Goal: Will identify appropriate support needs Outcome: Progressing   Problem: Health Behavior/Discharge Planning: Goal: Ability to manage health-related needs will improve Outcome: Progressing Goal: Goals will be collaboratively established with patient/family Outcome: Progressing   Problem: Self-Care: Goal: Ability to participate in self-care as condition permits will improve Outcome: Progressing Goal: Verbalization of feelings and concerns over difficulty with self-care will improve Outcome: Progressing Goal: Ability to communicate needs accurately will improve Outcome: Progressing   Problem: Nutrition: Goal: Risk of aspiration will decrease Outcome: Progressing Goal: Dietary intake will improve Outcome: Progressing   Problem: Education: Goal: Knowledge of disease or condition will improve Outcome: Progressing Goal: Knowledge of secondary prevention will improve (MUST DOCUMENT ALL) Outcome: Progressing Goal: Knowledge of patient specific risk factors will improve Loraine Leriche N/A or DELETE if not current risk factor) Outcome: Progressing   Problem: Intracerebral Hemorrhage Tissue Perfusion: Goal: Complications of Intracerebral Hemorrhage will be minimized Outcome: Progressing   Problem: Coping: Goal: Will verbalize positive feelings about self Outcome: Progressing Goal: Will identify appropriate support needs Outcome: Progressing   Problem: Health Behavior/Discharge Planning: Goal: Ability to manage health-related needs will improve Outcome: Progressing Goal: Goals will be collaboratively established with patient/family Outcome: Progressing   Problem: Self-Care: Goal: Ability to participate in self-care as condition permits will improve Outcome: Progressing Goal: Verbalization of feelings and concerns over difficulty with  self-care will improve Outcome: Progressing Goal: Ability to communicate needs accurately will improve Outcome: Progressing   Problem: Nutrition: Goal: Risk of aspiration will decrease Outcome: Progressing Goal: Dietary intake will improve Outcome: Progressing   Problem: Education: Goal: Ability to describe self-care measures that may prevent or decrease complications (Diabetes Survival Skills Education) will improve Outcome: Progressing Goal: Individualized Educational Video(s) Outcome: Progressing   Problem: Coping: Goal: Ability to adjust to condition or change in health will improve Outcome: Progressing   Problem: Fluid Volume: Goal: Ability to maintain a balanced intake and output will  improve Outcome: Progressing   Problem: Health Behavior/Discharge Planning: Goal: Ability to identify and utilize available resources and services will improve Outcome: Progressing Goal: Ability to manage health-related needs will improve Outcome: Progressing   Problem: Metabolic: Goal: Ability to maintain appropriate glucose levels will improve Outcome: Progressing

## 2023-06-08 NOTE — Progress Notes (Addendum)
[facility] [phone] [facility] Liaison Note  Received request from [facility] [place]ie [place], [place] for transfer to [facility]. Eligibility confirmed  Spoke with Lenn Sink, legal guardian, to confirm interest and explain services. Discussion with Lamarr Lulas about discontinuing morphine infusion. Plan to keep infusion overnight and bolus patient prior to [facility]. Consents will need to be signed prior to transport being arranged.  Per Dr. Viviann Spare, can evaluate for possible transfer tomorrow. Candice, aware.  Will plan to follow up in the morning.  Thank you for the opportunity to participate in this patients care.  Haynes Bast, BSN, [employer] [phone]

## 2023-06-08 NOTE — Plan of Care (Signed)
Patient non-interactive. Patient progressing towards comfort goals. Patient appears comfortable at this time. Currently infusing Morphine gtt at 3mg /hr.   Problem: Education: Goal: Knowledge of General Education information will improve Description: Including pain rating scale, medication(s)/side effects and non-pharmacologic comfort measures Outcome: Progressing   Problem: Health Behavior/Discharge Planning: Goal: Ability to manage health-related needs will improve Outcome: Progressing   Problem: Clinical Measurements: Goal: Ability to maintain clinical measurements within normal limits will improve Outcome: Progressing Goal: Will remain free from infection Outcome: Progressing Goal: Diagnostic test results will improve Outcome: Progressing Goal: Respiratory complications will improve Outcome: Progressing Goal: Cardiovascular complication will be avoided Outcome: Progressing   Problem: Activity: Goal: Risk for activity intolerance will decrease Outcome: Progressing   Problem: Nutrition: Goal: Adequate nutrition will be maintained Outcome: Progressing   Problem: Coping: Goal: Level of anxiety will decrease Outcome: Progressing   Problem: Elimination: Goal: Will not experience complications related to bowel motility Outcome: Progressing Goal: Will not experience complications related to urinary retention Outcome: Progressing   Problem: Pain Managment: Goal: General experience of comfort will improve Outcome: Progressing   Problem: Safety: Goal: Ability to remain free from injury will improve Outcome: Progressing   Problem: Skin Integrity: Goal: Risk for impaired skin integrity will decrease Outcome: Progressing   Problem: Education: Goal: Knowledge of disease or condition will improve Outcome: Progressing Goal: Knowledge of secondary prevention will improve (MUST DOCUMENT ALL) Outcome: Progressing Goal: Knowledge of patient specific risk factors will improve  Loraine Leriche N/A or DELETE if not current risk factor) Outcome: Progressing   Problem: Ischemic Stroke/TIA Tissue Perfusion: Goal: Complications of ischemic stroke/TIA will be minimized Outcome: Progressing   Problem: Coping: Goal: Will verbalize positive feelings about self Outcome: Progressing Goal: Will identify appropriate support needs Outcome: Progressing   Problem: Health Behavior/Discharge Planning: Goal: Ability to manage health-related needs will improve Outcome: Progressing Goal: Goals will be collaboratively established with patient/family Outcome: Progressing   Problem: Self-Care: Goal: Ability to participate in self-care as condition permits will improve Outcome: Progressing Goal: Verbalization of feelings and concerns over difficulty with self-care will improve Outcome: Progressing Goal: Ability to communicate needs accurately will improve Outcome: Progressing   Problem: Nutrition: Goal: Risk of aspiration will decrease Outcome: Progressing Goal: Dietary intake will improve Outcome: Progressing   Problem: Education: Goal: Knowledge of disease or condition will improve Outcome: Progressing Goal: Knowledge of secondary prevention will improve (MUST DOCUMENT ALL) Outcome: Progressing Goal: Knowledge of patient specific risk factors will improve Loraine Leriche N/A or DELETE if not current risk factor) Outcome: Progressing   Problem: Intracerebral Hemorrhage Tissue Perfusion: Goal: Complications of Intracerebral Hemorrhage will be minimized Outcome: Progressing   Problem: Coping: Goal: Will verbalize positive feelings about self Outcome: Progressing Goal: Will identify appropriate support needs Outcome: Progressing   Problem: Health Behavior/Discharge Planning: Goal: Ability to manage health-related needs will improve Outcome: Progressing Goal: Goals will be collaboratively established with patient/family Outcome: Progressing   Problem: Self-Care: Goal: Ability to  participate in self-care as condition permits will improve Outcome: Progressing Goal: Verbalization of feelings and concerns over difficulty with self-care will improve Outcome: Progressing Goal: Ability to communicate needs accurately will improve Outcome: Progressing   Problem: Nutrition: Goal: Risk of aspiration will decrease Outcome: Progressing Goal: Dietary intake will improve Outcome: Progressing   Problem: Education: Goal: Ability to describe self-care measures that may prevent or decrease complications (Diabetes Survival Skills Education) will improve Outcome: Progressing Goal: Individualized Educational Video(s) Outcome: Progressing   Problem: Coping: Goal: Ability to adjust to condition or change  in health will improve Outcome: Progressing   Problem: Fluid Volume: Goal: Ability to maintain a balanced intake and output will improve Outcome: Progressing   Problem: Health Behavior/Discharge Planning: Goal: Ability to identify and utilize available resources and services will improve Outcome: Progressing Goal: Ability to manage health-related needs will improve Outcome: Progressing   Problem: Metabolic: Goal: Ability to maintain appropriate glucose levels will improve Outcome: Progressing

## 2023-06-08 NOTE — Progress Notes (Signed)
PT DC Note  Patient Details Name: Brian Dodson MRN: [id number] DOB: 1955-12-05   Cancelled Evaluation:    Reason Eval/Treat Not Completed: Other (comment) (Comfort care measures only.) PT to sign off.     Donnella Sham 06/08/2023, 8:13 AM [place], PT   Acute Rehabilitation Services  Office [phone] 06/08/2023

## 2023-06-08 NOTE — Progress Notes (Addendum)
STROKE TEAM PROGRESS NOTE   BRIEF HPI [name] is a 67 y.o. 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  Patient is from a facility and is a ward of the state with an appointed legal guardian.  LKW: 3 PM June 13, 2023 Thrombolytic given?: No, ICH IA performed?: No, ICH Premorbid modified rankin scale: 3 ICH score: 3 ICH volume: >60cc  SIGNIFICANT HOSPITAL EVENTS 06/13/23: during transport to [facility], concern for seizure activity (gaze deviation and head shaking described)--Versed given 7/19: NS consult--no intervention at this time Patient made DNR by county guardian over phone (Dr. Pearlean Brownie and Dr. Katrinka Blazing both confirmed), transition to comfort care once family is ready.   INTERIM HISTORY/SUBJECTIVE On comfort care.  No changes.   OBJECTIVE      CBC    Component Value Date/Time   WBC 4.1 Jun 13, 2023 1718   RBC 3.25 (L) June 13, 2023 1718   HGB 11.9 (L) 2023/06/13 2201   HGB 9.4 (L) 02/14/2015 1427   HCT 35.0 (L) June 13, 2023 2201   HCT 29.6 (L) 02/14/2015 1427   PLT 145 (L) 06/13/2023 1718   PLT 106 (L) 02/14/2015 1427   MCV 97.5 Jun 13, 2023 1718   MCV 101 (H) 02/14/2015 1427   MCH 32.6 2023-06-13 1718   MCHC 33.4 13-Jun-2023 1718   RDW 14.6 Jun 13, 2023 1718   RDW 15.3 (H) 02/14/2015 1427   LYMPHSABS 2.3 13-Jun-2023 1718   LYMPHSABS 2.8 02/14/2015 1427   MONOABS 0.3 13-Jun-2023 1718   MONOABS 0.5 02/14/2015 1427   EOSABS 0.2 06/13/2023 1718   EOSABS 0.1 02/14/2015 1427   BASOSABS 0.0 06-13-2023 1718   BASOSABS 0.0 02/14/2015 1427    BMET    Component Value Date/Time   NA 144 06/07/2023 0845   NA 138 02/14/2015 1427   K 4.7 2023/06/13 2201   K 4.3 02/14/2015 1427   CL 106 06-13-2023 1718   CL 104 02/14/2015 1427    CO2 25 Jun 13, 2023 1718   CO2 30 02/14/2015 1427   GLUCOSE 117 (H) 2023/06/13 1718   GLUCOSE 86 02/14/2015 1427   BUN 21 06-13-23 1718   BUN 33 (H) 02/14/2015 1427   CREATININE 1.10 13-Jun-2023 1718   CREATININE 1.12 02/14/2015 1427   CALCIUM 8.4 (L) 2023-06-13 1718   CALCIUM 9.0 02/14/2015 1427   GFRNONAA >60 06-13-23 1718   GFRNONAA >60 02/14/2015 1427    IMAGING past 24 hours No results found.  Vitals:   06/07/23 1600 06/07/23 1700 06/07/23 1819 06/07/23 2007  BP:   (!) 141/78 126/71  Pulse: (!) 55 (!) 54 (!) 51 62  Resp: 14 13 14 16   Temp: 99.7 F (37.6 C) 99.9 F (37.7 C) 99.6 F (37.6 C) 98.7 F (37.1 C)  TempSrc:   Axillary Oral  SpO2: 98% 98% 99% 100%     PHYSICAL EXAM General: resting comfortably. CV: SB on monitor Respiratory: non labored. GI: Abdomen soft and nontender   NEURO:  Comatose.  Eyes closed patient does not open eyes to noxious stimuli. Sluggish right corneal, brisk left corneal reflex. Positive cough and gag. Trace withdrawal in lower extremities, no response in upper extremities.    ASSESSMENT/PLAN  Intracerebral Hemorrhage with IV extension:  right parieto-temporal region Etiology:  likely hypertensive bleed  Code Stroke CT head: Approximate 65 cc parenchymal hematoma centered in the right parietotemporal region with evidence of intraventricular extension and likely early uncal herniation on the righ. Near-complete effacement of the right ventricular system.  CTA head & neck: No evidence of proximal occlusion or intracranial aneurysm.  Repeat CT 7/19 early AM: Worsening right cerebral hematoma with edema, clot measuring up to 10cm in length. Midline shift up to 14mm with interval left ventricular entrapment.   2D Echo: LVEF 55-60%, Mild LVH, Trivial MVR.  LDL 50 HgbA1c 6.2 VTE prophylaxis - SCDs Eliquis (apixaban) daily and aspirin 81mg  prior to admission, now on No antithrombotic due to ICH/IVH.  Therapy recommendations:   pending Disposition:  comfort care  Hypertension Atrial fibrillation CAD 2003 Home Meds: eliquis, amiodarone 200mg  Continue telemetry monitoring Eliquis on hold due to ICH/IVH Blood Pressure Goal: SBP less than 160   Hyperlipidemia Home meds:  lipitor 10mg  LDL 50, goal < 70 High intensity statin held due to lobar ICH  Diabetes type II Home meds:  metformin 500mg  BID HgbA1c 6.2, goal < 7.0 CBGs SSI  Tobacco Abuse Former smoker   Dysphagia Patient has post-stroke dysphagia, SLP consulted    Diet   Diet NPO time specified   Advance diet as tolerated  Other Stroke Risk Factors Obesity, BMI: 29.21, BMI >/= 30 associated with increased stroke risk, recommend weight loss, diet and exercise as appropriate.   Other Active Problems Vascular Dementia Home meds: Aricept, Cogentin   Hospital day # 2  Patient with a intracranial hemorrhage with IVH.  Now DNR/DNI.  On comfort care with morphine drip.  History of atrial fibrillation on Eliquis now being held.    To contact Stroke Continuity provider, please refer to [email or web address]. After hours, contact General Neurology

## 2023-06-08 NOTE — Progress Notes (Signed)
OT Cancellation Note  Patient Details Name: ALDRIN ENGELHARD MRN: [id number] DOB: 07-24-1956   Cancelled Treatment:    Reason Eval/Treat Not Completed: Other (comment) (OT orders received. Patient's chart reviewed. Pt is now comfort care. OT will sign off.)  Limmie Patricia, OTR/L,CBIS  Supplemental OT - [facility] and [facility] Secure Chat Preferred   06/08/2023, 7:31 AM

## 2023-06-08 NOTE — Progress Notes (Addendum)
   Palliative Medicine Inpatient Follow Up Note HPI: Brian Dodson is a 67 y.o. male with past medical history of atrial fibrillation on Eliquis, coronary artery disease, CKD, COPD, diabetes, hyperlipidemia, hypertension, schizophrenia, vascular dementia    Palliative care asked to get involved in the setting of ICH to help with goals of care conversations - family had already opted for full comfort measures. We plan to provide additional family support and symptom management  Today's Discussion 06/08/2023  *Please note that this is a verbal dictation therefore any spelling or grammatical errors are due to the "Dragon Medical One" system interpretation.  Chart reviewed inclusive of vital signs, progress notes, laboratory results, and diagnostic images.   I met with Brian Dodson at bedside this morning. His nephew Brian Dodson was present at bedside. We discussed the difficulties associated with caring for Brian Dodson and how it evolved into him needs a legal guardian. We further discussed his life and what a wonderful man he has been throughout it. He was exceptionally faithful and had a very strong belief in God. He goes on to share how devastated Brian Dodson was through the loss of his mother.  Created space and opportunity for patients nephew to explore thoughts feelings and fears regarding Brian Dodson's current medical situation.  We reviewed that Brian Dodson appears stable and it may be worthwhile to consider transition to an inpatient hospice home. Per patients nephew the one in the [place] area would be easiest for them to get to. I shared that we would reach out to the guardian for consent.   Questions and concerns addressed/Palliative Support Provided.  ____________________ Addendum:  A HIPAA compliant VM was left for patients legal guardian, Brian Dodson to proceed with hospice placement. Have not yet received a call back. _____________________ Addendum #2:  I spoke with Brian Dodson this late afternoon. She consents to  placement at an inpatient hospice home.   Objective Assessment: Vital Signs Vitals:   06/07/23 1819 06/07/23 2007  BP: (!) 141/78 126/71  Pulse: (!) 51 62  Resp: 14 16  Temp: 99.6 F (37.6 C) 98.7 F (37.1 C)  SpO2: 99% 100%    Intake/Output Summary (Last 24 hours) at 06/08/2023 1430 Last data filed at 06/08/2023 1218 Gross per 24 hour  Intake 122.66 ml  Output 1100 ml  Net -977.34 ml   Gen:  Elderly AA M in NAD HEENT: moist mucous membranes CV: Regular rate and rhythm  PULM: On RA, breathing is even and nonlabored ABD: soft/nontender  EXT: No edema  Neuro: Somonolent  SUMMARY OF RECOMMENDATIONS   DNAR/DNI   Comfort Care   Continue morphine gtt with titration PRN   Additional medications per [facility]   Unrestricted visitation   Ongoing PMT support  Billing based on MDM: High - Review of controlled substances ______________________________________________________________________________________ Lamarr Lulas [place] Palliative Medicine Team Team Cell Phone: [phone] Please utilize secure chat with additional questions, if there is no response within 30 minutes please call the above phone number  Palliative Medicine Team providers are available by phone from 7am to 7pm daily and can be reached through the team cell phone.  Should this patient require assistance outside of these hours, please call the patient's attending physician.

## 2023-06-08 NOTE — Progress Notes (Signed)
SLP Cancellation Note  Patient Details Name: EVERETT EHRLER MRN: [id number] DOB: [birth year]   Cancelled treatment:       Reason Eval/Treat Not Completed: Other (comment). Transition to comfort   Jalin Alicea, Riley Nearing 06/08/2023, 8:22 AM

## 2023-06-09 DIAGNOSIS — Z7189 Other specified counseling: Secondary | ICD-10-CM | POA: Diagnosis not present

## 2023-06-09 DIAGNOSIS — Z515 Encounter for palliative care: Secondary | ICD-10-CM | POA: Diagnosis not present

## 2023-06-09 DIAGNOSIS — I612 Nontraumatic intracerebral hemorrhage in hemisphere, unspecified: Secondary | ICD-10-CM | POA: Diagnosis not present

## 2023-06-09 NOTE — Progress Notes (Signed)
   Palliative Medicine Inpatient Follow Up Note HPI: Brian Dodson is a 67 y.o. male with past medical history of atrial fibrillation on Eliquis, coronary artery disease, CKD, COPD, diabetes, hyperlipidemia, hypertension, schizophrenia, vascular dementia    Palliative care asked to get involved in the setting of ICH to help with goals of care conversations - family had already opted for full comfort measures. We plan to provide additional family support and symptom management  Today's Discussion 06/09/2023  *Please note that this is a verbal dictation therefore any spelling or grammatical errors are due to the "Dragon Medical One" system interpretation.  Chart reviewed inclusive of vital signs, progress notes, laboratory results, and diagnostic images.   I met with Marina Goodell at bedside in the presence of his RN,. He has had a change as of early this morning and exemplifies cheyne stokes respirations. Per his RN she has had to increase the morphine gtt. I was able to provide a bolus at bedside for increased groaning. Patient noted to have cool distal digits.   Patient RN has called family to come in and offer support.  Patient will not longer transfer to inpatient hospice.   Objective Assessment: Vital Signs Vitals:   06/07/23 2007 06/08/23 1522  BP: 126/71 (!) 154/95  Pulse: 62 61  Resp: 16 20  Temp: 98.7 F (37.1 C) 99.8 F (37.7 C)  SpO2: 100% (!) 88%    Intake/Output Summary (Last 24 hours) at 06/09/2023 1610 Last data filed at 06/08/2023 9604 Gross per 24 hour  Intake 77.54 ml  Output 1450 ml  Net -1372.46 ml   Gen:  Elderly AA M in NAD HEENT: moist mucous membranes CV: Regular rate and rhythm  PULM: On RA, breathing is even and nonlabored ABD: soft/nontender  EXT: No edema  Neuro: Somonolent  SUMMARY OF RECOMMENDATIONS   DNAR/DNI   Comfort Care   Continue morphine gtt with titration PRN   Additional medications per [facility]   Unrestricted visitation   Ongoing PMT  support  Patient will pass in house, no longer stable for transfer  Billing based on MDM: High - Review of controlled substances ______________________________________________________________________________________ Lamarr Lulas [place] Palliative Medicine Team Team Cell Phone: [phone] Please utilize secure chat with additional questions, if there is no response within 30 minutes please call the above phone number  Palliative Medicine Team providers are available by phone from 7am to 7pm daily and can be reached through the team cell phone.  Should this patient require assistance outside of these hours, please call the patient's attending physician.

## 2023-06-09 NOTE — Progress Notes (Signed)
STROKE TEAM PROGRESS NOTE   BRIEF HPI Brian Dodson is a 67 y.o. male with history of Afib on Eliquis, CAD, COPD, DM, HLD, HTN, Vascular Dementia, Schizophrenia presenting to [facility] with altered mental status, where head CT revealed a large right lobar ICH with IV extension Reversed with Andexxa and started on Cleviprex for BP control, along with hypertonic saline, transferred to [facility].  Patient is from a facility and is a ward of the state with an appointed legal guardian.  LKW: 3 PM 7/18 Thrombolytic given?: No, ICH IA performed?: No, ICH Premorbid modified rankin scale: 3 ICH score: 3 ICH volume: >60cc  SIGNIFICANT HOSPITAL EVENTS 7/18: during transport to [facility], concern for seizure activity (gaze deviation and head shaking described)--Versed given 7/19: NS consult--no intervention at this time Patient made DNR by county guardian over phone (Dr. Pearlean Brownie and Dr. Katrinka Blazing both confirmed), transition to comfort care once family is ready.   INTERIM HISTORY/SUBJECTIVE On comfort care.  No changes. Family in room.   OBJECTIVE      CBC    Component Value Date/Time   WBC 4.1 06/05/2023 1718   RBC 3.25 (L) 06/05/2023 1718   HGB 11.9 (L) 06/19/2023 2201   HGB 9.4 (L) 02/14/2015 1427   HCT 35.0 (L) 05/25/2023 2201   HCT 29.6 (L) 02/14/2015 1427   PLT 145 (L) 05/25/2023 1718   PLT 106 (L) 02/14/2015 1427   MCV 97.5 06/14/2023 1718   MCV 101 (H) 02/14/2015 1427   MCH 32.6 05/25/2023 1718   MCHC 33.4 06/05/2023 1718   RDW 14.6 06/08/2023 1718   RDW 15.3 (H) 02/14/2015 1427   LYMPHSABS 2.3 05/22/2023 1718   LYMPHSABS 2.8 02/14/2015 1427   MONOABS 0.3 06/05/2023 1718   MONOABS 0.5 02/14/2015 1427   EOSABS 0.2 06/04/2023 1718   EOSABS 0.1 02/14/2015 1427   BASOSABS 0.0 06/12/2023 1718   BASOSABS 0.0 02/14/2015 1427    BMET    Component Value Date/Time   NA 144 06/07/2023 0845   NA 138 02/14/2015 1427   K 4.7 06/17/2023 2201   K 4.3 02/14/2015 1427   CL 106 05/28/2023 1718   CL 104  02/14/2015 1427   CO2 25 06/04/2023 1718   CO2 30 02/14/2015 1427   GLUCOSE 117 (H) 05/30/2023 1718   GLUCOSE 86 02/14/2015 1427   BUN 21 06/01/2023 1718   BUN 33 (H) 02/14/2015 1427   CREATININE 1.10 05/20/2023 1718   CREATININE 1.12 02/14/2015 1427   CALCIUM 8.4 (L) 06/05/2023 1718   CALCIUM 9.0 02/14/2015 1427   GFRNONAA >60 06/05/2023 1718   GFRNONAA >60 02/14/2015 1427    IMAGING past 24 hours No results found.  Vitals:   06/07/23 1700 06/07/23 1819 06/07/23 2007 06/08/23 1522  BP:  (!) 141/78 126/71 (!) 154/95  Pulse: (!) 54 (!) 51 62 61  Resp: 13 14 16 20   Temp: 99.9 F (37.7 C) 99.6 F (37.6 C) 98.7 F (37.1 C) 99.8 F (37.7 C)  TempSrc:  Axillary Oral Axillary  SpO2: 98% 99% 100% (!) 88%     PHYSICAL EXAM General:  resting comfortably. CV: SB on monitor Respiratory:   GI: Abdomen soft and nontender   NEURO: Comatose.  Eyes closed patient does not open eyes to noxious stimuli. Sluggish right corneal, brisk left corneal reflex. Positive cough and gag. Trace withdrawal in lower extremities, no response in upper extremities.    ASSESSMENT/PLAN  Intracerebral Hemorrhage with IV extension:  right parieto-temporal region Etiology:  likely  hypertensive bleed   Code Stroke CT head: Approximate 65 cc parenchymal hematoma centered in the right parietotemporal region with evidence of intraventricular extension and likely early uncal herniation on the righ. Near-complete effacement of the right ventricular system.  CTA head & neck: No evidence of proximal occlusion or intracranial aneurysm.  Repeat CT 7/19 early AM: Worsening right cerebral hematoma with edema, clot measuring up to 10cm in length. Midline shift up to 14mm with interval left ventricular entrapment.   2D Echo: LVEF 55-60%, Mild LVH, Trivial MVR.  LDL 50 HgbA1c 6.2 VTE prophylaxis - SCDs Eliquis (apixaban) daily and aspirin 81mg  prior to admission, now on No antithrombotic due to ICH/IVH.  Therapy  recommendations:  pending Disposition:  comfort care    Hospital day # 3  Patient with a intracranial hemorrhage with IVH.  DNR/DNI.  On comfort care with morphine drip.      To contact Stroke Continuity provider, please refer to [email or web address]. After hours, contact General Neurology

## 2023-06-09 NOTE — Plan of Care (Signed)
Problem: Education: Goal: Knowledge of General Education information will improve Description: Including pain rating scale, medication(s)/side effects and non-pharmacologic comfort measures Outcome: Progressing   Problem: Health Behavior/Discharge Planning: Goal: Ability to manage health-related needs will improve Outcome: Progressing   Problem: Clinical Measurements: Goal: Ability to maintain clinical measurements within normal limits will improve Outcome: Progressing Goal: Will remain free from infection Outcome: Progressing Goal: Diagnostic test results will improve Outcome: Progressing Goal: Respiratory complications will improve Outcome: Progressing Goal: Cardiovascular complication will be avoided Outcome: Progressing   Problem: Activity: Goal: Risk for activity intolerance will decrease Outcome: Progressing   Problem: Nutrition: Goal: Adequate nutrition will be maintained Outcome: Progressing   Problem: Coping: Goal: Level of anxiety will decrease Outcome: Progressing   Problem: Elimination: Goal: Will not experience complications related to bowel motility Outcome: Progressing Goal: Will not experience complications related to urinary retention Outcome: Progressing   Problem: Pain Managment: Goal: General experience of comfort will improve Outcome: Progressing   Problem: Safety: Goal: Ability to remain free from injury will improve Outcome: Progressing   Problem: Skin Integrity: Goal: Risk for impaired skin integrity will decrease Outcome: Progressing   Problem: Education: Goal: Knowledge of disease or condition will improve Outcome: Progressing Goal: Knowledge of secondary prevention will improve (MUST DOCUMENT ALL) Outcome: Progressing Goal: Knowledge of patient specific risk factors will improve Loraine Leriche N/A or DELETE if not current risk factor) Outcome: Progressing   Problem: Ischemic Stroke/TIA Tissue Perfusion: Goal: Complications of ischemic  stroke/TIA will be minimized Outcome: Progressing   Problem: Coping: Goal: Will verbalize positive feelings about self Outcome: Progressing Goal: Will identify appropriate support needs Outcome: Progressing   Problem: Health Behavior/Discharge Planning: Goal: Ability to manage health-related needs will improve Outcome: Progressing Goal: Goals will be collaboratively established with patient/family Outcome: Progressing   Problem: Self-Care: Goal: Ability to participate in self-care as condition permits will improve Outcome: Progressing Goal: Verbalization of feelings and concerns over difficulty with self-care will improve Outcome: Progressing Goal: Ability to communicate needs accurately will improve Outcome: Progressing   Problem: Nutrition: Goal: Risk of aspiration will decrease Outcome: Progressing Goal: Dietary intake will improve Outcome: Progressing   Problem: Education: Goal: Knowledge of disease or condition will improve Outcome: Progressing Goal: Knowledge of secondary prevention will improve (MUST DOCUMENT ALL) Outcome: Progressing Goal: Knowledge of patient specific risk factors will improve Loraine Leriche N/A or DELETE if not current risk factor) Outcome: Progressing   Problem: Intracerebral Hemorrhage Tissue Perfusion: Goal: Complications of Intracerebral Hemorrhage will be minimized Outcome: Progressing   Problem: Coping: Goal: Will verbalize positive feelings about self Outcome: Progressing Goal: Will identify appropriate support needs Outcome: Progressing   Problem: Health Behavior/Discharge Planning: Goal: Ability to manage health-related needs will improve Outcome: Progressing Goal: Goals will be collaboratively established with patient/family Outcome: Progressing   Problem: Self-Care: Goal: Ability to participate in self-care as condition permits will improve Outcome: Progressing Goal: Verbalization of feelings and concerns over difficulty with  self-care will improve Outcome: Progressing Goal: Ability to communicate needs accurately will improve Outcome: Progressing   Problem: Nutrition: Goal: Risk of aspiration will decrease Outcome: Progressing Goal: Dietary intake will improve Outcome: Progressing   Problem: Education: Goal: Ability to describe self-care measures that may prevent or decrease complications (Diabetes Survival Skills Education) will improve Outcome: Progressing Goal: Individualized Educational Video(s) Outcome: Progressing   Problem: Coping: Goal: Ability to adjust to condition or change in health will improve Outcome: Progressing   Problem: Fluid Volume: Goal: Ability to maintain a balanced intake and output will  improve Outcome: Progressing   Problem: Health Behavior/Discharge Planning: Goal: Ability to identify and utilize available resources and services will improve Outcome: Progressing Goal: Ability to manage health-related needs will improve Outcome: Progressing   Problem: Metabolic: Goal: Ability to maintain appropriate glucose levels will improve Outcome: Progressing

## 2023-06-12 MED FILL — Midazolam HCl Inj 5 MG/5ML (Base Equivalent): INTRAMUSCULAR | Qty: 2 | Status: AC

## 2023-06-20 NOTE — Progress Notes (Cosign Needed Addendum)
Wasted 13 ml of IV morphine in stericycle with [employer]

## 2023-06-20 NOTE — Death Summary Note (Addendum)
DEATH SUMMARY   Patient Details  Name: Brian Dodson MRN: [id number] DOB: [birth year]  Admission/Discharge Information   Admit Date:  06-26-23  Date of Death: Date of Death: 06/30/23  Time of Death: Time of Death: 0802  Length of Stay: 4  Referring Physician: Galvin Proffer, MD   Reason(s) for Hospitalization  Stroke  Diagnoses  Preliminary cause of death:  Secondary Diagnoses (including complications and co-morbidities):  Principal Problem:   ICH (intracerebral hemorrhage) (HCC) Active Problems:   Acute respiratory failure (HCC)   Seizure [facility])   Brief Hospital Course (including significant findings, care, treatment, and services provided and events leading to death)  Mr. DEWEL LOTTER is a 67 y.o. male with history of Afib on Eliquis, CAD, COPD, DM, HLD, HTN, Vascular Dementia, Schizophrenia presenting to [facility] with altered mental status, where head CT revealed a large right lobar ICH with IV extension Reversed with Andexxa and started on Cleviprex for BP control, along with hypertonic saline, transferred to [facility].  Patient is from a facility and is a ward of the state with an appointed legal guardian.  LKW: 3 PM 06/26/23 Thrombolytic given?: No, ICH IA performed?: No, ICH Premorbid modified rankin scale: 3 ICH score: 3 ICH volume: >60cc   SIGNIFICANT HOSPITAL EVENTS June 26, 2023: during transport to [facility], concern for seizure activity (gaze deviation and head shaking described)--Versed given 7/19: NS consult--no intervention at this time, transitioned to comfort care  Pertinent Labs and Studies  Significant Diagnostic Studies ECHOCARDIOGRAM COMPLETE  Result Date: 06/07/2023    ECHOCARDIOGRAM REPORT   Patient Name:   JAMARIAN JACINTO Date of Exam: 06/07/2023 Medical Rec #:  [id number]     Height:       76.0 in Accession #:    [id number]    Weight:       240.0 lb Date of Birth:  October 28, 1956    BSA:          2.394 m Patient Age:    66 years      BP:           143/85 mmHg Patient Gender: M              HR:           62 bpm. Exam Location:  Inpatient Procedure: 2D Echo, Cardiac Doppler and Color Doppler Indications:    Stroke I63.9  History:        Patient has prior history of Echocardiogram examinations, most                 recent 07/23/2021. CAD, Stroke and CKD; Intracranial Hemorrhage;                 Risk Factors:Dyslipidemia, Diabetes, Hypertension and Former                 Smoker.  Sonographer:    Dondra Prader RVT RCS Referring Phys: [id number] SRISHTI L BHAGAT IMPRESSIONS  1. Left ventricular ejection fraction, by estimation, is 55 to 60%. The left ventricle has normal function. The left ventricle has no regional wall motion abnormalities. There is mild concentric left ventricular hypertrophy. Left ventricular diastolic parameters were normal.  2. Right ventricular systolic function is normal. The right ventricular size is normal.  3. The mitral valve is normal in structure. Trivial mitral valve regurgitation. No evidence of mitral stenosis.  4. The aortic valve is tricuspid. There is mild calcification of the aortic valve. Aortic valve regurgitation is not visualized. Aortic valve sclerosis/calcification is  present, without any evidence of aortic stenosis.  5. The inferior vena cava is dilated in size with >50% respiratory variability, suggesting right atrial pressure of 8 mmHg. FINDINGS  Left Ventricle: Left ventricular ejection fraction, by estimation, is 55 to 60%. The left ventricle has normal function. The left ventricle has no regional wall motion abnormalities. The left ventricular internal cavity size was normal in size. There is  mild concentric left ventricular hypertrophy. Left ventricular diastolic parameters were normal. Right Ventricle: The right ventricular size is normal. No increase in right ventricular wall thickness. Right ventricular systolic function is normal. Left Atrium: Left atrial size was normal in size. Right Atrium: Right atrial size was normal in size. Pericardium: There is  no evidence of pericardial effusion. Mitral Valve: The mitral valve is normal in structure. Trivial mitral valve regurgitation. No evidence of mitral valve stenosis. Tricuspid Valve: The tricuspid valve is normal in structure. Tricuspid valve regurgitation is trivial. No evidence of tricuspid stenosis. Aortic Valve: The aortic valve is tricuspid. There is mild calcification of the aortic valve. Aortic valve regurgitation is not visualized. Aortic valve sclerosis/calcification is present, without any evidence of aortic stenosis. Aortic valve mean gradient measures 4.0 mmHg. Aortic valve peak gradient measures 7.1 mmHg. Aortic valve area, by VTI measures 4.20 cm. Pulmonic Valve: The pulmonic valve was normal in structure. Pulmonic valve regurgitation is trivial. No evidence of pulmonic stenosis. Aorta: The aortic root is normal in size and structure. Venous: The inferior vena cava is dilated in size with greater than 50% respiratory variability, suggesting right atrial pressure of 8 mmHg. IAS/Shunts: No atrial level shunt detected by color flow Doppler.  LEFT VENTRICLE PLAX 2D LVIDd:         5.20 cm   Diastology LVIDs:         3.40 cm   LV e' medial:    10.10 cm/s LV PW:         1.20 cm   LV E/e' medial:  11.3 LV IVS:        1.30 cm   LV e' lateral:   12.80 cm/s LVOT diam:     2.60 cm   LV E/e' lateral: 8.9 LV SV:         116 LV SV Index:   49 LVOT Area:     5.31 cm  RIGHT VENTRICLE          IVC RV Basal diam:  3.60 cm  IVC diam: 2.10 cm LEFT ATRIUM             Index        RIGHT ATRIUM           Index LA diam:        3.70 cm 1.55 cm/m   RA Area:     18.50 cm LA Vol (A2C):   62.7 ml 26.19 ml/m  RA Volume:   50.90 ml  21.26 ml/m LA Vol (A4C):   68.6 ml 28.66 ml/m LA Biplane Vol: 70.9 ml 29.62 ml/m  AORTIC VALVE                    PULMONIC VALVE AV Area (Vmax):    3.84 cm     PV Vmax:       1.23 m/s AV Area (Vmean):   3.85 cm     PV Peak grad:  6.1 mmHg AV Area (VTI):     4.20 cm AV Vmax:  133.00  cm/s AV Vmean:          87.000 cm/s AV VTI:            0.277 m AV Peak Grad:      7.1 mmHg AV Mean Grad:      4.0 mmHg LVOT Vmax:         96.10 cm/s LVOT Vmean:        63.100 cm/s LVOT VTI:          0.219 m LVOT/AV VTI ratio: 0.79  AORTA Ao Root diam: 3.60 cm Ao Asc diam:  3.60 cm MITRAL VALVE MV Area (PHT): 4.15 cm     SHUNTS MV Decel Time: 183 msec     Systemic VTI:  0.22 m MV E velocity: 114.00 cm/s  Systemic Diam: 2.60 cm MV A velocity: 61.30 cm/s MV E/A ratio:  1.86 Arvilla Meres MD Electronically signed by Arvilla Meres MD Signature Date/Time: 06/07/2023/10:16:24 AM    Final    CT HEAD WO CONTRAST ( )  Result Date: 06/07/2023 CLINICAL DATA:  Follow-up ICH EXAM: CT HEAD WITHOUT CONTRAST TECHNIQUE: Contiguous axial images were obtained from the base of the skull through the vertex without intravenous contrast. RADIATION DOSE REDUCTION: This exam was performed according to the departmental dose-optimization program which includes automated exposure control, adjustment of the mA and/or kV according to patient size and/or use of iterative reconstruction technique. COMPARISON:  Head CT from 1 day prior FINDINGS: Brain: Interval progression of hematoma centered in the right temporal lobe with occipital parietal extension, now measuring up to 10 cm anterior to posterior and 4 cm in thickness. Small volume intraventricular extension into the right lateral ventricle is unchanged. Progressive edema in the right cerebral hemisphere, superimposed on chronic small vessel ischemia. Leftward midline shift measures 14 mm and there is increased dilatation of the left lateral ventricle. Right uncal herniation. No new site of bleeding. Case obtained during [employer] downtime. Images were reviewed on the scanner without comparison and case discussed with Dr. Iver Nestle who was already aware. Vascular: No hyperdense vessel or unexpected calcification. Skull: Normal. Negative for fracture or focal lesion. Sinuses/Orbits: No  acute finding. IMPRESSION: Worsening right cerebral hematoma with edema, clot measuring up to 10 cm in length. Midline shift measures up to 14 mm and there is interval left lateral ventricular entrapment. Intraventricular blood clot remains small volume. Electronically Signed   By: Tiburcio Pea M.D.   On: 06/07/2023 06:27   DG Abd 1 View  Result Date: 06/18/2023 CLINICAL DATA:  Endotracheal tube placement. EXAM: ABDOMEN - 1 VIEW; PORTABLE CHEST - 1 VIEW COMPARISON:  06/09/2023. FINDINGS: The heart is normal in size and the mediastinal contour is within normal limits. No consolidation, effusion, or pneumothorax. An endotracheal tube terminates 2.3 cm above the carina. No acute osseous abnormality. The bowel gas pattern is normal in the visualized upper abdomen. The side port of the enteric tube is above the diaphragm and should be advanced 9 cm. IMPRESSION: 1. No acute cardiopulmonary process. 2. Side port of the enteric tube is above the level of the diaphragm and should be advanced 9 cm. 3. Endotracheal tube terminates 2.3 cm above the carina. Electronically Signed   By: Thornell Sartorius M.D.   On: 06/08/2023 22:18   DG CHEST PORT 1 VIEW  Result Date: 06/08/2023 CLINICAL DATA:  Endotracheal tube placement. EXAM: ABDOMEN - 1 VIEW; PORTABLE CHEST - 1 VIEW COMPARISON:  06/08/2023. FINDINGS: The heart is normal in size and the mediastinal contour  is within normal limits. No consolidation, effusion, or pneumothorax. An endotracheal tube terminates 2.3 cm above the carina. No acute osseous abnormality. The bowel gas pattern is normal in the visualized upper abdomen. The side port of the enteric tube is above the diaphragm and should be advanced 9 cm. IMPRESSION: 1. No acute cardiopulmonary process. 2. Side port of the enteric tube is above the level of the diaphragm and should be advanced 9 cm. 3. Endotracheal tube terminates 2.3 cm above the carina. Electronically Signed   By: Thornell Sartorius M.D.   On: 06/07/2023  22:18   DG Chest Portable 1 View  Result Date: 05/28/2023 CLINICAL DATA:  Intubated EXAM: PORTABLE CHEST 1 VIEW COMPARISON:  05/14/2023 FINDINGS: Endotracheal tube tip just above the right bronchus. Esophageal tube tip below the diaphragm but incompletely visualized. Low lung volumes. No acute airspace disease. Cardiomediastinal silhouette within normal limits IMPRESSION: Endotracheal tube tip just above the right bronchus. Low lung volumes without acute airspace disease Electronically Signed   By: Jasmine Pang M.D.   On: 05/29/2023 19:32   CT ANGIO HEAD CODE STROKE  Result Date: 06/03/2023 CLINICAL DATA:  Neuro deficit, acute, stroke suspected large R IPH, r/o aneurysm EXAM: CT ANGIOGRAPHY HEAD TECHNIQUE: Multidetector CT imaging of the head was performed using the standard protocol during bolus administration of intravenous contrast. Multiplanar CT image reconstructions and MIPs were obtained to evaluate the vascular anatomy. RADIATION DOSE REDUCTION: This exam was performed according to the departmental dose-optimization program which includes automated exposure control, adjustment of the mA and/or kV according to patient size and/or use of iterative reconstruction technique. CONTRAST:  75mL OMNIPAQUE IOHEXOL 350 MG/ML SOLN COMPARISON:  Same day CT head. CT head and neck angiogram 05/14/2023 FINDINGS: Same day CT head for intracranial findings, including description of the large right parietal temporal region parenchymal hematoma. There is no evidence of active extravasation. CTA HEAD Anterior circulation: No significant stenosis, proximal occlusion, aneurysm, or vascular malformation. Posterior circulation: No significant stenosis, proximal occlusion, aneurysm, or vascular malformation. Venous sinuses: Not opacified Anatomic variants: None Review of the MIP images confirms the above findings. IMPRESSION: 1. No evidence of proximal occlusion or intracranial aneurysm. 2. Same day CT head for intracranial  findings, including description of the large right parietotemporal region parenchymal hematoma. No evidence of active extravasation. Electronically Signed   By: Lorenza Cambridge M.D.   On: 06/01/2023 17:59   CT HEAD CODE STROKE WO CONTRAST  Result Date: 05/22/2023 CLINICAL DATA:  Code stroke.  Neuro deficit, acute, stroke suspected EXAM: CT HEAD WITHOUT CONTRAST TECHNIQUE: Contiguous axial images were obtained from the base of the skull through the vertex without intravenous contrast. RADIATION DOSE REDUCTION: This exam was performed according to the departmental dose-optimization program which includes automated exposure control, adjustment of the mA and/or kV according to patient size and/or use of iterative reconstruction technique. COMPARISON:  CT Head 05/14/23, MR head 05/14/23 FINDINGS: Brain: Large 7.0 x 3.9 x 4.8 cm parenchymal hematoma centered in the right parietal temporal region. There is evidence of intraventricular extension. There is near-complete effacement of the right lateral ventricular system. Size and shape of the ventricular system is otherwise unchanged from prior exam. No evidence of midline shift. There is likely early effacement of the basal cisterns on the right, suggestive of uncal herniation. No CT evidence of an acute cortical infarct. Vascular: No hyperdense vessel or unexpected calcification. Skull: Normal. Negative for fracture or focal lesion. Sinuses/Orbits: No middle ear or mastoid effusion. Paranasal sinuses are  clear. Orbits are unremarkable. Other: None IMPRESSION: 1. Approximate 65 cc parenchymal hematoma centered in the right parietotemporal region with evidence of intraventricular extension and likely early uncal herniation on the right. 2. Near-complete effacement of the right lateral ventricular system. Size and shape of the ventricular system is otherwise unchanged from prior exam. Findings were discussed with Dr. Selina Cooley on 06-17-2023 at 5:38 PM. Electronically Signed   By:  Lorenza Cambridge M.D.   On: 06/17/23 17:39   MR BRAIN WO CONTRAST  Result Date: 05/14/2023 CLINICAL DATA:  Altered mental status EXAM: MRI HEAD WITHOUT CONTRAST TECHNIQUE: Multiplanar, multiecho pulse sequences of the brain and surrounding structures were obtained without intravenous contrast. COMPARISON:  03/03/2021 MRI head, correlation is also made with 05/14/2023 CT head FINDINGS: Brain: No restricted diffusion to suggest acute or subacute infarct. No acute hemorrhage, mass, mass effect, or midline shift. No hydrocephalus or extra-axial collection. Partial empty sella. Normal craniocervical junction. Hemosiderin deposition in bilateral cerebral sulci (for example series 13, image 42, 28, 21), compatible with sequela of prior subarachnoid hemorrhage and superficial siderosis, some of which are new from the 2022 MRI. Additional areas of hemosiderin deposition in the left temporal lobe and left occipital lobe, which are new from the prior exam, with redemonstrated hemosiderin deposition in the vermis and right cerebellum. Advanced cerebral volume loss for age. This is most pronounced in the left temporal lobe. Confluent T2 hyperintense signal in the periventricular white matter, likely the sequela of moderate chronic small vessel ischemic disease. Vascular: Normal arterial flow voids. Skull and upper cervical spine: Normal marrow signal. Sinuses/Orbits: Clear paranasal sinuses. No acute finding in the orbits. Status post bilateral lens replacements. Other: The mastoid air cells are well aerated. IMPRESSION: 1. No acute intracranial process. 2. Hemosiderin deposition in bilateral cerebral sulci, compatible with sequela of prior subarachnoid hemorrhage and superficial siderosis, some of which are new from the 2022 MRI. Additional areas of hemosiderin deposition in the left temporal lobe and left occipital lobe are also new from the prior exam, consistent with interval hemorrhage. Electronically Signed   By: Wiliam Ke M.D.   On: 05/14/2023 20:05   DG Chest Portable 1 View  Result Date: 05/14/2023 CLINICAL DATA:  Shortness of breath EXAM: PORTABLE CHEST 1 VIEW COMPARISON:  X-ray 12/17/2022 and older FINDINGS: Underinflated x-ray. Borderline cardiopericardial silhouette. No consolidation, pneumothorax or effusion. No edema. Overlapping cardiac leads. Degenerative changes along the spine. IMPRESSION: No acute cardiopulmonary disease. Electronically Signed   By: Karen Kays M.D.   On: 05/14/2023 15:25   CT ANGIO HEAD NECK W WO CM (CODE STROKE)  Result Date: 05/14/2023 CLINICAL DATA:  Stroke. EXAM: CT ANGIOGRAPHY HEAD AND NECK WITH AND WITHOUT CONTRAST TECHNIQUE: Multidetector CT imaging of the head and neck was performed using the standard protocol during bolus administration of intravenous contrast. Multiplanar CT image reconstructions and MIPs were obtained to evaluate the vascular anatomy. Carotid stenosis measurements (when applicable) are obtained utilizing NASCET criteria, using the distal internal carotid diameter as the denominator. RADIATION DOSE REDUCTION: This exam was performed according to the departmental dose-optimization program which includes automated exposure control, adjustment of the mA and/or kV according to patient size and/or use of iterative reconstruction technique. CONTRAST:  50mL OMNIPAQUE IOHEXOL 350 MG/ML SOLN COMPARISON:  CTA head 01/29/2014.  MRI head 03/03/2021. FINDINGS: CTA NECK FINDINGS Aortic arch: Standard 3 vessel aortic arch with widely patent arch vessel origins. Right carotid system: Patent without evidence of stenosis or dissection. Left carotid system: Patent with  a small amount of calcified plaque at the carotid bifurcation. No evidence of a significant stenosis or dissection. Vertebral arteries: The right vertebral artery is patent and strongly dominant without evidence of stenosis or dissection. The left vertebral artery arises from the proximal most aspect of the left  subclavian artery and is diffusely hypoplastic but patent. 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 Moderate cervical spondylosis and asymmetric right-sided facet arthrosis. Other neck: No evidence of cervical lymphadenopathy or mass. Upper chest: Clear lung apices. Review of the MIP images confirms the above findings CTA HEAD FINDINGS Suboptimal arterial opacification limits assessment, particularly of the small and medium-sized arteries. Anterior circulation: The internal carotid arteries are patent from skull base to carotid termini with mild atherosclerotic plaque bilaterally and no suspected high-grade stenosis although assessment is limited by study quality. ACAs and MCAs are patent without evidence a proximal branch occlusion within study limitations. No aneurysm is identified. Posterior circulation: The intracranial vertebral arteries are patent to the basilar with the right being strongly dominant. The basilar artery is patent with an apparent punctate filling defect proximally attributed to artifact given similar findings in other similarly sized vessels (such as the right V4 segment). There are patent posterior communicating arteries bilaterally. The PCAs are patent with detailed assessment for stenosis precluded by poor study quality. No aneurysm is identified. Venous sinuses: As permitted by contrast timing, patent. Anatomic variants: None. Review of the MIP images confirms the above findings These results were communicated to Dr. Wilford Corner at 2:23 pm on 05/14/2023 by text page via the [facility] messaging system. IMPRESSION: 1. Suboptimal CTA without evidence of a large vessel occlusion. 2. Mild cervical carotid artery atherosclerosis without stenosis. Electronically Signed   By: Sebastian Ache M.D.   On: 05/14/2023 14:36   CT HEAD CODE STROKE WO CONTRAST  Result Date: 05/14/2023 CLINICAL DATA:  Code stroke. Neuro deficit, acute, stroke  suspected. Altered mental status. Right facial droop. EXAM: CT HEAD WITHOUT CONTRAST TECHNIQUE: Contiguous axial images were obtained from the base of the skull through the vertex without intravenous contrast. RADIATION DOSE REDUCTION: This exam was performed according to the departmental dose-optimization program which includes automated exposure control, adjustment of the mA and/or kV according to patient size and/or use of iterative reconstruction technique. COMPARISON:  Head CT 12/18/2022 FINDINGS: Brain: A small hypodensity in the left paracentral pons is favored to reflect skull base artifact on reformats. No acute supratentorial infarct, intracranial hemorrhage, mass, midline shift, or extra-axial fluid collection is identified. Patchy hypodensities in the cerebral white matter, greatest in the left parietal lobe, are unchanged and nonspecific but compatible with moderate chronic small vessel ischemic disease. There is mild-to-moderate cerebral atrophy including asymmetric volume loss in the left temporal lobe. Vascular: Calcified atherosclerosis at the skull base. No hyperdense vessel. Skull: No acute fracture or suspicious osseous lesion. Sinuses/Orbits: Paranasal sinuses and mastoid air cells are clear. Bilateral cataract extraction. Other: None. ASPECTS ([place]berta Stroke Program Early CT Score) - Ganglionic level infarction (caudate, lentiform nuclei, internal capsule, insula, M1-M3 cortex): 7 - Supraganglionic infarction (M4-M6 cortex): 3 Total score (0-10 with 10 being normal): 10 These results were communicated to Dr. Wilford Corner at 2:23 pm on 05/14/2023 by text page via the [facility] messaging system. IMPRESSION: 1. No evidence of acute intracranial abnormality. ASPECTS of 10. 2. Moderate chronic small vessel ischemic disease. Electronically Signed   By: Sebastian Ache M.D.   On: 05/14/2023 14:24  Microbiology Recent Results (from the past 240 hour(s))  MRSA Next Gen by PCR, Nasal     Status: None    Collection Time: 10-Jun-2023  9:35 PM   Specimen: Nasal Mucosa; Nasal Swab  Result Value Ref Range Status   MRSA by PCR Next Gen NOT DETECTED NOT DETECTED Final    Comment: (NOTE) The GeneXpert MRSA Assay (FDA approved for NASAL specimens only), is one component of a comprehensive MRSA colonization surveillance program. It is not intended to diagnose MRSA infection nor to guide or monitor treatment for MRSA infections. Test performance is not FDA approved in patients less than 80 years old. Performed at [facility], [address] [place], [place] [address]     Lab Basic Metabolic Panel: Recent Labs  Lab 05/27/2023 1718 Jun 10, 2023 2201 06/07/23 0504 06/07/23 0845  NA 135 140  139 141 144  K 4.6 4.7  --   --   CL 106  --   --   --   CO2 25  --   --   --   GLUCOSE 117*  --   --   --   BUN 21  --   --   --   CREATININE 1.10  --   --   --   CALCIUM 8.4*  --   --   --    Liver Function Tests: Recent Labs  Lab 10-Jun-2023 1718  AST 21  ALT 13  ALKPHOS 51  BILITOT 0.4  PROT 6.5  ALBUMIN 3.3*   No results for input(s): "LIPASE", "AMYLASE" in the last 168 hours. No results for input(s): "AMMONIA" in the last 168 hours. CBC: Recent Labs  Lab Jun 10, 2023 1718 06/13/2023 2201  WBC 4.1  --   NEUTROABS 1.3*  --   HGB 10.6* 11.9*  HCT 31.7* 35.0*  MCV 97.5  --   PLT 145*  --    Cardiac Enzymes: No results for input(s): "CKTOTAL", "CKMB", "CKMBINDEX", "TROPONINI" in the last 168 hours. Sepsis Labs: Recent Labs  Lab 05/24/2023 1718  WBC 4.1    Procedures/Operations  Intracerebral Hemorrhage with IV extension:  right parieto-temporal region Etiology:  likely hypertensive bleed   Code Stroke CT head: Approximate 65 cc parenchymal hematoma centered in the right parietotemporal region with evidence of intraventricular extension and likely early uncal herniation on the righ. Near-complete effacement of the right ventricular system.  CTA head & neck: No evidence of proximal  occlusion or intracranial aneurysm.  Repeat CT 7/19 early AM: Worsening right cerebral hematoma with edema, clot measuring up to 10cm in length. Midline shift up to 14mm with interval left ventricular entrapment.    2D Echo: LVEF 55-60%, Mild LVH, Trivial MVR.  LDL 50 HgbA1c 6.2 VTE prophylaxis - SCDs Eliquis (apixaban) daily and aspirin 81mg  prior to admission, now on No antithrombotic due to ICH/IVH.  Therapy recommendations:  pending Disposition:  comfort care   [facility] 06/05/2023, 8:21 AM

## 2023-06-20 NOTE — Progress Notes (Signed)
Patient passed away at 0802 this morning, Clinical research associate and second nurse Sharee Holster verified no pulse at this time. Family member at bedside nephew DeRon. Provider notified doctor Harolyn Rutherford, MD No patient's belongings present at bedside. Post mortem care performed. Family made arrangements with funeral home for pt to be picked up from the morgue.

## 2023-06-20 NOTE — Progress Notes (Signed)
Witnessed Waste of 13ml of IV morphine into stericycle with Tonna Corner RN

## 2023-06-20 DEATH — deceased
